# Patient Record
Sex: Male | Born: 1940 | Race: White | Hispanic: No | Smoking: Former smoker
Health system: Southern US, Community
[De-identification: ages and names within clinical notes are randomized; demographics above are authoritative.]

## PROBLEM LIST (undated history)

## (undated) ENCOUNTER — Inpatient Hospital Stay: Admission: EM | Payer: Self-pay | Source: Home / Self Care

## (undated) DIAGNOSIS — J189 Pneumonia, unspecified organism: Secondary | ICD-10-CM

## (undated) DIAGNOSIS — I10 Essential (primary) hypertension: Secondary | ICD-10-CM

## (undated) DIAGNOSIS — K449 Diaphragmatic hernia without obstruction or gangrene: Secondary | ICD-10-CM

## (undated) DIAGNOSIS — Z800 Family history of malignant neoplasm of digestive organs: Secondary | ICD-10-CM

## (undated) DIAGNOSIS — E785 Hyperlipidemia, unspecified: Secondary | ICD-10-CM

## (undated) DIAGNOSIS — I714 Abdominal aortic aneurysm, without rupture, unspecified: Secondary | ICD-10-CM

## (undated) DIAGNOSIS — T7840XA Allergy, unspecified, initial encounter: Secondary | ICD-10-CM

## (undated) DIAGNOSIS — M129 Arthropathy, unspecified: Secondary | ICD-10-CM

## (undated) DIAGNOSIS — F419 Anxiety disorder, unspecified: Secondary | ICD-10-CM

## (undated) DIAGNOSIS — I2510 Atherosclerotic heart disease of native coronary artery without angina pectoris: Secondary | ICD-10-CM

## (undated) DIAGNOSIS — K222 Esophageal obstruction: Secondary | ICD-10-CM

## (undated) DIAGNOSIS — J45909 Unspecified asthma, uncomplicated: Secondary | ICD-10-CM

## (undated) DIAGNOSIS — R911 Solitary pulmonary nodule: Secondary | ICD-10-CM

## (undated) DIAGNOSIS — J449 Chronic obstructive pulmonary disease, unspecified: Secondary | ICD-10-CM

## (undated) DIAGNOSIS — H269 Unspecified cataract: Secondary | ICD-10-CM

## (undated) DIAGNOSIS — I4891 Unspecified atrial fibrillation: Secondary | ICD-10-CM

## (undated) DIAGNOSIS — I739 Peripheral vascular disease, unspecified: Secondary | ICD-10-CM

## (undated) DIAGNOSIS — R011 Cardiac murmur, unspecified: Secondary | ICD-10-CM

## (undated) DIAGNOSIS — I499 Cardiac arrhythmia, unspecified: Secondary | ICD-10-CM

## (undated) DIAGNOSIS — M1990 Unspecified osteoarthritis, unspecified site: Secondary | ICD-10-CM

## (undated) DIAGNOSIS — K219 Gastro-esophageal reflux disease without esophagitis: Secondary | ICD-10-CM

## (undated) DIAGNOSIS — C61 Malignant neoplasm of prostate: Secondary | ICD-10-CM

## (undated) HISTORY — PX: ABDOMINAL AORTA STENT: SHX1108

## (undated) HISTORY — PX: FINGER SURGERY: SHX640

## (undated) HISTORY — DX: Diaphragmatic hernia without obstruction or gangrene: K44.9

## (undated) HISTORY — PX: UPPER GASTROINTESTINAL ENDOSCOPY: SHX188

## (undated) HISTORY — DX: Abdominal aortic aneurysm, without rupture: I71.4

## (undated) HISTORY — DX: Family history of malignant neoplasm of digestive organs: Z80.0

## (undated) HISTORY — DX: Gastro-esophageal reflux disease without esophagitis: K21.9

## (undated) HISTORY — DX: Peripheral vascular disease, unspecified: I73.9

## (undated) HISTORY — DX: Esophageal obstruction: K22.2

## (undated) HISTORY — PX: CARDIAC CATHETERIZATION: SHX172

## (undated) HISTORY — DX: Malignant neoplasm of prostate: C61

## (undated) HISTORY — DX: Atherosclerotic heart disease of native coronary artery without angina pectoris: I25.10

## (undated) HISTORY — DX: Hyperlipidemia, unspecified: E78.5

## (undated) HISTORY — DX: Unspecified osteoarthritis, unspecified site: M19.90

## (undated) HISTORY — DX: Anxiety disorder, unspecified: F41.9

## (undated) HISTORY — DX: Chronic obstructive pulmonary disease, unspecified: J44.9

## (undated) HISTORY — DX: Allergy, unspecified, initial encounter: T78.40XA

## (undated) HISTORY — DX: Unspecified asthma, uncomplicated: J45.909

## (undated) HISTORY — DX: Essential (primary) hypertension: I10

## (undated) HISTORY — DX: Cardiac murmur, unspecified: R01.1

## (undated) HISTORY — DX: Unspecified cataract: H26.9

## (undated) HISTORY — DX: Unspecified atrial fibrillation: I48.91

## (undated) HISTORY — PX: COLONOSCOPY: SHX174

## (undated) HISTORY — PX: INSERTION PROSTATE RADIATION SEED: SUR718

## (undated) HISTORY — DX: Abdominal aortic aneurysm, without rupture, unspecified: I71.40

## (undated) HISTORY — DX: Arthropathy, unspecified: M12.9

---

## 1999-07-27 ENCOUNTER — Encounter: Payer: Self-pay | Admitting: Emergency Medicine

## 1999-07-27 ENCOUNTER — Emergency Department (HOSPITAL_COMMUNITY): Admission: EM | Admit: 1999-07-27 | Discharge: 1999-07-27 | Payer: Self-pay | Admitting: Emergency Medicine

## 1999-09-23 ENCOUNTER — Ambulatory Visit (HOSPITAL_BASED_OUTPATIENT_CLINIC_OR_DEPARTMENT_OTHER): Admission: RE | Admit: 1999-09-23 | Discharge: 1999-09-23 | Payer: Self-pay | Admitting: Orthopedic Surgery

## 1999-09-23 ENCOUNTER — Encounter: Payer: Self-pay | Admitting: Emergency Medicine

## 2004-07-19 ENCOUNTER — Emergency Department (HOSPITAL_COMMUNITY): Admission: EM | Admit: 2004-07-19 | Discharge: 2004-07-19 | Payer: Self-pay | Admitting: *Deleted

## 2004-07-19 IMAGING — CR DG CHEST 2V
2 series · 2 of 2 positions shown · non-contrast
Comparison: none

CLINICAL DATA: Prostate carcinoma.  Chest and neck pain.  
 CHEST - TWO VIEWS: 
 The heart size and mediastinal contours are normal.  The lungs are clear.  The visualized skeleton is unremarkable.

[view not recorded (1 of 2)]
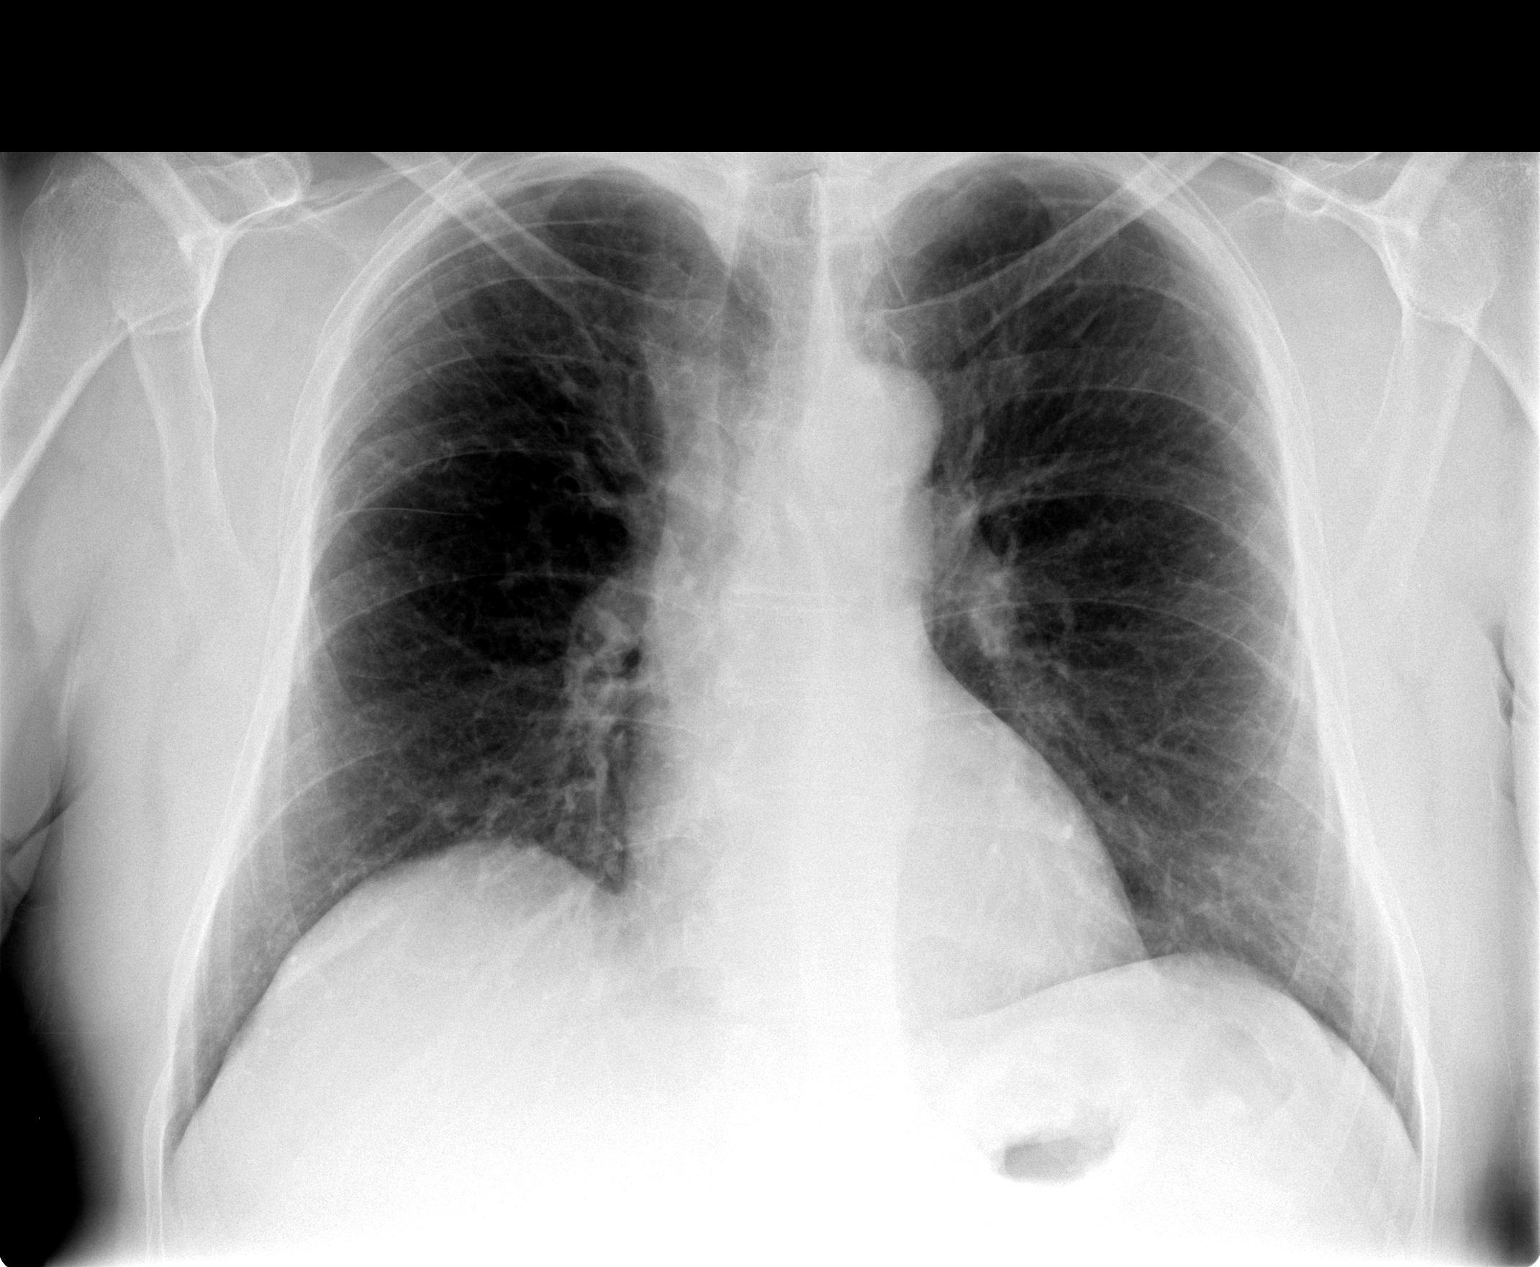

[view not recorded (2 of 2)]
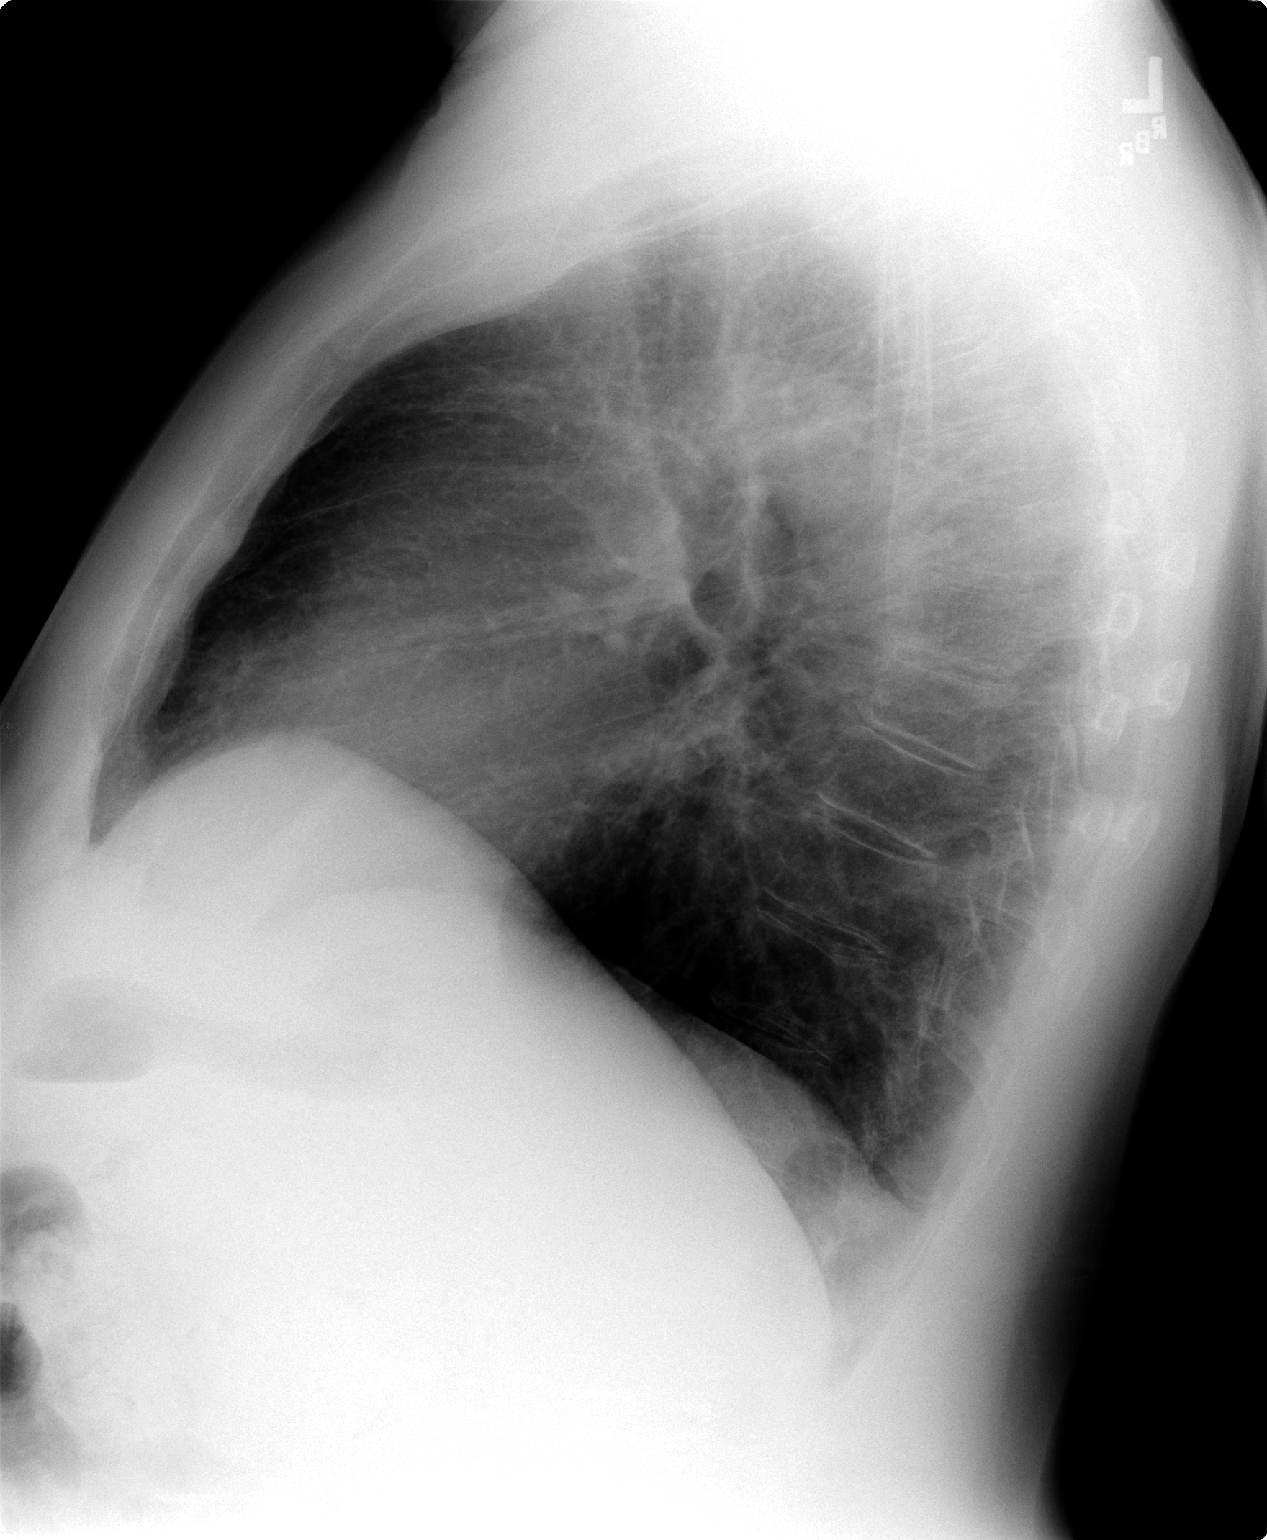

[2 of 2 positions shown; findings below may reference images not displayed]

IMPRESSION: No active disease.

## 2004-07-19 IMAGING — CT CT NECK W/ CM
1 series · 12 of 14 positions shown, 15 images · IV contrast (omnipaque)
Comparison: none

CLINICAL DATA: Left neck swelling and pain in the left parotid area.  Prostate carcinoma. 
 NECK CT WITH CONTRAST:
TECHNIQUE: Routine multidetector CT imaging of the neck was performed during administration of 100 cc Omnipaque 300 intravenous contrast.  
 The parotid glands and other salivary glands are normal in appearance.  There is no evidence of abnormal soft tissue masses.  No lymphadenopathy is identified within the neck.  There is no evidence of inflammatory process.  
 The laryngeal structures are normal in appearance as are the parapharyngeal soft tissues.  The thyroid gland is also normal in appearance.

[Series 2: soft tissue neck · axial · 0.45mm/px · z∈[-277,-59]mm · 12 of 70 slices shown, 15 images]
[im 6/70  soft-tissue]
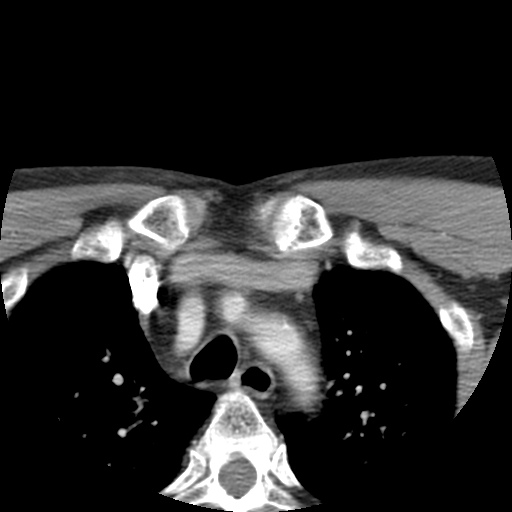
[im 6/70  bone]
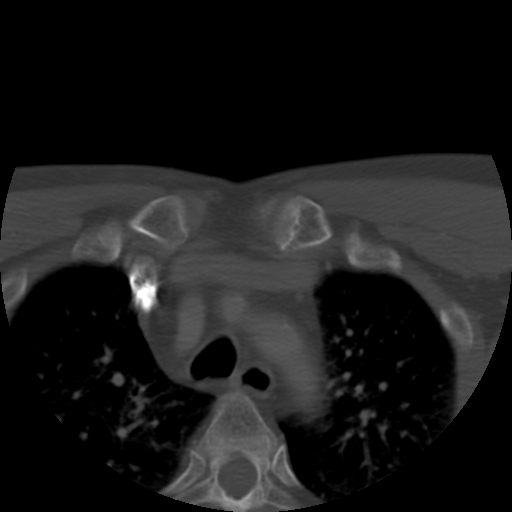
[im 11/70  bone]
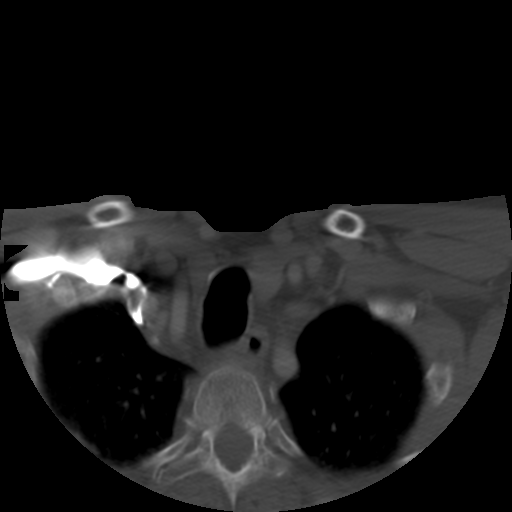
[im 16/70  bone]
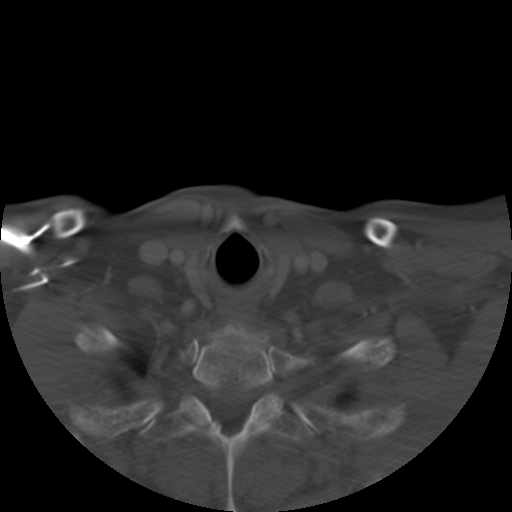
[im 22/70  bone]
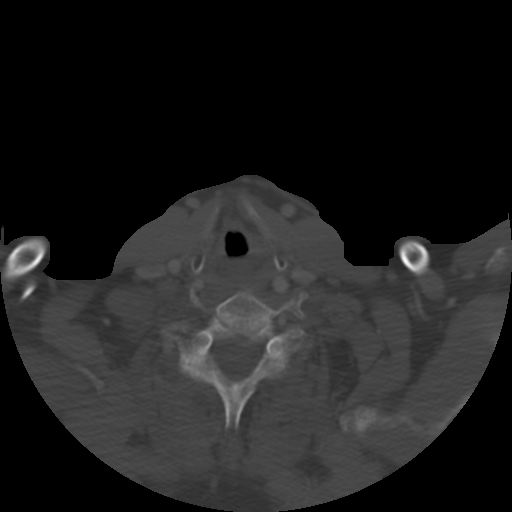
[im 27/70  soft-tissue]
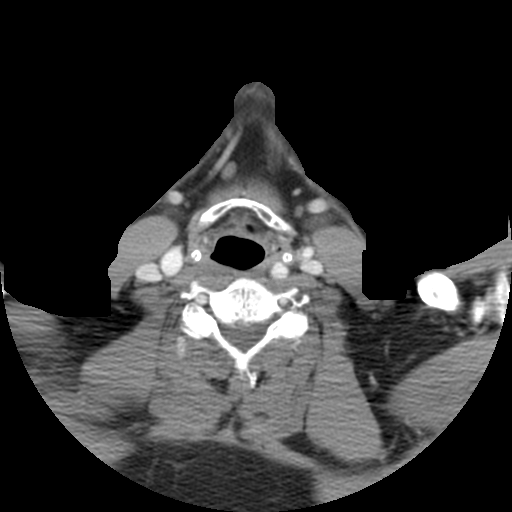
[im 27/70  bone]
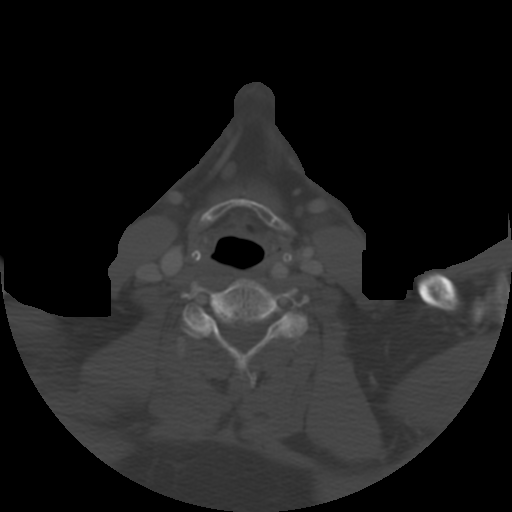
[im 32/70  bone]
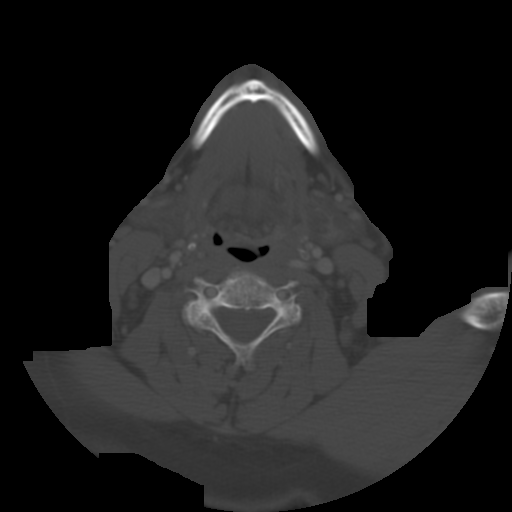
[im 38/70  bone]
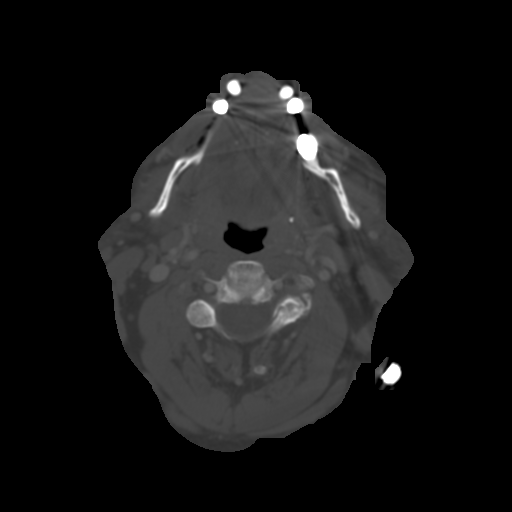
[im 43/70  bone]
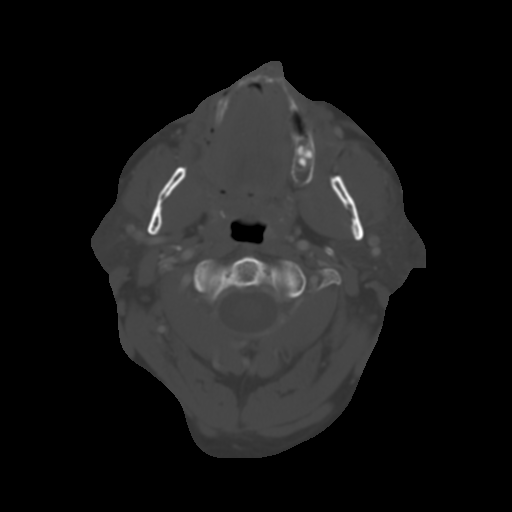
[im 48/70  soft-tissue]
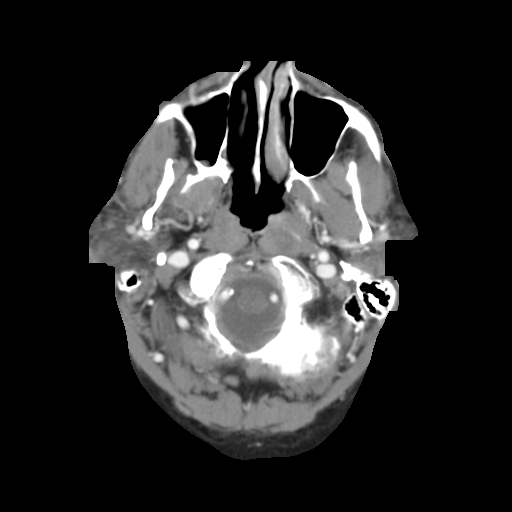
[im 48/70  bone]
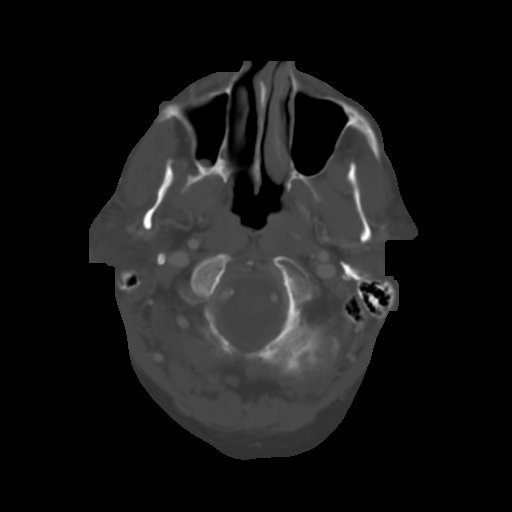
[im 54/70  bone]
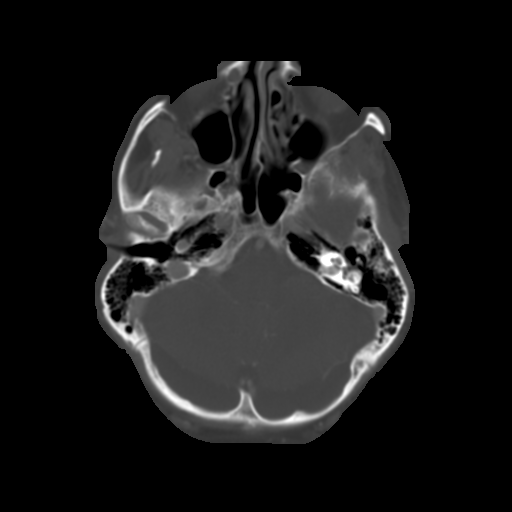
[im 59/70  bone]
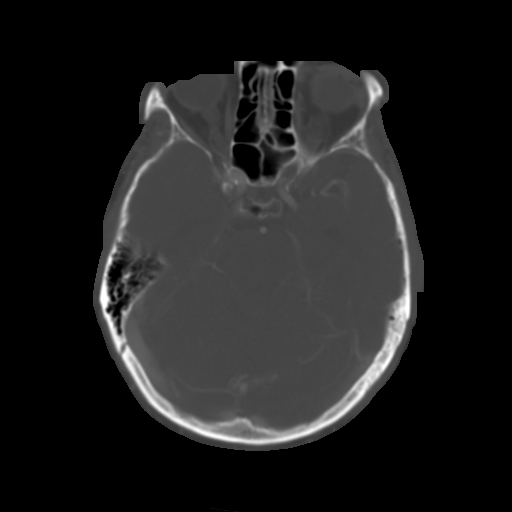
[im 64/70  bone]
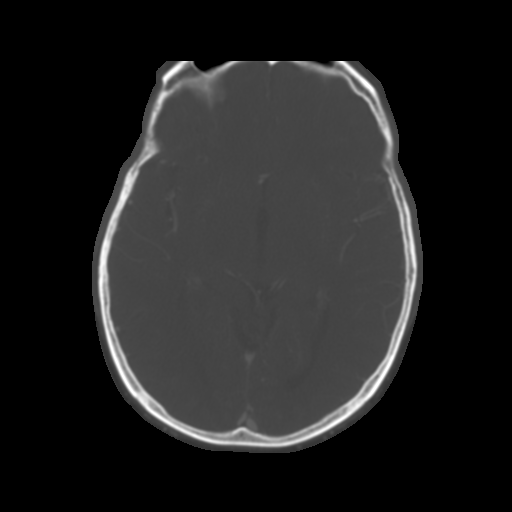

[12 of 14 positions shown; findings below may reference images not displayed]

IMPRESSION: Negative neck CT.  No evidence of mass or adenopathy.

## 2004-07-24 ENCOUNTER — Other Ambulatory Visit: Admission: RE | Admit: 2004-07-24 | Discharge: 2004-07-24 | Payer: Self-pay | Admitting: *Deleted

## 2004-07-25 ENCOUNTER — Ambulatory Visit: Admission: RE | Admit: 2004-07-25 | Discharge: 2004-10-16 | Payer: Self-pay | Admitting: Radiation Oncology

## 2004-09-08 ENCOUNTER — Ambulatory Visit (HOSPITAL_COMMUNITY): Admission: RE | Admit: 2004-09-08 | Discharge: 2004-09-08 | Payer: Self-pay | Admitting: Urology

## 2004-09-08 ENCOUNTER — Ambulatory Visit (HOSPITAL_BASED_OUTPATIENT_CLINIC_OR_DEPARTMENT_OTHER): Admission: RE | Admit: 2004-09-08 | Discharge: 2004-09-08 | Payer: Self-pay | Admitting: Urology

## 2005-01-06 ENCOUNTER — Ambulatory Visit: Payer: Self-pay | Admitting: Gastroenterology

## 2005-02-17 ENCOUNTER — Ambulatory Visit: Payer: Self-pay | Admitting: Gastroenterology

## 2005-03-16 ENCOUNTER — Ambulatory Visit: Payer: Self-pay | Admitting: Gastroenterology

## 2006-01-21 ENCOUNTER — Ambulatory Visit: Payer: Self-pay | Admitting: Gastroenterology

## 2006-01-22 ENCOUNTER — Ambulatory Visit: Payer: Self-pay | Admitting: Gastroenterology

## 2006-01-22 IMAGING — US US ABDOMEN COMPLETE
1 series · 14 of 25 positions shown · non-contrast
Comparison: none

ACCESSION NUMBER:  [PHONE_NUMBER].

ORDERING PHYSICIAN:  THOLENTZ, M.D.
READING PHYSICIAN:  THOLENTZ, M.D.
RESULTS:  Abdominal aorta measures 3.6 cm in maximal diameter with aneurysmal dilatation in the mid portion. The abdominal aorta appears normal.  The IVC is patent. 
The pancreas is not imaged due to overlying gas. 
Gallbladder is well distended, wall thickness 2.2 mm, with no pericholecystic fluid or intraluminal echogenic foci to suggest gallstone disease. The common bile duct measures 3.9 mm in maximal diameter and appears normal without evidence of intraluminal foci. 
The liver appears normal without evidence of parenchymal lesion, ductal dilatation or vascular abnormality. It has a moderate increase in echo-density. 
Kidneys measure: 9.3 cm right, 12 cm left.  Both kidneys are normal in appearance. 
The spleen is normal in size, measuring 11.2 cm without parenchymal lesion.

[Series 1: us abdomen complete · 0.24mm/px · 14 of 47 slices shown]
[im 1/47]
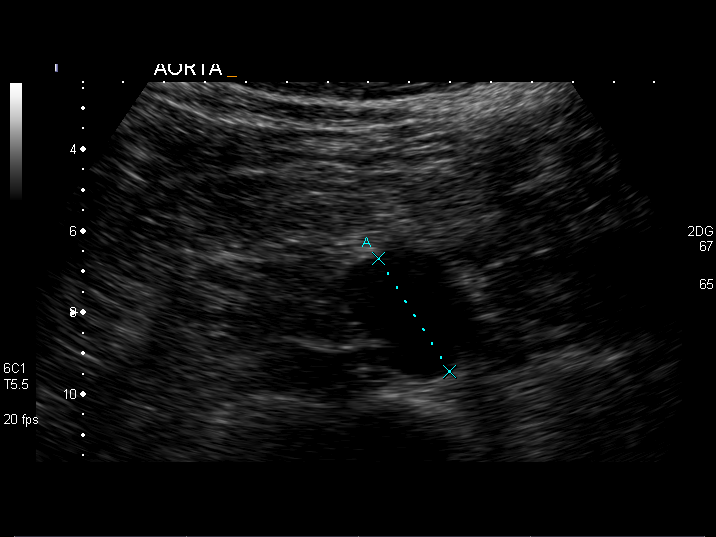
[im 4/47]
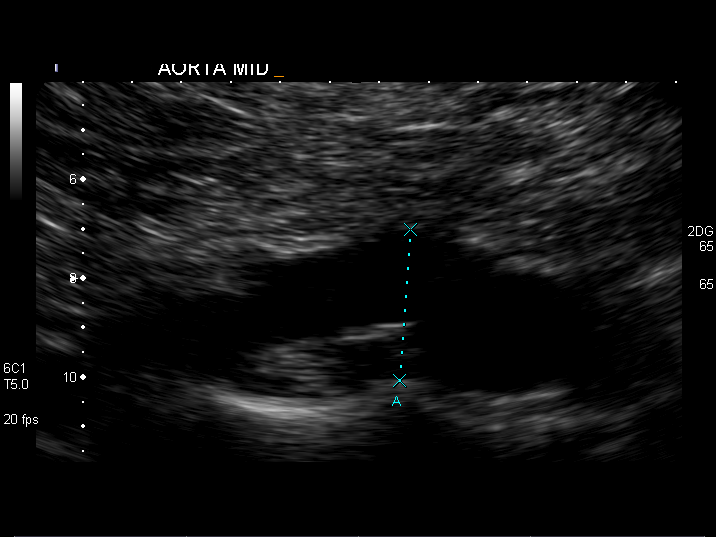
[im 8/47]
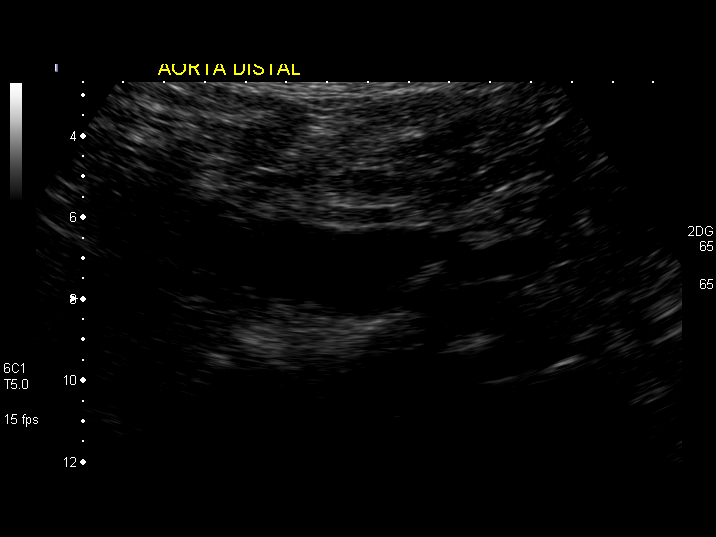
[im 12/47]
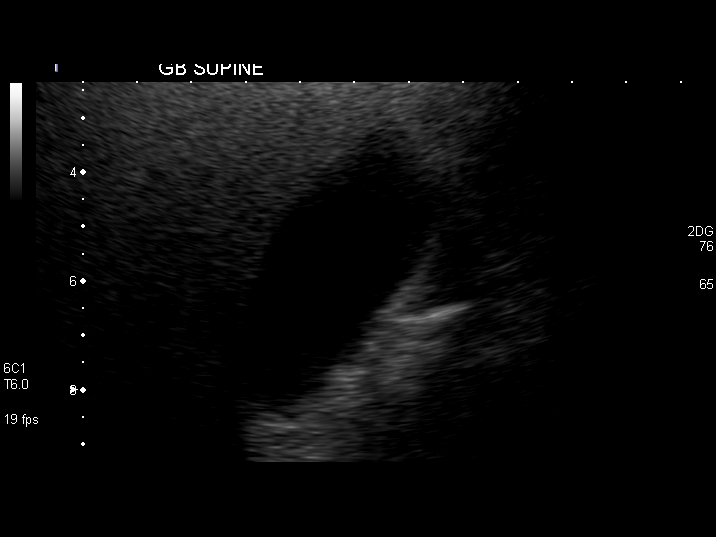
[im 16/47]
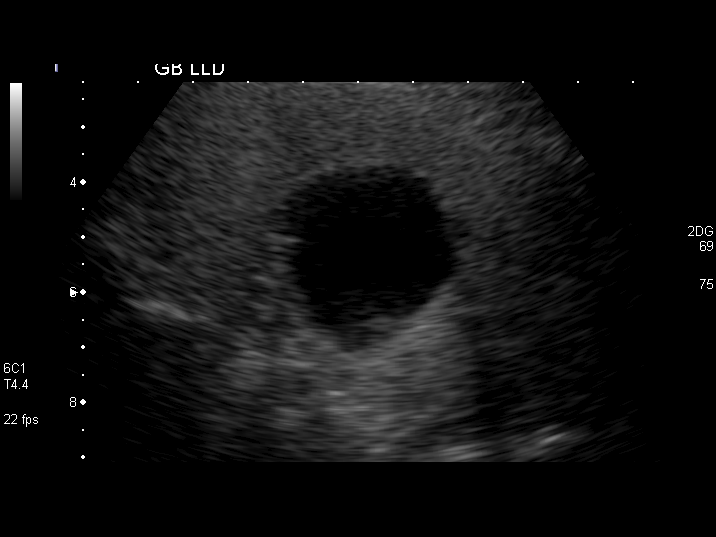
[im 18/47]
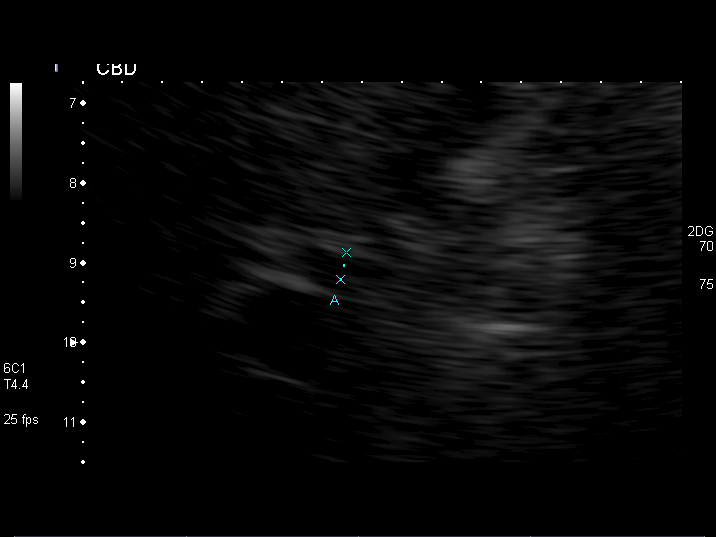
[im 22/47]
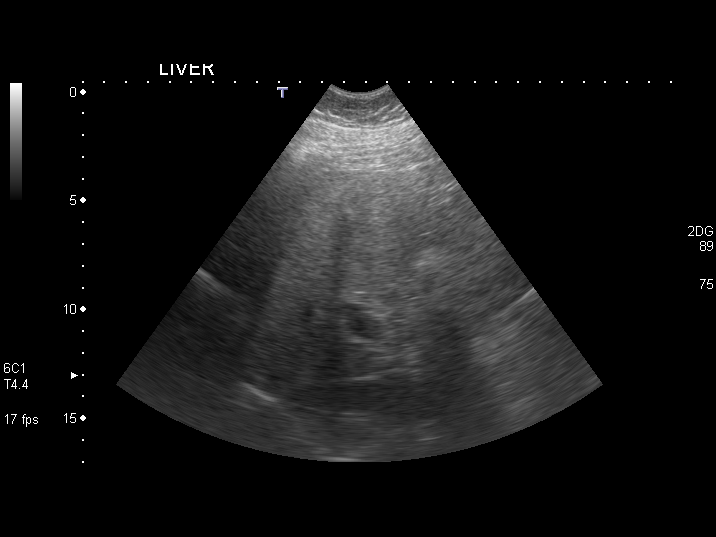
[im 25/47]
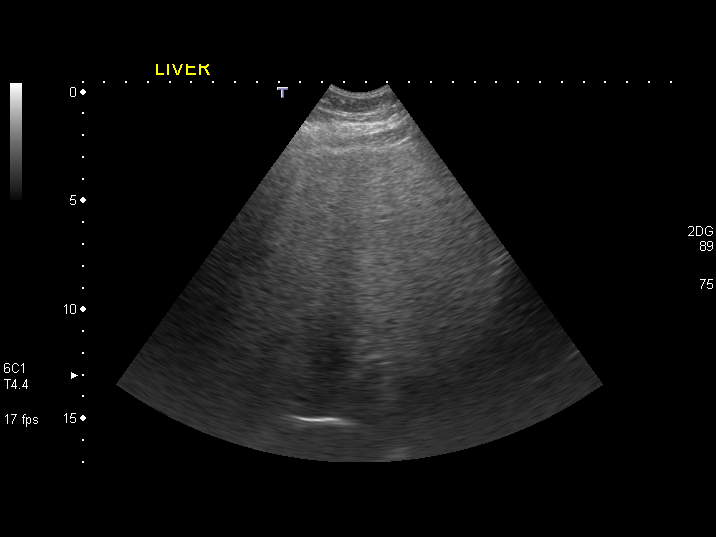
[im 29/47]
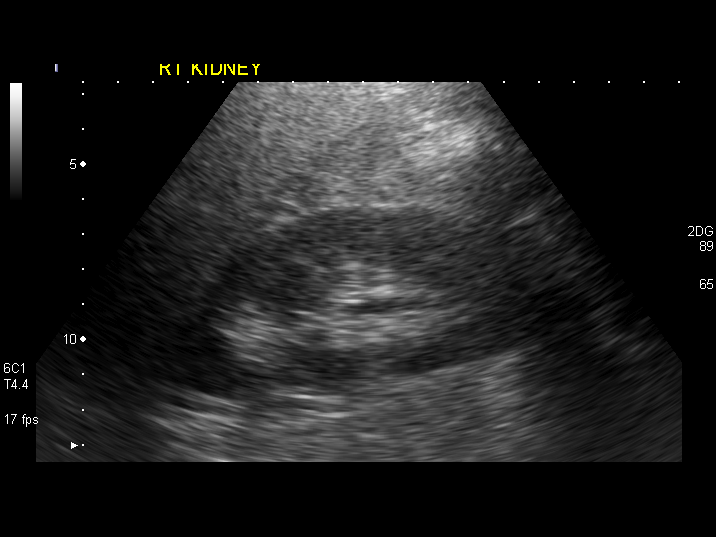
[im 31/47]
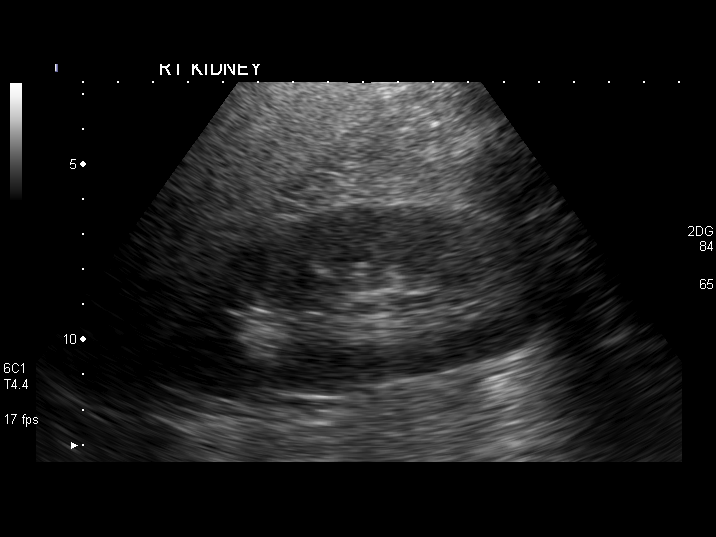
[im 35/47]
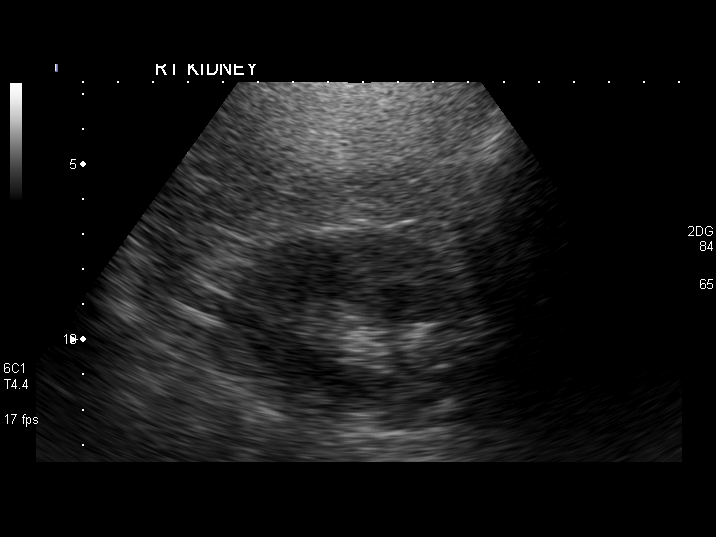
[im 39/47]
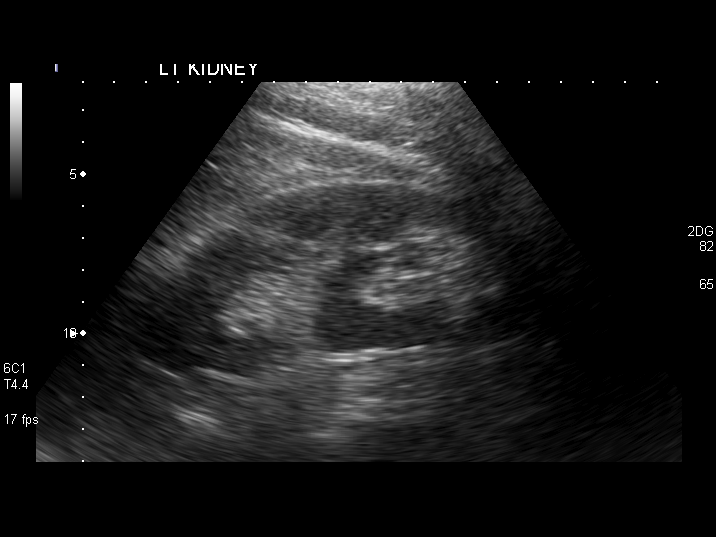
[im 43/47]
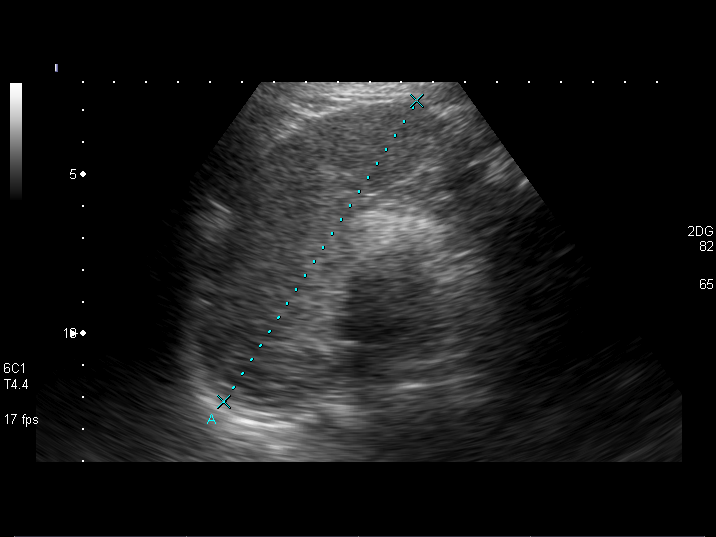
[im 47/47]
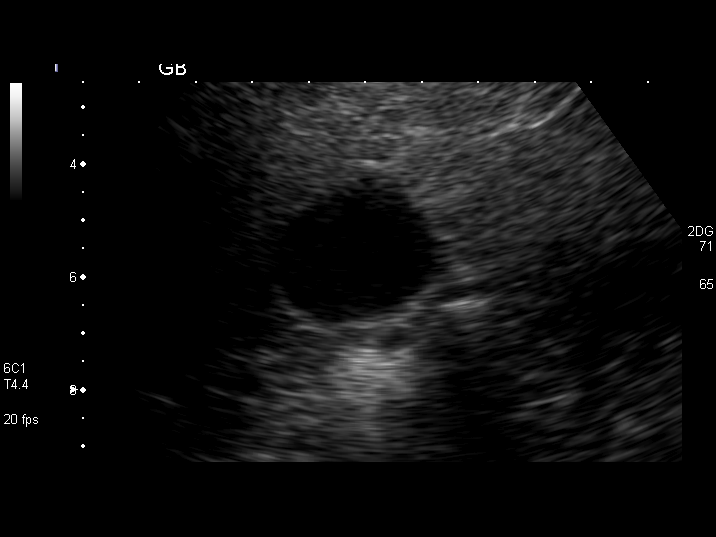

[14 of 25 positions shown; findings below may reference images not displayed]

IMPRESSION: 1.        Abdominal aortic aneurysm, measuring 3.6 cm.
2.        Moderately increased hepatic echodensity.
3.        Pancreas not imaged due to overlying gas.

## 2006-02-09 ENCOUNTER — Encounter: Admission: RE | Admit: 2006-02-09 | Discharge: 2006-02-09 | Payer: Self-pay | Admitting: Family Medicine

## 2006-02-09 IMAGING — US US AORTA
1 series · 14 of 25 positions shown · non-contrast
Comparison: [DATE].

CLINICAL DATA: Abdominal aortic aneurysm demonstrated at [facility] on [DATE]. 
 AORTIC ULTRASOUND:
TECHNIQUE: Complete retroperitoneal ultrasound examination was performed including evaluation of the abdominal aorta, IVC, common iliac vessel origins, and kidneys.

[Series 1: us aorta · 0.29mm/px · 14 of 46 slices shown]
[im 1/46]
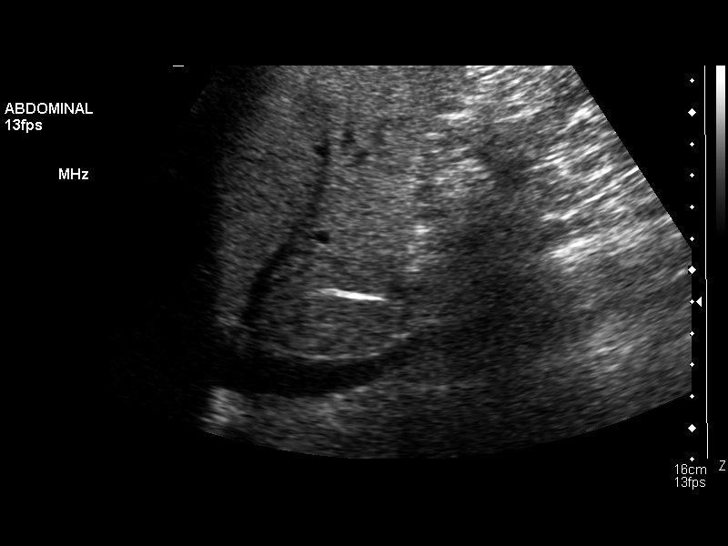
[im 4/46]
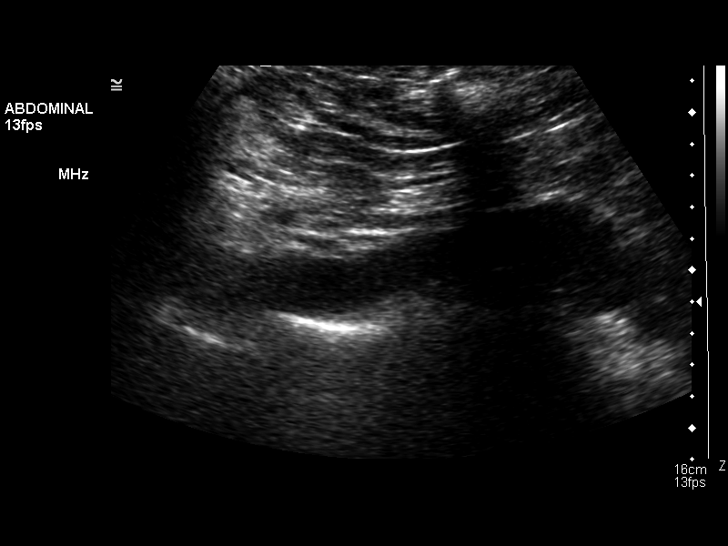
[im 8/46]
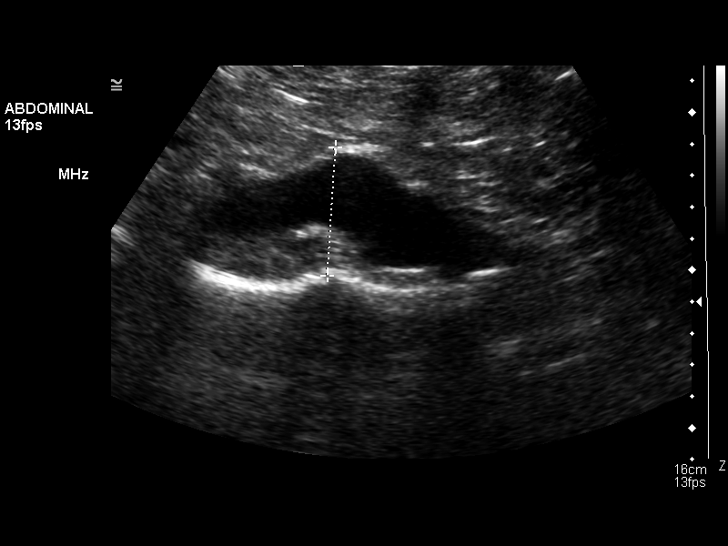
[im 12/46]
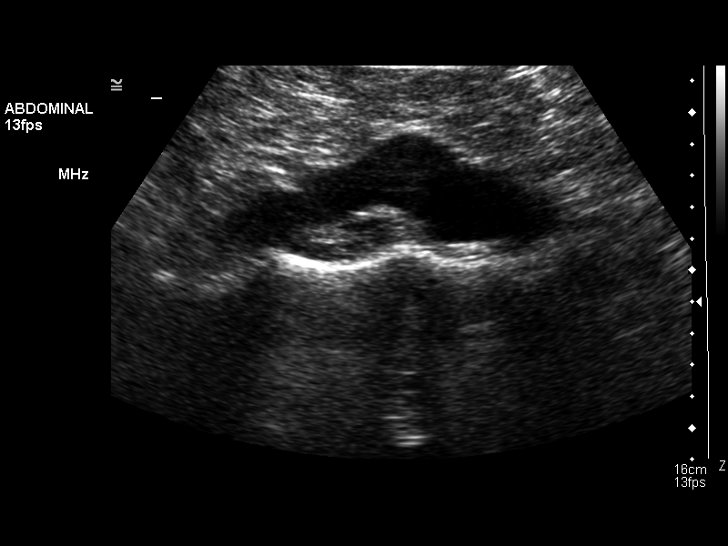
[im 16/46]
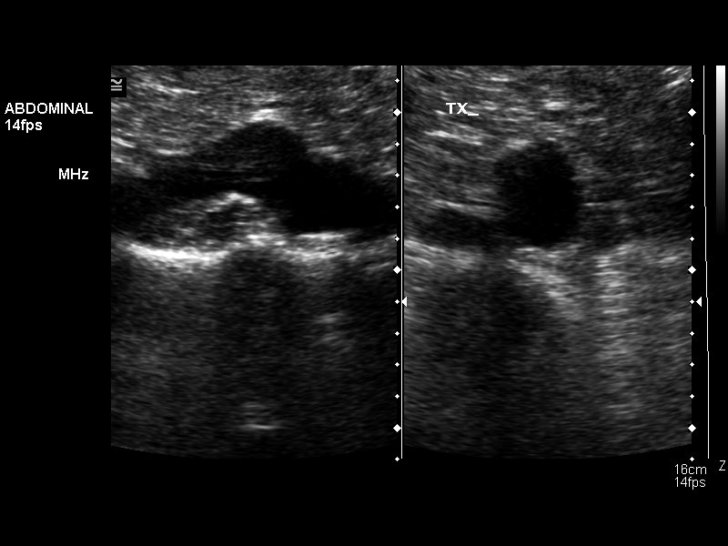
[im 17/46]
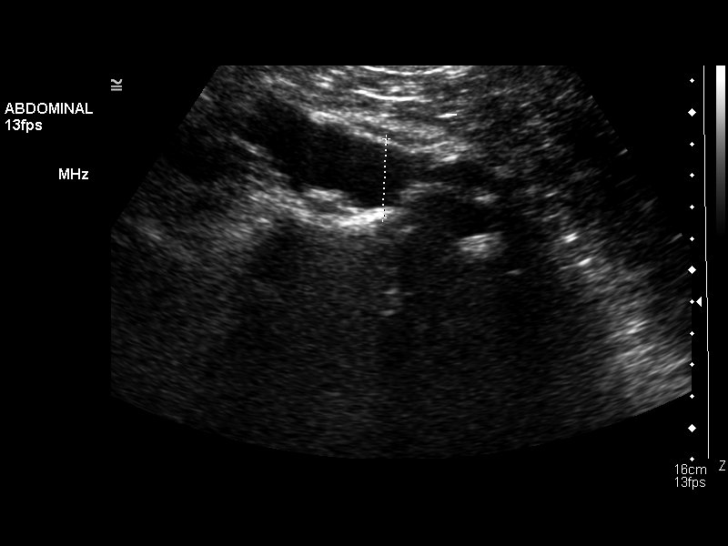
[im 21/46]
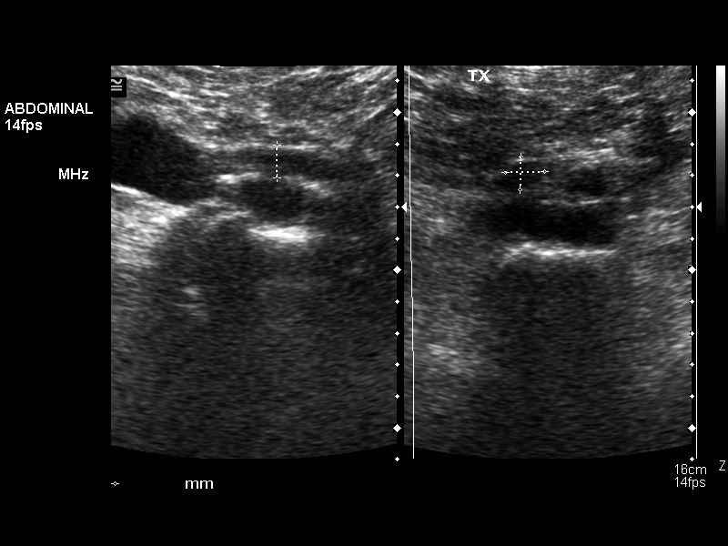
[im 25/46]
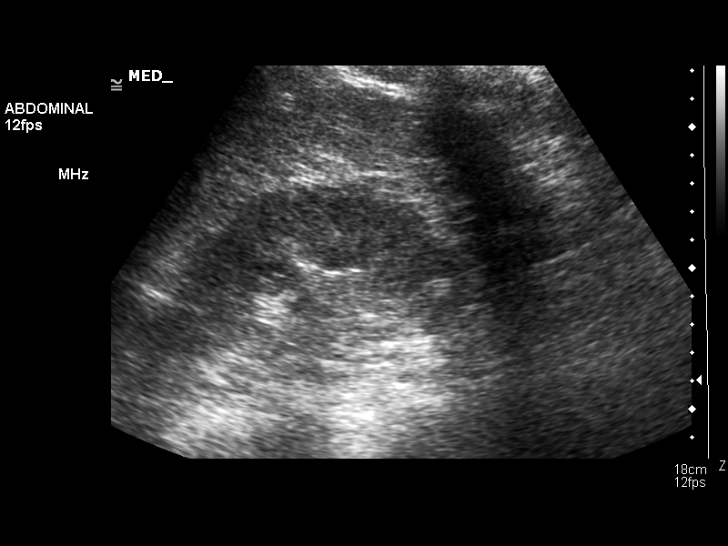
[im 29/46]
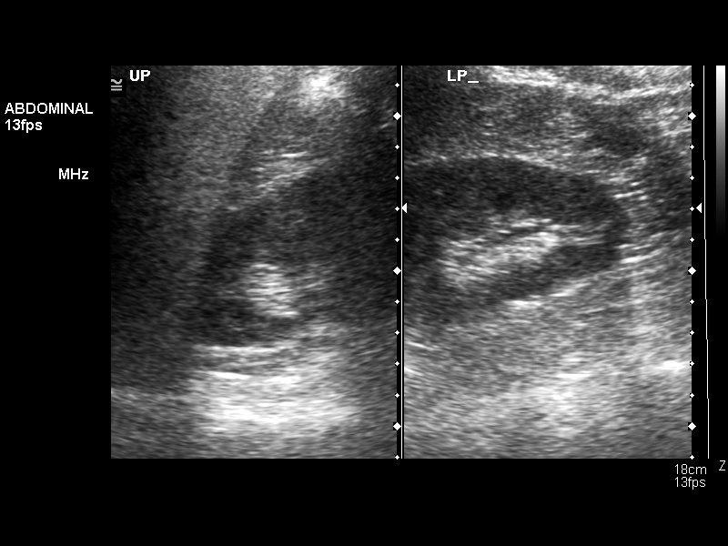
[im 31/46]
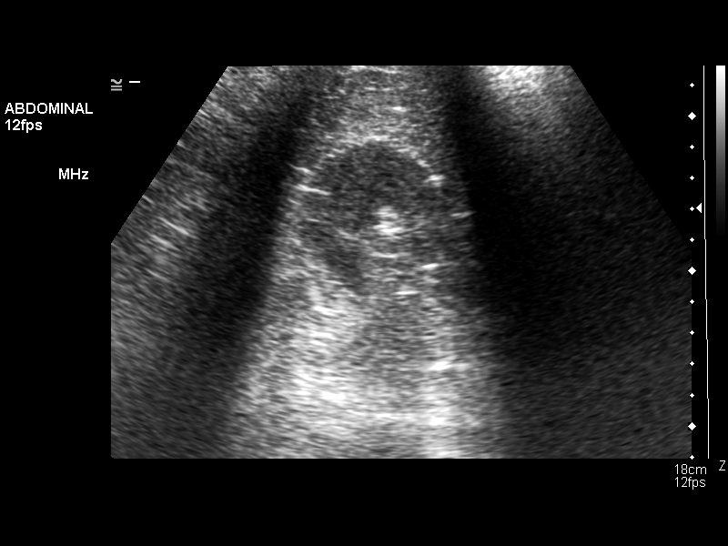
[im 34/46]
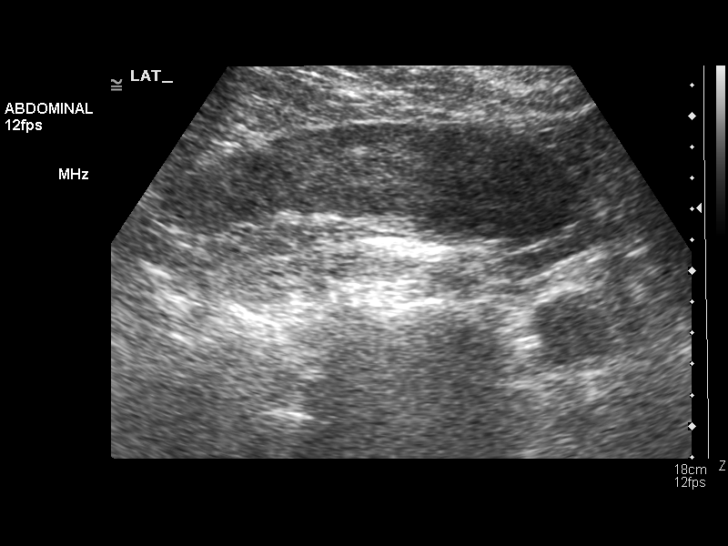
[im 38/46]
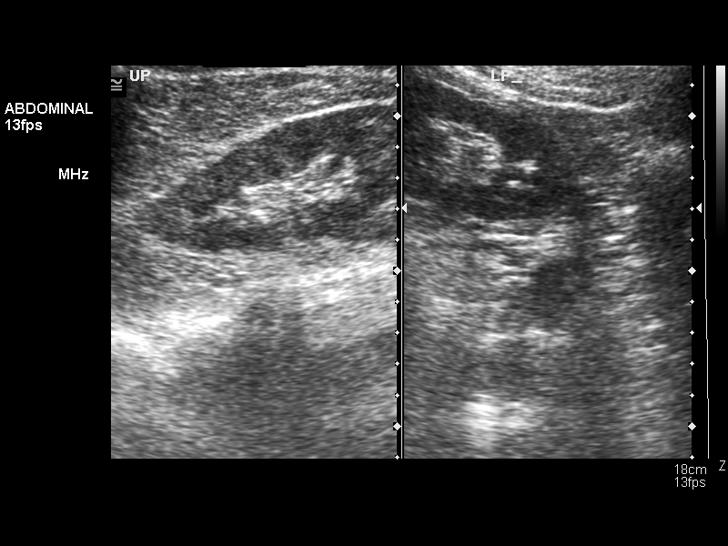
[im 42/46]
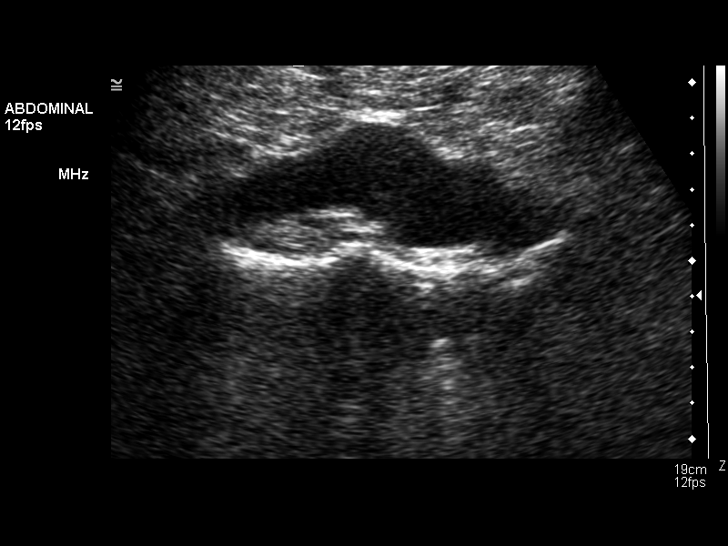
[im 46/46]
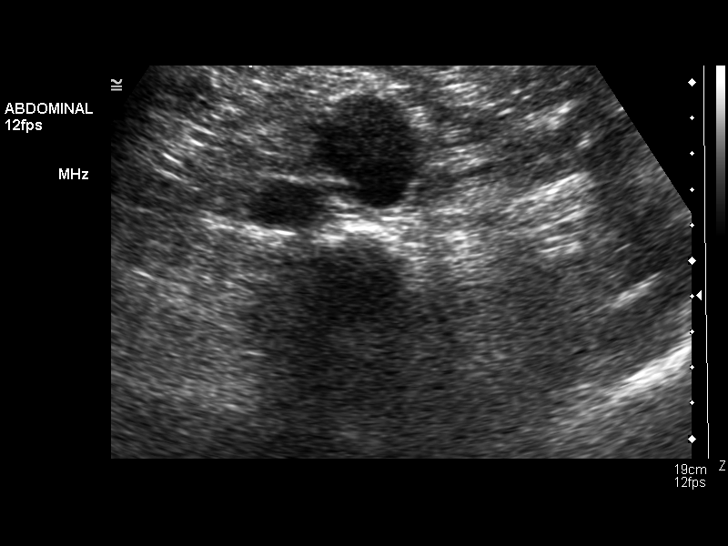

[14 of 25 positions shown; findings below may reference images not displayed]

FINDINGS: The IVC is normal.  The right kidney measures 11.6 and the left kidney measures 13.8cm in craniocaudal span.  No hydronephrosis or mass.  
 Within the mid to distal abdominal aorta, there is aneurysmal dilatation.  In greatest dimension, this measures approximately 4.1cm.  In transverse dimension, it measures 3.0 x 4.1.  There is extensive posterior mural thrombus.  The aneurysm extends for an approximately 7cm craniocaudal length.  In the area of thrombus, the residual lumen measures approximately 2.2cm.  
 The common iliac arteries are not involved.  The right common iliac measures 1.2cm and the left common iliac measures 1.4cm.  There is no sonographic evidence of para-aortic hemorrhage.
IMPRESSION: 1.  Abdominal aortic aneurysm measuring maximally 4.1cm.  This is in the mid to distal abdominal aorta and likely centered infrarenally.  
 2.  No iliac extension.

## 2006-09-17 ENCOUNTER — Ambulatory Visit: Payer: Self-pay | Admitting: Vascular Surgery

## 2007-03-02 ENCOUNTER — Ambulatory Visit: Payer: Self-pay | Admitting: Vascular Surgery

## 2008-01-02 ENCOUNTER — Encounter: Payer: Self-pay | Admitting: Gastroenterology

## 2008-02-22 ENCOUNTER — Ambulatory Visit: Payer: Self-pay | Admitting: Vascular Surgery

## 2008-04-19 ENCOUNTER — Telehealth: Payer: Self-pay | Admitting: Gastroenterology

## 2008-04-27 ENCOUNTER — Telehealth: Payer: Self-pay | Admitting: Gastroenterology

## 2008-04-30 ENCOUNTER — Ambulatory Visit: Payer: Self-pay | Admitting: Gastroenterology

## 2008-04-30 DIAGNOSIS — C61 Malignant neoplasm of prostate: Secondary | ICD-10-CM | POA: Insufficient documentation

## 2008-04-30 DIAGNOSIS — K219 Gastro-esophageal reflux disease without esophagitis: Secondary | ICD-10-CM

## 2008-04-30 DIAGNOSIS — T7840XA Allergy, unspecified, initial encounter: Secondary | ICD-10-CM | POA: Insufficient documentation

## 2008-04-30 DIAGNOSIS — K449 Diaphragmatic hernia without obstruction or gangrene: Secondary | ICD-10-CM | POA: Insufficient documentation

## 2008-04-30 DIAGNOSIS — M129 Arthropathy, unspecified: Secondary | ICD-10-CM | POA: Insufficient documentation

## 2008-04-30 DIAGNOSIS — K222 Esophageal obstruction: Secondary | ICD-10-CM

## 2009-03-15 ENCOUNTER — Ambulatory Visit: Payer: Self-pay | Admitting: Vascular Surgery

## 2009-03-20 ENCOUNTER — Ambulatory Visit: Payer: Self-pay | Admitting: Vascular Surgery

## 2009-03-27 ENCOUNTER — Ambulatory Visit: Payer: Self-pay | Admitting: Vascular Surgery

## 2009-03-27 ENCOUNTER — Encounter: Admission: RE | Admit: 2009-03-27 | Discharge: 2009-03-27 | Payer: Self-pay | Admitting: Vascular Surgery

## 2009-03-27 IMAGING — CT CT ANGIO ABDOMEN
2 of 5 series · 12 of 36 positions shown, 18 images · IV contrast ([ID] OMNI 300)
Comparison: None.

CTA ABDOMEN

CLINICAL DATA: PRE STENT AAA.

CT ANGIOGRAPHY OF ABDOMEN AND PELVIS - PRESTENT PROTOCOL
TECHNIQUE: Multidetector CT imaging of the abdomen and pelvis was
performed during bolus injection of intravenous contrast.
Multiplanar CT angiographic image reconstructions including MIPs
were also generated to evaluate the vascular anatomy.
Contrast: 100 ml [N0]

[Series 5: angio · axial · 0.82mm/px · z∈[-360,-2]mm · 11 of 170 slices shown, 16 images]
[im 14/170  soft-tissue]
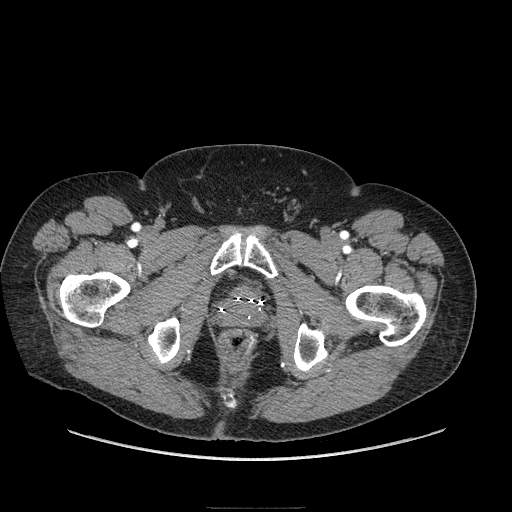
[im 14/170  bone]
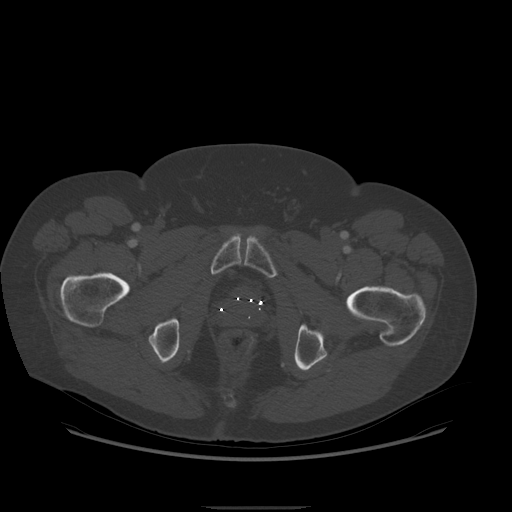
[im 27/170  soft-tissue]
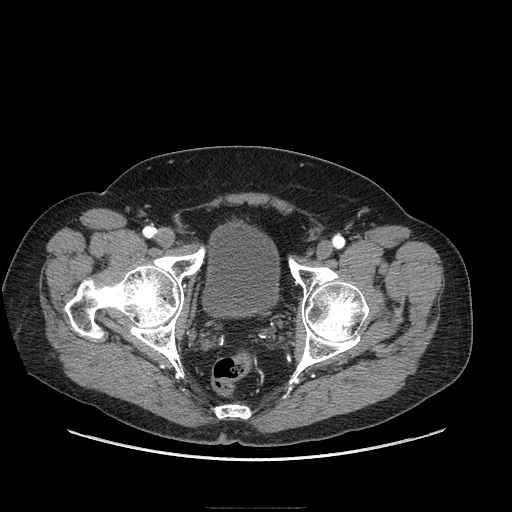
[im 53/170  soft-tissue]
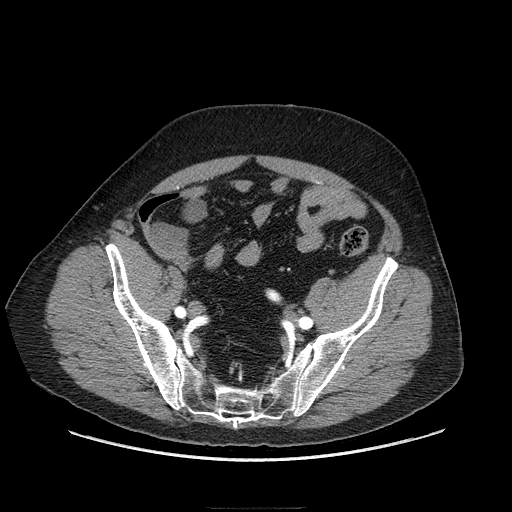
[im 66/170  soft-tissue]
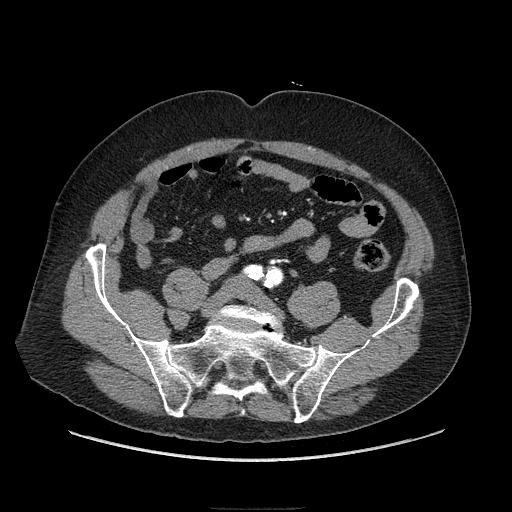
[im 79/170  soft-tissue]
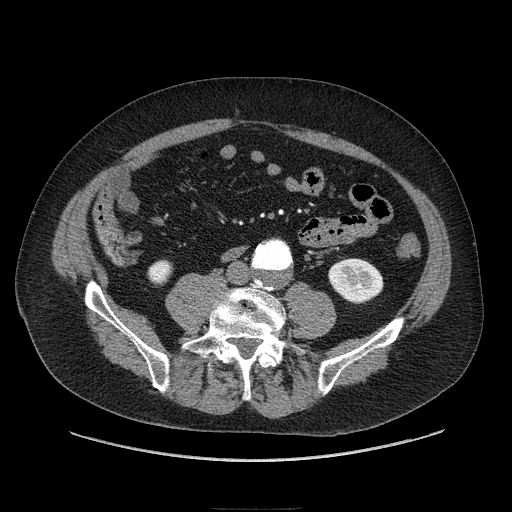
[im 92/170  soft-tissue]
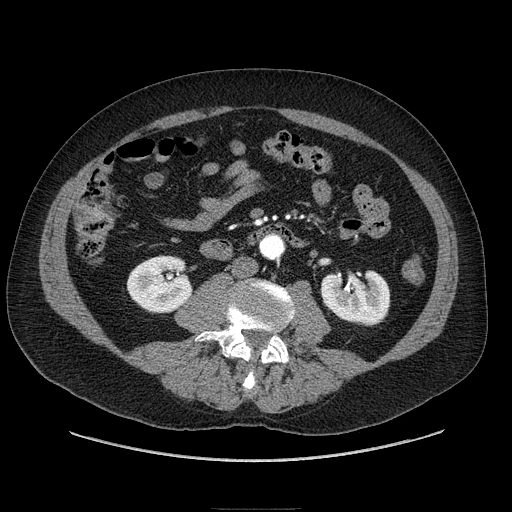
[im 105/170  soft-tissue]
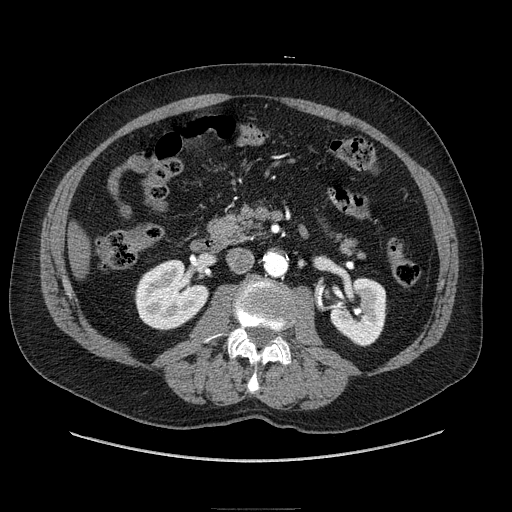
[im 118/170  lung]
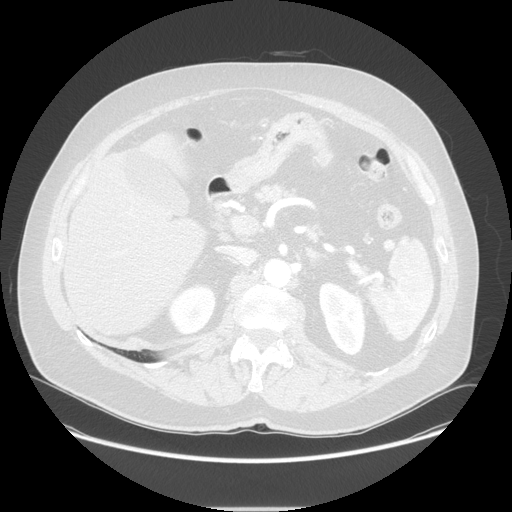
[im 131/170  soft-tissue]
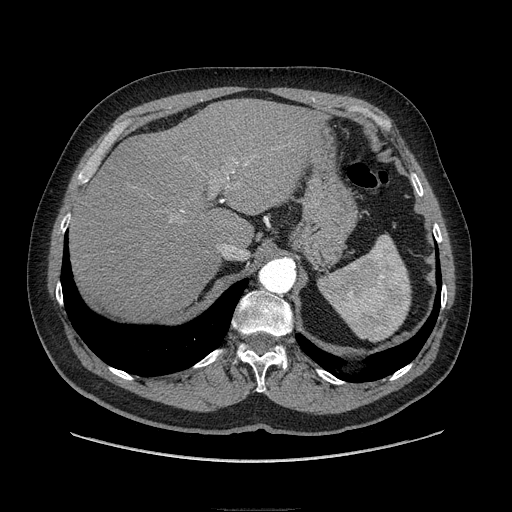
[im 131/170  lung]
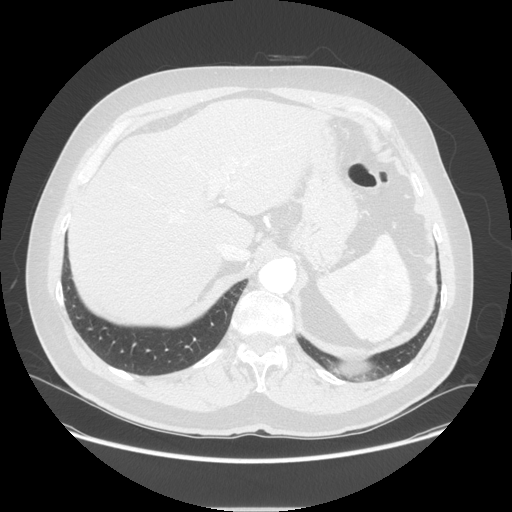
[im 144/170  soft-tissue]
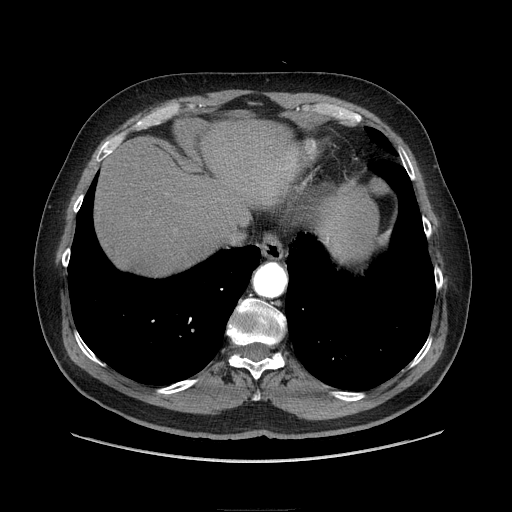
[im 144/170  lung]
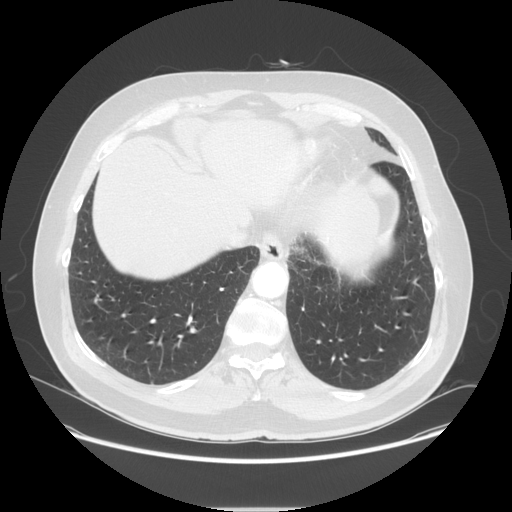
[im 144/170  bone]
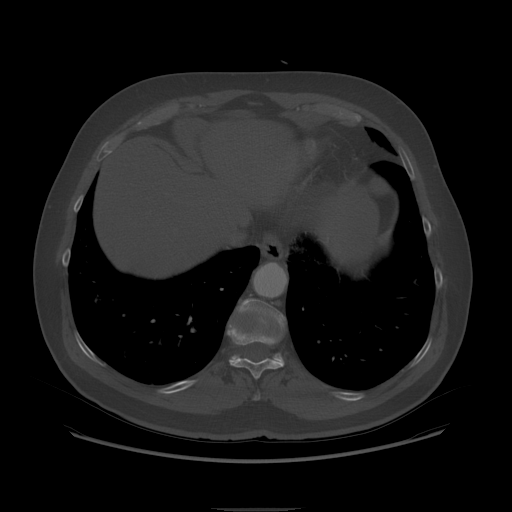
[im 157/170  soft-tissue]
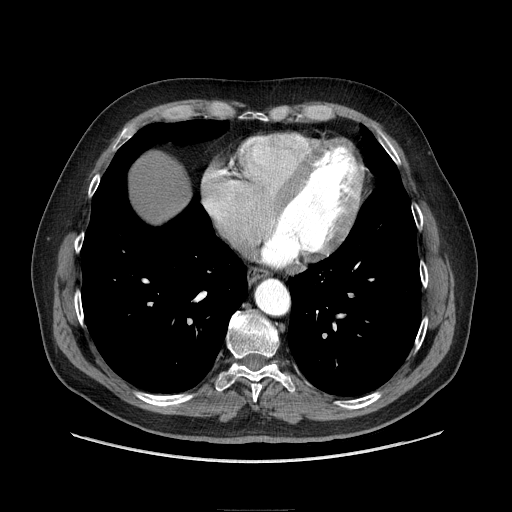
[im 157/170  lung]
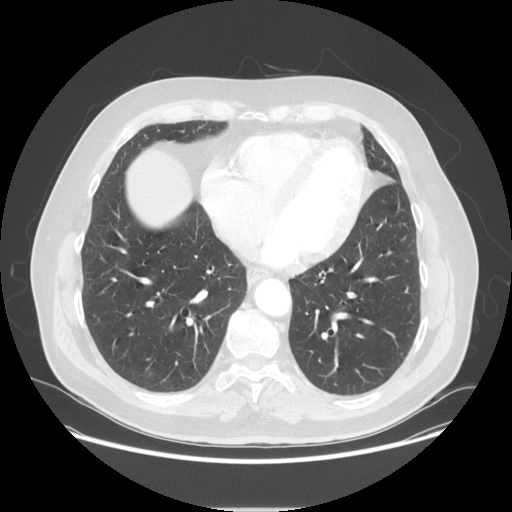

[Series 601: coronal body · coronal · 0.89mm/px · 1 of 131 slices shown, 2 images]
[im 44/131  soft-tissue]
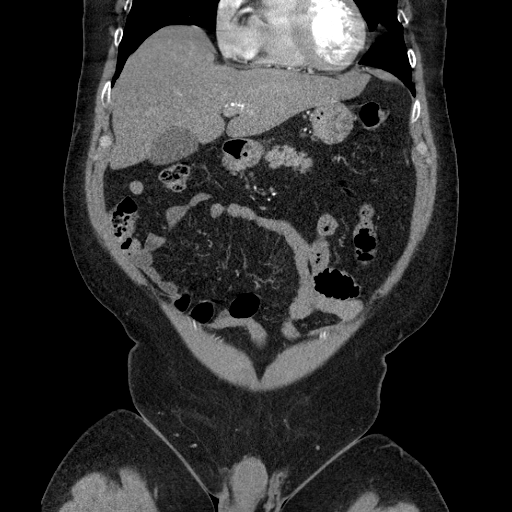
[im 44/131  bone]
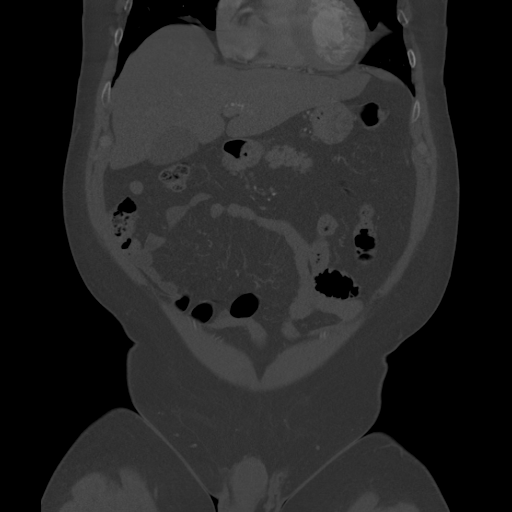

[12 of 36 positions shown; findings below may reference images not displayed]

FINDINGS: Lung bases are clear.  Heart size within normal limits.
No pericardial or pleural effusion.  Small pre pericardiac lymph
nodes.

Liver, gallbladder and right adrenal gland are unremarkable.  A
x 2.0 cm nodule in the left adrenal gland measures 42 HU.  Sub
centimeter low attenuation lesions in the kidneys are too small to
characterize.  However, statistically, these likely represent
cysts.  Spleen, pancreas, stomach and small bowel are otherwise
unremarkable.  No pathologically enlarged lymph nodes.  No free
fluid.

There is an infrarenal aortic aneurysm with eccentric mural
thrombus.

Length of infrarenal neck (from lowest renal artery): 63 mm
Number of renal arteries:  Right = 1; Left = 1
Diameter of infrarenal neck:  23 mm
Total length of aneurysm:  60 mm
Aneurysm ends at aortic bifurcation: Y  If no, Distance from
aneurysm to bifurcation:
Greatest aneurysm diameter:  40 mmGreatest common iliac artery
diameters:  Right =15 mm; Left = 13 mm
Diameter of common iliac arteries just above iliac bifurcation:
Right = 15 mm; Left = 13 mm
Length of common iliac arteries:  Right = 44 mm; Left = 27 mm

 Review of the MIP images confirms the above findings.
IMPRESSION: 1.  Infrarenal aortic aneurysm.
2.  Left adrenal nodule is likely an adenoma.  Given the slight
increase in size from report of CT [DATE], a collision tumor
cannot be definitively excluded.

CTA PELVIS
FINDINGS: Brachytherapy seeds are seen in the prostate.  There may
be mild rectal wall thickening.  Colon, appendix and bladder are
unremarkable.  No pathologically enlarged lymph nodes.  No free
fluid.  No worrisome lytic or sclerotic lesions.  Degenerative
changes are seen in the spine.

 Review of the MIP images confirms the above findings.
IMPRESSION: Cannot exclude mild rectal wall thickening.

## 2009-04-11 ENCOUNTER — Ambulatory Visit: Payer: Self-pay | Admitting: Vascular Surgery

## 2009-04-11 ENCOUNTER — Inpatient Hospital Stay (HOSPITAL_COMMUNITY): Admission: RE | Admit: 2009-04-11 | Discharge: 2009-04-12 | Payer: Self-pay | Admitting: Vascular Surgery

## 2009-04-11 IMAGING — RF DG ABDOMEN 2V
1 series · 2 of 2 positions shown · non-contrast
Comparison: CT abdomen pelvis [DATE]

CLINICAL DATA: Aorto-iliac stent graft placement.

ABDOMEN - 2 VIEW

[Series 1: run · 2 of 2 slices shown]
[im 1/2]
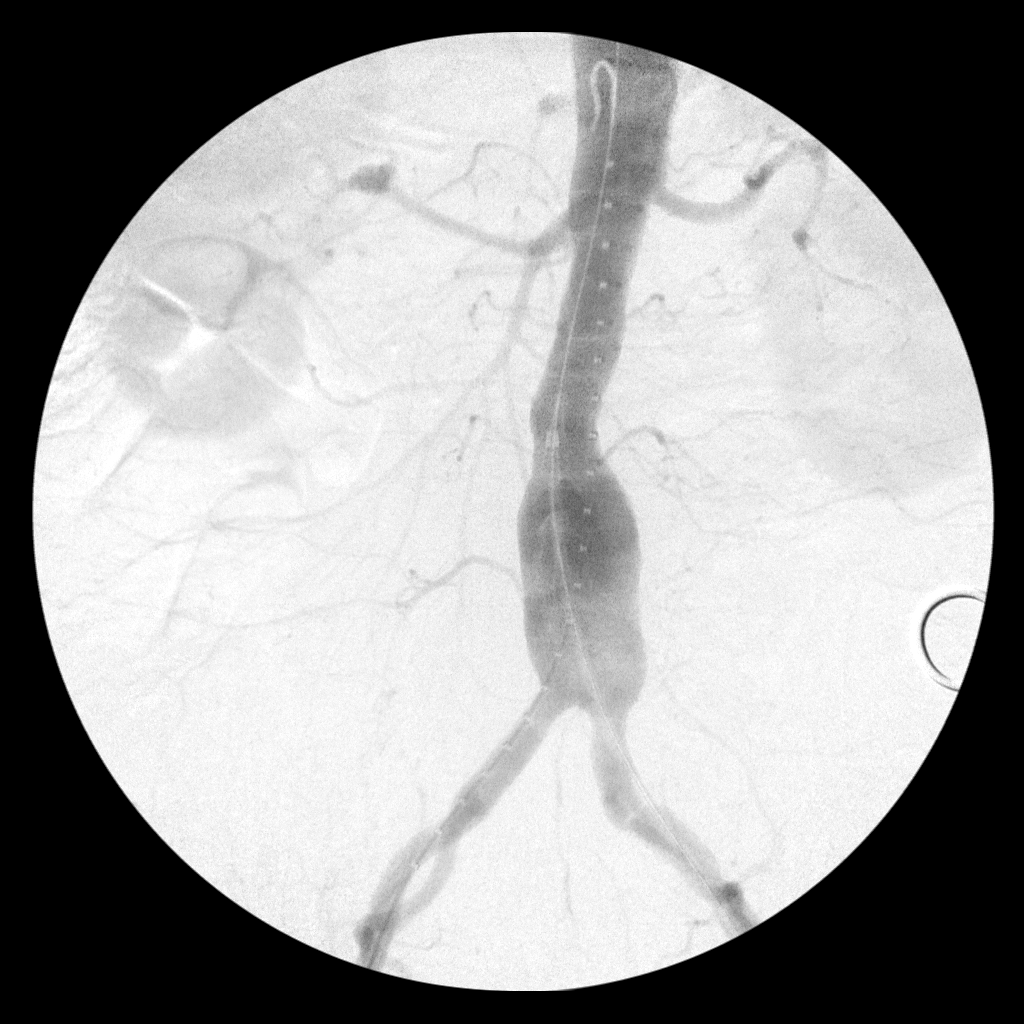
[im 2/2]
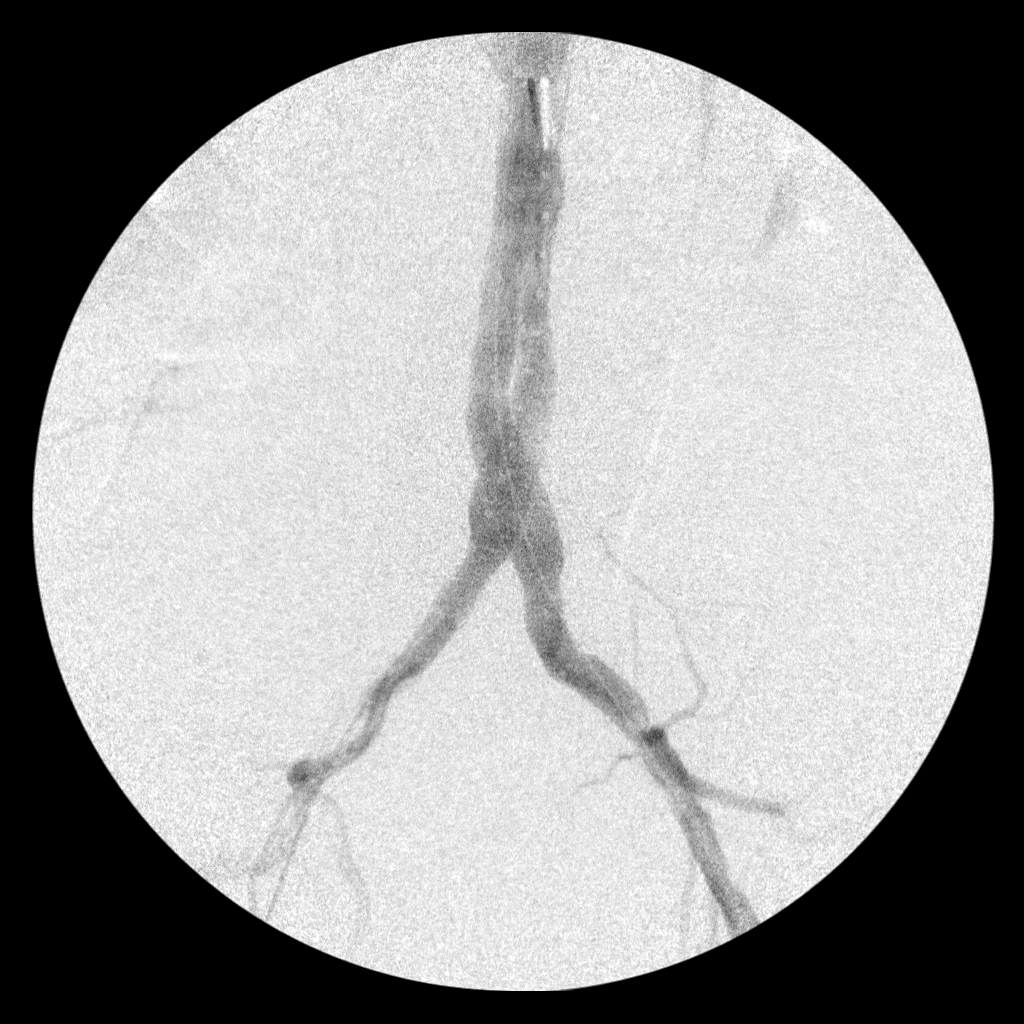

[2 of 2 positions shown; findings below may reference images not displayed]

FINDINGS: Two fluoroscopic spot images demonstrate bilateral iliac
guide wires and contrast in the aortoiliac tree.  Distal aortic
aneurysm is noted.
IMPRESSION: Distal aortic aneurysm.

## 2009-04-11 IMAGING — CR DG CHEST 1V PORT
1 series · 1 of 1 positions shown · non-contrast
Comparison: [DATE]

CLINICAL DATA: Abdominal aortic aneurysm.

PORTABLE CHEST - 1 VIEW

[view not recorded]
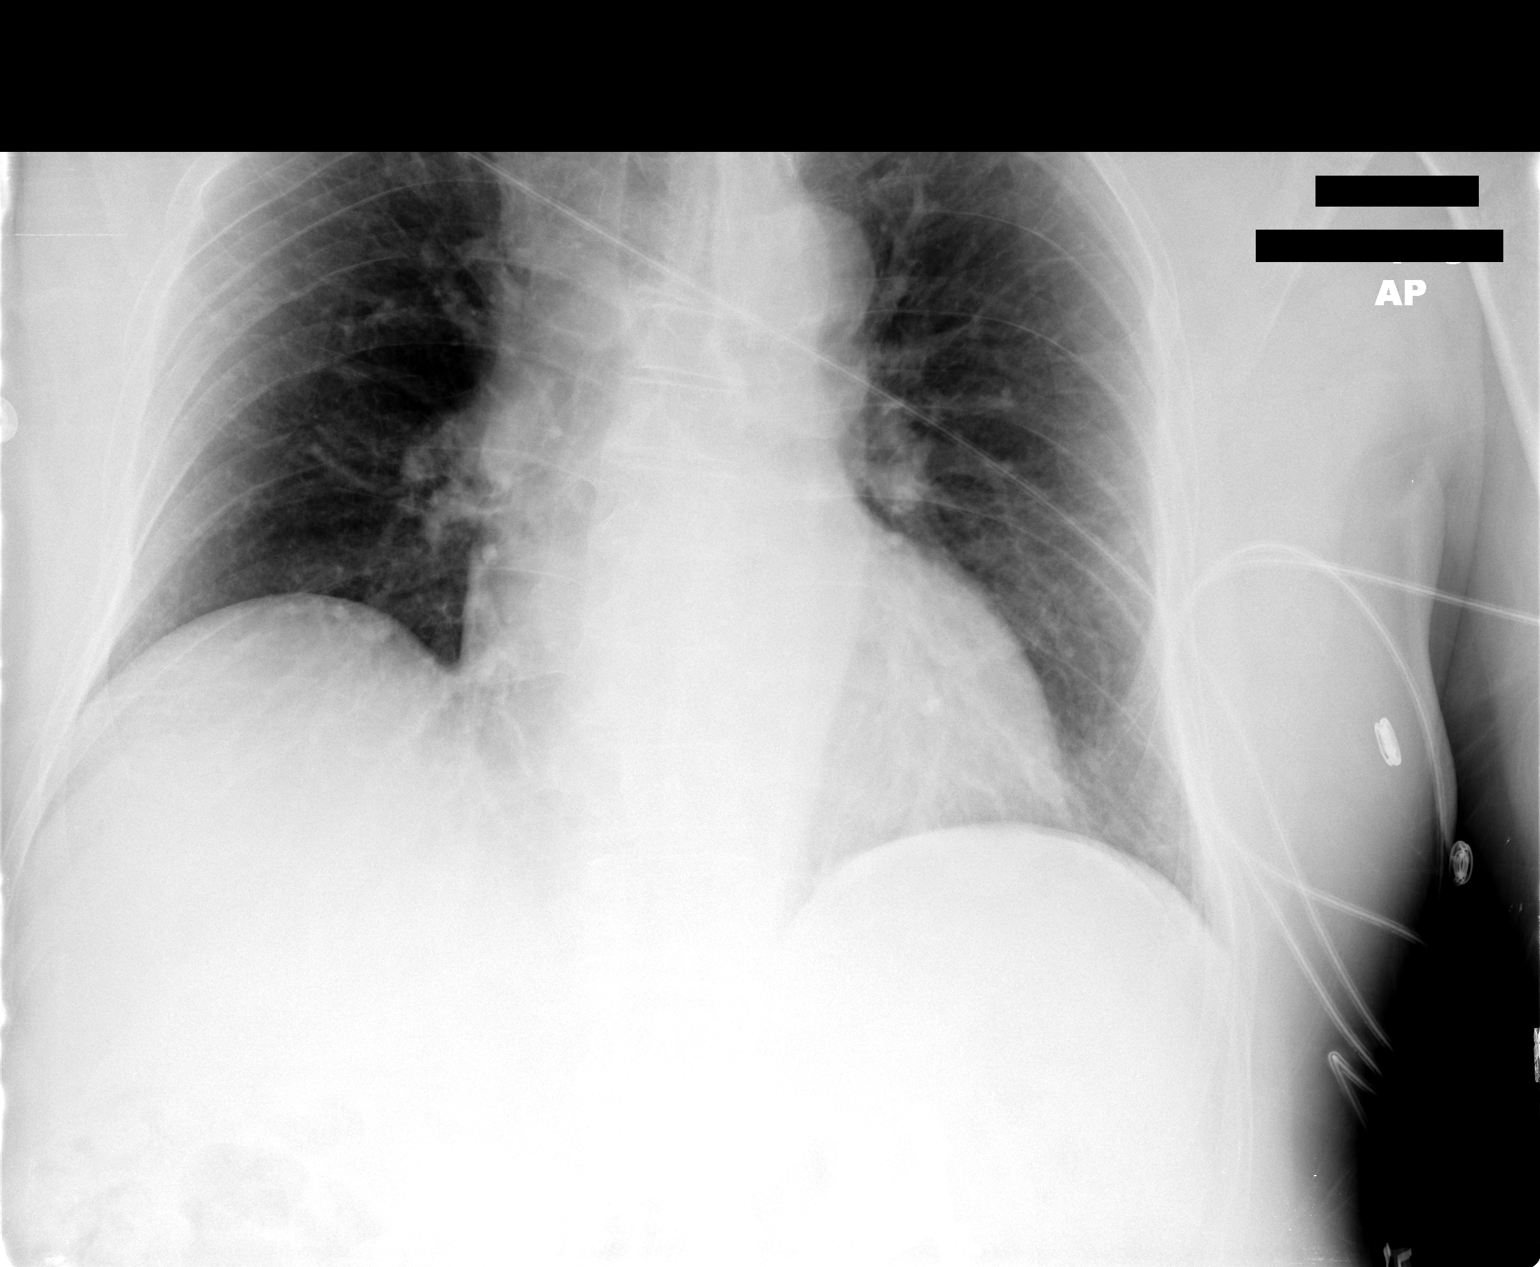

[1 of 1 positions shown; findings below may reference images not displayed]

FINDINGS: There is a right IJ Cordis in place.  The cardiac
silhouette, mediastinal and hilar contours are stable.  Lungs
remain clear.
IMPRESSION: No acute cardiopulmonary findings.
Right IJ Cordis tip near the IJ subclavian junction.

## 2009-04-11 IMAGING — CR DG CHEST 2V
2 series · 2 of 2 positions shown · non-contrast
Comparison: [DATE]

CLINICAL DATA: Abdominal aortic aneurysm preop respiratory exam.

CHEST - 2 VIEW

[view not recorded (1 of 2)]
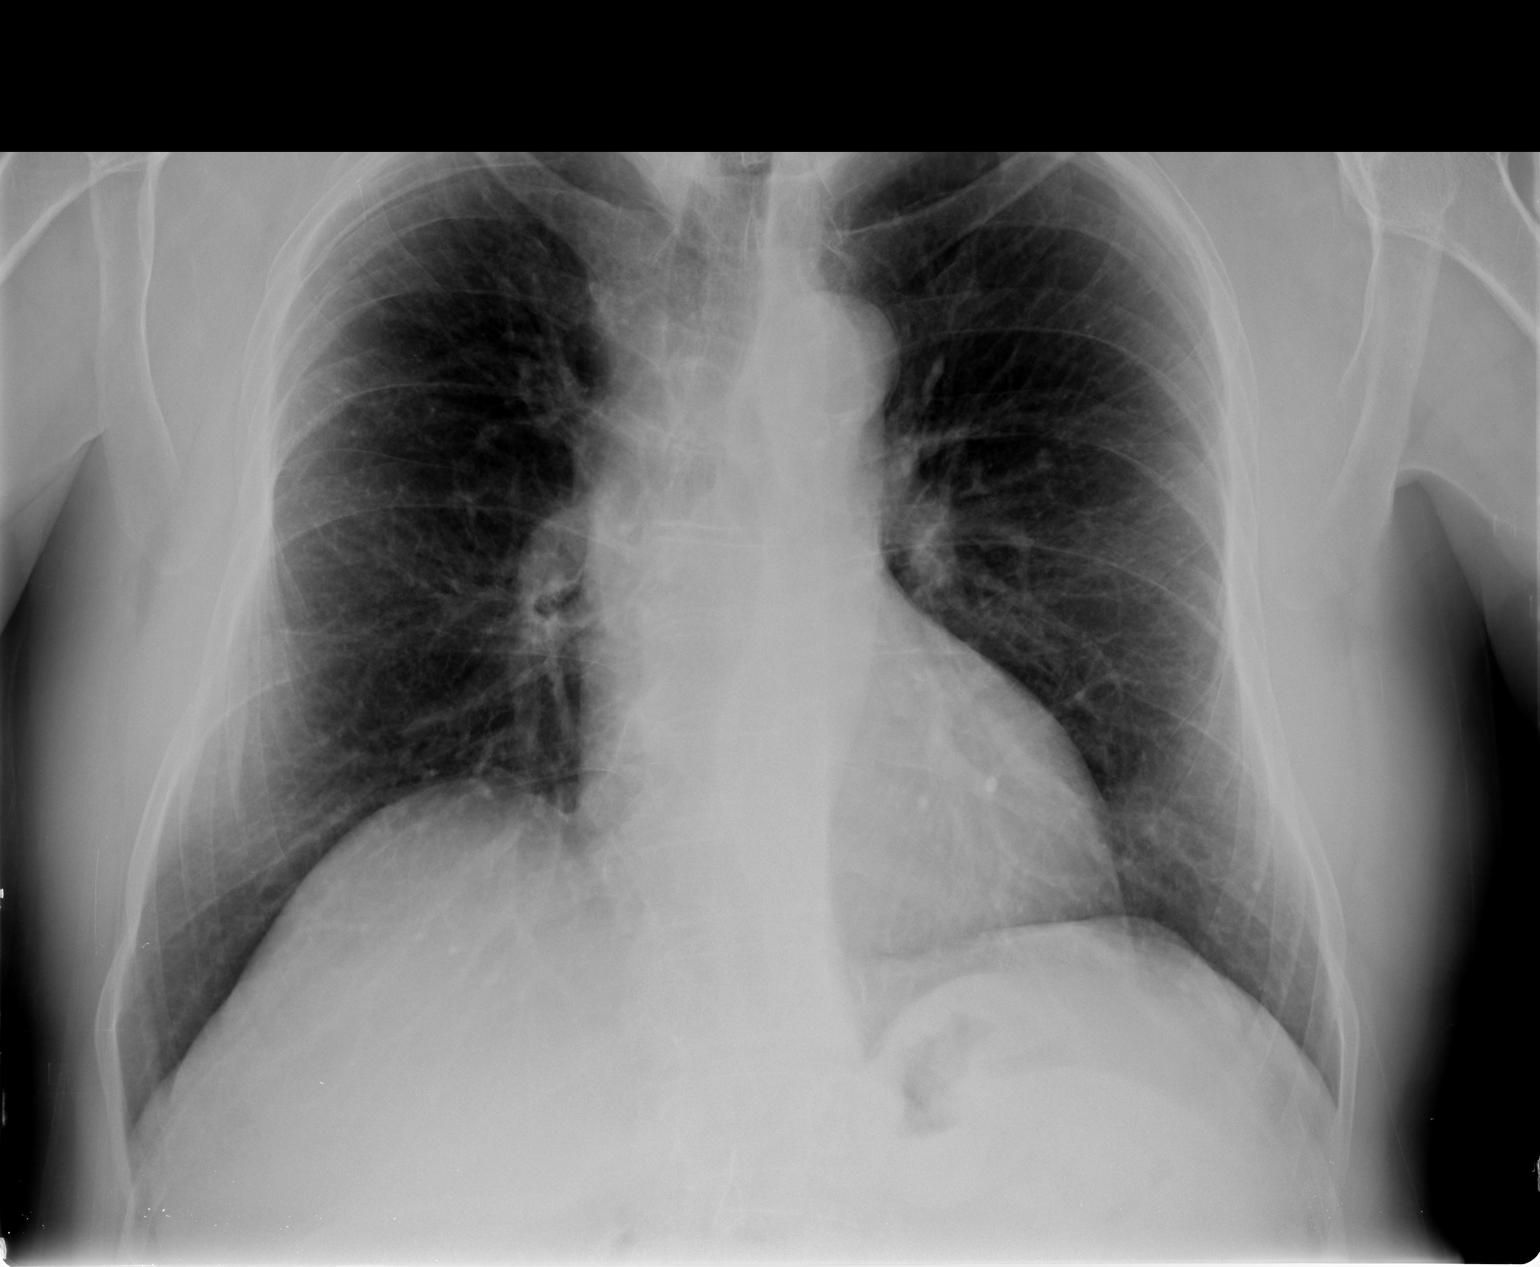

[view not recorded (2 of 2)]
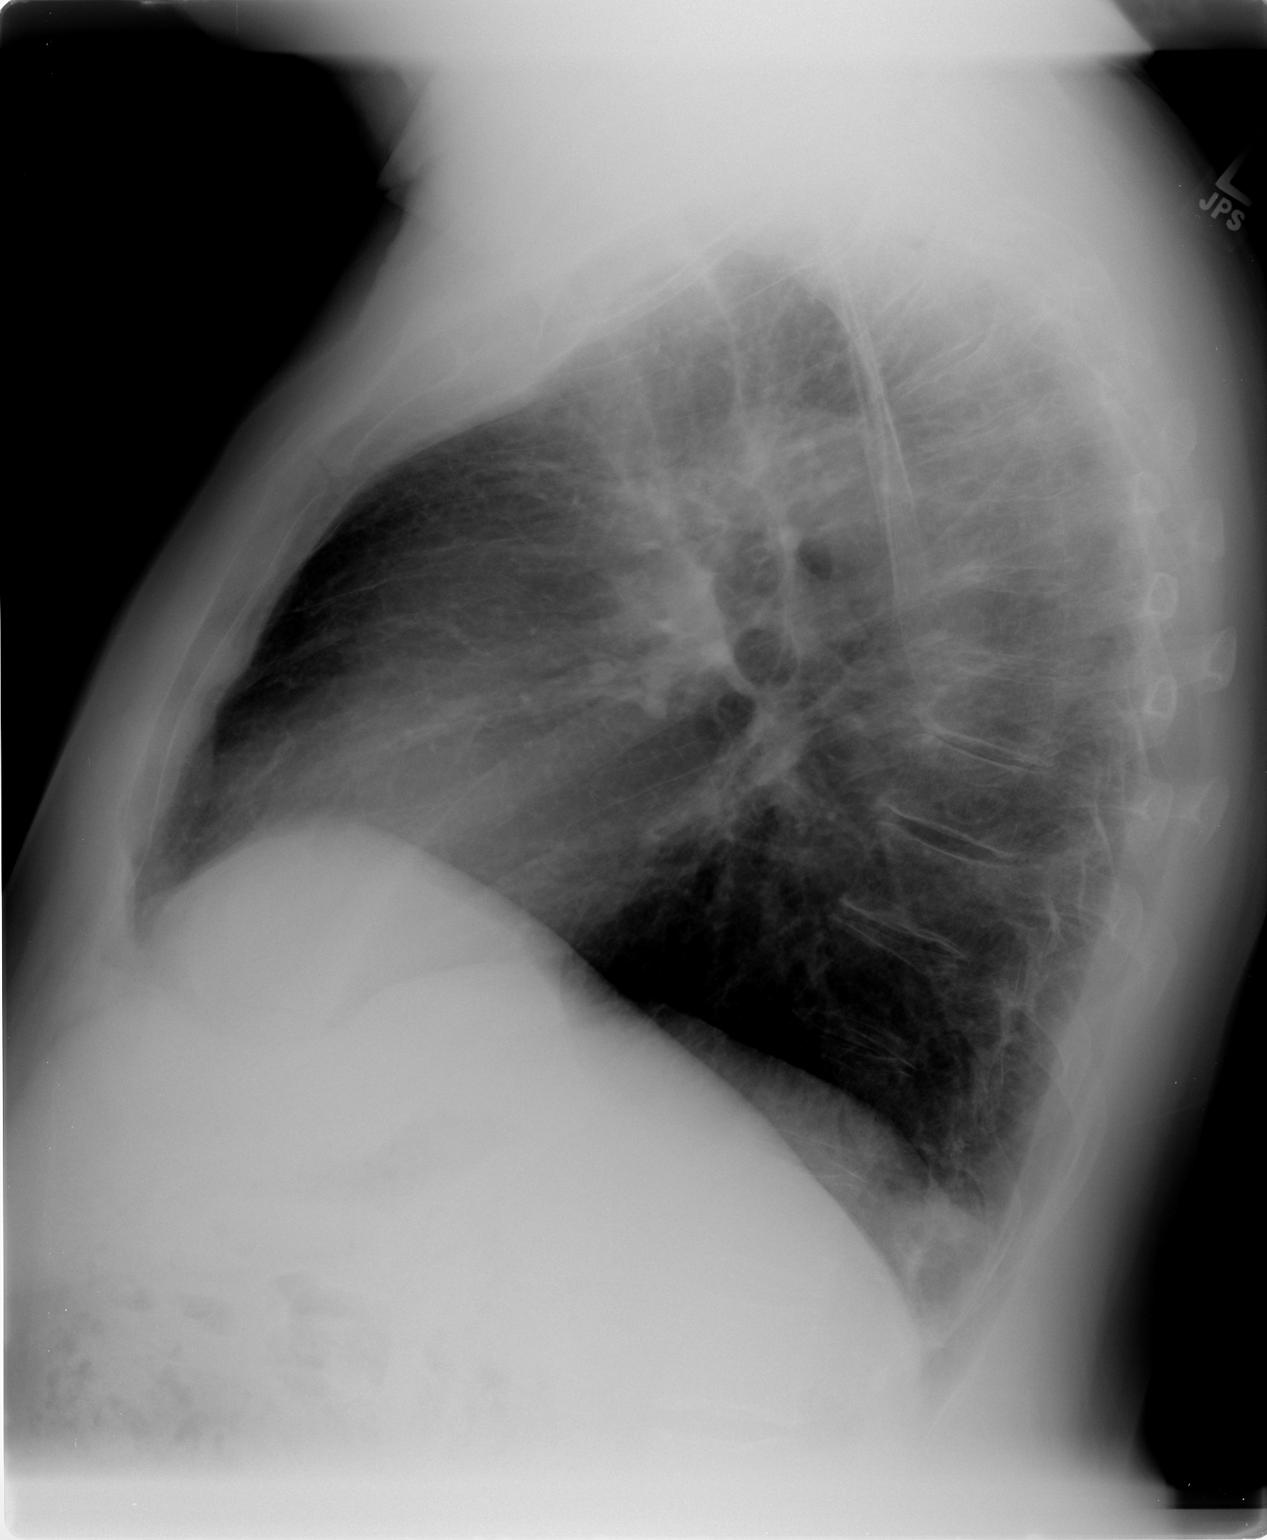

[2 of 2 positions shown; findings below may reference images not displayed]

FINDINGS: The cardiac silhouette, mediastinal and hilar contours
are within normal limits and stable.  The lungs are clear.  Mild
stable eventration of the right hemidiaphragm.  The bony thorax is
intact.
IMPRESSION: No acute cardiopulmonary findings.

## 2009-04-12 IMAGING — CR DG CHEST 1V PORT
1 series · 1 of 1 positions shown · non-contrast
Comparison: [DATE]

CLINICAL DATA: Abdominal aortic aneurysm.

PORTABLE CHEST - 1 VIEW

[view not recorded]
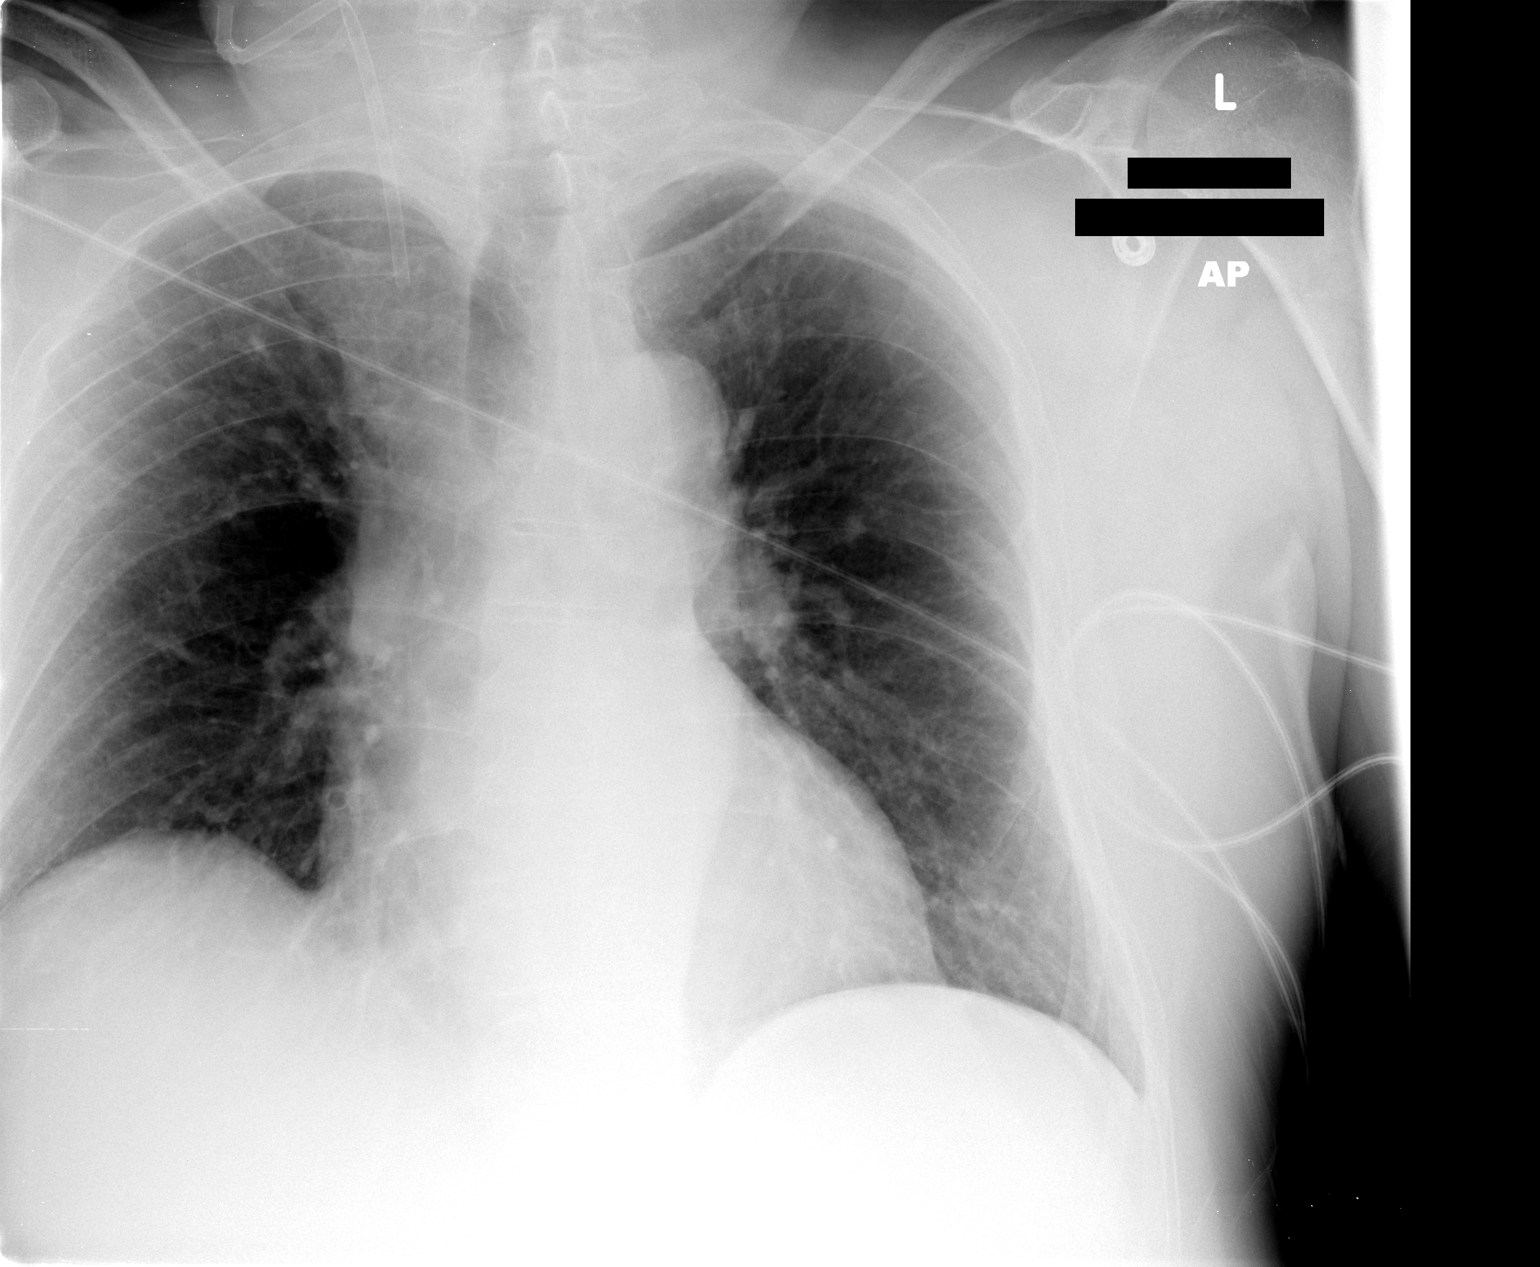

[1 of 1 positions shown; findings below may reference images not displayed]

FINDINGS: There is a right IJ Cordis in place.  The cardiac
silhouette, mediastinal and hilar contours are stable.  Lungs
remain clear.
IMPRESSION: No acute cardiopulmonary findings.
Right IJ Cordis tip near the IJ subclavian junction.

## 2009-05-15 ENCOUNTER — Encounter: Admission: RE | Admit: 2009-05-15 | Discharge: 2009-05-15 | Payer: Self-pay | Admitting: Vascular Surgery

## 2009-05-15 ENCOUNTER — Ambulatory Visit: Payer: Self-pay | Admitting: Vascular Surgery

## 2009-05-15 IMAGING — CT CT ANGIO PELVIS
2 of 10 series · 12 of 46 positions shown, 18 images · IV contrast ([ID] OMNI 300)
Comparison: CT abdomen pelvis of [DATE]

CTA ABDOMEN

CLINICAL DATA: AAA stent repair, follow-up

CT ANGIOGRAPHY ABDOMEN AND PELVIS
TECHNIQUE: Multidetector CT imaging of the abdomen and pelvis was
performed using the standard protocol during bolus administration
of intravenous contrast.  Multiplanar reconstructed images
including MIPs were obtained and reviewed to evaluate the vascular
anatomy.
Contrast:  100 ml [OA]

[Series 5: angio · axial · 0.78mm/px · z∈[-406,-58]mm · 10 of 213 slices shown, 15 images]
[im 17/213  soft-tissue]
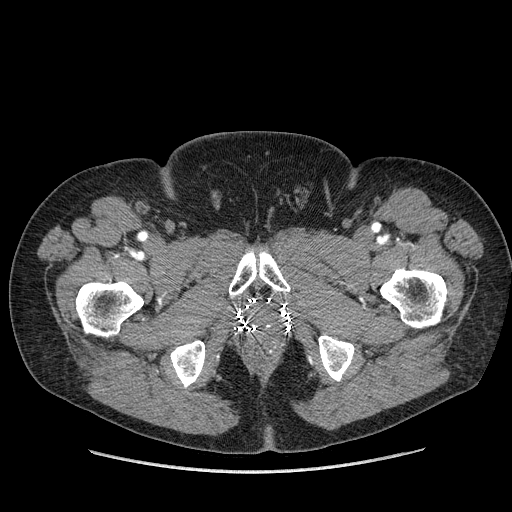
[im 17/213  bone]
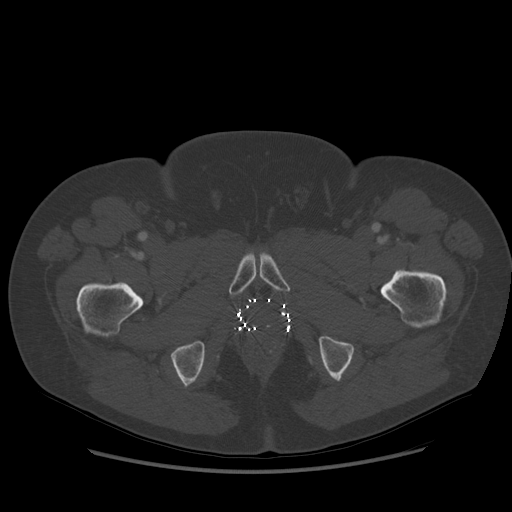
[im 49/213  soft-tissue]
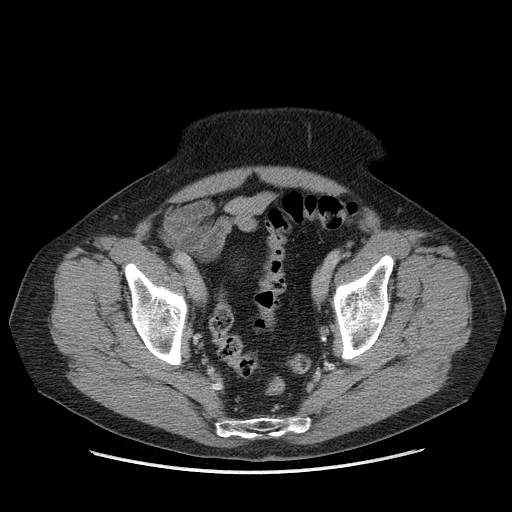
[im 66/213  soft-tissue]
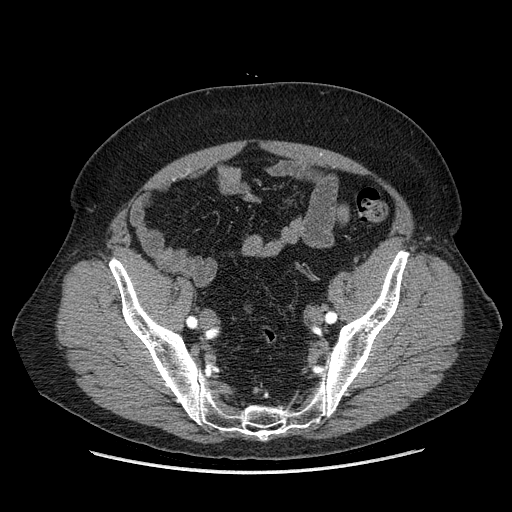
[im 82/213  soft-tissue]
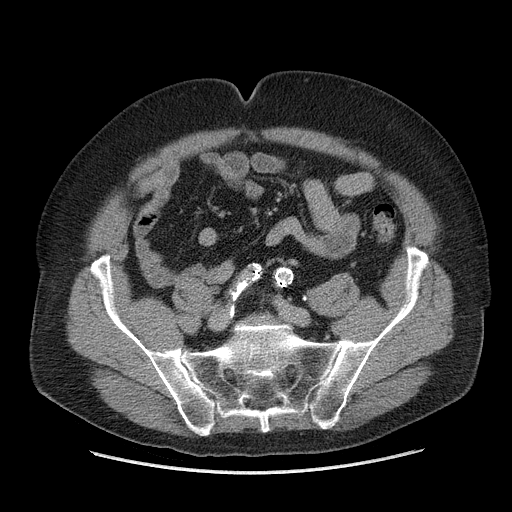
[im 115/213  soft-tissue]
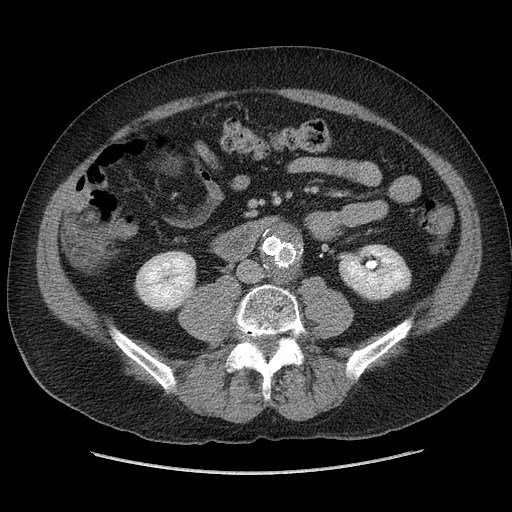
[im 131/213  soft-tissue]
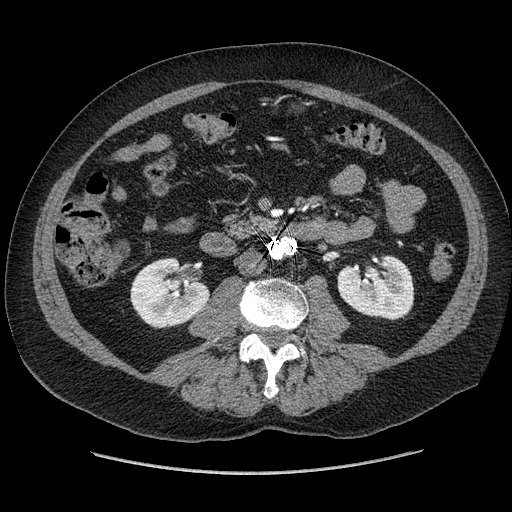
[im 147/213  soft-tissue]
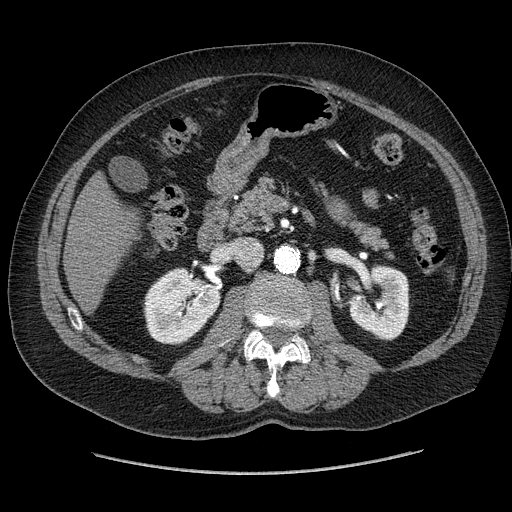
[im 147/213  lung]
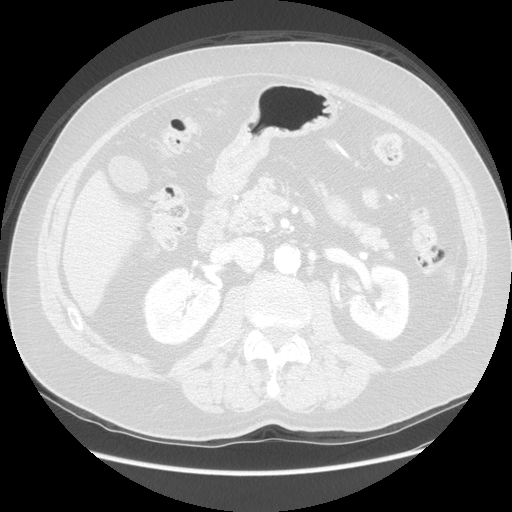
[im 164/213  lung]
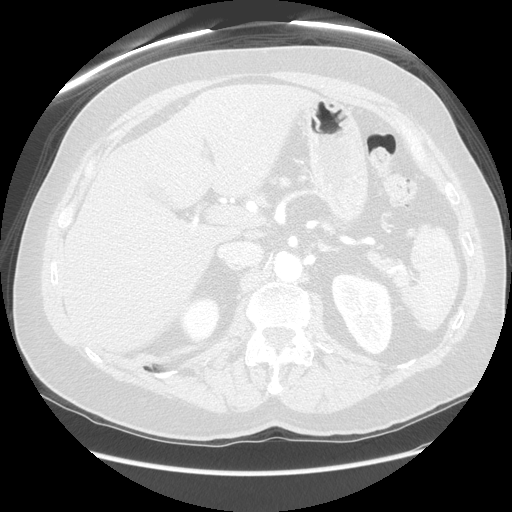
[im 180/213  soft-tissue]
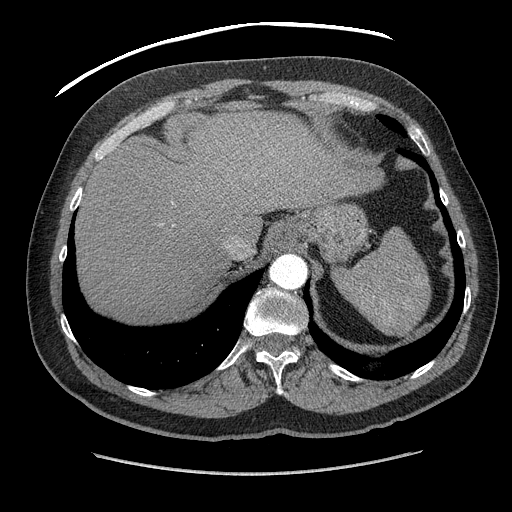
[im 180/213  lung]
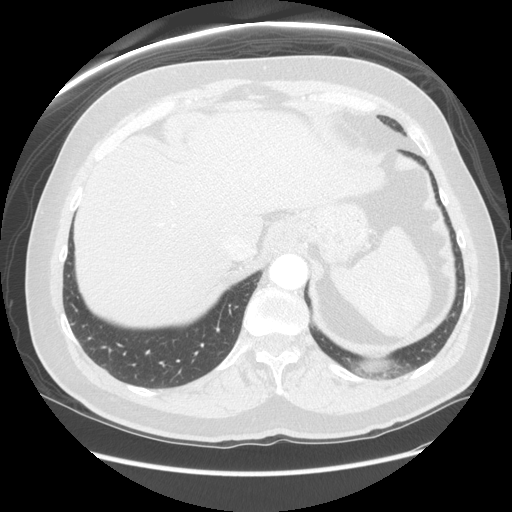
[im 196/213  soft-tissue]
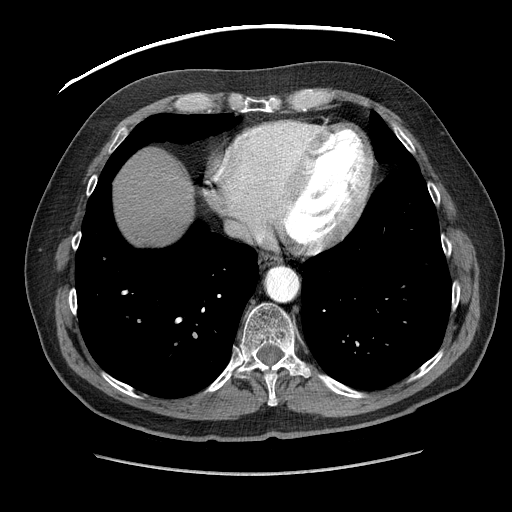
[im 196/213  lung]
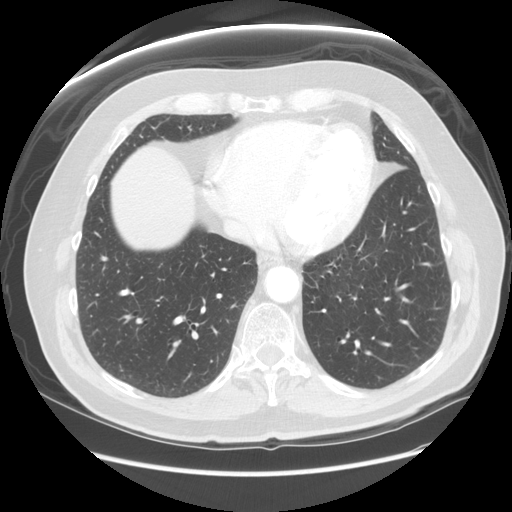
[im 196/213  bone]
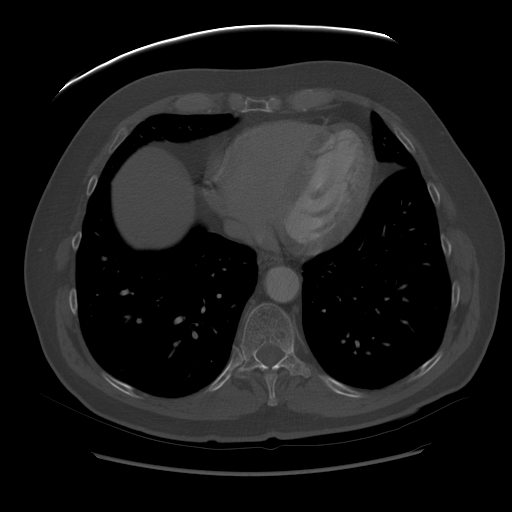

[Series 400: cor venous · coronal · portal-venous · 0.78mm/px · 2 of 128 slices shown, 3 images]
[im 43/128  soft-tissue]
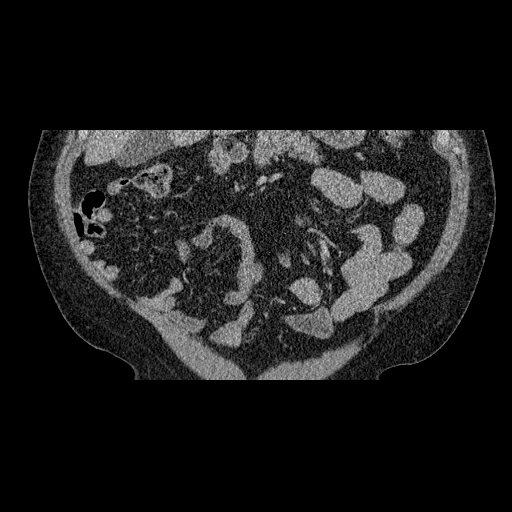
[im 43/128  bone]
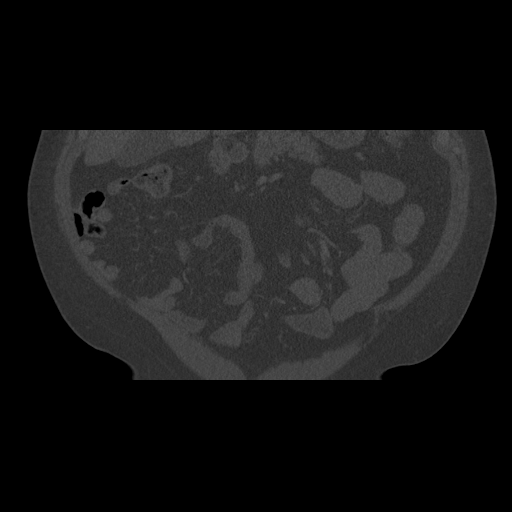
[im 85/128  soft-tissue]
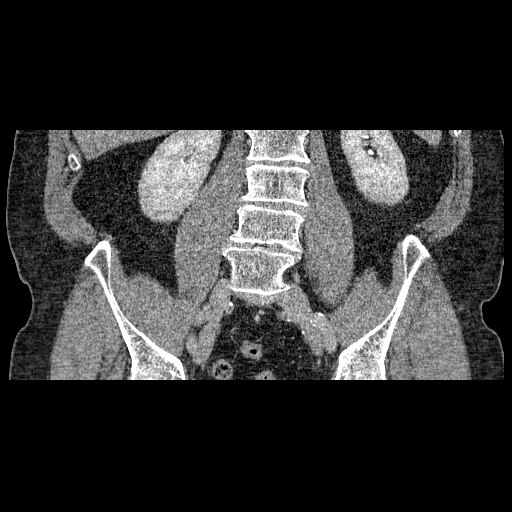

[12 of 46 positions shown; findings below may reference images not displayed]

FINDINGS: A AAA stent is now present.  The proximal portion of the
stent extends to a point just below the origin of the more caudal
right renal artery.  The native aneurysm is stable with maximum
diameter of 49 x 34 mm.  The lumen of the stent opacifies well.  On
delayed images there is no evidence of endovascular leak.

The lung bases are clear.  The liver enhances with no focal
abnormality and no ductal dilatation is seen.  No calcified
gallstones are seen.  The pancreas is normal in size and the
pancreatic duct is not dilated.  The adrenal glands are stable with
a left adrenal adenoma again noted.  The spleen is stable in size.
The kidneys enhance with no mass or hydronephrosis noted.  No
adenopathy is seen.

 Review of the MIP images confirms the above findings.
IMPRESSION: 1.  AAA stent present in good position with no evidence of
endovascular leak.
 2.  Stable size of native abdominal aortic aneurysm.

CTA PELVIS
FINDINGS: The iliac limbs of the AAA stent opacify.  No evidence of
endovascular leak is seen.  The internal and external iliac
arteries opacify with no significant abnormality noted.

Multiple prostate implant seeds are present.  The urinary bladder
is unremarkable.  The terminal ileum and the appendix are well seen
and appear normal.  No fluid is seen within the pelvis.  No acute
bony abnormality is seen.

 Review of the MIP images confirms the above findings.
IMPRESSION: 1.  Iliac limbs of AAA stent opacify well with no endovascular
leak.  And
2.  Multiple prostate implant seeds.

## 2009-11-13 ENCOUNTER — Encounter: Admission: RE | Admit: 2009-11-13 | Discharge: 2009-11-13 | Payer: Self-pay | Admitting: Vascular Surgery

## 2009-11-13 ENCOUNTER — Ambulatory Visit: Payer: Self-pay | Admitting: Vascular Surgery

## 2009-11-13 IMAGING — CT CT ANGIO ABDOMEN
3 of 10 series · 11 of 36 positions shown, 16 images · IV contrast ([ID] OMNI 300)
Comparison: [DATE]

CTA ABDOMEN

CLINICAL DATA: AAA post surgery [DATE].  Prostate cancer.

CT ANGIOGRAPHY ABDOMEN AND PELVIS
TECHNIQUE: Multidetector CT imaging of the abdomen and pelvis was
performed using the standard protocol during bolus administration
of intravenous contrast.  Multiplanar reconstructed images
including MIPs were obtained and reviewed to evaluate the vascular
anatomy.
Contrast:  100 ml [TC].

[Series 5: angio · axial · 0.78mm/px · z∈[-296,-45]mm · 5 of 220 slices shown, 10 images]
[im 37/220  soft-tissue]
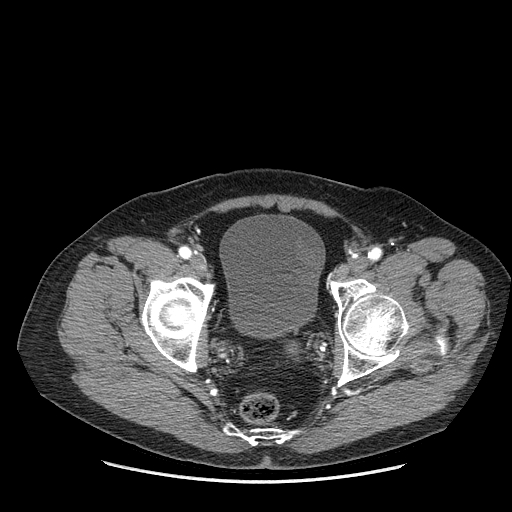
[im 37/220  bone]
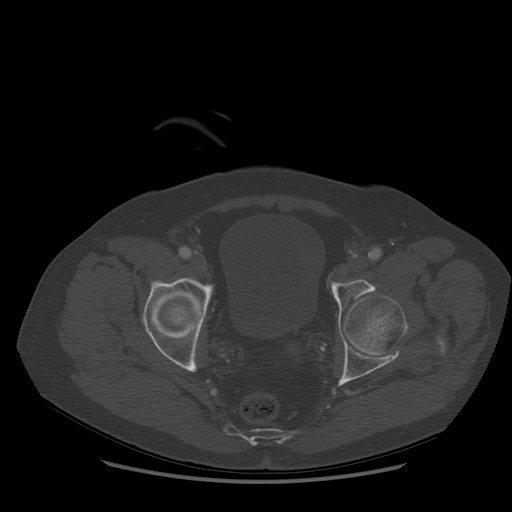
[im 74/220  soft-tissue]
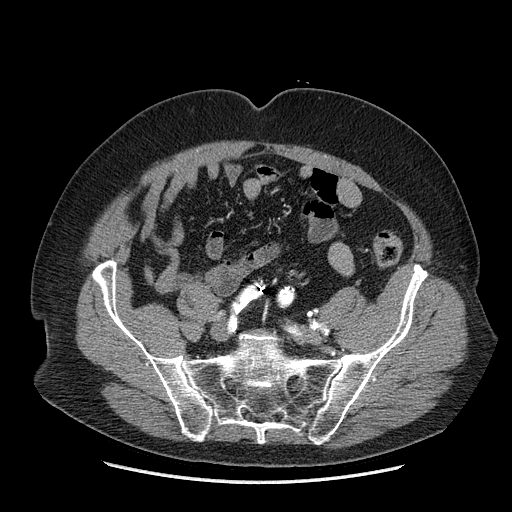
[im 74/220  lung]
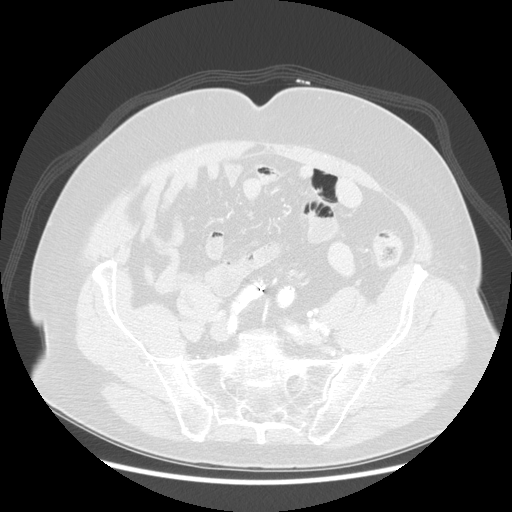
[im 110/220  soft-tissue]
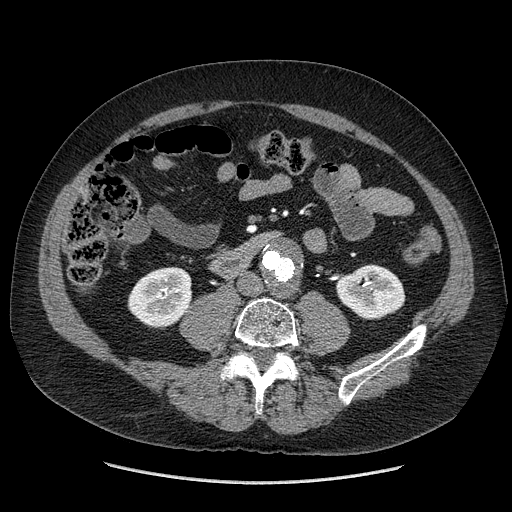
[im 110/220  lung]
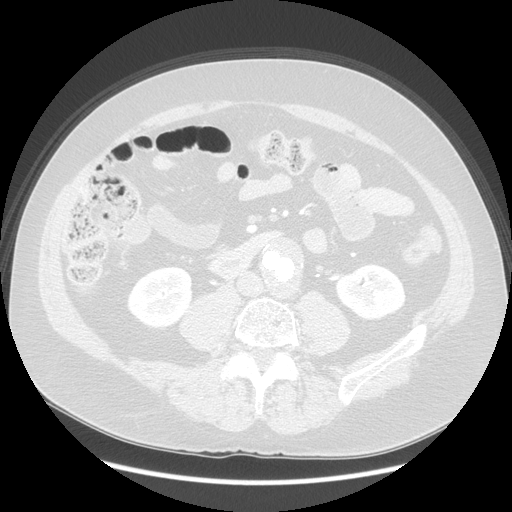
[im 147/220  soft-tissue]
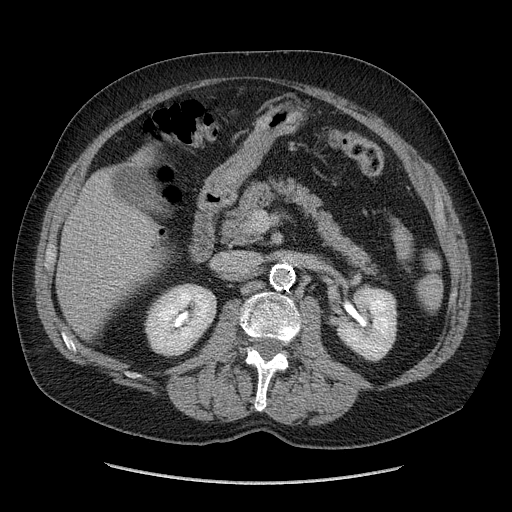
[im 147/220  lung]
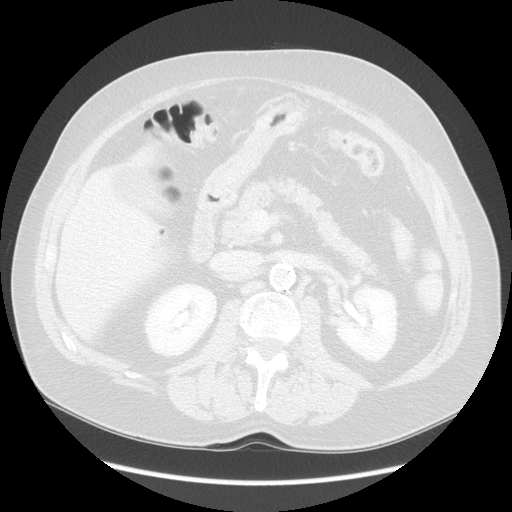
[im 183/220  soft-tissue]
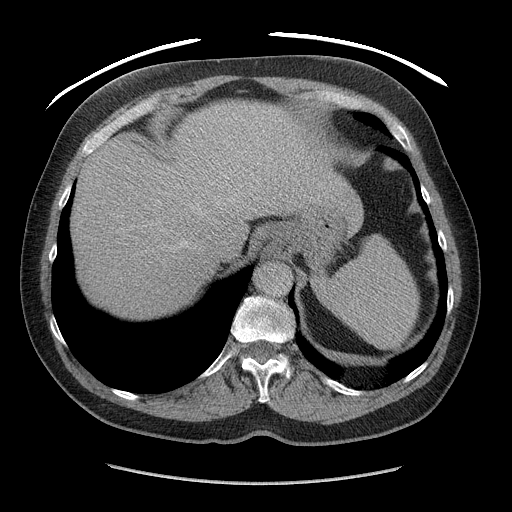
[im 183/220  lung]
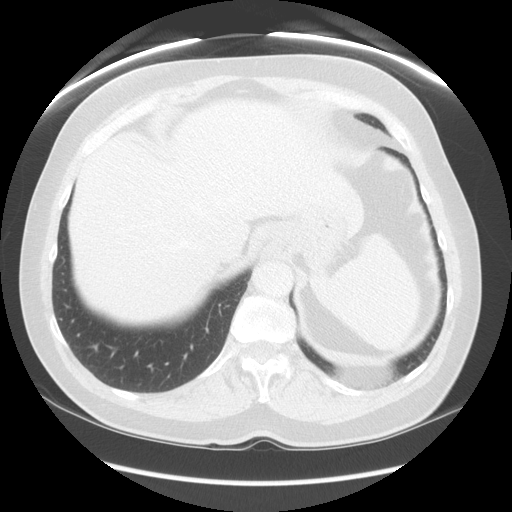

[Series 602: sagittal body · sagittal · 0.84mm/px · 3 of 159 slices shown (1 of 2)]
[im 40/159  soft-tissue]
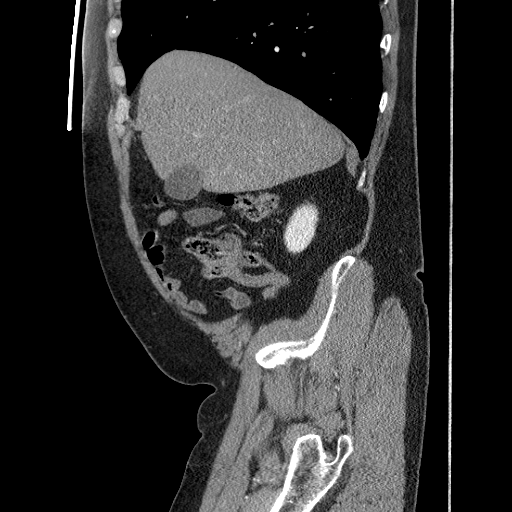
[im 80/159  soft-tissue]
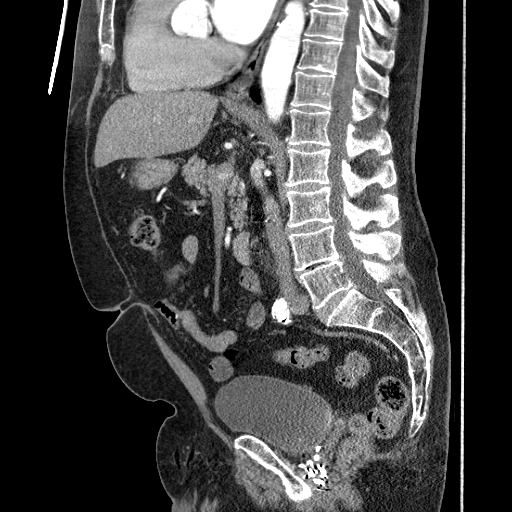
[im 119/159  soft-tissue]
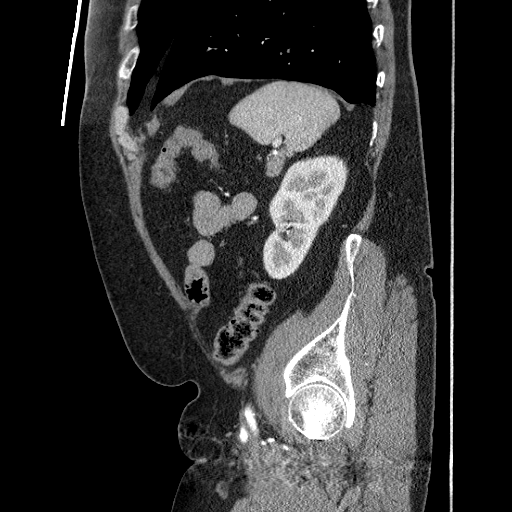

[Series 605: sagittal body · sagittal · 0.78mm/px · 3 of 158 slices shown (2 of 2)]
[im 40/158  soft-tissue]
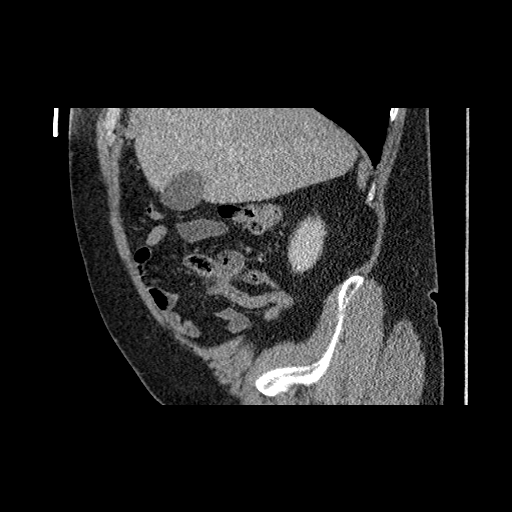
[im 79/158  soft-tissue]
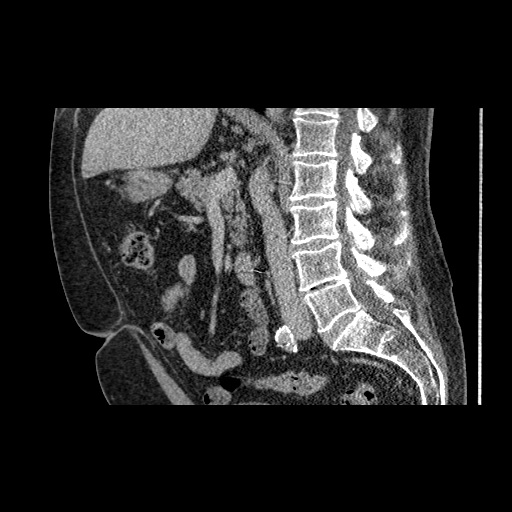
[im 118/158  soft-tissue]
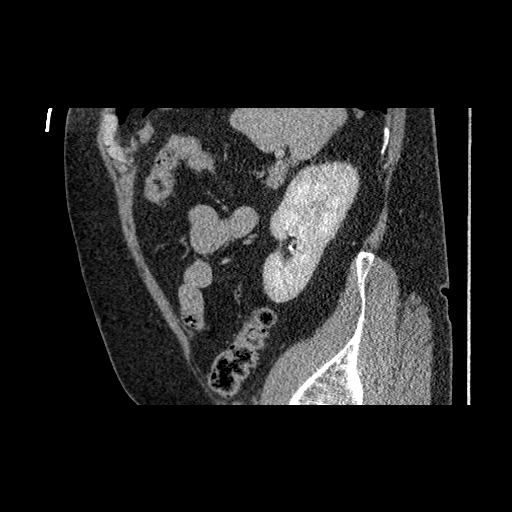

[11 of 36 positions shown; findings below may reference images not displayed]

FINDINGS: Lung bases show no acute findings.  Heart size normal.
No pericardial or pleural effusion.

Small Bochdalek hernias bilaterally.  Liver, gallbladder and right
adrenal gland are unremarkable.  A 1.8 x 2.2 cm nodule in the left
adrenal gland measures 10 HU on precontrast imaging.  Mildly
increased attenuation in the renal collecting systems may be due to
concentrated urine.  Sub centimeter low attenuation lesions in both
kidneys are too small to characterize, but statistically, likely
represent cysts.  Spleen, pancreas, stomach and small bowel are
unremarkable.

There has been endovascular stent graft repair of an infrarenal
aortic aneurysm.  Native aneurysm sac measures 5.0 x 3.5 cm,
stable.  Iliac limbs opacify symmetrically.  There is vague, mildly
increased attenuation adjacent to the right iliac limb (2 o'clock
position), within the distal abdominal aorta (series 5, image 199).

No pathologically enlarged lymph nodes.  No free fluid.

 Review of the MIP images confirms the above findings.
IMPRESSION: 1.  Endovascular stent graft repair of an infrarenal aortic
aneurysm, with stable size of the native aneurysm sac.  Vague
increased attenuation in the distal abdominal aorta may be
artifactual.  Continued attention on follow-up exams is warranted.
2.  Left adrenal adenoma.

CTA PELVIS
FINDINGS: Brachytherapy seeds are seen in the prostate.  Colon and
appendix unremarkable.  External iliac chain lymph nodes measure up
to 11 mm in short axis on the right, stable.  No worrisome lytic or
sclerotic lesions.  Degenerative changes are seen in the spine.

 Review of the MIP images confirms the above findings.
IMPRESSION: No acute findings in the pelvis.

## 2010-05-08 ENCOUNTER — Encounter
Admission: RE | Admit: 2010-05-08 | Discharge: 2010-05-08 | Payer: Self-pay | Source: Home / Self Care | Attending: Vascular Surgery | Admitting: Vascular Surgery

## 2010-05-08 ENCOUNTER — Ambulatory Visit: Payer: Self-pay | Admitting: Vascular Surgery

## 2010-05-08 IMAGING — CT CT ANGIO PELVIS
2 of 9 series · 12 of 46 positions shown, 18 images · IV contrast ([ID] OMNI 300)
Comparison: [DATE]

CTA ABDOMEN

CLINICAL DATA: AAA, postop stent placement.  History of prostate
cancer with treatment with radiation seeds in [JA].

CT ANGIOGRAPHY ABDOMEN AND PELVIS
TECHNIQUE: Multidetector CT imaging of the abdomen and pelvis was
performed using the standard protocol during bolus administration
of intravenous contrast.  Multiplanar reconstructed images
including MIPs were obtained and reviewed to evaluate the vascular
anatomy.
Contrast:  100 ml Omnipaque 300 IV.

[Series 5: angio · axial · 0.74mm/px · z∈[-366,-48]mm · 10 of 219 slices shown, 16 images]
[im 20/219  soft-tissue]
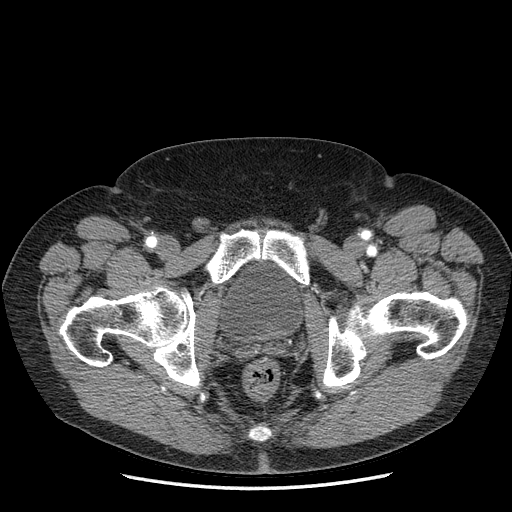
[im 20/219  bone]
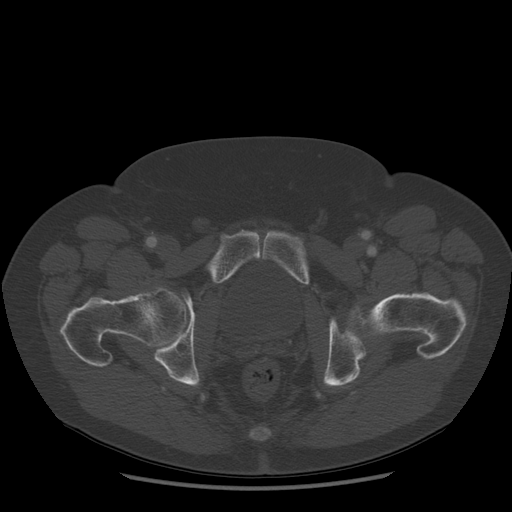
[im 40/219  soft-tissue]
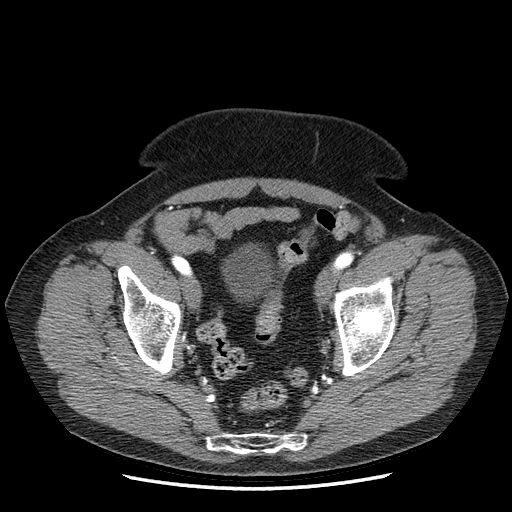
[im 60/219  soft-tissue]
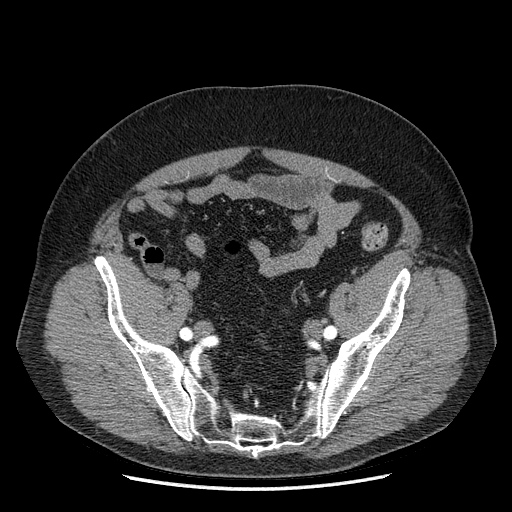
[im 80/219  soft-tissue]
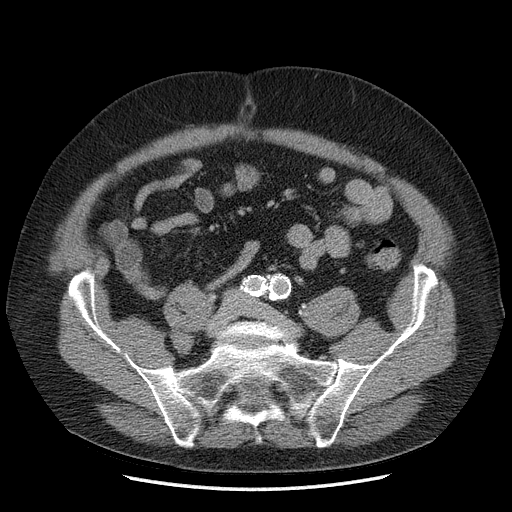
[im 100/219  soft-tissue]
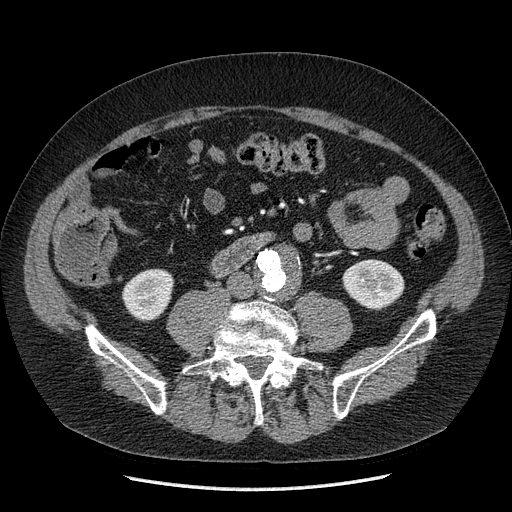
[im 119/219  soft-tissue]
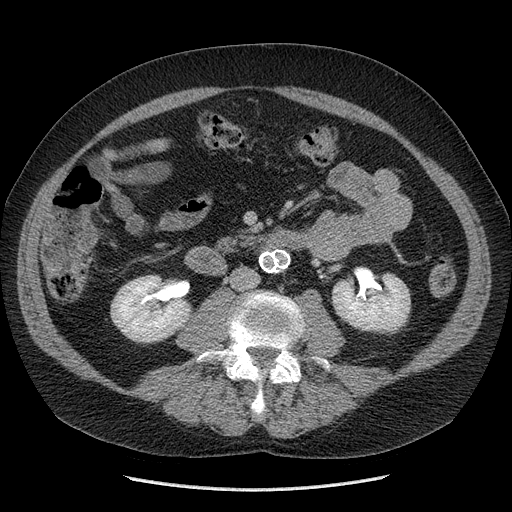
[im 139/219  soft-tissue]
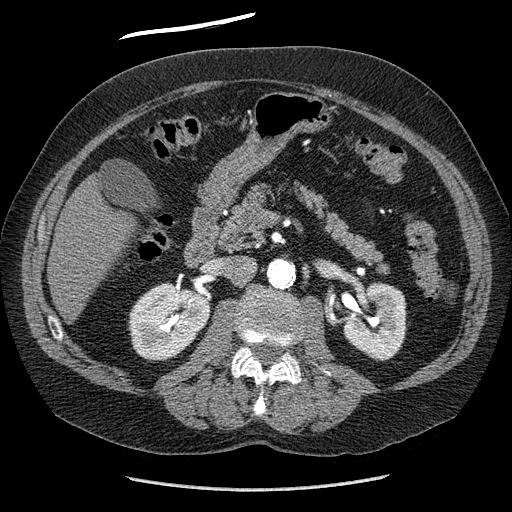
[im 139/219  lung]
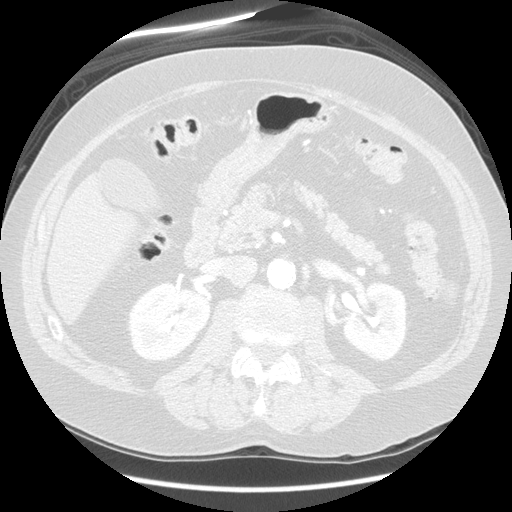
[im 159/219  soft-tissue]
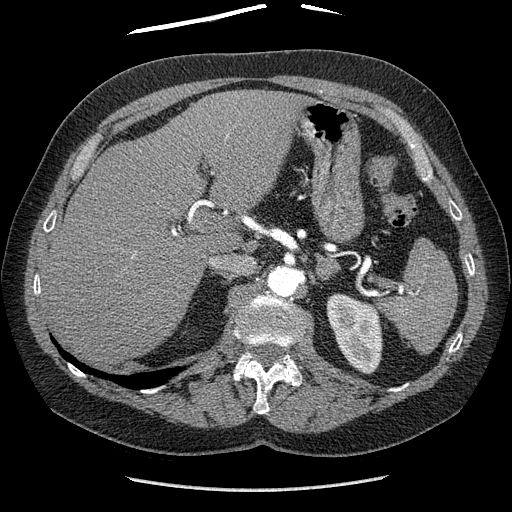
[im 159/219  lung]
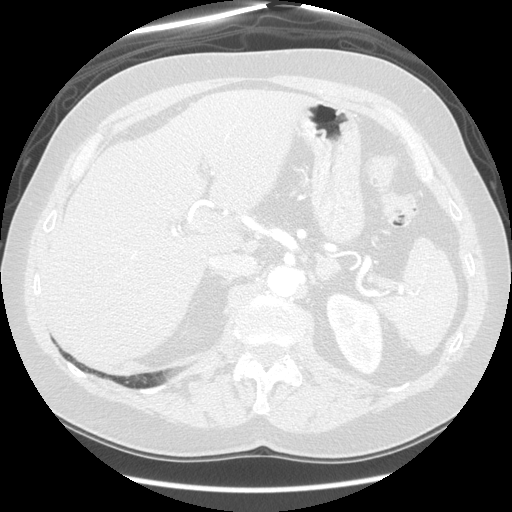
[im 179/219  soft-tissue]
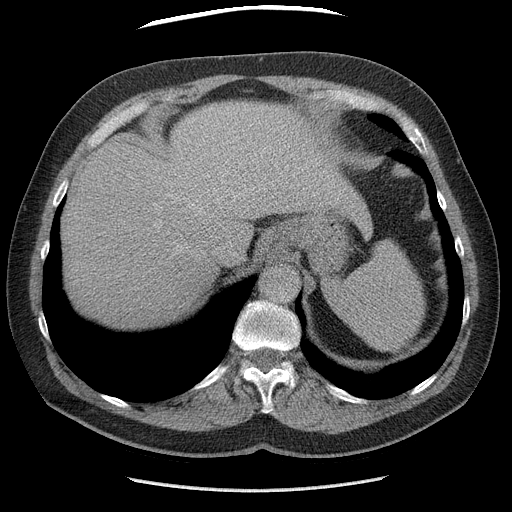
[im 179/219  lung]
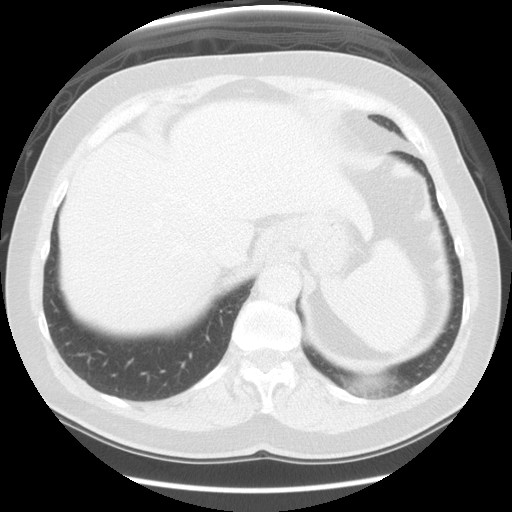
[im 179/219  bone]
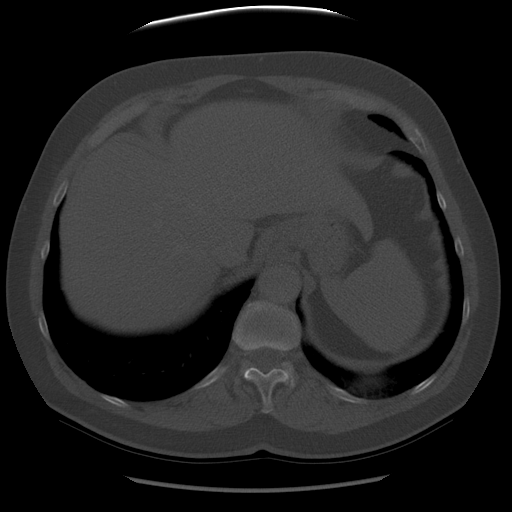
[im 199/219  soft-tissue]
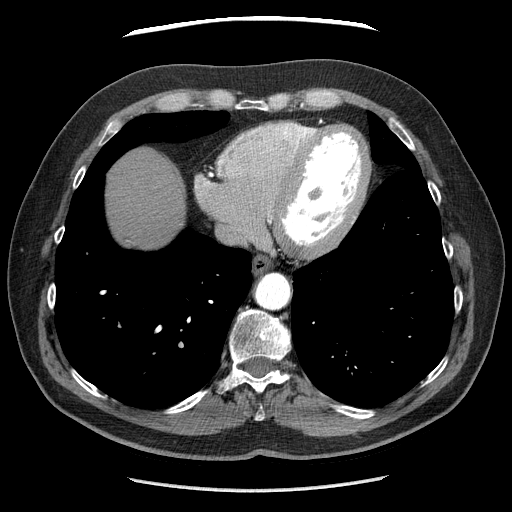
[im 199/219  lung]
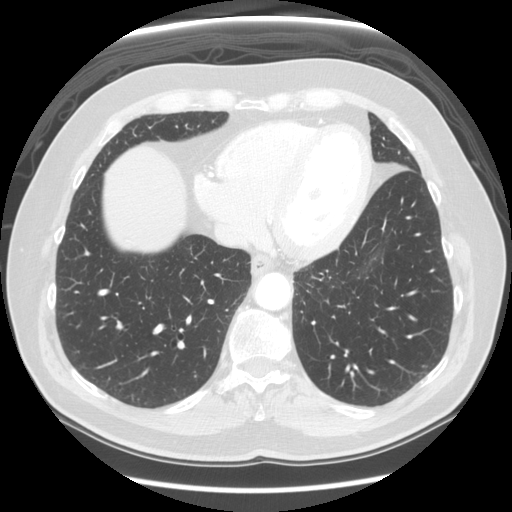

[Series 602: sagittal body · sagittal · 0.86mm/px · 2 of 153 slices shown]
[im 22/153  soft-tissue]
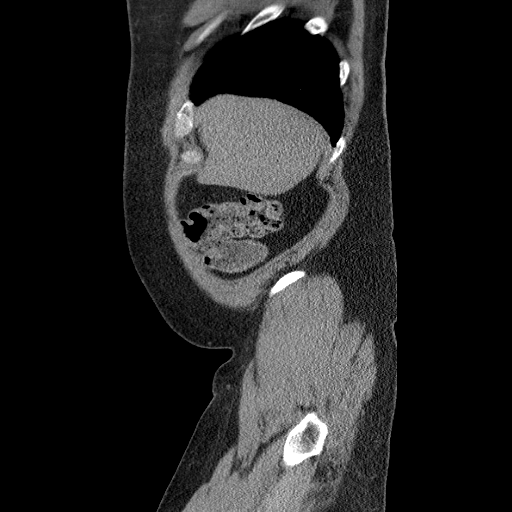
[im 44/153  soft-tissue]
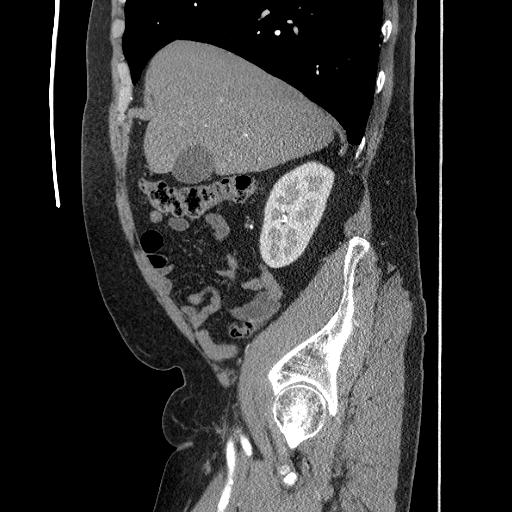

[12 of 46 positions shown; findings below may reference images not displayed]

FINDINGS: The patient is status post stent graft repair of the
abdominal aortic aneurysm.  Aneurysm sac size is currently 4.8 x
3.3 cm compared to 5.0 x 3.5 cm previously.  No evidence of
endoleak.  The proximal inferior mesenteric artery is occluded with
reconstitution of the peripheral branches from SMA collaterals.
Kidneys enhance symmetrically and normally.  No hydronephrosis.

Liver, spleen, gallbladder, pancreas and right adrenal are
unremarkable.  Stable left adrenal nodule compatible with adenoma.
Bowel grossly unremarkable.  No free fluid, free air, or
adenopathy.

 Review of the MIP images confirms the above findings.
IMPRESSION: Slight interval decrease in size of the aneurysm sac post stent
graft repair of the AAA.  Maximum diameter currently 4.8 cm.  No
evidence of endoleak.

Left adrenal adenoma, stable.

CTA PELVIS
FINDINGS: The stent graft extends into the common iliac arteries
which are nonaneurysmal.  No evidence of endoleak.  External iliac
arteries and common femoral arteries are unremarkable.  Internal
iliac arteries remain patent.

Radiation seeds are noted in the prostate.  Urinary bladder, large
and small bowel are grossly unremarkable.  No free fluid, free air
or adenopathy.

No fluid collections within the inguinal regions.  No acute bony
abnormality.  Degenerative changes in the lower lumbar spine.

 Review of the MIP images confirms the above findings.
IMPRESSION: Iliofemoral vessels are unremarkable.  No evidence of distal
endoleak.

## 2010-09-03 LAB — URINALYSIS, ROUTINE W REFLEX MICROSCOPIC
Nitrite: NEGATIVE
Protein, ur: NEGATIVE mg/dL
Specific Gravity, Urine: 1.026 (ref 1.005–1.030)
Urobilinogen, UA: 1 mg/dL (ref 0.0–1.0)

## 2010-09-03 LAB — TYPE AND SCREEN
ABO/RH(D): O POS
Antibody Screen: NEGATIVE

## 2010-09-03 LAB — PROTIME-INR
INR: 1.17 (ref 0.00–1.49)
Prothrombin Time: 14.1 seconds (ref 11.6–15.2)
Prothrombin Time: 14.8 seconds (ref 11.6–15.2)

## 2010-09-03 LAB — COMPREHENSIVE METABOLIC PANEL
ALT: 18 U/L (ref 0–53)
AST: 21 U/L (ref 0–37)
Alkaline Phosphatase: 48 U/L (ref 39–117)
BUN: 13 mg/dL (ref 6–23)
CO2: 25 mEq/L (ref 19–32)
Chloride: 102 mEq/L (ref 96–112)
Chloride: 110 mEq/L (ref 96–112)
Creatinine, Ser: 0.83 mg/dL (ref 0.4–1.5)
GFR calc Af Amer: >60 mL/min (ref >60)
GFR calc non Af Amer: >60 mL/min (ref >60)
GFR calc non Af Amer: >60 mL/min (ref >60)
Glucose, Bld: 121 mg/dL — ABNORMAL HIGH (ref 70–99)
Sodium: 135 mEq/L (ref 135–145)
Sodium: 139 mEq/L (ref 135–145)
Total Bilirubin: 0.7 mg/dL (ref 0.3–1.2)

## 2010-09-03 LAB — BLOOD GAS, ARTERIAL
Acid-base deficit: 1 mmol/L (ref 0.0–2.0)
Drawn by: 206361
FIO2: 0.21 %
pCO2 arterial: 40 mmHg (ref 35.0–45.0)
pH, Arterial: 7.384 (ref 7.350–7.450)

## 2010-09-03 LAB — BASIC METABOLIC PANEL
BUN: 12 mg/dL (ref 6–23)
Chloride: 109 mEq/L (ref 96–112)
GFR calc Af Amer: >60 mL/min (ref >60)
GFR calc non Af Amer: >60 mL/min (ref >60)
Glucose, Bld: 139 mg/dL — ABNORMAL HIGH (ref 70–99)
Sodium: 138 mEq/L (ref 135–145)

## 2010-09-03 LAB — CBC
HCT: 41.7 % (ref 39.0–52.0)
Hemoglobin: 14.1 g/dL (ref 13.0–17.0)
MCHC: 33.8 g/dL (ref 30.0–36.0)
MCHC: 34.2 g/dL (ref 30.0–36.0)
MCV: 91.3 fL (ref 78.0–100.0)
MCV: 91.6 fL (ref 78.0–100.0)
RBC: 3.7 MIL/uL — ABNORMAL LOW (ref 4.22–5.81)
RBC: 3.89 MIL/uL — ABNORMAL LOW (ref 4.22–5.81)
RBC: 4.55 MIL/uL (ref 4.22–5.81)
RDW: 14 % (ref 11.5–15.5)
WBC: 9 10*3/uL (ref 4.0–10.5)

## 2010-09-03 LAB — APTT: aPTT: 27 seconds (ref 24–37)

## 2010-09-03 LAB — ABO/RH: ABO/RH(D): O POS

## 2010-10-02 ENCOUNTER — Other Ambulatory Visit: Payer: Self-pay | Admitting: Gastroenterology

## 2010-10-02 MED ORDER — ESOMEPRAZOLE MAGNESIUM 40 MG PO CPDR: 40.0000 mg | DELAYED_RELEASE_CAPSULE | Freq: Every day | ORAL | Status: DC

## 2010-10-02 NOTE — Telephone Encounter (Signed)
rx sent to [facility] advised pt he must keep ov he has not been seen since 2009

## 2010-10-14 NOTE — Assessment & Plan Note (Signed)
OFFICE VISIT   RAI, SEVERNS  DOB:  [birth year]                                       05/08/2010  [id number]#:[id number]   Mr. Vincent Velez returns for follow-up today.  He previously underwent  aneurysm stent grafting with a Gore excluder stent graft in November  2010.  He has had no difficulty since then.  He states that overall he  fatigues easier than he did several years ago but no other real  complaints.  He denies any claudication symptoms.  He denies any  abdominal or back pain.   REVIEW OF SYSTEMS:  On review of systems, he denies any shortness of  breath or chest pain.   CURRENT MEDICATIONS:  Current medications include Nexium, simvastatin,  glimepiride, Azopt and aspirin.   PHYSICAL EXAMINATION:  Blood pressure is 121/86 in the left arm, heart  rate 80 and regular.  Oxygen saturation is 97% on room air.  HEENT:  Unremarkable.  NECK:  Has 2+ carotid pulses without bruit.  CHEST:  Clear to auscultation.  CARDIAC:  Regular rate rhythm without murmur.  ABDOMEN:  Soft, nontender, nondistended.  No pulsatile mass.  EXTREMITIES:  He has 2+ femoral and dorsalis pedis pulses bilaterally.   He had a CT angiogram of the abdomen and pelvis today.  This shows no  evidence of endoleak.  The stent graft in good position.  The aneurysm  as well excluded.  Aneurysm diameter has gone from 5 cm to 4.8 cm in  diameter.   Overall Mr. Vincent Velez appears to be doing well at this point.  He will  return in 1 year's time for an abdominal aortic ultrasound.  He will  follow up on an as-needed basis otherwise.  When he returns for follow-  up if his aneurysm is again stable, we will continue yearly ultrasounds  rather than CT angiogram.     Janetta Hora. Fields, MD  Electronically Signed   CEF/MEDQ  D:  05/08/2010  T:  05/09/2010  Job:  [id number]

## 2010-10-14 NOTE — Procedures (Signed)
DUPLEX ULTRASOUND OF ABDOMINAL AORTA   INDICATION:  Follow-up evaluation of abdominal aortic aneurysm.   HISTORY:  Diabetes:  No.  Cardiac:  MI.  Hypertension:  Yes.  Smoking:  The patient smokes a pipe.  Connective Tissue Disorder:  Family History:  Previous Surgery:   DUPLEX EXAM:         AP (cm)                   TRANSVERSE (cm)  Proximal             1.8 cm                    1.7 cm  Mid                  3.0 cm                    3.2 cm  Distal               2.1 cm                    2.2 cm  Right Iliac          1.2 cm                    1.0 cm  Left Iliac           1.2 cm                    1.0 cm   PREVIOUS:  Date:  September 17, 2006  AP:  2.9 cm  TRANSVERSE:  3.1 cm   IMPRESSION:  Abdominal aortic aneurysm stable from previous study.   ___________________________________________  Janetta Hora Fields, MD   MC/MEDQ  D:  03/02/2007  T:  03/03/2007  Job:  [id number]

## 2010-10-14 NOTE — Assessment & Plan Note (Signed)
OFFICE VISIT   Vincent Velez, Vincent Velez  DOB:  [birth year]                                       03/20/2009  [id number]#:[id number]   CHIEF COMPLAINT:  Followup for abdominal aortic aneurysm.   HISTORY OF PRESENT ILLNESS:  The patient is a 70 year old male we have  been following for a small abdominal aortic aneurysm.  On initial  evaluation in 2008 the aneurysm was 3.2 cm in diameter.  In 2009 it was  3.8 cm in diameter.  On most recent ultrasound dated 03/15/2009 in our  office the aneurysm is now 4.9 cm in diameter.  The patient denies any  back or abdominal pain.   His atherosclerotic risk factors continue to remain borderline elevated  cholesterol and pipe smoking in the past.   SOCIAL HISTORY:  He has 2 children.  He is a retired Dance movement psychotherapist.  He does not consume alcohol regularly.   REVIEW OF SYSTEMS:  He denies recent weight loss or gain.  He has no  chest pain or shortness of breath.  He does have a history of mild  bronchitis and asthma.  He also has a history of reflux with mild  dysphagia and chronic intermittent abdominal pain.  He has a history of  prostate cancer and 2 years ago had radiation seeds placed, 5 years ago  had radiation seeds placed.  He has no history of stroke or TIA.  He  does have multiple joint and arthritis pain.   PHYSICAL EXAM:  Vital signs:  Blood pressure 126/89 in the right arm,  pulse is 57 and regular.  Temperature is 97.9, oxygen saturation 99% on  room air.  HEENT:  Unremarkable.  Neck:  Has 2+ carotid pulses without  bruit.  Chest:  Clear to auscultation.  Cardiac:  Exam is regular rate  and rhythm without murmur.  Abdomen:  Soft, nontender, nondistended with  vaguely palpable pulsatile epigastric mass.  Extremities:  He has 2+  femoral pulses bilaterally.  He has no significant edema or joint  deformities.  Skin:  Has no rashes.  Neurological:  Shows symmetric  upper extremity and lower extremity motor strength  which is 5/5  symmetric bilaterally.   The patient's aneurysm has continued to grow steadily over the last 2  years.  The rate of growth has been greater than 1 cm over the past  year.  I believe the best option at this point would be to obtain a CT  angiogram of the abdomen and pelvis with a view towards possibly placing  a stent graft.  I believe the aneurysm growth is rapid enough that we  should consider repair at this time.  He will follow up next week after  his CT angiogram.   Janetta Hora. Fields, MD  Electronically Signed   CEF/MEDQ  D:  03/21/2009  T:  03/22/2009  Job:  [id number]   cc:   Windle Guard, M.D.

## 2010-10-14 NOTE — Assessment & Plan Note (Signed)
OFFICE VISIT   BOOKER, BHATNAGAR  DOB:  28-Jul-1940                                       05/15/2009  ZOXWR#:[id number]   Patient returns for follow-up today after placement of a Gore excluder  aneurysm stent graft on November 11th.  He had an uneventful hospital  stay.  He denies any abdominal or back pain currently.  He has had no  problems with his groin incisions.  He has essentially returned to  normal activities at this point.   He denies any shortness of breath or chest pain currently.   On physical exam, blood pressure is 154/89 in the right arm, heart rate  is 55 and regular.  Temperature is 97.9.  abdomen is soft, nontender,  nondistended with no pulsatile mass.  Extremities:  He has 2+ femoral  and radial pulses bilaterally.   I reviewed his CT angio of the abdomen and pelvis today.  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  There is no obvious evidence of  endoleak, type 1 or type 2.   The patient also had bilateral ABIs today, which I reviewed as well.  This showed an ABI on the right side of 1.23 and on the left of 1.19.  Overall, patient seems to be doing well at this point.  His stent graft  is in good position with no evidence of endoleak.  The aneurysm is well  excluded.  He will follow up in 6 months' time for a repeat CT angio.     Janetta Hora. Fields, MD  Electronically Signed   CEF/MEDQ  D:  05/17/2009  T:  05/17/2009  Job:  [id number]   cc:   Windle Guard, M.D.

## 2010-10-14 NOTE — Assessment & Plan Note (Signed)
OFFICE VISIT   Vincent Velez, Vincent Velez  DOB:  05/27/1941                                       03/27/2009  NUUVO#:[id number]   Patient returns for follow-up today after CT angiogram of his abdomen  and pelvis.  He was last seen on October 20th, at which time an  ultrasound was found to show the aneurysm was 4.9 cm in diameter and had  grown significantly over the last few years.   He returns today for further follow-up.  He continues to deny any  abdominal or back pain.   CT scan abdomen and the pelvis is reviewed today.  This shows a 19 mm  diameter neck, infrarenal in character.  The abdominal aortic aneurysm  is 4.9 cm in diameter, although read out by radiologist as 4.0 cm.  I  believe this was an error in measurement.  Iliac arteries are 11.5 mm in  diameter in the external iliac.  There is some tortuosity to the left  iliac system, but I believe this probably would be navigable for  placement of a Gore excluder stent graft.  The left common iliac artery  is fairly short, 2 cm in length.   On physical exam today, blood pressure is 123/74 in the left arm, heart  rate 66 and regular.  Temperature is 98.  Lower extremity exam shows  palpable 2+ femoral pulses bilaterally.   I had a lengthy discussion with the patient today.  I believe he is a  candidate for endovascular stent graft repair with a Gore excluder  graft.  We will have these films reviewed by the [facility] rep over the next  couple weeks.  He is tentatively scheduled to have his aneurysm repaired  on April 09, 2009.  Risks, benefits, and possible complications and  procedure details were explained to the patient today, including not  limited to bleeding, infection, possibility of not being able to place a  stent graft due to tortuosity of the left iliac system.  I did discuss  with patient the possibility of open repair if we are unable to fix this  with the stent graft.  If it requires open  repair, he would prefer to  defer this until the first of next year.  I do not foresee any potential  problems with the stent graft repair, and I will inform him if we need  to change the decision-making algorithm related to this.   Janetta Hora. Fields, MD  Electronically Signed   CEF/MEDQ  D:  03/27/2009  T:  03/28/2009  Job:  [id number]   cc:   Windle Guard, M.D.

## 2010-10-14 NOTE — Procedures (Signed)
DUPLEX ULTRASOUND OF ABDOMINAL AORTA   INDICATION:  Abdominal aortic aneurysm.   HISTORY:  Diabetes:  No.  Cardiac:  MI.  Hypertension:  Yes.  Smoking:  Yes.  Connective Tissue Disorder:  Family History:  No.  Previous Surgery:  No.   DUPLEX EXAM:         AP (cm)                   TRANSVERSE (cm)  Proximal             2.9 cm                    2.8 cm  Mid                  2.4 cm                    2.3 cm  Distal               4.9 cm                    4.0 cm  Right Iliac          Not visualized            Not visualized  Left Iliac           Not visualized            Not visualized   PREVIOUS:  Date:  02/22/2008  AP:  3.8  TRANSVERSE:  3.4   IMPRESSION:  1. Aneurysm of the distal abdominal aorta noted which appears      significantly increased when compared to the previous exam.  Non      flow obstructing mural thrombus is noted in the aneurysmal sac.  2. Unable to adequately visualize the bilateral common iliac arteries      due to overlying bowel gas patterns.   ___________________________________________  Janetta Hora Fields, MD   CH/MEDQ  D:  03/15/2009  T:  03/16/2009  Job:  [id number]

## 2010-10-14 NOTE — Procedures (Signed)
DUPLEX ULTRASOUND OF ABDOMINAL AORTA   INDICATION:  Follow-up evaluation of known abdominal aortic aneurysm.   HISTORY:  Diabetes:  No.  Cardiac:  MI.  Hypertension:  Yes.  Smoking:  Patient smokes a pipe.  Connective Tissue Disorder:  Family History:  Previous Surgery:   DUPLEX EXAM:         AP (cm)                   TRANSVERSE (cm)  Proximal             1.6 cm                    2.0 cm  Mid                  3.8 cm                    3.4 cm  Distal               1.9 cm                    2.1 cm  Right Iliac          0.8 cm                    0.9 cm  Left Iliac           1.1 cm                    0.9 cm   PREVIOUS:  Date: 03/02/07  AP:  3.0  TRANSVERSE:  3.2   IMPRESSION:  Abdominal aortic aneurysm is slightly larger than  previously recorded.   ___________________________________________  Janetta Hora Fields, MD   MC/MEDQ  D:  02/22/2008  T:  02/22/2008  Job:  [id number]

## 2010-10-14 NOTE — Letter (Signed)
March 02, 2007   Windle Guard, M.D.  [address]  [place], [place] [address]   Re:  Vincent, Velez                DOB:  1940-12-16   Dear Dr. Lillia Mountain,   Vincent Velez returned for a followup ultrasound of his abdominal aortic  aneurysm today.  His aneurysm was 3.2 cm in diameter.  This is  essentially unchanged from April, 2008.  He will follow up every 6  months for repeat ultrasound.   Thank you for allowing me to participate in his care.   Sincerely,   Janetta Hora. Fields, MD  Electronically Signed   CEF/MEDQ  D:  03/02/2007  T:  03/03/2007  Job:  [id number]

## 2010-10-17 ENCOUNTER — Telehealth: Payer: Self-pay | Admitting: *Deleted

## 2010-10-17 ENCOUNTER — Other Ambulatory Visit (INDEPENDENT_AMBULATORY_CARE_PROVIDER_SITE_OTHER): Payer: 59

## 2010-10-17 ENCOUNTER — Encounter: Payer: Self-pay | Admitting: Gastroenterology

## 2010-10-17 ENCOUNTER — Ambulatory Visit (INDEPENDENT_AMBULATORY_CARE_PROVIDER_SITE_OTHER): Payer: Medicare Other | Admitting: Gastroenterology

## 2010-10-17 VITALS — BP 134/80 | HR 60 | Ht 66.0 in | Wt 205.0 lb

## 2010-10-17 DIAGNOSIS — D500 Iron deficiency anemia secondary to blood loss (chronic): Secondary | ICD-10-CM

## 2010-10-17 DIAGNOSIS — K219 Gastro-esophageal reflux disease without esophagitis: Secondary | ICD-10-CM

## 2010-10-17 DIAGNOSIS — R6889 Other general symptoms and signs: Secondary | ICD-10-CM

## 2010-10-17 LAB — MAGNESIUM: Magnesium: 2 mg/dL (ref 1.5–2.5)

## 2010-10-17 NOTE — Op Note (Signed)
Vincent, Velez NO.:  [id number]   MEDICAL RECORD NO.:  [id number]          PATIENT TYPE:  AMB   LOCATION:  [facility]                         FACILITY:  [facility]   PHYSICIAN:  Bertram Millard. Dahlstedt, M.D.DATE OF BIRTH:  [birth year]   DATE OF PROCEDURE:  09/08/2004  DATE OF DISCHARGE:                                 OPERATIVE REPORT   PREOPERATIVE DIAGNOSIS:  Adenocarcinoma of the prostate, clinical stage T1C,  Gleason's score 3+3.   POSTOPERATIVE DIAGNOSIS:  Adenocarcinoma of the prostate, clinical stage  T1C, Gleason's score 3+3.   PROCEDURE:  Placement of I-125 seeds in prostate.   SURGEON:  Bertram Millard. Dahlstedt, M.D.   RADIATION ONCOLOGIST:  Artist Pais. Kathrynn Running, M.D.   ANESTHESIA:  General.   COMPLICATIONS:  None.   ESTIMATED BLOOD LOSS:  Minimal.   INDICATIONS FOR PROCEDURE:  This 70 year old gentleman carries a diagnosis  of adenocarcinoma of the prostate.  He was first seen by me on June 12, 2004.  He had been seen by Dr. Alphia Moh in the past.  He has a history  of a borderline high PSA, with his most recent PSA being just over 5.  He  underwent an ultrasound and biopsy of the prostate in February, which  revealed a prostatic volume of approximately 30 mL.  He was found to have a  minute focus of adenocarcinoma on the right.  Biopsy with 20% of the tissue  on the left submitted, showing a Gleason 3+3.  The patient was counseled by  me in the office and had a consultation with Dr. Jeanice Lim.  The  patient has chosen to have brachy therapy.  He presents at this time for  that procedure.  He is aware of the risks and complications of  brachytherapy, as well as surgery.  He desires to proceed.   DESCRIPTION OF PROCEDURE:  The patient was administered preoperative IV  antibiotics and was taken to the operating room where a general anesthetic  was administered.  He was placed in the lithotomy position.  All extremities  were padded  appropriately.  His perineum was prepared and a Foley catheter  was placed transurethrally with contrast in the balloon.  The transrectal  ultrasound probe was then placed.  The prostate was scanned and this  corresponded to the volumetric measurement from August 07, 2004.  Using the  computerized template, the prostate, urethra and rectum were identified in  each of the nine scans.  The template was placed against the patient's  perineum.  The stabilizing needles were placed appropriately.  The prostate  was then measured on the fluoroscopic screen.  The needle loading report was  modified, with three needles moved.  Needle #2, #3 and #4 were each moved to  c4.5, d4.5 and e4.0 respectively.  Using the needle-loading report, which  had been modified, a total of 80 seeds were placed using 25 needles.  Fluoroscopic and ultrasonographic guidance was utilized to assist in proper  placement.  For the specifics of needle position and seed placement, please  refer to the needle-loading report.   Following  adequate positioning of all 80 seeds using the 25 needles, the  template was removed after the stabilizing needles were removed.  The  catheter was removed and the bladder and urethra were examined  cystoscopically.  No free seeds were seen.  At this point a Foley catheter  was reinserted and hooked to dependent drainage.   The patient tolerated the procedure well.  He is awakened, extubated, and  taken to the PACU instable condition.   He will follow up in approximately three weeks.  He was discharged on his  usual medications, including his 81 mg of aspirin plus hydrocodone/A-PAP one  p.o. q.4h. p.r.n. pain (#20) and Cipro 250 mg, one p.o. b.i.d. (#10).      SMD/MEDQ  D:  09/08/2004  T:  09/08/2004  Job:  [id number]   cc:   Artist Pais Kathrynn Running, M.D.  [address] [facility]  [place]  [place] [address]  Fax: [phone]

## 2010-10-17 NOTE — Assessment & Plan Note (Signed)
OFFICE VISIT   NARVEL, KOZUB  DOB:  1940/08/19                                       11/13/2009  ZOXWR#:[id number]   He has returned to his usual activities and has a normal appetite.  Denies weight gain or loss.   Chronic medical problems continue to include COPD, elevated cholesterol,  diabetes.  These are all currently controlled.  She send a carbon copy  of this may also to Dr.:  He is the patient's primary day.   FAMILY HISTORY:  Remarkable for his mother who had vascular disease at a  young age.   SOCIAL HISTORY:  He is married and has 2 children.  He is retired.  He  is a former smoker.  He does not seem alcohol regularly.   REVIEW OF SYSTEMS:  A 12 point review of systems was performed on the  patient today.  Please see intake referral form for details regarding  this.   ALLERGIES:  He has allergy listed to mycins.   PHYSICAL EXAMINATION:  Blood pressure is 156/97 in the left arm, heart  rate is 59 and regular.  Temperature is 98.1.  HEENT:  Unremarkable.  NECK:  Has 2+ carotid pulses without bruit.  CHEST:  Clear to auscultation.  CARDIAC:  Regular rate and rhythm without murmur.  ABDOMEN:  Soft, nontender, nondistended.  No masses.  EXTREMITIES:  He has 2+ femoral pulses bilaterally.  SKIN:  Has no ulcers or rashes.   CT angiogram of the abdomen and pelvis was reviewed.  All images were  reviewed as well as the report today.  This shows his stent graft is in  good position approximately 5 mm below the right renal artery.  There is  no evidence of endoleak.  The aneurysm diameter is now 4.6 cm.  This  represents a decrease in size from 5 cm at time of repair of the  aneurysm.   In summary, Mr. Ysaguirre has a well-sealed Gore excluder stent graft and  is doing well overall.  He will follow up in six months time for repeat  CT angio.  At that time if everything is stable will go to once yearly.     Janetta Hora. Fields, MD  Electronically Signed   CEF/MEDQ  D:  11/14/2009  T:  11/14/2009  Job:  [id number]

## 2010-10-17 NOTE — Progress Notes (Signed)
This is a pleasant 70 year old Caucasian male with chronic acid reflux doing well on daily Nexium 40 mg. He denies O. Biller lower gastrointestinal problems, dysphagia, anorexia or weight loss. He is on aspirin and has a history of hypertensive cardiovascular disease. Last endoscopy and colonoscopies were in October of 2006. At that time he had dilation of an esophageal stricture. Colonoscopy was unremarkable.  Current Medications, Allergies, Past Medical History, Past Surgical History, Family History and Social History were reviewed in [employer] record.  Pertinent Review of Systems Negative.. recurrent cardiovascular or genitourinary or general medical symptoms.   Physical Exam: Awake and alert no acute distress. Chest is clear cardiac exam shows a 2/6 systolic ejection murmur without S3 gallop. There is no organomegaly, abdominal masses or tenderness. Bowel sounds are normal. Mental status is normal.    Assessment and Plan: Chronic GERD doing well on daily Nexium therapy. I have advised Caltrate 600 with vitamin D twice a day, have renewed his Protonix, and we will check magnesium, CBC metabolic profile, and anemia profile. He is continues other medications per Dr. Nehemiah Settle, primary care physician.  Please copy her primary care physician, referring physician, and pertinent subspecialists.  Encounter Diagnoses  Name Primary?  . Esophageal reflux Yes  . Iron deficiency anemia secondary to blood loss (chronic)   . General symptom    . Other general symptoms

## 2010-10-17 NOTE — Procedures (Signed)
[facility]                            ABDOMINAL ULTRASOUND REPORT   NAME:Vincent Velez, Vincent Velez                       MRN:          [id number]  DATE:01/23/2006                            DOB:          March 22, 1941    ACCESSION NUMBER:  [id number].   ORDERING PHYSICIAN:  Sheryn Bison, M.D.   READING PHYSICIAN:  Claudette Head, M.D.   RESULTS:  Abdominal aorta measures 3.6 cm in maximal diameter with  aneurysmal dilatation in the mid portion. The abdominal aorta appears  normal.  The IVC is patent.   The pancreas is not imaged due to overlying gas.   Gallbladder is well distended, wall thickness 2.2 mm, with no  pericholecystic fluid or intraluminal echogenic foci to suggest gallstone  disease. The common bile duct measures 3.9 mm in maximal diameter and  appears normal without evidence of intraluminal foci.   The liver appears normal without evidence of parenchymal lesion, ductal  dilatation or vascular abnormality. It has a moderate increase in echo-  density.   Kidneys measure 9.3 cm right, 12 cm left.  Both kidneys are normal in  appearance.   The spleen is normal in size, measuring 11.2 cm without parenchymal lesion.   IMPRESSION:  1. 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  2. Moderately increased hepatic echo-density.  3. Pancrease not imaged due to overlying gas.                                   Venita Lick. Pleas Koch., MD, [facility]   MTS/MedQ  DD:  01/23/2006  DT:  01/24/2006  Job #:  [id number]   cc:   Vania Rea. Jarold Motto, MD, [place], [place]

## 2010-10-17 NOTE — Telephone Encounter (Signed)
Message copied by Graciella Freer on Fri Oct 17, 2010  4:35 PM ------      Message from: Jarold Motto, DAVID      Created: Fri Oct 17, 2010  3:56 PM       NEEDS B12 SHOTS.Marland KitchenMarland Kitchen

## 2010-10-17 NOTE — Telephone Encounter (Signed)
lmom for pt to call back. Per Dr Joni Reining, he needs B12 injections.

## 2010-10-17 NOTE — Op Note (Signed)
[facility]  Patient:    Vincent Velez, Vincent Velez Visit Number: [id number] MRN: [id number]          Service Type: DSU Location: [facility] Attending Physician:  Susa Day Dictated by:   Katy Fitch Naaman Plummer., M.D. Proc. Date: 09/23/99 Admit Date:  09/23/1999   CC:         Katy Fitch. Sypher, Montez Hageman., M.D. 2 copies                           Operative Report  PREOPERATIVE DIAGNOSIS:  Status post severe crushing injury to right long finger, distal metaphalangeal segments with comminuted open intra-articular fracture of distal phalanx at DIP joint, and avulsion of the profundus insertion with proximal migration of the palmar articular surface of the distal phalanx into the flexor sheath to the A5 and A4 pulleys.  POSTOPERATIVE DIAGNOSIS:  Status post severe crushing injury to right long finger, distal metaphalangeal segments with comminuted open intra-articular fracture of distal phalanx at DIP joint, and avulsion of the profundus insertion with proximal migration of the palmar articular surface of the distal phalanx into the flexor sheath to the A5 and A4 pulleys.  OPERATION:  Irrigation and debridement of right long finger open fracture and severely contaminated wound, followed by open reduction internal fixation of comminuted intra-articular fracture of right long finger distal phalanx, and repair of flexor digitorum profundus avulsion, right long finger.  SURGEON:  Dr. Josephine Igo.  ANESTHESIA:  General.  Supervised by the anesthesiologist, Dr. Krista Blue.  INDICATIONS:  Vincent Velez is a 70 year old man who was visiting [employer] earlier today.  He was trying to get some wire, and somehow became involved in injury in which the wire fell crushing his finger.  He sustained a severely comminuted open fracture of his right long finger, distal phalanx, intra-articular DIP joint, with avulsion of the insertion of the profundus tendon, and proximal  migration of the palmar joint fragment into the flexor sheath.  He was seen in the [facility] Emergency Room by Dr. Effie Shy, who noted a past history of asthma.  He was noted to be allergic to ERYTHROMYCIN.  After x-rays revealed the comminuted fracture predicament, Dr. Effie Shy requested a hand surgery consult.  DESCRIPTION OF PROCEDURE:  Vincent Vele[facility] care of his wound.  After anesthesia consult with Dr. Krista Blue, he was brought to the operating room and placed under general anesthesia.  The right arm was prepped with Betadine solution and sterilely draped.  Following exsanguination of the right arm with an Esmarch bandage the arterial tourniquet was inflated to 220 mmHg.  The procedure commenced with copious irrigation of the wound, followed by debridement of greasy materials from the wound.  The fracture and tendon predicament were noted.  A 2-0 Prolene suture was used with a gathering stitch to recover the flexor tendon fragment and placed distal traction.  A 27 gauge needle was then used through the A4 pulley to secure the tendon in an anatomic position.  The comminuted fracture was debrided and reduced anatomically with respect to the dorsal and distal fragments.  This was secured with a 0.035 inch wire through the middle phalangeal head.  The fragments holding the flexor digitorum profundus insertion was then brought out of the flexor sheath, and secured with a 5-0 stainless steel pull out wire brought out through the nail dorsally.  This led to a near anatomic  reduction of the joint surface.  Due to severe comminution, an exact anatomic reduction was not possible.  The flexor tendon insertion was reestablished at the base of the distal phalanx with this technique, however.  The wound was copiously irrigated with sterile saline.  The radial and ulnar neurovascular bundles were carefully lysed.  The nerve had been severely stretched.  The  radial property artery will probably thrombose, as it is rather significantly bruised.  The ulnar property artery nerve were intact.  The wound was then repaired with two corner sutures of 5-0 Nylon.  This was so contaminated that I did not attempt to repair the wound further.  The wound was dressed with xeroform, sterile gauze, fluff gauze, and a ______ gauze hand dressing with a volar plastic splint stabilizing the wrist in 5 degrees dorsiflexion.  There were no apparent complications.  Vincent Velez had a second wound in his ring finger that was ______ and rather contaminated.  This was debrided and left opened to heal by secondary intention.  He will be discharged home with prescriptions for Percocet one or two tablets p.o. q.4-6h. p.r.n. pain, also Keflex 500 mg one p.o. q.8h. x 4 days as prophylactic antibiotic.  He will return to our office for follow up in three days or sooner p.r.n. problems. Dictated by:   Katy Fitch Naaman Plummer., M.D. Attending Physician:  Susa Day DD:  09/23/99 TD:  09/24/99 Job: [id number]

## 2010-10-17 NOTE — Assessment & Plan Note (Signed)
[facility]                           GASTROENTEROLOGY OFFICE NOTE   NAME:Vincent Velez, Vincent Velez                       MRN:          [id number]  DATE:01/21/2006                            DOB:          [birth year]    Vincent Velez is really having no reflux symptoms, but has some current substernal  pain periodically, without specific hepatobiliary or lower GI complaints.  His appetite is good, and his weight is stable.  He denies lower abdominal  problems.   His previous endoscopy in October 2006 showed a hiatal hernia with a peptic  structure which was dilated.  He no longer has dysphagia.  Colonoscopy was  normal.   PHYSICAL EXAMINATION:  VITAL SIGNS:  He weighs 201 pounds, and blood  pressure 110/58.  Pulse was 68 and regular.  GENERAL:  Could not appreciate stigmata of chronic liver disease.  CHEST:  Clear.  CARDIAC:  Unremarkable, without gallops or rubs, although he did have a  midsystolic murmur at the left lower sternal border.  ABDOMEN:  There was no organomegaly, abdominal masses, or tenderness.   ASSESSMENT:  Vincent Velez has chronic acid reflux, which is managed by taking  Nexium 40 mg a day.  He relates that this may be causing some of his  abdominal pain, but I think we need to exclude cholelithiasis.   RECOMMENDATIONS:  1. Renew Nexium, and reflux regime reviewed.  2. Check screening laboratory parameters.  3. Outpatient upper abdominal ultrasound exam.  4. Other medications as per his primary care physicians.                                   Vania Rea. Jarold Motto, MD, [place] [place], [place]   DRP/MedQ  DD:  01/21/2006  DT:  01/21/2006  Job #:  [id number]

## 2010-10-17 NOTE — Patient Instructions (Signed)
Please go to the basement today for your labs.   

## 2010-10-20 ENCOUNTER — Telehealth: Payer: Self-pay | Admitting: *Deleted

## 2010-10-20 NOTE — Telephone Encounter (Signed)
Lmom for pt to call back. 

## 2010-10-20 NOTE — Telephone Encounter (Signed)
Message copied by Graciella Freer on Mon Oct 20, 2010  8:34 AM ------      Message from: Jarold Motto, DAVID      Created: Fri Oct 17, 2010  3:56 PM       NEEDS B12 SHOTS.Marland KitchenMarland Kitchen

## 2010-10-23 ENCOUNTER — Encounter: Payer: Self-pay | Admitting: *Deleted

## 2010-10-24 ENCOUNTER — Other Ambulatory Visit: Payer: 59

## 2010-10-28 ENCOUNTER — Telehealth: Payer: Self-pay | Admitting: *Deleted

## 2010-10-28 ENCOUNTER — Other Ambulatory Visit (INDEPENDENT_AMBULATORY_CARE_PROVIDER_SITE_OTHER): Payer: 59 | Admitting: Gastroenterology

## 2010-10-28 DIAGNOSIS — Z1289 Encounter for screening for malignant neoplasm of other sites: Secondary | ICD-10-CM

## 2010-10-28 LAB — FECAL OCCULT BLOOD, IMMUNOCHEMICAL: Fecal Occult Bld: NEGATIVE

## 2010-10-28 NOTE — Telephone Encounter (Signed)
Scheduled pt for his 1st of 3 weekly B12 injections. 10/30/10 at 1:30pm

## 2010-10-30 ENCOUNTER — Ambulatory Visit: Payer: Medicare Other | Admitting: Gastroenterology

## 2010-10-30 MED ORDER — CYANOCOBALAMIN 1000 MCG/ML IJ SOLN: 1000.0000 ug | INTRAMUSCULAR | Status: DC

## 2011-01-30 ENCOUNTER — Telehealth: Payer: Self-pay | Admitting: Gastroenterology

## 2011-01-30 MED ORDER — ESOMEPRAZOLE MAGNESIUM 40 MG PO CPDR: 40.0000 mg | DELAYED_RELEASE_CAPSULE | Freq: Every day | ORAL | Status: DC

## 2011-01-30 NOTE — Telephone Encounter (Signed)
rx sent

## 2011-07-28 DIAGNOSIS — E119 Type 2 diabetes mellitus without complications: Secondary | ICD-10-CM | POA: Diagnosis not present

## 2011-07-28 DIAGNOSIS — E785 Hyperlipidemia, unspecified: Secondary | ICD-10-CM | POA: Diagnosis not present

## 2011-07-28 DIAGNOSIS — I10 Essential (primary) hypertension: Secondary | ICD-10-CM | POA: Diagnosis not present

## 2011-07-28 DIAGNOSIS — J449 Chronic obstructive pulmonary disease, unspecified: Secondary | ICD-10-CM | POA: Diagnosis not present

## 2011-08-03 DIAGNOSIS — I714 Abdominal aortic aneurysm, without rupture: Secondary | ICD-10-CM | POA: Diagnosis not present

## 2011-08-03 DIAGNOSIS — I10 Essential (primary) hypertension: Secondary | ICD-10-CM | POA: Diagnosis not present

## 2011-08-03 DIAGNOSIS — E119 Type 2 diabetes mellitus without complications: Secondary | ICD-10-CM | POA: Diagnosis not present

## 2011-08-03 DIAGNOSIS — E785 Hyperlipidemia, unspecified: Secondary | ICD-10-CM | POA: Diagnosis not present

## 2011-08-03 DIAGNOSIS — F17200 Nicotine dependence, unspecified, uncomplicated: Secondary | ICD-10-CM | POA: Diagnosis not present

## 2011-08-03 DIAGNOSIS — I359 Nonrheumatic aortic valve disorder, unspecified: Secondary | ICD-10-CM | POA: Diagnosis not present

## 2011-09-17 DIAGNOSIS — E538 Deficiency of other specified B group vitamins: Secondary | ICD-10-CM | POA: Diagnosis not present

## 2011-10-05 DIAGNOSIS — Z961 Presence of intraocular lens: Secondary | ICD-10-CM | POA: Diagnosis not present

## 2011-10-05 DIAGNOSIS — H4011X0 Primary open-angle glaucoma, stage unspecified: Secondary | ICD-10-CM | POA: Diagnosis not present

## 2011-10-05 DIAGNOSIS — H409 Unspecified glaucoma: Secondary | ICD-10-CM | POA: Diagnosis not present

## 2011-10-05 DIAGNOSIS — E119 Type 2 diabetes mellitus without complications: Secondary | ICD-10-CM | POA: Diagnosis not present

## 2011-10-19 DIAGNOSIS — E538 Deficiency of other specified B group vitamins: Secondary | ICD-10-CM | POA: Diagnosis not present

## 2011-10-22 DIAGNOSIS — C61 Malignant neoplasm of prostate: Secondary | ICD-10-CM | POA: Diagnosis not present

## 2011-10-22 DIAGNOSIS — N400 Enlarged prostate without lower urinary tract symptoms: Secondary | ICD-10-CM | POA: Diagnosis not present

## 2011-11-20 DIAGNOSIS — E538 Deficiency of other specified B group vitamins: Secondary | ICD-10-CM | POA: Diagnosis not present

## 2011-12-24 DIAGNOSIS — F17200 Nicotine dependence, unspecified, uncomplicated: Secondary | ICD-10-CM | POA: Diagnosis not present

## 2011-12-24 DIAGNOSIS — E785 Hyperlipidemia, unspecified: Secondary | ICD-10-CM | POA: Diagnosis not present

## 2011-12-24 DIAGNOSIS — I10 Essential (primary) hypertension: Secondary | ICD-10-CM | POA: Diagnosis not present

## 2011-12-24 DIAGNOSIS — E538 Deficiency of other specified B group vitamins: Secondary | ICD-10-CM | POA: Diagnosis not present

## 2011-12-24 DIAGNOSIS — E119 Type 2 diabetes mellitus without complications: Secondary | ICD-10-CM | POA: Diagnosis not present

## 2011-12-24 DIAGNOSIS — J449 Chronic obstructive pulmonary disease, unspecified: Secondary | ICD-10-CM | POA: Diagnosis not present

## 2012-02-02 ENCOUNTER — Other Ambulatory Visit: Payer: Self-pay | Admitting: Gastroenterology

## 2012-02-03 NOTE — Telephone Encounter (Signed)
I notified patient he last saw Dr. Jarold Motto May 2012 and he needs a yearly visit to continue refills on his Nexium and we could refill #30 until he is seen. He states he sees his primary care M.D.every 4 months and will get them to refill this prescription.

## 2012-02-05 ENCOUNTER — Telehealth: Payer: Self-pay | Admitting: Gastroenterology

## 2012-02-05 NOTE — Telephone Encounter (Signed)
Message sent to me for FYI only.

## 2012-03-16 DIAGNOSIS — Z23 Encounter for immunization: Secondary | ICD-10-CM | POA: Diagnosis not present

## 2012-04-05 DIAGNOSIS — H4011X0 Primary open-angle glaucoma, stage unspecified: Secondary | ICD-10-CM | POA: Diagnosis not present

## 2012-04-05 DIAGNOSIS — H409 Unspecified glaucoma: Secondary | ICD-10-CM | POA: Diagnosis not present

## 2012-04-25 DIAGNOSIS — I714 Abdominal aortic aneurysm, without rupture: Secondary | ICD-10-CM | POA: Diagnosis not present

## 2012-04-25 DIAGNOSIS — E119 Type 2 diabetes mellitus without complications: Secondary | ICD-10-CM | POA: Diagnosis not present

## 2012-04-25 DIAGNOSIS — F17200 Nicotine dependence, unspecified, uncomplicated: Secondary | ICD-10-CM | POA: Diagnosis not present

## 2012-04-25 DIAGNOSIS — I10 Essential (primary) hypertension: Secondary | ICD-10-CM | POA: Diagnosis not present

## 2012-04-25 DIAGNOSIS — E785 Hyperlipidemia, unspecified: Secondary | ICD-10-CM | POA: Diagnosis not present

## 2012-05-06 ENCOUNTER — Other Ambulatory Visit: Payer: Self-pay | Admitting: *Deleted

## 2012-05-06 DIAGNOSIS — I714 Abdominal aortic aneurysm, without rupture: Secondary | ICD-10-CM

## 2012-05-09 ENCOUNTER — Encounter: Payer: Self-pay | Admitting: Neurosurgery

## 2012-05-10 ENCOUNTER — Encounter (INDEPENDENT_AMBULATORY_CARE_PROVIDER_SITE_OTHER): Payer: Medicare Other | Admitting: *Deleted

## 2012-05-10 ENCOUNTER — Encounter: Payer: Self-pay | Admitting: Neurosurgery

## 2012-05-10 ENCOUNTER — Ambulatory Visit (INDEPENDENT_AMBULATORY_CARE_PROVIDER_SITE_OTHER): Payer: Medicare Other | Admitting: Neurosurgery

## 2012-05-10 VITALS — BP 138/78 | HR 47 | Resp 16 | Ht 70.5 in | Wt 200.0 lb

## 2012-05-10 DIAGNOSIS — I714 Abdominal aortic aneurysm, without rupture: Secondary | ICD-10-CM

## 2012-05-10 DIAGNOSIS — Z48812 Encounter for surgical aftercare following surgery on the circulatory system: Secondary | ICD-10-CM

## 2012-05-10 NOTE — Progress Notes (Signed)
[facility] [place] AAA/Carotid Office Note  CC: AAA stent graft surveillance Referring Physician: Fields  History of Present Illness: 71 year old male patient of Dr. 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 The patient denies any unusual abdominal or back pain. The patient denies any new medical diagnoses or recent surgery.  Past Medical History  Diagnosis Date  . Allergy, unspecified not elsewhere classified   . Arthropathy, unspecified, site unspecified   . Malignant neoplasm of prostate   . Family history of malignant neoplasm of gastrointestinal tract   . Esophageal reflux   . Stricture and stenosis of esophagus   . Hiatal hernia   . Hypertension   . Hyperlipemia   . Diabetes mellitus     ROS: [x]  Positive   [ ]  Denies    General: [ ]  Weight loss, [ ]  Fever, [ ]  chills Neurologic: [ ]  Dizziness, [ ]  Blackouts, [ ]  Seizure [ ]  Stroke, [ ]  "Mini stroke", [ ]  Slurred speech, [ ]  Temporary blindness; [ ]  weakness in arms or legs, [ ]  Hoarseness Cardiac: [ ]  Chest pain/pressure, [ ]  Shortness of breath at rest [ ]  Shortness of breath with exertion, [ ]  Atrial fibrillation or irregular heartbeat Vascular: [ ]  Pain in legs with walking, [ ]  Pain in legs at rest, [ ]  Pain in legs at night,  [ ]  Non-healing ulcer, [ ]  Blood clot in vein/DVT,   Pulmonary: [ ]  Home oxygen, [ ]  Productive cough, [ ]  Coughing up blood, [ ]  Asthma,  [ ]  Wheezing Musculoskeletal:  [ ]  Arthritis, [ ]  Low back pain, [ ]  Joint pain Hematologic: [ ]  Easy Bruising, [ ]  Anemia; [ ]  Hepatitis Gastrointestinal: [ ]  Blood in stool, [ ]  Gastroesophageal Reflux/heartburn, [ ]  Trouble swallowing Urinary: [ ]  chronic Kidney disease, [ ]  on HD - [ ]  MWF or [ ]  TTHS, [ ]  Burning with urination, [ ]  Difficulty urinating Skin: [ ]  Rashes, [ ]  Wounds Psychological: [ ]  Anxiety, [ ]  Depression   Social History History  Substance Use Topics  . Smoking status: Never Smoker   .  Smokeless tobacco: Never Used  . Alcohol Use: No    Family History Family History  Problem Relation Age of Onset  . Colon cancer Father   . Cancer Father   . Diabetes Mother   . Heart disease Mother     No Known Allergies  Current Outpatient Prescriptions  Medication Sig Dispense Refill  . aspirin 81 MG tablet Take 81 mg by mouth daily.        . brinzolamide (AZOPT) 1 % ophthalmic suspension Place 1 drop into both eyes 3 (three) times daily.        Marland Kitchen esomeprazole (NEXIUM) 40 MG capsule Take 1 capsule (40 mg total) by mouth daily.  90 capsule  3  . glimepiride (AMARYL) 1 MG tablet Take 1 mg by mouth daily before breakfast.        . lisinopril (PRINIVIL,ZESTRIL) 5 MG tablet Take 5 mg by mouth daily.        . Multiple Vitamin (MULTIVITAMIN) capsule Take 1 capsule by mouth daily.        . Omega-3 Fatty Acids (FISH OIL) 1000 MG CAPS Take 1 capsule by mouth daily.        . simvastatin (ZOCOR) 40 MG tablet Take 40 mg by mouth at bedtime.          Physical Examination  Filed Vitals:  05/10/12 1001  BP: 138/78  Pulse: 47  Resp: 16    Body mass index is 28.29 kg/(m^2).  General:  WDWN in NAD Gait: Normal HEENT: WNL Eyes: Pupils equal Pulmonary: normal non-labored breathing , without Rales, rhonchi,  wheezing Cardiac: RRR, without  Murmurs, rubs or gallops; Abdomen: soft, NT, no masses Skin: no rashes, ulcers noted  Vascular Exam Pulses: 3+ radial pulses bilaterally, palpable femoral pulses bilaterally, there is no abdominal mass palpated Carotid bruits: Carotid pulses to auscultation no bruits are heard Extremities without ischemic changes, no Gangrene , no cellulitis; no open wounds;  Musculoskeletal: no muscle wasting or atrophy   Neurologic: A&O X 3; Appropriate Affect ; SENSATION: normal; MOTOR FUNCTION:  moving all extremities equally. Speech is fluent/normal  Non-Invasive Vascular Imaging AAA duplex today shows a maximum diameter of 4.16 AP and there is no  endoleak detected, the graft is patent. There is no previous exam to compare  ASSESSMENT/PLAN: Asymptomatic patient 3 year status post endograft repair of a AAA. The patient will followup in one year with repeat AAA duplex. The patient's questions were encouraged and answered, he is in agreement with this plan.  Lauree Chandler ANP   Clinic MD: Early

## 2012-05-11 NOTE — Addendum Note (Signed)
Addended by: Sharee Pimple on: 05/11/2012 02:53 PM   Modules accepted: Orders

## 2012-08-01 DIAGNOSIS — I714 Abdominal aortic aneurysm, without rupture: Secondary | ICD-10-CM | POA: Diagnosis not present

## 2012-08-01 DIAGNOSIS — I498 Other specified cardiac arrhythmias: Secondary | ICD-10-CM | POA: Diagnosis not present

## 2012-08-01 DIAGNOSIS — I10 Essential (primary) hypertension: Secondary | ICD-10-CM | POA: Diagnosis not present

## 2012-08-01 DIAGNOSIS — F17200 Nicotine dependence, unspecified, uncomplicated: Secondary | ICD-10-CM | POA: Diagnosis not present

## 2012-08-01 DIAGNOSIS — E785 Hyperlipidemia, unspecified: Secondary | ICD-10-CM | POA: Diagnosis not present

## 2012-08-01 DIAGNOSIS — I359 Nonrheumatic aortic valve disorder, unspecified: Secondary | ICD-10-CM | POA: Diagnosis not present

## 2012-08-23 DIAGNOSIS — I10 Essential (primary) hypertension: Secondary | ICD-10-CM | POA: Diagnosis not present

## 2012-08-23 DIAGNOSIS — F17200 Nicotine dependence, unspecified, uncomplicated: Secondary | ICD-10-CM | POA: Diagnosis not present

## 2012-08-23 DIAGNOSIS — I359 Nonrheumatic aortic valve disorder, unspecified: Secondary | ICD-10-CM | POA: Diagnosis not present

## 2012-08-23 DIAGNOSIS — E119 Type 2 diabetes mellitus without complications: Secondary | ICD-10-CM | POA: Diagnosis not present

## 2012-08-23 DIAGNOSIS — I498 Other specified cardiac arrhythmias: Secondary | ICD-10-CM | POA: Diagnosis not present

## 2012-08-23 DIAGNOSIS — I714 Abdominal aortic aneurysm, without rupture: Secondary | ICD-10-CM | POA: Diagnosis not present

## 2012-08-23 DIAGNOSIS — E785 Hyperlipidemia, unspecified: Secondary | ICD-10-CM | POA: Diagnosis not present

## 2012-09-06 DIAGNOSIS — I498 Other specified cardiac arrhythmias: Secondary | ICD-10-CM | POA: Diagnosis not present

## 2012-09-06 DIAGNOSIS — I10 Essential (primary) hypertension: Secondary | ICD-10-CM | POA: Diagnosis not present

## 2012-09-06 DIAGNOSIS — I714 Abdominal aortic aneurysm, without rupture: Secondary | ICD-10-CM | POA: Diagnosis not present

## 2012-10-03 DIAGNOSIS — H4011X0 Primary open-angle glaucoma, stage unspecified: Secondary | ICD-10-CM | POA: Diagnosis not present

## 2012-11-16 DIAGNOSIS — N400 Enlarged prostate without lower urinary tract symptoms: Secondary | ICD-10-CM | POA: Diagnosis not present

## 2012-11-16 DIAGNOSIS — C61 Malignant neoplasm of prostate: Secondary | ICD-10-CM | POA: Diagnosis not present

## 2013-04-03 DIAGNOSIS — Z23 Encounter for immunization: Secondary | ICD-10-CM | POA: Diagnosis not present

## 2013-04-04 DIAGNOSIS — H4011X0 Primary open-angle glaucoma, stage unspecified: Secondary | ICD-10-CM | POA: Diagnosis not present

## 2013-04-04 DIAGNOSIS — H409 Unspecified glaucoma: Secondary | ICD-10-CM | POA: Diagnosis not present

## 2013-05-10 ENCOUNTER — Ambulatory Visit: Payer: Medicare Other | Admitting: Family

## 2013-05-10 ENCOUNTER — Other Ambulatory Visit (HOSPITAL_COMMUNITY): Payer: Medicare Other

## 2013-05-11 ENCOUNTER — Other Ambulatory Visit (HOSPITAL_COMMUNITY): Payer: Medicare Other

## 2013-05-11 ENCOUNTER — Ambulatory Visit: Payer: Medicare Other | Admitting: Family

## 2013-05-11 ENCOUNTER — Encounter (HOSPITAL_COMMUNITY): Payer: Medicare Other

## 2013-05-18 ENCOUNTER — Encounter: Payer: Self-pay | Admitting: Family

## 2013-05-19 ENCOUNTER — Encounter: Payer: Self-pay | Admitting: Family

## 2013-05-19 ENCOUNTER — Ambulatory Visit (HOSPITAL_COMMUNITY)
Admission: RE | Admit: 2013-05-19 | Discharge: 2013-05-19 | Disposition: A | Discharge status: Home / Self Care | Payer: Medicare Other | Source: Ambulatory Visit | Attending: Family | Admitting: Family

## 2013-05-19 ENCOUNTER — Ambulatory Visit (INDEPENDENT_AMBULATORY_CARE_PROVIDER_SITE_OTHER): Payer: Medicare Other | Admitting: Family

## 2013-05-19 ENCOUNTER — Ambulatory Visit: Payer: Medicare Other | Admitting: Family

## 2013-05-19 ENCOUNTER — Other Ambulatory Visit (HOSPITAL_COMMUNITY): Payer: Medicare Other

## 2013-05-19 ENCOUNTER — Encounter (INDEPENDENT_AMBULATORY_CARE_PROVIDER_SITE_OTHER): Payer: Self-pay

## 2013-05-19 VITALS — BP 132/78 | HR 56 | Resp 14 | Ht 70.5 in | Wt 193.0 lb

## 2013-05-19 DIAGNOSIS — Z48812 Encounter for surgical aftercare following surgery on the circulatory system: Secondary | ICD-10-CM | POA: Insufficient documentation

## 2013-05-19 DIAGNOSIS — I714 Abdominal aortic aneurysm, without rupture: Secondary | ICD-10-CM

## 2013-05-19 DIAGNOSIS — I719 Aortic aneurysm of unspecified site, without rupture: Secondary | ICD-10-CM | POA: Insufficient documentation

## 2013-05-19 NOTE — Progress Notes (Signed)
[facility] [place]  Established [facility]  History of Present Illness  Vincent Velez is a 72 y.o. (01/15/1941) male  patient of Dr. 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  The patient has not had back or abdominal pain. He denies claudication symptoms, denies non-healing wounds. He denies history of stroke or TIA symptoms. States he is physically active.  Pt Diabetic: Yes, states in good control Pt smoker: smoker  (smokes a pipe occasionally, never smoked cigarettes)   Past Medical History  Diagnosis Date  . Allergy, unspecified not elsewhere classified   . Arthropathy, unspecified, site unspecified   . Malignant neoplasm of prostate   . Family history of malignant neoplasm of gastrointestinal tract   . Esophageal reflux   . Stricture and stenosis of esophagus   . Hiatal hernia   . Hypertension   . Hyperlipemia   . Diabetes mellitus   . COPD (chronic obstructive pulmonary disease)   . CAD (coronary artery disease)   . Peripheral vascular disease    Past Surgical History  Procedure Laterality Date  . Insertion prostate radiation seed    . Abdominal aorta stent     Social History History  Substance Use Topics  . Smoking status: Never Smoker   . Smokeless tobacco: Never Used  . Alcohol Use: No   Family History Family History  Problem Relation Age of Onset  . Colon cancer Father   . Cancer Father     ? Colon  . Diabetes Mother   . Heart disease Mother     Heart Disease before age 64  . Deep vein thrombosis Mother   . Hyperlipidemia Mother   . Hypertension Mother   . Varicose Veins Mother   . Cancer Brother     Prostate  . Hypertension Brother   . Heart attack Brother    Current Outpatient Prescriptions on File Prior to Visit  Medication Sig Dispense Refill  . aspirin 81 MG tablet Take 81 mg by mouth daily.        . brinzolamide (AZOPT) 1 % ophthalmic suspension Place 1 drop into both  eyes 3 (three) times daily.        Marland Kitchen glimepiride (AMARYL) 1 MG tablet Take 1 mg by mouth daily before breakfast.        . lisinopril (PRINIVIL,ZESTRIL) 5 MG tablet Take 5 mg by mouth daily.        . Multiple Vitamin (MULTIVITAMIN) capsule Take 1 capsule by mouth daily.        . Omega-3 Fatty Acids (FISH OIL) 1000 MG CAPS Take 1 capsule by mouth daily.        . simvastatin (ZOCOR) 40 MG tablet Take 40 mg by mouth at bedtime.        Marland Kitchen esomeprazole (NEXIUM) 40 MG capsule Take 1 capsule (40 mg total) by mouth daily.  90 capsule  3   No current facility-administered medications on file prior to visit.   No Known Allergies   ROS: See HPI for pertinent positives and negatives.  Physical Examination  Filed Vitals:   05/19/13 1046  BP: 132/78  Pulse: 56  Resp: 14  Height: 5' 10.5" (1.791 m)  Weight: 193 lb (87.544 kg)  SpO2: 98%   Body mass index is 27.29 kg/(m^2).  General: A&O x 3, WD.  Pulmonary: Sym exp, good air movt, CTAB, no rales, rhonchi, & wheezing.  Cardiac: RRR, Nl S1, S2, no Murmurs  detected.  Vascular: Vessel Right Left  Radial 2+Palpable 2+Palpable  Carotid  without bruit  without bruit  Aorta Not palpable N/A  Femoral Palpable Palpable  Popliteal Not palpable Not palpable  PT Palpable Palpable  DP Palpable Palpable   Gastrointestinal: soft, NTND, -G/R, - HSM, - masses, - CVAT B.  Musculoskeletal: M/S 5/5 throughout, Extremities without ischemic changes.  Neurologic: Pain and light touch intact in extremities, Motor exam as listed above.  Non-Invasive Vascular Imaging  EVAR Duplex (Date: 05/19/2013)  AAA sac size: 4.13 cm x 4.10 cm  no endoleak detected  EVAR Duplex (Date: 05/10/12)  AAA sac size: 4.16 cm   no endoleak detected  Medical Decision Making  Vincent Velez is a 72 y.o. male who presents s/p EVAR November 2010.  Pt is asymptomatic with no increase in sac size.  I discussed with the patient the importance of surveillance of the  endograft.  The next endograft duplex will be scheduled for 12 months.  The patient will follow up with [place] in 12 months with these studies.  I emphasized the importance of maximal medical management including strict control of blood pressure, blood glucose, and lipid levels, antiplatelet agents, obtaining regular exercise, and cessation of smoking.    The patient was given information about AAA including signs, symptoms, treatment, and how to minimize the risk of enlargement and rupture of aneurysms.    Thank you for allowing [place] to participate in this patient's care.  Charisse March, RN, MSN, FNP-C [facility] [place] Office: [phone]  Clinic Physician: Imogene Burn  05/19/2013, 11:01 AM

## 2013-05-19 NOTE — Patient Instructions (Signed)

## 2013-08-03 ENCOUNTER — Encounter: Payer: Self-pay | Admitting: Cardiology

## 2013-09-07 ENCOUNTER — Ambulatory Visit: Payer: Medicare Other | Admitting: Cardiology

## 2013-09-15 ENCOUNTER — Encounter: Payer: Self-pay | Admitting: Cardiology

## 2013-09-15 ENCOUNTER — Ambulatory Visit (INDEPENDENT_AMBULATORY_CARE_PROVIDER_SITE_OTHER): Payer: Medicare Other | Admitting: Cardiology

## 2013-09-15 VITALS — BP 132/78 | HR 64 | Ht 71.0 in | Wt 190.0 lb

## 2013-09-15 DIAGNOSIS — I498 Other specified cardiac arrhythmias: Secondary | ICD-10-CM | POA: Diagnosis not present

## 2013-09-15 DIAGNOSIS — Z9889 Other specified postprocedural states: Secondary | ICD-10-CM

## 2013-09-15 DIAGNOSIS — Z8679 Personal history of other diseases of the circulatory system: Secondary | ICD-10-CM

## 2013-09-15 DIAGNOSIS — I359 Nonrheumatic aortic valve disorder, unspecified: Secondary | ICD-10-CM | POA: Diagnosis not present

## 2013-09-15 DIAGNOSIS — R001 Bradycardia, unspecified: Secondary | ICD-10-CM

## 2013-09-15 DIAGNOSIS — I350 Nonrheumatic aortic (valve) stenosis: Secondary | ICD-10-CM

## 2013-09-15 DIAGNOSIS — E7800 Pure hypercholesterolemia, unspecified: Secondary | ICD-10-CM | POA: Diagnosis not present

## 2013-09-15 LAB — LIPID PANEL
CHOL/HDL RATIO: 3
CHOLESTEROL: 120 mg/dL (ref 0–200)
HDL: 37.7 mg/dL — ABNORMAL LOW (ref >39.00)
LDL CALC: 60 mg/dL (ref 0–99)
Triglycerides: 110 mg/dL (ref 0.0–149.0)
VLDL: 22 mg/dL (ref 0.0–40.0)

## 2013-09-15 NOTE — Progress Notes (Signed)
[facility]. [address] [place], [place]  [address] Phone: [phone] Fax:  [phone]  Date:  09/15/2013   ID:  Vincent Velez, DOB 1940/10/12, MRN [id number]  PCP:  Kandice Hams, MD   History of Present Illness: Vincent Velez is a 73 y.o. male with aortic stenosis, for followup after stopping beta blocker because of bradycardia. He is currently feeling more energy with increased heart rate. No syncope, no dizziness. He is doing well. His blood pressure also remains under good control which is very important with AAA.  Stress test was performed in November 2010 showing a small area of possible ischemia in the apical lateral segment. We agreed on medical management and optimizing his risk factor modification. His LDLis excellent. He also has a history of stent graft to his abdominal aorta 2011 for which Dr. Oneida Alar follows. He has not been having any abdominal pain.   He has been compliant with his statin medications.   Occasionally he will have left arm pain which he thinks is musculoskeletal from his shoulder. He wants to get this looked at some point. He will have occasional fleeting chest pain, thought that it was from his eating of fajitas at one point.   Wt Readings from Last 3 Encounters:  09/15/13 190 lb (86.183 kg)  05/19/13 193 lb (87.544 kg)  05/10/12 200 lb (90.719 kg)     Past Medical History  Diagnosis Date  . Allergy, unspecified not elsewhere classified   . Arthropathy, unspecified, site unspecified   . Malignant neoplasm of prostate   . Family history of malignant neoplasm of gastrointestinal tract   . Esophageal reflux   . Stricture and stenosis of esophagus   . Hiatal hernia   . Hypertension   . Hyperlipemia   . Diabetes mellitus   . COPD (chronic obstructive pulmonary disease)   . CAD (coronary artery disease)   . Peripheral vascular disease     Past Surgical History  Procedure Laterality Date  . Insertion prostate radiation seed     . Abdominal aorta stent      Current Outpatient Prescriptions  Medication Sig Dispense Refill  . aspirin 81 MG tablet Take 81 mg by mouth daily.        . brinzolamide (AZOPT) 1 % ophthalmic suspension Place 1 drop into both eyes 3 (three) times daily.        . Fluticasone-Salmeterol (ADVAIR DISKUS) 100-50 MCG/DOSE AEPB Inhale 1 puff into the lungs 2 (two) times daily.      Marland Kitchen glimepiride (AMARYL) 1 MG tablet Take 1 mg by mouth daily before breakfast.        . lisinopril (PRINIVIL,ZESTRIL) 5 MG tablet Take 5 mg by mouth daily.        . Multiple Vitamin (MULTIVITAMIN) capsule Take 1 capsule by mouth daily.        . Omega-3 Fatty Acids (FISH OIL) 1000 MG CAPS Take 1 capsule by mouth daily.        . simvastatin (ZOCOR) 40 MG tablet Take 40 mg by mouth at bedtime.        Marland Kitchen esomeprazole (NEXIUM) 40 MG capsule Take 1 capsule (40 mg total) by mouth daily.  90 capsule  3   No current facility-administered medications for this visit.    Allergies:   No Known Allergies  Social History:  The patient  reports that he has never smoked. He has never used smokeless tobacco. He reports that he does  not drink alcohol or use illicit drugs.   ROS:  Please see the history of present illness.   Denies syncope, bleeding, or anemia  PHYSICAL EXAM: VS:  BP 132/78  Pulse 64  Ht 5\' 11"  (1.803 m)  Wt 190 lb (86.183 kg)  BMI 26.51 kg/m2 Well nourished, well developed, in no acute distress HEENT: normal Neck: no JVD Cardiac:  normal S1, S2; RRR; 2/6 SEM murmur Lungs:  clear to auscultation bilaterally, no wheezing, rhonchi or rales Abd: soft, nontender, no hepatomegaly Ext: no edema Skin: warm and dry Neuro: no focal abnormalities noted  EKG:  09/15/13-Sinus rhythm, PVCs  ECHO 08/23/12 -  1. Normal LV size and function. 2. Left ventricular ejection fraction estimated by 2D at 60-65 percent. 3. There were no regional wall motion abnormalities. 4. Trace mitral valve regurgitation. 5. Trivial tricuspid  regurgitation. 6. Moderate calcification of the aortic valve. 7. Moderate aortic valve stenosis. Peak velocity 2.61m/s, mean gradient 74mmHg. Advanced from mild 2010. 8. Mild aortic valve regurgitation. 9. There is mild aortic root dilatation. 4.1cm ascending root. 10. Analysis of mitral valve inflow, pulmonary vein Doppler and tissue Doppler suggests grade I diastolic dysfunction without elevated left atrial pressure.  ASSESSMENT AND PLAN:  1. Bradycardia-resolved after discontinuation of beta blocker. 2. Aortic stenosis-moderate severity. Repeat echocardiogram. Has mild shortness of breath but no significant symptoms. 3. S/p AAA - Dr. Oneida Alar. Doing well. No complaints. 4. Hyperlipidemia-on statin. I will check a lipid profile. Is been since 2013. 5. Tobacco use-encourage tobacco cessation counseling. Cigars, pipe.  Signed, Candee Furbish, MD [facility]  09/15/2013 1:55 PM

## 2013-09-15 NOTE — Patient Instructions (Signed)
Your physician recommends that you continue on your current medications as directed. Please refer to the Current Medication list given to you today.  Your physician recommends that you have labs today: Lipid  Your physician has requested that you have an echocardiogram. Echocardiography is a painless test that uses sound waves to create images of your heart. It provides your doctor with information about the size and shape of your heart and how well your heart's chambers and valves are working. This procedure takes approximately one hour. There are no restrictions for this procedure.  Your physician wants you to follow-up in: 1 year with Dr. Dawna Part will receive a reminder letter in the mail two months in advance. If you don't receive a letter, please call our office to schedule the follow-up appointment.

## 2013-09-22 DIAGNOSIS — E785 Hyperlipidemia, unspecified: Secondary | ICD-10-CM | POA: Diagnosis not present

## 2013-09-22 DIAGNOSIS — F17200 Nicotine dependence, unspecified, uncomplicated: Secondary | ICD-10-CM | POA: Diagnosis not present

## 2013-09-22 DIAGNOSIS — I10 Essential (primary) hypertension: Secondary | ICD-10-CM | POA: Diagnosis not present

## 2013-09-22 DIAGNOSIS — E119 Type 2 diabetes mellitus without complications: Secondary | ICD-10-CM | POA: Diagnosis not present

## 2013-09-26 ENCOUNTER — Ambulatory Visit (HOSPITAL_COMMUNITY): Payer: Medicare Other | Attending: Cardiovascular Disease | Admitting: Cardiology

## 2013-09-26 ENCOUNTER — Encounter: Payer: Self-pay | Admitting: Cardiovascular Disease

## 2013-09-26 DIAGNOSIS — Z8679 Personal history of other diseases of the circulatory system: Secondary | ICD-10-CM

## 2013-09-26 DIAGNOSIS — I359 Nonrheumatic aortic valve disorder, unspecified: Secondary | ICD-10-CM

## 2013-09-26 DIAGNOSIS — Z9889 Other specified postprocedural states: Secondary | ICD-10-CM

## 2013-09-26 DIAGNOSIS — I350 Nonrheumatic aortic (valve) stenosis: Secondary | ICD-10-CM

## 2013-09-26 DIAGNOSIS — R001 Bradycardia, unspecified: Secondary | ICD-10-CM

## 2013-09-26 NOTE — Progress Notes (Signed)
Echo performed. 

## 2013-10-03 DIAGNOSIS — H409 Unspecified glaucoma: Secondary | ICD-10-CM | POA: Diagnosis not present

## 2013-10-03 DIAGNOSIS — H4011X0 Primary open-angle glaucoma, stage unspecified: Secondary | ICD-10-CM | POA: Diagnosis not present

## 2013-10-03 DIAGNOSIS — Z961 Presence of intraocular lens: Secondary | ICD-10-CM | POA: Diagnosis not present

## 2013-10-03 DIAGNOSIS — E119 Type 2 diabetes mellitus without complications: Secondary | ICD-10-CM | POA: Diagnosis not present

## 2013-10-11 ENCOUNTER — Other Ambulatory Visit: Payer: Self-pay

## 2013-10-11 MED ORDER — LISINOPRIL 5 MG PO TABS: 5.0000 mg | ORAL_TABLET | Freq: Every day | ORAL | Status: DC

## 2013-11-06 ENCOUNTER — Ambulatory Visit: Payer: Self-pay | Admitting: Podiatry

## 2013-11-13 ENCOUNTER — Ambulatory Visit (INDEPENDENT_AMBULATORY_CARE_PROVIDER_SITE_OTHER): Payer: Medicare Other | Admitting: Podiatry

## 2013-11-13 ENCOUNTER — Ambulatory Visit (INDEPENDENT_AMBULATORY_CARE_PROVIDER_SITE_OTHER): Payer: Medicare Other

## 2013-11-13 ENCOUNTER — Encounter: Payer: Self-pay | Admitting: Podiatry

## 2013-11-13 VITALS — BP 148/68 | HR 69 | Resp 12

## 2013-11-13 DIAGNOSIS — M722 Plantar fascial fibromatosis: Secondary | ICD-10-CM

## 2013-11-13 DIAGNOSIS — R52 Pain, unspecified: Secondary | ICD-10-CM

## 2013-11-13 MED ORDER — TRIAMCINOLONE ACETONIDE 10 MG/ML IJ SUSP
10.0000 mg | Freq: Once | INTRAMUSCULAR | Status: AC
  Administered 2013-11-13: 10 mg

## 2013-11-13 NOTE — Progress Notes (Signed)
Subjective:     Patient ID: Vincent Velez, male   DOB: August 10, 1940, 73 y.o.   MRN: [id number]  Foot Pain   patient presents stating that my arch is feeling they have fallen and that I am aggravated when I try to walk and the right arch is starting to hurt   Review of Systems  All other systems reviewed and are negative.      Objective:   Physical Exam  Nursing note and vitals reviewed. Constitutional: He is oriented to person, place, and time.  Cardiovascular: Intact distal pulses.   Musculoskeletal: Normal range of motion.  Neurological: He is oriented to person, place, and time.  Skin: Skin is warm.   neurovascular status is intact with range of motion of the subtalar midtarsal joint adequate and mild equinus condition noted. 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 Injected the right fascia distal to the insertion with 3 mg Kenalog 5 of Xylocaine Marcaine mixture advised him reduced activity and dispensed fascially brace for usage. Gave instructions on orthotics and scanned for custom orthotics at this time

## 2013-11-13 NOTE — Progress Notes (Signed)
   Subjective:    Patient ID: Vincent Velez, male    DOB: 1941-05-02, 73 y.o.   MRN: [id number]  HPI  PT STATE HAVE FALLEN ARCHES AND BOTH FEET START HURTING FOR 6 MONTHS. THE ARCHES ARE GETTING WORSE. THE FEET GET AGGRAVATED BY WALKING AND TRIED NO TREATMENT.    Review of Systems  Musculoskeletal: Positive for gait problem.  Allergic/Immunologic: Positive for environmental allergies.  All other systems reviewed and are negative.      Objective:   Physical Exam        Assessment & Plan:

## 2013-11-24 ENCOUNTER — Telehealth: Payer: Self-pay

## 2013-11-24 NOTE — Telephone Encounter (Signed)
Pt refill of Lisinopril sent to [facility]. Called pt to confirm pharmacy and he stated he [facility] and that is the [facility] not [facility]

## 2013-11-27 ENCOUNTER — Other Ambulatory Visit: Payer: Self-pay

## 2013-11-27 MED ORDER — LISINOPRIL 5 MG PO TABS: 5.0000 mg | ORAL_TABLET | Freq: Every day | ORAL | Status: DC

## 2013-12-11 ENCOUNTER — Ambulatory Visit: Payer: Medicare Other | Admitting: *Deleted

## 2013-12-11 DIAGNOSIS — M722 Plantar fascial fibromatosis: Secondary | ICD-10-CM

## 2013-12-11 NOTE — Progress Notes (Signed)
Pt is here to [facility] 

## 2013-12-11 NOTE — Patient Instructions (Signed)

## 2014-01-27 ENCOUNTER — Encounter: Payer: Self-pay | Admitting: Cardiology

## 2014-03-19 DIAGNOSIS — I10 Essential (primary) hypertension: Secondary | ICD-10-CM | POA: Diagnosis not present

## 2014-03-19 DIAGNOSIS — I714 Abdominal aortic aneurysm, without rupture: Secondary | ICD-10-CM | POA: Diagnosis not present

## 2014-03-19 DIAGNOSIS — Z1389 Encounter for screening for other disorder: Secondary | ICD-10-CM | POA: Diagnosis not present

## 2014-03-19 DIAGNOSIS — Z23 Encounter for immunization: Secondary | ICD-10-CM | POA: Diagnosis not present

## 2014-03-19 DIAGNOSIS — F419 Anxiety disorder, unspecified: Secondary | ICD-10-CM | POA: Diagnosis not present

## 2014-03-19 DIAGNOSIS — E784 Other hyperlipidemia: Secondary | ICD-10-CM | POA: Diagnosis not present

## 2014-03-19 DIAGNOSIS — Z0001 Encounter for general adult medical examination with abnormal findings: Secondary | ICD-10-CM | POA: Diagnosis not present

## 2014-03-19 DIAGNOSIS — Z8546 Personal history of malignant neoplasm of prostate: Secondary | ICD-10-CM | POA: Diagnosis not present

## 2014-03-19 DIAGNOSIS — I359 Nonrheumatic aortic valve disorder, unspecified: Secondary | ICD-10-CM | POA: Diagnosis not present

## 2014-04-05 DIAGNOSIS — H4011X1 Primary open-angle glaucoma, mild stage: Secondary | ICD-10-CM | POA: Diagnosis not present

## 2014-05-21 ENCOUNTER — Other Ambulatory Visit (HOSPITAL_COMMUNITY): Payer: Medicare Other

## 2014-05-21 ENCOUNTER — Ambulatory Visit: Payer: Medicare Other | Admitting: Family

## 2014-06-07 ENCOUNTER — Encounter: Payer: Self-pay | Admitting: Family

## 2014-06-08 ENCOUNTER — Encounter: Payer: Self-pay | Admitting: Family

## 2014-06-08 ENCOUNTER — Ambulatory Visit (HOSPITAL_COMMUNITY)
Admission: RE | Admit: 2014-06-08 | Discharge: 2014-06-08 | Disposition: A | Discharge status: Home / Self Care | Payer: Medicare Other | Source: Ambulatory Visit | Attending: Family | Admitting: Family

## 2014-06-08 ENCOUNTER — Ambulatory Visit (INDEPENDENT_AMBULATORY_CARE_PROVIDER_SITE_OTHER): Payer: Medicare Other | Admitting: Family

## 2014-06-08 VITALS — BP 123/80 | HR 53 | Resp 18 | Ht 70.0 in | Wt 191.0 lb

## 2014-06-08 DIAGNOSIS — Z48812 Encounter for surgical aftercare following surgery on the circulatory system: Secondary | ICD-10-CM | POA: Insufficient documentation

## 2014-06-08 DIAGNOSIS — I714 Abdominal aortic aneurysm, without rupture, unspecified: Secondary | ICD-10-CM

## 2014-06-08 DIAGNOSIS — Z95828 Presence of other vascular implants and grafts: Secondary | ICD-10-CM

## 2014-06-08 NOTE — Patient Instructions (Signed)
Abdominal Aortic Aneurysm An aneurysm is a weakened or damaged part of an artery wall that bulges from the normal force of blood pumping through the body. An abdominal aortic aneurysm is an aneurysm that occurs in the lower part of the aorta, the main artery of the body.  The major concern with an abdominal aortic aneurysm is that it can enlarge and burst (rupture) or blood can flow between the layers of the wall of the aorta through a tear (aorticdissection). Both of these conditions can cause bleeding inside the body and can be life threatening unless diagnosed and treated promptly. CAUSES  The exact cause of an abdominal aortic aneurysm is unknown. Some contributing factors are:   A hardening of the arteries caused by the buildup of fat and other substances in the lining of a blood vessel (arteriosclerosis).  Inflammation of the walls of an artery (arteritis).   Connective tissue diseases, such as Marfan syndrome.   Abdominal trauma.   An infection, such as syphilis or staphylococcus, in the wall of the aorta (infectious aortitis) caused by bacteria. RISK FACTORS  Risk factors that contribute to an abdominal aortic aneurysm may include:  Age older than 60 years.   High blood pressure (hypertension).  Male gender.  Ethnicity (white race).  Obesity.  Family history of aneurysm (first degree relatives only).  Tobacco use. PREVENTION  The following healthy lifestyle habits may help decrease your risk of abdominal aortic aneurysm:  Quitting smoking. Smoking can raise your blood pressure and cause arteriosclerosis.  Limiting or avoiding alcohol.  Keeping your blood pressure, blood sugar level, and cholesterol levels within normal limits.  Decreasing your salt intake. In somepeople, too much salt can raise blood pressure and increase your risk of abdominal aortic aneurysm.  Eating a diet low in saturated fats and cholesterol.  Increasing your fiber intake by including  whole grains, vegetables, and fruits in your diet. Eating these foods may help lower blood pressure.  Maintaining a healthy weight.  Staying physically active and exercising regularly. SYMPTOMS  The symptoms of abdominal aortic aneurysm may vary depending on the size and rate of growth of the aneurysm.Most grow slowly and do not have any symptoms. When symptoms do occur, they may include:  Pain (abdomen, side, lower back, or groin). The pain may vary in intensity. A sudden onset of severe pain may indicate that the aneurysm has ruptured.  Feeling full after eating only small amounts of food.  Nausea or vomiting or both.  Feeling a pulsating lump in the abdomen.  Feeling faint or passing out. DIAGNOSIS  Since most unruptured abdominal aortic aneurysms have no symptoms, they are often discovered during diagnostic exams for other conditions. An aneurysm may be found during the following procedures:  Ultrasonography (A one-time screening for abdominal aortic aneurysm by ultrasonography is also recommended for all men aged 65-75 years who have ever smoked).  X-ray exams.  A computed tomography (CT).  Magnetic resonance imaging (MRI).  Angiography or arteriography. TREATMENT  Treatment of an abdominal aortic aneurysm depends on the size of your aneurysm, your age, and risk factors for rupture. Medication to control blood pressure and pain may be used to manage aneurysms smaller than 6 cm. Regular monitoring for enlargement may be recommended by your caregiver if:  The aneurysm is 3-4 cm in size (an annual ultrasonography may be recommended).  The aneurysm is 4-4.5 cm in size (an ultrasonography every 6 months may be recommended).  The aneurysm is larger than 4.5 cm in   size (your caregiver may ask that you be examined by a vascular surgeon). If your aneurysm is larger than 6 cm, surgical repair may be recommended. There are two main methods for repair of an aneurysm:   Endovascular  repair (a minimally invasive surgery). This is done most often.  Open repair. This method is used if an endovascular repair is not possible. Document Released: 02/25/2005 Document Revised: 09/12/2012 Document Reviewed: 06/17/2012 [employer] Patient Information 2015 [employer]. This information is not intended to replace advice given to you by your health care provider. Make sure you discuss any questions you have with your health care provider.  

## 2014-06-08 NOTE — Progress Notes (Signed)
[facility] [place]  Established s/p EVAR  History of Present Illness  Vincent Velez is a 74 y.o. ([birth year]) male patient of Dr. 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  The patient has not had back or abdominal pain. He denies claudication symptoms, denies non-healing wounds. He denies history of stroke or TIA symptoms. States he is less physically active lately due to feet arch issue. His son died December 16, 2013 in a motorcycle accident, he is grieving this loss.  Pt Diabetic: Yes, states lately not in as good control with the recent Holidays Pt smoker: smoker (smokes a pipe occasionally, never smoked cigarettes)  Past Medical History  Diagnosis Date  . Allergy, unspecified not elsewhere classified   . Arthropathy, unspecified, site unspecified   . Malignant neoplasm of prostate   . Family history of malignant neoplasm of gastrointestinal tract   . Esophageal reflux   . Stricture and stenosis of esophagus   . Hiatal hernia   . Hypertension   . Hyperlipemia   . Diabetes mellitus   . COPD (chronic obstructive pulmonary disease)   . CAD (coronary artery disease)   . Peripheral vascular disease    Past Surgical History  Procedure Laterality Date  . Insertion prostate radiation seed    . Abdominal aorta stent     Social History History   Social History  . Marital Status: Married    Spouse Name: N/A    Number of Children: 2  . Years of Education: N/A   Occupational History  . engineer [employer]   Social History Main Topics  . Smoking status: Never Smoker   . Smokeless tobacco: Never Used  . Alcohol Use: No  . Drug Use: No  . Sexual Activity: Not on file   Other Topics Concern  . Not on file   Social History Narrative   Family History Family History  Problem Relation Age of Onset  . Colon cancer Father   . Cancer Father     ? Colon  . Diabetes Mother   . Heart disease Mother      Heart Disease before age 34  . Deep vein thrombosis Mother   . Hyperlipidemia Mother   . Hypertension Mother   . Varicose Veins Mother   . Cancer Brother     Prostate  . Hypertension Brother   . Heart attack Brother     Current Outpatient Prescriptions on File Prior to Visit  Medication Sig Dispense Refill  . aspirin 81 MG tablet Take 81 mg by mouth daily.      . brinzolamide (AZOPT) 1 % ophthalmic suspension Place 1 drop into both eyes 3 (three) times daily.      . Fluticasone-Salmeterol (ADVAIR DISKUS) 100-50 MCG/DOSE AEPB Inhale 1 puff into the lungs 2 (two) times daily.    Marland Kitchen glimepiride (AMARYL) 1 MG tablet Take 1 mg by mouth daily before breakfast.      . lisinopril (PRINIVIL,ZESTRIL) 5 MG tablet Take 1 tablet (5 mg total) by mouth daily. 90 tablet 2  . Multiple Vitamin (MULTIVITAMIN) capsule Take 1 capsule by mouth daily.      . Omega-3 Fatty Acids (FISH OIL) 1000 MG CAPS Take 1 capsule by mouth daily.      . simvastatin (ZOCOR) 40 MG tablet Take 40 mg by mouth at bedtime.      Marland Kitchen esomeprazole (NEXIUM) 40 MG capsule Take 1 capsule (40 mg total) by mouth daily.  90 capsule 3   No current facility-administered medications on file prior to visit.   No Known Allergies  ROS: See HPI for pertinent positives and negatives.  Physical Examination  Filed Vitals:   06/08/14 0938  BP: 123/80  Pulse: 53  Resp: 18  Height: 5\' 10"  (1.778 m)  Weight: 191 lb (86.637 kg)   Body mass index is 27.41 kg/(m^2).  General: A&O x 3, WD.  Pulmonary: Sym exp, good air movt, CTAB, no rales, rhonchi, & wheezing.  Cardiac: RRR, Nl S1, S2, no Murmurs detected.  Vascular: Vessel Right Left  Radial 2+Palpable 2+Palpable  Carotid without bruit without bruit  Aorta Not palpable N/A  Femoral Palpable Palpable  Popliteal Not palpable Not palpable  PT Palpable Palpable  DP Palpable Palpable   Gastrointestinal: soft, NTND, -G/R, - HSM, - palpable masses, - CVAT  B.  Musculoskeletal: M/S 5/5 throughout, Extremities without ischemic changes.  Neurologic: Pain and light touch intact in extremities, Motor exam as listed above.    Non-Invasive Vascular Imaging  EVAR Duplex (06/08/2014) ABDOMINAL AORTA DUPLEX EVALUATION - POST ENDOVASCULAR REPAIR    INDICATION: Evaluation of endovascular abdominal repair of aortic aneurysm.    PREVIOUS INTERVENTION(S): EVAR 04/11/2009.    DUPLEX EXAM:      DIAMETER AP (cm) DIAMETER TRANSVERSE (cm) VELOCITIES (cm/sec)  Aorta 4.46 4.29 99  Right Common Iliac 1.25 1.25 80  Left Common Iliac 1.28  79    Comparison Study       Date DIAMETER AP (cm) DIAMETER TRANSVERSE (cm)  05/19/2013 4.13 4.10     ADDITIONAL FINDINGS:     IMPRESSION: Patent endovascular abdominal aortic repair with no a maximum diameter of 4.46 x 4.49 cm.    Compared to the previous exam:  Diameter increase since the last exam; however, on that exam, the patient was not NPO.    Medical Decision Making  The patient is a 73 y.o. male who presents s/p EVAR November 2010. Pt is asymptomatic with an increase in diameter in a year's time (4.13 to 4.46 cm), patent EVAR however.  I discussed with the patient the importance of surveillance of the endograft.  I discussed Duplex results with Dr. Bridgett Larsson.  Last CTA abdomen was in 2011, results as follows: CTA ABDOMEN Findings: The patient is status post stent graft repair of the abdominal aortic aneurysm. Aneurysm sac size is currently 4.8 x 3.3 cm compared to 5.0 x 3.5 cm previously. No evidence of endoleak. 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 Kidneys enhance symmetrically and normally. No hydronephrosis.  Liver, spleen, gallbladder, pancreas and right adrenal are unremarkable. 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 Bowel grossly unremarkable. No free fluid, free air,  or adenopathy.  Review of the MIP images confirms the above findings.  IMPRESSION: Slight interval decrease in size of the aneurysm sac post stent graft repair of the AAA. Maximum diameter currently 4.8 cm. No evidence of endoleak.  Left adrenal adenoma, stable.   The next endograft duplex will be scheduled for 3 months.  The patient will follow up with [place] in 3 months with these studies.  I emphasized the importance of maximal medical management including strict control of blood pressure, blood glucose, and lipid levels, antiplatelet agents, obtaining regular exercise, and cessation of smoking. Face to face time with patient was 25 minutes. Over 50% of this time was spent on counseling and coordination of care.   The patient was given information  about AAA including signs, symptoms, treatment, and how to minimize the risk of enlargement and rupture of aneurysms.  Thank you for allowing [place] to participate in this patient's care.   Clemon Chambers, RN, MSN, FNP-C [facility] [place] Office: [phone]  Clinic Physician: Bridgett Larsson  06/08/2014, 10:00 AM

## 2014-07-10 DIAGNOSIS — H4011X1 Primary open-angle glaucoma, mild stage: Secondary | ICD-10-CM | POA: Diagnosis not present

## 2014-08-29 ENCOUNTER — Encounter: Payer: Self-pay | Admitting: Family

## 2014-08-29 ENCOUNTER — Other Ambulatory Visit: Payer: Self-pay | Admitting: *Deleted

## 2014-08-29 DIAGNOSIS — Z48812 Encounter for surgical aftercare following surgery on the circulatory system: Secondary | ICD-10-CM

## 2014-08-29 DIAGNOSIS — I714 Abdominal aortic aneurysm, without rupture, unspecified: Secondary | ICD-10-CM

## 2014-08-30 ENCOUNTER — Ambulatory Visit (INDEPENDENT_AMBULATORY_CARE_PROVIDER_SITE_OTHER): Payer: Medicare Other | Admitting: Family

## 2014-08-30 ENCOUNTER — Encounter: Payer: Self-pay | Admitting: Family

## 2014-08-30 ENCOUNTER — Other Ambulatory Visit: Payer: Self-pay | Admitting: Vascular Surgery

## 2014-08-30 ENCOUNTER — Ambulatory Visit (HOSPITAL_COMMUNITY)
Admission: RE | Admit: 2014-08-30 | Discharge: 2014-08-30 | Disposition: A | Discharge status: Home / Self Care | Payer: Medicare Other | Source: Ambulatory Visit | Attending: Family | Admitting: Family

## 2014-08-30 VITALS — BP 124/79 | HR 50 | Resp 14 | Ht 69.0 in | Wt 192.0 lb

## 2014-08-30 DIAGNOSIS — Z87891 Personal history of nicotine dependence: Secondary | ICD-10-CM | POA: Diagnosis not present

## 2014-08-30 DIAGNOSIS — Z95828 Presence of other vascular implants and grafts: Secondary | ICD-10-CM

## 2014-08-30 DIAGNOSIS — Z48812 Encounter for surgical aftercare following surgery on the circulatory system: Secondary | ICD-10-CM

## 2014-08-30 DIAGNOSIS — R0989 Other specified symptoms and signs involving the circulatory and respiratory systems: Secondary | ICD-10-CM | POA: Insufficient documentation

## 2014-08-30 DIAGNOSIS — I714 Abdominal aortic aneurysm, without rupture, unspecified: Secondary | ICD-10-CM

## 2014-08-30 NOTE — Patient Instructions (Signed)
Abdominal Aortic Aneurysm An aneurysm is a weakened or damaged part of an artery wall that bulges from the normal force of blood pumping through the body. An abdominal aortic aneurysm is an aneurysm that occurs in the lower part of the aorta, the main artery of the body.  The major concern with an abdominal aortic aneurysm is that it can enlarge and burst (rupture) or blood can flow between the layers of the wall of the aorta through a tear (aorticdissection). Both of these conditions can cause bleeding inside the body and can be life threatening unless diagnosed and treated promptly. CAUSES  The exact cause of an abdominal aortic aneurysm is unknown. Some contributing factors are:   A hardening of the arteries caused by the buildup of fat and other substances in the lining of a blood vessel (arteriosclerosis).  Inflammation of the walls of an artery (arteritis).   Connective tissue diseases, such as Marfan syndrome.   Abdominal trauma.   An infection, such as syphilis or staphylococcus, in the wall of the aorta (infectious aortitis) caused by bacteria. RISK FACTORS  Risk factors that contribute to an abdominal aortic aneurysm may include:  Age older than 60 years.   High blood pressure (hypertension).  Male gender.  Ethnicity (white race).  Obesity.  Family history of aneurysm (first degree relatives only).  Tobacco use. PREVENTION  The following healthy lifestyle habits may help decrease your risk of abdominal aortic aneurysm:  Quitting smoking. Smoking can raise your blood pressure and cause arteriosclerosis.  Limiting or avoiding alcohol.  Keeping your blood pressure, blood sugar level, and cholesterol levels within normal limits.  Decreasing your salt intake. In somepeople, too much salt can raise blood pressure and increase your risk of abdominal aortic aneurysm.  Eating a diet low in saturated fats and cholesterol.  Increasing your fiber intake by including  whole grains, vegetables, and fruits in your diet. Eating these foods may help lower blood pressure.  Maintaining a healthy weight.  Staying physically active and exercising regularly. SYMPTOMS  The symptoms of abdominal aortic aneurysm may vary depending on the size and rate of growth of the aneurysm.Most grow slowly and do not have any symptoms. When symptoms do occur, they may include:  Pain (abdomen, side, lower back, or groin). The pain may vary in intensity. A sudden onset of severe pain may indicate that the aneurysm has ruptured.  Feeling full after eating only small amounts of food.  Nausea or vomiting or both.  Feeling a pulsating lump in the abdomen.  Feeling faint or passing out. DIAGNOSIS  Since most unruptured abdominal aortic aneurysms have no symptoms, they are often discovered during diagnostic exams for other conditions. An aneurysm may be found during the following procedures:  Ultrasonography (A one-time screening for abdominal aortic aneurysm by ultrasonography is also recommended for all men aged 65-75 years who have ever smoked).  X-ray exams.  A computed tomography (CT).  Magnetic resonance imaging (MRI).  Angiography or arteriography. TREATMENT  Treatment of an abdominal aortic aneurysm depends on the size of your aneurysm, your age, and risk factors for rupture. Medication to control blood pressure and pain may be used to manage aneurysms smaller than 6 cm. Regular monitoring for enlargement may be recommended by your caregiver if:  The aneurysm is 3-4 cm in size (an annual ultrasonography may be recommended).  The aneurysm is 4-4.5 cm in size (an ultrasonography every 6 months may be recommended).  The aneurysm is larger than 4.5 cm in   size (your caregiver may ask that you be examined by a vascular surgeon). If your aneurysm is larger than 6 cm, surgical repair may be recommended. There are two main methods for repair of an aneurysm:   Endovascular  repair (a minimally invasive surgery). This is done most often.  Open repair. This method is used if an endovascular repair is not possible. Document Released: 02/25/2005 Document Revised: 09/12/2012 Document Reviewed: 06/17/2012 [employer] Patient Information 2015 [employer]. This information is not intended to replace advice given to you by your health care provider. Make sure you discuss any questions you have with your health care provider.  

## 2014-08-30 NOTE — Progress Notes (Signed)
[facility] [place]  Established EVAR  History of Present Illness  Vincent Velez is a 74 y.o. (May 29, 1941) male patient of Dr. 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 He returns today for routine follow up.  The patient has not had back or abdominal pain. He denies claudication symptoms, denies non-healing wounds. He denies history of stroke or TIA symptoms. States he is more physically active lately. His son died 12-14-13 in a motorcycle accident, he is grieving this loss but is coping better.  His medications include a daily 81 mg ASA and a statin.  Pt Diabetic: Yes, states lately not in as good control since he stopped smoking, home glucose checks are 120's Pt smoker: former smoker of a pipe and cigars, quit February 2016   Past Medical History  Diagnosis Date  . Allergy, unspecified not elsewhere classified   . Arthropathy, unspecified, site unspecified   . Malignant neoplasm of prostate   . Family history of malignant neoplasm of gastrointestinal tract   . Esophageal reflux   . Stricture and stenosis of esophagus   . Hiatal hernia   . Hypertension   . Hyperlipemia   . Diabetes mellitus   . COPD (chronic obstructive pulmonary disease)   . CAD (coronary artery disease)   . Peripheral vascular disease    Past Surgical History  Procedure Laterality Date  . Insertion prostate radiation seed    . Abdominal aorta stent     Social History History  Substance Use Topics  . Smoking status: Former Smoker    Quit date: 06/29/2014  . Smokeless tobacco: Never Used  . Alcohol Use: No   Family History Family History  Problem Relation Age of Onset  . Colon cancer Father   . Cancer Father     ? Colon  . Diabetes Mother   . Heart disease Mother     Heart Disease before age 72  . Deep vein thrombosis Mother   . Hyperlipidemia Mother   . Hypertension Mother   . Varicose Veins Mother   . Cancer Brother     Prostate  .  Hypertension Brother   . Heart attack Brother    Current Outpatient Prescriptions on File Prior to Visit  Medication Sig Dispense Refill  . aspirin 81 MG tablet Take 81 mg by mouth daily.      . brinzolamide (AZOPT) 1 % ophthalmic suspension Place 1 drop into both eyes 3 (three) times daily.      . Fluticasone-Salmeterol (ADVAIR DISKUS) 100-50 MCG/DOSE AEPB Inhale 1 puff into the lungs 2 (two) times daily.    Marland Kitchen glimepiride (AMARYL) 1 MG tablet Take 1 mg by mouth daily before breakfast.      . lisinopril (PRINIVIL,ZESTRIL) 5 MG tablet Take 1 tablet (5 mg total) by mouth daily. 90 tablet 2  . Multiple Vitamin (MULTIVITAMIN) capsule Take 1 capsule by mouth daily.      . Omega-3 Fatty Acids (FISH OIL) 1000 MG CAPS Take 1 capsule by mouth daily.      . simvastatin (ZOCOR) 40 MG tablet Take 40 mg by mouth at bedtime.      Marland Kitchen esomeprazole (NEXIUM) 40 MG capsule Take 1 capsule (40 mg total) by mouth daily. 90 capsule 3   No current facility-administered medications on file prior to visit.   No Known Allergies   ROS: See HPI for pertinent positives and negatives.  Physical Examination  Filed Vitals:   08/30/14 0941  BP: 124/79  Pulse: 50  Resp: 14  Height: 5\' 9"  (1.753 m)  Weight: 192 lb (87.091 kg)  SpO2: 97%   Body mass index is 28.34 kg/(m^2).  General: A&O x 3, WD.  Pulmonary: Sym exp, good air movt, CTAB, no rales, rhonchi, & wheezing.  Cardiac: RRR, Nl S1, S2, no Murmurs detected.  Vascular: Vessel Right Left  Radial 2+Palpable 2+Palpable  Carotid without bruit without bruit  Aorta Not palpable N/A  Femoral Palpable Palpable  Popliteal 2+ palpable 2+ palpable  PT Palpable Palpable  DP Palpable Palpable   Gastrointestinal: soft, NTND, -G/R, - HSM, - palpable masses, - CVAT B.  Musculoskeletal: M/S 5/5 throughout, Extremities without ischemic changes.  Neurologic: Pain and light touch intact in extremities, Motor exam  as listed above.          Non-Invasive Vascular Imaging  EVAR Duplex (Date: 08/30/2014) ABDOMINAL AORTA DUPLEX EVALUATION - POST ENDOVASCULAR REPAIR    INDICATION: Abdominal Aorta Aneurysm    PREVIOUS INTERVENTION(S): Endovascular Aortic Repair performed November 2010    DUPLEX EXAM:      DIAMETER AP (cm) DIAMETER TRANSVERSE (cm) VELOCITIES (cm/sec)  Aorta 4.75 3.88 77  Right Common Iliac 1.26 1.24 109  Left Common Iliac 1.32 1.31 99    Comparison Study       Date DIAMETER AP (cm) DIAMETER TRANSVERSE (cm)  06/08/2014 4.46 4.29     ADDITIONAL FINDINGS:     IMPRESSION: Patent Endovascular Aortic Repair with maximum diameter of 4.75cm AP x 3.88cm TRV.    Compared to the previous exam:  Decrease in diameters since previous study on 06/08/2014.       Medical Decision Making  Vincent Velez is a 74 y.o. male who presents s/p EVAR (Date: November 2010).  Pt is asymptomatic with decrease in overall sac size even though one dimension is longer than previously. I discussed with Dr. Donnetta Hutching pt's HPI, Duplex results from today and 3 months ago.  I discussed with the patient the importance of surveillance of the endograft.  The next endograft duplex will be scheduled for 6 months.  Will also evaluate for possible popliteal aneurysms at that time at both popliteal pulses are 2+ palpable  The patient will follow up with [place] in 6 months with these studies.  I emphasized the importance of maximal medical management including strict control of blood pressure, blood glucose, and lipid levels, antiplatelet agents, obtaining regular exercise, and cessation of smoking.   The patient was given information about AAA including signs, symptoms, treatment, and how to minimize the risk of enlargement and rupture of aneurysms.   Face to face time with patient was 25 minutes. Over 50% of this time was spent on counseling and coordination of care.  Thank you for allowing [place] to participate in  this patient's care.  Clemon Chambers, RN, MSN, FNP-C [facility] [place] Office: [phone]  Clinic Physician: Early  08/30/2014, 9:56 AM

## 2014-09-14 ENCOUNTER — Other Ambulatory Visit: Payer: Self-pay | Admitting: Cardiology

## 2014-09-18 ENCOUNTER — Encounter: Payer: Self-pay | Admitting: Cardiology

## 2014-09-18 ENCOUNTER — Ambulatory Visit (INDEPENDENT_AMBULATORY_CARE_PROVIDER_SITE_OTHER): Payer: Medicare Other | Admitting: Cardiology

## 2014-09-18 VITALS — BP 144/78 | HR 58 | Ht 69.0 in | Wt 195.4 lb

## 2014-09-18 DIAGNOSIS — I350 Nonrheumatic aortic (valve) stenosis: Secondary | ICD-10-CM

## 2014-09-18 DIAGNOSIS — E785 Hyperlipidemia, unspecified: Secondary | ICD-10-CM

## 2014-09-18 DIAGNOSIS — R001 Bradycardia, unspecified: Secondary | ICD-10-CM

## 2014-09-18 DIAGNOSIS — Z87891 Personal history of nicotine dependence: Secondary | ICD-10-CM | POA: Diagnosis not present

## 2014-09-18 NOTE — Progress Notes (Signed)
[facility]. [address], [address] [place], [place]  [address] Phone: [phone] Fax:  [phone]  Date:  09/18/2014   ID:  Vincent Velez, DOB [birth year], MRN [id number]  PCP:  Vincent Hams, MD   History of Present Illness: Vincent Velez is a 74 y.o. male with aortic stenosis, for followup after stopping beta blocker because of bradycardia. He is currently feeling more energy with increased heart rate. No syncope, no dizziness. He is doing well. His blood pressure also remains under good control which is very important with AAA.  Stress test was performed in November 2010 showing a small area of possible ischemia in the apical lateral segment. We agreed on medical management and optimizing his risk factor modification. His LDL is excellent. He also has a history of stent graft to his abdominal aorta 2011 for which Dr. Oneida Velez follows. He has not been having any abdominal pain.   He has been compliant with his statin medications.   Shoulder arthritis.  Quit smoking 07/2014. Allergies. Pipe, cigar   Wt Readings from Last 3 Encounters:  09/18/14 195 lb 6.4 oz (88.633 kg)  08/30/14 192 lb (87.091 kg)  06/08/14 191 lb (86.637 kg)     Past Medical History  Diagnosis Date  . Allergy, unspecified not elsewhere classified   . Arthropathy, unspecified, site unspecified   . Malignant neoplasm of prostate   . Family history of malignant neoplasm of gastrointestinal tract   . Esophageal reflux   . Stricture and stenosis of esophagus   . Hiatal hernia   . Hypertension   . Hyperlipemia   . Diabetes mellitus   . COPD (chronic obstructive pulmonary disease)   . CAD (coronary artery disease)   . Peripheral vascular disease     Past Surgical History  Procedure Laterality Date  . Insertion prostate radiation seed    . Abdominal aorta stent      Current Outpatient Prescriptions  Medication Sig Dispense Refill  . aspirin 81 MG tablet Take 81 mg by mouth daily.      .  brinzolamide (AZOPT) 1 % ophthalmic suspension Place 1 drop into both eyes 3 (three) times daily.      Marland Kitchen esomeprazole (NEXIUM) 40 MG capsule Take 1 capsule (40 mg total) by mouth daily. 90 capsule 3  . Fluticasone-Salmeterol (ADVAIR DISKUS) 100-50 MCG/DOSE AEPB Inhale 1 puff into the lungs 2 (two) times daily.    Marland Kitchen glimepiride (AMARYL) 1 MG tablet Take 1 mg by mouth daily before breakfast.      . lisinopril (PRINIVIL,ZESTRIL) 5 MG tablet Take 1 tablet by mouth  daily 90 tablet 0  . Multiple Vitamin (MULTIVITAMIN) capsule Take 1 capsule by mouth daily.      . Omega-3 Fatty Acids (FISH OIL) 1000 MG CAPS Take 1 capsule by mouth daily.      . simvastatin (ZOCOR) 40 MG tablet Take 40 mg by mouth at bedtime.       No current facility-administered medications for this visit.    Allergies:   No Known Allergies  Social History:  The patient  reports that he quit smoking about 2 months ago. He has never used smokeless tobacco. He reports that he does not drink alcohol or use illicit drugs.   ROS:  Please see the history of present illness.   Denies syncope, bleeding, or anemia  PHYSICAL EXAM: VS:  BP 144/78 mmHg  Pulse 58  Ht '5\' 9"'$  (1.753 m)  Wt 195 lb  6.4 oz (88.633 kg)  BMI 28.84 kg/m2 Well nourished, well developed, in no acute distress HEENT: normal Neck: no JVD Cardiac:  normal S1, S2; RRR; 2/6 SEM murmur Lungs:  clear to auscultation bilaterally, no wheezing, rhonchi or rales Abd: soft, nontender, no hepatomegaly Ext: no edema Skin: warm and dry Neuro: no focal abnormalities noted  EKG:  09/18/14-sinus bradycardia rate 58 with nonspecific ST-T wave changes-prior 09/15/13-Sinus rhythm, PVCs   ECHO 2015 -  1. Normal LV size and function. 2. Left ventricular ejection fraction estimated by 2D at 60-65 percent. 3. There were no regional wall motion abnormalities. 4. Trace mitral valve regurgitation. 5. Trivial tricuspid regurgitation. 6. Moderate calcification of the aortic valve. 7.  Moderate aortic valve stenosis. Peak velocity 2.56ms, mean gradient 186mg. Advanced from mild 2010. 8. Mild aortic valve regurgitation. 9. There is mild aortic root dilatation. 4.1cm ascending root. 10. Analysis of mitral valve inflow, pulmonary vein Doppler and tissue Doppler suggests grade I diastolic dysfunction without elevated left atrial pressure.  ASSESSMENT AND PLAN:  1. Bradycardia-resolved after discontinuation of beta blocker. EKG stable. 2. Aortic stenosis-moderate severity. Repeat echocardiogram. Has mild shortness of breath but no significant symptoms. No angina. 3. S/p AAA - Dr. FiOneida AlarDoing well. No complaints. 4. Hyperlipidemia-on statin. 4/15 - LDL 60 5. Tobacco cessation-great job on quitting February 2016. Cigars, pipe.  Signed, Vincent FurbishMD [facility]ADuke [facility]4/19/2016 10:31 AM

## 2014-09-18 NOTE — Patient Instructions (Signed)
Medication Instructions:  No changes  Labwork: None  Testing/Procedures: Your physician has requested that you have an echocardiogram. Echocardiography is a painless test that uses sound waves to create images of your heart. It provides your doctor with information about the size and shape of your heart and how well your heart's chambers and valves are working. This procedure takes approximately one hour. There are no restrictions for this procedure.  Follow-Up: Follow up in 1 year with Dr. Marlou Porch.  You will receive a letter in the mail 2 months before you are due.  Please call us when you receive this letter to schedule your follow up appointment.  Thank you for choosing [facility]!!

## 2014-09-21 ENCOUNTER — Ambulatory Visit (HOSPITAL_COMMUNITY): Payer: Medicare Other | Attending: Cardiology | Admitting: Radiology

## 2014-09-21 DIAGNOSIS — I350 Nonrheumatic aortic (valve) stenosis: Secondary | ICD-10-CM

## 2014-09-21 DIAGNOSIS — I10 Essential (primary) hypertension: Secondary | ICD-10-CM | POA: Insufficient documentation

## 2014-09-21 DIAGNOSIS — I359 Nonrheumatic aortic valve disorder, unspecified: Secondary | ICD-10-CM | POA: Insufficient documentation

## 2014-09-21 DIAGNOSIS — E119 Type 2 diabetes mellitus without complications: Secondary | ICD-10-CM | POA: Diagnosis not present

## 2014-09-21 DIAGNOSIS — Z87891 Personal history of nicotine dependence: Secondary | ICD-10-CM | POA: Diagnosis not present

## 2014-09-21 DIAGNOSIS — E785 Hyperlipidemia, unspecified: Secondary | ICD-10-CM | POA: Diagnosis not present

## 2014-09-21 NOTE — Progress Notes (Signed)
Echocardiogram performed.  

## 2014-10-19 DIAGNOSIS — S90812A Abrasion, left foot, initial encounter: Secondary | ICD-10-CM | POA: Diagnosis not present

## 2014-11-10 DIAGNOSIS — T7840XA Allergy, unspecified, initial encounter: Secondary | ICD-10-CM | POA: Diagnosis not present

## 2014-12-10 ENCOUNTER — Other Ambulatory Visit: Payer: Self-pay | Admitting: Cardiology

## 2014-12-10 DIAGNOSIS — H4011X1 Primary open-angle glaucoma, mild stage: Secondary | ICD-10-CM | POA: Diagnosis not present

## 2014-12-10 DIAGNOSIS — E119 Type 2 diabetes mellitus without complications: Secondary | ICD-10-CM | POA: Diagnosis not present

## 2014-12-10 DIAGNOSIS — Z961 Presence of intraocular lens: Secondary | ICD-10-CM | POA: Diagnosis not present

## 2014-12-10 DIAGNOSIS — D2312 Other benign neoplasm of skin of left eyelid, including canthus: Secondary | ICD-10-CM | POA: Diagnosis not present

## 2015-03-19 ENCOUNTER — Encounter: Payer: Self-pay | Admitting: Family

## 2015-03-21 ENCOUNTER — Ambulatory Visit (HOSPITAL_COMMUNITY)
Admission: RE | Admit: 2015-03-21 | Discharge: 2015-03-21 | Disposition: A | Discharge status: Home / Self Care | Payer: Medicare Other | Source: Ambulatory Visit | Attending: Family | Admitting: Family

## 2015-03-21 ENCOUNTER — Ambulatory Visit (INDEPENDENT_AMBULATORY_CARE_PROVIDER_SITE_OTHER): Payer: Medicare Other | Admitting: Family

## 2015-03-21 ENCOUNTER — Ambulatory Visit (INDEPENDENT_AMBULATORY_CARE_PROVIDER_SITE_OTHER)
Admission: RE | Admit: 2015-03-21 | Discharge: 2015-03-21 | Disposition: A | Discharge status: Home / Self Care | Payer: Medicare Other | Source: Ambulatory Visit | Attending: Family | Admitting: Family

## 2015-03-21 ENCOUNTER — Other Ambulatory Visit: Payer: Self-pay | Admitting: Family

## 2015-03-21 ENCOUNTER — Encounter: Payer: Self-pay | Admitting: Family

## 2015-03-21 VITALS — BP 127/74 | HR 52 | Ht 69.0 in | Wt 191.8 lb

## 2015-03-21 DIAGNOSIS — I714 Abdominal aortic aneurysm, without rupture, unspecified: Secondary | ICD-10-CM

## 2015-03-21 DIAGNOSIS — Z87891 Personal history of nicotine dependence: Secondary | ICD-10-CM

## 2015-03-21 DIAGNOSIS — Z95828 Presence of other vascular implants and grafts: Secondary | ICD-10-CM | POA: Diagnosis not present

## 2015-03-21 DIAGNOSIS — Z48812 Encounter for surgical aftercare following surgery on the circulatory system: Secondary | ICD-10-CM

## 2015-03-21 DIAGNOSIS — R0989 Other specified symptoms and signs involving the circulatory and respiratory systems: Secondary | ICD-10-CM

## 2015-03-21 NOTE — Progress Notes (Signed)
[facility] [place]  Established EVAR  History of Present Illness  Vincent Velez is a 74 y.o. ([birth year]) male patient of Dr. 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 He returns today for routine follow up and also for duplex evaluation of prominent popliteal pulses.  The patient has not had back or abdominal pain. He denies claudication symptoms, denies non-healing wounds. He denies history of stroke or TIA symptoms. States he is more physically active lately. His son died December 24, 2013 in a motorcycle accident, he is grieving this loss but is coping better. Vincent Velez is having post-operative back surgery issues re scoliosis and pt is stressed re this, he has resumed 1 cigar/day.   He sees Dr. Marlou Porch who monitors pt's mild CAD and cardiac murmur, per pt.  His medications include a daily 81 mg ASA and a statin.  Pt Diabetic: Yes Pt smoker: smokes 1 cigar/day    Past Medical History  Diagnosis Date  . Allergy, unspecified not elsewhere classified   . Arthropathy, unspecified, site unspecified   . Malignant neoplasm of prostate ([place])   . Family history of malignant neoplasm of gastrointestinal tract   . Esophageal reflux   . Stricture and stenosis of esophagus   . Hiatal hernia   . Hypertension   . Hyperlipemia   . Diabetes mellitus   . COPD (chronic obstructive pulmonary disease) ([place])   . CAD (coronary artery disease)   . Peripheral vascular disease [facility])    Past Surgical History  Procedure Laterality Date  . Insertion prostate radiation seed    . Abdominal aorta stent     Social History Social History   Social History  . Marital Status: Married    Spouse Name: N/A  . Number of Children: 2  . Years of Education: N/A   Occupational History  . engineer [employer]   Social History Main Topics  . Smoking status: Light Tobacco Smoker    Types: Cigars  . Smokeless tobacco: Never Used  . Alcohol Use: No  . Drug Use:  No  . Sexual Activity: Not on file   Other Topics Concern  . Not on file   Social History Narrative   Family History Family History  Problem Relation Age of Onset  . Colon cancer Father   . Cancer Father     ? Colon  . Diabetes Mother   . Heart disease Mother     Heart Disease before age 77  . Deep vein thrombosis Mother   . Hyperlipidemia Mother   . Hypertension Mother   . Varicose Veins Mother   . Cancer Brother     Prostate  . Hypertension Brother   . Heart attack Brother     Current Outpatient Prescriptions on File Prior to Visit  Medication Sig Dispense Refill  . aspirin 81 MG tablet Take 81 mg by mouth daily.      . brinzolamide (AZOPT) 1 % ophthalmic suspension Place 1 drop into both eyes 3 (three) times daily.      . Fluticasone-Salmeterol (ADVAIR DISKUS) 100-50 MCG/DOSE AEPB Inhale 1 puff into the lungs 2 (two) times daily.    Marland Kitchen glimepiride (AMARYL) 1 MG tablet Take 1 mg by mouth daily before breakfast.      . lisinopril (PRINIVIL,ZESTRIL) 5 MG tablet Take 1 tablet by mouth  daily 90 tablet 3  . Multiple Vitamin (MULTIVITAMIN) capsule Take 1 capsule by mouth daily.      . Omega-3  Fatty Acids (FISH OIL) 1000 MG CAPS Take 1 capsule by mouth daily.      . simvastatin (ZOCOR) 40 MG tablet Take 40 mg by mouth at bedtime.      Marland Kitchen esomeprazole (NEXIUM) 40 MG capsule Take 1 capsule (40 mg total) by mouth daily. 90 capsule 3   No current facility-administered medications on file prior to visit.   No Known Allergies  ROS: See HPI for pertinent positives and negatives.  Physical Examination  Filed Vitals:   03/21/15 0902  BP: 127/74  Pulse: 52  Height: '5\' 9"'$  (1.753 m)  Weight: 191 lb 12.8 oz (87 kg)  SpO2: 95%   Body mass index is 28.31 kg/(m^2).  General: A&O x 3, WD.  Pulmonary: Sym exp, good air movt, CTAB, no rales, rhonchi, & wheezing.  Cardiac: RRR, Nl S1, S2, + murmur.  Vascular: Vessel Right Left  Radial 2+Palpable 2+Palpable   Carotid without bruit without bruit  Aorta Not palpable N/A  Femoral Palpable Palpable  Popliteal 2+ palpable 2+ palpable  PT Palpable Palpable  DP Palpable Palpable   Gastrointestinal: soft, NTND, -G/R, - HSM, - palpable masses, - CVAT B.  Musculoskeletal: M/S 5/5 throughout, Extremities without ischemic changes.  Neurologic: Pain and light touch intact in extremities, Motor exam as listed above.               Non-Invasive Vascular Imaging  EVAR Duplex (03/21/2015) ABDOMINAL AORTA DUPLEX EVALUATION - POST ENDOVASCULAR REPAIR    INDICATION: Abdominal Aortic Aneurysm    PREVIOUS INTERVENTION(S): Endovascular Aortic Repair performed 04/11/2009.    DUPLEX EXAM:      DIAMETER AP (cm) DIAMETER TRANSVERSE (cm) VELOCITIES (cm/sec)  Aorta 4.27 4.18 57  Right Common Iliac 1.38 1.40 89  Left Common Iliac 1.43 1.12 82    Comparison Study       Date DIAMETER AP (cm) DIAMETER TRANSVERSE (cm)  08/30/2014 4.75 3.88     ADDITIONAL FINDINGS:     IMPRESSION: Patent abdominal aortic Endovascular Aortic Repair with maximum diameter of 4.27cm AP x 4.18cm TRV.    Compared to the previous exam:  Stable since previous study on 08/30/2014.      LOWER EXTREMITY ARTERIAL DUPLEX EVALUATION      INDICATION: Prominent popliteal pulse; History of abdominal aortic aneurysm with repair    PREVIOUS INTERVENTION(S): NA    DUPLEX EXAM:     Popliteal Artery Diameter   AP (cm) Transverse (cm)  Right 0.86 0.98  Left 0.87 0.87     ADDITIONAL FINDINGS:     IMPRESSION: Patent bilateral popliteal arteries, diameters within normal limits.     Medical Decision Making  The patient is a 74 y.o. male who presents s/p EVAR November 2010. He also has prominent popliteal pulses; however, duplex of bilateral popliteal arteries demonstrates no popliteal artery aneurysms. EVAR duplex today demonstrates a smaller sac size than six months  prior, normal size iliac arteries.   Based on this patient's exam and diagnostic studies, the patient will follow up in 1 year  with the following studies: EVAR duplex.  Consideration for repair of AAA would be made when the size is 5.5 cm, growth > 1 cm/yr, and symptomatic status.  I emphasized the importance of maximal medical management including strict control of blood pressure, blood glucose, and lipid levels, antiplatelet agents, obtaining regular exercise, and cessation of smoking.   The patient was given information about AAA including signs, symptoms, treatment, and how to minimize the risk of enlargement and rupture  of aneurysms.    The patient was advised to call 911 should the patient experience sudden onset abdominal or back pain.   Thank you for allowing [place] to participate in this patient's care.  Clemon Chambers, RN, MSN, FNP-C [facility] [place] Office: [facility] Physician: Oneida Alar  03/21/2015, 9:26 AM

## 2015-03-22 DIAGNOSIS — Z23 Encounter for immunization: Secondary | ICD-10-CM | POA: Diagnosis not present

## 2015-03-22 DIAGNOSIS — F419 Anxiety disorder, unspecified: Secondary | ICD-10-CM | POA: Diagnosis not present

## 2015-03-22 DIAGNOSIS — I10 Essential (primary) hypertension: Secondary | ICD-10-CM | POA: Diagnosis not present

## 2015-03-22 DIAGNOSIS — J449 Chronic obstructive pulmonary disease, unspecified: Secondary | ICD-10-CM | POA: Diagnosis not present

## 2015-03-22 DIAGNOSIS — C61 Malignant neoplasm of prostate: Secondary | ICD-10-CM | POA: Diagnosis not present

## 2015-03-22 DIAGNOSIS — Z1389 Encounter for screening for other disorder: Secondary | ICD-10-CM | POA: Diagnosis not present

## 2015-03-22 DIAGNOSIS — E784 Other hyperlipidemia: Secondary | ICD-10-CM | POA: Diagnosis not present

## 2015-03-22 DIAGNOSIS — Z0000 Encounter for general adult medical examination without abnormal findings: Secondary | ICD-10-CM | POA: Diagnosis not present

## 2015-03-22 DIAGNOSIS — I714 Abdominal aortic aneurysm, without rupture: Secondary | ICD-10-CM | POA: Diagnosis not present

## 2015-03-22 DIAGNOSIS — E139 Other specified diabetes mellitus without complications: Secondary | ICD-10-CM | POA: Diagnosis not present

## 2015-03-22 DIAGNOSIS — I359 Nonrheumatic aortic valve disorder, unspecified: Secondary | ICD-10-CM | POA: Diagnosis not present

## 2015-03-26 NOTE — Addendum Note (Signed)
Addended by: Dorthula Rue L on: 03/26/2015 02:40 PM   Modules accepted: Orders

## 2015-06-12 DIAGNOSIS — H401122 Primary open-angle glaucoma, left eye, moderate stage: Secondary | ICD-10-CM | POA: Diagnosis not present

## 2015-06-12 DIAGNOSIS — H401111 Primary open-angle glaucoma, right eye, mild stage: Secondary | ICD-10-CM | POA: Diagnosis not present

## 2015-09-17 DIAGNOSIS — H401122 Primary open-angle glaucoma, left eye, moderate stage: Secondary | ICD-10-CM | POA: Diagnosis not present

## 2015-09-17 DIAGNOSIS — H401112 Primary open-angle glaucoma, right eye, moderate stage: Secondary | ICD-10-CM | POA: Diagnosis not present

## 2015-09-20 ENCOUNTER — Other Ambulatory Visit (HOSPITAL_COMMUNITY): Payer: Self-pay | Admitting: Internal Medicine

## 2015-09-20 DIAGNOSIS — E7800 Pure hypercholesterolemia, unspecified: Secondary | ICD-10-CM | POA: Diagnosis not present

## 2015-09-20 DIAGNOSIS — F419 Anxiety disorder, unspecified: Secondary | ICD-10-CM | POA: Diagnosis not present

## 2015-09-20 DIAGNOSIS — I499 Cardiac arrhythmia, unspecified: Secondary | ICD-10-CM | POA: Diagnosis not present

## 2015-09-20 DIAGNOSIS — I10 Essential (primary) hypertension: Secondary | ICD-10-CM | POA: Diagnosis not present

## 2015-09-20 DIAGNOSIS — E139 Other specified diabetes mellitus without complications: Secondary | ICD-10-CM | POA: Diagnosis not present

## 2015-09-24 DIAGNOSIS — M25511 Pain in right shoulder: Secondary | ICD-10-CM | POA: Diagnosis not present

## 2015-10-18 ENCOUNTER — Other Ambulatory Visit: Payer: Self-pay

## 2015-10-18 ENCOUNTER — Ambulatory Visit (HOSPITAL_COMMUNITY): Payer: Medicare Other | Attending: Cardiology

## 2015-10-18 DIAGNOSIS — E119 Type 2 diabetes mellitus without complications: Secondary | ICD-10-CM | POA: Insufficient documentation

## 2015-10-18 DIAGNOSIS — J449 Chronic obstructive pulmonary disease, unspecified: Secondary | ICD-10-CM | POA: Insufficient documentation

## 2015-10-18 DIAGNOSIS — I2510 Atherosclerotic heart disease of native coronary artery without angina pectoris: Secondary | ICD-10-CM | POA: Diagnosis not present

## 2015-10-18 DIAGNOSIS — I499 Cardiac arrhythmia, unspecified: Secondary | ICD-10-CM | POA: Diagnosis not present

## 2015-10-18 DIAGNOSIS — I082 Rheumatic disorders of both aortic and tricuspid valves: Secondary | ICD-10-CM | POA: Diagnosis not present

## 2015-10-18 DIAGNOSIS — Z87891 Personal history of nicotine dependence: Secondary | ICD-10-CM | POA: Insufficient documentation

## 2015-10-18 DIAGNOSIS — E785 Hyperlipidemia, unspecified: Secondary | ICD-10-CM | POA: Diagnosis not present

## 2015-10-18 DIAGNOSIS — I10 Essential (primary) hypertension: Secondary | ICD-10-CM | POA: Insufficient documentation

## 2015-10-20 NOTE — Progress Notes (Signed)
[facility] [address], [address] [place], [place]  [address] Phone: [phone] Fax:  [phone]  Date:  10/21/2015   ID:  Vincent Velez, DOB [birth year], MRN [id number]  PCP:  Kandice Hams, MD   History of Present Illness: Vincent Velez is a 75 y.o. male with moderate aortic stenosis, abdominal aortic aneurysm, for evaluation of AFIB, discovered April 2017 possibly beginning in January/February 2017 after a flight to [facility] to help his sister.  Previously stopped his beta blocker because of bradycardia. His blood pressure also remains under good control which is very important with AAA of 4.27 cm .  Stress test was performed in November 2010 showing a small area of possible ischemia in the apical lateral segment. We agreed on medical management and optimizing his risk factor modification. His LDL is excellent. He also has a history of stent graft to his abdominal aorta 2011 for which Dr. Oneida Alar follows. He has not been having any abdominal pain.   Felt weak, dizzy, not as good, wobbly. Not as stable. Fell hurt R shoulder. No CP. No prior strokes. Was diagnosed with atrial fibrillation by Dr. Delfina Redwood on 09/20/15. He gave him Eliquis but ran out of samples.  He has been compliant with his statin medications.   Shoulder arthritis.  Quit smoking 07/2014. Allergies. Pipe, cigar.     Wt Readings from Last 3 Encounters:  10/21/15 182 lb 3.2 oz (82.645 kg)  03/21/15 191 lb 12.8 oz (87 kg)  09/18/14 195 lb 6.4 oz (88.633 kg)     Past Medical History  Diagnosis Date  . Allergy, unspecified not elsewhere classified   . Arthropathy, unspecified, site unspecified   . Malignant neoplasm of prostate ([place])   . Family history of malignant neoplasm of gastrointestinal tract   . Esophageal reflux   . Stricture and stenosis of esophagus   . Hiatal hernia   . Hypertension   . Hyperlipemia   . Diabetes mellitus   . COPD (chronic obstructive pulmonary disease) ([place])   . CAD (coronary  artery disease)   . Peripheral vascular disease [facility])     Past Surgical History  Procedure Laterality Date  . Insertion prostate radiation seed    . Abdominal aorta stent      Current Outpatient Prescriptions  Medication Sig Dispense Refill  . brinzolamide (AZOPT) 1 % ophthalmic suspension Place 1 drop into both eyes 3 (three) times daily.      Marland Kitchen esomeprazole (NEXIUM) 40 MG capsule Take 40 mg by mouth daily at 12 noon.    . Fluticasone-Salmeterol (ADVAIR DISKUS) 100-50 MCG/DOSE AEPB Inhale 1 puff into the lungs 2 (two) times daily.    Marland Kitchen glimepiride (AMARYL) 1 MG tablet Take 1 mg by mouth daily before breakfast.      . Multiple Vitamin (MULTIVITAMIN) capsule Take 1 capsule by mouth daily.      . Omega-3 Fatty Acids (FISH OIL) 1000 MG CAPS Take 1 capsule by mouth daily.      . simvastatin (ZOCOR) 40 MG tablet Take 40 mg by mouth at bedtime.      . metoprolol tartrate (LOPRESSOR) 25 MG tablet Take 1 tablet (25 mg total) by mouth 2 (two) times daily. 180 tablet 3  . rivaroxaban (XARELTO) 20 MG TABS tablet Take 1 tablet (20 mg total) by mouth daily with supper. 90 tablet 3   No current facility-administered medications for this visit.    Allergies:   No Known Allergies  Social History:  The patient  reports that he has been smoking Cigars.  He has never used smokeless tobacco. He reports that he does not drink alcohol or use illicit drugs.   ROS:  Please see the history of present illness.   Denies syncope, bleeding, or anemia  PHYSICAL EXAM: VS:  BP 114/64 mmHg  Pulse 104  Ht '5\' 9"'$  (1.753 m)  Wt 182 lb 3.2 oz (82.645 kg)  BMI 26.89 kg/m2 Well nourished, well developed, in no acute distress HEENT: normal Neck: no JVD Cardiac:  Irregularly irregular, mildly tachycardic; 3/6 SEM murmur Lungs:  clear to auscultation bilaterally, no wheezing, rhonchi or rales Abd: soft, nontender, no hepatomegaly Ext: no edema Skin: warm and dry Neuro: no focal abnormalities noted  EKG:  EKG was  ordered today. 10/21/15-atrial fibrillation heart rate 104 with rapid ventricular response,PVC noted, nonspecific ST-T wave changes. Personally viewed. 09/18/14-sinus bradycardia rate 58 with nonspecific ST-T wave changes-prior 09/15/13-Sinus rhythm, PVCs   ECHO 2015 -  1. Normal LV size and function. 2. Left ventricular ejection fraction estimated by 2D at 60-65 percent. 3. There were no regional wall motion abnormalities. 4. Trace mitral valve regurgitation. 5. Trivial tricuspid regurgitation. 6. Moderate calcification of the aortic valve. 7. Moderate aortic valve stenosis. Peak velocity 2.40ms, mean gradient 128mg. Advanced from mild 2010. 8. Mild aortic valve regurgitation. 9. There is mild aortic root dilatation. 4.1cm ascending root. 10. Analysis of mitral valve inflow, pulmonary vein Doppler and tissue Doppler suggests grade I diastolic dysfunction without elevated left atrial pressure.  ECHO 10/18/15 - Left ventricle: The cavity size was normal. Systolic function was  mildly reduced. The estimated ejection fraction was in the range  of 45% to 50%. Diffuse hypokinesis. - Aortic valve: Valve mobility was restricted. There was moderate  stenosis. There was moderate regurgitation. Peak velocity (S):  338 cm/s. Mean gradient (S): 25 mm Hg. - Left atrium: The atrium was moderately dilated. - Right ventricle: The cavity size was mildly dilated. Wall  thickness was normal.  Impressions:  - Aortic stenosis has mildly advanced since prior study.  ASSESSMENT AND PLAN:  1. Atrial fibrillation - May have started 07/2015. Dr. polite did give him samples of Eliquis but he is now out. I will try Xarelto 20 mg once a day. After 3 weeks of use, we will set him up for cardioversion to see if we can restore normal rhythm. Risks and benefits of study have been discussed. I will also give him metoprolol 25 mg twice a day to improve rate control. I will stop his lisinopril because his blood  pressure is a bit low. We may be a little resume that medication at a further date or may be at a lower dosage for renal protection in the setting of diabetes. He understands bleeding risk. He understands stroke risk with atrial fibrillation. Stop aspirin. CHADS-VASc (1 diabetes, 1 age, 1 PVD (AAA)-3 total. Dr. PoDelfina Redwoodid give him diltiazem 120 mg but he finished 3 days ago. He has not been taking. 2. Bradycardia-resolved after discontinuation of beta blocker. However, with his atrial fibrillation currently heart rate is 104 bpm. We are resuming low-dose beta blocker for rate control. Post cardioversion we may see bradycardia once again. 3. Aortic stenosis-moderate severity. Echocardiogram showed peak velocity of 3.4 m/s, mean gradient of 25 mmHg, EF 45-50%. Mildly progressed. Has mild shortness of breath but no significant symptoms. No angina. 4. S/p AAA -4.27 cm Dr. FiOneida AlarDoing well. No complaints. 5. Hyperlipidemia-on statin. 4/15 - LDL 60 6.  Tobacco cessation-great job on quitting February 2016. Cigars, pipe. 7. We will see him back post cardioversion.  Signed, Candee Furbish, MD [facility]  10/21/2015 11:15 AM

## 2015-10-21 ENCOUNTER — Encounter: Payer: Self-pay | Admitting: Cardiology

## 2015-10-21 ENCOUNTER — Ambulatory Visit (INDEPENDENT_AMBULATORY_CARE_PROVIDER_SITE_OTHER): Payer: Medicare Other | Admitting: Cardiology

## 2015-10-21 VITALS — BP 114/64 | HR 104 | Ht 69.0 in | Wt 182.2 lb

## 2015-10-21 DIAGNOSIS — I714 Abdominal aortic aneurysm, without rupture, unspecified: Secondary | ICD-10-CM

## 2015-10-21 DIAGNOSIS — I350 Nonrheumatic aortic (valve) stenosis: Secondary | ICD-10-CM | POA: Diagnosis not present

## 2015-10-21 DIAGNOSIS — E785 Hyperlipidemia, unspecified: Secondary | ICD-10-CM

## 2015-10-21 DIAGNOSIS — I480 Paroxysmal atrial fibrillation: Secondary | ICD-10-CM

## 2015-10-21 MED ORDER — METOPROLOL TARTRATE 25 MG PO TABS: 25.0000 mg | ORAL_TABLET | Freq: Two times a day (BID) | ORAL | Status: DC

## 2015-10-21 MED ORDER — RIVAROXABAN 20 MG PO TABS: 20.0000 mg | ORAL_TABLET | Freq: Every day | ORAL | Status: DC

## 2015-10-21 NOTE — Patient Instructions (Signed)
Medication Instructions:  Please stop your ASA and Lisinopril. Start Xarelto 20 mg a day and Metoprolol tartrate 25 mg twice a day. Continue all other medications as listed.  Labwork: None  Testing/Procedures: Your physician has requested that you have a Cardioversion.  Electrical Cardioversion uses a jolt of electricity to your heart either through paddles or wired patches attached to your chest. This is a controlled, usually prescheduled, procedure. This procedure is done at the hospital and you are not awake during the procedure. You usually go home the day of the procedure. Please see the instruction sheet given to you today for more information.  Follow-Up: Follow up approximately 2 weeks after your cardioversion.  If you need a refill on your cardiac medications before your next appointment, please call your pharmacy.  Thank you for choosing [facility]!!

## 2015-10-22 ENCOUNTER — Telehealth: Payer: Self-pay

## 2015-10-22 NOTE — Telephone Encounter (Signed)
Prior auth for Xarelto '20mg'$  submitted to [employer].

## 2015-10-24 ENCOUNTER — Telehealth: Payer: Self-pay

## 2015-10-24 DIAGNOSIS — I714 Abdominal aortic aneurysm, without rupture: Secondary | ICD-10-CM | POA: Diagnosis not present

## 2015-10-24 DIAGNOSIS — E7800 Pure hypercholesterolemia, unspecified: Secondary | ICD-10-CM | POA: Diagnosis not present

## 2015-10-24 DIAGNOSIS — I480 Paroxysmal atrial fibrillation: Secondary | ICD-10-CM | POA: Diagnosis not present

## 2015-10-24 DIAGNOSIS — I10 Essential (primary) hypertension: Secondary | ICD-10-CM | POA: Diagnosis not present

## 2015-10-24 DIAGNOSIS — E139 Other specified diabetes mellitus without complications: Secondary | ICD-10-CM | POA: Diagnosis not present

## 2015-10-24 NOTE — Telephone Encounter (Signed)
Xarelto approved through 05/31/2016. [id number].

## 2015-10-25 ENCOUNTER — Other Ambulatory Visit: Payer: Self-pay | Admitting: Cardiology

## 2015-11-04 ENCOUNTER — Telehealth: Payer: Self-pay | Admitting: Cardiology

## 2015-11-04 NOTE — Telephone Encounter (Signed)
New message  Pt came in for the appt on 10/21/2015 and he states that he was advised to have Cardioversion. Pt state that he hasn't received a call to schedule. Please call back to clarify if the appt is needed.

## 2015-11-04 NOTE — Telephone Encounter (Signed)
Pt called in as he was expecting to be setup for a Cardioversion and he has not heard anything since his 5/22 OV with Dr. Marlou Porch. Pt stated he started taking his Xarelto 20 mg as instructed but he has missed some doses. Pt stated that he forgets or gets off schedule when he is not home as he tries to take with dinner at 6 pm each evening. Pt instructed that as long as he takes it that evening when he gets home that is fine but NOT the next morning. Pt going to restart his 3 weeks of taking the medication uninterrupted today for the next 3 weeks, he will call our office back the week of June 26th to schedule his cardioversion for the first week of July.  Pt stated he was unsure of having transportation this far out to schedule it today but will let us know when he calls back. Pt verbalized understanding, no additional questions at this time.

## 2015-11-17 ENCOUNTER — Other Ambulatory Visit: Payer: Self-pay | Admitting: Cardiology

## 2015-11-22 ENCOUNTER — Telehealth: Payer: Self-pay | Admitting: Cardiology

## 2015-11-22 DIAGNOSIS — I480 Paroxysmal atrial fibrillation: Secondary | ICD-10-CM

## 2015-11-22 DIAGNOSIS — Z01818 Encounter for other preprocedural examination: Secondary | ICD-10-CM

## 2015-11-22 DIAGNOSIS — Z79899 Other long term (current) drug therapy: Secondary | ICD-10-CM

## 2015-11-22 NOTE — Telephone Encounter (Signed)
Spoke with pt who reports he had not missed any doses of medication.  He is aware he can not been scheduled until after 6/26.  He is requesting July 5, 6 or 7th.  Advised I will call him back next week hopefully with a date and time.  He states understanding.

## 2015-11-22 NOTE — Telephone Encounter (Signed)
[name] is calling to set up a Cardioversion . Please call  Thanks

## 2015-11-29 ENCOUNTER — Other Ambulatory Visit (INDEPENDENT_AMBULATORY_CARE_PROVIDER_SITE_OTHER): Payer: Medicare Other | Admitting: *Deleted

## 2015-11-29 ENCOUNTER — Encounter: Payer: Self-pay | Admitting: *Deleted

## 2015-11-29 DIAGNOSIS — I480 Paroxysmal atrial fibrillation: Secondary | ICD-10-CM

## 2015-11-29 DIAGNOSIS — Z01818 Encounter for other preprocedural examination: Secondary | ICD-10-CM | POA: Diagnosis not present

## 2015-11-29 DIAGNOSIS — Z79899 Other long term (current) drug therapy: Secondary | ICD-10-CM

## 2015-11-29 LAB — CBC
HEMATOCRIT: 40.2 % (ref 38.5–50.0)
Hemoglobin: 13.4 g/dL (ref 13.2–17.1)
MCH: 30.2 pg (ref 27.0–33.0)
MCHC: 33.3 g/dL (ref 32.0–36.0)
MCV: 90.7 fL (ref 80.0–100.0)
MPV: 9.8 fL (ref 7.5–12.5)
Platelets: 250 10*3/uL (ref 140–400)
RBC: 4.43 MIL/uL (ref 4.20–5.80)
RDW: 14.3 % (ref 11.0–15.0)
WBC: 7.9 10*3/uL (ref 3.8–10.8)

## 2015-11-29 NOTE — Telephone Encounter (Signed)
Scheduled pt for out pt cardioversion 7/5 at 10 am.  He is to arrive at 8:30.  Needs to come in the office for lab work today (BMP/CBC).  Case # [id number].  Left message to c/b for instructions, to schedule lab and go over date and time.

## 2015-11-29 NOTE — Telephone Encounter (Signed)
Reviewed information with pt. He will come today for lab work and to pick up instructions.

## 2015-11-30 LAB — BASIC METABOLIC PANEL
BUN: 12 mg/dL (ref 7–25)
CALCIUM: 9.4 mg/dL (ref 8.6–10.3)
CO2: 20 mmol/L (ref 20–31)
Chloride: 105 mmol/L (ref 98–110)
Creat: 0.81 mg/dL (ref 0.70–1.18)
Glucose, Bld: 200 mg/dL — ABNORMAL HIGH (ref 65–99)
POTASSIUM: 3.8 mmol/L (ref 3.5–5.3)
SODIUM: 139 mmol/L (ref 135–146)

## 2015-12-02 ENCOUNTER — Other Ambulatory Visit: Payer: Self-pay | Admitting: Cardiology

## 2015-12-02 ENCOUNTER — Telehealth: Payer: Self-pay | Admitting: Cardiology

## 2015-12-02 NOTE — Telephone Encounter (Signed)
-----   Message from Lavena Bullion, RN sent at 12/02/2015  1:11 PM EDT ----- Patient needs orders for cardioversion on 7.5.21. Dr. Sallyanne Kuster is procedural MD. Thanks.

## 2015-12-04 ENCOUNTER — Encounter (HOSPITAL_COMMUNITY)
Admission: RE | Disposition: A | Discharge status: Home / Self Care | Payer: Self-pay | Source: Ambulatory Visit | Attending: Cardiovascular Disease

## 2015-12-04 ENCOUNTER — Encounter (HOSPITAL_COMMUNITY): Payer: Self-pay

## 2015-12-04 ENCOUNTER — Ambulatory Visit (HOSPITAL_COMMUNITY): Payer: Medicare Other | Admitting: Anesthesiology

## 2015-12-04 ENCOUNTER — Ambulatory Visit (HOSPITAL_COMMUNITY)
Admission: RE | Admit: 2015-12-04 | Discharge: 2015-12-04 | Disposition: A | Discharge status: Home / Self Care | Payer: Medicare Other | Source: Ambulatory Visit | Attending: Cardiovascular Disease | Admitting: Cardiovascular Disease

## 2015-12-04 DIAGNOSIS — Z79899 Other long term (current) drug therapy: Secondary | ICD-10-CM | POA: Diagnosis not present

## 2015-12-04 DIAGNOSIS — I10 Essential (primary) hypertension: Secondary | ICD-10-CM | POA: Insufficient documentation

## 2015-12-04 DIAGNOSIS — I739 Peripheral vascular disease, unspecified: Secondary | ICD-10-CM | POA: Diagnosis not present

## 2015-12-04 DIAGNOSIS — Z7901 Long term (current) use of anticoagulants: Secondary | ICD-10-CM | POA: Insufficient documentation

## 2015-12-04 DIAGNOSIS — E785 Hyperlipidemia, unspecified: Secondary | ICD-10-CM | POA: Diagnosis not present

## 2015-12-04 DIAGNOSIS — I2510 Atherosclerotic heart disease of native coronary artery without angina pectoris: Secondary | ICD-10-CM | POA: Diagnosis not present

## 2015-12-04 DIAGNOSIS — I481 Persistent atrial fibrillation: Secondary | ICD-10-CM | POA: Insufficient documentation

## 2015-12-04 DIAGNOSIS — I4819 Other persistent atrial fibrillation: Secondary | ICD-10-CM | POA: Insufficient documentation

## 2015-12-04 DIAGNOSIS — E119 Type 2 diabetes mellitus without complications: Secondary | ICD-10-CM | POA: Diagnosis not present

## 2015-12-04 DIAGNOSIS — Z7951 Long term (current) use of inhaled steroids: Secondary | ICD-10-CM | POA: Diagnosis not present

## 2015-12-04 DIAGNOSIS — Z8546 Personal history of malignant neoplasm of prostate: Secondary | ICD-10-CM | POA: Insufficient documentation

## 2015-12-04 DIAGNOSIS — I4891 Unspecified atrial fibrillation: Secondary | ICD-10-CM | POA: Diagnosis not present

## 2015-12-04 DIAGNOSIS — R001 Bradycardia, unspecified: Secondary | ICD-10-CM | POA: Diagnosis not present

## 2015-12-04 DIAGNOSIS — J449 Chronic obstructive pulmonary disease, unspecified: Secondary | ICD-10-CM | POA: Diagnosis not present

## 2015-12-04 HISTORY — PX: CARDIOVERSION: SHX1299

## 2015-12-04 LAB — GLUCOSE, CAPILLARY: Glucose-Capillary: 140 mg/dL — ABNORMAL HIGH (ref 65–99)

## 2015-12-04 SURGERY — CARDIOVERSION
Anesthesia: General

## 2015-12-04 MED ORDER — HYDROMORPHONE HCL 1 MG/ML IJ SOLN: 0.2500 mg | INTRAMUSCULAR | Status: DC | PRN

## 2015-12-04 MED ORDER — SODIUM CHLORIDE 0.9 % IV SOLN
INTRAVENOUS | Status: DC | PRN
  Administered 2015-12-04: 09:00:00 via INTRAVENOUS

## 2015-12-04 MED ORDER — PROPOFOL 10 MG/ML IV BOLUS
INTRAVENOUS | Status: DC | PRN
  Administered 2015-12-04: 80 mg via INTRAVENOUS

## 2015-12-04 MED ORDER — PROMETHAZINE HCL 25 MG/ML IJ SOLN: 6.2500 mg | INTRAMUSCULAR | Status: DC | PRN

## 2015-12-04 MED ORDER — METOPROLOL TARTRATE 25 MG PO TABS: 12.5000 mg | ORAL_TABLET | Freq: Two times a day (BID) | ORAL | Status: DC

## 2015-12-04 MED ORDER — LIDOCAINE HCL (CARDIAC) 20 MG/ML IV SOLN
INTRAVENOUS | Status: DC | PRN
  Administered 2015-12-04: 100 mg via INTRATRACHEAL

## 2015-12-04 NOTE — Anesthesia Preprocedure Evaluation (Addendum)
Anesthesia Evaluation  Patient identified by MRN, date of birth, ID band Patient awake    Reviewed: Allergy & Precautions, NPO status , Patient's Chart, lab work & pertinent test results  History of Anesthesia Complications Negative for: history of anesthetic complications  Airway Mallampati: II  TM Distance: >3 FB Neck ROM: Full    Dental  (+) Dental Advisory Given, Upper Dentures, Partial Lower   Pulmonary COPD, Current Smoker,    Pulmonary exam normal        Cardiovascular hypertension, + CAD   Rhythm:Irregular Rate:Normal     Neuro/Psych negative neurological ROS  negative psych ROS   GI/Hepatic Neg liver ROS, hiatal hernia, GERD  ,  Endo/Other  diabetes  Renal/GU negative Renal ROS     Musculoskeletal   Abdominal   Peds  Hematology   Anesthesia Other Findings   Reproductive/Obstetrics                           Anesthesia Physical Anesthesia Plan  ASA: III  Anesthesia Plan: General   Post-op Pain Management:    Induction:   Airway Management Planned: Mask and Natural Airway  Additional Equipment:   Intra-op Plan:   Post-operative Plan:   Informed Consent: I have reviewed the patients History and Physical, chart, labs and discussed the procedure including the risks, benefits and alternatives for the proposed anesthesia with the patient or authorized representative who has indicated his/her understanding and acceptance.   Dental advisory given and Dental Advisory Given  Plan Discussed with: CRNA and Anesthesiologist  Anesthesia Plan Comments:        Anesthesia Quick Evaluation

## 2015-12-04 NOTE — Discharge Instructions (Signed)
Electrical Cardioversion, Care After °Refer to this sheet in the next few weeks. These instructions provide you with information on caring for yourself after your procedure. Your health care provider may also give you more specific instructions. Your treatment has been planned according to current medical practices, but problems sometimes occur. Call your health care provider if you have any problems or questions after your procedure. °WHAT TO EXPECT AFTER THE PROCEDURE °After your procedure, it is typical to have the following sensations: °· Some redness on the skin where the shocks were delivered. If this is tender, a sunburn lotion or hydrocortisone cream may help. °· Possible return of an abnormal heart rhythm within hours or days after the procedure. °HOME CARE INSTRUCTIONS °· Take medicines only as directed by your health care provider. Be sure you understand how and when to take your medicine. °· Learn how to feel your pulse and check it often. °· Limit your activity for 48 hours after the procedure or as directed by your health care provider. °· Avoid or minimize caffeine and other stimulants as directed by your health care provider. °SEEK MEDICAL CARE IF: °· You feel like your heart is beating too fast or your pulse is not regular. °· You have any questions about your medicines. °· You have bleeding that will not stop. °SEEK IMMEDIATE MEDICAL CARE IF: °· You are dizzy or feel faint. °· It is hard to breathe or you feel short of breath. °· There is a change in discomfort in your chest. °· Your speech is slurred or you have trouble moving an arm or leg on one side of your body. °· You get a serious muscle cramp that does not go away. °· Your fingers or toes turn cold or blue. °  °This information is not intended to replace advice given to you by your health care provider. Make sure you discuss any questions you have with your health care provider. °  °Document Released: 03/08/2013 Document Revised: 06/08/2014  Document Reviewed: 03/08/2013 °Elsevier Interactive Patient Education ©2016 [employer] Inc[employer] ° °

## 2015-12-04 NOTE — Anesthesia Postprocedure Evaluation (Addendum)
Anesthesia Post Note  Patient: Vincent Velez  Procedure(s) Performed: Procedure(s) (LRB): CARDIOVERSION (N/A)  Patient location during evaluation: Endoscopy Anesthesia Type: General Level of consciousness: sedated Pain management: pain level controlled Vital Signs Assessment: post-procedure vital signs reviewed and stable Respiratory status: spontaneous breathing and respiratory function stable Cardiovascular status: stable Anesthetic complications: no    Last Vitals:  Filed Vitals:   12/04/15 0945 12/04/15 0950  BP: 64/45 91/54  Pulse: 49 52  Temp:    Resp: 12 15    Last Pain: There were no vitals filed for this visit.               Ceairra Mccarver DANIEL

## 2015-12-04 NOTE — Addendum Note (Signed)
Addendum  created 12/04/15 1030 by Duane Boston, MD   Modules edited: Clinical Notes   Clinical Notes:  File: [id number]

## 2015-12-04 NOTE — Transfer of Care (Signed)
Immediate Anesthesia Transfer of Care Note  Patient: Vincent Velez  Procedure(s) Performed: Procedure(s): CARDIOVERSION (N/A)  Patient Location: Endoscopy Unit  Anesthesia Type:General  Level of Consciousness: awake, alert  and oriented  Airway & Oxygen Therapy: Patient Spontanous Breathing and Patient connected to nasal cannula oxygen  Post-op Assessment: Report given to RN, Post -op Vital signs reviewed and stable and Patient moving all extremities X 4  Post vital signs: Reviewed and stable  Last Vitals:  Filed Vitals:   12/04/15 0851  BP: 103/79  Pulse: 77  Temp: 36.7 C  Resp: 16    Last Pain: There were no vitals filed for this visit.       Complications: No apparent anesthesia complications

## 2015-12-04 NOTE — Op Note (Signed)
Procedure: Electrical Cardioversion Indications:  Atrial Fibrillation  Procedure Details:  Consent: Risks of procedure as well as the alternatives and risks of each were explained to the (patient/caregiver).  Consent for procedure obtained.  Time Out: Verified patient identification, verified procedure, site/side was marked, verified correct patient position, special equipment/implants available, medications/allergies/relevent history reviewed, required imaging and test results available.  Performed  Patient placed on cardiac monitor, pulse oximetry, supplemental oxygen as necessary.  Sedation given: propofol 80 mg IV, Dr. Tobias Alexander Pacer pads placed anterior and posterior chest.  Cardioverted 1 time(s).  Cardioversion with synchronized biphasic 120J shock.  Evaluation: Findings: Post procedure EKG shows: sinus bradycardia 49 bpm Complications: None Patient did tolerate procedure well.  Time Spent Directly with the Patient:  30 minutes   Raechel Marcos 12/04/2015, 9:25 AM

## 2015-12-04 NOTE — H&P (Signed)
Chief Complaint:  atrial fibrillation   HPI:  This is a 75 y.o. male with a past medical history significant for persistent atrial fibrillation, moderate AS, moderate size AAA s/p EVAR, HTN presents for cardioversion. He has been compliant with Xarelto for > 3 weeks, no bleeding or neurological events.   Atrial fibrillation is associated with fatigue and "wobbly" gait.  PMHx:  Past Medical History  Diagnosis Date  . Allergy, unspecified not elsewhere classified   . Arthropathy, unspecified, site unspecified   . Malignant neoplasm of prostate ([place])   . Family history of malignant neoplasm of gastrointestinal tract   . Esophageal reflux   . Stricture and stenosis of esophagus   . Hiatal hernia   . Hypertension   . Hyperlipemia   . Diabetes mellitus   . COPD (chronic obstructive pulmonary disease) ([place])   . CAD (coronary artery disease)   . Peripheral vascular disease [facility])     Past Surgical History  Procedure Laterality Date  . Insertion prostate radiation seed    . Abdominal aorta stent      FAMHx:  Family History  Problem Relation Age of Onset  . Colon cancer Father   . Cancer Father     ? Colon  . Diabetes Mother   . Heart disease Mother     Heart Disease before age 45  . Deep vein thrombosis Mother   . Hyperlipidemia Mother   . Hypertension Mother   . Varicose Veins Mother   . Cancer Brother     Prostate  . Hypertension Brother   . Heart attack Brother     SOCHx:   reports that he has been smoking Cigars.  He has never used smokeless tobacco. He reports that he does not drink alcohol or use illicit drugs.  ALLERGIES:  No Known Allergies  ROS: Pertinent items are noted in HPI.  HOME MEDS: Medications Prior to Admission  Medication Sig Dispense Refill  . brinzolamide (AZOPT) 1 % ophthalmic suspension Place 1 drop into both eyes 3 (three) times daily.      Marland Kitchen esomeprazole (NEXIUM) 40 MG capsule Take 40 mg by mouth daily at 12 noon.    .  Fluticasone-Salmeterol (ADVAIR DISKUS) 100-50 MCG/DOSE AEPB Inhale 1 puff into the lungs 2 (two) times daily.    Marland Kitchen glimepiride (AMARYL) 1 MG tablet Take 1 mg by mouth daily before breakfast.      . metoprolol tartrate (LOPRESSOR) 25 MG tablet TAKE 1 TABLET (25 MG TOTAL) BY MOUTH 2 (TWO) TIMES DAILY. 60 tablet 11  . Multiple Vitamin (MULTIVITAMIN) capsule Take 1 capsule by mouth daily.      . Omega-3 Fatty Acids (FISH OIL) 1000 MG CAPS Take 1 capsule by mouth daily.      . rivaroxaban (XARELTO) 20 MG TABS tablet Take 1 tablet (20 mg total) by mouth daily with supper. 90 tablet 3  . simvastatin (ZOCOR) 40 MG tablet Take 40 mg by mouth at bedtime.        LABS/IMAGING: Results for orders placed or performed during the hospital encounter of 12/04/15 (from the past 48 hour(s))  Glucose, capillary     Status: Abnormal   Collection Time: 12/04/15  8:58 AM  Result Value Ref Range   Glucose-Capillary 140 (H) 65 - 99 mg/dL   No results found.  VITALS: Blood pressure 103/79, pulse 77, temperature 98 F (36.7 C), temperature source Oral, resp. rate 16, height '5\' 9"'$  (1.753 m), weight 86.183 kg (190 lb), SpO2 100 %.  EXAM:  General: Alert, oriented x3, no distress Head: no evidence of trauma, PERRL, EOMI, no exophtalmos or lid lag, no myxedema, no xanthelasma; normal ears, nose and oropharynx Neck: normal jugular venous pulsations and no hepatojugular reflux; brisk carotid pulses without delay and no carotid bruits Chest: clear to auscultation, no signs of consolidation by percussion or palpation, normal fremitus, symmetrical and full respiratory excursions Cardiovascular: normal position and quality of the apical impulse, irregular rhythm, normal first heart sound and normal second heart sounds, no rubs or gallops, 3/6 AS murmur Abdomen: no tenderness or distention, no masses by palpation, no abnormal pulsatility or arterial bruits, normal bowel sounds, no hepatosplenomegaly Extremities: no clubbing,  cyanosis or edema; 2+ radial, ulnar and brachial pulses bilaterally; 2+ right femoral, posterior tibial and dorsalis pedis pulses; 2+ left femoral, posterior tibial and dorsalis pedis pulses; no subclavian or femoral bruits Neurological: grossly nonfocal   IMPRESSION: Symptomatic persistent atrial fibrillation.  PLAN: Proceed with elective DCCV. This procedure has been fully reviewed with the patient and written informed consent has been obtained.   Sanda Klein, MD, [facility] [phone] office [phone] pager  12/04/2015, 9:28 AM

## 2015-12-05 ENCOUNTER — Encounter (HOSPITAL_COMMUNITY): Payer: Self-pay | Admitting: Cardiovascular Disease

## 2015-12-17 ENCOUNTER — Encounter: Payer: Self-pay | Admitting: Nurse Practitioner

## 2015-12-17 ENCOUNTER — Ambulatory Visit (INDEPENDENT_AMBULATORY_CARE_PROVIDER_SITE_OTHER): Payer: Medicare Other | Admitting: Nurse Practitioner

## 2015-12-17 VITALS — BP 104/68 | HR 78 | Ht 69.0 in | Wt 187.4 lb

## 2015-12-17 DIAGNOSIS — I482 Chronic atrial fibrillation, unspecified: Secondary | ICD-10-CM

## 2015-12-17 DIAGNOSIS — I350 Nonrheumatic aortic (valve) stenosis: Secondary | ICD-10-CM | POA: Diagnosis not present

## 2015-12-17 DIAGNOSIS — R531 Weakness: Secondary | ICD-10-CM

## 2015-12-17 DIAGNOSIS — Z79899 Other long term (current) drug therapy: Secondary | ICD-10-CM | POA: Diagnosis not present

## 2015-12-17 NOTE — Patient Instructions (Addendum)
We will be checking the following labs today - NONE   Medication Instructions:    Continue with your current medicines.     Testing/Procedures To Be Arranged:  Lexiscan Myoview  Follow-Up:   See Dr. Marlou Porch back for discussion about what medicine to go on following the results of your stress test.     Other Special Instructions:   N/A    If you need a refill on your cardiac medications before your next appointment, please call your pharmacy.   Call the [place] office at [phone] if you have any questions, problems or concerns.   Pharmacologic Stress Electrocardiogram A pharmacologic stress electrocardiogram is a heart (cardiac) test that uses nuclear imaging to evaluate the blood supply to your heart. This test may also be called a pharmacologic stress electrocardiography. Pharmacologic means that a medicine is used to increase your heart rate and blood pressure.  This stress test is done to find areas of poor blood flow to the heart by determining the extent of coronary artery disease (CAD). Some people exercise on a treadmill, which naturally increases the blood flow to the heart. For those people unable to exercise on a treadmill, a medicine is used. This medicine stimulates your heart and will cause your heart to beat harder and more quickly, as if you were exercising.  Pharmacologic stress tests can help determine:  The adequacy of blood flow to your heart during increased levels of activity in order to clear you for discharge home.  The extent of coronary artery blockage caused by CAD.  Your prognosis if you have suffered a heart attack.  The effectiveness of cardiac procedures done, such as an angioplasty, which can increase the circulation in your coronary arteries.  Causes of chest pain or pressure. LET [facility] CARE PROVIDER KNOW ABOUT:  Any allergies you have.  All medicines you are taking, including vitamins, herbs, eye  drops, creams, and over-the-counter medicines.  Previous problems you or members of your family have had with the use of anesthetics.  Any blood disorders you have.  Previous surgeries you have had.  Medical conditions you have.  Possibility of pregnancy, if this applies.  If you are currently breastfeeding. RISKS AND COMPLICATIONS Generally, this is a safe procedure. However, as with any procedure, complications can occur. Possible complications include:  You develop pain or pressure in the following areas:  Chest.  Jaw or neck.  Between your shoulder blades.  Radiating down your left arm.  Headache.  Dizziness or light-headedness.  Shortness of breath.  Increased or irregular heartbeat.  Low blood pressure.  Nausea or vomiting.  Flushing.  Redness going up the arm and slight pain during injection of medicine.  Heart attack (rare). BEFORE THE PROCEDURE   Avoid all forms of caffeine for 24 hours before your test or as directed by your health care provider. This includes coffee, tea (even decaffeinated tea), caffeinated sodas, chocolate, cocoa, and certain pain medicines.  Follow your health care provider's instructions regarding eating and drinking before the test.  Take your medicines as directed at regular times with water unless instructed otherwise. Exceptions may include:  If you have diabetes, ask how you are to take your insulin or pills. It is common to adjust insulin dosing the morning of the test.  If you are taking beta-blocker medicines, it is important to talk to your health care provider about these medicines well before the date of your test. Taking beta-blocker medicines may interfere  with the test. In some cases, these medicines need to be changed or stopped 24 hours or more before the test.  If you wear a nitroglycerin patch, it may need to be removed prior to the test. Ask your health care provider if the patch should be removed before the  test.  If you use an inhaler for any breathing condition, bring it with you to the test.  If you are an outpatient, bring a snack so you can eat right after the stress phase of the test.  Do not smoke for 4 hours prior to the test or as directed by your health care provider.  Do not apply lotions, powders, creams, or oils on your chest prior to the test.  Wear comfortable shoes and clothing. Let your health care provider know if you were unable to complete or follow the preparations for your test. PROCEDURE   Multiple patches (electrodes) will be put on your chest. If needed, small areas of your chest may be shaved to get better contact with the electrodes. Once the electrodes are attached to your body, multiple wires will be attached to the electrodes, and your heart rate will be monitored.  An IV access will be started. A nuclear trace (isotope) is given. The isotope may be given intravenously, or it may be swallowed. Nuclear refers to several types of radioactive isotopes, and the nuclear isotope lights up the arteries so that the nuclear images are clear. The isotope is absorbed by your body. This results in low radiation exposure.  A resting nuclear image is taken to show how your heart functions at rest.  A medicine is given through the IV access.  A second scan is done about 1 hour after the medicine injection and determines how your heart functions under stress.  During this stress phase, you will be connected to an electrocardiogram machine. Your blood pressure and oxygen levels will be monitored. AFTER THE PROCEDURE   Your heart rate and blood pressure will be monitored after the test.  You may return to your normal schedule, including diet,activities, and medicines, unless your health care provider tells you otherwise.   This information is not intended to replace advice given to you by your health care provider. Make sure you discuss any questions you have with your health  care provider.   Document Released: 10/04/2008 Document Revised: 05/23/2013 Document Reviewed: 01/23/2013 Elsevier Interactive Patient Education [employer].

## 2015-12-17 NOTE — Progress Notes (Signed)
CARDIOLOGY OFFICE NOTE  Date:  12/17/2015    Vincent Velez Date of Birth: 10/13/1940 Medical Record #[id number]  PCP:  Kandice Hams, MD  Cardiologist:  Marisa Cyphers    Chief Complaint  Patient presents with  . Atrial Fibrillation    Post cardioversion - seen for Dr. Marlou Porch    History of Present Illness: Vincent Velez is a 75 y.o. male who presents today for a post cardioversion visit. Seen for Dr. Marlou Porch.   He has a history of moderate AS, AAA which has been treated with stent graft in 2011 by Dr. Oneida Alar. Stress test from 2010 showed a small area of possible ischemia in the apical lateral segment - opted for medical management.   Found to have AF back earlier this year. Last seen here in May and was feeling weak, dizzy and wobbly. Had been placed on Eliquis back in April by PCP but ran out of samples.   Presented for cardioversion earlier this month after 3 weeks of Xarelto therapy. Was converted to sinus bradycardia with a rate of 49.   Comes back today. Here alone today. Already back out of rhythm - he thought he might already be back out. He notes he had a "short lived" time that he did feel better - maybe for just a day or so - but clearly felt better. No real chest pain. No syncope. No real palpitations but now weak and still dizzy. Continues to smoke some cigars. Notes his legs get wobbly at times.   Past Medical History  Diagnosis Date  . Allergy, unspecified not elsewhere classified   . Arthropathy, unspecified, site unspecified   . Malignant neoplasm of prostate ([place])   . Family history of malignant neoplasm of gastrointestinal tract   . Esophageal reflux   . Stricture and stenosis of esophagus   . Hiatal hernia   . Hypertension   . Hyperlipemia   . Diabetes mellitus   . COPD (chronic obstructive pulmonary disease) ([place])   . CAD (coronary artery disease)   . Peripheral vascular disease [facility])     Past Surgical History  Procedure Laterality  Date  . Insertion prostate radiation seed    . Abdominal aorta stent    . Cardioversion N/A 12/04/2015    Procedure: CARDIOVERSION;  Surgeon: Sanda Klein, MD;  Location: [facility];  Service: Cardiovascular;  Laterality: N/A;     Medications: Current Outpatient Prescriptions  Medication Sig Dispense Refill  . brinzolamide (AZOPT) 1 % ophthalmic suspension Place 1 drop into both eyes 3 (three) times daily.      Marland Kitchen esomeprazole (NEXIUM) 40 MG capsule Take 40 mg by mouth daily at 12 noon.    Marland Kitchen glimepiride (AMARYL) 1 MG tablet Take 1 mg by mouth daily before breakfast.      . metoprolol tartrate (LOPRESSOR) 25 MG tablet Take 0.5 tablets (12.5 mg total) by mouth 2 (two) times daily. 60 tablet 11  . rivaroxaban (XARELTO) 20 MG TABS tablet Take 1 tablet (20 mg total) by mouth daily with supper. 90 tablet 3  . simvastatin (ZOCOR) 40 MG tablet Take 40 mg by mouth at bedtime.       No current facility-administered medications for this visit.    Allergies: No Known Allergies  Social History: The patient  reports that he has been smoking Cigars.  He has never used smokeless tobacco. He reports that he does not drink alcohol or use illicit drugs.   Family History: The patient's  family history includes Cancer in his brother and father; Colon cancer in his father; Deep vein thrombosis in his mother; Diabetes in his mother; Heart attack in his brother; Heart disease in his mother; Hyperlipidemia in his mother; Hypertension in his brother and mother; Varicose Veins in his mother.   Review of Systems: Please see the history of present illness.   Otherwise, the review of systems is positive for labile blood sugars.   All other systems are reviewed and negative.   Physical Exam: VS:  BP 104/68 mmHg  Pulse 78  Ht '5\' 9"'$  (1.753 m)  Wt 187 lb 6.4 oz (85.004 kg)  BMI 27.66 kg/m2 .  BMI Body mass index is 27.66 kg/(m^2).  Wt Readings from Last 3 Encounters:  12/17/15 187 lb 6.4 oz (85.004 kg)    12/04/15 190 lb (86.183 kg)  10/21/15 182 lb 3.2 oz (82.645 kg)    General: Pleasant. Looks a little older than his stated age but alert and in no acute distress.  HEENT: Normal. Neck: Supple, no JVD, carotid bruits, or masses noted.  Cardiac: Irregular irregular rhythm. Rate ok. No murmurs, rubs, or gallops. No edema.  Respiratory:  Lungs are clear to auscultation bilaterally with normal work of breathing.  GI: Soft and nontender.  MS: No deformity or atrophy. Gait and ROM intact. Skin: Warm and dry. Color is normal.  Neuro:  Strength and sensation are intact and no gross focal deficits noted.  Psych: Alert, appropriate and with normal affect.   LABORATORY DATA:  EKG:  EKG is ordered today. This demonstrates recurrent atrial fib with a controlled VR of 77. He has anterior lateral ST and T wave changes.  Lab Results  Component Value Date   WBC 7.9 11/29/2015   HGB 13.4 11/29/2015   HCT 40.2 11/29/2015   PLT 250 11/29/2015   GLUCOSE 200* 11/29/2015   CHOL 120 09/15/2013   TRIG 110.0 09/15/2013   HDL 37.70* 09/15/2013   LDLCALC 60 09/15/2013   ALT 18 04/12/2009   AST 21 04/12/2009   NA 139 11/29/2015   K 3.8 11/29/2015   CL 105 11/29/2015   CREATININE 0.81 11/29/2015   BUN 12 11/29/2015   CO2 20 11/29/2015   INR 1.17 04/11/2009    BNP (last 3 results) No results for input(s): BNP in the last 8760 hours.  ProBNP (last 3 results) No results for input(s): PROBNP in the last 8760 hours.   Other Studies Reviewed Today:  Echo Study Conclusions from 09/2015  - Left ventricle: The cavity size was normal. Systolic function was  mildly reduced. The estimated ejection fraction was in the range  of 45% to 50%. Diffuse hypokinesis. - Aortic valve: Valve mobility was restricted. There was moderate  stenosis. There was moderate regurgitation. Peak velocity (S):  338 cm/s. Mean gradient (S): 25 mm Hg. - Left atrium: The atrium was moderately dilated. - Right ventricle:  The cavity size was mildly dilated. Wall  thickness was normal.  Impressions:  - Aortic stenosis has mildly advanced since prior study.  Assessment/Plan: 1. PAF - has reverted back to AF - he feels like he had a short period of feeling better right after his cardioversion. He would like to try and have NSR restored. I'm concerned about possible AAD options with having had prior abnormal Myoview. Will get her Myoview updated and get him seen back to see Dr. Marlou Porch for possible drug options. He is committed to anti coagulation. May need to see EP as well.  Further disposition to follow.   2. Moderate AS - echo from May noted.   3. Mild LV dysfunction - seems asymptomatic.   4. Prior AAA repair  5. DM  6. Chronic anticoagulation - no problems noted.   Current medicines are reviewed with the patient today.  The patient does not have concerns regarding medicines other than what has been noted above.  The following changes have been made:  See above.  Labs/ tests ordered today include:    Orders Placed This Encounter  Procedures  . Myocardial Perfusion Imaging  . EKG 12-Lead     Disposition:   FU with Dr. Marlou Porch following his Myoview for further discussion.  Patient is agreeable to this plan and will call if any problems develop in the interim.   Signed: Burtis Junes, RN, ANP-C 12/17/2015 2:56 PM  [facility] [address] [place], [place]  [address] Phone: [phone] Fax: [phone]

## 2015-12-26 ENCOUNTER — Telehealth (HOSPITAL_COMMUNITY): Payer: Self-pay | Admitting: *Deleted

## 2015-12-26 NOTE — Telephone Encounter (Signed)
Patient given detailed instructions per Myocardial Perfusion Study Information Sheet for the test on  12/31/15 Patient notified to arrive 15 minutes early and that it is imperative to arrive on time for appointment to keep from having the test rescheduled.  If you need to cancel or reschedule your appointment, please call the office within 24 hours of your appointment. Failure to do so may result in a cancellation of your appointment, and a $50 no show fee. Patient verbalized understanding.Hubbard Robinson, RN

## 2015-12-31 ENCOUNTER — Ambulatory Visit (HOSPITAL_COMMUNITY): Payer: Medicare Other | Attending: Cardiovascular Disease

## 2015-12-31 DIAGNOSIS — I119 Hypertensive heart disease without heart failure: Secondary | ICD-10-CM | POA: Diagnosis not present

## 2015-12-31 DIAGNOSIS — I482 Chronic atrial fibrillation, unspecified: Secondary | ICD-10-CM

## 2015-12-31 DIAGNOSIS — R9439 Abnormal result of other cardiovascular function study: Secondary | ICD-10-CM | POA: Insufficient documentation

## 2015-12-31 DIAGNOSIS — Z79899 Other long term (current) drug therapy: Secondary | ICD-10-CM | POA: Diagnosis not present

## 2015-12-31 DIAGNOSIS — I350 Nonrheumatic aortic (valve) stenosis: Secondary | ICD-10-CM

## 2015-12-31 DIAGNOSIS — R531 Weakness: Secondary | ICD-10-CM | POA: Diagnosis not present

## 2015-12-31 DIAGNOSIS — R42 Dizziness and giddiness: Secondary | ICD-10-CM | POA: Diagnosis not present

## 2015-12-31 LAB — MYOCARDIAL PERFUSION IMAGING
LV dias vol: 158 mL (ref 62–150)
LV sys vol: 97 mL
Peak HR: 96 {beats}/min
RATE: 0.11
Rest HR: 52 {beats}/min
SDS: 0
SRS: 6
SSS: 6
TID: 1.11

## 2015-12-31 MED ORDER — REGADENOSON 0.4 MG/5ML IV SOLN
0.4000 mg | Freq: Once | INTRAVENOUS | Status: AC
  Administered 2015-12-31: 0.4 mg via INTRAVENOUS

## 2015-12-31 MED ORDER — TECHNETIUM TC 99M TETROFOSMIN IV KIT
10.4000 | PACK | Freq: Once | INTRAVENOUS | Status: AC | PRN
  Administered 2015-12-31: 10 via INTRAVENOUS
  Filled 2015-12-31: qty 10

## 2015-12-31 MED ORDER — TECHNETIUM TC 99M TETROFOSMIN IV KIT
32.5000 | PACK | Freq: Once | INTRAVENOUS | Status: AC | PRN
  Administered 2015-12-31: 33 via INTRAVENOUS
  Filled 2015-12-31: qty 33

## 2016-01-08 ENCOUNTER — Encounter: Payer: Self-pay | Admitting: Cardiology

## 2016-01-23 ENCOUNTER — Encounter: Payer: Self-pay | Admitting: Cardiology

## 2016-01-23 ENCOUNTER — Ambulatory Visit (INDEPENDENT_AMBULATORY_CARE_PROVIDER_SITE_OTHER): Payer: Medicare Other | Admitting: Cardiology

## 2016-01-23 VITALS — BP 118/70 | HR 84 | Ht 69.0 in | Wt 188.0 lb

## 2016-01-23 DIAGNOSIS — Z79899 Other long term (current) drug therapy: Secondary | ICD-10-CM

## 2016-01-23 DIAGNOSIS — I482 Chronic atrial fibrillation, unspecified: Secondary | ICD-10-CM

## 2016-01-23 DIAGNOSIS — I714 Abdominal aortic aneurysm, without rupture, unspecified: Secondary | ICD-10-CM

## 2016-01-23 DIAGNOSIS — I350 Nonrheumatic aortic (valve) stenosis: Secondary | ICD-10-CM

## 2016-01-23 NOTE — Progress Notes (Signed)
[place]. [address], [address], [place]  [address] Phone: [phone] Fax:  [phone]  Date:  01/23/2016   ID:  Vincent Velez, DOB 04-14-1941, MRN [id number]  PCP:  Kandice Hams, MD   History of Present Illness: Vincent Velez is a 75 y.o. male with moderate aortic stenosis, abdominal aortic aneurysm, for follow up of AFIB, discovered April 2017 possibly beginning in January/February 2017 after a flight to [facility] to help his sister.  He seems to be feeling a little bit more energy after his cardioversion ended up failing a few days after. Low-dose metoprolol is adequately rate control him.   His blood pressure also remains under good control which is very important with AAA of 4.27 cm .  Stress test was performed in November 2010 showing a small area of possible ischemia in the apical lateral segment. We agreed on medical management and optimizing his risk factor modification. His LDL is excellent. He also has a history of stent graft to his abdominal aorta 2011 for which Dr. Oneida Alar follows. He has not been having any abdominal pain.   Quit smoking 07/2014. Allergies. Seems to be back to smoking cigars.   Wt Readings from Last 3 Encounters:  01/23/16 188 lb (85.3 kg)  12/31/15 187 lb (84.8 kg)  12/17/15 187 lb 6.4 oz (85 kg)     Past Medical History:  Diagnosis Date  . Allergy, unspecified not elsewhere classified   . Arthropathy, unspecified, site unspecified   . CAD (coronary artery disease)   . COPD (chronic obstructive pulmonary disease) ([place])   . Diabetes mellitus   . Esophageal reflux   . Family history of malignant neoplasm of gastrointestinal tract   . Hiatal hernia   . Hyperlipemia   . Hypertension   . Malignant neoplasm of prostate ([place])   . Peripheral vascular disease ([place])   . Stricture and stenosis of esophagus     Past Surgical History:  Procedure Laterality Date  . ABDOMINAL AORTA STENT    . CARDIOVERSION N/A 12/04/2015   Procedure:  CARDIOVERSION;  Surgeon: Sanda Klein, MD;  Location[facility];  Service: Cardiovascular;  Laterality: N/A;  . INSERTION PROSTATE RADIATION SEED      Current Outpatient Prescriptions  Medication Sig Dispense Refill  . brinzolamide (AZOPT) 1 % ophthalmic suspension Place 1 drop into both eyes 3 (three) times daily.      Marland Kitchen esomeprazole (NEXIUM) 40 MG capsule Take 40 mg by mouth daily at 12 noon.    Marland Kitchen glimepiride (AMARYL) 1 MG tablet Take 1 mg by mouth daily before breakfast.      . metoprolol tartrate (LOPRESSOR) 25 MG tablet Take 0.5 tablets (12.5 mg total) by mouth 2 (two) times daily. 60 tablet 11  . rivaroxaban (XARELTO) 20 MG TABS tablet Take 1 tablet (20 mg total) by mouth daily with supper. 90 tablet 3  . simvastatin (ZOCOR) 40 MG tablet Take 40 mg by mouth at bedtime.       No current facility-administered medications for this visit.     Allergies:   No Known Allergies  Social History:  The patient  reports that he has been smoking Cigars.  He has never used smokeless tobacco. He reports that he does not drink alcohol or use drugs.   ROS:  Please see the history of present illness.   Denies syncope, bleeding, or anemia  PHYSICAL EXAM: VS:  BP 118/70   Pulse 84   Ht 5'  9" (1.753 m)   Wt 188 lb (85.3 kg)   BMI 27.76 kg/m  Well nourished, well developed, in no acute distress  HEENT: normal  Neck: no JVD  Cardiac:  Irregularly irregular, normal rate; 3/6 SEM murmur  Lungs:  clear to auscultation bilaterally, no wheezing, rhonchi or rales  Abd: soft, nontender, no hepatomegaly  Ext: no edema  Skin: warm and dry  Neuro: no focal abnormalities noted  EKG:  EKG was ordered today. 10/21/15-atrial fibrillation heart rate 104 with rapid ventricular response,PVC noted, nonspecific ST-T wave changes. Personally viewed. 09/18/14-sinus bradycardia rate 58 with nonspecific ST-T wave changes-prior 09/15/13-Sinus rhythm, PVCs   ECHO 2015 -  1. Normal LV size and function. 2. Left  ventricular ejection fraction estimated by 2D at 60-65 percent. 3. There were no regional wall motion abnormalities. 4. Trace mitral valve regurgitation. 5. Trivial tricuspid regurgitation. 6. Moderate calcification of the aortic valve. 7. Moderate aortic valve stenosis. Peak velocity 2.77ms, mean gradient 182mg. Advanced from mild 2010. 8. Mild aortic valve regurgitation. 9. There is mild aortic root dilatation. 4.1cm ascending root. 10. Analysis of mitral valve inflow, pulmonary vein Doppler and tissue Doppler suggests grade I diastolic dysfunction without elevated left atrial pressure.  ECHO 10/18/15 - Left ventricle: The cavity size was normal. Systolic function was  mildly reduced. The estimated ejection fraction was in the range  of 45% to 50%. Diffuse hypokinesis. - Aortic valve: Valve mobility was restricted. There was moderate  stenosis. There was moderate regurgitation. Peak velocity (S):  338 cm/s. Mean gradient (S): 25 mm Hg. - Left atrium: The atrium was moderately dilated. - Right ventricle: The cavity size was mildly dilated. Wall  thickness was normal.  Impressions:  - Aortic stenosis has mildly advanced since prior study.  NUC Stress: 12/31/15:  Nuclear stress EF: 39%.  No T wave inversion was noted during stress.  There was no ST segment deviation noted during stress.  Defect 1: There is a small defect of moderate severity present in the apical lateral and apex location.  This is an intermediate risk study.   Small size, moderate intensity fixed apical/apiacal lateral perfusion defect - could be scar or artifact. Dilated LV with global hypokinesis. LVEF 39%. This is an intermediate risk study.   ASSESSMENT AND PLAN:  1. Atrial fibrillation - paroxysmal - possibly started 07/2015. Cardioversion was successful but he is back in atrial fibrillation. Nuclear stress test showed ejection fraction of 39% as above. Echocardiogram showed 45%. He states that  since he has been on the Xarelto and metoprolol, he is starting to feel more energy. We do not want to pursue further antiarrhythmics or procedures at this point. Be careful with chainsaw.  CHADS-VASc (1 diabetes, 1 age, 1 PVD (AAA)-3 total.  2. Bradycardia- low-dose beta blocker for rate control. Originally, atrial fibrillation was 104 bpm. Currently 84. Well-controlled. 3. Aortic stenosis-moderate severity. Echocardiogram showed peak velocity of 3.4 m/s, mean gradient of 25 mmHg, EF 45-50%. Mildly progressed. Has mild shortness of breath but no significant symptoms. No angina. 4. S/p AAA -4.27 cm Dr. FiOneida AlarDoing well. No complaints. 5. Hyperlipidemia-on statin. 4/15 - LDL 60 6. Tobacco cessation-great job on quitting February 2016. Cigars, pipe. 7. We will see him back in 6 months  Signed, MaCandee FurbishMD FAA[facility]8/24/2017 3:09 PM

## 2016-01-23 NOTE — Patient Instructions (Signed)

## 2016-03-16 DIAGNOSIS — E119 Type 2 diabetes mellitus without complications: Secondary | ICD-10-CM | POA: Diagnosis not present

## 2016-03-16 DIAGNOSIS — D2311 Other benign neoplasm of skin of right eyelid, including canthus: Secondary | ICD-10-CM | POA: Diagnosis not present

## 2016-03-16 DIAGNOSIS — H401122 Primary open-angle glaucoma, left eye, moderate stage: Secondary | ICD-10-CM | POA: Diagnosis not present

## 2016-03-16 DIAGNOSIS — H401112 Primary open-angle glaucoma, right eye, moderate stage: Secondary | ICD-10-CM | POA: Diagnosis not present

## 2016-03-24 ENCOUNTER — Encounter: Payer: Self-pay | Admitting: Family

## 2016-03-26 ENCOUNTER — Encounter: Payer: Self-pay | Admitting: Family

## 2016-03-26 ENCOUNTER — Ambulatory Visit (INDEPENDENT_AMBULATORY_CARE_PROVIDER_SITE_OTHER): Payer: Medicare Other | Admitting: Family

## 2016-03-26 ENCOUNTER — Ambulatory Visit (HOSPITAL_COMMUNITY)
Admission: RE | Admit: 2016-03-26 | Discharge: 2016-03-26 | Disposition: A | Discharge status: Home / Self Care | Payer: Medicare Other | Source: Ambulatory Visit | Attending: Family | Admitting: Family

## 2016-03-26 VITALS — BP 119/80 | HR 74 | Temp 97.0°F | Resp 18 | Ht 69.0 in | Wt 189.0 lb

## 2016-03-26 DIAGNOSIS — R0989 Other specified symptoms and signs involving the circulatory and respiratory systems: Secondary | ICD-10-CM | POA: Diagnosis not present

## 2016-03-26 DIAGNOSIS — I714 Abdominal aortic aneurysm, without rupture, unspecified: Secondary | ICD-10-CM

## 2016-03-26 DIAGNOSIS — Z48812 Encounter for surgical aftercare following surgery on the circulatory system: Secondary | ICD-10-CM

## 2016-03-26 DIAGNOSIS — Z95828 Presence of other vascular implants and grafts: Secondary | ICD-10-CM

## 2016-03-26 NOTE — Progress Notes (Addendum)
[facility]  CC: Follow up s/p EVAR  History of Present Illness  Vincent Velez is a 75 y.o. ([birth year]) male patient of Dr. 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 He returns today for routine follow up and also for duplex evaluation of prominent popliteal pulses.  The patient has not had back or abdominal pain. He denies claudication symptoms, denies non-healing wounds. He denies history of stroke or TIA symptoms. States he is more physically active lately. His son died 12/21/13 in a motorcycle accident, he is grieving this loss but is coping better. [name] is having post-operative back surgery issues re scoliosis and pt is stressed re this, he has resumed 1 cigar/day.   He states he has not mentioned to his PCP that he has had about 4 episodes of right or left upper quadrant abdominal pain that radiates around to his back. This first occurred about April 2016.  He sees Dr. Marlou Porch who monitors pt's mild CAD and cardiac murmur, per pt.  His medications include a daily statin. The ASA was stopped when he started the [place].  He has been on Xarelto since about June 2017 for atrial fib, cardioversion performed, was successful for a short time.  Pt Diabetic: Yes, states his last A1C was 6.8 Pt smoker: smokes 1 cigar/day   Past Medical History:  Diagnosis Date  . AAA (abdominal aortic aneurysm) ([place])   . Allergy, unspecified not elsewhere classified   . Arthropathy, unspecified, site unspecified   . CAD (coronary artery disease)   . COPD (chronic obstructive pulmonary disease) ([place])   . Diabetes mellitus   . Esophageal reflux   . Family history of malignant neoplasm of gastrointestinal tract   . Hiatal hernia   . Hyperlipemia   . Hypertension   . Malignant neoplasm of prostate ([place])   . Peripheral vascular disease ([place])   . Stricture and stenosis of esophagus    Past Surgical History:  Procedure Laterality Date   . ABDOMINAL AORTA STENT    . CARDIOVERSION N/A 12/04/2015   Procedure: CARDIOVERSION;  Surgeon: Sanda Klein, MD;  [facility];  Service: Cardiovascular;  Laterality: N/A;  . INSERTION PROSTATE RADIATION SEED     Social History Social History  Substance Use Topics  . Smoking status: Light Tobacco Smoker    Types: Cigars  . Smokeless tobacco: Never Used  . Alcohol use No   Family History Family History  Problem Relation Age of Onset  . Colon cancer Father   . Cancer Father     ? Colon  . Diabetes Mother   . Heart disease Mother     Heart Disease before age 54  . Deep vein thrombosis Mother   . Hyperlipidemia Mother   . Hypertension Mother   . Varicose Veins Mother   . Cancer Brother     Prostate  . Hypertension Brother   . Heart attack Brother    Current Outpatient Prescriptions on File Prior to Visit  Medication Sig Dispense Refill  . brinzolamide (AZOPT) 1 % ophthalmic suspension Place 1 drop into both eyes 3 (three) times daily.      Marland Kitchen esomeprazole (NEXIUM) 40 MG capsule Take 40 mg by mouth daily at 12 noon.    Marland Kitchen glimepiride (AMARYL) 1 MG tablet Take 1 mg by mouth daily before breakfast.      . metoprolol tartrate (LOPRESSOR) 25 MG tablet Take 0.5 tablets (12.5 mg total) by mouth 2 (  two) times daily. 60 tablet 11  . rivaroxaban (XARELTO) 20 MG TABS tablet Take 1 tablet (20 mg total) by mouth daily with supper. 90 tablet 3  . simvastatin (ZOCOR) 40 MG tablet Take 40 mg by mouth at bedtime.       No current facility-administered medications on file prior to visit.    No Known Allergies   ROS: See HPI for pertinent positives and negatives.  Physical Examination  Vitals:   03/26/16 0837  BP: 119/80  Pulse: 74  Resp: 18  Temp: 97 F (36.1 C)  SpO2: 97%  Weight: 189 lb (85.7 kg)  Height: '5\' 9"'$  (1.753 m)   Body mass index is 27.91 kg/m.  General: A&O x 3, WD.  Pulmonary: Sym exp, respirations are non labored, good air movt, CTAB, no rales,  rhonchi, & wheezing.  Cardiac: RRR, Nl S1, S2, + murmur.  Vascular: Vessel Right Left  Radial 2+Palpable 2+Palpable  Carotid without bruit without bruit  Aorta Not palpable N/A  Femoral Palpable Palpable  Popliteal Not  palpable not palpable  PT Not Palpable Not Palpable  DP Faintly Palpable 1+Palpable   Gastrointestinal: soft, NTND, -G/R, - HSM, - palpable masses, - CVAT B.  Musculoskeletal: M/S 5/5 throughout, Extremities without ischemic changes.  Neurologic: Pain and light touch intact in extremities, Motor exam as listed above    CTA Abd/Pelvis Duplex (Date: 05/08/10) Slight interval decrease in size of the aneurysm sac post stent graft repair of the AAA.  Maximum diameter currently 4.8 cm.  No evidence of endoleak.    Non-Invasive Vascular Imaging  EVAR Duplex (Date: 03/26/16)  AAA sac size: 4.4 cm x 4.2 cm; Right CIA: 1.7 cm; Left CIA: 1.8 cm  no endoleak detected 03/21/15: 4.27 cm x 4.18 cm    Medical Decision Making  Vincent Velez is a 75 y.o. male who presents s/p EVAR (November 2010).  He also had prominent popliteal pulses at a previous visit; however, duplex of bilateral popliteal arteries on 03/21/15 demonstrated no popliteal artery aneurysms. EVAR duplex today demonstrates a stable sac size compared to 03/21/15, normal diameter of iliac arteries. I advised pt to mention to his PCP that he has had about 4 episodes of right or left upper quadrant abdominal pain that radiates around to his back. This first occurred about April 2016. He also reports a transient visual change in both eyes after standing up from a bent forward position; the description was not that of amaurosis fugax. He had seen his ophthalmologist 2 weeks prior.   Based on this patient's exam and diagnostic studies, the patient will follow up in 1 year  with the following studies: EVAR duplex.   I discussed  with the patient the importance of surveillance of the endograft.  The next endograft duplex will be scheduled for 12 months.  The patient will follow up with [place] in 12 months with these studies.  I emphasized the importance of maximal medical management including strict control of blood pressure, blood glucose, and lipid levels, antiplatelet agents, obtaining regular exercise, and cessation of smoking.   Thank you for allowing [place] to participate in this patient's care.  Clemon Chambers, RN, MSN, FNP-C [facility] Office: [phone]  Clinic Physician: Oneida Alar  03/26/2016, 8:48 AM

## 2016-04-12 ENCOUNTER — Other Ambulatory Visit: Payer: Self-pay | Admitting: Cardiology

## 2016-04-13 NOTE — Telephone Encounter (Signed)
10/21/2015 11:14 AM After Visit Summary Printed Shellia Cleverly, RN  Patient Instructions   Medication Instructions:  Please stop your ASA and Lisinopril. Start Xarelto 20 mg a day and Metoprolol tartrate 25 mg twice a day. Continue all other medications as listed.

## 2016-04-15 ENCOUNTER — Encounter: Payer: Self-pay | Admitting: Gastroenterology

## 2016-04-15 DIAGNOSIS — I10 Essential (primary) hypertension: Secondary | ICD-10-CM | POA: Diagnosis not present

## 2016-04-15 DIAGNOSIS — E7800 Pure hypercholesterolemia, unspecified: Secondary | ICD-10-CM | POA: Diagnosis not present

## 2016-04-15 DIAGNOSIS — E139 Other specified diabetes mellitus without complications: Secondary | ICD-10-CM | POA: Diagnosis not present

## 2016-04-15 DIAGNOSIS — I359 Nonrheumatic aortic valve disorder, unspecified: Secondary | ICD-10-CM | POA: Diagnosis not present

## 2016-04-15 DIAGNOSIS — Z0000 Encounter for general adult medical examination without abnormal findings: Secondary | ICD-10-CM | POA: Diagnosis not present

## 2016-04-15 DIAGNOSIS — Z125 Encounter for screening for malignant neoplasm of prostate: Secondary | ICD-10-CM | POA: Diagnosis not present

## 2016-04-15 DIAGNOSIS — Z1389 Encounter for screening for other disorder: Secondary | ICD-10-CM | POA: Diagnosis not present

## 2016-04-15 DIAGNOSIS — I480 Paroxysmal atrial fibrillation: Secondary | ICD-10-CM | POA: Diagnosis not present

## 2016-04-15 DIAGNOSIS — F419 Anxiety disorder, unspecified: Secondary | ICD-10-CM | POA: Diagnosis not present

## 2016-04-15 DIAGNOSIS — Z23 Encounter for immunization: Secondary | ICD-10-CM | POA: Diagnosis not present

## 2016-04-15 DIAGNOSIS — I714 Abdominal aortic aneurysm, without rupture: Secondary | ICD-10-CM | POA: Diagnosis not present

## 2016-04-21 NOTE — Addendum Note (Signed)
Addended by: Lianne Cure A on: 04/21/2016 03:08 PM   Modules accepted: Orders

## 2016-04-30 ENCOUNTER — Other Ambulatory Visit: Payer: Self-pay

## 2016-05-04 ENCOUNTER — Encounter: Payer: Self-pay | Admitting: Gastroenterology

## 2016-05-04 ENCOUNTER — Telehealth: Payer: Self-pay

## 2016-05-04 ENCOUNTER — Ambulatory Visit (INDEPENDENT_AMBULATORY_CARE_PROVIDER_SITE_OTHER): Payer: Medicare Other | Admitting: Gastroenterology

## 2016-05-04 ENCOUNTER — Encounter (INDEPENDENT_AMBULATORY_CARE_PROVIDER_SITE_OTHER): Payer: Self-pay

## 2016-05-04 VITALS — BP 110/74 | HR 74 | Ht 70.0 in | Wt 188.0 lb

## 2016-05-04 DIAGNOSIS — Z1211 Encounter for screening for malignant neoplasm of colon: Secondary | ICD-10-CM | POA: Diagnosis not present

## 2016-05-04 DIAGNOSIS — R1011 Right upper quadrant pain: Secondary | ICD-10-CM | POA: Diagnosis not present

## 2016-05-04 DIAGNOSIS — Z7901 Long term (current) use of anticoagulants: Secondary | ICD-10-CM | POA: Insufficient documentation

## 2016-05-04 MED ORDER — NA SULFATE-K SULFATE-MG SULF 17.5-3.13-1.6 GM/177ML PO SOLN: 1.0000 | Freq: Once | ORAL | 0 refills | Status: AC

## 2016-05-04 NOTE — Progress Notes (Signed)
05/04/2016 Vincent Velez [id number] Aug 31, 1940   HISTORY OF PRESENT ILLNESS:  This is a pleasant 75 year old male who was previously known to Dr. Sharlett Iles for colonoscopy in 03/2005 at which time the study was normal.  He is here today to discuss another screening.  He is on Xarelto for PAF and follows with Dr. Delice Lesch for that.  He denies any rectal bleeding.  He moves his bowels regularly.    He does report some intermittent episodes of abdominal pain, says that they have occurred probably 5-6 times over the past year.  Usually seem to be on the right side but occurred in left upper quadrant once as well.  Says that the pains radiate to his back, into his chest, and shoulder.  Lasts about 15 minutes before completely subsiding, but says that it is very intense pain while it is occurring.  He thought that may it was bad gas pain since it seems to be in different spots.  There is some nausea when it occurs as well because the pain gets so intense.  Takes Nexium 40 mg daily.  CBC, CMP were WNL's in November.  Past Medical History:  Diagnosis Date  . AAA (abdominal aortic aneurysm) ([place])   . Allergy, unspecified not elsewhere classified   . Anxiety   . Arthropathy, unspecified, site unspecified   . Atrial fibrillation ([place])   . CAD (coronary artery disease)   . COPD (chronic obstructive pulmonary disease) ([place])   . Diabetes mellitus   . Esophageal reflux   . Family history of malignant neoplasm of gastrointestinal tract   . Hiatal hernia   . Hyperlipemia   . Hypertension   . Malignant neoplasm of prostate ([place])   . Peripheral vascular disease ([place])   . Stricture and stenosis of esophagus    Past Surgical History:  Procedure Laterality Date  . ABDOMINAL AORTA STENT    . CARDIOVERSION N/A 12/04/2015   Procedure: CARDIOVERSION;  Surgeon: Sanda Klein, MD;  [facility];  Service: Cardiovascular;  Laterality: N/A;  . INSERTION PROSTATE RADIATION SEED      reports that  he has been smoking Cigars.  He has never used smokeless tobacco. He reports that he does not drink alcohol or use drugs. family history includes Cancer in his brother and father; Colon cancer in his father; Deep vein thrombosis in his mother; Diabetes in his mother; Heart attack in his brother; Heart disease in his mother; Hyperlipidemia in his mother; Hypertension in his brother and mother; Varicose Veins in his mother. No Known Allergies    Outpatient Encounter Prescriptions as of 05/04/2016  Medication Sig  . DORZOLAMIDE HCL OP Apply to eye. Apply one drop each eye three times a day  . esomeprazole (NEXIUM) 40 MG capsule Take 40 mg by mouth daily at 12 noon.  Marland Kitchen glimepiride (AMARYL) 1 MG tablet Take 1 mg by mouth daily before breakfast.    . metoprolol tartrate (LOPRESSOR) 25 MG tablet Take 0.5 tablets (12.5 mg total) by mouth 2 (two) times daily.  . rivaroxaban (XARELTO) 20 MG TABS tablet Take 1 tablet (20 mg total) by mouth daily with supper.  . simvastatin (ZOCOR) 40 MG tablet Take 40 mg by mouth at bedtime.    . [DISCONTINUED] brinzolamide (AZOPT) 1 % ophthalmic suspension Place 1 drop into both eyes 3 (three) times daily.    . Na Sulfate-K Sulfate-Mg Sulf 17.5-3.13-1.6 GM/180ML SOLN Take 1 kit by mouth once.   No facility-administered encounter  medications on file as of 05/04/2016.      REVIEW OF SYSTEMS  : All other systems reviewed and negative except where noted in the History of Present Illness.   PHYSICAL EXAM: BP 110/74   Pulse 74   Ht '5\' 10"'  (1.778 m)   Wt 188 lb (85.3 kg)   BMI 26.98 kg/m  General: Well developed white male in no acute distress Head: Normocephalic and atraumatic Eyes:  Sclerae anicteric, conjunctiva pink. Ears: Normal auditory acuity Lungs: Clear throughout to auscultation.  No increased WOB. Heart: Regular rate and rhythm Abdomen: Soft, non-distended.  Normal bowel sounds.  Non-tender. Rectal:  Will be done at the time of  colonoscopy. Musculoskeletal: Symmetrical with no gross deformities  Skin: No lesions on visible extremities Extremities: No edema  Neurological: Alert oriented x 4, grossly non-focal Psychological:  Alert and cooperative. Normal mood and affect  ASSESSMENT AND PLAN: -Screening colonoscopy:  Will schedule with Dr. [name].   -Chronic anticoagulation with Xarelto for PAF:  Will hold Xarelto for 1-2 days prior to endoscopic procedures - will instruct when and how to resume after procedure. Benefits and risks of procedure explained including risks of bleeding, perforation, infection, missed lesions, reactions to medications and possible need for hospitalization and surgery for complications. Additional rare but real risk of stroke or other vascular clotting events off of Xarelto also explained and need to seek urgent help if any signs of these problems occur. Will communicate by phone or EMR with patient's prescribing provider, Dr. Marlou Porch, to confirm that holding Xarelto is reasonable in this case.  -RUQ abdominal pain:  Intermittent, radiates into his back. Some associated nausea.  CBC, BMP, LFT's ok.  Will check abdominal ultrasound.  Rule out gallbladder issues.  CC:  [name], MD

## 2016-05-04 NOTE — Telephone Encounter (Signed)
   Vincent Velez 1940/06/12 [id number]  Dear Dr. Marlou Porch:  We have scheduled the above named patient for a(n) Colonoscopy procedure. Our records show that (s)he is on anticoagulation therapy.  Please advise as to whether the patient may come of their therapy of Xarelto 1-2 days prior to their procedure which is scheduled for 07/09/16.  Please route your response to Marlon Pel, CMA or fax response to [phone].  Sincerely,    [facility]

## 2016-05-04 NOTE — Patient Instructions (Signed)
You have been scheduled for an abdominal ultrasound at [facility] (1st floor of hospital) on 05/13/17 at 8:30am. Please arrive 15 minutes prior to your appointment for registration. Make certain not to have anything to eat or drink 6 hours prior to your appointment. Should you need to reschedule your appointment, please contact radiology at [phone]. This test typically takes about 30 minutes to perform.  You have been scheduled for a colonoscopy. Please follow written instructions given to you at your visit today.  Please pick up your prep supplies at the pharmacy within the next 1-3 days. If you use inhalers (even only as needed), please bring them with you on the day of your procedure. Your physician has requested that you go to www.startemmi.com and enter the access code given to you at your visit today. This web site gives a general overview about your procedure. However, you should still follow specific instructions given to you by our office regarding your preparation for the procedure.

## 2016-05-05 NOTE — Telephone Encounter (Signed)
Patient notified per Dr. Marlou Porch to hold Xarelto 2 days prior to his procedure. Patient verbalized understanding.

## 2016-05-05 NOTE — Telephone Encounter (Signed)
Stop Xarelto 2 days prior to colonoscopy. OK to proceed.   Vincent Furbish, MD

## 2016-05-05 NOTE — Telephone Encounter (Signed)
Left a message for patient to return my call. 

## 2016-05-05 NOTE — Progress Notes (Signed)
Agree with initial assessment and plans as outlined 

## 2016-05-13 ENCOUNTER — Ambulatory Visit (HOSPITAL_COMMUNITY)
Admission: RE | Admit: 2016-05-13 | Discharge: 2016-05-13 | Disposition: A | Discharge status: Home / Self Care | Payer: Medicare Other | Source: Ambulatory Visit | Attending: Gastroenterology | Admitting: Gastroenterology

## 2016-05-13 DIAGNOSIS — I714 Abdominal aortic aneurysm, without rupture: Secondary | ICD-10-CM | POA: Insufficient documentation

## 2016-05-13 DIAGNOSIS — K824 Cholesterolosis of gallbladder: Secondary | ICD-10-CM | POA: Diagnosis not present

## 2016-05-13 DIAGNOSIS — R1011 Right upper quadrant pain: Secondary | ICD-10-CM | POA: Diagnosis present

## 2016-07-06 ENCOUNTER — Encounter: Payer: Self-pay | Admitting: Internal Medicine

## 2016-07-07 DIAGNOSIS — M7021 Olecranon bursitis, right elbow: Secondary | ICD-10-CM | POA: Diagnosis not present

## 2016-07-09 ENCOUNTER — Encounter: Payer: Medicare Other | Admitting: Internal Medicine

## 2016-07-09 DIAGNOSIS — M7021 Olecranon bursitis, right elbow: Secondary | ICD-10-CM | POA: Diagnosis not present

## 2016-09-05 ENCOUNTER — Other Ambulatory Visit: Payer: Self-pay | Admitting: Cardiology

## 2016-09-07 DIAGNOSIS — M7021 Olecranon bursitis, right elbow: Secondary | ICD-10-CM | POA: Diagnosis not present

## 2016-09-11 ENCOUNTER — Encounter (INDEPENDENT_AMBULATORY_CARE_PROVIDER_SITE_OTHER): Payer: Self-pay

## 2016-09-11 ENCOUNTER — Ambulatory Visit (INDEPENDENT_AMBULATORY_CARE_PROVIDER_SITE_OTHER): Payer: Medicare Other | Admitting: Physician Assistant

## 2016-09-11 ENCOUNTER — Telehealth: Payer: Self-pay | Admitting: Emergency Medicine

## 2016-09-11 ENCOUNTER — Encounter: Payer: Self-pay | Admitting: Physician Assistant

## 2016-09-11 VITALS — BP 104/68 | HR 92 | Ht 69.0 in | Wt 187.4 lb

## 2016-09-11 DIAGNOSIS — Z1211 Encounter for screening for malignant neoplasm of colon: Secondary | ICD-10-CM | POA: Diagnosis not present

## 2016-09-11 DIAGNOSIS — Z7901 Long term (current) use of anticoagulants: Secondary | ICD-10-CM | POA: Diagnosis not present

## 2016-09-11 NOTE — Telephone Encounter (Signed)
Vincent Velez 14-Feb-1941 [id number]  Dear Dr. Marlou Porch:  We have scheduled the above named patient for a(n) Colonoscopy procedure. Our records show that (s)he is on anticoagulation therapy.  Please advise as to whether the patient may come of their therapy of Xarelto 1-2 days prior to their procedure which is scheduled for 09/24/16.  Please route your response to Tinnie Gens, CMA or fax response to [phone].  Sincerely,    [facility]

## 2016-09-11 NOTE — Progress Notes (Signed)
Subjective:    Patient ID: Vincent Velez, male    DOB: [birth year], 76 y.o.   MRN: [id number]  HPI Vincent Velez is a pleasant 76 year old white male , established with Vincent Velez after he was seen in the office in December 2017 to discuss colonoscopy. He had been a previous patient of Vincent Velez with last colonoscopy in October 2006 normal. Patient does not have history of polyps. He had EGD in 2006 with finding of a 4 cm hiatal hernia with stricture. 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 Last echo showed EF of 45-50%. Patient is not sure about his family history of colon cancer, his father died at age 49 or 11 and was some question that he had colon cancer. He has no current GI symptoms, specifically no complaints of abdominal discomfort or changes in bowel habits melena or hematochezia.  Review of Systems Pertinent positive and negative review of systems were noted in the above HPI section.  All other review of systems was otherwise negative.  Outpatient Encounter Prescriptions as of 09/11/2016  Medication Sig  . DORZOLAMIDE HCL OP Apply to eye. Apply one drop each eye three times a day  . esomeprazole (NEXIUM) 40 MG capsule Take 40 mg by mouth daily at 12 noon.  Marland Kitchen glimepiride (AMARYL) 1 MG tablet Take 1 mg by mouth daily before breakfast.    . metoprolol tartrate (LOPRESSOR) 25 MG tablet Take 0.5 tablets (12.5 mg total) by mouth 2 (two) times daily.  . simvastatin (ZOCOR) 40 MG tablet Take 40 mg by mouth at bedtime.    Alveda Reasons 20 MG TABS tablet TAKE 1 TABLET BY MOUTH  DAILY WITH SUPPER   No facility-administered encounter medications on file as of 09/11/2016.    No Known Allergies Patient Active Problem List   Diagnosis Date Noted  . Special screening for malignant neoplasms, colon 05/04/2016  . RUQ abdominal pain 05/04/2016  . Chronic  anticoagulation 05/04/2016  . Persistent atrial fibrillation ([place])   . Aftercare following surgery of the circulatory system, [place] 05/19/2013  . Abdominal aneurysm without mention of rupture 05/10/2012  . ADENOCARCINOMA, 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  . ESOPHAGEAL STRICTURE 04/30/2008  . GERD 04/30/2008  . HIATAL HERNIA 04/30/2008  . ARTHRITIS 04/30/2008  . ALLERGY 04/30/2008   Social History   Social History  . Marital status: Married    Spouse name: N/A  . Number of children: 2  . Years of education: N/A   Occupational History  . engineer-retired [employer]   Social History Main Topics  . Smoking status: Light Tobacco Smoker    Types: Cigars  . Smokeless tobacco: Never Used  . Alcohol use No  . Drug use: No  . Sexual activity: Not on file   Other Topics Concern  . Not on file   Social History Narrative  . No narrative on file    Vincent Velez family history includes Colon cancer in his father; Deep vein thrombosis in his mother; Diabetes in his mother; Heart attack in his brother; Heart disease in his mother; Hyperlipidemia in his mother; Hypertension in his brother and mother; Prostate cancer in his brother; Varicose Veins in his mother.      Objective:    Vitals:   09/11/16 1459  BP: 104/68  Pulse: 92    Physical Exam  well-developed older white male in no acute distress,  pleasant blood pressure 104/68 pulse 92, height 5 foot 9, weight 187, BMI 27.6. HEENT ;nontraumatic normocephalic EOMI PERRLA sclera anicteric, Cardiovascular; irregular rate and rhythm with L8-V5 he has a systolic murmur , Pulmonary; clear bilaterally, Abdomen; soft, nontender nondistended bowel sounds are active there is no palpable mass or hepatosplenomegaly are present, Rectal; exam not done, Ext; no clubbing cyanosis or edema skin warm and dry, Neuropsych; mood and affect appropriate       Assessment & Plan:   #2  76 year old white male here to discuss follow-up colonoscopy in setting of chronic  anticoagulation with Xarelto. Patient asymptomatic, possible family history of colon cancer in patient'Vincent father #2  history of atrial fibrillation #3 previous abdominal aortic aneurysm stent graft #4 history of prostate cancer status post radiation #5 GERD history of esophageal stricture-stable  Plan; Will schedule for colonoscopy with Vincent Velez. Procedure discussed in detail with patient including risks and benefits and he is agreeable to proceed.  Patient will need to hold Xarelto for 24 hours prior to colonoscopy. We will communicate with his cardiologist Vincent Velez to assure that this is reasonable  for this patient.   Vincent Velez Vincent Riven Mabile PA-C 09/11/2016   Cc: Vincent Carol, MD

## 2016-09-11 NOTE — Patient Instructions (Signed)
You have been scheduled for a colonoscopy. Please follow written instructions given to you at your visit today.  Please pick up your prep supplies at the pharmacy within the next 1-3 days. If you use inhalers (even only as needed), please bring them with you on the day of your procedure. Your physician has requested that you go to www.startemmi.com and enter the access code given to you at your visit today. This web site gives a general overview about your procedure. However, you should still follow specific instructions given to you by our office regarding your preparation for the procedure.   You will be contacted by our office prior to your procedure for directions on holding your Xarelto.  If you do not hear from our office 1 week prior to your scheduled procedure, please call [phone] to discuss.

## 2016-09-14 ENCOUNTER — Telehealth: Payer: Self-pay

## 2016-09-14 NOTE — Telephone Encounter (Signed)
Patient informed and verbalized understanding

## 2016-09-14 NOTE — Telephone Encounter (Signed)
It is okay for him to come off of the Xarelto for 2 days prior to procedure. Candee Furbish, MD

## 2016-09-14 NOTE — Telephone Encounter (Signed)
Spoke to patient and informed him to hold his Xarelto 2 days before his procedure.

## 2016-09-14 NOTE — Telephone Encounter (Signed)
Printed and taken to MR to be faxed.  Also routed back to sender.

## 2016-09-15 NOTE — Progress Notes (Signed)
Assessment and plans reviewed  

## 2016-09-16 DIAGNOSIS — H401112 Primary open-angle glaucoma, right eye, moderate stage: Secondary | ICD-10-CM | POA: Diagnosis not present

## 2016-09-16 DIAGNOSIS — H04123 Dry eye syndrome of bilateral lacrimal glands: Secondary | ICD-10-CM | POA: Diagnosis not present

## 2016-09-16 DIAGNOSIS — H401122 Primary open-angle glaucoma, left eye, moderate stage: Secondary | ICD-10-CM | POA: Diagnosis not present

## 2016-09-24 ENCOUNTER — Ambulatory Visit (AMBULATORY_SURGERY_CENTER): Payer: Medicare Other | Admitting: Internal Medicine

## 2016-09-24 ENCOUNTER — Encounter: Payer: Self-pay | Admitting: Internal Medicine

## 2016-09-24 VITALS — BP 100/72 | HR 79 | Temp 97.5°F | Resp 14 | Ht 69.0 in | Wt 187.0 lb

## 2016-09-24 DIAGNOSIS — D124 Benign neoplasm of descending colon: Secondary | ICD-10-CM

## 2016-09-24 DIAGNOSIS — Z1212 Encounter for screening for malignant neoplasm of rectum: Secondary | ICD-10-CM | POA: Diagnosis not present

## 2016-09-24 DIAGNOSIS — Z1211 Encounter for screening for malignant neoplasm of colon: Secondary | ICD-10-CM

## 2016-09-24 DIAGNOSIS — I4891 Unspecified atrial fibrillation: Secondary | ICD-10-CM | POA: Diagnosis not present

## 2016-09-24 MED ORDER — SODIUM CHLORIDE 0.9 % IV SOLN: 500.0000 mL | INTRAVENOUS | Status: DC

## 2016-09-24 NOTE — Patient Instructions (Signed)
YOU HAD AN ENDOSCOPIC PROCEDURE TODAY AT [facility]:   Refer to the procedure report that was given to you for any specific questions about what was found during the examination.  If the procedure report does not answer your questions, please call your gastroenterologist to clarify.  If you requested that your care partner not be given the details of your procedure findings, then the procedure report has been included in a sealed envelope for you to review at your convenience later.  YOU SHOULD EXPECT: Some feelings of bloating in the abdomen. Passage of more gas than usual.  Walking can help get rid of the air that was put into your GI tract during the procedure and reduce the bloating. If you had a lower endoscopy (such as a colonoscopy or flexible sigmoidoscopy) you may notice spotting of blood in your stool or on the toilet paper. If you underwent a bowel prep for your procedure, you may not have a normal bowel movement for a few days.  Please Note:  You might notice some irritation and congestion in your nose or some drainage.  This is from the oxygen used during your procedure.  There is no need for concern and it should clear up in a day or so.  SYMPTOMS TO REPORT IMMEDIATELY:   Following lower endoscopy (colonoscopy or flexible sigmoidoscopy):  Excessive amounts of blood in the stool  Significant tenderness or worsening of abdominal pains  Swelling of the abdomen that is new, acute  Fever of 100F or higher    For urgent or emergent issues, a gastroenterologist can be reached at any hour by calling [phone].   DIET:  We do recommend a small meal at first, but then you may proceed to your regular diet.  Drink plenty of fluids but you should avoid alcoholic beverages for 24 hours.  ACTIVITY:  You should plan to take it easy for the rest of today and you should NOT DRIVE or use heavy machinery until tomorrow (because of the sedation medicines used during the test).     FOLLOW UP: Our staff will call the number listed on your records the next business day following your procedure to check on you and address any questions or concerns that you may have regarding the information given to you following your procedure. If we do not reach you, we will leave a message.  However, if you are feeling well and you are not experiencing any problems, there is no need to return our call.  We will assume that you have returned to your regular daily activities without incident.  If any biopsies were taken you will be contacted by phone or by letter within the next 1-3 weeks.  Please call us at [phone] if you have not heard about the biopsies in 3 weeks.    SIGNATURES/CONFIDENTIALITY: You and/or your care partner have signed paperwork which will be entered into your electronic medical record.  These signatures attest to the fact that that the information above on your After Visit Summary has been reviewed and is understood.  Full responsibility of the confidentiality of this discharge information lies with you and/or your care-partner.   Information on polyps ,diverticulosis,and hemorrhoids given to you today  Resume Xarelto today at prior dose

## 2016-09-24 NOTE — Progress Notes (Signed)
Called to room to assist during endoscopic procedure.  Patient ID and intended procedure confirmed with present staff. Received instructions for my participation in the procedure from the performing physician.  

## 2016-09-24 NOTE — Progress Notes (Signed)
No egg or soy allergy known to patient  No issues with past sedation with any surgeries  or procedures, no intubation problems  No diet pills per patient No home 02 use per patient   Pt denies issues with constipation  Hx of  A fib- on xarelto- last dose Tuesday 09-22-2016

## 2016-09-24 NOTE — Op Note (Signed)
[facility] Patient Name: Vincent Velez Procedure Date: 09/24/2016 1:47 PM MRN: [id number] Endoscopist: Docia Chuck. [name] , MD Age: 76 Referring MD:  Date of Birth: 1940-12-05 Gender: Male Account #: [id number] Procedure:                Colonoscopy, with cold snare polypectomy x 1 Indications:              Screening for colorectal malignant neoplasm.                            Previous examination 2006 with Dr. Sharlett Iles was                            negative Medicines:                Monitored Anesthesia Care Procedure:                Pre-Anesthesia Assessment:                           - Prior to the procedure, a History and Physical                            was performed, and patient medications and                            allergies were reviewed. The patient's tolerance of                            previous anesthesia was also reviewed. The risks                            and benefits of the procedure and the sedation                            options and risks were discussed with the patient.                            All questions were answered, and informed consent                            was obtained. Prior Anticoagulants: The patient has                            taken Xarelto (rivaroxaban), last dose was 2 days                            prior to procedure. ASA Grade Assessment: III - A                            patient with severe systemic disease. After                            reviewing the risks and benefits, the patient was  deemed in satisfactory condition to undergo the                            procedure.                           After obtaining informed consent, the colonoscope                            was passed under direct vision. Throughout the                            procedure, the patient's blood pressure, pulse, and                            oxygen saturations were monitored continuously. The                     Colonoscope was introduced through the anus and                            advanced to the the cecum, identified by                            appendiceal orifice and ileocecal valve. The                            ileocecal valve, appendiceal orifice, and rectum                            were photographed. The quality of the bowel                            preparation was excellent. The colonoscopy was                            performed without difficulty. The patient tolerated                            the procedure well. The bowel preparation used was                            SUPREP. Scope In: 1:53:58 PM Scope Out: 2:10:00 PM Scope Withdrawal Time: 0 hours 12 minutes 43 seconds  Total Procedure Duration: 0 hours 16 minutes 2 seconds  Findings:                 A 2 mm polyp was found in the descending colon. The                            polyp was removed with a cold snare. Resection and                            retrieval were complete.  A few small-mouthed diverticula were found in the                            sigmoid colon.                           Internal hemorrhoids were found during retroflexion.                           The exam was otherwise without abnormality on                            direct and retroflexion views. Complications:            No immediate complications. Estimated blood loss:                            None. Estimated Blood Loss:     Estimated blood loss: none. Impression:               - One 2 mm polyp in the descending colon, removed                            with a cold snare. Resected and retrieved.                           - Diverticulosis in the sigmoid colon.                           - Internal hemorrhoids.                           - The examination was otherwise normal on direct                            and retroflexion views. Recommendation:           - Repeat colonoscopy in 5 years for  surveillance if                            polyp adenomatous. Otherwise no follow-up required.                           - Resume Xarelto (rivaroxaban) today at prior dose.                           - Patient has a contact number available for                            emergencies. The signs and symptoms of potential                            delayed complications were discussed with the                            patient. Return to normal activities tomorrow.  Written discharge instructions were provided to the                            patient.                           - Resume previous diet.                           - Continue present medications.                           - Await pathology results. Docia Chuck. [name], MD 09/24/2016 2:16:25 PM This report has been signed electronically.

## 2016-09-24 NOTE — Progress Notes (Signed)
Patient awakening,vss,report to rn 

## 2016-09-25 ENCOUNTER — Telehealth: Payer: Self-pay

## 2016-09-25 NOTE — Telephone Encounter (Signed)
  Follow up Call-  Call back number 09/24/2016  Post procedure Call Back phone  # [phone]  Permission to leave phone message Yes  Some recent data might be hidden     Patient questions:  Do you have a fever, pain , or abdominal swelling? No. Pain Score  0 *  Have you tolerated food without any problems? Yes.    Have you been able to return to your normal activities? Yes.    Do you have any questions about your discharge instructions: Diet   No. Medications  No. Follow up visit  No.  Do you have questions or concerns about your Care? No.  Actions: * If pain score is 4 or above: No action needed, pain <4.

## 2016-09-29 ENCOUNTER — Encounter: Payer: Self-pay | Admitting: Internal Medicine

## 2016-10-13 DIAGNOSIS — I480 Paroxysmal atrial fibrillation: Secondary | ICD-10-CM | POA: Diagnosis not present

## 2016-10-13 DIAGNOSIS — E119 Type 2 diabetes mellitus without complications: Secondary | ICD-10-CM | POA: Diagnosis not present

## 2016-10-13 DIAGNOSIS — Z7984 Long term (current) use of oral hypoglycemic drugs: Secondary | ICD-10-CM | POA: Diagnosis not present

## 2016-10-13 DIAGNOSIS — C61 Malignant neoplasm of prostate: Secondary | ICD-10-CM | POA: Diagnosis not present

## 2016-10-13 DIAGNOSIS — E7800 Pure hypercholesterolemia, unspecified: Secondary | ICD-10-CM | POA: Diagnosis not present

## 2016-10-13 DIAGNOSIS — F411 Generalized anxiety disorder: Secondary | ICD-10-CM | POA: Diagnosis not present

## 2016-10-13 DIAGNOSIS — I10 Essential (primary) hypertension: Secondary | ICD-10-CM | POA: Diagnosis not present

## 2016-10-13 DIAGNOSIS — F17210 Nicotine dependence, cigarettes, uncomplicated: Secondary | ICD-10-CM | POA: Diagnosis not present

## 2016-10-13 DIAGNOSIS — E1165 Type 2 diabetes mellitus with hyperglycemia: Secondary | ICD-10-CM | POA: Diagnosis not present

## 2016-11-02 ENCOUNTER — Other Ambulatory Visit: Payer: Self-pay | Admitting: Cardiology

## 2017-02-22 IMAGING — US US ABDOMEN COMPLETE
1 series · 14 of 25 positions shown · non-contrast
Comparison: CT scan of [DATE].

CLINICAL DATA: Right upper quadrant abdominal pain.

EXAM:
ABDOMEN ULTRASOUND COMPLETE

[Series 1: us abdomen complete · 0.23mm/px · 14 of 114 slices shown]
[im 1/114]
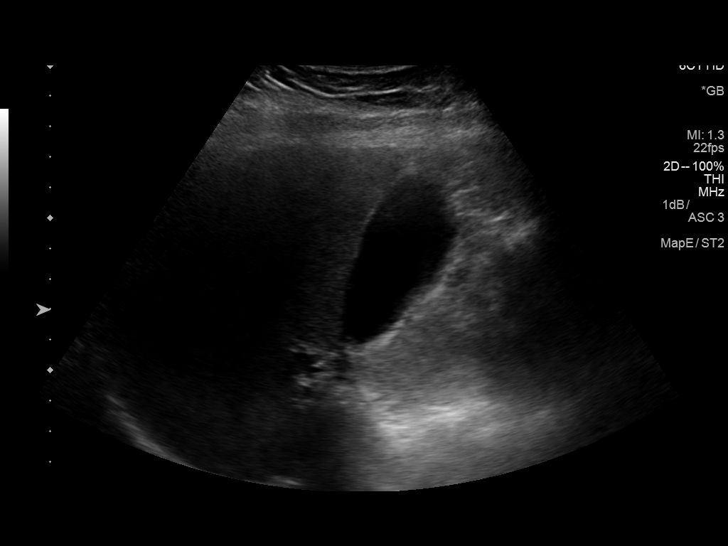
[im 10/114]
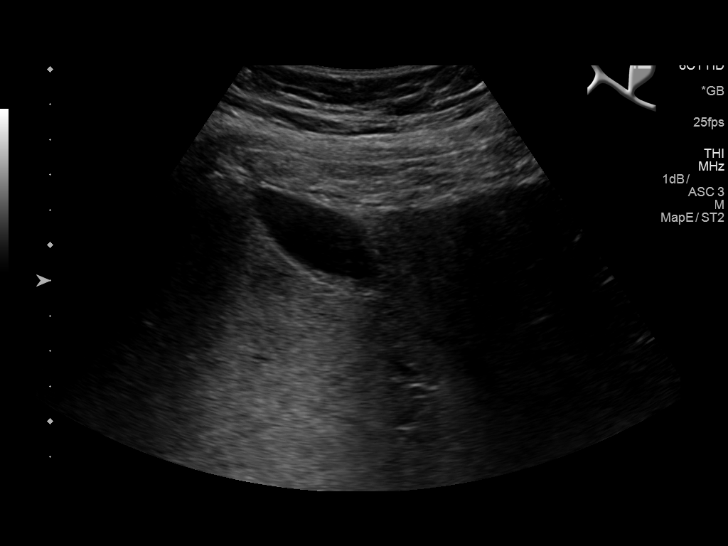
[im 19/114]
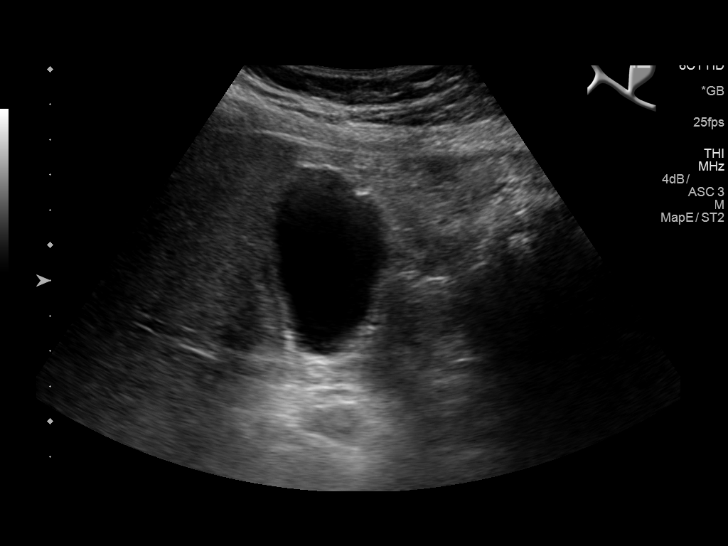
[im 29/114]
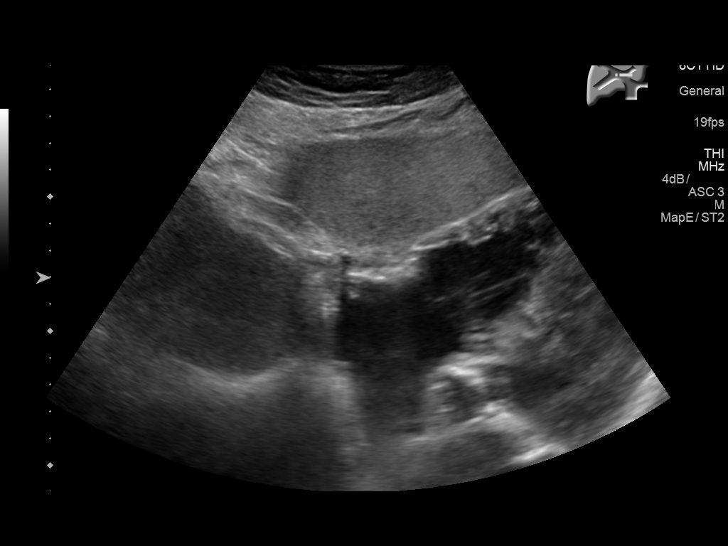
[im 38/114]
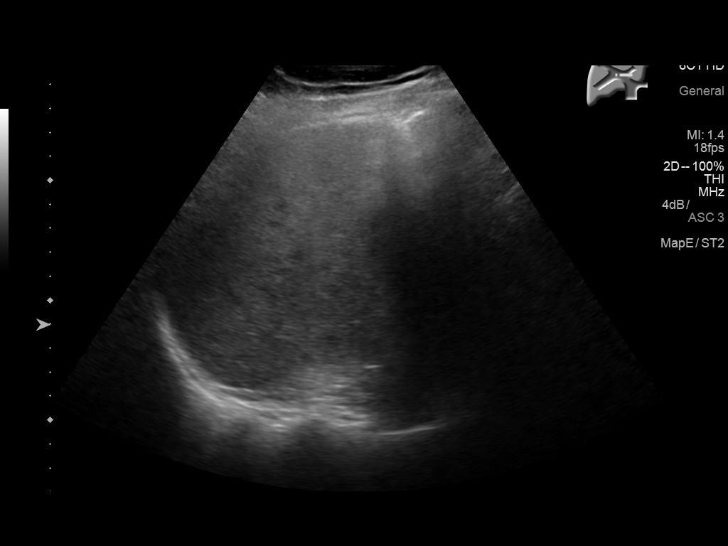
[im 43/114]
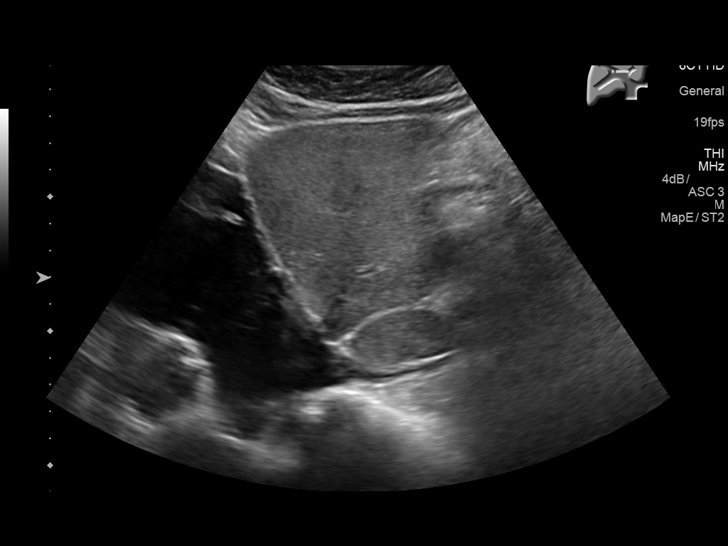
[im 52/114]
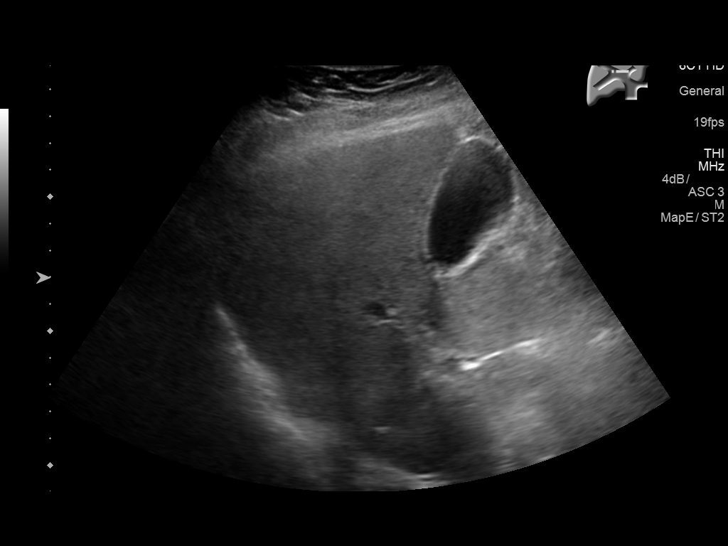
[im 62/114]
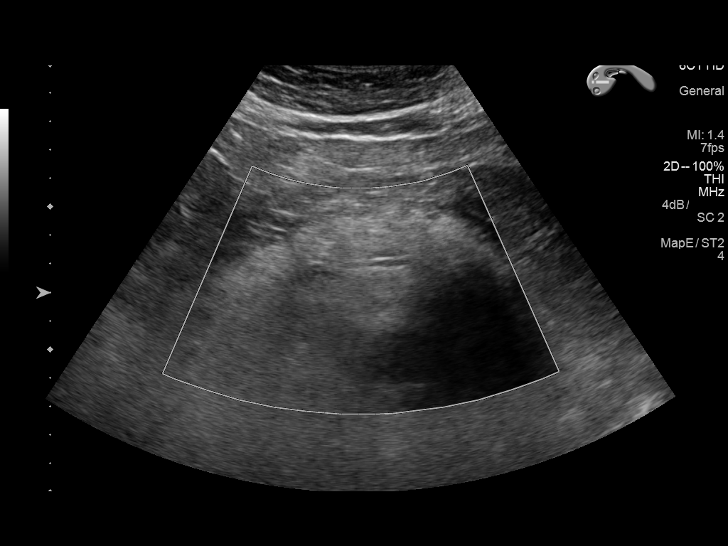
[im 71/114]
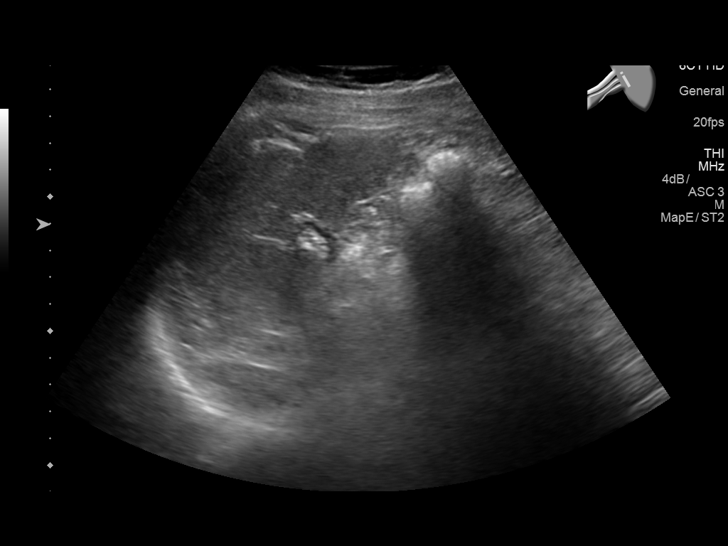
[im 76/114]
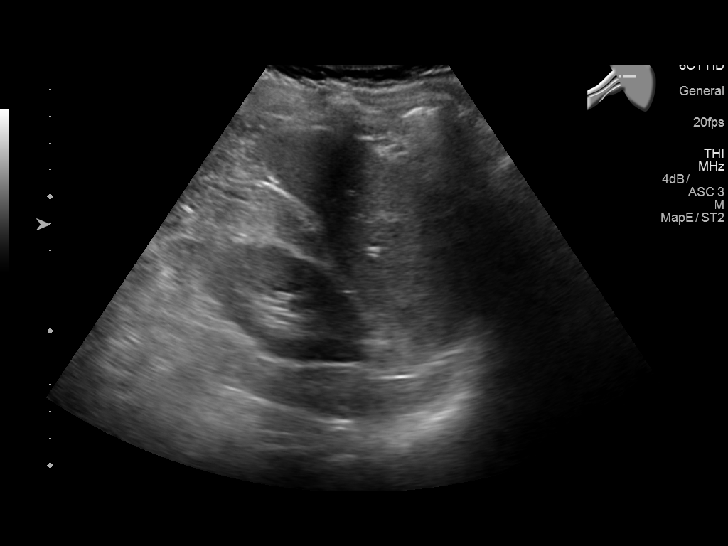
[im 85/114]
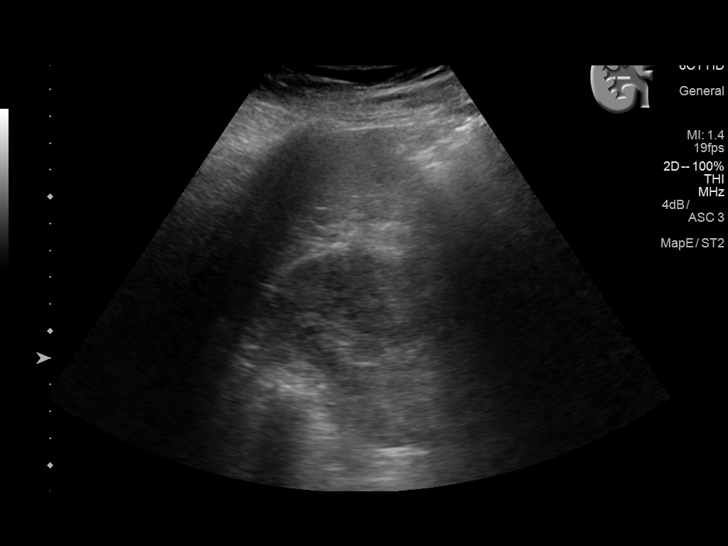
[im 95/114]
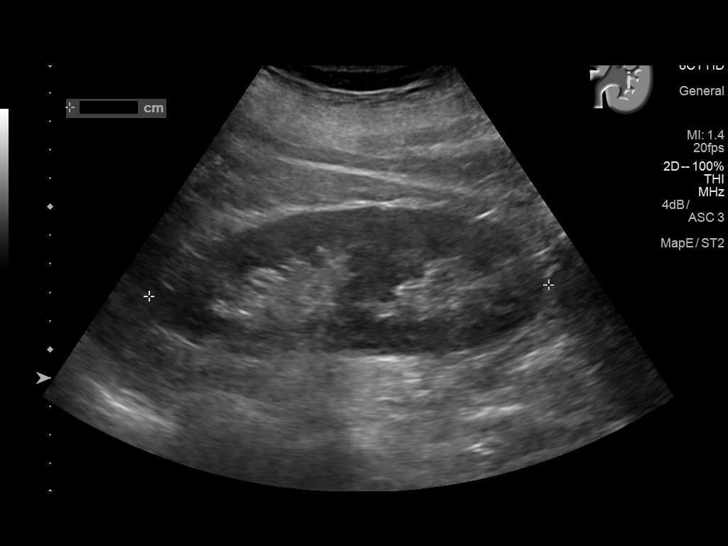
[im 104/114]
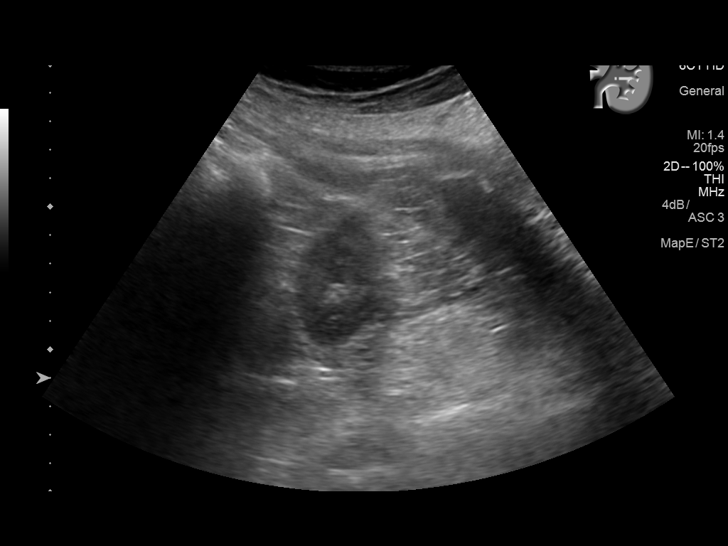
[im 114/114]
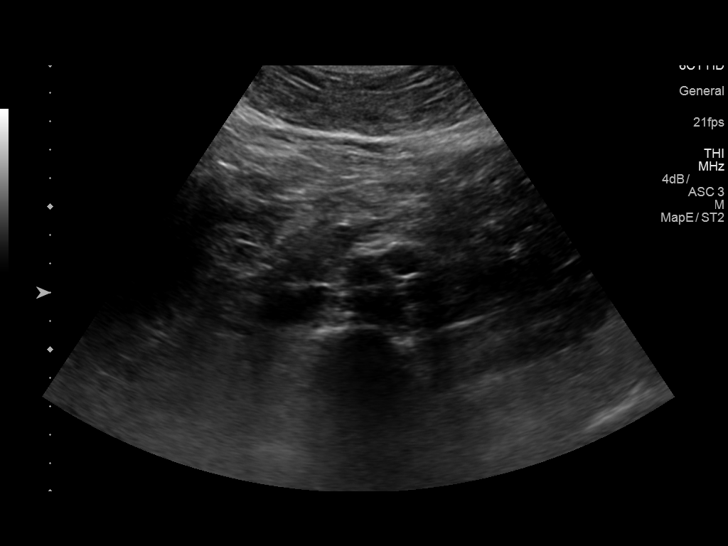

[14 of 25 positions shown; findings below may reference images not displayed]

FINDINGS: Gallbladder: No gallstones or wall thickening visualized. No
sonographic Murphy sign noted by sonographer. 5 mm gallbladder polyp
is noted.

Common bile duct: Diameter: 3.5 mm which is within normal limits.

Liver: No focal lesion identified. Mildly increased echogenicity of
hepatic parenchyma is noted.

IVC: No abnormality visualized.

Pancreas: Visualized portion unremarkable.

Spleen: Size and appearance within normal limits.

Right Kidney: Length: 11.5 cm. Echogenicity within normal limits. No
mass or hydronephrosis visualized.

Left Kidney: Length: 14 cm. Echogenicity within normal limits. No
mass or hydronephrosis visualized.

Abdominal aorta: Status post stent graft repair of abdominal aortic
aneurysm. Aneurysm has maximum measured AP diameter 3.6 cm.

Other findings: None.
IMPRESSION: 5 mm gallbladder polyp.

Mildly increased echogenicity of hepatic parenchyma suggesting
possible fatty infiltration.

Status post stent graft repair of abdominal aortic aneurysm, which
has maximum measured AP diameter of 3.6 cm.

## 2017-03-19 ENCOUNTER — Other Ambulatory Visit: Payer: Self-pay | Admitting: Cardiology

## 2017-03-23 DIAGNOSIS — H401112 Primary open-angle glaucoma, right eye, moderate stage: Secondary | ICD-10-CM | POA: Diagnosis not present

## 2017-03-23 DIAGNOSIS — H401122 Primary open-angle glaucoma, left eye, moderate stage: Secondary | ICD-10-CM | POA: Diagnosis not present

## 2017-03-23 DIAGNOSIS — H5213 Myopia, bilateral: Secondary | ICD-10-CM | POA: Diagnosis not present

## 2017-03-23 DIAGNOSIS — E119 Type 2 diabetes mellitus without complications: Secondary | ICD-10-CM | POA: Diagnosis not present

## 2017-04-12 ENCOUNTER — Other Ambulatory Visit: Payer: Self-pay | Admitting: Cardiology

## 2017-04-15 ENCOUNTER — Encounter: Payer: Self-pay | Admitting: Family

## 2017-04-15 ENCOUNTER — Ambulatory Visit (HOSPITAL_COMMUNITY)
Admission: RE | Admit: 2017-04-15 | Discharge: 2017-04-15 | Disposition: A | Discharge status: Home / Self Care | Payer: Medicare Other | Source: Ambulatory Visit | Attending: Family | Admitting: Family

## 2017-04-15 ENCOUNTER — Ambulatory Visit (INDEPENDENT_AMBULATORY_CARE_PROVIDER_SITE_OTHER): Payer: Medicare Other | Admitting: Family

## 2017-04-15 VITALS — BP 117/84 | HR 79 | Temp 97.0°F | Wt 189.7 lb

## 2017-04-15 DIAGNOSIS — Z95828 Presence of other vascular implants and grafts: Secondary | ICD-10-CM | POA: Diagnosis not present

## 2017-04-15 DIAGNOSIS — R0989 Other specified symptoms and signs involving the circulatory and respiratory systems: Secondary | ICD-10-CM

## 2017-04-15 DIAGNOSIS — I714 Abdominal aortic aneurysm, without rupture, unspecified: Secondary | ICD-10-CM
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
## 2017-04-15 NOTE — Patient Instructions (Addendum)
Before your next abdominal ultrasound:  Take two Extra-Strength Gas-X capsules at bedtime the night before the test. Take another two Extra-Strength Gas-X capsules 3 hours before the test.  Avoid gas forming foods the day before the test.      Stroke Prevention Some health problems and behaviors may make it more likely for you to have a stroke. Below are ways to lessen your risk of having a stroke.  Be active for at least 30 minutes on most or all days.  Do not smoke. Try not to be around others who smoke.  Do not drink too much alcohol. ? Do not have more than 2 drinks a day if you are a man. ? Do not have more than 1 drink a day if you are a woman and are not pregnant.  Eat healthy foods, such as fruits and vegetables. If you were put on a specific diet, follow the diet as told.  Keep your cholesterol levels under control through diet and medicines. Look for foods that are low in saturated fat, trans fat, cholesterol, and are high in fiber.  If you have diabetes, follow all diet plans and take your medicine as told.  Ask your doctor if you need treatment to lower your blood pressure. If you have high blood pressure (hypertension), follow all diet plans and take your medicine as told by your doctor.  If you are 88-74 years old, have your blood pressure checked every 3-5 years. If you are age 48 or older, have your blood pressure checked every year.  Keep a healthy weight. Eat foods that are low in calories, salt, saturated fat, trans fat, and cholesterol.  Do not take drugs.  Avoid birth control pills, if this applies. Talk to your doctor about the risks of taking birth control pills.  Talk to your doctor if you have sleep problems (sleep apnea).  Take all medicine as told by your doctor. ? You may be told to take aspirin or blood thinner medicine. Take this medicine as told by your doctor. ? Understand your medicine instructions.  Make sure any other conditions you have are  being taken care of.  Get help right away if:  You suddenly lose feeling (you feel numb) or have weakness in your face, arm, or leg.  Your face or eyelid hangs down to one side.  You suddenly feel confused.  You have trouble talking (aphasia) or understanding what people are saying.  You suddenly have trouble seeing in one or both eyes.  You suddenly have trouble walking.  You are dizzy.  You lose your balance or your movements are clumsy (uncoordinated).  You suddenly have a very bad headache and you do not know the cause.  You have new chest pain.  Your heart feels like it is fluttering or skipping a beat (irregular heartbeat). Do not wait to see if the symptoms above go away. Get help right away. Call your local emergency services (911 in [place] Do not drive yourself to the hospital. This information is not intended to replace advice given to you by your health care provider. Make sure you discuss any questions you have with your health care provider. Document Released: 11/17/2011 Document Revised: 10/24/2015 Document Reviewed: 11/18/2012 Elsevier Interactive Patient Education  [employer].

## 2017-04-30 ENCOUNTER — Ambulatory Visit (HOSPITAL_COMMUNITY)
Admission: RE | Admit: 2017-04-30 | Discharge: 2017-04-30 | Disposition: A | Discharge status: Home / Self Care | Payer: Medicare Other | Source: Ambulatory Visit | Attending: Vascular Surgery | Admitting: Vascular Surgery

## 2017-04-30 ENCOUNTER — Encounter: Payer: Self-pay | Admitting: Family

## 2017-04-30 ENCOUNTER — Ambulatory Visit (INDEPENDENT_AMBULATORY_CARE_PROVIDER_SITE_OTHER): Payer: Medicare Other | Admitting: Family

## 2017-04-30 VITALS — BP 117/76 | HR 60 | Temp 97.0°F | Resp 18 | Wt 191.9 lb

## 2017-04-30 DIAGNOSIS — Z95828 Presence of other vascular implants and grafts: Secondary | ICD-10-CM

## 2017-04-30 DIAGNOSIS — I714 Abdominal aortic aneurysm, without rupture, unspecified: Secondary | ICD-10-CM

## 2017-04-30 DIAGNOSIS — Z9889 Other specified postprocedural states: Secondary | ICD-10-CM | POA: Diagnosis not present

## 2017-04-30 DIAGNOSIS — R0989 Other specified symptoms and signs involving the circulatory and respiratory systems: Secondary | ICD-10-CM

## 2017-04-30 DIAGNOSIS — I6523 Occlusion and stenosis of bilateral carotid arteries: Secondary | ICD-10-CM | POA: Diagnosis not present

## 2017-04-30 LAB — VAS US CAROTID
LCCADSYS: 55 cm/s
LCCAPDIAS: -19 cm/s
LEFT ECA DIAS: -22 cm/s
LICADDIAS: -14 cm/s
LICADSYS: -29 cm/s
LICAPDIAS: -18 cm/s
LICAPSYS: -40 cm/s
Left CCA dist dias: 23 cm/s
Left CCA prox sys: -65 cm/s
RCCAPDIAS: 23 cm/s
RIGHT CCA MID DIAS: -38 cm/s
RIGHT ECA DIAS: -29 cm/s
Right CCA prox sys: 60 cm/s
Right cca dist sys: -46 cm/s
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
## 2017-04-30 NOTE — Patient Instructions (Signed)
Steps to Quit Smoking Smoking tobacco can be bad for your health. It can also affect almost every organ in your body. Smoking puts you and people around you at risk for many serious long-lasting (chronic) diseases. Quitting smoking is hard, but it is one of the best things that you can do for your health. It is never too late to quit. What are the benefits of quitting smoking? When you quit smoking, you lower your risk for getting serious diseases and conditions. They can include:  Lung cancer or lung disease.  Heart disease.  Stroke.  Heart attack.  Not being able to have children (infertility).  Weak bones (osteoporosis) and broken bones (fractures).  If you have coughing, wheezing, and shortness of breath, those symptoms may get better when you quit. You may also get sick less often. If you are pregnant, quitting smoking can help to lower your chances of having a baby of low birth weight. What can I do to help me quit smoking? Talk with your doctor about what can help you quit smoking. Some things you can do (strategies) include:  Quitting smoking totally, instead of slowly cutting back how much you smoke over a period of time.  Going to in-person counseling. You are more likely to quit if you go to many counseling sessions.  Using resources and support systems, such as: ? Online chats with a counselor. ? Phone quitlines. ? Printed self-help materials. ? Support groups or group counseling. ? Text messaging programs. ? Mobile phone apps or applications.  Taking medicines. Some of these medicines may have nicotine in them. If you are pregnant or breastfeeding, do not take any medicines to quit smoking unless your doctor says it is okay. Talk with your doctor about counseling or other things that can help you.  Talk with your doctor about using more than one strategy at the same time, such as taking medicines while you are also going to in-person counseling. This can help make  quitting easier. What things can I do to make it easier to quit? Quitting smoking might feel very hard at first, but there is a lot that you can do to make it easier. Take these steps:  Talk to your family and friends. Ask them to support and encourage you.  Call phone quitlines, reach out to support groups, or work with a counselor.  Ask people who smoke to not smoke around you.  Avoid places that make you want (trigger) to smoke, such as: ? Bars. ? Parties. ? Smoke-break areas at work.  Spend time with people who do not smoke.  Lower the stress in your life. Stress can make you want to smoke. Try these things to help your stress: ? Getting regular exercise. ? Deep-breathing exercises. ? Yoga. ? Meditating. ? Doing a body scan. To do this, close your eyes, focus on one area of your body at a time from head to toe, and notice which parts of your body are tense. Try to relax the muscles in those areas.  Download or buy apps on your mobile phone or tablet that can help you stick to your quit plan. There are many free apps, such as QuitGuide from the CDC (Centers for Disease Control and Prevention). You can find more support from smokefree.gov and other websites.  This information is not intended to replace advice given to you by your health care provider. Make sure you discuss any questions you have with your health care provider. Document Released: 03/14/2009 Document   Revised: 01/14/2016 Document Reviewed: 10/02/2014 Elsevier Interactive Patient Education  2018 Elsevier Inc.  

## 2017-05-04 DIAGNOSIS — F17210 Nicotine dependence, cigarettes, uncomplicated: Secondary | ICD-10-CM | POA: Diagnosis not present

## 2017-05-04 DIAGNOSIS — I714 Abdominal aortic aneurysm, without rupture: Secondary | ICD-10-CM | POA: Diagnosis not present

## 2017-05-04 DIAGNOSIS — I480 Paroxysmal atrial fibrillation: Secondary | ICD-10-CM | POA: Diagnosis not present

## 2017-05-04 DIAGNOSIS — I359 Nonrheumatic aortic valve disorder, unspecified: Secondary | ICD-10-CM | POA: Diagnosis not present

## 2017-05-04 DIAGNOSIS — Z0000 Encounter for general adult medical examination without abnormal findings: Secondary | ICD-10-CM | POA: Diagnosis not present

## 2017-05-04 DIAGNOSIS — Z1389 Encounter for screening for other disorder: Secondary | ICD-10-CM | POA: Diagnosis not present

## 2017-05-04 DIAGNOSIS — E119 Type 2 diabetes mellitus without complications: Secondary | ICD-10-CM | POA: Diagnosis not present

## 2017-05-04 DIAGNOSIS — I10 Essential (primary) hypertension: Secondary | ICD-10-CM | POA: Diagnosis not present

## 2017-05-04 DIAGNOSIS — Z23 Encounter for immunization: Secondary | ICD-10-CM | POA: Diagnosis not present

## 2017-05-04 DIAGNOSIS — E7800 Pure hypercholesterolemia, unspecified: Secondary | ICD-10-CM | POA: Diagnosis not present

## 2017-05-04 DIAGNOSIS — F419 Anxiety disorder, unspecified: Secondary | ICD-10-CM | POA: Diagnosis not present

## 2017-05-04 DIAGNOSIS — C61 Malignant neoplasm of prostate: Secondary | ICD-10-CM | POA: Diagnosis not present

## 2017-05-24 NOTE — Addendum Note (Signed)
Addended by: Lianne Cure A on: 05/24/2017 11:03 AM   Modules accepted: Orders

## 2017-06-02 ENCOUNTER — Telehealth: Payer: Self-pay

## 2017-06-02 NOTE — Telephone Encounter (Signed)
The pt called the office requesting a medication that costs less than Xarelto as he is paying $400 per refill.  I called [facility] and was advised that the pts cost for a 90 day supply of Xarelto is $105.  The pt is interested is switching to Eliquis as he has taken it in the past.  I have advised him that Eliquis is close in price to Xarelto and that Coumadin may be his only choice in lowering the cost of his medication but that I will send this message to Dr Marlou Porch in hopes that he may have a recommendation.  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

## 2017-06-04 NOTE — Telephone Encounter (Signed)
This is fine to switch to Eliquis Candee Furbish, MD

## 2017-06-07 ENCOUNTER — Other Ambulatory Visit: Payer: Self-pay

## 2017-06-07 MED ORDER — APIXABAN 5 MG PO TABS: 5.0000 mg | ORAL_TABLET | Freq: Two times a day (BID) | ORAL | 6 refills | Status: DC

## 2017-06-07 MED ORDER — RIVAROXABAN 20 MG PO TABS: 20.0000 mg | ORAL_TABLET | Freq: Every day | ORAL | 1 refills | Status: DC

## 2017-06-07 NOTE — Telephone Encounter (Signed)
**Note De-Identified Jaivian Battaglini Obfuscation** Once I get clarification from Dr Marlou Porch on the mg of Eliquis he wants the pt to take I will send it to the pts pharmacy to fill.  I also left a message on the pts VM asking him to call me back so I can advise him of Dr Marlou Porch recommendation to switch to Eliquis in hopes that it is less expensive than Xarelto.

## 2017-06-07 NOTE — Telephone Encounter (Signed)
Eliquis 5 mg p.o. twice a day.  Thank you Candee Furbish, MD

## 2017-06-07 NOTE — Telephone Encounter (Signed)
The pt is advised. Per his request I have [facility] He states that he is going to call his pharmacy to see if Eliquis is less expensive than Xarelto. He states that he will call back if he needs to switch it back to Xarelto due to cost.

## 2017-06-07 NOTE — Telephone Encounter (Signed)
**Note De-Identified Vincent Velez Obfuscation** The pt called back and stated that he called his insurance company and discussed Eliquis versus Xarelto as far as cost. He has decided he wants to stay on Xarelto because that is what Dr Marlou Porch first recommended for him.  Per [facility] request [facility] and sent a RX for Xarelto to [facility] for a 90 day supply with 1 refill.

## 2017-06-07 NOTE — Addendum Note (Signed)
Addended by: Dennie Fetters on: 06/07/2017 01:01 PM   Modules accepted: Orders

## 2017-07-08 ENCOUNTER — Telehealth: Payer: Self-pay | Admitting: Cardiology

## 2017-07-08 NOTE — Telephone Encounter (Signed)
New message    Pt c/o medication issue:  1. Name of Medication: metoprolol tartrate (LOPRESSOR) 25 MG tablet  2. How are you currently taking this medication (dosage and times per day)? TAKE 1 TABLET BY MOUTH TWO TIMES DAILY  3. Are you having a reaction (difficulty breathing--STAT)? No   4. What is your medication issue? Can he be switch to 25 mg one tab once a day.

## 2017-07-08 NOTE — Telephone Encounter (Signed)
Will forward to Dr Marlou Porch for review and orders.

## 2017-07-09 NOTE — Telephone Encounter (Signed)
Reviewed with Dr Marlou Porch.  As long as HR and BP are within normal - OK for pt to change from tartrate 25 mg BID to Succinate 50 mg a day. Spoke with Tammy who reports yesterday BP was 120/82 and HR 78.  Pt has been taking metoprolol 25 mg 1/2 tablet twice a day (for a total of 25 mg a day) and has been having trouble cutting them in two.  Advised in that case, pt should take 25 mg succinate daily.  Tammy states understanding and is in agreement.  They will f/u with monitoring pt's VS.

## 2017-07-12 DIAGNOSIS — H409 Unspecified glaucoma: Secondary | ICD-10-CM | POA: Diagnosis not present

## 2017-07-12 DIAGNOSIS — E7849 Other hyperlipidemia: Secondary | ICD-10-CM | POA: Diagnosis not present

## 2017-07-12 DIAGNOSIS — C61 Malignant neoplasm of prostate: Secondary | ICD-10-CM | POA: Diagnosis not present

## 2017-07-12 DIAGNOSIS — I480 Paroxysmal atrial fibrillation: Secondary | ICD-10-CM | POA: Diagnosis not present

## 2017-07-12 DIAGNOSIS — E139 Other specified diabetes mellitus without complications: Secondary | ICD-10-CM | POA: Diagnosis not present

## 2017-07-12 DIAGNOSIS — Z7984 Long term (current) use of oral hypoglycemic drugs: Secondary | ICD-10-CM | POA: Diagnosis not present

## 2017-07-12 DIAGNOSIS — I10 Essential (primary) hypertension: Secondary | ICD-10-CM | POA: Diagnosis not present

## 2017-07-12 DIAGNOSIS — E119 Type 2 diabetes mellitus without complications: Secondary | ICD-10-CM | POA: Diagnosis not present

## 2017-08-02 DIAGNOSIS — E7849 Other hyperlipidemia: Secondary | ICD-10-CM | POA: Diagnosis not present

## 2017-08-02 DIAGNOSIS — I10 Essential (primary) hypertension: Secondary | ICD-10-CM | POA: Diagnosis not present

## 2017-08-02 DIAGNOSIS — E139 Other specified diabetes mellitus without complications: Secondary | ICD-10-CM | POA: Diagnosis not present

## 2017-08-02 DIAGNOSIS — E119 Type 2 diabetes mellitus without complications: Secondary | ICD-10-CM | POA: Diagnosis not present

## 2017-08-02 DIAGNOSIS — C61 Malignant neoplasm of prostate: Secondary | ICD-10-CM | POA: Diagnosis not present

## 2017-08-02 DIAGNOSIS — H409 Unspecified glaucoma: Secondary | ICD-10-CM | POA: Diagnosis not present

## 2017-08-02 DIAGNOSIS — I480 Paroxysmal atrial fibrillation: Secondary | ICD-10-CM | POA: Diagnosis not present

## 2017-08-02 DIAGNOSIS — Z7984 Long term (current) use of oral hypoglycemic drugs: Secondary | ICD-10-CM | POA: Diagnosis not present

## 2017-09-24 DIAGNOSIS — I480 Paroxysmal atrial fibrillation: Secondary | ICD-10-CM | POA: Diagnosis not present

## 2017-09-24 DIAGNOSIS — E785 Hyperlipidemia, unspecified: Secondary | ICD-10-CM | POA: Diagnosis not present

## 2017-09-24 DIAGNOSIS — Z7984 Long term (current) use of oral hypoglycemic drugs: Secondary | ICD-10-CM | POA: Diagnosis not present

## 2017-09-24 DIAGNOSIS — C61 Malignant neoplasm of prostate: Secondary | ICD-10-CM | POA: Diagnosis not present

## 2017-09-24 DIAGNOSIS — I10 Essential (primary) hypertension: Secondary | ICD-10-CM | POA: Diagnosis not present

## 2017-09-24 DIAGNOSIS — H409 Unspecified glaucoma: Secondary | ICD-10-CM | POA: Diagnosis not present

## 2017-09-24 DIAGNOSIS — E119 Type 2 diabetes mellitus without complications: Secondary | ICD-10-CM | POA: Diagnosis not present

## 2017-09-27 ENCOUNTER — Telehealth: Payer: Self-pay | Admitting: Cardiology

## 2017-09-27 NOTE — Telephone Encounter (Signed)
Faxed back to number provided via epic.

## 2017-09-27 NOTE — Telephone Encounter (Signed)
Spoke with Dr. Mingo Amber office - one wisdom extraction.   We recommend against holding anticoagulation with one dental extraction.

## 2017-09-27 NOTE — Telephone Encounter (Signed)
New message    1. What dental office are you calling from? Dr Feliberto Harts  2. What is your office phone and fax number? Fax # [phone], Phone [phone]  3. What type of procedure is the patient having performed? Wisdom tooth extraction  4. What date is procedure scheduled or is the patient there now? TBD  What is your question (ex. Antibiotics prior to procedure, holding medication-we need to know how long dentist wants pt to hold med)? rivaroxaban (XARELTO) 20 MG TABS tablet

## 2017-09-27 NOTE — Telephone Encounter (Signed)
Routing to pharmacy.  Rosia Syme C. Kelicia Youtz, RN, ANP-C [facility] [address] [place], [place]  [address] [phone]  

## 2017-09-29 DIAGNOSIS — H401122 Primary open-angle glaucoma, left eye, moderate stage: Secondary | ICD-10-CM | POA: Diagnosis not present

## 2017-09-29 DIAGNOSIS — H401112 Primary open-angle glaucoma, right eye, moderate stage: Secondary | ICD-10-CM | POA: Diagnosis not present

## 2017-09-29 DIAGNOSIS — H04123 Dry eye syndrome of bilateral lacrimal glands: Secondary | ICD-10-CM | POA: Diagnosis not present

## 2017-10-05 ENCOUNTER — Telehealth: Payer: Self-pay | Admitting: Cardiology

## 2017-10-05 NOTE — Telephone Encounter (Signed)
Follow up    [place] with Dr. Mingo Amber office calling to check on clearance for Xarelto, she states they never received a fax. Please call  Fax: [phone]

## 2017-10-06 NOTE — Telephone Encounter (Signed)
   Primary Cardiologist: Candee Furbish, MD  Chart reviewed as part of pre-operative protocol coverage.   Please see original phone note dated 09/27/17.  I will send a letter with the recommendations from our clinical pharmacist to the dentist.  Please call the dentist's office to ensure the letter was received. [Dr. Feliberto Harts; phone [phone].  Phone note will be removed from preop pool.   Richardson Dopp, PA-C 10/06/2017, 2:51 PM

## 2017-10-06 NOTE — Telephone Encounter (Signed)
CLEARANCE LETTER RECV'D. I called the dental office who confirm they did receive clearance letter. I will remove from call back pool.

## 2017-11-02 DIAGNOSIS — Z7984 Long term (current) use of oral hypoglycemic drugs: Secondary | ICD-10-CM | POA: Diagnosis not present

## 2017-11-02 DIAGNOSIS — I480 Paroxysmal atrial fibrillation: Secondary | ICD-10-CM | POA: Diagnosis not present

## 2017-11-02 DIAGNOSIS — E7800 Pure hypercholesterolemia, unspecified: Secondary | ICD-10-CM | POA: Diagnosis not present

## 2017-11-02 DIAGNOSIS — I714 Abdominal aortic aneurysm, without rupture: Secondary | ICD-10-CM | POA: Diagnosis not present

## 2017-11-02 DIAGNOSIS — F17210 Nicotine dependence, cigarettes, uncomplicated: Secondary | ICD-10-CM | POA: Diagnosis not present

## 2017-11-02 DIAGNOSIS — E119 Type 2 diabetes mellitus without complications: Secondary | ICD-10-CM | POA: Diagnosis not present

## 2017-11-02 DIAGNOSIS — H409 Unspecified glaucoma: Secondary | ICD-10-CM | POA: Diagnosis not present

## 2017-11-02 DIAGNOSIS — Z125 Encounter for screening for malignant neoplasm of prostate: Secondary | ICD-10-CM | POA: Diagnosis not present

## 2017-11-02 DIAGNOSIS — I10 Essential (primary) hypertension: Secondary | ICD-10-CM | POA: Diagnosis not present

## 2017-11-02 DIAGNOSIS — I359 Nonrheumatic aortic valve disorder, unspecified: Secondary | ICD-10-CM | POA: Diagnosis not present

## 2017-12-20 ENCOUNTER — Other Ambulatory Visit: Payer: Self-pay | Admitting: Cardiology

## 2017-12-20 NOTE — Telephone Encounter (Signed)
Scr 0.83 in [facility] from 11/02/17.

## 2017-12-30 DIAGNOSIS — E119 Type 2 diabetes mellitus without complications: Secondary | ICD-10-CM | POA: Diagnosis not present

## 2017-12-30 DIAGNOSIS — C61 Malignant neoplasm of prostate: Secondary | ICD-10-CM | POA: Diagnosis not present

## 2017-12-30 DIAGNOSIS — Z7984 Long term (current) use of oral hypoglycemic drugs: Secondary | ICD-10-CM | POA: Diagnosis not present

## 2017-12-30 DIAGNOSIS — I10 Essential (primary) hypertension: Secondary | ICD-10-CM | POA: Diagnosis not present

## 2017-12-30 DIAGNOSIS — H409 Unspecified glaucoma: Secondary | ICD-10-CM | POA: Diagnosis not present

## 2017-12-30 DIAGNOSIS — E785 Hyperlipidemia, unspecified: Secondary | ICD-10-CM | POA: Diagnosis not present

## 2017-12-30 DIAGNOSIS — I480 Paroxysmal atrial fibrillation: Secondary | ICD-10-CM | POA: Diagnosis not present

## 2018-02-21 DIAGNOSIS — C61 Malignant neoplasm of prostate: Secondary | ICD-10-CM | POA: Diagnosis not present

## 2018-02-21 DIAGNOSIS — Z7984 Long term (current) use of oral hypoglycemic drugs: Secondary | ICD-10-CM | POA: Diagnosis not present

## 2018-02-21 DIAGNOSIS — H409 Unspecified glaucoma: Secondary | ICD-10-CM | POA: Diagnosis not present

## 2018-02-21 DIAGNOSIS — E119 Type 2 diabetes mellitus without complications: Secondary | ICD-10-CM | POA: Diagnosis not present

## 2018-02-21 DIAGNOSIS — E785 Hyperlipidemia, unspecified: Secondary | ICD-10-CM | POA: Diagnosis not present

## 2018-02-21 DIAGNOSIS — I480 Paroxysmal atrial fibrillation: Secondary | ICD-10-CM | POA: Diagnosis not present

## 2018-02-21 DIAGNOSIS — I10 Essential (primary) hypertension: Secondary | ICD-10-CM | POA: Diagnosis not present

## 2018-03-26 ENCOUNTER — Other Ambulatory Visit: Payer: Self-pay | Admitting: Cardiology

## 2018-03-30 DIAGNOSIS — I480 Paroxysmal atrial fibrillation: Secondary | ICD-10-CM | POA: Diagnosis not present

## 2018-03-30 DIAGNOSIS — E7841 Elevated Lipoprotein(a): Secondary | ICD-10-CM | POA: Diagnosis not present

## 2018-03-30 DIAGNOSIS — H409 Unspecified glaucoma: Secondary | ICD-10-CM | POA: Diagnosis not present

## 2018-03-30 DIAGNOSIS — C61 Malignant neoplasm of prostate: Secondary | ICD-10-CM | POA: Diagnosis not present

## 2018-03-30 DIAGNOSIS — E119 Type 2 diabetes mellitus without complications: Secondary | ICD-10-CM | POA: Diagnosis not present

## 2018-03-30 DIAGNOSIS — I10 Essential (primary) hypertension: Secondary | ICD-10-CM | POA: Diagnosis not present

## 2018-03-30 DIAGNOSIS — E139 Other specified diabetes mellitus without complications: Secondary | ICD-10-CM | POA: Diagnosis not present

## 2018-04-06 DIAGNOSIS — Z23 Encounter for immunization: Secondary | ICD-10-CM | POA: Diagnosis not present

## 2018-04-19 DIAGNOSIS — H401112 Primary open-angle glaucoma, right eye, moderate stage: Secondary | ICD-10-CM | POA: Diagnosis not present

## 2018-04-19 DIAGNOSIS — E119 Type 2 diabetes mellitus without complications: Secondary | ICD-10-CM | POA: Diagnosis not present

## 2018-04-19 DIAGNOSIS — H401122 Primary open-angle glaucoma, left eye, moderate stage: Secondary | ICD-10-CM | POA: Diagnosis not present

## 2018-04-19 DIAGNOSIS — H1859 Other hereditary corneal dystrophies: Secondary | ICD-10-CM | POA: Diagnosis not present

## 2018-05-05 ENCOUNTER — Encounter: Payer: Self-pay | Admitting: Family

## 2018-05-05 ENCOUNTER — Ambulatory Visit (HOSPITAL_COMMUNITY)
Admission: RE | Admit: 2018-05-05 | Discharge: 2018-05-05 | Disposition: A | Discharge status: Home / Self Care | Payer: Medicare Other | Source: Ambulatory Visit | Attending: Internal Medicine | Admitting: Internal Medicine

## 2018-05-05 ENCOUNTER — Ambulatory Visit (INDEPENDENT_AMBULATORY_CARE_PROVIDER_SITE_OTHER): Payer: Medicare Other | Admitting: Family

## 2018-05-05 VITALS — BP 115/74 | HR 67 | Temp 97.1°F | Resp 16 | Ht 69.0 in | Wt 189.0 lb

## 2018-05-05 DIAGNOSIS — I714 Abdominal aortic aneurysm, without rupture, unspecified: Secondary | ICD-10-CM

## 2018-05-05 DIAGNOSIS — Z95828 Presence of other vascular implants and grafts: Secondary | ICD-10-CM | POA: Insufficient documentation

## 2018-05-05 DIAGNOSIS — F17200 Nicotine dependence, unspecified, uncomplicated: Secondary | ICD-10-CM
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
## 2018-05-31 DIAGNOSIS — E119 Type 2 diabetes mellitus without complications: Secondary | ICD-10-CM | POA: Diagnosis not present

## 2018-05-31 DIAGNOSIS — Z7984 Long term (current) use of oral hypoglycemic drugs: Secondary | ICD-10-CM | POA: Diagnosis not present

## 2018-05-31 DIAGNOSIS — I10 Essential (primary) hypertension: Secondary | ICD-10-CM | POA: Diagnosis not present

## 2018-05-31 DIAGNOSIS — C61 Malignant neoplasm of prostate: Secondary | ICD-10-CM | POA: Diagnosis not present

## 2018-05-31 DIAGNOSIS — I480 Paroxysmal atrial fibrillation: Secondary | ICD-10-CM | POA: Diagnosis not present

## 2018-05-31 DIAGNOSIS — H409 Unspecified glaucoma: Secondary | ICD-10-CM | POA: Diagnosis not present

## 2018-05-31 DIAGNOSIS — E7841 Elevated Lipoprotein(a): Secondary | ICD-10-CM | POA: Diagnosis not present

## 2018-06-06 DIAGNOSIS — Z1389 Encounter for screening for other disorder: Secondary | ICD-10-CM | POA: Diagnosis not present

## 2018-06-06 DIAGNOSIS — Z0000 Encounter for general adult medical examination without abnormal findings: Secondary | ICD-10-CM | POA: Diagnosis not present

## 2018-06-06 DIAGNOSIS — I714 Abdominal aortic aneurysm, without rupture: Secondary | ICD-10-CM | POA: Diagnosis not present

## 2018-06-06 DIAGNOSIS — E1169 Type 2 diabetes mellitus with other specified complication: Secondary | ICD-10-CM | POA: Diagnosis not present

## 2018-06-06 DIAGNOSIS — I10 Essential (primary) hypertension: Secondary | ICD-10-CM | POA: Diagnosis not present

## 2018-06-06 DIAGNOSIS — I359 Nonrheumatic aortic valve disorder, unspecified: Secondary | ICD-10-CM | POA: Diagnosis not present

## 2018-06-06 DIAGNOSIS — H409 Unspecified glaucoma: Secondary | ICD-10-CM | POA: Diagnosis not present

## 2018-06-06 DIAGNOSIS — I480 Paroxysmal atrial fibrillation: Secondary | ICD-10-CM | POA: Diagnosis not present

## 2018-06-06 DIAGNOSIS — F419 Anxiety disorder, unspecified: Secondary | ICD-10-CM | POA: Diagnosis not present

## 2018-06-06 DIAGNOSIS — Z7984 Long term (current) use of oral hypoglycemic drugs: Secondary | ICD-10-CM | POA: Diagnosis not present

## 2018-06-06 DIAGNOSIS — E7800 Pure hypercholesterolemia, unspecified: Secondary | ICD-10-CM | POA: Diagnosis not present

## 2018-06-30 ENCOUNTER — Encounter: Payer: Self-pay | Admitting: Cardiology

## 2018-07-22 ENCOUNTER — Encounter: Payer: Self-pay | Admitting: Cardiology

## 2018-07-22 ENCOUNTER — Ambulatory Visit (INDEPENDENT_AMBULATORY_CARE_PROVIDER_SITE_OTHER): Payer: Medicare Other | Admitting: Cardiology

## 2018-07-22 VITALS — BP 140/80 | HR 76 | Ht 69.0 in | Wt 187.1 lb

## 2018-07-22 DIAGNOSIS — I483 Typical atrial flutter: Secondary | ICD-10-CM

## 2018-07-22 DIAGNOSIS — I714 Abdominal aortic aneurysm, without rupture, unspecified: Secondary | ICD-10-CM

## 2018-07-22 DIAGNOSIS — H539 Unspecified visual disturbance: Secondary | ICD-10-CM | POA: Diagnosis not present

## 2018-07-22 DIAGNOSIS — I350 Nonrheumatic aortic (valve) stenosis: Secondary | ICD-10-CM | POA: Diagnosis not present

## 2018-07-22 DIAGNOSIS — Z95828 Presence of other vascular implants and grafts: Secondary | ICD-10-CM | POA: Diagnosis not present

## 2018-07-22 NOTE — Progress Notes (Addendum)
[facility]. [address], [address], [place]  [address] Phone: [phone] Fax:  [phone]  Date:  07/22/2018   ID:  Vincent Velez, DOB 1941-03-19, MRN [id number]  PCP:  [name], MD   History of Present Illness: Vincent Velez is a 78 y.o. male with prior moderate aortic stenosis, abdominal aortic aneurysm s/p EVAR (04/2009), for follow up of AFIB, discovered April 2017 possibly beginning in January/February 2017 after a flight to [facility] to help his sister. Failed cardioversion. Low-dose metoprolol is adequately rate control him.  Stress test was performed in November 2010 showing a small area of possible ischemia in the apical lateral segment. We agreed on medical management and optimizing his risk factor modification. His LDL is excellent. He also has a history of stent graft to his abdominal aorta 2010 for which Dr. Oneida Alar follows. He has not been having any abdominal pain.   Quit smoking 07/2014. Allergies. Seems to be back to smoking cigars.  07/22/2018-here today for atrial fibrillation follow-up.  Aortic stenosis follow-up.  He has been experiencing some increasing fatigue, dyspnea at times.  Yesterday he did feel an episode of chest discomfort with left arm squeezing as well at rest.  Took an aspirin.  Resolved.  In 2017 had a nuclear stress test that did not show any large areas of ischemia.  His aortic valve was moderate on prior echocardiogram about 3 years ago.  I think it is time to take another look.  Denies any fevers chills nausea vomiting syncope.  Has a beautiful Gabrielle Dare that he is originally on since 1969.   Wt Readings from Last 3 Encounters:  07/22/18 187 lb 1.9 oz (84.9 kg)  05/05/18 189 lb (85.7 kg)  04/30/17 191 lb 14.4 oz (87 kg)     Past Medical History:  Diagnosis Date  . AAA (abdominal aortic aneurysm) ([place])   . Allergy   . Allergy, unspecified not elsewhere classified   . Anxiety   . Arthritis   . Arthropathy, unspecified, site  unspecified   . Asthma    when younger  . Atrial fibrillation ([place])   . CAD (coronary artery disease)   . Cataract    removed both  . COPD (chronic obstructive pulmonary disease) ([place])   . Diabetes mellitus   . Esophageal reflux   . Family history of malignant neoplasm of gastrointestinal tract   . Heart murmur   . Hiatal hernia   . Hyperlipemia   . Hypertension   . Malignant neoplasm of prostate ([place])    prostate   . Peripheral vascular disease ([place])   . Stricture and stenosis of esophagus     Past Surgical History:  Procedure Laterality Date  . ABDOMINAL AORTA STENT    . CARDIOVERSION N/A 12/04/2015   Procedure: CARDIOVERSION;  Surgeon: Sanda Klein, MD;  Location: [facility];  Service: Cardiovascular;  Laterality: N/A;  . COLONOSCOPY    . FINGER SURGERY    . INSERTION PROSTATE RADIATION SEED    . UPPER GASTROINTESTINAL ENDOSCOPY      Current Outpatient Medications  Medication Sig Dispense Refill  . DORZOLAMIDE HCL OP Apply to eye. Apply one drop each eye three times a day    . esomeprazole (NEXIUM) 40 MG capsule Take 40 mg by mouth daily at 12 noon.    Marland Kitchen glimepiride (AMARYL) 1 MG tablet Take 1 mg by mouth daily before breakfast.      . metFORMIN (GLUCOPHAGE-XR) 500 MG 24  hr tablet Take 500 mg by mouth 2 (two) times daily.    . metoprolol succinate (TOPROL-XL) 25 MG 24 hr tablet Take 1 tablet (25 mg total) by mouth daily.    . simvastatin (ZOCOR) 40 MG tablet Take 40 mg by mouth at bedtime.      Alveda Reasons 20 MG TABS tablet TAKE 1 TABLET BY MOUTH  DAILY WITH SUPPER 90 tablet 1   Current Facility-Administered Medications  Medication Dose Route Frequency Provider Last Rate Last Dose  . 0.9 %  sodium chloride infusion  500 mL Intravenous Continuous Irene Shipper, MD        Allergies:   No Known Allergies  Social History:  The patient  reports that he has been smoking cigars. He has never used smokeless tobacco. He reports that he does not drink alcohol or use drugs.     ROS:  Please see the history of present illness.   Denies syncope, bleeding, or anemia  PHYSICAL EXAM: VS:  BP 140/80   Pulse 76   Ht 5\' 9"  (1.753 m)   Wt 187 lb 1.9 oz (84.9 kg)   SpO2 98%   BMI 27.63 kg/m  GEN: Well nourished, well developed, in no acute distress  HEENT: normal  Neck: no JVD, carotid bruits, or masses Cardiac: Irregularly irregular; 3/6 systolic murmur,no rubs, or gallops,no edema  Respiratory:  clear to auscultation bilaterally, normal work of breathing GI: soft, nontender, nondistended, + BS MS: no deformity or atrophy  Skin: warm and dry, no rash Neuro:  Alert and Oriented x 3, Strength and sensation are intact Psych: euthymic mood, full affect   EKG:  EKG was ordered today.  07/22/2018-atrial flutter 76 with variable conduction.  Typical flutter noted.  10/21/15-atrial fibrillation heart rate 104 with rapid ventricular response,PVC noted, nonspecific ST-T wave changes. Personally viewed. 09/18/14-sinus bradycardia rate 58 with nonspecific ST-T wave changes-prior 09/15/13-Sinus rhythm, PVCs   ECHO 2015 -  1. Normal LV size and function. 2. Left ventricular ejection fraction estimated by 2D at 60-65 percent. 3. There were no regional wall motion abnormalities. 4. Trace mitral valve regurgitation. 5. Trivial tricuspid regurgitation. 6. Moderate calcification of the aortic valve. 7. Moderate aortic valve stenosis. Peak velocity 2.36m/s, mean gradient 84mmHg. Advanced from mild 2010. 8. Mild aortic valve regurgitation. 9. There is mild aortic root dilatation. 4.1cm ascending root. 10. Analysis of mitral valve inflow, pulmonary vein Doppler and tissue Doppler suggests grade I diastolic dysfunction without elevated left atrial pressure.  ECHO 10/18/15 - Left ventricle: The cavity size was normal. Systolic function was  mildly reduced. The estimated ejection fraction was in the range  of 45% to 50%. Diffuse hypokinesis. - Aortic valve: Valve mobility was  restricted. There was moderate  stenosis. There was moderate regurgitation. Peak velocity (S):  338 cm/s. Mean gradient (S): 25 mm Hg. - Left atrium: The atrium was moderately dilated. - Right ventricle: The cavity size was mildly dilated. Wall  thickness was normal.  Impressions:  - Aortic stenosis has mildly advanced since prior study.  NUC Stress: 12/31/15:  Nuclear stress EF: 39%.  No T wave inversion was noted during stress.  There was no ST segment deviation noted during stress.  Defect 1: There is a small defect of moderate severity present in the apical lateral and apex location.  This is an intermediate risk study.   Small size, moderate intensity fixed apical/apiacal lateral perfusion defect - could be scar or artifact. Dilated LV with global hypokinesis. LVEF 39%.  This is an intermediate risk study.   ASSESSMENT AND PLAN:  1. Atrial fibrillation/flutter - paroxysmal - possibly started 07/2015. Cardioversion was successful but he went back in atrial fibrillation/flutter after a short time. Nuclear stress test showed ejection fraction of 39% as above. Echocardiogram showed 45%.  CHADS-VASc (1 diabetes, 1 age, 1 PVD (AAA)-3 total.  2. Bradycardia- low-dose beta blocker for rate control. Originally, atrial fibrillation was 104 bpm. Currently below 100. Well-controlled. 3. Aortic stenosis-prior moderate severity. Echocardiogram showed peak velocity of 3.4 m/s, mean gradient of 25 mmHg, EF 45-50% 2017. Mildly progressed.  Continues to have some shortness of breath with activity and in fact has had some chest discomfort left arm discomfort that can be quite intense and goes away at rest.  I will check repeat echocardiogram.  I wonder if his aortic stenosis has progressed.   4. S/p AAA -4.27 cm Dr. Oneida Alar. Doing well. No complaints. 5. Hyperlipidemia-on statin.  No changes made.  Continue with aggressive secondary risk factor prevention. 6. Tobacco cessation-great job on  quitting February 2016. Cigars, pipe.  No changes 7. Visual changes-felt some eye fragmentation he states vision changes.  I want to go ahead and check carotid Dopplers to ensure that there is no significant stenoses. 8. We will see him back in 6 months  Signed, Candee Furbish, MD [facility]  07/22/2018 5:22 PM   Addendum: 08/05/18 - ECHO with severe AS. Carotids mild Bilateral dz.  Summary: Right Carotid: Velocities in the right ICA are consistent with a 1-39% stenosis.  Left Carotid: Velocities in the left ICA are consistent with a 1-39% stenosis.  Vertebrals:  Bilateral vertebral arteries demonstrate antegrade flow. Subclavians: Normal flow hemodynamics were seen in bilateral subclavian              arteries  Abdominal Aorta: Patent endovascular aneurysm repair with no evidence of endoleak. Previous diameter 4.5 x 4.5 cm 04/15/2017.   Will refer to valve clinic.   Candee Furbish, MD

## 2018-07-22 NOTE — Patient Instructions (Signed)
Medication Instructions:  The current medical regimen is effective;  continue present plan and medications.  If you need a refill on your cardiac medications before your next appointment, please call your pharmacy.   Testing/Procedures: Your physician has requested that you have an echocardiogram. Echocardiography is a painless test that uses sound waves to create images of your heart. It provides your doctor with information about the size and shape of your heart and how well your heart's chambers and valves are working. This procedure takes approximately one hour. There are no restrictions for this procedure.  Your physician has requested that you have a carotid duplex. This test is an ultrasound of the carotid arteries in your neck. It looks at blood flow through these arteries that supply the brain with blood. Allow one hour for this exam. There are no restrictions or special instructions.  Follow-Up: At [facility], you and your health needs are our priority.  As part of our continuing mission to provide you with exceptional heart care, we have created designated Provider Care Teams.  These Care Teams include your primary Cardiologist (physician) and Advanced Practice Providers (APPs -  Physician Assistants and Nurse Practitioners) who all work together to provide you with the care you need, when you need it. You will need a follow up appointment in 6 months.  Please call our office 2 months in advance to schedule this appointment.  You may see Candee Furbish, MD or one of the following Advanced Practice Providers on your designated Care Team:   Truitt Merle, NP Cecilie Kicks, NP . Kathyrn Drown, NP  Thank you for choosing [facility]!!

## 2018-08-04 ENCOUNTER — Ambulatory Visit (HOSPITAL_COMMUNITY)
Admission: RE | Admit: 2018-08-04 | Discharge: 2018-08-04 | Disposition: A | Discharge status: Home / Self Care | Payer: Medicare Other | Source: Ambulatory Visit | Attending: Cardiology | Admitting: Cardiology

## 2018-08-04 ENCOUNTER — Ambulatory Visit (HOSPITAL_BASED_OUTPATIENT_CLINIC_OR_DEPARTMENT_OTHER): Payer: Medicare Other

## 2018-08-04 DIAGNOSIS — I350 Nonrheumatic aortic (valve) stenosis: Secondary | ICD-10-CM

## 2018-08-04 DIAGNOSIS — H539 Unspecified visual disturbance: Secondary | ICD-10-CM | POA: Insufficient documentation

## 2018-08-05 ENCOUNTER — Telehealth: Payer: Self-pay

## 2018-08-05 NOTE — Telephone Encounter (Signed)
Notes recorded by Frederik Schmidt, RN on 08/05/2018 at 1:56 PM EST The patient has been notified of the result and verbalized understanding. All questions (if any) were answered. Frederik Schmidt, RN 08/05/2018 1:56 PM   ------

## 2018-08-05 NOTE — Telephone Encounter (Signed)
-----   Message from Jerline Pain, MD sent at 08/05/2018  1:35 PM EST ----- Mild bilateral carotid plaque. No need for further study. Candee Furbish, MD

## 2018-08-08 ENCOUNTER — Encounter: Payer: Self-pay | Admitting: Physician Assistant

## 2018-08-10 ENCOUNTER — Ambulatory Visit (INDEPENDENT_AMBULATORY_CARE_PROVIDER_SITE_OTHER): Payer: Medicare Other | Admitting: Cardiovascular Disease

## 2018-08-10 ENCOUNTER — Other Ambulatory Visit: Payer: Self-pay

## 2018-08-10 ENCOUNTER — Encounter: Payer: Self-pay | Admitting: Cardiovascular Disease

## 2018-08-10 VITALS — BP 126/80 | HR 78 | Ht 69.0 in | Wt 187.4 lb

## 2018-08-10 DIAGNOSIS — I350 Nonrheumatic aortic (valve) stenosis: Secondary | ICD-10-CM | POA: Diagnosis not present

## 2018-08-10 NOTE — Progress Notes (Signed)
Chief Complaint  Patient presents with  . Follow-up    severe aortic stenosis    History of Present Illness:78 yo male with history of AAA, arthritis, persistent atrial fibrillation/flutter, CAD, COPD, DM, GERD, HTN, hyperlipidemia, prostate cancer and severe aortic stenosis who is here today in the valve clinic as a new consult, referred by Dr. Marlou Porch for further evaluation of his aortic stenosis and discussion regarding TAVR. His aortic stenosis has been moderate and has been followed for several years by Dr. Marlou Porch. Echo March 2020 with mild LV systolic dysfunction, NWGN=56-21%. The aortic valve leaflets are thickened and calcified with mean gradient of 41 mmg, peak gradient 61 mmHg, AVA 0.60 cm2, DVI 0.19. This is consistent with severe aortic stenosis. He has undergone EVAR for AAA in 2010. He has had persistent atrial fibrillation and flutter since 2017. He has been on Xarelto. He quit smoking cigars in 2016 but smokes a pipe daily now. He has no known CAD.   He has recently noticed increased fatigue and dyspnea with exertion. He has had several episodes of chest pain. He denies near syncope, syncope, lower extremity edema, weight gain. He has had some dizziness and blurred vision. He sees a dentist regularly and has had recent dental work. He is retired as an Quarry manager. He lives with his wife.   Primary Care Physician: [name], MD Primary Cardiologist: Dr. Marlou Porch Referring Cardiologist: Dr. Marlou Porch  Past Medical History:  Diagnosis Date  . AAA (abdominal aortic aneurysm) ([place])   . Allergy, unspecified not elsewhere classified   . Anxiety   . Arthritis   . Arthropathy, unspecified, site unspecified   . Asthma    when younger  . Atrial fibrillation ([place])   . CAD (coronary artery disease)   . Cataract    removed both  . COPD (chronic obstructive pulmonary disease) ([place])   . Diabetes mellitus   . Esophageal reflux   . Family history of malignant neoplasm of  gastrointestinal tract   . Hiatal hernia   . Hyperlipemia   . Hypertension   . Malignant neoplasm of prostate ([place])    prostate   . Peripheral vascular disease ([place])   . Stricture and stenosis of esophagus     Past Surgical History:  Procedure Laterality Date  . ABDOMINAL AORTA STENT    . CARDIOVERSION N/A 12/04/2015   Procedure: CARDIOVERSION;  Surgeon: Sanda Klein, MD;  Location: [facility];  Service: Cardiovascular;  Laterality: N/A;  . COLONOSCOPY    . FINGER SURGERY    . INSERTION PROSTATE RADIATION SEED    . UPPER GASTROINTESTINAL ENDOSCOPY      Current Outpatient Medications  Medication Sig Dispense Refill  . DORZOLAMIDE HCL OP Apply to eye. Apply one drop each eye three times a day    . esomeprazole (NEXIUM) 40 MG capsule Take 40 mg by mouth daily at 12 noon.    Marland Kitchen glimepiride (AMARYL) 1 MG tablet Take 1 mg by mouth daily before breakfast.      . metFORMIN (GLUCOPHAGE-XR) 500 MG 24 hr tablet Take 500 mg by mouth 2 (two) times daily.    . metoprolol succinate (TOPROL-XL) 25 MG 24 hr tablet Take 1 tablet (25 mg total) by mouth daily.    . simvastatin (ZOCOR) 40 MG tablet Take 40 mg by mouth at bedtime.      Alveda Reasons 20 MG TABS tablet TAKE 1 TABLET BY MOUTH  DAILY WITH SUPPER 90 tablet 1   Current Facility-Administered Medications  Medication Dose Route Frequency Provider Last Rate Last Dose  . 0.9 %  sodium chloride infusion  500 mL Intravenous Continuous Irene Shipper, MD        No Known Allergies  Social History   Socioeconomic History  . Marital status: Married    Spouse name: Not on file  . Number of children: 2  . Years of education: Not on file  . Highest education level: Not on file  Occupational History  . Occupation: engineer-retired    Employer: [employer]  . Financial resource strain: Not on file  . Food insecurity:    Worry: Not on file    Inability: Not on file  . Transportation needs:    Medical: Not on file    Non-medical:  Not on file  Tobacco Use  . Smoking status: Light Tobacco Smoker    Types: Pipe, Cigars  . Smokeless tobacco: Never Used  Substance and Sexual Activity  . Alcohol use: No    Alcohol/week: 0.0 standard drinks  . Drug use: No  . Sexual activity: Not on file  Lifestyle  . Physical activity:    Days per week: Not on file    Minutes per session: Not on file  . Stress: Not on file  Relationships  . Social connections:    Talks on phone: Not on file    Gets together: Not on file    Attends religious service: Not on file    Active member of club or organization: Not on file    Attends meetings of clubs or organizations: Not on file    Relationship status: Not on file  . Intimate partner violence:    Fear of current or ex partner: Not on file    Emotionally abused: Not on file    Physically abused: Not on file    Forced sexual activity: Not on file  Other Topics Concern  . Not on file  Social History Narrative  . Not on file    Family History  Problem Relation Age of Onset  . Colon cancer Father        questionable  . Diabetes Mother   . Heart disease Mother        Heart Disease before age 63  . Deep vein thrombosis Mother   . Hyperlipidemia Mother   . Hypertension Mother   . Varicose Veins Mother   . Hypertension Brother   . Heart attack Brother   . Prostate cancer Brother   . Colon polyps Neg Hx   . Esophageal cancer Neg Hx   . Rectal cancer Neg Hx   . Stomach cancer Neg Hx     Review of Systems:  As stated in the HPI and otherwise negative.   BP 126/80   Pulse 78   Ht 5\' 9"  (1.753 m)   Wt 187 lb 6.4 oz (85 kg)   SpO2 98%   BMI 27.67 kg/m   Physical Examination: General: Well developed, well nourished, NAD  HEENT: OP clear, mucus membranes moist  SKIN: warm, dry. No rashes. Neuro: No focal deficits  Musculoskeletal: Muscle strength 5/5 all ext  Psychiatric: Mood and affect normal  Neck: No JVD, no carotid bruits, no thyromegaly, no lymphadenopathy.    Lungs:Clear bilaterally, no wheezes, rhonci, crackles Cardiovascular: Irreg irreg. Loud, harsh systolic murmur.  Abdomen:Soft. Bowel sounds present. Non-tender.  Extremities: No lower extremity edema. Pulses are 2 + in the bilateral DP/PT.  Echo 08/04/18:  1. The left ventricle  has mildly reduced systolic function, with an ejection fraction of 45-50%. The cavity size was mildly dilated. Left ventricular diastolic parameters were normal Left ventrical global hypokinesis without regional wall motion  abnormalities.  2. The right ventricle has normal systolic function. The cavity was normal. There is no increase in right ventricular wall thickness.  3. Left atrial size was moderately dilated.  4. The mitral valve is normal in structure. Mild thickening of the mitral valve leaflet.  5. The tricuspid valve is normal in structure.  6. The aortic valve is tricuspid Severely thickening of the aortic valve Severe calcifcation of the aortic valve. Aortic valve regurgitation is mild by color flow Doppler. severe stenosis of the aortic valve.  7. Peak velocity from 3 chamber 3.9 m/sec mean gradient 41 mmHg peak 61 mmHg DVI .19 Severe AS especially given decreased EF and morphology.  8. The pulmonic valve was normal in structure.  9. 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  FINDINGS  Left Ventricle: The left ventricle has mildly reduced systolic function, with an ejection fraction of 45-50%. The cavity size was mildly dilated. There is no increase in left ventricular wall thickness. Left ventricular diastolic parameters were normal  Left ventrical global hypokinesis without regional wall motion abnormalities. Right Ventricle: The right ventricle has normal systolic function. The cavity was normal. There is no increase in right ventricular wall thickness. Left Atrium: left atrial size was moderately dilated Right Atrium: right atrial size was normal in size. Interatrial Septum: No  atrial level shunt detected by color flow Doppler. Pericardium: There is no evidence of pericardial effusion. Mitral Valve: The mitral valve is normal in structure. Mild thickening of the mitral valve leaflet. Mitral valve regurgitation is trivial by color flow Doppler. Tricuspid Valve: The tricuspid valve is normal in structure. Tricuspid valve regurgitation was not visualized by color flow Doppler. Aortic Valve: The aortic valve is tricuspid Severely thickening of the aortic valve Severe calcifcation of the aortic valve. Aortic valve regurgitation is mild by color flow Doppler. There is severe stenosis of the aortic valve, with a calculated valve  area of 0.58 cm. Peak velocity from 3 chamber 3.9 m/sec mean gradient 41 mmHg peak 61 mmHg DVI .19 Severe AS especially given decreased EF and morphology. Pulmonic Valve: The pulmonic valve was normal in structure. Pulmonic valve regurgitation is not visualized by color flow Doppler. 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 Venous: The inferior vena cava is normal in size with greater than 50% respiratory variability.   LEFT VENTRICLE PLAX 2D ([place]) LV EF:          36.0 %   Diastology LVIDd:          5.70 cm  LV e' lateral:   9.90 cm/s LVIDs:          4.70 cm  LV E/e' lateral: 8.5 LV PW:          0.70 cm  LV e' medial:    7.29 cm/s LV IVS:         1.00 cm  LV E/e' medial:  11.6 LVOT diam:      2.00 cm LV SV:          58 ml LVOT Area:      3.14 cm  RIGHT VENTRICLE RV Basal diam:  4.10 cm RV S prime:     11.70 cm/s  LEFT ATRIUM  Index       RIGHT ATRIUM           Index LA diam:        4.80 cm 2.39 cm/m  RA Area:     22.90 cm LA Vol (A2C):   76.9 ml 38.29 ml/m RA Volume:   82.00 ml  40.83 ml/m LA Vol (A4C):   84.3 ml 41.97 ml/m LA Biplane Vol: 81.4 ml 40.53 ml/m  AORTIC VALVE AV Area (Vmax):    0.67 cm AV Area (Vmean):   0.60 cm AV Area (VTI):     0.58 cm AV Vmax:           335.60  cm/s AV Vmean:          259.200 cm/s AV VTI:            0.755 m AV Peak Grad:      45.1 mmHg AV Mean Grad:      29.8 mmHg LVOT Vmax:         71.60 cm/s LVOT Vmean:        49.200 cm/s LVOT VTI:          0.140 m LVOT/AV VTI ratio: 0.19 AR PHT:            408 msec   AORTA Ao Root diam: 3.50 cm Ao Asc diam:  4.30 cm  MITRAL VALVE              TRICUSPID VALVE MV Area (PHT):            TR Peak grad:   21.5 mmHg MV PHT:                   TR Vmax:        233.00 cm/s MV Decel Time: 123 msec MV E velocity: 84.40 cm/s MV A velocity: 45.80 cm/s MV E/A ratio:  1.84  EKG:  EKG is ordered today. The ekg ordered today demonstrates Atrial flutter, rate 78 bpm. Non-specific ST and T wave abnormalities.   Recent Labs: No results found for requested labs within last 8760 hours.   Lipid Panel    Component Value Date/Time   CHOL 120 09/15/2013 1428   TRIG 110.0 09/15/2013 1428   HDL 37.70 (L) 09/15/2013 1428   CHOLHDL 3 09/15/2013 1428   VLDL 22.0 09/15/2013 1428   LDLCALC 60 09/15/2013 1428     Wt Readings from Last 3 Encounters:  08/10/18 187 lb 6.4 oz (85 kg)  07/22/18 187 lb 1.9 oz (84.9 kg)  05/05/18 189 lb (85.7 kg)     Other studies Reviewed: Additional studies/ records that were reviewed today include: office records, echo images Review of the above records demonstrates: severe AS   Assessment and Plan:   1. Severe aortic valve stenosis: He has severe, stage D aortic valve stenosis. I have personally reviewed his echo images today. The aortic valve is thickened, calcified with limited leaflet mobility. I think he would benefit from AVR. Given advanced age, we would consider TAVR although he would seem to be a candidate for surgical AVR or TAVR.    STS Risk Score:  Risk of Mortality: 1.259% Renal Failure: 1.310% Permanent Stroke: 0.671% Prolonged Ventilation: 5.017% DSW Infection: 0.138% Reoperation: 3.104% Morbidity or Mortality: 8.076% Short Length of Stay: 44.456% Long  Length of Stay: 4.158%  I have reviewed the natural history of aortic stenosis with the patient and their family members  who are present today. We have discussed the limitations  of medical therapy and the poor prognosis associated with symptomatic aortic stenosis. We have reviewed potential treatment options, including palliative medical therapy, conventional surgical aortic valve replacement, and transcatheter aortic valve replacement. We discussed treatment options in the context of the patient's specific comorbid medical conditions.   He would like to proceed with planning for TAVR. I will arrange a right and left heart catheterization at [facility] 08/19/18. Risks and benefits of the cath procedure and the TAVR procedure are reviewed with the patient. After the cath, he will have a gated cardiac CT, CTA of the chest/abdomen and pelvis, PFTs, carotid artery dopplers, PT assessment and will then be referred to see one of the CT surgeons on our TAVR team.   He will hold his Xarelto 2 days prior to his cardiac cath. Pre-cath labs today.     Current medicines are reviewed at length with the patient today.  The patient does not have concerns regarding medicines.  The following changes have been made:  no change  Labs/ tests ordered today include:   Orders Placed This Encounter  Procedures  . Basic Metabolic Panel (BMET)  . CBC  . EKG 12-Lead    Disposition:   FU with the TAVR team after his cath.    Signed, Lauree Chandler, MD 08/10/2018 2:58 PM    [facility] [place], [place],   [address] Phone: [phone]; Fax: [phone]

## 2018-08-10 NOTE — Patient Instructions (Signed)
Medication Instructions:  Your physician recommends that you continue on your current medications as directed. Please refer to the Current Medication list given to you today.  If you need a refill on your cardiac medications before your next appointment, please call your pharmacy.   Lab work: Lab work to be done today--BMP and CBC If you have labs (blood work) drawn today and your tests are completely normal, you will receive your results only by: Marland Kitchen MyChart Message (if you have MyChart) OR . A paper copy in the mail If you have any lab test that is abnormal or we need to change your treatment, we will call you to review the results.  Testing/Procedures: Your physician has requested that you have a cardiac catheterization. Cardiac catheterization is used to diagnose and/or treat various heart conditions. Doctors may recommend this procedure for a number of different reasons. The most common reason is to evaluate chest pain. Chest pain can be a symptom of coronary artery disease (CAD), and cardiac catheterization can show whether plaque is narrowing or blocking your heart's arteries. This procedure is also used to evaluate the valves, as well as measure the blood flow and oxygen levels in different parts of your heart. For further information please visit [facility]uge[facility]tn. Please follow instruction sheet, as given.  Scheduled for March 20,2020  Follow-Up: To be arranged after catheterization     [place] OFFICE [place], [place] [address] Dept: [phone] Loc: [place]  08/10/2018  You are scheduled for a Cardiac Catheterization on Friday, March 20 with Dr. Lauree Chandler.  1. Please arrive at the [facility] (Main Entrance A) at [facility]: [address] [place], [place] [address] at 10:00 AM (This time is two hours before your procedure to  ensure your preparation). Free valet parking service is available.   Special note: Every effort is made to have your procedure done on time. Please understand that emergencies sometimes delay scheduled procedures.  2. Diet: Do not eat solid foods after midnight.  The patient may have clear liquids until 5am upon the day of the procedure.  3. Labs: Lab work was done in office on March 11,2020  4. Medication instructions in preparation for your procedure:   Contrast Allergy: No  Stop Xarelto after evening dose on March 17,2020  Do not take glimepiride and metformin the morning of the procedure. Do not take metformin for 48 hours after the procedure.      On the morning of your procedure, take   Aspirin 81 mg and any morning medicines NOT listed above.  You may use sips of water.  5. Plan for one night stay--bring personal belongings. 6. Bring a current list of your medications and current insurance cards. 7. You MUST have a responsible person to drive you home. 8. Someone MUST be with you the first 24 hours after you arrive home or your discharge will be delayed. 9. Please wear clothes that are easy to get on and off and wear slip-on shoes.  Thank you for allowing [place] to care for you!   -- [place] [facility]

## 2018-08-11 LAB — CBC
HEMATOCRIT: 38.1 % (ref 37.5–51.0)
HEMOGLOBIN: 13.2 g/dL (ref 13.0–17.7)
MCH: 30.6 pg (ref 26.6–33.0)
MCHC: 34.6 g/dL (ref 31.5–35.7)
MCV: 88 fL (ref 79–97)
Platelets: 242 10*3/uL (ref 150–450)
RBC: 4.31 x10E6/uL (ref 4.14–5.80)
RDW: 12.8 % (ref 11.6–15.4)
WBC: 8 10*3/uL (ref 3.4–10.8)

## 2018-08-11 LAB — BASIC METABOLIC PANEL
BUN/Creatinine Ratio: 15 (ref 10–24)
BUN: 13 mg/dL (ref 8–27)
CALCIUM: 9.4 mg/dL (ref 8.6–10.2)
CO2: 21 mmol/L (ref 20–29)
CREATININE: 0.89 mg/dL (ref 0.76–1.27)
Chloride: 104 mmol/L (ref 96–106)
GFR calc Af Amer: 97 mL/min/{1.73_m2} (ref >59)
GFR, EST NON AFRICAN AMERICAN: 84 mL/min/{1.73_m2} (ref >59)
Glucose: 164 mg/dL — ABNORMAL HIGH (ref 65–99)
POTASSIUM: 4.5 mmol/L (ref 3.5–5.2)
Sodium: 140 mmol/L (ref 134–144)

## 2018-08-16 ENCOUNTER — Telehealth: Payer: Self-pay | Admitting: *Deleted

## 2018-08-16 DIAGNOSIS — E119 Type 2 diabetes mellitus without complications: Secondary | ICD-10-CM | POA: Diagnosis not present

## 2018-08-16 DIAGNOSIS — Z01812 Encounter for preprocedural laboratory examination: Secondary | ICD-10-CM

## 2018-08-16 DIAGNOSIS — H409 Unspecified glaucoma: Secondary | ICD-10-CM | POA: Diagnosis not present

## 2018-08-16 DIAGNOSIS — I350 Nonrheumatic aortic (valve) stenosis: Secondary | ICD-10-CM

## 2018-08-16 DIAGNOSIS — E139 Other specified diabetes mellitus without complications: Secondary | ICD-10-CM | POA: Diagnosis not present

## 2018-08-16 DIAGNOSIS — E1169 Type 2 diabetes mellitus with other specified complication: Secondary | ICD-10-CM | POA: Diagnosis not present

## 2018-08-16 DIAGNOSIS — I10 Essential (primary) hypertension: Secondary | ICD-10-CM | POA: Diagnosis not present

## 2018-08-16 DIAGNOSIS — I480 Paroxysmal atrial fibrillation: Secondary | ICD-10-CM | POA: Diagnosis not present

## 2018-08-16 DIAGNOSIS — C61 Malignant neoplasm of prostate: Secondary | ICD-10-CM | POA: Diagnosis not present

## 2018-08-16 DIAGNOSIS — E7849 Other hyperlipidemia: Secondary | ICD-10-CM | POA: Diagnosis not present

## 2018-08-16 NOTE — Telephone Encounter (Signed)
At patient's request I have rescheduled [facility] to 09/12/18. Pt will come to our office for BMP/CBC 09/07/18.  Pt has been instructed: Last dose of Xarelto 09/09/18. Hold metformin day of procedure and 48 hours post procedure. Hold glimepiride AM of procedure.

## 2018-09-06 ENCOUNTER — Telehealth: Payer: Self-pay | Admitting: *Deleted

## 2018-09-06 NOTE — Telephone Encounter (Signed)
Pt is scheduled for [facility] 09/12/18 Dr Lucienne Capers of aortic stenosis/TAVR. Due to Covid 19 pandemic, current [facility] policy is to postpone elective/non urgent procedures.  I called  patient to see how he had been feeling and get his thoughts about postponing [facility]. Pt states he does not have any new cardiac symptoms, including shortness of breath, and is able to do his usual level of activity, feels no changes in medical condition since office visit with Dr Angelena Form 08/10/18. Pt did note some dizziness, states this happens when he turns his head quickly. I advised patient to call our office if he felt dizziness/lightheadedness associated with feeling like he was going to pass out or if he did pass out.   Patient states he had been considering postponing procedure due to Covid 19 pandemic risks and agreed with cancelling at this time.  Pt is aware that it will probably be several weeks before [facility] will be rescheduled, knows to call our office if he has any new symptoms. Pt advised I will plan to touch base with him in a couple of weeks to see how he is doing and update him with any information about timing of rescheduling cath. Pt was comfortable with this plan, knows to call me if questions, thanked me for call.  Pt advised to continue current medications and I will forward to Dr Angelena Form for review/ recommendations.

## 2018-09-06 NOTE — Telephone Encounter (Signed)
Thank you Webb Silversmith.

## 2018-09-07 ENCOUNTER — Other Ambulatory Visit: Payer: Medicare Other

## 2018-09-07 NOTE — Telephone Encounter (Signed)
Thanks

## 2018-09-12 ENCOUNTER — Ambulatory Visit (HOSPITAL_COMMUNITY): Admission: RE | Admit: 2018-09-12 | Payer: Medicare Other | Source: Home / Self Care | Admitting: Cardiovascular Disease

## 2018-09-12 ENCOUNTER — Encounter (HOSPITAL_COMMUNITY): Admission: RE | Payer: Self-pay | Source: Home / Self Care

## 2018-09-12 SURGERY — RIGHT/LEFT HEART CATH AND CORONARY ANGIOGRAPHY
Anesthesia: LOCAL

## 2018-09-22 NOTE — Telephone Encounter (Addendum)
I spoke with patient to follow up with him about  rescheduling cardiac cath and to see how he had been doing. Pt states overall about the same, no increase in shortness of breath, activity level, although limited, about the same, dizziness about the same, did have one episode of severe dizziness he thought might be related to blood sugar. Pt is checking his BP, has been in 120/80 range, no complaints of fast heart rate.  Pt advised still probably several weeks for rescheduling cath, but I will plan to call him in a couple of weeks to update him with any information I may have. Pt knows to call our office/not delay care if any changes in symptoms/questions/concerns, knows Virtual Visit is an option.  Pt states he is comfortable with this plan, thanked me for call.   I will forward to Dr Angelena Form for review

## 2018-09-22 NOTE — Telephone Encounter (Signed)
Agree. Vincent Velez.

## 2018-10-10 NOTE — Telephone Encounter (Signed)
10/10/18-LMTCB for pt to discuss scheduling [facility].

## 2018-10-10 NOTE — Telephone Encounter (Signed)
I spoke with patient, will plan for [facility] May 27, Dr Angelena Form Pt aware I will follow up with him tomorrow with Covid testing/procedure details.

## 2018-10-10 NOTE — Telephone Encounter (Signed)
From: Burnell Blanks, MD  Sent: 10/06/2018   Subject: RE: [facility]-when to schedule             Let's go ahead and schedule him over the next two weeks. We are going to open up to 2 outpatients per doctor per day. Thanks, chris

## 2018-10-11 NOTE — Telephone Encounter (Addendum)
I spoke with Santiago Glad at [facility] lab, [facility] scheduled for Wednesday May 27,2020 Dr Angelena Form.  @[email or web address] @ [place] OFFICE [place], [place] [address] Dept: [phone] Loc: [place]  10/11/2018  You are scheduled for a Cardiac Catheterization on Wednesday, May 27 with Dr. Lauree Chandler.  1. Please arrive at the [facility] (Main Entrance A) at [facility]: [address] [place], [place] [address] at 8:00 AM (This time is two hours before your procedure to ensure your preparation). Free valet parking service is available.   Special note: Every effort is made to have your procedure done on time. Please understand that emergencies sometimes delay scheduled procedures.  2. Diet: Do not eat solid foods after midnight.  The patient may have clear liquids until 5am upon the day of the procedure.  3. Labs: You will need to have blood drawn at the hospital the morning of the procedure.  4. Medication instructions in preparation for your procedure:  -Do not take Xarelto starting Monday May 25 until after the procedure. -Do not take metformin the day of the procedure and 48 hours after the procedure. -Do not take glimepiride the morning of the procedure.  On the morning of your procedure, take Aspirin 81 mg and any morning medicines NOT listed above.  You may use sips of water.  5. Plan for one night stay--bring personal belongings. 6. Bring a current list of your medications and current insurance cards. 7. You MUST have a responsible person to drive you home. 8. Someone MUST be with you the first 24 hours after you arrive home or your discharge will be delayed. 9. Please wear clothes that are easy to get on and off and wear slip-on shoes.  Thank you for allowing [place] to care for you!   -- [place] [facility]

## 2018-10-11 NOTE — Telephone Encounter (Signed)
I spoke with patient, reviewed procedure instructions, scheduled Covid 19 test 10/21/18, pt verrbalized understanding

## 2018-10-11 NOTE — Telephone Encounter (Signed)
LMTCB to discuss instructions with patient and to schedule Covid test prior to procedure.

## 2018-10-11 NOTE — Telephone Encounter (Signed)
Patient returned your call.

## 2018-10-13 ENCOUNTER — Encounter: Payer: Self-pay | Admitting: *Deleted

## 2018-10-13 NOTE — Addendum Note (Signed)
Addended by: Lauree Chandler D on: 10/13/2018 09:39 AM   Modules accepted: Orders, SmartSet

## 2018-10-13 NOTE — Telephone Encounter (Signed)
Procedure instructions and Covid 19 testing appointment mailed to patient.

## 2018-10-20 ENCOUNTER — Telehealth: Payer: Self-pay | Admitting: *Deleted

## 2018-10-20 NOTE — Telephone Encounter (Signed)
Pt contacted pre-catheterization scheduled at [facility] for: Wednesday May 27,2020 10 AM Verified arrival time and place: [place] Entrance A at: 8 AM   Covid-19 test date: 10/21/18 [facility]   No solid food after midnight prior to cath, clear liquids until 5 AM day of procedure. Contrast allergy: no  Hold: Xarelto- 10/24/18 until post procedure. Metformin-day of procedure and 48 hours after procedure Glimepiride-AM of procedure  Except hold medications AM meds can be  taken pre-cath with sip of water including: ASA 81 mg   Confirmed patient has responsible person to drive home post procedure and observe 24 hours after arriving home: yes   Pt advised due to Covid-19 pandemic no visitors are allowed in the hospital. Their designated party will be called when their procedure is over for an update and to arrange pick up.        COVID-19 Pre-Screening Questions:  . In the past 7 to 10 days have you had a cough,  shortness of breath, headache, congestion, fever (100 or greater) body aches, chills, sore throat, or sudden loss of taste or sense of smell? no . Have you been around anyone with known Covid 19? no . Have you been around anyone who is awaiting Covid 19 test results in the past 7 to 10 days? no . Have you been around anyone who has been exposed to Covid 19, or has mentioned symptoms of Covid 19 within the past 7 to 10 days? no

## 2018-10-21 ENCOUNTER — Other Ambulatory Visit (HOSPITAL_COMMUNITY)
Admission: RE | Admit: 2018-10-21 | Discharge: 2018-10-21 | Disposition: A | Discharge status: Home / Self Care | Payer: Medicare Other | Source: Ambulatory Visit | Attending: Cardiovascular Disease | Admitting: Cardiovascular Disease

## 2018-10-21 DIAGNOSIS — I10 Essential (primary) hypertension: Secondary | ICD-10-CM | POA: Diagnosis not present

## 2018-10-21 DIAGNOSIS — I480 Paroxysmal atrial fibrillation: Secondary | ICD-10-CM | POA: Diagnosis not present

## 2018-10-21 DIAGNOSIS — E7849 Other hyperlipidemia: Secondary | ICD-10-CM | POA: Diagnosis not present

## 2018-10-21 DIAGNOSIS — H409 Unspecified glaucoma: Secondary | ICD-10-CM | POA: Diagnosis not present

## 2018-10-21 DIAGNOSIS — E119 Type 2 diabetes mellitus without complications: Secondary | ICD-10-CM | POA: Diagnosis not present

## 2018-10-21 DIAGNOSIS — Z1159 Encounter for screening for other viral diseases: Secondary | ICD-10-CM | POA: Insufficient documentation

## 2018-10-21 DIAGNOSIS — C61 Malignant neoplasm of prostate: Secondary | ICD-10-CM | POA: Diagnosis not present

## 2018-10-21 DIAGNOSIS — E1169 Type 2 diabetes mellitus with other specified complication: Secondary | ICD-10-CM | POA: Diagnosis not present

## 2018-10-21 DIAGNOSIS — E7800 Pure hypercholesterolemia, unspecified: Secondary | ICD-10-CM | POA: Diagnosis not present

## 2018-10-21 DIAGNOSIS — E139 Other specified diabetes mellitus without complications: Secondary | ICD-10-CM | POA: Diagnosis not present

## 2018-10-22 LAB — NOVEL CORONAVIRUS, NAA (HOSP ORDER, SEND-OUT TO REF LAB; TAT 18-24 HRS): SARS-CoV-2, NAA: NOT DETECTED

## 2018-10-25 ENCOUNTER — Other Ambulatory Visit: Payer: Self-pay | Admitting: Physician Assistant

## 2018-10-25 DIAGNOSIS — I350 Nonrheumatic aortic (valve) stenosis: Secondary | ICD-10-CM

## 2018-10-26 ENCOUNTER — Ambulatory Visit (HOSPITAL_COMMUNITY)
Admission: RE | Admit: 2018-10-26 | Discharge: 2018-10-26 | Disposition: A | Discharge status: Home / Self Care | Payer: Medicare Other | Attending: Cardiovascular Disease | Admitting: Cardiovascular Disease

## 2018-10-26 ENCOUNTER — Other Ambulatory Visit: Payer: Self-pay

## 2018-10-26 ENCOUNTER — Encounter: Payer: Self-pay | Admitting: Physician Assistant

## 2018-10-26 ENCOUNTER — Ambulatory Visit (HOSPITAL_COMMUNITY)
Admission: RE | Disposition: A | Discharge status: Home / Self Care | Payer: Self-pay | Source: Home / Self Care | Attending: Cardiovascular Disease

## 2018-10-26 ENCOUNTER — Encounter (HOSPITAL_COMMUNITY): Payer: Self-pay | Admitting: Cardiovascular Disease

## 2018-10-26 DIAGNOSIS — Z833 Family history of diabetes mellitus: Secondary | ICD-10-CM | POA: Insufficient documentation

## 2018-10-26 DIAGNOSIS — I4892 Unspecified atrial flutter: Secondary | ICD-10-CM | POA: Diagnosis not present

## 2018-10-26 DIAGNOSIS — E1151 Type 2 diabetes mellitus with diabetic peripheral angiopathy without gangrene: Secondary | ICD-10-CM | POA: Diagnosis not present

## 2018-10-26 DIAGNOSIS — Z79899 Other long term (current) drug therapy: Secondary | ICD-10-CM | POA: Insufficient documentation

## 2018-10-26 DIAGNOSIS — I350 Nonrheumatic aortic (valve) stenosis: Secondary | ICD-10-CM | POA: Insufficient documentation

## 2018-10-26 DIAGNOSIS — K219 Gastro-esophageal reflux disease without esophagitis: Secondary | ICD-10-CM | POA: Diagnosis not present

## 2018-10-26 DIAGNOSIS — I2584 Coronary atherosclerosis due to calcified coronary lesion: Secondary | ICD-10-CM | POA: Diagnosis not present

## 2018-10-26 DIAGNOSIS — Z7901 Long term (current) use of anticoagulants: Secondary | ICD-10-CM | POA: Diagnosis not present

## 2018-10-26 DIAGNOSIS — Z8249 Family history of ischemic heart disease and other diseases of the circulatory system: Secondary | ICD-10-CM | POA: Insufficient documentation

## 2018-10-26 DIAGNOSIS — M1990 Unspecified osteoarthritis, unspecified site: Secondary | ICD-10-CM | POA: Diagnosis not present

## 2018-10-26 DIAGNOSIS — I2510 Atherosclerotic heart disease of native coronary artery without angina pectoris: Secondary | ICD-10-CM | POA: Insufficient documentation

## 2018-10-26 DIAGNOSIS — I714 Abdominal aortic aneurysm, without rupture: Secondary | ICD-10-CM | POA: Diagnosis not present

## 2018-10-26 DIAGNOSIS — F17210 Nicotine dependence, cigarettes, uncomplicated: Secondary | ICD-10-CM | POA: Insufficient documentation

## 2018-10-26 DIAGNOSIS — Z7984 Long term (current) use of oral hypoglycemic drugs: Secondary | ICD-10-CM | POA: Diagnosis not present

## 2018-10-26 DIAGNOSIS — E785 Hyperlipidemia, unspecified: Secondary | ICD-10-CM | POA: Diagnosis not present

## 2018-10-26 DIAGNOSIS — J449 Chronic obstructive pulmonary disease, unspecified: Secondary | ICD-10-CM | POA: Insufficient documentation

## 2018-10-26 DIAGNOSIS — I10 Essential (primary) hypertension: Secondary | ICD-10-CM | POA: Insufficient documentation

## 2018-10-26 DIAGNOSIS — I4891 Unspecified atrial fibrillation: Secondary | ICD-10-CM | POA: Diagnosis not present

## 2018-10-26 HISTORY — PX: RIGHT/LEFT HEART CATH AND CORONARY ANGIOGRAPHY: CATH118266

## 2018-10-26 LAB — POCT I-STAT 7, (LYTES, BLD GAS, ICA,H+H)
Acid-base deficit: 5 mmol/L — ABNORMAL HIGH (ref 0.0–2.0)
Bicarbonate: 20.2 mmol/L (ref 20.0–28.0)
Calcium, Ion: 0.96 mmol/L — ABNORMAL LOW (ref 1.15–1.40)
HCT: 31 % — ABNORMAL LOW (ref 39.0–52.0)
Hemoglobin: 10.5 g/dL — ABNORMAL LOW (ref 13.0–17.0)
O2 Saturation: 99 %
Potassium: 3.3 mmol/L — ABNORMAL LOW (ref 3.5–5.1)
Sodium: 146 mmol/L — ABNORMAL HIGH (ref 135–145)
TCO2: 21 mmol/L — ABNORMAL LOW (ref 22–32)
pCO2 arterial: 36 mmHg (ref 32.0–48.0)
pH, Arterial: 7.356 (ref 7.350–7.450)
pO2, Arterial: 153 mmHg — ABNORMAL HIGH (ref 83.0–108.0)

## 2018-10-26 LAB — CBC
HCT: 40.5 % (ref 39.0–52.0)
Hemoglobin: 13.4 g/dL (ref 13.0–17.0)
MCH: 30.7 pg (ref 26.0–34.0)
MCHC: 33.1 g/dL (ref 30.0–36.0)
MCV: 92.7 fL (ref 80.0–100.0)
Platelets: 213 10*3/uL (ref 150–400)
RBC: 4.37 MIL/uL (ref 4.22–5.81)
RDW: 13.5 % (ref 11.5–15.5)
WBC: 8.5 10*3/uL (ref 4.0–10.5)
nRBC: 0 % (ref 0.0–0.2)

## 2018-10-26 LAB — POCT I-STAT EG7
Acid-base deficit: 2 mmol/L (ref 0.0–2.0)
Bicarbonate: 24 mmol/L (ref 20.0–28.0)
Calcium, Ion: 1.23 mmol/L (ref 1.15–1.40)
HCT: 36 % — ABNORMAL LOW (ref 39.0–52.0)
Hemoglobin: 12.2 g/dL — ABNORMAL LOW (ref 13.0–17.0)
O2 Saturation: 70 %
Potassium: 3.8 mmol/L (ref 3.5–5.1)
Sodium: 141 mmol/L (ref 135–145)
TCO2: 25 mmol/L (ref 22–32)
pCO2, Ven: 46.3 mmHg (ref 44.0–60.0)
pH, Ven: 7.324 (ref 7.250–7.430)
pO2, Ven: 40 mmHg (ref 32.0–45.0)

## 2018-10-26 LAB — BASIC METABOLIC PANEL
Anion gap: 13 (ref 5–15)
BUN: 16 mg/dL (ref 8–23)
CO2: 21 mmol/L — ABNORMAL LOW (ref 22–32)
Calcium: 9.3 mg/dL (ref 8.9–10.3)
Chloride: 104 mmol/L (ref 98–111)
Creatinine, Ser: 0.8 mg/dL (ref 0.61–1.24)
GFR calc Af Amer: >60 mL/min (ref >60)
GFR calc non Af Amer: >60 mL/min (ref >60)
Glucose, Bld: 171 mg/dL — ABNORMAL HIGH (ref 70–99)
Potassium: 3.9 mmol/L (ref 3.5–5.1)
Sodium: 138 mmol/L (ref 135–145)

## 2018-10-26 LAB — GLUCOSE, CAPILLARY: Glucose-Capillary: 174 mg/dL — ABNORMAL HIGH (ref 70–99)

## 2018-10-26 SURGERY — RIGHT/LEFT HEART CATH AND CORONARY ANGIOGRAPHY
Anesthesia: LOCAL

## 2018-10-26 MED ORDER — LIDOCAINE HCL (PF) 1 % IJ SOLN
INTRAMUSCULAR | Status: AC
  Filled 2018-10-26: qty 30

## 2018-10-26 MED ORDER — SODIUM CHLORIDE 0.9% FLUSH: 3.0000 mL | INTRAVENOUS | Status: DC | PRN

## 2018-10-26 MED ORDER — FENTANYL CITRATE (PF) 100 MCG/2ML IJ SOLN
INTRAMUSCULAR | Status: DC | PRN
  Administered 2018-10-26 (×2): 25 ug via INTRAVENOUS

## 2018-10-26 MED ORDER — LIDOCAINE HCL (PF) 1 % IJ SOLN
INTRAMUSCULAR | Status: DC | PRN
  Administered 2018-10-26: 15 mL via INTRADERMAL
  Administered 2018-10-26 (×2): 2 mL via INTRADERMAL

## 2018-10-26 MED ORDER — SODIUM CHLORIDE 0.9 % WEIGHT BASED INFUSION: 1.0000 mL/kg/h | INTRAVENOUS | Status: DC

## 2018-10-26 MED ORDER — HEPARIN (PORCINE) IN NACL 1000-0.9 UT/500ML-% IV SOLN
INTRAVENOUS | Status: AC
  Filled 2018-10-26: qty 1000

## 2018-10-26 MED ORDER — ACETAMINOPHEN 325 MG PO TABS: 650.0000 mg | ORAL_TABLET | ORAL | Status: DC | PRN

## 2018-10-26 MED ORDER — VERAPAMIL HCL 2.5 MG/ML IV SOLN
INTRAVENOUS | Status: AC
  Filled 2018-10-26: qty 2

## 2018-10-26 MED ORDER — HYDRALAZINE HCL 20 MG/ML IJ SOLN: 10.0000 mg | INTRAMUSCULAR | Status: DC | PRN

## 2018-10-26 MED ORDER — MIDAZOLAM HCL 2 MG/2ML IJ SOLN
INTRAMUSCULAR | Status: AC
  Filled 2018-10-26: qty 2

## 2018-10-26 MED ORDER — SODIUM CHLORIDE 0.9% FLUSH: 3.0000 mL | Freq: Two times a day (BID) | INTRAVENOUS | Status: DC

## 2018-10-26 MED ORDER — SODIUM CHLORIDE 0.9 % IV SOLN: 250.0000 mL | INTRAVENOUS | Status: DC | PRN

## 2018-10-26 MED ORDER — SODIUM CHLORIDE 0.9 % WEIGHT BASED INFUSION
3.0000 mL/kg/h | INTRAVENOUS | Status: AC
  Administered 2018-10-26: 3 mL/kg/h via INTRAVENOUS

## 2018-10-26 MED ORDER — ASPIRIN 81 MG PO CHEW: 81.0000 mg | CHEWABLE_TABLET | ORAL | Status: DC

## 2018-10-26 MED ORDER — LABETALOL HCL 5 MG/ML IV SOLN: 10.0000 mg | INTRAVENOUS | Status: DC | PRN

## 2018-10-26 MED ORDER — FENTANYL CITRATE (PF) 100 MCG/2ML IJ SOLN
INTRAMUSCULAR | Status: AC
  Filled 2018-10-26: qty 2

## 2018-10-26 MED ORDER — MIDAZOLAM HCL 2 MG/2ML IJ SOLN
INTRAMUSCULAR | Status: DC | PRN
  Administered 2018-10-26 (×2): 1 mg via INTRAVENOUS

## 2018-10-26 MED ORDER — HEPARIN (PORCINE) IN NACL 1000-0.9 UT/500ML-% IV SOLN
INTRAVENOUS | Status: DC | PRN
  Administered 2018-10-26 (×2): 500 mL

## 2018-10-26 MED ORDER — ONDANSETRON HCL 4 MG/2ML IJ SOLN: 4.0000 mg | Freq: Four times a day (QID) | INTRAMUSCULAR | Status: DC | PRN

## 2018-10-26 MED ORDER — SODIUM CHLORIDE 0.9 % IV SOLN: INTRAVENOUS | Status: DC

## 2018-10-26 SURGICAL SUPPLY — 20 items
CATH 5FR JL3.5 JR4 ANG PIG MP (CATHETERS) ×1 IMPLANT
CATH BALLN WEDGE 5F 110CM (CATHETERS) ×1 IMPLANT
CATH INFINITI 5FR JL4 (CATHETERS) ×1 IMPLANT
CLOSURE MYNX CONTROL 5F (Vascular Products) ×1 IMPLANT
COVER DOME SNAP 22 D (MISCELLANEOUS) ×1 IMPLANT
DEVICE RAD COMP TR BAND LRG (VASCULAR PRODUCTS) ×1 IMPLANT
GLIDESHEATH SLEND SS 6F .021 (SHEATH) ×1 IMPLANT
GUIDEWIRE INQWIRE 1.5J.035X260 (WIRE) IMPLANT
INQWIRE 1.5J .035X260CM (WIRE) ×2
KIT HEART LEFT (KITS) ×2 IMPLANT
PACK CARDIAC CATHETERIZATION (CUSTOM PROCEDURE TRAY) ×2 IMPLANT
SHEATH GLIDE SLENDER 4/5FR (SHEATH) ×1 IMPLANT
SHEATH PINNACLE 5F 10CM (SHEATH) ×1 IMPLANT
SHEATH PROBE COVER 6X72 (BAG) ×1 IMPLANT
TRANSDUCER W/STOPCOCK (MISCELLANEOUS) ×2 IMPLANT
TUBING CIL FLEX 10 FLL-RA (TUBING) ×2 IMPLANT
WIRE EMERALD 3MM-J .025X260CM (WIRE) ×1 IMPLANT
WIRE EMERALD 3MM-J .035X150CM (WIRE) ×1 IMPLANT
WIRE EMERALD ST .035X150CM (WIRE) IMPLANT
WIRE HI TORQ VERSACORE-J 145CM (WIRE) ×1 IMPLANT

## 2018-10-26 NOTE — Discharge Instructions (Signed)
Hold Metformin for 48 hours post cath Restart Xarelto on 10/27/18 if no bleeding from arm or groin cath sites   Femoral Site Care This sheet gives you information about how to care for yourself after your procedure. Your health care provider may also give you more specific instructions. If you have problems or questions, contact your health care provider. What can I expect after the procedure? After the procedure, it is common to have:  Bruising that usually fades within 1-2 weeks.  Tenderness at the site. Follow these instructions at home: Wound care  Follow instructions from your health care provider about how to take care of your insertion site. Make sure you: ? Wash your hands with soap and water before you change your bandage (dressing). If soap and water are not available, use hand sanitizer. ? Change your dressing as told by your health care provider. ? Leave stitches (sutures), skin glue, or adhesive strips in place. These skin closures may need to stay in place for 2 weeks or longer. If adhesive strip edges start to loosen and curl up, you may trim the loose edges. Do not remove adhesive strips completely unless your health care provider tells you to do that.  Do not take baths, swim, or use a hot tub until your health care provider approves.  You may shower 24-48 hours after the procedure or as told by your health care provider. ? Gently wash the site with plain soap and water. ? Pat the area dry with a clean towel. ? Do not rub the site. This may cause bleeding.  Do not apply powder or lotion to the site. Keep the site clean and dry.  Check your femoral site every day for signs of infection. Check for: ? Redness, swelling, or pain. ? Fluid or blood. ? Warmth. ? Pus or a bad smell. Activity  For the first 2-3 days after your procedure, or as long as directed: ? Avoid climbing stairs as much as possible. ? Do not squat.  Do not lift anything that is heavier than 10 lb  (4.5 kg), or the limit that you are told, until your health care provider says that it is safe.  Rest as directed. ? Avoid sitting for a long time without moving. Get up to take short walks every 1-2 hours.  Do not drive for 24 hours if you were given a medicine to help you relax (sedative). General instructions  Take over-the-counter and prescription medicines only as told by your health care provider.  Keep all follow-up visits as told by your health care provider. This is important. Contact a health care provider if you have:  A fever or chills.  You have redness, swelling, or pain around your insertion site. Get help right away if:  The catheter insertion area swells very fast.  You pass out.  You suddenly start to sweat or your skin gets clammy.  The catheter insertion area is bleeding, and the bleeding does not stop when you hold steady pressure on the area.  The area near or just beyond the catheter insertion site becomes pale, cool, tingly, or numb. These symptoms may represent a serious problem that is an emergency. Do not wait to see if the symptoms will go away. Get medical help right away. Call your local emergency services (911 in the [place] Do not drive yourself to the hospital. Summary  After the procedure, it is common to have bruising that usually fades within 1-2 weeks.  Check your femoral  site every day for signs of infection.  Do not lift anything that is heavier than 10 lb (4.5 kg), or the limit that you are told, until your health care provider says that it is safe. This information is not intended to replace advice given to you by your health care provider. Make sure you discuss any questions you have with your health care provider. Document Released: 01/19/2014 Document Revised: 05/31/2017 Document Reviewed: 05/31/2017 Elsevier Interactive Patient Education  2019 [employer]  This sheet gives you information about how to care for  yourself after your procedure. Your health care provider may also give you more specific instructions. If you have problems or questions, contact your health care provider. What can I expect after the procedure? After the procedure, it is common to have:  Bruising and tenderness at the catheter insertion area. Follow these instructions at home: Medicines  Take over-the-counter and prescription medicines only as told by your health care provider. Insertion site care  Follow instructions from your health care provider about how to take care of your insertion site. Make sure you: ? Wash your hands with soap and water before you change your bandage (dressing). If soap and water are not available, use hand sanitizer. ? Change your dressing as told by your health care provider. ? Leave stitches (sutures), skin glue, or adhesive strips in place. These skin closures may need to stay in place for 2 weeks or longer. If adhesive strip edges start to loosen and curl up, you may trim the loose edges. Do not remove adhesive strips completely unless your health care provider tells you to do that.  Check your insertion site every day for signs of infection. Check for: ? Redness, swelling, or pain. ? Fluid or blood. ? Pus or a bad smell. ? Warmth.  Do not take baths, swim, or use a hot tub until your health care provider approves.  You may shower 24-48 hours after the procedure, or as directed by your health care provider. ? Remove the dressing and gently wash the site with plain soap and water. ? Pat the area dry with a clean towel. ? Do not rub the site. That could cause bleeding.  Do not apply powder or lotion to the site. Activity   For 24 hours after the procedure, or as directed by your health care provider: ? Do not flex or bend the affected arm. ? Do not push or pull heavy objects with the affected arm. ? Do not drive yourself home from the hospital or clinic. You may drive 24 hours after  the procedure unless your health care provider tells you not to. ? Do not operate machinery or power tools.  Do not lift anything that is heavier than 10 lb (4.5 kg), or the limit that you are told, until your health care provider says that it is safe.  Ask your health care provider when it is okay to: ? Return to work or school. ? Resume usual physical activities or sports. ? Resume sexual activity. General instructions  If the catheter site starts to bleed, raise your arm and put firm pressure on the site. If the bleeding does not stop, get help right away. This is a medical emergency.  If you went home on the same day as your procedure, a responsible adult should be with you for the first 24 hours after you arrive home.  Keep all follow-up visits as told by your health care provider. This  is important. Contact a health care provider if:  You have a fever.  You have redness, swelling, or yellow drainage around your insertion site. Get help right away if:  You have unusual pain at the radial site.  The catheter insertion area swells very fast.  The insertion area is bleeding, and the bleeding does not stop when you hold steady pressure on the area.  Your arm or hand becomes pale, cool, tingly, or numb. These symptoms may represent a serious problem that is an emergency. Do not wait to see if the symptoms will go away. Get medical help right away. Call your local emergency services (911 in the [place] Do not drive yourself to the hospital. Summary  After the procedure, it is common to have bruising and tenderness at the site.  Follow instructions from your health care provider about how to take care of your radial site wound. Check the wound every day for signs of infection.  Do not lift anything that is heavier than 10 lb (4.5 kg), or the limit that you are told, until your health care provider says that it is safe. This information is not intended to replace advice given to you  by your health care provider. Make sure you discuss any questions you have with your health care provider. Document Released: 06/20/2010 Document Revised: 06/23/2017 Document Reviewed: 06/23/2017 Elsevier Interactive Patient Education  2019 [employer].

## 2018-10-26 NOTE — Progress Notes (Signed)
D/c instructions reviewed with wife, Romie Minus, via telephone due to [place] restrictions.  All questions answered and Romie Minus verbalized understanding.

## 2018-10-26 NOTE — Interval H&P Note (Signed)
History and Physical Interval Note:  10/26/2018 9:03 AM  Vincent Velez  has presented today for surgery, with the diagnosis of arotic stenosis.  The various methods of treatment have been discussed with the patient and family. After consideration of risks, benefits and other options for treatment, the patient has consented to  Procedure(s): RIGHT/LEFT HEART CATH AND CORONARY ANGIOGRAPHY (N/A) as a surgical intervention.  The patient's history has been reviewed, patient examined, no change in status, stable for surgery.  I have reviewed the patient's chart and labs.  Questions were answered to the patient's satisfaction.    Cath Lab Visit (complete for each Cath Lab visit)  Clinical Evaluation Leading to the Procedure:   ACS: No.  Non-ACS:    Anginal Classification: CCS II  Anti-ischemic medical therapy: Minimal Therapy (1 class of medications)  Non-Invasive Test Results: No non-invasive testing performed  Prior CABG: No previous CABG         Lauree Chandler

## 2018-10-26 NOTE — H&P (Signed)
Cardiology Admission History and Physical:   Patient ID: Vincent Velez MRN: [id number]; DOB: December 02, 1940   Admission date: 10/26/2018  Primary Care Provider: [name], MD Primary Cardiologist: Candee Furbish, MD  Primary Electrophysiologist:  None   Chief Complaint:  Fatigue/Dyspnea   History of Present Illness:   Vincent Velez is a 78 yo male with history of AAA, arthritis, persistent atrial fibrillation/flutter, CAD, COPD, DM, GERD, HTN, hyperlipidemia, prostate cancer and severe aortic stenosis who is here today for cardiac cath in workup for TAVR. His aortic stenosis has been moderate and has been followed for several years by Dr. Marlou Velez. Echo March 2020 with mild LV systolic dysfunction, DPOE=42-35%. The aortic valve leaflets are thickened and calcified with mean gradient of 41 mmg, peak gradient 61 mmHg, AVA 0.60 cm2, DVI 0.19. This is consistent with severe aortic stenosis. He has undergone EVAR for AAA in 2010. He has had persistent atrial fibrillation and flutter since 2017. He has been on Xarelto. He quit smoking cigars in 2016 but smokes a pipe daily now. He has no known CAD. He has increased fatigue and dyspnea as well as rare chest pain.    Past Medical History:  Diagnosis Date   AAA (abdominal aortic aneurysm) (HCC)    Allergy, unspecified not elsewhere classified    Anxiety    Arthritis    Arthropathy, unspecified, site unspecified    Asthma    when younger   Atrial fibrillation ([place])    CAD (coronary artery disease)    Cataract    removed both   COPD (chronic obstructive pulmonary disease) (HCC)    Diabetes mellitus    Esophageal reflux    Family history of malignant neoplasm of gastrointestinal tract    Hiatal hernia    Hyperlipemia    Hypertension    Malignant neoplasm of prostate ([place])    prostate    Peripheral vascular disease ([place])    Stricture and stenosis of esophagus     Past Surgical History:  Procedure Laterality Date    ABDOMINAL AORTA STENT     CARDIOVERSION N/A 12/04/2015   Procedure: CARDIOVERSION;  Surgeon: Sanda Klein, MD;  Location: [facility];  Service: Cardiovascular;  Laterality: N/A;   COLONOSCOPY     FINGER SURGERY     INSERTION PROSTATE RADIATION SEED     UPPER GASTROINTESTINAL ENDOSCOPY       Medications Prior to Admission: Prior to Admission medications   Medication Sig Start Date End Date Taking? Authorizing Provider  dorzolamide (TRUSOPT) 2 % ophthalmic solution Place 1 drop into both eyes 3 (three) times daily.   Yes [provider]  esomeprazole (NEXIUM) 40 MG capsule Take 40 mg by mouth daily.    Yes [provider]  glimepiride (AMARYL) 2 MG tablet Take 2 mg by mouth daily.   Yes [provider]  ibuprofen (ADVIL,MOTRIN) 200 MG tablet Take 200 mg by mouth daily as needed for headache or moderate pain.   Yes [provider]  metFORMIN (GLUCOPHAGE-XR) 500 MG 24 hr tablet Take 1,000 mg by mouth every evening.  07/01/18  Yes [provider]  metoprolol succinate (TOPROL-XL) 25 MG 24 hr tablet Take 1 tablet (25 mg total) by mouth daily. 07/09/17  Yes Jerline Pain, MD  simvastatin (ZOCOR) 40 MG tablet Take 40 mg by mouth at bedtime.     Yes [provider]  XARELTO 20 MG TABS tablet TAKE 1 TABLET BY MOUTH  DAILY WITH SUPPER Patient taking differently: Take 20  mg by mouth daily with supper.  12/20/17  Yes Jerline Pain, MD     Allergies:   No Known Allergies  Social History:   Social History   Socioeconomic History   Marital status: Married    Spouse name: Not on file   Number of children: 2   Years of education: Not on file   Highest education level: Not on file  Occupational History   Occupation: engineer-retired    Employer: [employer] resource strain: Not on file   Food insecurity:    Worry: Not on file    Inability: Not on file   Transportation needs:    Medical: Not on file     Non-medical: Not on file  Tobacco Use   Smoking status: Light Tobacco Smoker    Types: Pipe, Cigars   Smokeless tobacco: Never Used  Substance and Sexual Activity   Alcohol use: No    Alcohol/week: 0.0 standard drinks   Drug use: No   Sexual activity: Not on file  Lifestyle   Physical activity:    Days per week: Not on file    Minutes per session: Not on file   Stress: Not on file  Relationships   Social connections:    Talks on phone: Not on file    Gets together: Not on file    Attends religious service: Not on file    Active member of club or organization: Not on file    Attends meetings of clubs or organizations: Not on file    Relationship status: Not on file   Intimate partner violence:    Fear of current or ex partner: Not on file    Emotionally abused: Not on file    Physically abused: Not on file    Forced sexual activity: Not on file  Other Topics Concern   Not on file  Social History Narrative   Not on file    Family History:   The patient's family history includes Colon cancer in his father; Deep vein thrombosis in his mother; Diabetes in his mother; Heart attack in his brother; Heart disease in his mother; Hyperlipidemia in his mother; Hypertension in his brother and mother; Prostate cancer in his brother; Varicose Veins in his mother. There is no history of Colon polyps, Esophageal cancer, Rectal cancer, or Stomach cancer.    ROS:  Please see the history of present illness.  All other ROS reviewed and negative.     Physical Exam/Data:   Vitals:   10/26/18 0738  BP: (!) 146/100  Pulse: 93  Resp: 18  Temp: 98 F (36.7 C)  TempSrc: Oral  SpO2: 100%  Weight: 83.9 kg  Height: 5\' 9"  (1.753 m)   No intake or output data in the 24 hours ending 10/26/18 0902 Last 3 Weights 10/26/2018 08/10/2018 07/22/2018  Weight (lbs) 185 lb 187 lb 6.4 oz 187 lb 1.9 oz  Weight (kg) 83.915 kg 85.004 kg 84.877 kg     Body mass index is 27.32 kg/m.  General:   Well nourished, well developed, in no acute distress HEENT: normal Lymph: no adenopathy Neck: no JVD Endocrine:  No thryomegaly Vascular: No carotid bruits; FA pulses 2+ bilaterally without bruits  Cardiac:  normal S1, S2; RRR; loud systolic murmur Lungs:  clear to auscultation bilaterally, no wheezing, rhonchi or rales  Abd: soft, nontender, no hepatomegaly  Ext: no LE edema Musculoskeletal:  No deformities, BUE and BLE strength normal and equal Skin:  warm and dry  Neuro:  CNs 2-12 intact, no focal abnormalities noted Psych:  Normal affect   Relevant CV Studies: Echo 08/04/18: 1. The left ventricle has mildly reduced systolic function, with an ejection fraction of 45-50%. The cavity size was mildly dilated. Left ventricular diastolic parameters were normal Left ventrical global hypokinesis without regional wall motion  abnormalities. 2. The right ventricle has normal systolic function. The cavity was normal. There is no increase in right ventricular wall thickness. 3. Left atrial size was moderately dilated. 4. The mitral valve is normal in structure. Mild thickening of the mitral valve leaflet. 5. The tricuspid valve is normal in structure. 6. The aortic valve is tricuspid Severely thickening of the aortic valve Severe calcifcation of the aortic valve. Aortic valve regurgitation is mild by color flow Doppler. severe stenosis of the aortic valve. 7. Peak velocity from 3 chamber 3.9 m/sec mean gradient 41 mmHg peak 61 mmHg DVI .19 Severe AS especially given decreased EF and morphology. 8. The pulmonic valve was normal in structure. 9. 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  FINDINGS Left Ventricle: The left ventricle has mildly reduced systolic function, with an ejection fraction of 45-50%. The cavity size was mildly dilated. There is no increase in left ventricular wall thickness. Left ventricular diastolic parameters were normal  Left ventrical  global hypokinesis without regional wall motion abnormalities. Right Ventricle: The right ventricle has normal systolic function. The cavity was normal. There is no increase in right ventricular wall thickness. Left Atrium: left atrial size was moderately dilated Right Atrium: right atrial size was normal in size. Interatrial Septum: No atrial level shunt detected by color flow Doppler. Pericardium: There is no evidence of pericardial effusion. Mitral Valve: The mitral valve is normal in structure. Mild thickening of the mitral valve leaflet. Mitral valve regurgitation is trivial by color flow Doppler. Tricuspid Valve: The tricuspid valve is normal in structure. Tricuspid valve regurgitation was not visualized by color flow Doppler. Aortic Valve: The aortic valve is tricuspid Severely thickening of the aortic valve Severe calcifcation of the aortic valve. Aortic valve regurgitation is mild by color flow Doppler. There is severe stenosis of the aortic valve, with a calculated valve  area of 0.58 cm. Peak velocity from 3 chamber 3.9 m/sec mean gradient 41 mmHg peak 61 mmHg DVI .19 Severe AS especially given decreased EF and morphology. Pulmonic Valve: The pulmonic valve was normal in structure. Pulmonic valve regurgitation is not visualized by color flow Doppler. 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 Venous: The inferior vena cava is normal in size with greater than 50% respiratory variability.  LEFT VENTRICLE PLAX 2D ([place]) LV EF: 36.0 % Diastology LVIDd: 5.70 cm LV e' lateral: 9.90 cm/s LVIDs: 4.70 cm LV E/e' lateral: 8.5 LV PW: 0.70 cm LV e' medial: 7.29 cm/s LV IVS: 1.00 cm LV E/e' medial: 11.6 LVOT diam: 2.00 cm LV SV: 58 ml LVOT Area: 3.14 cm  RIGHT VENTRICLE RV Basal diam: 4.10 cm RV S prime: 11.70 cm/s  LEFT ATRIUM Index RIGHT ATRIUM  Index LA diam: 4.80 cm 2.39 cm/m RA Area: 22.90 cm LA Vol (A2C): 76.9 ml 38.29 ml/m RA Volume: 82.00 ml 40.83 ml/m LA Vol (A4C): 84.3 ml 41.97 ml/m LA Biplane Vol: 81.4 ml 40.53 ml/m AORTIC VALVE AV Area (Vmax): 0.67 cm AV Area (Vmean): 0.60 cm AV Area (VTI): 0.58 cm AV Vmax: 335.60 cm/s AV Vmean: 259.200 cm/s AV VTI: 0.755 m AV  Peak Grad: 45.1 mmHg AV Mean Grad: 29.8 mmHg LVOT Vmax: 71.60 cm/s LVOT Vmean: 49.200 cm/s LVOT VTI: 0.140 m LVOT/AV VTI ratio: 0.19 AR PHT: 408 msec  AORTA Ao Root diam: 3.50 cm Ao Asc diam: 4.30 cm  MITRAL VALVE TRICUSPID VALVE MV Area (PHT): TR Peak grad: 21.5 mmHg MV PHT: TR Vmax: 233.00 cm/s MV Decel Time: 123 msec MV E velocity: 84.40 cm/s MV A velocity: 45.80 cm/s MV E/A ratio: 1.84  Laboratory Data:  ChemistryNo results for input(s): NA, K, CL, CO2, GLUCOSE, BUN, CREATININE, CALCIUM, GFRNONAA, GFRAA, ANIONGAP in the last 168 hours.  No results for input(s): PROT, ALBUMIN, AST, ALT, ALKPHOS, BILITOT in the last 168 hours. Hematology Recent Labs  Lab 10/26/18 0759  WBC 8.5  RBC 4.37  HGB 13.4  HCT 40.5  MCV 92.7  MCH 30.7  MCHC 33.1  RDW 13.5  PLT 213   Cardiac EnzymesNo results for input(s): TROPONINI in the last 168 hours. No results for input(s): TROPIPOC in the last 168 hours.  BNPNo results for input(s): BNP, PROBNP in the last 168 hours.  DDimer No results for input(s): DDIMER in the last 168 hours.  Radiology/Studies:  No results found.  Assessment and Plan:   1. Severe aortic stenosis: Right and left heart cath today. Workup for TAVR underway.    For questions or updates, please contact [place] Please consult [email or web address] for contact info under        Signed, Lauree Chandler, MD  10/26/2018 9:02 AM

## 2018-10-27 MED FILL — Verapamil HCl IV Soln 2.5 MG/ML: INTRAVENOUS | Qty: 2 | Status: AC

## 2018-10-31 ENCOUNTER — Ambulatory Visit: Payer: Medicare Other

## 2018-10-31 ENCOUNTER — Ambulatory Visit: Payer: Medicare Other | Attending: Physician Assistant | Admitting: Physical Therapy

## 2018-10-31 ENCOUNTER — Other Ambulatory Visit: Payer: Self-pay

## 2018-10-31 ENCOUNTER — Encounter: Payer: Self-pay | Admitting: Physical Therapy

## 2018-10-31 ENCOUNTER — Ambulatory Visit (HOSPITAL_COMMUNITY)
Admission: RE | Admit: 2018-10-31 | Discharge: 2018-10-31 | Disposition: A | Discharge status: Home / Self Care | Payer: Medicare Other | Source: Ambulatory Visit | Attending: Physician Assistant | Admitting: Physician Assistant

## 2018-10-31 DIAGNOSIS — I77819 Aortic ectasia, unspecified site: Secondary | ICD-10-CM | POA: Diagnosis not present

## 2018-10-31 DIAGNOSIS — I700 Atherosclerosis of aorta: Secondary | ICD-10-CM | POA: Diagnosis not present

## 2018-10-31 DIAGNOSIS — R2689 Other abnormalities of gait and mobility: Secondary | ICD-10-CM | POA: Diagnosis not present

## 2018-10-31 DIAGNOSIS — I350 Nonrheumatic aortic (valve) stenosis: Secondary | ICD-10-CM | POA: Insufficient documentation

## 2018-10-31 IMAGING — CT CT HEAR MORPH WITH CTA COR WITH SCORE WITH CA WITH CONTRAST AND
2 of 9 series · 9 of 20 positions shown, 11 images · IV contrast (APPLIED)
Comparison: None.
COMPARISON: None.

Addendum:
EXAM:
OVER-READ INTERPRETATION  CT CHEST

The following report is an over-read performed by radiologist Dr.
RTOYOTA [REDACTED] on [DATE]. This
over-read does not include interpretation of cardiac or coronary
anatomy or pathology. The coronary calcium score/coronary CTA
interpretation by the cardiologist is attached.
CLINICAL DATA: 77-year-old male with severe aortic stenosis being
evaluated for a TAVR procedure.
Cardiac TAVR CT
TECHNIQUE: The patient was scanned on a Phillips Force scanner. A 120 kV
retrospective scan was triggered in the descending thoracic aorta at
111 HU's. Gantry rotation speed was 250 msecs and collimation was .6
mm. No beta blockade or nitro were given. The 3D data set was
reconstructed in 5% intervals of the R-R cycle. Systolic and
diastolic phases were analyzed on a dedicated work station using
MPR, MIP and VRT modes. The patient received 80 cc of contrast.

[Series 8: 0-90% · axial · 0.38mm/px · z∈[+1254,+1374]mm · 4 of 3330 slices shown]
[im 666/3330  vessel]
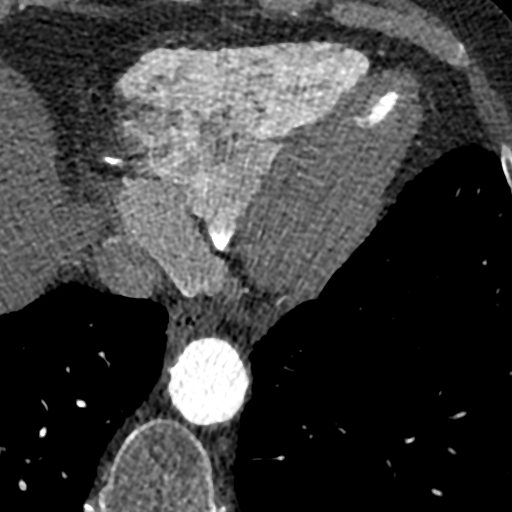
[im 1332/3330  vessel]
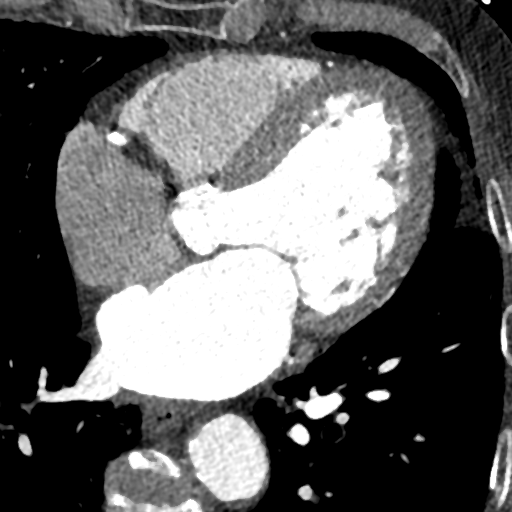
[im 1998/3330  vessel]
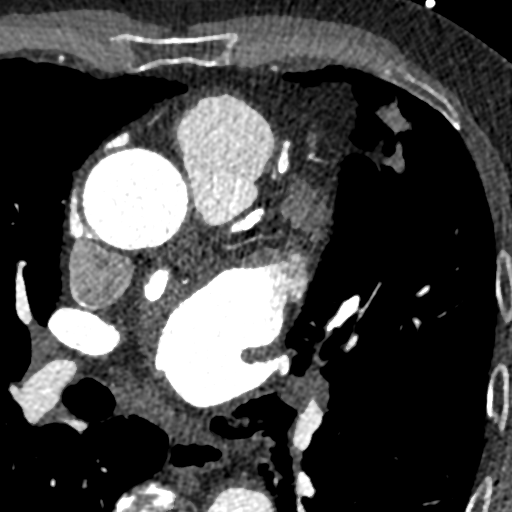
[im 2664/3330  vessel]
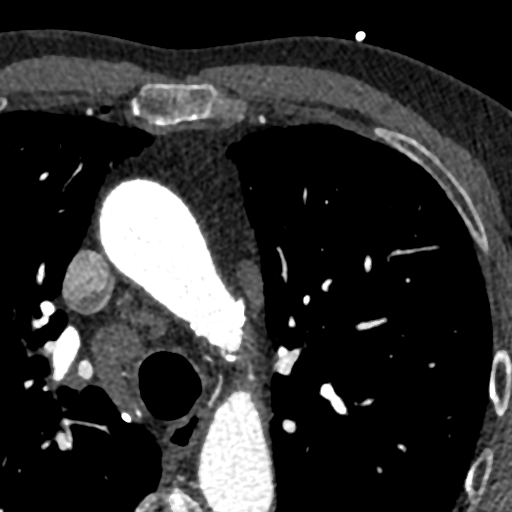

[Series 9: 5-95% · axial · 0.38mm/px · z∈[+1248,+1381]mm · 5 of 3330 slices shown, 7 images]
[im 555/3330  vessel]
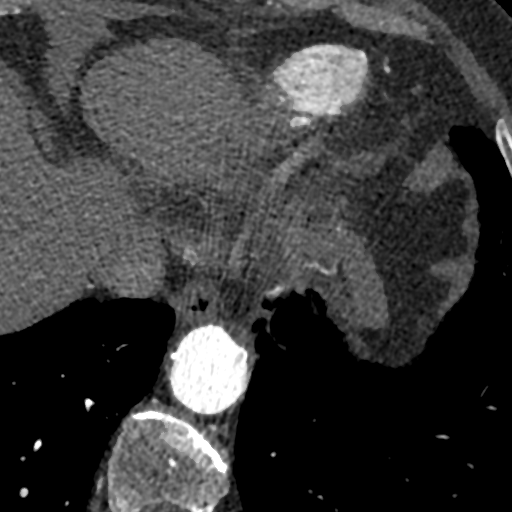
[im 555/3330  lung]
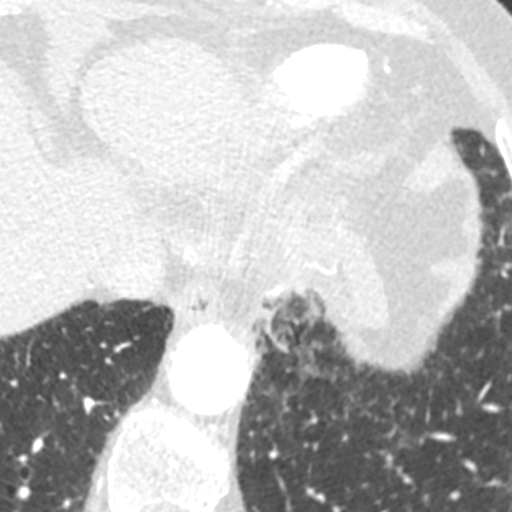
[im 1110/3330  vessel]
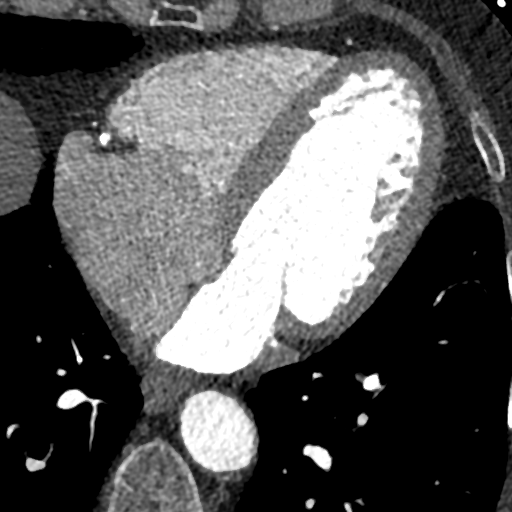
[im 1665/3330  vessel]
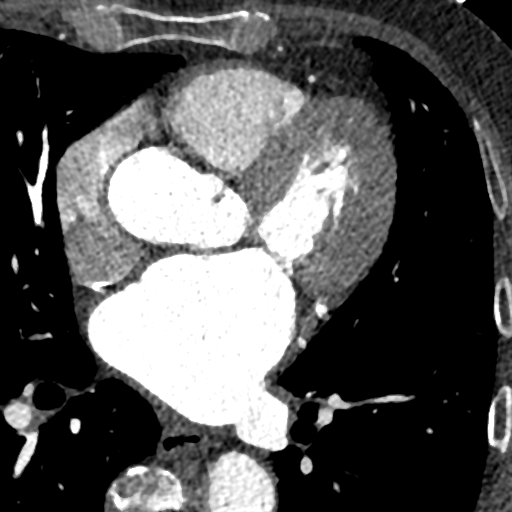
[im 2220/3330  vessel]
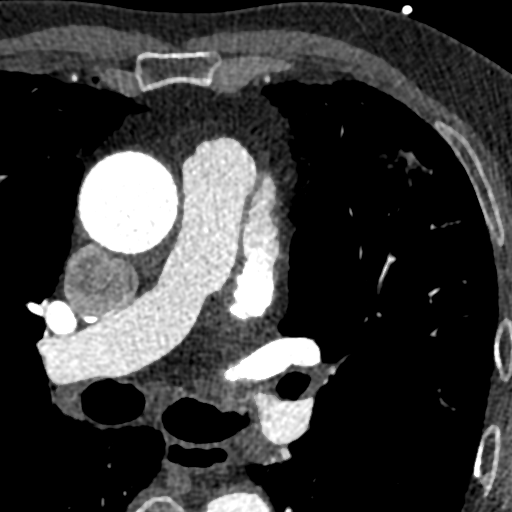
[im 2775/3330  vessel]
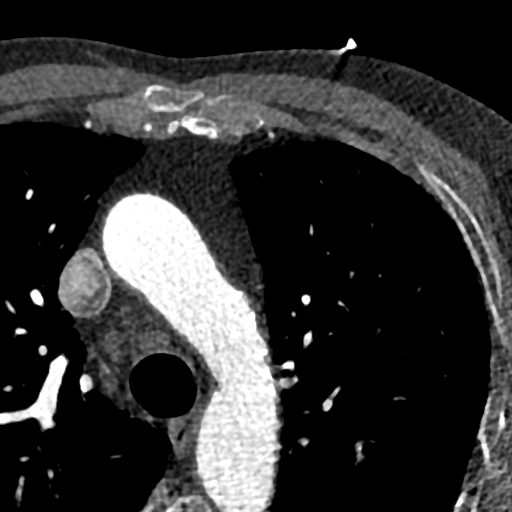
[im 2775/3330  lung]
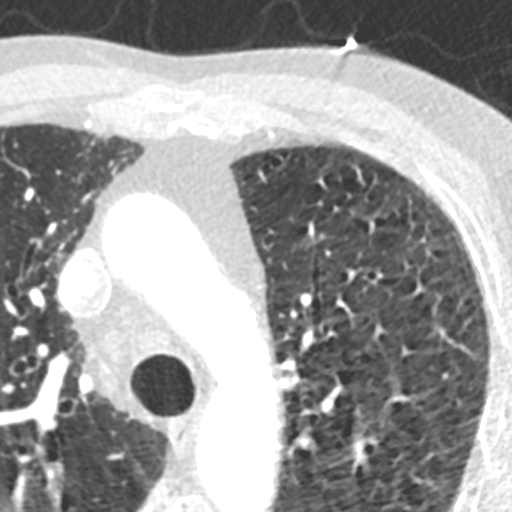

[9 of 20 positions shown; findings below may reference images not displayed]

FINDINGS: Extracardiac findings will be described separately under dictation
for contemporaneously obtained CTA chest, abdomen and pelvis.
IMPRESSION: Please see separate dictation for contemporaneously obtained CTA
chest, abdomen and pelvis dated [DATE] for full description of
relevant extracardiac findings.
FINDINGS: Aortic Valve: Trileaflet aortic valve with severely thickened and
calcified leaflets with severely restricted leaflets opening and no
calcifications extending into the LVOT.

Aorta: Normal size with mild calcifications in the ascending aorta
and moderate diffuse atherosclerotic plaque and calcifications in
the aortic arch and descending aorta.

Sinotubular Junction: 32 x 30 mm

Ascending Thoracic Aorta: 40 x 39 mm

Aortic Arch: 26 x 25 mm

Descending Thoracic Aorta: 28 x 28 mm

Sinus of Valsalva Measurements:

Non-coronary: 35 mm

Right -coronary: 31 mm

Left -coronary: 34 mm

Coronary Artery Height above Annulus:

Left Main: 13 mm

Right Coronary: 14 mm

Virtual Basal Annulus Measurements:

Maximum/Minimum Diameter: 29.5 x 23.6 mm

Area derived Diameter: 25.7 mm

Perimeter: 82.5 mm

Area: 518 mm2

Optimum Fluoroscopic Angle for Delivery: LAO 10 RTOYOTA 4
IMPRESSION: 1. Trileaflet aortic valve with severely thickened and calcified
leaflets with severely restricted leaflets opening and no
calcifications extending into the LVOT. Annular measurements are
suitable for delivery of a 26 mm Edwards-SAPIEN 3 valve.

2. Sufficient coronary to annulus distance.

3. Optimum Fluoroscopic Angle for Delivery:  LAO 10 RTOYOTA 4.

4. No thrombus in the left atrial appendage.

*** End of Addendum ***
EXAM:
OVER-READ INTERPRETATION  CT CHEST

The following report is an over-read performed by radiologist Dr.
RTOYOTA [REDACTED] on [DATE]. This
over-read does not include interpretation of cardiac or coronary
anatomy or pathology. The coronary calcium score/coronary CTA
interpretation by the cardiologist is attached.
FINDINGS: Extracardiac findings will be described separately under dictation
for contemporaneously obtained CTA chest, abdomen and pelvis.
IMPRESSION: Please see separate dictation for contemporaneously obtained CTA
chest, abdomen and pelvis dated [DATE] for full description of
relevant extracardiac findings.

## 2018-10-31 MED ORDER — IOHEXOL 350 MG/ML SOLN
100.0000 mL | Freq: Once | INTRAVENOUS | Status: AC | PRN
  Administered 2018-10-31: 13:00:00 100 mL via INTRAVENOUS

## 2018-10-31 NOTE — Therapy (Signed)
[facility] [place], [place], [address] Phone: [phone]   Fax:  [phone]  Physical Therapy Evaluation  Patient Details  Name: Vincent Velez MRN: [id number] Date of Birth: [birth year] Referring Provider (PT): Sherren Mocha, MD   Encounter Date: 10/31/2018  PT End of Session - 10/31/18 1322    Visit Number  1    Number of Visits  1    Date for PT Re-Evaluation  10/31/18    PT Start Time  1325    PT Stop Time  1358    PT Time Calculation (min)  33 min    Activity Tolerance  Patient tolerated treatment well    Behavior During Therapy  [facility] for tasks assessed/performed       Past Medical History:  Diagnosis Date  . AAA (abdominal aortic aneurysm) ([place])   . Allergy, unspecified not elsewhere classified   . Anxiety   . Arthritis   . Arthropathy, unspecified, site unspecified   . Asthma    when younger  . Atrial fibrillation ([place])   . CAD (coronary artery disease)   . Cataract    removed both  . COPD (chronic obstructive pulmonary disease) ([place])   . Diabetes mellitus   . Esophageal reflux   . Family history of malignant neoplasm of gastrointestinal tract   . Hiatal hernia   . Hyperlipemia   . Hypertension   . Malignant neoplasm of prostate ([place])    prostate   . Peripheral vascular disease ([place])   . Stricture and stenosis of esophagus     Past Surgical History:  Procedure Laterality Date  . ABDOMINAL AORTA STENT    . CARDIOVERSION N/A 12/04/2015   Procedure: CARDIOVERSION;  Surgeon: Sanda Klein, MD;  Location: [facility];  Service: Cardiovascular;  Laterality: N/A;  . COLONOSCOPY    . FINGER SURGERY    . INSERTION PROSTATE RADIATION SEED    . RIGHT/LEFT HEART CATH AND CORONARY ANGIOGRAPHY N/A 10/26/2018   Procedure: RIGHT/LEFT HEART CATH AND CORONARY ANGIOGRAPHY;  Surgeon: Burnell Blanks, MD;  Location: [facility];  Service: Cardiovascular;  Laterality: N/A;  . UPPER  GASTROINTESTINAL ENDOSCOPY      There were no vitals filed for this visit.   Subjective Assessment - 10/31/18 1322    Subjective  pt isa 78 y.o M with CC of being mostly dizzy with intermittent SOB and chest tightness that has been going on for a couple years and has accelerated in the last 6 months. He reports feeling generally weak overall which as progress as his symptoms have over the last 6 months.    Patient Stated Goals  to get heart better    Currently in Pain?  No/denies    Pain Score  0-No pain         [facility] PT Assessment - 10/31/18 1323      Assessment   Medical Diagnosis  Severe Aortic Stenosis    Referring Provider (PT)  Sherren Mocha, MD    Onset Date/Surgical Date  --   a few years ago   Hand Dominance  Left      Precautions   Precautions  None      Restrictions   Weight Bearing Restrictions  No      Balance Screen   Has the patient fallen in the past 6 months  No    Has the patient had a decrease in activity level because of a fear of falling?   No  Is the patient reluctant to leave their home because of a fear of falling?   No      Home Film/video editor residence    Living Arrangements  Spouse/significant other    Available Help at Discharge  Family    Type of [place] to enter    Entrance Stairs-Number of Steps  5    Entrance Stairs-Rails  Can reach both    [place]  Two level    Alternate Level Stairs-Number of Steps  12    Alternate Level Stairs-Rails  Left;Right    Home Equipment  None      Prior Function   Level of Independence  Independent with basic ADLs    Vocation  Retired      Charity fundraiser Status  Within Functional Limits for tasks assessed      ROM / Strength   AROM / PROM / Strength  AROM;Strength      AROM   Overall AROM   Within functional limits for tasks performed    Overall AROM Comments  R shoulder stiffness with internal rotation compared bil       Strength   Overall Strength  Within functional limits for tasks performed    Strength Assessment Site  Hand    Right Hand Grip (lbs)  92    Left Hand Grip (lbs)  109      Ambulation/Gait   Ambulation/Gait  Yes    Gait Pattern  Trendelenburg;Antalgic    Gait Comments  pt was able to amb for the entire 6 min walk test without require rest break       [facility] Pre-Surgical Assessment - 10/31/18 0001    5 Meter Walk Test- trial 1  3 sec    5 Meter Walk Test- trial 2  2 sec.     5 Meter Walk Test- trial 3  2 sec.    5 meter walk test average  2.33 sec    4 Stage Balance Test tolerated for:   5 sec.    4 Stage Balance Test Position  3    Sit To Stand Test- trial 1  10 sec.    ADL/IADL Independent with:  Bathing;Meal prep;Dressing;Finances;Yard work    ADL/IADL Therapist, sports Index  Vulnerable    6 Minute Walk- Baseline  yes    BP (mmHg)  114/81    HR (bpm)  98    02 Sat (%RA)  97 %    Modified Borg Scale for Dyspnea  2- Mild shortness of breath    Perceived Rate of Exertion (Borg)  13- Somewhat hard    6 Minute Walk Post Test  yes    BP (mmHg)  (!) 146/97    HR (bpm)  116    02 Sat (%RA)  95 %    Modified Borg Scale for Dyspnea  2- Mild shortness of breath    Perceived Rate of Exertion (Borg)  13- Somewhat hard    Aerobic Endurance Distance Walked  1580    Endurance additional comments  pt is 8.62% limited compared to age related norm              Objective measurements completed on examination: See above findings.                           Plan - 10/31/18 1322  Clinical Impression Statement  see assessment in note    PT Frequency  One time visit    PT Next Visit Plan  Pre- TAVR evaluation    Consulted and Agree with Plan of Care  Patient        Clinical Impression Statement: Pt is a 78 yo M presenting to OP PT for evaluation prior to possible TAVR surgery due to severe aortic stenosis. Pt reports onset of general weakness/ occaisonal SOB   approximately a few years ago with progressive worsening withing the last 6 months ago. Symptoms are limiting endurance. Pt presents with good ROM and strength, fair balance and is assess as lo2 at the high fall risk 4 stage balance test, good walking speed and good aerobic endurance per 6 minute walk test. Pt ambulated 1580 feet withou out required rest break. At the end of the test patient's HR was 116 bpm and O2 was 95 on room air. Pt reported 2/10 shortness of breath on modified scale for dyspnea. Fatigue increased significantly with 6 minute walk test. Based on the Short Physical Performance Battery, patient has a frailty rating of 11/12 with </= 5/12 considered frail.    Patient demonstrated the following deficits and impairments:     Visit Diagnosis: Other abnormalities of gait and mobility     Problem List Patient Active Problem List   Diagnosis Date Noted  . Severe aortic stenosis   . Special screening for malignant neoplasms, colon 05/04/2016  . RUQ abdominal pain 05/04/2016  . Chronic anticoagulation 05/04/2016  . Persistent atrial fibrillation   . Aftercare following surgery of the circulatory system, [place] 05/19/2013  . Abdominal aneurysm without mention of rupture 05/10/2012  . ADENOCARCINOMA, 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  . ESOPHAGEAL STRICTURE 04/30/2008  . GERD 04/30/2008  . HIATAL HERNIA 04/30/2008  . ARTHRITIS 04/30/2008  . ALLERGY 04/30/2008    [place] 10/31/2018, 2:07 PM  [facility] [address] [place], [place], [address] Phone: [phone]   Fax:  [phone]  Name: HADDON FYFE MRN: [id number] Date of Birth: 04-14-1941

## 2018-11-02 ENCOUNTER — Other Ambulatory Visit: Payer: Self-pay

## 2018-11-03 ENCOUNTER — Other Ambulatory Visit: Payer: Self-pay | Admitting: *Deleted

## 2018-11-03 ENCOUNTER — Institutional Professional Consult (permissible substitution) (INDEPENDENT_AMBULATORY_CARE_PROVIDER_SITE_OTHER): Payer: Medicare Other | Admitting: Surgery

## 2018-11-03 ENCOUNTER — Encounter: Payer: Self-pay | Admitting: Surgery

## 2018-11-03 VITALS — BP 108/80 | HR 86 | Temp 97.7°F | Resp 16 | Ht 69.0 in | Wt 188.0 lb

## 2018-11-03 DIAGNOSIS — R918 Other nonspecific abnormal finding of lung field: Secondary | ICD-10-CM | POA: Diagnosis not present

## 2018-11-03 DIAGNOSIS — Z01818 Encounter for other preprocedural examination: Secondary | ICD-10-CM

## 2018-11-03 DIAGNOSIS — I4819 Other persistent atrial fibrillation: Secondary | ICD-10-CM | POA: Diagnosis not present

## 2018-11-03 DIAGNOSIS — R911 Solitary pulmonary nodule: Secondary | ICD-10-CM

## 2018-11-03 DIAGNOSIS — I350 Nonrheumatic aortic (valve) stenosis: Secondary | ICD-10-CM

## 2018-11-03 NOTE — Progress Notes (Signed)
Patient ID: Vincent Velez, male   DOB: 1940-07-02, 78 y.o.   MRN: [id number]  [facility]GERY CONSULTATION REPORT  Referring Provider is Jerline Pain, MD Primary Cardiologist is Candee Furbish, MD PCP is [name], MD  Chief Complaint  Patient presents with   Aortic Stenosis    TAVR CONSULT...review completed required studies/tests    HPI:  The patient is a 78 year old gentleman with a history of hypertension, hyperlipidemia, diabetes, ongoing smoking with COPD, persistent atrial fibrillation on anticoagulation, abdominal aortic aneurysm s/p EVAR and moderate aortic stenosis diagnosed by echocardiogram in May 2017 when the mean gradient was 25 mmHg.  The patient now has progressive exertional fatigue and shortness of breath over the past several months and a follow-up echocardiogram on 08/04/2018 showed progression to severe aortic stenosis with a mean gradient of 41 mmHg, a peak gradient of 61 mmHg, and a dimensionless index of 0.19.  Left ventricular ejection fraction is mildly reduced at 45 to 50% with mild left ventricular dilatation.  Cardiac catheterization on 10/26/2018 showed mild nonobstructive coronary disease.  The mean gradient and cath was 16.7 mmHg with a peak to peak gradient of 21 mmHg.  Past Medical History:  Diagnosis Date   AAA (abdominal aortic aneurysm) (HCC)    Allergy, unspecified not elsewhere classified    Anxiety    Arthritis    Arthropathy, unspecified, site unspecified    Asthma    when younger   Atrial fibrillation (HCC)    CAD (coronary artery disease)    Cataract    removed both   COPD (chronic obstructive pulmonary disease) (HCC)    Diabetes mellitus    Esophageal reflux    Family history of malignant neoplasm of gastrointestinal tract    Hiatal hernia    Hyperlipemia    Hypertension    Malignant neoplasm of prostate ([place])    prostate    Peripheral  vascular disease ([place])    Stricture and stenosis of esophagus     Past Surgical History:  Procedure Laterality Date   ABDOMINAL AORTA STENT     CARDIOVERSION N/A 12/04/2015   Procedure: CARDIOVERSION;  Surgeon: Sanda Klein, MD;  Location: [facility];  Service: Cardiovascular;  Laterality: N/A;   COLONOSCOPY     FINGER SURGERY     INSERTION PROSTATE RADIATION SEED     RIGHT/LEFT HEART CATH AND CORONARY ANGIOGRAPHY N/A 10/26/2018   Procedure: RIGHT/LEFT HEART CATH AND CORONARY ANGIOGRAPHY;  Surgeon: Burnell Blanks, MD;  Location: [facility];  Service: Cardiovascular;  Laterality: N/A;   UPPER GASTROINTESTINAL ENDOSCOPY      Family History  Problem Relation Age of Onset   Colon cancer Father        questionable   Diabetes Mother    Heart disease Mother        Heart Disease before age 31   Deep vein thrombosis Mother    Hyperlipidemia Mother    Hypertension Mother    Varicose Veins Mother    Hypertension Brother    Heart attack Brother    Prostate cancer Brother    Colon polyps Neg Hx    Esophageal cancer Neg Hx    Rectal cancer Neg Hx    Stomach cancer Neg Hx     Social History   Socioeconomic History   Marital status: Married    Spouse name: Not on file   Number of children: 2   Years of education:  Not on file   Highest education level: Not on file  Occupational History   Occupation: engineer-retired    Employer: [employer] resource strain: Not on file   Food insecurity:    Worry: Not on file    Inability: Not on file   Transportation needs:    Medical: Not on file    Non-medical: Not on file  Tobacco Use   Smoking status: Light Tobacco Smoker    Types: Pipe, Cigars   Smokeless tobacco: Never Used  Substance and Sexual Activity   Alcohol use: No    Alcohol/week: 0.0 standard drinks   Drug use: No   Sexual activity: Not on file  Lifestyle   Physical activity:    Days per  week: Not on file    Minutes per session: Not on file   Stress: Not on file  Relationships   Social connections:    Talks on phone: Not on file    Gets together: Not on file    Attends religious service: Not on file    Active member of club or organization: Not on file    Attends meetings of clubs or organizations: Not on file    Relationship status: Not on file   Intimate partner violence:    Fear of current or ex partner: Not on file    Emotionally abused: Not on file    Physically abused: Not on file    Forced sexual activity: Not on file  Other Topics Concern   Not on file  Social History Narrative   Not on file    Current Outpatient Medications  Medication Sig Dispense Refill   dorzolamide (TRUSOPT) 2 % ophthalmic solution Place 1 drop into both eyes 3 (three) times daily.     esomeprazole (NEXIUM) 40 MG capsule Take 40 mg by mouth daily.      glimepiride (AMARYL) 2 MG tablet Take 2 mg by mouth daily.     ibuprofen (ADVIL,MOTRIN) 200 MG tablet Take 200 mg by mouth daily as needed for headache or moderate pain.     metFORMIN (GLUCOPHAGE-XR) 500 MG 24 hr tablet Take 1,000 mg by mouth every evening.      metoprolol succinate (TOPROL-XL) 25 MG 24 hr tablet Take 1 tablet (25 mg total) by mouth daily.     simvastatin (ZOCOR) 40 MG tablet Take 40 mg by mouth at bedtime.       XARELTO 20 MG TABS tablet TAKE 1 TABLET BY MOUTH  DAILY WITH SUPPER (Patient taking differently: Take 20 mg by mouth daily with supper. ) 90 tablet 1   Current Facility-Administered Medications  Medication Dose Route Frequency Provider Last Rate Last Dose   0.9 %  sodium chloride infusion  500 mL Intravenous Continuous Irene Shipper, MD        No Known Allergies    Review of Systems:   General:  Normal appetite, + decreased energy, no weight gain, no weight loss, no fever  Cardiac:  no chest pain with exertion, no chest pain at rest, +SOB with  exertion, no resting SOB, no PND, no  orthopnea, no palpitations, + arrhythmia, + atrial fibrillation, + LE edema, + dizzy most of the time, no syncope  Respiratory:  + shortness of breath, no home oxygen, no productive cough, no dry cough, no bronchitis, no wheezing, no hemoptysis, no asthma, no pain with inspiration or cough, no sleep apnea, no CPAP at night  GI:   no difficulty  swallowing, no reflux, no frequent heartburn, no hiatal hernia, no abdominal pain, no constipation, no diarrhea, no hematochezia, no hematemesis, no melena  GU:   no dysuria,  no frequency, no urinary tract infection, no hematuria, no enlarged prostate, no kidney stones, no kidney disease  Vascular:  no pain suggestive of claudication, no pain in feet, no leg cramps, no varicose veins, no DVT, no non-healing foot ulcer  Neuro:   no stroke, no TIA's, no seizures, no headaches, no temporary blindness one eye,  no slurred speech, no peripheral neuropathy, no chronic pain, no instability of gait, no memory/cognitive dysfunction  Musculoskeletal: no arthritis, no joint swelling, no myalgias, no difficulty walking, norma mobility   Skin:   no rash, no itching, no skin infections, no pressure sores or ulcerations  Psych:   no anxiety, no depression, no nervousness, no unusual recent stress  Eyes:   no blurry vision, no floaters, no recent vision changes, + wears glasses or contacts  ENT:   no hearing loss, no loose or painful teeth, no dentures  Hematologic:  no easy bruising, no abnormal bleeding, no clotting disorder, no frequent epistaxis  Endocrine:  + diabetes, does check CBG's at home           Physical Exam:   BP 108/80 (BP Location: Left Arm, Patient Position: Sitting, Cuff Size: Normal)    Pulse 86    Temp 97.7 F (36.5 C) (Skin)    Resp 16    Ht 5\' 9"  (1.753 m)    Wt 188 lb (85.3 kg)    SpO2 96% Comment: ON RA   BMI 27.76 kg/m   General:  Elderly but  well-appearing  HEENT:  Unremarkable, NCAT, PERLA, EOMI  Neck:   no JVD, no bruits, no adenopathy  or thyromegaly  Chest:   clear to auscultation, symmetrical breath sounds, no wheezes, no rhonchi   CV:   RRR, grade lll/VI crescendo/decrescendo murmur heard best at RSB,  no diastolic murmur  Abdomen:  soft, non-tender, no masses   Extremities:  warm, well-perfused, pulses palpable, no LE edema  Rectal/GU  Deferred  Neuro:   Grossly non-focal and symmetrical throughout  Skin:   Clean and dry, no rashes, no breakdown   Diagnostic Tests:  Patient Name:   Vincent Velez Date of Exam: 08/04/2018 Medical Rec #:  [id number]        Height:       69.0 in Accession #:    [id number]       Weight:       187.1 lb Date of Birth:  08-05-1940        BSA:          2.01 m Patient Age:    49 years         BP:           140/80 mmHg Patient Gender: M                HR:           90 bpm. Exam Location:  [address]    Procedure: 2D Echo, Cardiac Doppler and Color Doppler  Indications:    I35.0 Aortic Stenosis   History:        Patient has prior history of Echocardiogram examinations, most                 recent 10/08/2015. Risk Factors: Hypertension, Dyslipidemia and  Current Smoker. AAA. Asthma. Atrial Fibrillation. Coronary                 artery disease. COPD. Murmur. Peripheral vascular disease.   Sonographer:    Wilford Sports Rodgers-Jones RDCS Referring Phys: [id number] MARK C SKAINS  IMPRESSIONS    1. The left ventricle has mildly reduced systolic function, with an ejection fraction of 45-50%. The cavity size was mildly dilated. Left ventricular diastolic parameters were normal Left ventrical global hypokinesis without regional wall motion  abnormalities.  2. The right ventricle has normal systolic function. The cavity was normal. There is no increase in right ventricular wall thickness.  3. Left atrial size was moderately dilated.  4. The mitral valve is normal in structure. Mild thickening of the mitral valve leaflet.  5. The tricuspid valve is normal in structure.  6. The  aortic valve is tricuspid Severely thickening of the aortic valve Severe calcifcation of the aortic valve. Aortic valve regurgitation is mild by color flow Doppler. severe stenosis of the aortic valve.  7. Peak velocity from 3 chamber 3.9 m/sec mean gradient 41 mmHg peak 61 mmHg DVI .19 Severe AS especially given decreased EF and morphology.  8. The pulmonic valve was normal in structure.  9. 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  FINDINGS  Left Ventricle: The left ventricle has mildly reduced systolic function, with an ejection fraction of 45-50%. The cavity size was mildly dilated. There is no increase in left ventricular wall thickness. Left ventricular diastolic parameters were normal  Left ventrical global hypokinesis without regional wall motion abnormalities. Right Ventricle: The right ventricle has normal systolic function. The cavity was normal. There is no increase in right ventricular wall thickness. Left Atrium: left atrial size was moderately dilated Right Atrium: right atrial size was normal in size. Interatrial Septum: No atrial level shunt detected by color flow Doppler. Pericardium: There is no evidence of pericardial effusion. Mitral Valve: The mitral valve is normal in structure. Mild thickening of the mitral valve leaflet. Mitral valve regurgitation is trivial by color flow Doppler. Tricuspid Valve: The tricuspid valve is normal in structure. Tricuspid valve regurgitation was not visualized by color flow Doppler. Aortic Valve: The aortic valve is tricuspid Severely thickening of the aortic valve Severe calcifcation of the aortic valve. Aortic valve regurgitation is mild by color flow Doppler. There is severe stenosis of the aortic valve, with a calculated valve  area of 0.58 cm. Peak velocity from 3 chamber 3.9 m/sec mean gradient 41 mmHg peak 61 mmHg DVI .19 Severe AS especially given decreased EF and morphology. Pulmonic Valve: The pulmonic valve  was normal in structure. Pulmonic valve regurgitation is not visualized by color flow Doppler. 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 Venous: The inferior vena cava is normal in size with greater than 50% respiratory variability.   LEFT VENTRICLE PLAX 2D (Teich) LV EF:          36.0 %   Diastology LVIDd:          5.70 cm  LV e' lateral:   9.90 cm/s LVIDs:          4.70 cm  LV E/e' lateral: 8.5 LV PW:          0.70 cm  LV e' medial:    7.29 cm/s LV IVS:         1.00 cm  LV E/e' medial:  11.6 LVOT diam:  2.00 cm LV SV:          58 ml LVOT Area:      3.14 cm  RIGHT VENTRICLE RV Basal diam:  4.10 cm RV S prime:     11.70 cm/s  LEFT ATRIUM             Index       RIGHT ATRIUM           Index LA diam:        4.80 cm 2.39 cm/m  RA Area:     22.90 cm LA Vol (A2C):   76.9 ml 38.29 ml/m RA Volume:   82.00 ml  40.83 ml/m LA Vol (A4C):   84.3 ml 41.97 ml/m LA Biplane Vol: 81.4 ml 40.53 ml/m  AORTIC VALVE AV Area (Vmax):    0.67 cm AV Area (Vmean):   0.60 cm AV Area (VTI):     0.58 cm AV Vmax:           335.60 cm/s AV Vmean:          259.200 cm/s AV VTI:            0.755 m AV Peak Grad:      45.1 mmHg AV Mean Grad:      29.8 mmHg LVOT Vmax:         71.60 cm/s LVOT Vmean:        49.200 cm/s LVOT VTI:          0.140 m LVOT/AV VTI ratio: 0.19 AR PHT:            408 msec   AORTA Ao Root diam: 3.50 cm Ao Asc diam:  4.30 cm  MITRAL VALVE              TRICUSPID VALVE MV Area (PHT):            TR Peak grad:   21.5 mmHg MV PHT:                   TR Vmax:        233.00 cm/s MV Decel Time: 123 msec MV E velocity: 84.40 cm/s MV A velocity: 45.80 cm/s MV E/A ratio:  1.84    Jenkins Rouge MD Electronically signed by Jenkins Rouge MD Signature Date/Time: 08/04/2018/3:13:47 PM    Physicians   Panel Physicians Referring Physician Case Authorizing Physician  Burnell Blanks, MD (Primary)    Procedures   RIGHT/LEFT HEART CATH AND  CORONARY ANGIOGRAPHY  Conclusion     Ost 2nd Mrg to 2nd Mrg lesion is 20% stenosed.  Prox LAD lesion is 30% stenosed.  Prox LAD to Mid LAD lesion is 30% stenosed.  Prox RCA to Mid RCA lesion is 20% stenosed.   Mild non-obstructive CAD Severe aortic stenosis by echo (cath hemodynamics: peak to peak gradient 21 mmHg, mean gradient 16.7 mmHg)  Will continue workup for TAVR   Recommendations   Antiplatelet/Anticoag Continue workup for TAVR  Indications   Severe aortic stenosis [I35.0 (ICD-10-CM)]  Procedural Details   Technical Details Indication: Severe aortic stenosis  Procedure: The risks, benefits, complications, treatment options, and expected outcomes were discussed with the patient. The patient and/or family concurred with the proposed plan, giving informed consent. The patient was brought to the cath lab after IV hydration was given. The patient was sedated with Versed and Fentanyl. The IV catheter present in the right antecubital area was prepped and draped. I then changed this for a 5 [place]  sheath over a wire. Right heart cath performed with a balloon tipped catheter. The right wrist was prepped and draped in a sterile fashion. 1% lidocaine was used for local anesthesia. Using the modified Seldinger access technique, a 5 French sheath was placed in the right radial artery. 3 mg Verapamil was given through the sheath. The sheath wire did not advance normally. I then performed an angiogram of the right radial artery which showed severe disease in the right radial artery. The right groin was prepped and draped. Using u/s guidance, I placed a 5 French sheath in the right femoral artery. Standard diagnostic catheters were used to perform selective coronary angiography. I crossed the aortic valve with the JR4 catheter. The sheath was removed from the right radial artery and a Terumo hemostasis band was applied at the arteriotomy site on the right wrist. A Mynx closure device was  placed in the right femoral artery.    Estimated blood loss <50 mL.   During this procedure medications were administered to achieve and maintain moderate conscious sedation while the patient's heart rate, blood pressure, and oxygen saturation were continuously monitored and I was present face-to-face 100% of this time.  Medications  (Filter: Administrations occurring from 10/26/18 0923 to 10/26/18 1038)  Medication Rate/Dose/Volume Action  Date Time   midazolam (VERSED) injection (mg) 1 mg Given 10/26/18 0939   Total dose as of 11/04/18 1159 1 mg Given 1014   2 mg        fentaNYL (SUBLIMAZE) injection (mcg) 25 mcg Given 10/26/18 0939   Total dose as of 11/04/18 1159 25 mcg Given 1014   50 mcg        Heparin (Porcine) in NaCl 1000-0.9 UT/500ML-% SOLN (mL) 500 mL Given 10/26/18 0950   Total dose as of 11/04/18 1159 500 mL Given 0950   1,000 mL        lidocaine (PF) (XYLOCAINE) 1 % injection (mL) 2 mL Given 10/26/18 0951   Total dose as of 11/04/18 1159 2 mL Given 0959   19 mL 15 mL Given 1012   Sedation Time   Sedation Time Physician-1: 47 minutes 19 seconds  Complications   Complications documented before study signed (10/26/2018 10:40 AM EDT)    RIGHT/LEFT HEART CATH AND CORONARY ANGIOGRAPHY   None Documented by Burnell Blanks, MD 10/26/2018 10:39 AM EDT  Time Range: Intraprocedure      Coronary Findings   Diagnostic  Dominance: Right  Left Anterior Descending  Vessel is large.  Prox LAD lesion 30% stenosed  Prox LAD lesion is 30% stenosed.  Prox LAD to Mid LAD lesion 30% stenosed  Prox LAD to Mid LAD lesion is 30% stenosed. The lesion is calcified.  First Diagonal Branch  Vessel is small in size.  Left Circumflex  First Obtuse Marginal Branch  Vessel is small in size.  Second Obtuse Marginal Branch  Vessel is large in size.  Ost 2nd Mrg to 2nd Mrg lesion 20% stenosed  Ost 2nd Mrg to 2nd Mrg lesion is 20% stenosed.  Right Coronary Artery  Vessel is large.   Prox RCA to Mid RCA lesion 20% stenosed  Prox RCA to Mid RCA lesion is 20% stenosed.  Intervention   No interventions have been documented.  Coronary Diagrams   Diagnostic  Dominance: Right    Intervention   Implants    Vascular Products  Closure Mynx Control 37f - [id number] - Implanted    Inventory item: CLOSURE MYNX CONTROL 71F Model/Cat number: [id number]  Manufacturer: [employer] Lot number: [id number]  Device identifier: [id number] Device identifier type: GS1  GUDID Information   Request status Successful    Brand name: MYNX CONTROL Version/Model: [id number]  Company name: [employer] safety info as of 10/26/18: MR Safe  Contains dry or latex rubber: No    GMDN P.T. name: Wound hydrogel dressing, sterile    As of 10/26/2018   Status: Implanted      Syngo Images   Show images for CARDIAC CATHETERIZATION  Images on Long Term Storage   Show images for Ryan, Ogborn "Richardson Landry"   Link to Procedure Log   Procedure Log    Hemo Data    Most Recent Value  Fick Cardiac Output 5.03 L/min  Fick Cardiac Output Index 2.52 (L/min)/BSA  Aortic Mean Gradient 16.67 mmHg  Aortic Peak Gradient 21 mmHg  Aortic Valve Area 1.38  Aortic Value Area Index 0.69 cm2/BSA  RA A Wave 3 mmHg  RA V Wave 4 mmHg  RA Mean 2 mmHg  RV Systolic Pressure 20 mmHg  RV Diastolic Pressure 3 mmHg  RV EDP 4 mmHg  PA Systolic Pressure 21 mmHg  PA Diastolic Pressure 7 mmHg  PA Mean 13 mmHg  PW A Wave 6 mmHg  PW V Wave 9 mmHg  PW Mean 6 mmHg  AO Systolic Pressure 423 mmHg  AO Diastolic Pressure 63 mmHg  AO Mean 80 mmHg  LV Systolic Pressure 536 mmHg  LV Diastolic Pressure 5 mmHg  LV EDP 8 mmHg  AOp Systolic Pressure 144 mmHg  AOp Diastolic Pressure 64 mmHg  AOp Mean Pressure 83 mmHg  LVp Systolic Pressure 315 mmHg  LVp Diastolic Pressure 4 mmHg  LVp EDP Pressure 8 mmHg  QP/QS 1  TPVR Index 5.16 HRUI  TSVR Index 31.8 HRUI  PVR SVR Ratio 0.09  TPVR/TSVR Ratio 0.16      ADDENDUM REPORT: 11/02/2018 00:50  CLINICAL DATA:  78 year old male with severe aortic stenosis being evaluated for a TAVR procedure.  EXAM: Cardiac TAVR CT  TECHNIQUE: The patient was scanned on a [employer]. A 120 kV retrospective scan was triggered in the descending thoracic aorta at 111 HU's. Gantry rotation speed was 250 msecs and collimation was .6 mm. No beta blockade or nitro were given. The 3D data set was reconstructed in 5% intervals of the R-R cycle. Systolic and diastolic phases were analyzed on a dedicated work station using MPR, MIP and VRT modes. The patient received 80 cc of contrast.  FINDINGS: Aortic Valve: Trileaflet aortic valve with severely thickened and calcified leaflets with severely restricted leaflets opening and no calcifications extending into the LVOT.  Aorta: Normal size with mild calcifications in the ascending aorta and moderate diffuse atherosclerotic plaque and calcifications in the aortic arch and descending aorta.  Sinotubular Junction: 32 x 30 mm  Ascending Thoracic Aorta: 40 x 39 mm  Aortic Arch: 26 x 25 mm  Descending Thoracic Aorta: 28 x 28 mm  Sinus of Valsalva Measurements:  Non-coronary: 35 mm  Right -coronary: 31 mm  Left -coronary: 34 mm  Coronary Artery Height above Annulus:  Left Main: 13 mm  Right Coronary: 14 mm  Virtual Basal Annulus Measurements:  Maximum/Minimum Diameter: 29.5 x 23.6 mm  Area derived Diameter: 25.7 mm  Perimeter: 82.5 mm  Area: 518 mm2  Optimum Fluoroscopic Angle for Delivery: LAO 10 CAU 4  IMPRESSION: 1. Trileaflet aortic valve with severely thickened and calcified leaflets with severely restricted leaflets  opening and no calcifications extending into the LVOT. Annular measurements are suitable for delivery of a 26 mm Edwards-SAPIEN 3 valve.  2. Sufficient coronary to annulus distance.  3. Optimum Fluoroscopic Angle for Delivery:  LAO  10 CAU 4.  4. No thrombus in the left atrial appendage.   Electronically Signed   By: Ena Dawley   On: 11/02/2018 00:50   Addended by Dorothy Spark, MD on 11/02/2018 12:52 AM    Study Result   EXAM: OVER-READ INTERPRETATION  CT CHEST  The following report is an over-read performed by radiologist Dr. Vinnie Langton of [facility], [place] on 10/31/2018. This over-read does not include interpretation of cardiac or coronary anatomy or pathology. The coronary calcium score/coronary CTA interpretation by the cardiologist is attached.  COMPARISON:  None.  FINDINGS: Extracardiac findings will be described separately under dictation for contemporaneously obtained CTA chest, abdomen and pelvis.  IMPRESSION: Please see separate dictation for contemporaneously obtained CTA chest, abdomen and pelvis dated 10/31/2018 for full description of relevant extracardiac findings.  Electronically Signed: By: Vinnie Langton M.D. On: 10/31/2018 13:11       CLINICAL DATA:  78 year old male with history of severe aortic stenosis. Preprocedural study prior to potential transcatheter aortic valve replacement (TAVR) procedure.  EXAM: CT ANGIOGRAPHY CHEST, ABDOMEN AND PELVIS  TECHNIQUE: Multidetector CT imaging through the chest, abdomen and pelvis was performed using the standard protocol during bolus administration of intravenous contrast. Multiplanar reconstructed images and MIPs were obtained and reviewed to evaluate the vascular anatomy.  CONTRAST:  131mL OMNIPAQUE IOHEXOL 350 MG/ML SOLN  COMPARISON:  No prior chest CT. CT of the abdomen and pelvis 05/08/2010.  FINDINGS: CTA CHEST FINDINGS  Cardiovascular: Heart size is moderately enlarged. There is no significant pericardial fluid, thickening or pericardial calcification. There is aortic atherosclerosis, as well as atherosclerosis of the great vessels of the mediastinum and the coronary arteries,  including calcified atherosclerotic plaque in the left main, left anterior descending, left circumflex and right coronary arteries. Ectasia of the ascending thoracic aorta (4.2 cm in diameter). Severe thickening calcification of the aortic valve.  Mediastinum/Lymph Nodes: No pathologically enlarged mediastinal or hilar lymph nodes. Esophagus is unremarkable in appearance. No axillary lymphadenopathy.  Lungs/Pleura: Irregular-shaped nodule in the anterior aspect of the lingula (axial image 44 of series 15 and coronal image 48 of series 16) measuring 2.4 x 2.3 x 1.4 cm, with macrolobulated and slightly spiculated margins, concerning for potential neoplasm. No acute consolidative airspace disease. No pleural effusions.  Musculoskeletal/Soft Tissues: There are no aggressive appearing lytic or blastic lesions noted in the visualized portions of the skeleton.  CTA ABDOMEN AND PELVIS FINDINGS  Hepatobiliary: No suspicious cystic or solid hepatic lesions. No intra or extrahepatic biliary ductal dilatation. Gallbladder is normal in appearance.  Pancreas: No pancreatic mass. No pancreatic ductal dilatation. No pancreatic or peripancreatic fluid or inflammatory changes.  Spleen: Unremarkable.  Adrenals/Urinary Tract: Well-defined 1.9 cm left adrenal nodule, incompletely characterized, but stable compared to remote prior study from 05/08/2010 at which point this was previously characterized as an adenoma. Subcentimeter low-attenuation lesion in the interpolar region of the left kidney, too small to characterize, but statistically likely to represent a tiny cyst. Right kidney and bilateral adrenal glands are normal in appearance. No hydroureteronephrosis. Urinary bladder is normal in appearance.  Stomach/Bowel: Normal appearance of the stomach. No pathologic dilatation of small bowel or colon. Normal appendix.  Vascular/Lymphatic: Aortic atherosclerosis with  postprocedural changes of aorto bi-iliac bypass graft which is patent  bilaterally. Fusiform aneurysmal dilatation of the infrarenal abdominal aorta with the aneurysm sac measuring up to 4.5 x 3.6 cm (axial image 131 of series 14), increased in size compared to prior study from 05/08/2010. Vascular findings and measurements pertinent to potential TAVR procedure, as detailed below. There are no aggressive appearing lytic or blastic lesions noted in the visualized portions of the skeleton.  Reproductive: Brachytherapy implants throughout the prostate gland. Seminal vesicles are unremarkable in appearance.  Other: No significant volume of ascites.  No pneumoperitoneum.  Musculoskeletal: There are no aggressive appearing lytic or blastic lesions noted in the visualized portions of the skeleton.  VASCULAR MEASUREMENTS PERTINENT TO TAVR:  AORTA:  Minimal Aortic Diameter-16 x 19 mm  Severity of Aortic Calcification-mild-to-moderate  RIGHT PELVIS:  Right Common Iliac Artery -  Minimal Diameter-10.9 x 8.8 mm  Tortuosity-mild  Calcification-stent  Right External Iliac Artery -  Minimal Diameter-8.2 x 6.3 mm  Tortuosity-severe  Calcification-mild  Right Common Femoral Artery -  Minimal Diameter-7.7 x 7.7 mm  Tortuosity-mild  Calcification-moderate  LEFT PELVIS:  Left Common Iliac Artery -  Minimal Diameter-9.7 x 8.2 mm  Tortuosity-mild  Calcification-stent  Left External Iliac Artery -  Minimal Diameter-7.2 x 6.6 mm  Tortuosity-severe  Calcification-moderate  Left Common Femoral Artery -  Minimal Diameter-8.8 x 7.8 mm  Tortuosity-mild  Calcification-moderate  Review of the MIP images confirms the above findings.  IMPRESSION: 1. Vascular findings and measurements pertinent to potential TAVR procedure, as detailed above. 2. Severe thickening calcification of the aortic valve, compatible with reported clinical  history of severe aortic stenosis. 3. 2.4 x 2.3 x 1.4 cm aggressive appearing left upper lobe nodule, highly concerning for primary bronchogenic neoplasm. Further evaluation with PET-CT should be considered in the near future. 4. 4.5 x 3.6 cm fusiform aneurysmal dilatation of the infrarenal abdominal aorta in this patient status post aorto bi-iliac bypass graft. Notably, this appears larger than remote prior study from 05/08/2010, which may suggest endoleak (no definitive signs of endoleak are confidently identified on today's arterial phase examination). 5. 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 6. Additional incidental findings, as above.   Electronically Signed   By: Vinnie Langton M.D.   On: 10/31/2018 14:59  Impression:  This 78 year old gentleman has stage D, severe, symptomatic aortic stenosis with New York Heart Association class II symptoms of exertional fatigue and shortness of breath consistent with chronic diastolic congestive heart failure.  He has mild left ventricular systolic dysfunction with mild left ventricular dilatation.  I have personally reviewed his 2D echocardiogram, cardiac catheterization and CTA studies.  His echocardiogram shows a trileaflet aortic valve with severe calcification and thickening of the leaflets with reduced mobility.  The mean gradient is 41 mmHg consistent with severe aortic stenosis.  There is mild dilatation of the aortic root with a diameter of 43 mm.  Cardiac catheterization shows mild nonobstructive coronary disease.  I agree that aortic valve placement is indicated in this patient to improve his symptoms and quality of life as well as to prevent progressive left ventricular deterioration.  I think transcatheter aortic valve replacement would be the best treatment option at his age with his comorbid risk factors.  His gated cardiac CTA shows anatomy that is suitable for transcatheter aortic valve replacement.  He is  abdominal and pelvic CTA shows adequate pelvic vascular anatomy to allow transfemoral insertion.  He does have an aortoiliac stent graft in place but the lumen and the iliac arteries appears  large enough to get a transcatheter valve through them safely.  Unfortunately the CT of his chest shows a 2.4 x 2.3 cm macrolobulated and slightly spiculated lung mass in the anterior aspect of the lingula that is suspicious for lung cancer.  He also has adenopathy in the mediastinum, left hilum, and aortopulmonary region.  I think he should have a PET scan first to evaluate this lung mass further and assess for the likelihood of metastatic disease before proceeding with transcatheter aortic valve replacement.  I reviewed the CTA studies with him and answered all of his questions.   Plan:  He will be scheduled for a PET scan and I will see him back in the office after that to review the results with him and decide on the next steps in his treatment plan.  I spent 60 minutes performing this consultation and > 50% of this time was spent face to face counseling and coordinating the care of this patient's severe aortic stenosis and left upper lobe lung mass.    Gaye Pollack, MD 11/03/2018 1:58 PM

## 2018-11-10 ENCOUNTER — Other Ambulatory Visit: Payer: Self-pay

## 2018-11-10 ENCOUNTER — Ambulatory Visit (HOSPITAL_COMMUNITY)
Admission: RE | Admit: 2018-11-10 | Discharge: 2018-11-10 | Disposition: A | Discharge status: Home / Self Care | Payer: Medicare Other | Source: Ambulatory Visit | Attending: Surgery | Admitting: Surgery

## 2018-11-10 DIAGNOSIS — R911 Solitary pulmonary nodule: Secondary | ICD-10-CM | POA: Insufficient documentation

## 2018-11-10 DIAGNOSIS — I2510 Atherosclerotic heart disease of native coronary artery without angina pectoris: Secondary | ICD-10-CM | POA: Diagnosis not present

## 2018-11-10 DIAGNOSIS — E279 Disorder of adrenal gland, unspecified: Secondary | ICD-10-CM | POA: Diagnosis not present

## 2018-11-10 DIAGNOSIS — M40294 Other kyphosis, thoracic region: Secondary | ICD-10-CM | POA: Diagnosis not present

## 2018-11-10 DIAGNOSIS — K5900 Constipation, unspecified: Secondary | ICD-10-CM | POA: Diagnosis not present

## 2018-11-10 DIAGNOSIS — I700 Atherosclerosis of aorta: Secondary | ICD-10-CM | POA: Diagnosis not present

## 2018-11-10 DIAGNOSIS — I517 Cardiomegaly: Secondary | ICD-10-CM | POA: Diagnosis not present

## 2018-11-10 LAB — GLUCOSE, CAPILLARY: Glucose-Capillary: 146 mg/dL — ABNORMAL HIGH (ref 70–99)

## 2018-11-10 IMAGING — PT NUCLEAR MEDICINE PET IMAGE INITIAL (PI) SKULL BASE TO THIGH
1 of 8 series · 3 of 16 positions shown, 4 images · non-contrast
Comparison: [DATE]

CLINICAL DATA: Initial treatment strategy for solitary pulmonary
nodule.

EXAM:
NUCLEAR MEDICINE PET SKULL BASE TO THIGH
TECHNIQUE: 9.4 mCi F-18 FDG was injected intravenously. Full-ring PET imaging
was performed from the skull base to thigh after the radiotracer. CT
data was obtained and used for attenuation correction and anatomic
localization.
Fasting blood glucose: 146 mg/dl

[Series 4: ct sk_thigh 5.0 b31f · axial · 0.98mm/px · z∈[-902,-18]mm · 3 of 221 slices shown, 4 images]
[im 1/221  soft-tissue]
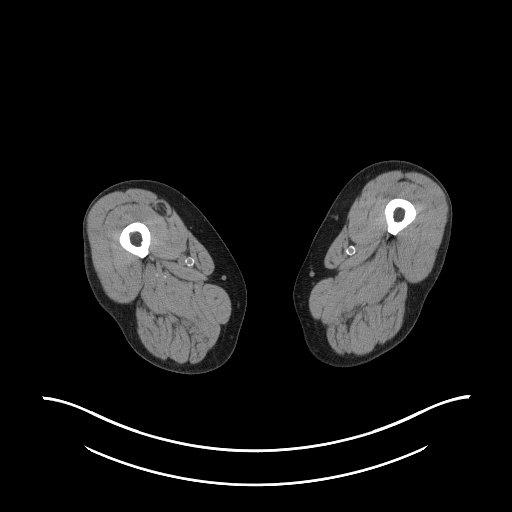
[im 1/221  bone]
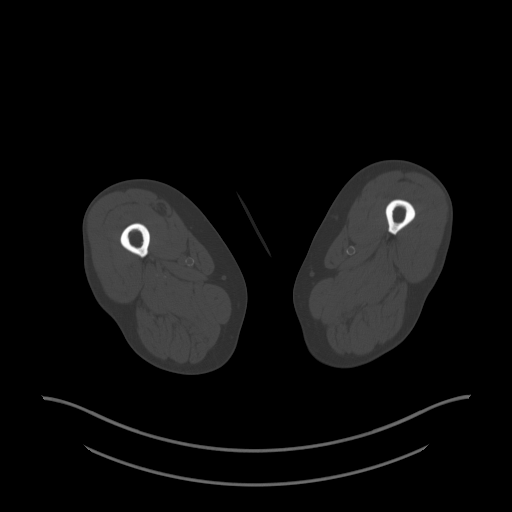
[im 111/221  soft-tissue]
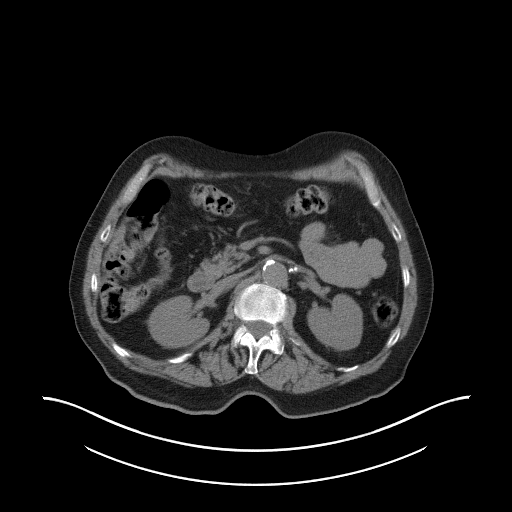
[im 221/221  soft-tissue]
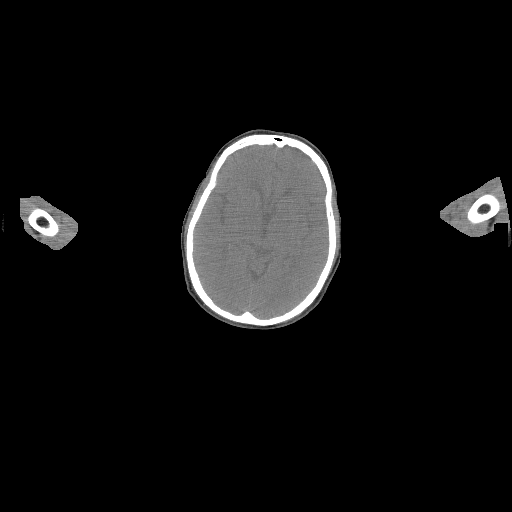

[3 of 16 positions shown; findings below may reference images not displayed]

FINDINGS: Mediastinal blood pool activity: SUV max

Liver activity: SUV max NA

NECK: No significant abnormal hypermetabolic activity in this
region.

Incidental CT findings: Mild chronic right ethmoid sinusitis.

CHEST: The [DATE] by 2.2 cm left upper lobe nodule has a maximum SUV of
4.7

AP window lymph node 0.9 cm in short axis on image 61/4, maximum SUV
2.3.

Right lower paratracheal node 0.9 cm in short axis on image 61/4,
maximum SUV 2.9.

Incidental CT findings: Coronary, aortic arch, and branch vessel
atherosclerotic vascular disease. Mild cardiomegaly. Calcifications
of the aortic valve.

ABDOMEN/PELVIS: Scattered accentuated activity in bowel, likely
physiologic. The left adrenal mass measures 2.0 by 2.0 cm with
internal density of 24 Hounsfield units and a maximum SUV of 3.9,
this lesion is overall stable in size from [DATE] and considered
benign.

Incidental CT findings: Aortoiliac atherosclerotic vascular disease.
Aortoiliac stent graft traversing infrarenal abdominal aortic
aneurysm. Prominent stool throughout the colon favors constipation.
Prostate brachytherapy seeds noted.

SKELETON: No significant abnormal hypermetabolic activity in this
region.

Incidental CT findings: Thoracic kyphosis. Mild levoconvex lumbar
scoliosis with rotary component. Calcifications in the soft tissues
of the right inferior buttock, probably from heterotopic
ossification or dystrophic calcification.
IMPRESSION: 1. The anterior left upper lobe nodule is hypermetabolic with SUV of
4.7, highly suspicious for malignancy.
2. Generally low-grade activity in the upper normal size mediastinal
lymph nodes, similar to or below the blood pool activity.
3. Other imaging findings of potential clinical significance: Aortic
Atherosclerosis ([7U]-[7U]). Coronary atherosclerosis. Mild
cardiomegaly. Aortic valve calcification. Chronically stable 2.0 cm
left adrenal mass, considered benign. Prominent stool throughout the
colon favors constipation. Aortoiliac stent graft.

## 2018-11-10 MED ORDER — FLUDEOXYGLUCOSE F - 18 (FDG) INJECTION
9.3800 | Freq: Once | INTRAVENOUS | Status: AC | PRN
  Administered 2018-11-10: 9.38 via INTRAVENOUS

## 2018-11-14 ENCOUNTER — Encounter: Payer: Self-pay | Admitting: Surgery

## 2018-11-14 ENCOUNTER — Ambulatory Visit (INDEPENDENT_AMBULATORY_CARE_PROVIDER_SITE_OTHER): Payer: Medicare Other | Admitting: Surgery

## 2018-11-14 ENCOUNTER — Other Ambulatory Visit: Payer: Self-pay

## 2018-11-14 VITALS — BP 117/74 | HR 80 | Temp 97.7°F | Resp 20 | Ht 69.0 in | Wt 180.0 lb

## 2018-11-14 DIAGNOSIS — I350 Nonrheumatic aortic (valve) stenosis: Secondary | ICD-10-CM

## 2018-11-14 DIAGNOSIS — R911 Solitary pulmonary nodule: Secondary | ICD-10-CM

## 2018-11-14 NOTE — Progress Notes (Signed)
HPI:  The patient returns today for review of his PET scan and to decide on further treatment for his left upper lobe lung mass and severe aortic stenosis.  The PET scan shows the left upper lobe nodule to be hypermetabolic consistent with bronchogenic carcinoma.  There are borderline enlarged lymph nodes in the anterior mediastinum and aortopulmonary window which have low-grade hypermetabolic activity which is similar to or below the blood pool activity and therefore not diagnostic of metastatic carcinoma.  Current Outpatient Medications  Medication Sig Dispense Refill  . dorzolamide (TRUSOPT) 2 % ophthalmic solution Place 1 drop into both eyes 3 (three) times daily.    Marland Kitchen esomeprazole (NEXIUM) 40 MG capsule Take 40 mg by mouth daily.     Marland Kitchen glimepiride (AMARYL) 2 MG tablet Take 2 mg by mouth daily.    Marland Kitchen ibuprofen (ADVIL,MOTRIN) 200 MG tablet Take 200 mg by mouth daily as needed for headache or moderate pain.    . metFORMIN (GLUCOPHAGE-XR) 500 MG 24 hr tablet Take 1,000 mg by mouth every evening.     . metoprolol succinate (TOPROL-XL) 25 MG 24 hr tablet Take 1 tablet (25 mg total) by mouth daily.    . simvastatin (ZOCOR) 40 MG tablet Take 40 mg by mouth at bedtime.      Alveda Reasons 20 MG TABS tablet TAKE 1 TABLET BY MOUTH  DAILY WITH SUPPER (Patient taking differently: Take 20 mg by mouth daily with supper. ) 90 tablet 1   Current Facility-Administered Medications  Medication Dose Route Frequency Provider Last Rate Last Dose  . 0.9 %  sodium chloride infusion  500 mL Intravenous Continuous Irene Shipper, MD         Physical Exam: BP 117/74   Pulse 80   Temp 97.7 F (36.5 C) (Skin)   Resp 20   Ht 5\' 9"  (1.753 m)   Wt 180 lb (81.6 kg)   SpO2 95% Comment: RA  BMI 26.58 kg/m  He looks well. Cardiac exam shows a regular rate and rhythm with a grade 3/6 systolic murmur along the right sternal border. Lungs are clear.  Diagnostic Tests:  CLINICAL DATA:  Initial treatment strategy  for solitary pulmonary nodule.  EXAM: NUCLEAR MEDICINE PET SKULL BASE TO THIGH  TECHNIQUE: 9.4 mCi F-18 FDG was injected intravenously. Full-ring PET imaging was performed from the skull base to thigh after the radiotracer. CT data was obtained and used for attenuation correction and anatomic localization.  Fasting blood glucose: 146 mg/dl  COMPARISON:  10/31/2026  FINDINGS: Mediastinal blood pool activity: SUV max 2.8  Liver activity: SUV max NA  NECK: No significant abnormal hypermetabolic activity in this region.  Incidental CT findings: Mild chronic right ethmoid sinusitis.  CHEST: The 2.6 by 2.2 cm left upper lobe nodule has a maximum SUV of 4.7  AP window lymph node 0.9 cm in short axis on image 61/4, maximum SUV 2.3.  Right lower paratracheal node 0.9 cm in short axis on image 61/4, maximum SUV 2.9.  Incidental CT findings: Coronary, aortic arch, and branch vessel atherosclerotic vascular disease. Mild cardiomegaly. Calcifications of the aortic valve.  ABDOMEN/PELVIS: Scattered accentuated activity in bowel, likely physiologic. 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  Incidental CT findings: Aortoiliac atherosclerotic vascular disease. Aortoiliac stent graft traversing infrarenal abdominal aortic aneurysm. Prominent stool throughout  the colon favors constipation. Prostate brachytherapy seeds noted.  SKELETON: No significant abnormal hypermetabolic activity in this region.  Incidental CT findings: Thoracic kyphosis. Mild levoconvex lumbar scoliosis with rotary component. Calcifications in the soft tissues of the right inferior buttock, probably from heterotopic ossification or dystrophic calcification.  IMPRESSION: 1. The anterior left upper lobe nodule is hypermetabolic with SUV of  4.7, highly suspicious for malignancy. 2. Generally low-grade activity in the upper normal size mediastinal lymph nodes, similar to or below the blood pool activity. 3. Other imaging findings of potential clinical significance: Aortic Atherosclerosis (ICD10-I70.0). Coronary atherosclerosis. Mild cardiomegaly. Aortic valve calcification. 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 Prominent stool throughout the colon favors constipation. Aortoiliac stent graft.   Electronically Signed   By: Van Clines M.D.   On: 11/10/2018 14:45   Impression:  This 78 year old gentleman has severe, symptomatic aortic stenosis as well as a 2.5 cm left upper lobe lung mass that is hypermetabolic on PET scan and suspicious for bronchogenic carcinoma.  He has borderline enlarged mediastinal and AP window lymph nodes which are not definitely hypermetabolic on PET scan and therefore nondiagnostic.  They are larger than I typically see in this location and therefore could have microscopic metastasis or could just be inflammatory.  There are no evidence of distant metastases on PET scan.  Therefore I think would be best to proceed with transcatheter aortic valve replacement.  After a few week recovery I would proceed with bronchoscopy and mediastinoscopy to assess the mediastinal lymph nodes for microscopic metastasis and if negative consider resection of the left upper lobe lung mass by wedge resection or lobectomy depending on his pulmonary function test.  I reviewed the PET scan images with him and answered all of his questions.  I discussed the reasoning for this treatment plan and the possibility that he may still have lymph node metastasis although the lymph nodes were not clearly hypermetabolic on PET scan.  He understands and would like to proceed with TAVR followed by treatment of his lung mass as outlined above.  Plan:  He will be scheduled for transfemoral transcatheter aortic  valve replacement in the near future.  He is on Xarelto for atrial fibrillation and atrial flutter and that will have to be discontinued preoperatively.  I spent 10 minutes performing this established patient evaluation and > 50% of this time was spent face to face counseling and coordinating the care of this patient's left upper lobe lung mass and severe aortic stenosis.    Gaye Pollack, MD [facility] [phone]

## 2018-11-17 DIAGNOSIS — E7800 Pure hypercholesterolemia, unspecified: Secondary | ICD-10-CM | POA: Diagnosis not present

## 2018-11-17 DIAGNOSIS — E7849 Other hyperlipidemia: Secondary | ICD-10-CM | POA: Diagnosis not present

## 2018-11-17 DIAGNOSIS — H409 Unspecified glaucoma: Secondary | ICD-10-CM | POA: Diagnosis not present

## 2018-11-17 DIAGNOSIS — C61 Malignant neoplasm of prostate: Secondary | ICD-10-CM | POA: Diagnosis not present

## 2018-11-17 DIAGNOSIS — E139 Other specified diabetes mellitus without complications: Secondary | ICD-10-CM | POA: Diagnosis not present

## 2018-11-17 DIAGNOSIS — I10 Essential (primary) hypertension: Secondary | ICD-10-CM | POA: Diagnosis not present

## 2018-11-17 DIAGNOSIS — E119 Type 2 diabetes mellitus without complications: Secondary | ICD-10-CM | POA: Diagnosis not present

## 2018-11-17 DIAGNOSIS — E1169 Type 2 diabetes mellitus with other specified complication: Secondary | ICD-10-CM | POA: Diagnosis not present

## 2018-11-17 DIAGNOSIS — I480 Paroxysmal atrial fibrillation: Secondary | ICD-10-CM | POA: Diagnosis not present

## 2018-11-23 ENCOUNTER — Other Ambulatory Visit: Payer: Self-pay

## 2018-11-23 DIAGNOSIS — I350 Nonrheumatic aortic (valve) stenosis: Secondary | ICD-10-CM

## 2018-11-30 NOTE — Pre-Procedure Instructions (Addendum)
[place] [address], [place] [address] Phone: [phone] Fax: [phone]      Your procedure is scheduled on Tuesday 12-06-18   Report to [facility] Main Entrance "A" at 0700 A.M., and check in at the Admitting office.  Call this number if you have problems the morning of surgery:  [phone]  Call [phone] if you have any questions prior to your surgery date Monday-Friday 8am-4pm    Remember:  Do not eat or drink after midnight the night before your surgery.    Take these medicines the morning of surgery with A SIP OF WATER :NONE  As per your surgeon's instructions, stop taking Xarelto on 12/01/18.  As of today, STOP taking any Aspirin (unless otherwise instructed by your surgeon), Aleve, Naproxen, Ibuprofen, Motrin, Advil, Goody's, BC's, all herbal medications, fish oil, and all vitamins.   WHAT DO I DO ABOUT MY DIABETES MEDICATION?   Marland Kitchen Stop taking METFORMIN on 12/04/18  Can take morning dose of Glimepride the day before surgery.  How to Manage Your Diabetes Before and After Surgery  Why is it important to control my blood sugar before and after surgery? . Improving blood sugar levels before and after surgery helps healing and can limit problems. . A way of improving blood sugar control is eating a healthy diet by: o  Eating less sugar and carbohydrates o  Increasing activity/exercise o  Talking with your doctor about reaching your blood sugar goals . High blood sugars (greater than 180 mg/dL) can raise your risk of infections and slow your recovery, so you will need to focus on controlling your diabetes during the weeks before surgery. . Make sure that the doctor who takes care of your diabetes knows about your planned surgery including the date and location.  How do I manage my blood sugar before surgery? . Check your blood sugar at least 4 times a day, starting 2 days before surgery, to make  sure that the level is not too high or low. o Check your blood sugar the morning of your surgery when you wake up and every 2 hours until you get to the Short Stay unit. . If your blood sugar is less than 70 mg/dL, you will need to treat for low blood sugar: o Do not take insulin. o Treat a low blood sugar (less than 70 mg/dL) with  cup of clear juice (cranberry or apple), 4 glucose tablets, OR glucose gel. o Recheck blood sugar in 15 minutes after treatment (to make sure it is greater than 70 mg/dL). If your blood sugar is not greater than 70 mg/dL on recheck, call [phone] for further instructions. . Report your blood sugar to the short stay nurse when you get to Short Stay.  . If you are admitted to the hospital after surgery: o Your blood sugar will be checked by the staff and you will probably be given insulin after surgery (instead of oral diabetes medicines) to make sure you have good blood sugar levels. o The goal for blood sugar control after surgery is 80-180 mg/dL.  The Morning of Surgery  Do not wear jewelry, make-up or nail polish.  Do not wear lotions, powders, or perfumes/colognes, or deodorant             Men may shave face and neck.  Do not bring valuables to the hospital.  [facility] is not responsible for any belongings or valuables.  If  you are a smoker, DO NOT Smoke 24 hours prior to surgery IF you wear a CPAP at night please bring your mask, tubing, and machine the morning of surgery   Remember that you must have someone to transport you home after your surgery, and remain with you for 24 hours if you are discharged the same day.   Contacts, glasses, hearing aids, dentures or bridgework may not be worn into surgery.    Leave your suitcase in the car.  After surgery it may be brought to your room.  For patients admitted to the hospital, discharge time will be determined by your treatment team.  Patients discharged the day of surgery will not be allowed to  drive home.    Special instructions:   [facility]- Preparing For Surgery  Before surgery, you can play an important role. Because skin is not sterile, your skin needs to be as free of germs as possible. You can reduce the number of germs on your skin by washing with CHG (chlorahexidine gluconate) Soap before surgery.  CHG is an antiseptic cleaner which kills germs and bonds with the skin to continue killing germs even after washing.    Oral Hygiene is also important to reduce your risk of infection.  Remember - BRUSH YOUR TEETH THE MORNING OF SURGERY WITH YOUR REGULAR TOOTHPASTE  Please do not use if you have an allergy to CHG or antibacterial soaps. If your skin becomes reddened/irritated stop using the CHG.  Do not shave (including legs and underarms) for at least 48 hours prior to first CHG shower. It is OK to shave your face.  Please follow these instructions carefully.   1. Shower the NIGHT BEFORE SURGERY and the MORNING OF SURGERY with CHG Soap.   2. If you chose to wash your hair, wash your hair first as usual with your normal shampoo.  3. After you shampoo, rinse your hair and body thoroughly to remove the shampoo.  4. Use CHG as you would any other liquid soap. You can apply CHG directly to the skin and wash gently with a scrungie or a clean washcloth.   5. Apply the CHG Soap to your body ONLY FROM THE NECK DOWN.  Do not use on open wounds or open sores. Avoid contact with your eyes, ears, mouth and genitals (private parts). Wash Face and genitals (private parts)  with your normal soap.   6. Wash thoroughly, paying special attention to the area where your surgery will be performed.  7. Thoroughly rinse your body with warm water from the neck down.  8. DO NOT shower/wash with your normal soap after using and rinsing off the CHG Soap.  9. Pat yourself dry with a CLEAN TOWEL.  10. Wear CLEAN PAJAMAS to bed the night before surgery, wear comfortable clothes the morning of  surgery  11. Place CLEAN SHEETS on your bed the night of your first shower and DO NOT SLEEP WITH PETS.  Day of Surgery:  Do not apply any deodorants/lotions. Please shower the morning of surgery with the CHG soap  Please wear clean clothes to the hospital/surgery center.   Remember to brush your teeth WITH YOUR REGULAR TOOTHPASTE.   Please read over the  fact sheets that you were given.

## 2018-12-01 ENCOUNTER — Encounter (HOSPITAL_COMMUNITY): Payer: Self-pay

## 2018-12-01 ENCOUNTER — Other Ambulatory Visit (HOSPITAL_COMMUNITY): Payer: Medicare Other

## 2018-12-01 ENCOUNTER — Ambulatory Visit (HOSPITAL_COMMUNITY)
Admission: RE | Admit: 2018-12-01 | Discharge: 2018-12-01 | Disposition: A | Discharge status: Home / Self Care | Payer: Medicare Other | Source: Ambulatory Visit | Attending: Cardiovascular Disease | Admitting: Cardiovascular Disease

## 2018-12-01 ENCOUNTER — Other Ambulatory Visit: Payer: Self-pay

## 2018-12-01 ENCOUNTER — Encounter (HOSPITAL_COMMUNITY)
Admission: RE | Admit: 2018-12-01 | Discharge: 2018-12-01 | Disposition: A | Discharge status: Home / Self Care | Payer: Medicare Other | Source: Ambulatory Visit | Attending: Cardiovascular Disease | Admitting: Cardiovascular Disease

## 2018-12-01 DIAGNOSIS — I4891 Unspecified atrial fibrillation: Secondary | ICD-10-CM | POA: Diagnosis not present

## 2018-12-01 DIAGNOSIS — Z1159 Encounter for screening for other viral diseases: Secondary | ICD-10-CM | POA: Insufficient documentation

## 2018-12-01 DIAGNOSIS — Z01818 Encounter for other preprocedural examination: Secondary | ICD-10-CM | POA: Insufficient documentation

## 2018-12-01 DIAGNOSIS — R9431 Abnormal electrocardiogram [ECG] [EKG]: Secondary | ICD-10-CM | POA: Insufficient documentation

## 2018-12-01 DIAGNOSIS — I350 Nonrheumatic aortic (valve) stenosis: Secondary | ICD-10-CM

## 2018-12-01 LAB — COMPREHENSIVE METABOLIC PANEL
ALT: 15 U/L (ref 0–44)
AST: 22 U/L (ref 15–41)
Albumin: 4.1 g/dL (ref 3.5–5.0)
Alkaline Phosphatase: 59 U/L (ref 38–126)
Anion gap: 13 (ref 5–15)
BUN: 17 mg/dL (ref 8–23)
CO2: 17 mmol/L — ABNORMAL LOW (ref 22–32)
Calcium: 9.6 mg/dL (ref 8.9–10.3)
Chloride: 106 mmol/L (ref 98–111)
Creatinine, Ser: 0.85 mg/dL (ref 0.61–1.24)
GFR calc Af Amer: >60 mL/min (ref >60)
GFR calc non Af Amer: >60 mL/min (ref >60)
Glucose, Bld: 183 mg/dL — ABNORMAL HIGH (ref 70–99)
Potassium: 4.3 mmol/L (ref 3.5–5.1)
Sodium: 136 mmol/L (ref 135–145)
Total Bilirubin: 1 mg/dL (ref 0.3–1.2)
Total Protein: 7.3 g/dL (ref 6.5–8.1)

## 2018-12-01 LAB — URINALYSIS, ROUTINE W REFLEX MICROSCOPIC
Bilirubin Urine: NEGATIVE
Glucose, UA: NEGATIVE mg/dL
Hgb urine dipstick: NEGATIVE
Ketones, ur: NEGATIVE mg/dL
Leukocytes,Ua: NEGATIVE
Nitrite: NEGATIVE
Protein, ur: NEGATIVE mg/dL
Specific Gravity, Urine: 1.023 (ref 1.005–1.030)
pH: 5 (ref 5.0–8.0)

## 2018-12-01 LAB — CBC
HCT: 43.3 % (ref 39.0–52.0)
Hemoglobin: 13.5 g/dL (ref 13.0–17.0)
MCH: 30.7 pg (ref 26.0–34.0)
MCHC: 31.2 g/dL (ref 30.0–36.0)
MCV: 98.4 fL (ref 80.0–100.0)
Platelets: 188 10*3/uL (ref 150–400)
RBC: 4.4 MIL/uL (ref 4.22–5.81)
RDW: 13.2 % (ref 11.5–15.5)
WBC: 8.6 10*3/uL (ref 4.0–10.5)
nRBC: 0 % (ref 0.0–0.2)

## 2018-12-01 LAB — TYPE AND SCREEN
ABO/RH(D): O POS
Antibody Screen: NEGATIVE

## 2018-12-01 LAB — BLOOD GAS, ARTERIAL
Acid-base deficit: 2.7 mmol/L — ABNORMAL HIGH (ref 0.0–2.0)
Bicarbonate: 21.2 mmol/L (ref 20.0–28.0)
Drawn by: 265211
FIO2: 21
O2 Saturation: 98.8 %
Patient temperature: 98.6
pCO2 arterial: 34.1 mmHg (ref 32.0–48.0)
pH, Arterial: 7.41 (ref 7.350–7.450)
pO2, Arterial: 151 mmHg — ABNORMAL HIGH (ref 83.0–108.0)

## 2018-12-01 LAB — BRAIN NATRIURETIC PEPTIDE: B Natriuretic Peptide: 72.4 pg/mL (ref 0.0–100.0)

## 2018-12-01 LAB — SURGICAL PCR SCREEN
MRSA, PCR: NEGATIVE
Staphylococcus aureus: POSITIVE — AB

## 2018-12-01 LAB — APTT: aPTT: 35 seconds (ref 24–36)

## 2018-12-01 LAB — GLUCOSE, CAPILLARY: Glucose-Capillary: 202 mg/dL — ABNORMAL HIGH (ref 70–99)

## 2018-12-01 LAB — HEMOGLOBIN A1C
Hgb A1c MFr Bld: 7.6 % — ABNORMAL HIGH (ref 4.8–5.6)
Mean Plasma Glucose: 171.42 mg/dL

## 2018-12-01 IMAGING — CR CHEST - 2 VIEW
2 series · 2 of 2 positions shown · non-contrast
Comparison: [DATE]

CLINICAL DATA: Preop evaluation for TAVR

EXAM:
CHEST - 2 VIEW

[w chest pa]
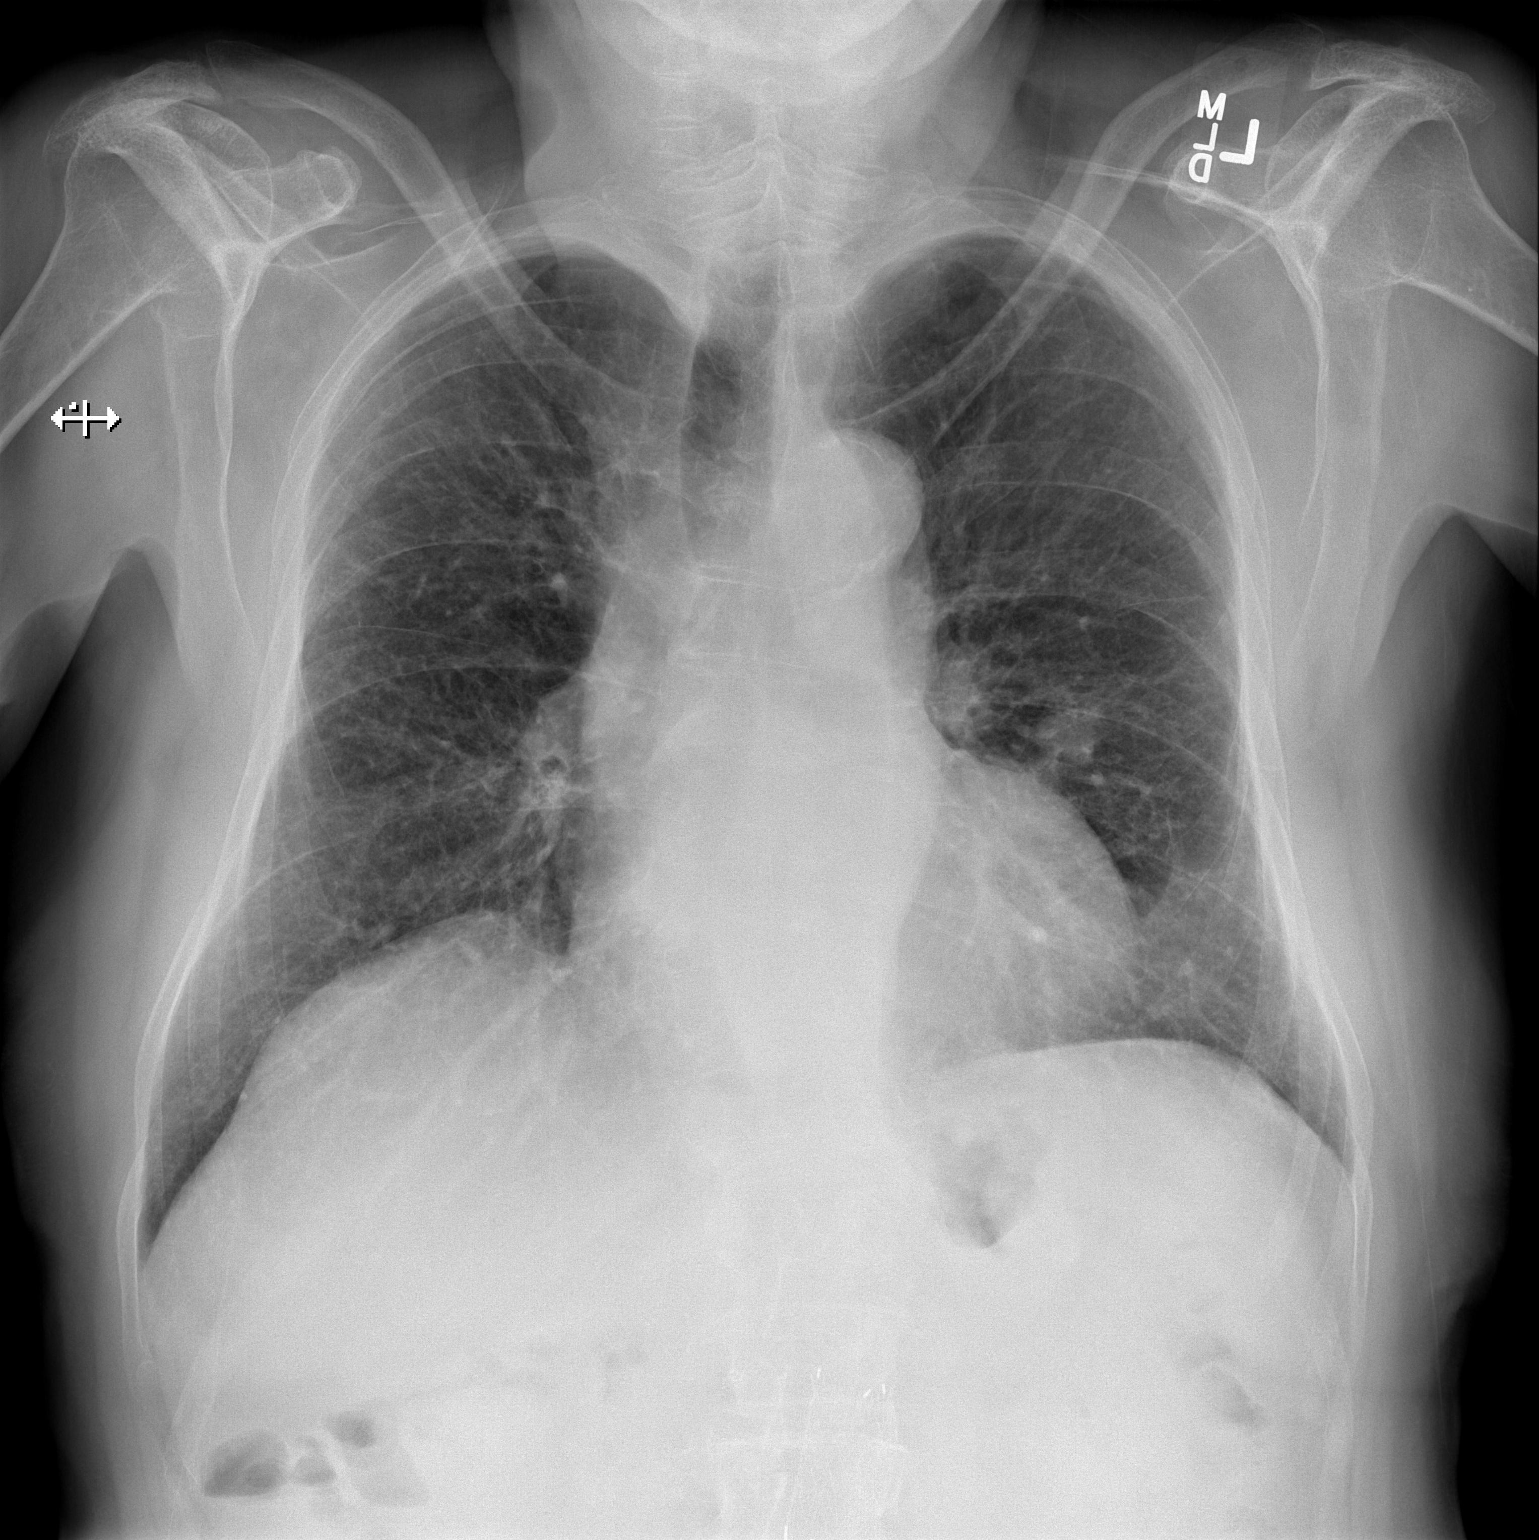

[w chest lat]
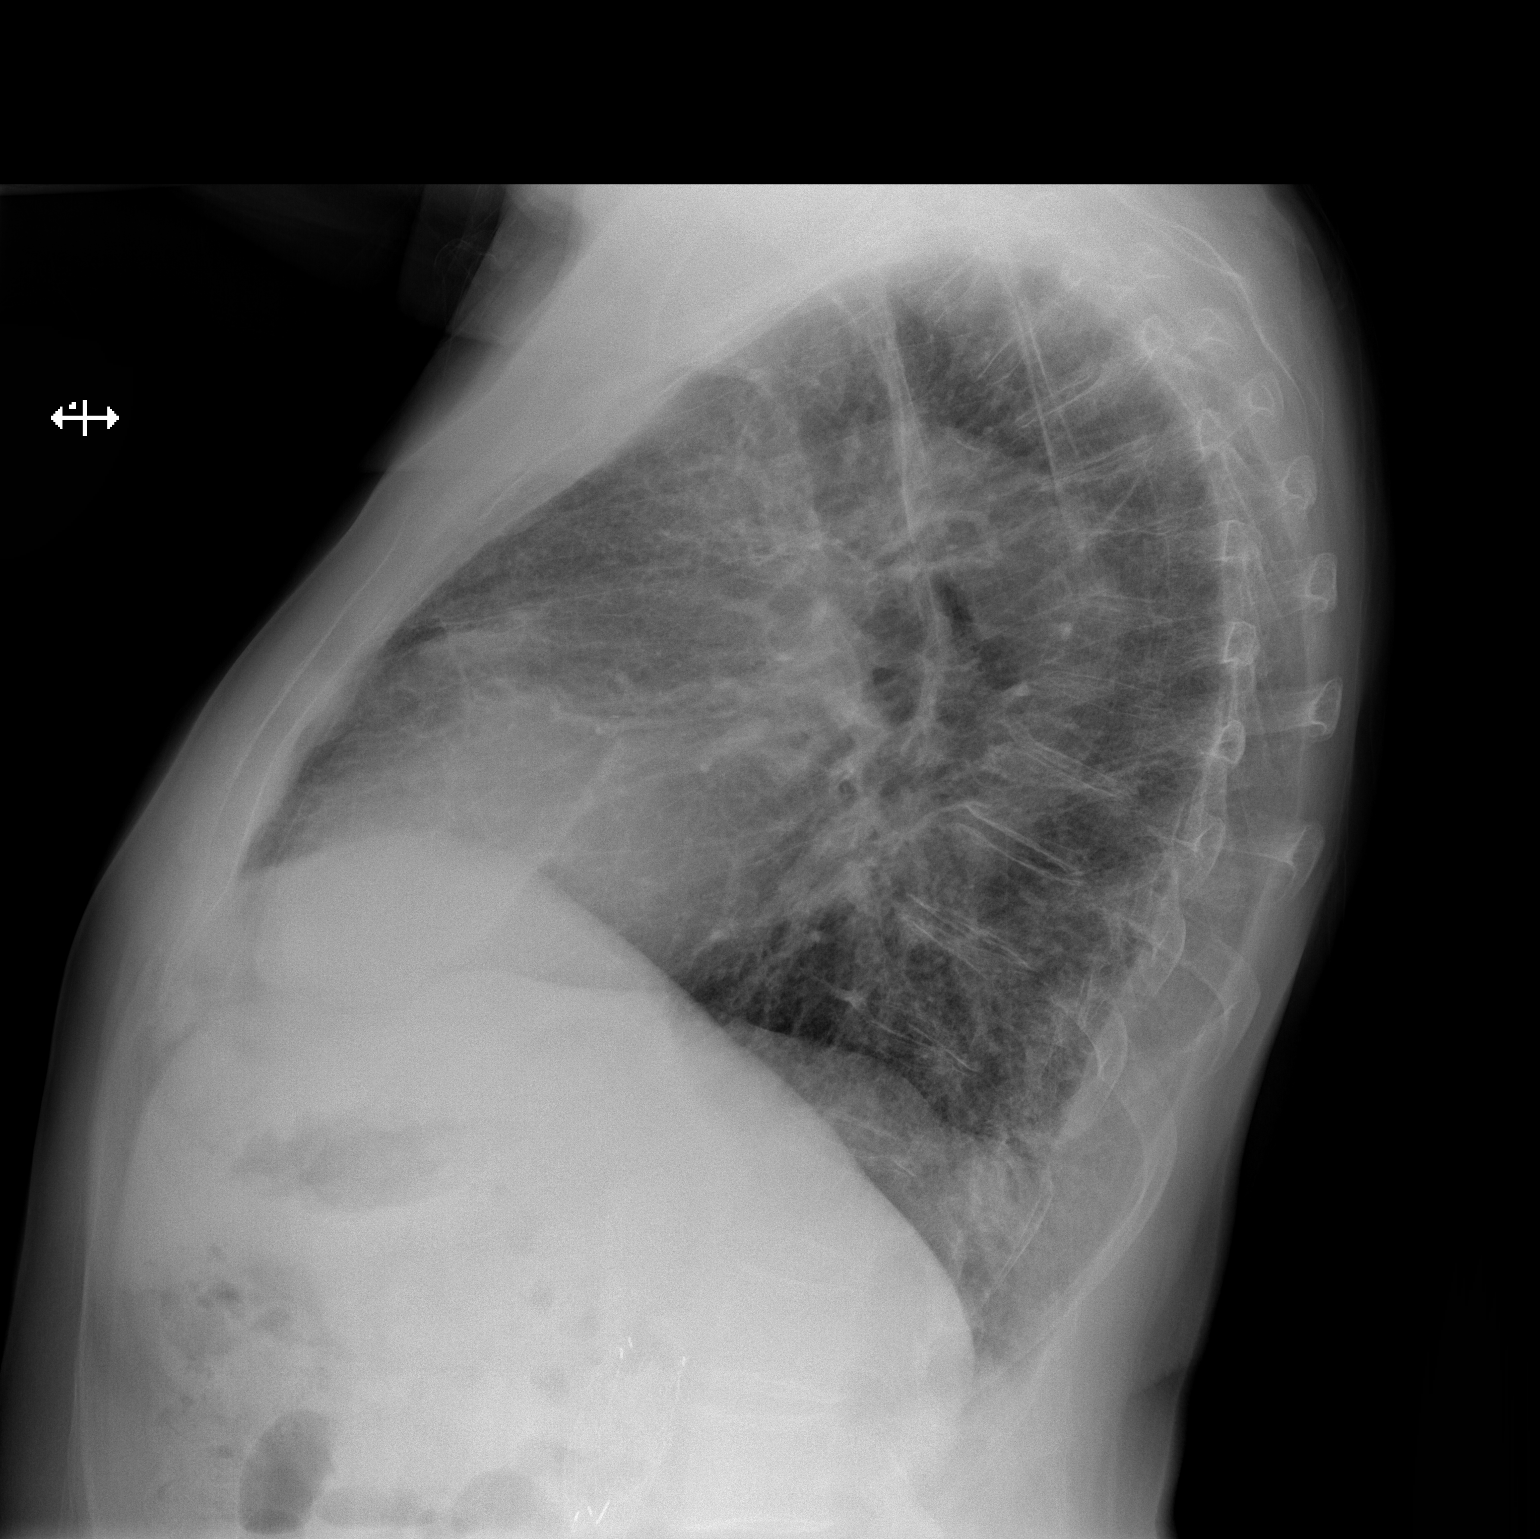

[2 of 2 positions shown; findings below may reference images not displayed]

FINDINGS: Cardiac shadow is within normal limits. Aortic calcifications are
seen. The known lingular lesion is not as well appreciated on this
exam is on prior CTs. No effusion is seen. No bony abnormality is
noted.
IMPRESSION: Known lingular lesion is not as well appreciated on today's exam. No
acute abnormality noted. The

## 2018-12-01 NOTE — Progress Notes (Addendum)
  Coronavirus Screening COVID test scheduled on 12/02/18 at [facility] Have you experienced the following symptoms:  Cough yes/no: No Fever (>100.75F)  yes/no: No Runny nose yes/no: No Sore throat yes/no: No Difficulty breathing/shortness of breath  yes/no: No Have you or a family member traveled in the last 14 days and where? yes/no: No   PCP - Dr. [name]  Cardiologist - Dr Candee Furbish  Chest x-ray - 12-01-18  EKG - 12-01-18  Stress Test - 11/20(epic)  ECHO - 08-04-18 (Epic)  Cardiac Cath - 10-26-18  AICD-denies PM-denies LOOP-denies  Sleep Study - denies CPAP - denies  LABS-PCR,CBC,CMP,A1C,BNP,APTT,ABG,UA,T/S Positive for MSSA. Rx called into pharmacy and patient notified. ASA-denies  XARELTO-LD 12/01/18. Will check PT-INR DOS  ERAS-NA  HA1C-12-01-18 =7.6. Notified Charlena Cross from Dr. Vivi Martens office. Fasting Blood Sugar - 202 Checks Blood Sugar _2____ times a day. Lowest 64. Highest 226 Per order Metformin to be stopped on 12/04/18. Anesthesia-Y. Cardiac history  Pt denies having chest pain, palpitations,dizziness, sob, or fever at this time.Does not give history of unusual bleeding. All instructions explained to the pt, with a verbal understanding of the material. Pt agrees to go over the instructions while at home for a better understanding. Pt also instructed to self quarantine after being tested for COVID-19. The opportunity to ask questions was provided.

## 2018-12-02 ENCOUNTER — Other Ambulatory Visit (HOSPITAL_COMMUNITY)
Admission: RE | Admit: 2018-12-02 | Discharge: 2018-12-02 | Disposition: A | Discharge status: Home / Self Care | Payer: Medicare Other | Source: Ambulatory Visit | Attending: Cardiovascular Disease | Admitting: Cardiovascular Disease

## 2018-12-02 DIAGNOSIS — R9431 Abnormal electrocardiogram [ECG] [EKG]: Secondary | ICD-10-CM | POA: Diagnosis not present

## 2018-12-02 DIAGNOSIS — Z01818 Encounter for other preprocedural examination: Secondary | ICD-10-CM | POA: Diagnosis not present

## 2018-12-02 DIAGNOSIS — Z1159 Encounter for screening for other viral diseases: Secondary | ICD-10-CM | POA: Diagnosis not present

## 2018-12-02 DIAGNOSIS — I350 Nonrheumatic aortic (valve) stenosis: Secondary | ICD-10-CM

## 2018-12-02 DIAGNOSIS — I4891 Unspecified atrial fibrillation: Secondary | ICD-10-CM | POA: Diagnosis not present

## 2018-12-02 LAB — SARS CORONAVIRUS 2 (TAT 6-24 HRS): SARS Coronavirus 2: NEGATIVE

## 2018-12-05 DIAGNOSIS — H409 Unspecified glaucoma: Secondary | ICD-10-CM | POA: Diagnosis not present

## 2018-12-05 DIAGNOSIS — E1169 Type 2 diabetes mellitus with other specified complication: Secondary | ICD-10-CM | POA: Diagnosis not present

## 2018-12-05 DIAGNOSIS — R918 Other nonspecific abnormal finding of lung field: Secondary | ICD-10-CM | POA: Diagnosis not present

## 2018-12-05 DIAGNOSIS — I10 Essential (primary) hypertension: Secondary | ICD-10-CM | POA: Diagnosis not present

## 2018-12-05 DIAGNOSIS — F17210 Nicotine dependence, cigarettes, uncomplicated: Secondary | ICD-10-CM | POA: Diagnosis not present

## 2018-12-05 DIAGNOSIS — F419 Anxiety disorder, unspecified: Secondary | ICD-10-CM | POA: Diagnosis not present

## 2018-12-05 DIAGNOSIS — I480 Paroxysmal atrial fibrillation: Secondary | ICD-10-CM | POA: Diagnosis not present

## 2018-12-05 DIAGNOSIS — E7800 Pure hypercholesterolemia, unspecified: Secondary | ICD-10-CM | POA: Diagnosis not present

## 2018-12-05 MED ORDER — SODIUM CHLORIDE 0.9 % IV SOLN
1.5000 g | INTRAVENOUS | Status: AC
  Administered 2018-12-06: 09:00:00 1.5 g via INTRAVENOUS
  Filled 2018-12-05 (×3): qty 1.5

## 2018-12-05 MED ORDER — NOREPINEPHRINE 4 MG/250ML-% IV SOLN
0.0000 ug/min | INTRAVENOUS | Status: AC
  Administered 2018-12-06: 2 ug/min via INTRAVENOUS
  Filled 2018-12-05: qty 250

## 2018-12-05 MED ORDER — VANCOMYCIN HCL 10 G IV SOLR
1250.0000 mg | INTRAVENOUS | Status: AC
  Administered 2018-12-06: 1250 mg via INTRAVENOUS
  Filled 2018-12-05 (×2): qty 1250

## 2018-12-05 MED ORDER — DEXMEDETOMIDINE HCL IN NACL 400 MCG/100ML IV SOLN
0.1000 ug/kg/h | INTRAVENOUS | Status: AC
  Administered 2018-12-06: 1 ug/kg/h via INTRAVENOUS
  Filled 2018-12-05 (×2): qty 100

## 2018-12-05 MED ORDER — MAGNESIUM SULFATE 50 % IJ SOLN
40.0000 meq | INTRAMUSCULAR | Status: DC
  Filled 2018-12-05 (×2): qty 9.85

## 2018-12-05 MED ORDER — SODIUM CHLORIDE 0.9 % IV SOLN
INTRAVENOUS | Status: DC
  Filled 2018-12-05 (×2): qty 30

## 2018-12-05 MED ORDER — POTASSIUM CHLORIDE 2 MEQ/ML IV SOLN
80.0000 meq | INTRAVENOUS | Status: DC
  Filled 2018-12-05 (×2): qty 40

## 2018-12-05 NOTE — H&P (Signed)
[address]       [place] [address]             [phone]      Cardiothoracic Surgery Admission History and Physical    Referring Provider is Jerline Pain, MD  Primary Cardiologist is Candee Furbish, MD  PCP is [name], MD      Chief Complaint  Patient presents with   Aortic Stenosis    TAVR CONSULT...review completed required studies/tests  HPI:  The patient is a 78 year old gentleman with a history of hypertension, hyperlipidemia, diabetes, ongoing smoking with COPD, persistent atrial fibrillation on anticoagulation, abdominal aortic aneurysm s/p EVAR and moderate aortic stenosis diagnosed by echocardiogram in May 2017 when the mean gradient was 25 mmHg. The patient now has progressive exertional fatigue and shortness of breath over the past several months and a follow-up echocardiogram on 08/04/2018 showed progression to severe aortic stenosis with a mean gradient of 41 mmHg, a peak gradient of 61 mmHg, and a dimensionless index of 0.19. Left ventricular ejection fraction is mildly reduced at 45 to 50% with mild left ventricular dilatation. Cardiac catheterization on 10/26/2018 showed mild nonobstructive coronary disease. The mean gradient and cath was 16.7 mmHg with a peak to peak gradient of 21 mmHg.   The PET scan shows the left upper lobe nodule to be hypermetabolic consistent with bronchogenic carcinoma.  There are borderline enlarged lymph nodes in the anterior mediastinum and aortopulmonary window which have low-grade hypermetabolic activity which is similar to or below the blood pool activity and therefore not diagnostic of metastatic carcinoma.     Past Medical History:  Diagnosis Date   AAA (abdominal aortic aneurysm) (HCC)    Allergy, unspecified not elsewhere classified    Anxiety    Arthritis    Arthropathy, unspecified, site unspecified    Asthma    when younger   Atrial fibrillation (HCC)    CAD (coronary artery disease)     Cataract    removed both   COPD (chronic obstructive pulmonary disease) (HCC)    Diabetes mellitus    Esophageal reflux    Family history of malignant neoplasm of gastrointestinal tract    Hiatal hernia    Hyperlipemia    Hypertension    Malignant neoplasm of prostate ([place])    prostate    Peripheral vascular disease ([place])    Stricture and stenosis of esophagus         Past Surgical History:  Procedure Laterality Date   ABDOMINAL AORTA STENT     CARDIOVERSION N/A 12/04/2015   Procedure: CARDIOVERSION; Surgeon: Sanda Klein, MD; Location: [facility]; Service: Cardiovascular; Laterality: N/A;   COLONOSCOPY     FINGER SURGERY     INSERTION PROSTATE RADIATION SEED     RIGHT/LEFT HEART CATH AND CORONARY ANGIOGRAPHY N/A 10/26/2018   Procedure: RIGHT/LEFT HEART CATH AND CORONARY ANGIOGRAPHY; Surgeon: Burnell Blanks, MD; Location: [facility]; Service: Cardiovascular; Laterality: N/A;   UPPER GASTROINTESTINAL ENDOSCOPY          Family History  Problem Relation Age of Onset   Colon cancer Father    questionable   Diabetes Mother    Heart disease Mother    Heart Disease before age 31   Deep vein thrombosis Mother    Hyperlipidemia Mother    Hypertension Mother    Varicose Veins Mother    Hypertension Brother    Heart attack Brother    Prostate cancer Brother  Colon polyps Neg Hx    Esophageal cancer Neg Hx    Rectal cancer Neg Hx    Stomach cancer Neg Hx    Social History        Socioeconomic History   Marital status: Married    Spouse name: Not on file   Number of children: 2   Years of education: Not on file   Highest education level: Not on file  Occupational History   Occupation: engineer-retired    Employer: [employer] strain: Not on file   Food insecurity:    Worry: Not on file    Inability: Not on file   Transportation needs:    Medical: Not on file    Non-medical:  Not on file  Tobacco Use   Smoking status: Light Tobacco Smoker    Types: Pipe, Cigars   Smokeless tobacco: Never Used  Substance and Sexual Activity   Alcohol use: No    Alcohol/week: 0.0 standard drinks   Drug use: No   Sexual activity: Not on file  Lifestyle   Physical activity:    Days per week: Not on file    Minutes per session: Not on file   Stress: Not on file  Relationships   Social connections:    Talks on phone: Not on file    Gets together: Not on file    Attends religious service: Not on file    Active member of club or organization: Not on file    Attends meetings of clubs or organizations: Not on file    Relationship status: Not on file   Intimate partner violence:    Fear of current or ex partner: Not on file    Emotionally abused: Not on file    Physically abused: Not on file    Forced sexual activity: Not on file  Other Topics Concern   Not on file  Social History Narrative   Not on file         Current Outpatient Medications  Medication Sig Dispense Refill   dorzolamide (TRUSOPT) 2 % ophthalmic solution Place 1 drop into both eyes 3 (three) times daily.     esomeprazole (NEXIUM) 40 MG capsule Take 40 mg by mouth daily.      glimepiride (AMARYL) 2 MG tablet Take 2 mg by mouth daily.     ibuprofen (ADVIL,MOTRIN) 200 MG tablet Take 200 mg by mouth daily as needed for headache or moderate pain.     metFORMIN (GLUCOPHAGE-XR) 500 MG 24 hr tablet Take 1,000 mg by mouth every evening.      metoprolol succinate (TOPROL-XL) 25 MG 24 hr tablet Take 1 tablet (25 mg total) by mouth daily.     simvastatin (ZOCOR) 40 MG tablet Take 40 mg by mouth at bedtime.      XARELTO 20 MG TABS tablet TAKE 1 TABLET BY MOUTH DAILY WITH SUPPER (Patient taking differently: Take 20 mg by mouth daily with supper. ) 90 tablet 1            Current Facility-Administered Medications  Medication Dose Route Frequency Provider Last Rate Last Dose   0.9 % sodium chloride  infusion 500 mL Intravenous Continuous Irene Shipper, MD    No Known Allergies  Review of Systems:  General: Normal appetite, + decreased energy, no weight gain, no weight loss, no fever  Cardiac: no chest pain with exertion, no chest pain at rest, +SOB with exertion, no resting SOB, no PND,  no orthopnea, no palpitations, + arrhythmia, + atrial fibrillation, + LE edema, + dizzy most of the time, no syncope  Respiratory: + shortness of breath, no home oxygen, no productive cough, no dry cough, no bronchitis, no wheezing, no hemoptysis, no asthma, no pain with inspiration or cough, no sleep apnea, no CPAP at night  GI: no difficulty swallowing, no reflux, no frequent heartburn, no hiatal hernia, no abdominal pain, no constipation, no diarrhea, no hematochezia, no hematemesis, no melena  GU: no dysuria, no frequency, no urinary tract infection, no hematuria, no enlarged prostate, no kidney stones, no kidney disease  Vascular: no pain suggestive of claudication, no pain in feet, no leg cramps, no varicose veins, no DVT, no non-healing foot ulcer  Neuro: no stroke, no TIA's, no seizures, no headaches, no temporary blindness one eye, no slurred speech, no peripheral neuropathy, no chronic pain, no instability of gait, no memory/cognitive dysfunction  Musculoskeletal: no arthritis, no joint swelling, no myalgias, no difficulty walking, norma mobility  Skin: no rash, no itching, no skin infections, no pressure sores or ulcerations  Psych: no anxiety, no depression, no nervousness, no unusual recent stress  Eyes: no blurry vision, no floaters, no recent vision changes, + wears glasses or contacts  ENT: no hearing loss, no loose or painful teeth, no dentures  Hematologic: no easy bruising, no abnormal bleeding, no clotting disorder, no frequent epistaxis  Endocrine: + diabetes, does check CBG's at home  Physical Exam:  BP 108/80 (BP Location: Left Arm, Patient Position: Sitting, Cuff Size: Normal)   Pulse 86    Temp 97.7 F (36.5 C) (Skin)   Resp 16   Ht 5\' 9"  (1.753 m)   Wt 188 lb (85.3 kg)   SpO2 96% Comment: ON RA   BMI 27.76 kg/m  General: Elderly but well-appearing  HEENT: Unremarkable, NCAT, PERLA, EOMI  Neck: no JVD, no bruits, no adenopathy or thyromegaly  Chest: clear to auscultation, symmetrical breath sounds, no wheezes, no rhonchi  CV: RRR, grade lll/VI crescendo/decrescendo murmur heard best at RSB, no diastolic murmur  Abdomen: soft, non-tender, no masses  Extremities: warm, well-perfused, pulses palpable, no LE edema  Rectal/GU Deferred  Neuro: Grossly non-focal and symmetrical throughout  Skin: Clean and dry, no rashes, no breakdown  Diagnostic Tests:  Patient Name: Vincent Velez Date of Exam: 08/04/2018  Medical Rec #: [id number] Height: 69.0 in  Accession #: [id number] Weight: 187.1 lb  Date of Birth: 1941/05/26 BSA: 2.01 m  Patient Age: 72 years BP: 140/80 mmHg  Patient Gender: M HR: 90 bpm.  Exam Location: [address]  Procedure: 2D Echo, Cardiac Doppler and Color Doppler  Indications: I35.0 Aortic Stenosis  History: Patient has prior history of Echocardiogram examinations, most  recent 10/08/2015. Risk Factors: Hypertension, Dyslipidemia and  Current Smoker. AAA. Asthma. Atrial Fibrillation. Coronary  artery disease. COPD. Murmur. Peripheral vascular disease.  Sonographer: Wilford Sports Rodgers-Jones RDCS  Referring Phys: [id number] MARK C SKAINS  IMPRESSIONS  1. The left ventricle has mildly reduced systolic function, with an ejection fraction of 45-50%. The cavity size was mildly dilated. Left ventricular diastolic parameters were normal Left ventrical global hypokinesis without regional wall motion  abnormalities.  2. The right ventricle has normal systolic function. The cavity was normal. There is no increase in right ventricular wall thickness.  3. Left atrial size was moderately dilated.  4. The mitral valve is normal in structure. Mild thickening of the mitral valve  leaflet.  5. The tricuspid valve is normal in structure.  6. The aortic valve is tricuspid Severely thickening of the aortic valve Severe calcifcation of the aortic valve. Aortic valve regurgitation is mild by color flow Doppler. severe stenosis of the aortic valve.  7. Peak velocity from 3 chamber 3.9 m/sec mean gradient 41 mmHg peak 61 mmHg DVI .19 Severe AS especially given decreased EF and morphology.  8. The pulmonic valve was normal in structure.  9. 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  FINDINGS  Left Ventricle: The left ventricle has mildly reduced systolic function, with an ejection fraction of 45-50%. The cavity size was mildly dilated. There is no increase in left ventricular wall thickness. Left ventricular diastolic parameters were normal  Left ventrical global hypokinesis without regional wall motion abnormalities.  Right Ventricle: The right ventricle has normal systolic function. The cavity was normal. There is no increase in right ventricular wall thickness.  Left Atrium: left atrial size was moderately dilated  Right Atrium: right atrial size was normal in size.  Interatrial Septum: No atrial level shunt detected by color flow Doppler.  Pericardium: There is no evidence of pericardial effusion.  Mitral Valve: The mitral valve is normal in structure. Mild thickening of the mitral valve leaflet. Mitral valve regurgitation is trivial by color flow Doppler.  Tricuspid Valve: The tricuspid valve is normal in structure. Tricuspid valve regurgitation was not visualized by color flow Doppler.  Aortic Valve: The aortic valve is tricuspid Severely thickening of the aortic valve Severe calcifcation of the aortic valve. Aortic valve regurgitation is mild by color flow Doppler. There is severe stenosis of the aortic valve, with a calculated valve  area of 0.58 cm. Peak velocity from 3 chamber 3.9 m/sec mean gradient 41 mmHg peak 61 mmHg DVI .19 Severe AS especially  given decreased EF and morphology.  Pulmonic Valve: The pulmonic valve was normal in structure. Pulmonic valve regurgitation is not visualized by color flow Doppler.  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  Venous: The inferior vena cava is normal in size with greater than 50% respiratory variability.  LEFT VENTRICLE  PLAX 2D (Teich)  LV EF: 36.0 % Diastology  LVIDd: 5.70 cm LV e' lateral: 9.90 cm/s  LVIDs: 4.70 cm LV E/e' lateral: 8.5  LV PW: 0.70 cm LV e' medial: 7.29 cm/s  LV IVS: 1.00 cm LV E/e' medial: 11.6  LVOT diam: 2.00 cm  LV SV: 58 ml  LVOT Area: 3.14 cm  RIGHT VENTRICLE  RV Basal diam: 4.10 cm  RV S prime: 11.70 cm/s  LEFT ATRIUM Index RIGHT ATRIUM Index  LA diam: 4.80 cm 2.39 cm/m RA Area: 22.90 cm  LA Vol (A2C): 76.9 ml 38.29 ml/m RA Volume: 82.00 ml 40.83 ml/m  LA Vol (A4C): 84.3 ml 41.97 ml/m  LA Biplane Vol: 81.4 ml 40.53 ml/m  AORTIC VALVE  AV Area (Vmax): 0.67 cm  AV Area (Vmean): 0.60 cm  AV Area (VTI): 0.58 cm  AV Vmax: 335.60 cm/s  AV Vmean: 259.200 cm/s  AV VTI: 0.755 m  AV Peak Grad: 45.1 mmHg  AV Mean Grad: 29.8 mmHg  LVOT Vmax: 71.60 cm/s  LVOT Vmean: 49.200 cm/s  LVOT VTI: 0.140 m  LVOT/AV VTI ratio: 0.19  AR PHT: 408 msec  AORTA  Ao Root diam: 3.50 cm  Ao Asc diam: 4.30 cm  MITRAL VALVE TRICUSPID VALVE  MV Area (PHT): TR Peak grad: 21.5 mmHg  MV PHT: TR Vmax: 233.00 cm/s  MV Decel Time: 123  msec  MV E velocity: 84.40 cm/s  MV A velocity: 45.80 cm/s  MV E/A ratio: 1.84  Jenkins Rouge MD  Electronically signed by Jenkins Rouge MD  Signature Date/Time: 08/04/2018/3:13:47 PM  Physicians  Panel Physicians Referring Physician Case Authorizing Physician  Burnell Blanks, MD (Primary)    Procedures  RIGHT/LEFT HEART CATH AND CORONARY ANGIOGRAPHY  Conclusion  Ost 2nd Mrg to 2nd Mrg lesion is 20% stenosed.  Prox LAD lesion is 30% stenosed.  Prox LAD to Mid LAD lesion is 30% stenosed.  Prox RCA  to Mid RCA lesion is 20% stenosed. Mild non-obstructive CAD  Severe aortic stenosis by echo (cath hemodynamics: peak to peak gradient 21 mmHg, mean gradient 16.7 mmHg)  Will continue workup for TAVR   Recommendations  Antiplatelet/Anticoag Continue workup for TAVR  Indications  Severe aortic stenosis [I35.0 (ICD-10-CM)]  Procedural Details  Technical Details Indication: Severe aortic stenosis  Procedure: The risks, benefits, complications, treatment options, and expected outcomes were discussed with the patient. The patient and/or family concurred with the proposed plan, giving informed consent. The patient was brought to the cath lab after IV hydration was given. The patient was sedated with Versed and Fentanyl. The IV catheter present in the right antecubital area was prepped and draped. I then changed this for a 5 French sheath over a wire. Right heart cath performed with a balloon tipped catheter. The right wrist was prepped and draped in a sterile fashion. 1% lidocaine was used for local anesthesia. Using the modified Seldinger access technique, a 5 French sheath was placed in the right radial artery. 3 mg Verapamil was given through the sheath. The sheath wire did not advance normally. I then performed an angiogram of the right radial artery which showed severe disease in the right radial artery. The right groin was prepped and draped. Using u/s guidance, I placed a 5 French sheath in the right femoral artery. Standard diagnostic catheters were used to perform selective coronary angiography. I crossed the aortic valve with the JR4 catheter. The sheath was removed from the right radial artery and a Terumo hemostasis band was applied at the arteriotomy site on the right wrist. A Mynx closure device was placed in the right femoral artery.    Estimated blood loss <50 mL.   During this procedure medications were administered to achieve and maintain moderate conscious sedation while the patient's  heart rate, blood pressure, and oxygen saturation were continuously monitored and I was present face-to-face 100% of this time.  Medications  (Filter: Administrations occurring from 10/26/18 0923 to 10/26/18 1038)          Medication Rate/Dose/Volume Action  Date Time   midazolam (VERSED) injection (mg) 1 mg Given 10/26/18 0939   Total dose as of 11/04/18 1159 1 mg Given 1014   2 mg        fentaNYL (SUBLIMAZE) injection (mcg) 25 mcg Given 10/26/18 0939   Total dose as of 11/04/18 1159 25 mcg Given 1014   50 mcg        Heparin (Porcine) in NaCl 1000-0.9 UT/500ML-% SOLN (mL) 500 mL Given 10/26/18 0950   Total dose as of 11/04/18 1159 500 mL Given 0950   1,000 mL        lidocaine (PF) (XYLOCAINE) 1 % injection (mL) 2 mL Given 10/26/18 0951   Total dose as of 11/04/18 1159 2 mL Given 0959   19 mL 15 mL Given 1012   Sedation Time  Sedation Time Physician-1: 47 minutes  19 seconds  Complications  Complications documented before study signed (10/26/2018 10:40 AM EDT)   RIGHT/LEFT HEART CATH AND CORONARY ANGIOGRAPHY   None Documented by Burnell Blanks, MD 10/26/2018 10:39 AM EDT  Time Range: Intraprocedure    Coronary Findings  Diagnostic  Dominance: Right  Left Anterior Descending  Vessel is large.  Prox LAD lesion 30% stenosed  Prox LAD lesion is 30% stenosed.  Prox LAD to Mid LAD lesion 30% stenosed  Prox LAD to Mid LAD lesion is 30% stenosed. The lesion is calcified.  First Diagonal Branch  Vessel is small in size.  Left Circumflex  First Obtuse Marginal Branch  Vessel is small in size.  Second Obtuse Marginal Branch  Vessel is large in size.  Ost 2nd Mrg to 2nd Mrg lesion 20% stenosed  Ost 2nd Mrg to 2nd Mrg lesion is 20% stenosed.  Right Coronary Artery  Vessel is large.  Prox RCA to Mid RCA lesion 20% stenosed  Prox RCA to Mid RCA lesion is 20% stenosed.  Intervention  No interventions have been documented.  Coronary Diagrams  Diagnostic  Dominance: Right    Intervention  Implants     Vascular Products  Closure Mynx Control 77f - [id number] - Implanted    Inventory item: CLOSURE [facility] CONTROL 61F Model/Cat number: [id number]  Manufacturer: [employer] Lot number: [id number]  Device identifier: [id number] Device identifier type: GS1  GUDID Information   Request status Successful    Brand name: MYNX CONTROL Version/Model: [id number]  Company name: [employer] safety info as of 10/26/18: MR Safe  Contains dry or latex rubber: No    GMDN P.T. name: Wound hydrogel dressing, sterile    As of 10/26/2018    Status: Implanted     Syngo Images   Link to Procedure Log    Show images for CARDIAC CATHETERIZATION  Procedure Log   Images on Long Term Storage     Show images for Awais, Cobarrubias "Richardson Landry"    Hemo Data   Most Recent Value  Fick Cardiac Output 5.03 L/min  Fick Cardiac Output Index 2.52 (L/min)/BSA  Aortic Mean Gradient 16.67 mmHg  Aortic Peak Gradient 21 mmHg  Aortic Valve Area 1.38  Aortic Value Area Index 0.69 cm2/BSA  RA A Wave 3 mmHg  RA V Wave 4 mmHg  RA Mean 2 mmHg  RV Systolic Pressure 20 mmHg  RV Diastolic Pressure 3 mmHg  RV EDP 4 mmHg  PA Systolic Pressure 21 mmHg  PA Diastolic Pressure 7 mmHg  PA Mean 13 mmHg  PW A Wave 6 mmHg  PW V Wave 9 mmHg  PW Mean 6 mmHg  AO Systolic Pressure 329 mmHg  AO Diastolic Pressure 63 mmHg  AO Mean 80 mmHg  LV Systolic Pressure 518 mmHg  LV Diastolic Pressure 5 mmHg  LV EDP 8 mmHg  AOp Systolic Pressure 841 mmHg  AOp Diastolic Pressure 64 mmHg  AOp Mean Pressure 83 mmHg  LVp Systolic Pressure 660 mmHg  LVp Diastolic Pressure 4 mmHg  LVp EDP Pressure 8 mmHg  QP/QS 1  TPVR Index 5.16 HRUI  TSVR Index 31.8 HRUI  PVR SVR Ratio 0.09  TPVR/TSVR Ratio 0.16   ADDENDUM REPORT: 11/02/2018 00:50  CLINICAL DATA: 78 year old male with severe aortic stenosis being  evaluated for a TAVR procedure.  EXAM:  Cardiac TAVR CT  TECHNIQUE:  The patient was scanned on a  [employer]. A 120 kV  retrospective scan was triggered in  the descending thoracic aorta at  111 HU's. Gantry rotation speed was 250 msecs and collimation was .6  mm. No beta blockade or nitro were given. The 3D data set was  reconstructed in 5% intervals of the R-R cycle. Systolic and  diastolic phases were analyzed on a dedicated work station using  MPR, MIP and VRT modes. The patient received 80 cc of contrast.  FINDINGS:  Aortic Valve: Trileaflet aortic valve with severely thickened and  calcified leaflets with severely restricted leaflets opening and no  calcifications extending into the LVOT.  Aorta: Normal size with mild calcifications in the ascending aorta  and moderate diffuse atherosclerotic plaque and calcifications in  the aortic arch and descending aorta.  Sinotubular Junction: 32 x 30 mm  Ascending Thoracic Aorta: 40 x 39 mm  Aortic Arch: 26 x 25 mm  Descending Thoracic Aorta: 28 x 28 mm  Sinus of Valsalva Measurements:  Non-coronary: 35 mm  Right -coronary: 31 mm  Left -coronary: 34 mm  Coronary Artery Height above Annulus:  Left Main: 13 mm  Right Coronary: 14 mm  Virtual Basal Annulus Measurements:  Maximum/Minimum Diameter: 29.5 x 23.6 mm  Area derived Diameter: 25.7 mm  Perimeter: 82.5 mm  Area: 518 mm2  Optimum Fluoroscopic Angle for Delivery: LAO 10 CAU 4  IMPRESSION:  1. Trileaflet aortic valve with severely thickened and calcified  leaflets with severely restricted leaflets opening and no  calcifications extending into the LVOT. Annular measurements are  suitable for delivery of a 26 mm Edwards-SAPIEN 3 valve.  2. Sufficient coronary to annulus distance.  3. Optimum Fluoroscopic Angle for Delivery: LAO 10 CAU 4.  4. No thrombus in the left atrial appendage.  Electronically Signed  By: Ena Dawley  On: 11/02/2018 00:50   Addended by Dorothy Spark, MD on 11/02/2018 12:52 AM  Study Result   EXAM:  OVER-READ INTERPRETATION CT CHEST    The following report is an over-read performed by radiologist Dr.  Vinnie Langton of [facility], [place] on 10/31/2018. This  over-read does not include interpretation of cardiac or coronary  anatomy or pathology. The coronary calcium score/coronary CTA  interpretation by the cardiologist is attached.  COMPARISON: None.  FINDINGS:  Extracardiac findings will be described separately under dictation  for contemporaneously obtained CTA chest, abdomen and pelvis.  IMPRESSION:  Please see separate dictation for contemporaneously obtained CTA  chest, abdomen and pelvis dated 10/31/2018 for full description of  relevant extracardiac findings.  Electronically Signed:  By: Vinnie Langton M.D.  On: 10/31/2018 13:11   CLINICAL DATA: 78 year old male with history of severe aortic  stenosis. Preprocedural study prior to potential transcatheter  aortic valve replacement (TAVR) procedure.  EXAM:  CT ANGIOGRAPHY CHEST, ABDOMEN AND PELVIS  TECHNIQUE:  Multidetector CT imaging through the chest, abdomen and pelvis was  performed using the standard protocol during bolus administration of  intravenous contrast. Multiplanar reconstructed images and MIPs were  obtained and reviewed to evaluate the vascular anatomy.  CONTRAST: 130mL OMNIPAQUE IOHEXOL 350 MG/ML SOLN  COMPARISON: No prior chest CT. CT of the abdomen and pelvis  05/08/2010.  FINDINGS:  CTA CHEST FINDINGS  Cardiovascular: Heart size is moderately enlarged. There is no  significant pericardial fluid, thickening or pericardial  calcification. There is aortic atherosclerosis, as well as  atherosclerosis of the great vessels of the mediastinum and the  coronary arteries, including calcified atherosclerotic plaque in the  left main, left anterior descending, left circumflex and right  coronary arteries. Ectasia of the  ascending thoracic aorta (4.2 cm  in diameter). Severe thickening calcification of the aortic valve.  Mediastinum/Lymph  Nodes: No pathologically enlarged mediastinal or  hilar lymph nodes. Esophagus is unremarkable in appearance. No  axillary lymphadenopathy.  Lungs/Pleura: Irregular-shaped nodule in the anterior aspect of the  lingula (axial image 44 of series 15 and coronal image 48 of series  16) measuring 2.4 x 2.3 x 1.4 cm, with macrolobulated and slightly  spiculated margins, concerning for potential neoplasm. No acute  consolidative airspace disease. No pleural effusions.  Musculoskeletal/Soft Tissues: There are no aggressive appearing  lytic or blastic lesions noted in the visualized portions of the  skeleton.  CTA ABDOMEN AND PELVIS FINDINGS  Hepatobiliary: No suspicious cystic or solid hepatic lesions. No  intra or extrahepatic biliary ductal dilatation. Gallbladder is  normal in appearance.  Pancreas: No pancreatic mass. No pancreatic ductal dilatation. No  pancreatic or peripancreatic fluid or inflammatory changes.  Spleen: Unremarkable.  Adrenals/Urinary Tract: Well-defined 1.9 cm left adrenal nodule,  incompletely characterized, but stable compared to remote prior  study from 05/08/2010 at which point this was previously  characterized as an adenoma. Subcentimeter low-attenuation lesion in  the interpolar region of the left kidney, too small to characterize,  but statistically likely to represent a tiny cyst. Right kidney and  bilateral adrenal glands are normal in appearance. No  hydroureteronephrosis. Urinary bladder is normal in appearance.  Stomach/Bowel: Normal appearance of the stomach. No pathologic  dilatation of small bowel or colon. Normal appendix.  Vascular/Lymphatic: Aortic atherosclerosis with postprocedural  changes of aorto bi-iliac bypass graft which is patent bilaterally.  Fusiform aneurysmal dilatation of the infrarenal abdominal aorta  with the aneurysm sac measuring up to 4.5 x 3.6 cm (axial image 131  of series 14), increased in size compared to prior study from    05/08/2010. Vascular findings and measurements pertinent to  potential TAVR procedure, as detailed below. There are no aggressive  appearing lytic or blastic lesions noted in the visualized portions  of the skeleton.  Reproductive: Brachytherapy implants throughout the prostate gland.  Seminal vesicles are unremarkable in appearance.  Other: No significant volume of ascites. No pneumoperitoneum.  Musculoskeletal: There are no aggressive appearing lytic or blastic  lesions noted in the visualized portions of the skeleton.  VASCULAR MEASUREMENTS PERTINENT TO TAVR:  AORTA:  Minimal Aortic Diameter-16 x 19 mm  Severity of Aortic Calcification-mild-to-moderate  RIGHT PELVIS:  Right Common Iliac Artery -  Minimal Diameter-10.9 x 8.8 mm  Tortuosity-mild  Calcification-stent  Right External Iliac Artery -  Minimal Diameter-8.2 x 6.3 mm  Tortuosity-severe  Calcification-mild  Right Common Femoral Artery -  Minimal Diameter-7.7 x 7.7 mm  Tortuosity-mild  Calcification-moderate  LEFT PELVIS:  Left Common Iliac Artery -  Minimal Diameter-9.7 x 8.2 mm  Tortuosity-mild  Calcification-stent  Left External Iliac Artery -  Minimal Diameter-7.2 x 6.6 mm  Tortuosity-severe  Calcification-moderate  Left Common Femoral Artery -  Minimal Diameter-8.8 x 7.8 mm  Tortuosity-mild  Calcification-moderate  Review of the MIP images confirms the above findings.  IMPRESSION:  1. Vascular findings and measurements pertinent to potential TAVR  procedure, as detailed above.  2. Severe thickening calcification of the aortic valve, compatible  with reported clinical history of severe aortic stenosis.  3. 2.4 x 2.3 x 1.4 cm aggressive appearing left upper lobe nodule,  highly concerning for primary bronchogenic neoplasm. Further  evaluation with PET-CT should be considered in the near future.  4. 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 Notably, this appears larger than remote prior study from  05/08/2010, which may suggest endoleak (no definitive signs of  endoleak are confidently identified on today's arterial phase  examination).  5. 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  6. Additional incidental findings, as above.  Electronically Signed  By: Vinnie Langton M.D.  On: 10/31/2018 14:59    Impression:   This 78 year old gentleman has stage D, severe, symptomatic aortic stenosis with New York Heart Association class II symptoms of exertional fatigue and shortness of breath consistent with chronic diastolic congestive heart failure. He has mild left ventricular systolic dysfunction with mild left ventricular dilatation. I have personally reviewed his 2D echocardiogram, cardiac catheterization and CTA studies. His echocardiogram shows a trileaflet aortic valve with severe calcification and thickening of the leaflets with reduced mobility. The mean gradient is 41 mmHg consistent with severe aortic stenosis. There is mild dilatation of the aortic root with a diameter of 43 mm. Cardiac catheterization shows mild nonobstructive coronary disease. I agree that aortic valve placement is indicated in this patient to improve his symptoms and quality of life as well as to prevent progressive left ventricular deterioration. I think transcatheter aortic valve replacement would be the best treatment option at his age with his comorbid risk factors. His gated cardiac CTA shows anatomy that is suitable for transcatheter aortic valve replacement. He is abdominal and pelvic CTA shows adequate pelvic vascular anatomy to allow transfemoral insertion. He does have an aortoiliac stent graft in place but the lumen and the iliac arteries appears large enough to get a transcatheter valve through them safely. Unfortunately the CT of his chest shows a 2.4 x 2.3 cm macrolobulated and slightly spiculated lung  mass in the anterior aspect of the lingula that is suspicious for lung cancer. He also has adenopathy in the mediastinum, left hilum, and aortopulmonary region.  The PET scan shows the left upper lobe nodule to be hypermetabolic consistent with bronchogenic carcinoma.  There are borderline enlarged lymph nodes in the anterior mediastinum and aortopulmonary window which have low-grade hypermetabolic activity which is similar to or below the blood pool activity and therefore not diagnostic of metastatic carcinoma.  Plan:   This 78 year old gentleman has severe, symptomatic aortic stenosis as well as a 2.5 cm left upper lobe lung mass that is hypermetabolic on PET scan and suspicious for bronchogenic carcinoma.  He has borderline enlarged mediastinal and AP window lymph nodes which are not definitely hypermetabolic on PET scan and therefore nondiagnostic.  They are larger than I typically see in this location and therefore could have microscopic metastasis or could just be inflammatory.  There are no evidence of distant metastases on PET scan.  Therefore I think would be best to proceed with transcatheter aortic valve replacement.  After a few week recovery I would proceed with bronchoscopy and mediastinoscopy to assess the mediastinal lymph nodes for microscopic metastasis and if negative consider resection of the left upper lobe lung mass by wedge resection or lobectomy depending on his pulmonary function test.  I reviewed the PET scan images with him and answered all of his questions.  I discussed the reasoning for this treatment plan and the possibility that he may still have lymph node metastasis although the lymph nodes were not clearly hypermetabolic on PET scan.  He understands and would like to proceed with TAVR followed by treatment  of his lung mass as outlined above.  Plan:  Transfemoral transcatheter aortic valve replacement.   Gaye Pollack, MD

## 2018-12-05 NOTE — Anesthesia Preprocedure Evaluation (Addendum)
Anesthesia Evaluation  Patient identified by MRN, date of birth, ID band Patient awake    Reviewed: Allergy & Precautions, NPO status , Patient's Chart, lab work & pertinent test results  Airway Mallampati: III  TM Distance: >3 FB     Dental  (+) Dental Advisory Given   Pulmonary asthma , COPD, Current Smoker,  Lung mass   breath sounds clear to auscultation       Cardiovascular hypertension, Pt. on medications and Pt. on home beta blockers + CAD and + Peripheral Vascular Disease  + dysrhythmias Atrial Fibrillation + Valvular Problems/Murmurs AS  Rhythm:Irregular Rate:Normal     Neuro/Psych negative neurological ROS     GI/Hepatic Neg liver ROS, hiatal hernia, GERD  Medicated,  Endo/Other  diabetes, Type 2, Oral Hypoglycemic Agents  Renal/GU negative Renal ROS     Musculoskeletal  (+) Arthritis ,   Abdominal   Peds  Hematology negative hematology ROS (+)   Anesthesia Other Findings   Reproductive/Obstetrics                           Lab Results  Component Value Date   WBC 8.6 12/01/2018   HGB 13.5 12/01/2018   HCT 43.3 12/01/2018   MCV 98.4 12/01/2018   PLT 188 12/01/2018   Lab Results  Component Value Date   CREATININE 0.85 12/01/2018   BUN 17 12/01/2018   NA 136 12/01/2018   K 4.3 12/01/2018   CL 106 12/01/2018   CO2 17 (L) 12/01/2018    Anesthesia Physical Anesthesia Plan  ASA: IV  Anesthesia Plan: MAC   Post-op Pain Management:    Induction:   PONV Risk Score and Plan: 0 and Propofol infusion, Ondansetron and Treatment may vary due to age or medical condition  Airway Management Planned: Simple Face Mask and Natural Airway  Additional Equipment: Arterial line  Intra-op Plan:   Post-operative Plan:   Informed Consent: I have reviewed the patients History and Physical, chart, labs and discussed the procedure including the risks, benefits and alternatives for  the proposed anesthesia with the patient or authorized representative who has indicated his/her understanding and acceptance.     Dental advisory given  Plan Discussed with: CRNA  Anesthesia Plan Comments:       Anesthesia Quick Evaluation

## 2018-12-06 ENCOUNTER — Inpatient Hospital Stay (HOSPITAL_COMMUNITY): Payer: Medicare Other

## 2018-12-06 ENCOUNTER — Inpatient Hospital Stay (HOSPITAL_COMMUNITY): Payer: Medicare Other | Admitting: Certified Registered Nurse Anesthetist

## 2018-12-06 ENCOUNTER — Inpatient Hospital Stay (HOSPITAL_COMMUNITY)
Admission: RE | Admit: 2018-12-06 | Discharge: 2018-12-07 | DRG: 267 | Disposition: A | Discharge status: Home / Self Care | Payer: Medicare Other | Attending: Surgery | Admitting: Surgery

## 2018-12-06 ENCOUNTER — Other Ambulatory Visit: Payer: Self-pay

## 2018-12-06 ENCOUNTER — Encounter (HOSPITAL_COMMUNITY)
Admission: RE | Disposition: A | Discharge status: Home / Self Care | Payer: Self-pay | Source: Home / Self Care | Attending: Surgery

## 2018-12-06 ENCOUNTER — Encounter (HOSPITAL_COMMUNITY): Payer: Self-pay

## 2018-12-06 DIAGNOSIS — Z800 Family history of malignant neoplasm of digestive organs: Secondary | ICD-10-CM | POA: Diagnosis not present

## 2018-12-06 DIAGNOSIS — E119 Type 2 diabetes mellitus without complications: Secondary | ICD-10-CM

## 2018-12-06 DIAGNOSIS — Z833 Family history of diabetes mellitus: Secondary | ICD-10-CM

## 2018-12-06 DIAGNOSIS — R911 Solitary pulmonary nodule: Secondary | ICD-10-CM | POA: Diagnosis present

## 2018-12-06 DIAGNOSIS — I350 Nonrheumatic aortic (valve) stenosis: Secondary | ICD-10-CM

## 2018-12-06 DIAGNOSIS — M1990 Unspecified osteoarthritis, unspecified site: Secondary | ICD-10-CM | POA: Diagnosis not present

## 2018-12-06 DIAGNOSIS — Z7984 Long term (current) use of oral hypoglycemic drugs: Secondary | ICD-10-CM | POA: Diagnosis not present

## 2018-12-06 DIAGNOSIS — I2510 Atherosclerotic heart disease of native coronary artery without angina pectoris: Secondary | ICD-10-CM | POA: Diagnosis present

## 2018-12-06 DIAGNOSIS — Z8546 Personal history of malignant neoplasm of prostate: Secondary | ICD-10-CM

## 2018-12-06 DIAGNOSIS — Z7902 Long term (current) use of antithrombotics/antiplatelets: Secondary | ICD-10-CM

## 2018-12-06 DIAGNOSIS — I739 Peripheral vascular disease, unspecified: Secondary | ICD-10-CM | POA: Diagnosis present

## 2018-12-06 DIAGNOSIS — K219 Gastro-esophageal reflux disease without esophagitis: Secondary | ICD-10-CM | POA: Diagnosis present

## 2018-12-06 DIAGNOSIS — J449 Chronic obstructive pulmonary disease, unspecified: Secondary | ICD-10-CM | POA: Diagnosis present

## 2018-12-06 DIAGNOSIS — Z006 Encounter for examination for normal comparison and control in clinical research program: Secondary | ICD-10-CM

## 2018-12-06 DIAGNOSIS — I714 Abdominal aortic aneurysm, without rupture, unspecified: Secondary | ICD-10-CM | POA: Diagnosis present

## 2018-12-06 DIAGNOSIS — I10 Essential (primary) hypertension: Secondary | ICD-10-CM | POA: Diagnosis present

## 2018-12-06 DIAGNOSIS — Z7901 Long term (current) use of anticoagulants: Secondary | ICD-10-CM

## 2018-12-06 DIAGNOSIS — E785 Hyperlipidemia, unspecified: Secondary | ICD-10-CM | POA: Diagnosis present

## 2018-12-06 DIAGNOSIS — Z79899 Other long term (current) drug therapy: Secondary | ICD-10-CM

## 2018-12-06 DIAGNOSIS — F17290 Nicotine dependence, other tobacco product, uncomplicated: Secondary | ICD-10-CM | POA: Diagnosis present

## 2018-12-06 DIAGNOSIS — E1151 Type 2 diabetes mellitus with diabetic peripheral angiopathy without gangrene: Secondary | ICD-10-CM | POA: Diagnosis present

## 2018-12-06 DIAGNOSIS — Z8042 Family history of malignant neoplasm of prostate: Secondary | ICD-10-CM | POA: Diagnosis not present

## 2018-12-06 DIAGNOSIS — J45909 Unspecified asthma, uncomplicated: Secondary | ICD-10-CM | POA: Diagnosis not present

## 2018-12-06 DIAGNOSIS — Z952 Presence of prosthetic heart valve: Secondary | ICD-10-CM | POA: Diagnosis not present

## 2018-12-06 DIAGNOSIS — Z8249 Family history of ischemic heart disease and other diseases of the circulatory system: Secondary | ICD-10-CM

## 2018-12-06 DIAGNOSIS — Z954 Presence of other heart-valve replacement: Secondary | ICD-10-CM | POA: Diagnosis not present

## 2018-12-06 DIAGNOSIS — R918 Other nonspecific abnormal finding of lung field: Secondary | ICD-10-CM | POA: Diagnosis present

## 2018-12-06 DIAGNOSIS — I4819 Other persistent atrial fibrillation: Secondary | ICD-10-CM | POA: Diagnosis present

## 2018-12-06 HISTORY — DX: Solitary pulmonary nodule: R91.1

## 2018-12-06 HISTORY — PX: TRANSCATHETER AORTIC VALVE REPLACEMENT, TRANSFEMORAL: SHX6400

## 2018-12-06 HISTORY — PX: TEE WITHOUT CARDIOVERSION: SHX5443

## 2018-12-06 LAB — POCT I-STAT 4, (NA,K, GLUC, HGB,HCT)
Glucose, Bld: 146 mg/dL — ABNORMAL HIGH (ref 70–99)
HCT: 37 % — ABNORMAL LOW (ref 39.0–52.0)
Hemoglobin: 12.6 g/dL — ABNORMAL LOW (ref 13.0–17.0)
Potassium: 3.7 mmol/L (ref 3.5–5.1)
Sodium: 141 mmol/L (ref 135–145)

## 2018-12-06 LAB — GLUCOSE, CAPILLARY
Glucose-Capillary: 162 mg/dL — ABNORMAL HIGH (ref 70–99)
Glucose-Capillary: 114 mg/dL — ABNORMAL HIGH (ref 70–99)
Glucose-Capillary: 94 mg/dL (ref 70–99)
Glucose-Capillary: 123 mg/dL — ABNORMAL HIGH (ref 70–99)

## 2018-12-06 LAB — PROTIME-INR
INR: 1.1 (ref 0.8–1.2)
Prothrombin Time: 14.4 seconds (ref 11.4–15.2)

## 2018-12-06 LAB — POCT ACTIVATED CLOTTING TIME: Activated Clotting Time: 261 seconds

## 2018-12-06 IMAGING — DX PORTABLE CHEST - 1 VIEW
1 series · 1 of 1 positions shown · non-contrast
Comparison: [DATE], CT [DATE], [DATE]

CLINICAL DATA: 77-year-old male with a history of TAVR

EXAM:
PORTABLE CHEST 1 VIEW

[chest]
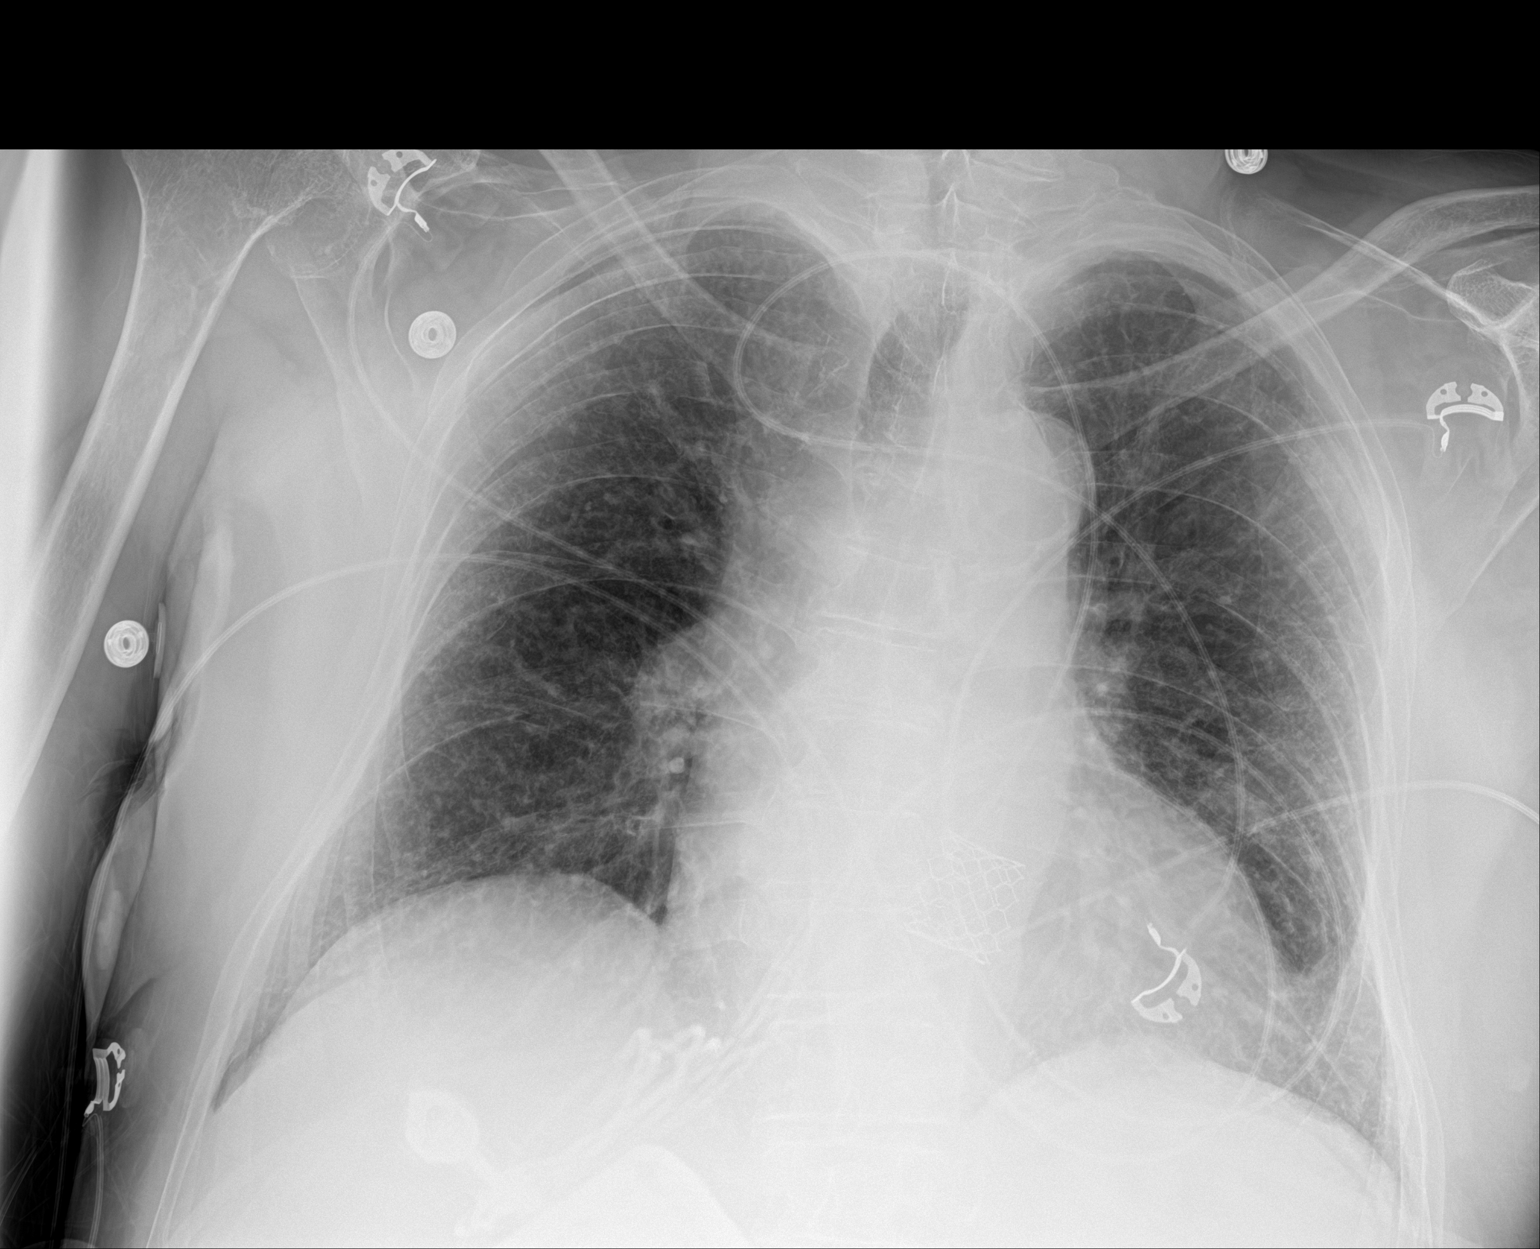

[1 of 1 positions shown; findings below may reference images not displayed]

FINDINGS: Cardiomediastinal silhouette unchanged in size and contour. Interval
changes of trans arterial aortic valve repair.

No pneumothorax or pleural effusion. Coarsened interstitial markings
throughout, similar to prior. No new confluent airspace disease.

Known lingular lesion not well visualized.

No displaced fracture.
IMPRESSION: Interval changes of trans arterial aortic valve repair.

Chronic lung changes, with poor visualization of the known lingular
lung lesion.

## 2018-12-06 SURGERY — IMPLANTATION, AORTIC VALVE, TRANSCATHETER, FEMORAL APPROACH
Anesthesia: Monitor Anesthesia Care | Site: Chest

## 2018-12-06 MED ORDER — HEPARIN (PORCINE) IN NACL 1000-0.9 UT/500ML-% IV SOLN
INTRAVENOUS | Status: AC
  Filled 2018-12-06: qty 500

## 2018-12-06 MED ORDER — ONDANSETRON HCL 4 MG/2ML IJ SOLN: 4.0000 mg | Freq: Four times a day (QID) | INTRAMUSCULAR | Status: DC | PRN

## 2018-12-06 MED ORDER — HEPARIN (PORCINE) IN NACL 1000-0.9 UT/500ML-% IV SOLN
INTRAVENOUS | Status: DC | PRN
  Administered 2018-12-06 (×6): 500 mL

## 2018-12-06 MED ORDER — CHLORHEXIDINE GLUCONATE 0.12 % MT SOLN
15.0000 mL | Freq: Once | OROMUCOSAL | Status: AC
  Administered 2018-12-06: 08:00:00 15 mL via OROMUCOSAL
  Filled 2018-12-06: qty 15

## 2018-12-06 MED ORDER — ASPIRIN 81 MG PO CHEW
81.0000 mg | CHEWABLE_TABLET | Freq: Every day | ORAL | Status: DC
  Administered 2018-12-07: 81 mg via ORAL
  Filled 2018-12-06: qty 1

## 2018-12-06 MED ORDER — DEXMEDETOMIDINE HCL IN NACL 400 MCG/100ML IV SOLN
INTRAVENOUS | Status: DC | PRN
  Administered 2018-12-06: 40 ug via INTRAVENOUS

## 2018-12-06 MED ORDER — ACETAMINOPHEN 650 MG RE SUPP: 650.0000 mg | Freq: Four times a day (QID) | RECTAL | Status: DC | PRN

## 2018-12-06 MED ORDER — SODIUM CHLORIDE 0.9% FLUSH: 3.0000 mL | INTRAVENOUS | Status: DC | PRN

## 2018-12-06 MED ORDER — SODIUM CHLORIDE 0.9 % IV SOLN: INTRAVENOUS | Status: DC

## 2018-12-06 MED ORDER — PHENYLEPHRINE HCL-NACL 20-0.9 MG/250ML-% IV SOLN
0.0000 ug/min | INTRAVENOUS | Status: DC
  Filled 2018-12-06: qty 250

## 2018-12-06 MED ORDER — CHLORHEXIDINE GLUCONATE 4 % EX LIQD: 30.0000 mL | CUTANEOUS | Status: DC

## 2018-12-06 MED ORDER — ONDANSETRON HCL 4 MG/2ML IJ SOLN
INTRAMUSCULAR | Status: DC | PRN
  Administered 2018-12-06: 4 mg via INTRAVENOUS

## 2018-12-06 MED ORDER — PHENYLEPHRINE HCL (PRESSORS) 10 MG/ML IV SOLN
INTRAVENOUS | Status: DC | PRN
  Administered 2018-12-06: 200 ug via INTRAVENOUS

## 2018-12-06 MED ORDER — SIMVASTATIN 20 MG PO TABS
40.0000 mg | ORAL_TABLET | Freq: Every day | ORAL | Status: DC
  Administered 2018-12-06: 40 mg via ORAL
  Filled 2018-12-06: qty 2

## 2018-12-06 MED ORDER — SODIUM CHLORIDE 0.9 % IV SOLN
1.5000 g | Freq: Two times a day (BID) | INTRAVENOUS | Status: DC
  Administered 2018-12-06 – 2018-12-07 (×3): 1.5 g via INTRAVENOUS
  Filled 2018-12-06 (×4): qty 1.5

## 2018-12-06 MED ORDER — PROPOFOL 500 MG/50ML IV EMUL
INTRAVENOUS | Status: DC | PRN
  Administered 2018-12-06: 10 ug/kg/min via INTRAVENOUS

## 2018-12-06 MED ORDER — SODIUM CHLORIDE 0.9 % IV SOLN: 250.0000 mL | INTRAVENOUS | Status: DC | PRN

## 2018-12-06 MED ORDER — CHLORHEXIDINE GLUCONATE 4 % EX LIQD: 60.0000 mL | Freq: Once | CUTANEOUS | Status: DC

## 2018-12-06 MED ORDER — HEPARIN SODIUM (PORCINE) 1000 UNIT/ML IJ SOLN
INTRAMUSCULAR | Status: DC | PRN
  Administered 2018-12-06: 12000 [IU] via INTRAVENOUS

## 2018-12-06 MED ORDER — LIDOCAINE HCL 1 % IJ SOLN
INTRAMUSCULAR | Status: AC
  Filled 2018-12-06: qty 20

## 2018-12-06 MED ORDER — LACTATED RINGERS IV SOLN
INTRAVENOUS | Status: DC
  Administered 2018-12-06: 08:00:00 via INTRAVENOUS

## 2018-12-06 MED ORDER — IOHEXOL 350 MG/ML SOLN
INTRAVENOUS | Status: DC | PRN
  Administered 2018-12-06: 11:00:00 80 mL via INTRA_ARTERIAL

## 2018-12-06 MED ORDER — SODIUM CHLORIDE 0.9 % IV BOLUS
500.0000 mL | Freq: Once | INTRAVENOUS | Status: AC
  Administered 2018-12-06: 500 mL via INTRAVENOUS

## 2018-12-06 MED ORDER — PROTAMINE SULFATE 10 MG/ML IV SOLN
INTRAVENOUS | Status: DC | PRN
  Administered 2018-12-06: 10 mg via INTRAVENOUS
  Administered 2018-12-06: 110 mg via INTRAVENOUS

## 2018-12-06 MED ORDER — LIDOCAINE HCL (PF) 1 % IJ SOLN
INTRAMUSCULAR | Status: DC | PRN
  Administered 2018-12-06: 10 mL via INTRADERMAL
  Administered 2018-12-06: 15 mL via INTRADERMAL

## 2018-12-06 MED ORDER — MORPHINE SULFATE (PF) 2 MG/ML IV SOLN: 1.0000 mg | INTRAVENOUS | Status: DC | PRN

## 2018-12-06 MED ORDER — SODIUM CHLORIDE 0.9 % IV SOLN
INTRAVENOUS | Status: AC
  Administered 2018-12-06 (×2): via INTRAVENOUS

## 2018-12-06 MED ORDER — LACTATED RINGERS IV SOLN
INTRAVENOUS | Status: DC | PRN
  Administered 2018-12-06: 09:00:00 via INTRAVENOUS

## 2018-12-06 MED ORDER — VANCOMYCIN HCL IN DEXTROSE 1-5 GM/200ML-% IV SOLN
1000.0000 mg | Freq: Once | INTRAVENOUS | Status: AC
  Administered 2018-12-06: 1000 mg via INTRAVENOUS
  Filled 2018-12-06: qty 200

## 2018-12-06 MED ORDER — NITROGLYCERIN IN D5W 200-5 MCG/ML-% IV SOLN: 0.0000 ug/min | INTRAVENOUS | Status: DC

## 2018-12-06 MED ORDER — PANTOPRAZOLE SODIUM 40 MG PO TBEC
40.0000 mg | DELAYED_RELEASE_TABLET | Freq: Every day | ORAL | Status: DC
  Administered 2018-12-06 – 2018-12-07 (×2): 40 mg via ORAL
  Filled 2018-12-06 (×2): qty 1

## 2018-12-06 MED ORDER — SODIUM CHLORIDE 0.9% FLUSH
3.0000 mL | Freq: Two times a day (BID) | INTRAVENOUS | Status: DC
  Administered 2018-12-06 – 2018-12-07 (×2): 3 mL via INTRAVENOUS

## 2018-12-06 MED ORDER — PROPOFOL 10 MG/ML IV BOLUS
INTRAVENOUS | Status: DC | PRN
  Administered 2018-12-06: 10 mg via INTRAVENOUS

## 2018-12-06 MED ORDER — MUPIROCIN 2 % EX OINT
1.0000 "application " | TOPICAL_OINTMENT | Freq: Two times a day (BID) | CUTANEOUS | Status: DC
  Administered 2018-12-06 – 2018-12-07 (×3): 1 via NASAL
  Filled 2018-12-06: qty 22

## 2018-12-06 MED ORDER — DORZOLAMIDE HCL 2 % OP SOLN
1.0000 [drp] | Freq: Three times a day (TID) | OPHTHALMIC | Status: DC
  Administered 2018-12-06 – 2018-12-07 (×3): 1 [drp] via OPHTHALMIC
  Filled 2018-12-06: qty 10

## 2018-12-06 MED ORDER — OXYCODONE HCL 5 MG PO TABS: 5.0000 mg | ORAL_TABLET | ORAL | Status: DC | PRN

## 2018-12-06 MED ORDER — TRAMADOL HCL 50 MG PO TABS: 50.0000 mg | ORAL_TABLET | ORAL | Status: DC | PRN

## 2018-12-06 MED ORDER — INSULIN ASPART 100 UNIT/ML ~~LOC~~ SOLN
0.0000 [IU] | Freq: Three times a day (TID) | SUBCUTANEOUS | Status: DC
  Administered 2018-12-07 (×2): 2 [IU] via SUBCUTANEOUS

## 2018-12-06 MED ORDER — CHLORHEXIDINE GLUCONATE CLOTH 2 % EX PADS
6.0000 | MEDICATED_PAD | Freq: Every day | CUTANEOUS | Status: DC
  Administered 2018-12-06 – 2018-12-07 (×2): 6 via TOPICAL

## 2018-12-06 MED ORDER — ACETAMINOPHEN 325 MG PO TABS
650.0000 mg | ORAL_TABLET | Freq: Four times a day (QID) | ORAL | Status: DC | PRN
  Administered 2018-12-06 – 2018-12-07 (×3): 650 mg via ORAL
  Filled 2018-12-06 (×3): qty 2

## 2018-12-06 SURGICAL SUPPLY — 40 items
BAG SNAP BAND KOVER 36X36 (MISCELLANEOUS) ×8 IMPLANT
BLANKET WARM UNDERBOD FULL ACC (MISCELLANEOUS) ×4 IMPLANT
CABLE ADAPT CONN TEMP 6FT (ADAPTER) ×2 IMPLANT
CATH 26 EDWARDS DELIVERY SYS (CATHETERS) ×2 IMPLANT
CATH DIAG 6FR PIGTAIL ANGLED (CATHETERS) ×4 IMPLANT
CATH INFINITI 6F AL2 (CATHETERS) ×2 IMPLANT
CATH INFINITI JR4 5F (CATHETERS) ×2 IMPLANT
CATH S G BIP PACING (CATHETERS) ×2 IMPLANT
CLOSURE MYNX CONTROL 6F/7F (Vascular Products) ×2 IMPLANT
COVER DOME SNAP 22 D (MISCELLANEOUS) ×2 IMPLANT
CRIMPER (MISCELLANEOUS) ×2 IMPLANT
DEVICE CLOSURE PERCLS PRGLD 6F (VASCULAR PRODUCTS) IMPLANT
DEVICE INFLATION ATRION QL2530 (MISCELLANEOUS) ×2 IMPLANT
ELECT DEFIB PAD ADLT CADENCE (PAD) ×2 IMPLANT
GUIDEWIRE SAFE TJ AMPLATZ EXST (WIRE) ×2 IMPLANT
KIT HEART LEFT (KITS) ×4 IMPLANT
KIT MICROPUNCTURE NIT STIFF (SHEATH) ×2 IMPLANT
PACK CARDIAC CATHETERIZATION (CUSTOM PROCEDURE TRAY) ×4 IMPLANT
PERCLOSE PROGLIDE 6F (VASCULAR PRODUCTS) ×8
PINNACLE LONG 6F 25CM (SHEATH) ×4
SHEATH 14X36 EDWARDS (SHEATH) ×2 IMPLANT
SHEATH BRITE TIP 7FR 35CM (SHEATH) ×2 IMPLANT
SHEATH INTRO PINNACLE 6F 25CM (SHEATH) IMPLANT
SHEATH PINNACLE 6F 10CM (SHEATH) ×2 IMPLANT
SHEATH PINNACLE 8F 10CM (SHEATH) ×2 IMPLANT
SHEATH PROBE COVER 6X72 (BAG) ×2 IMPLANT
SLEEVE REPOSITIONING LENGTH 30 (MISCELLANEOUS) ×2 IMPLANT
STOPCOCK MORSE 400PSI 3WAY (MISCELLANEOUS) ×6 IMPLANT
SYR MEDRAD MARK 7 150ML (SYRINGE) ×2 IMPLANT
SYR MEDRAD MARK V 150ML (SYRINGE) IMPLANT
TUBE CONN 8.8X1320 FR HP M-F (CONNECTOR) ×2 IMPLANT
TUBING ART PRESS 72  MALE/FEM (TUBING) ×4
TUBING ART PRESS 72 MALE/FEM (TUBING) IMPLANT
TUBING CIL FLEX 10 FLL-RA (TUBING) ×2 IMPLANT
VALVE HEART TRANSCATH SZ3 26MM (Valve) ×2 IMPLANT
WIRE AMPLATZ SS-J .035X180CM (WIRE) ×2 IMPLANT
WIRE EMERALD 3MM-J .035X150CM (WIRE) ×2 IMPLANT
WIRE EMERALD 3MM-J .035X260CM (WIRE) ×2 IMPLANT
WIRE EMERALD ST .035X260CM (WIRE) ×2 IMPLANT
WIRE HI TORQ VERSACORE-J 145CM (WIRE) ×2 IMPLANT

## 2018-12-06 NOTE — Op Note (Signed)
[facility]   MULTIDISCIPLINARY HEART VALVE TEAM   TAVR OPERATIVE NOTE   Date of Procedure:  12/06/2018  Preoperative Diagnosis: Severe Aortic Stenosis   Postoperative Diagnosis: Same   Procedure:    Transcatheter Aortic Valve Replacement - Percutaneous Right Transfemoral Approach  Edwards Sapien 3 THV (size 26 mm, model # 9600TFX, serial # [id number])   Co-Surgeons:  Gaye Pollack, MD and Lauree Chandler, MD   Anesthesiologist:  R. Ola Spurr, MD  Echocardiographer:  Ena Dawley, MD  Pre-operative Echo Findings:  Severe aortic stenosis  Mild left ventricular systolic dysfunction  Post-operative Echo Findings:  mild paravalvular leak  Mild unchanged left ventricular systolic dysfunction   BRIEF CLINICAL NOTE AND INDICATIONS FOR SURGERY   This 78 year old gentleman has stage D, severe, symptomatic aortic stenosis with New York Heart Association class II symptoms of exertional fatigue and shortness of breath consistent with chronic diastolic congestive heart failure. He has mild left ventricular systolic dysfunction with mild left ventricular dilatation. I have personally reviewed his 2D echocardiogram, cardiac catheterization and CTA studies. His echocardiogram shows a trileaflet aortic valve with severe calcification and thickening of the leaflets with reduced mobility. The mean gradient is 41 mmHg consistent with severe aortic stenosis. There is mild dilatation of the aortic root with a diameter of 43 mm. Cardiac catheterization shows mild nonobstructive coronary disease. I agree that aortic valve placement is indicated in this patient to improve his symptoms and quality of life as well as to prevent progressive left ventricular deterioration. I think transcatheter aortic valve replacement would be the best treatment option at his age with his comorbid risk factors. His gated cardiac CTA shows anatomy that is suitable for transcatheter aortic valve  replacement. He is abdominal and pelvic CTA shows adequate pelvic vascular anatomy to allow transfemoral insertion. He does have an aortoiliac stent graft in place but the lumen and the iliac arteries appears large enough to get a transcatheter valve through them safely. Unfortunately the CT of his chest shows a 2.4 x 2.3 cm macrolobulated and slightly spiculated lung mass in the anterior aspect of the lingula that is suspicious for lung cancer. He also has adenopathy in the mediastinum, left hilum, and aortopulmonary region.  The PET scan shows the left upper lobe nodule to be hypermetabolic consistent with bronchogenic carcinoma. There are borderline enlarged lymph nodes in the anterior mediastinum and aortopulmonary window which have low-grade hypermetabolic activity which is similar to or below the blood pool activity and therefore not diagnostic of metastatic carcinoma.   During the course of the patient's preoperative work up they have been evaluated comprehensively by a multidisciplinary team of specialists coordinated through the [facility] in the [facility].  They have been demonstrated to suffer from symptomatic severe aortic stenosis as noted above. The patient has been counseled extensively as to the relative risks and benefits of all options for the treatment of severe aortic stenosis including long term medical therapy, conventional surgery for aortic valve replacement, and transcatheter aortic valve replacement.  All questions have been answered, and the patient provides full informed consent for the operation as described.   DETAILS OF THE OPERATIVE PROCEDURE  PREPARATION:    The patient is brought to the operating room on the above mentioned date and appropriate monitoring was established by the anesthesia team. The patient is placed in the supine position on the operating table.  Intravenous antibiotics are administered. The  patient is monitored closely throughout  the procedure under conscious sedation.    Baseline transesophageal transthoracic echocardiogram was performed. The patient's chest, abdomen, both groins, and both lower extremities are prepared and draped in a sterile manner. A time out procedure is performed.   PERIPHERAL ACCESS:    Using the modified Seldinger technique, femoral arterial and venous access was obtained with placement of 6 Fr sheaths on the left side.  A pigtail diagnostic catheter was passed through the left arterial sheath under fluoroscopic guidance into the aortic root.  A temporary transvenous pacemaker catheter was passed through the left femoral venous sheath under fluoroscopic guidance into the right ventricle.  The pacemaker was tested to ensure stable lead placement and pacemaker capture. Aortic root angiography was performed in order to determine the optimal angiographic angle for valve deployment.   TRANSFEMORAL ACCESS:   Percutaneous transfemoral access and sheath placement was performed using ultrasound guidance.  The right common femoral artery was cannulated using a micropuncture needle and appropriate location was verified using hand injection angiogram.  A pair of Abbott Perclose percutaneous closure devices were placed and a 6 French sheath replaced into the femoral artery.  The patient was heparinized systemically and ACT verified > 250 seconds.    A 14 Fr transfemoral E-sheath was introduced into the right common femoral artery after progressively dilating over an Amplatz superstiff wire. An AL-2 catheter was used to direct a straight-tip exchange length wire across the native aortic valve into the left ventricle. This was exchanged out for a pigtail catheter and position was confirmed in the LV apex. Simultaneous LV and Ao pressures were recorded.  The pigtail catheter was exchanged for an Amplatz Extra-stiff wire in the LV apex.    BALLOON AORTIC VALVULOPLASTY:   Not  performed   TRANSCATHETER HEART VALVE DEPLOYMENT:   An Edwards Sapien 3 transcatheter heart valve (size 26 mm, model #9600TFX, serial #[id number]) was prepared and crimped per manufacturer's guidelines, and the proper orientation of the valve is confirmed on the [employer] delivery system. The valve was advanced through the introducer sheath using normal technique until in an appropriate position in the abdominal aorta beyond the sheath tip. The balloon was then retracted and using the fine-tuning wheel was centered on the valve. The valve was then advanced across the aortic arch using appropriate flexion of the catheter. The valve was carefully positioned across the aortic valve annulus. The Commander catheter was retracted using normal technique. Once final position of the valve has been confirmed by angiographic assessment, the valve is deployed while temporarily holding ventilation and during rapid ventricular pacing to maintain systolic blood pressure < 50 mmHg and pulse pressure < 10 mmHg. The balloon inflation is held for >3 seconds after reaching full deployment volume. Once the balloon has fully deflated the balloon is retracted into the ascending aorta and valve function is assessed using echocardiography. There is felt to be mild but improving paravalvular leak and no central aortic insufficiency.  The patient's hemodynamic recovery following valve deployment is good.  The deployment balloon and guidewire are both removed.    PROCEDURE COMPLETION:   The sheath was removed and femoral artery closure performed.  Protamine was administered once femoral arterial repair was complete. The temporary pacemaker, pigtail catheters and femoral sheaths were removed with manual pressure used for hemostasis.  A Mynx femoral closure device was utilized following removal of the diagnostic sheath in the left femoral artery.  The patient tolerated the procedure well and is transported to the surgical  intensive  care in stable condition. There were no immediate intraoperative complications. All sponge instrument and needle counts are verified correct at completion of the operation.   No blood products were administered during the operation.  The patient received a total of 80 mL of intravenous contrast during the procedure.   Gaye Pollack, MD 12/06/2018 11:10 AM

## 2018-12-06 NOTE — Progress Notes (Signed)
  [place] VALVE TEAM  Patient doing well s/p TAVR. She is hemodynamically stable after 750 cc fluid bolus. BP now stable. Groin sites stable. ECG with aifb with SVR but no high grade block. Tele shows slow afib with HRs into high 30s and 40s. Currently low 50s. Longest pause 2.9 seconds. Will continue to monitor closely. Arterial line discontinued and transferred  to 4E. Plan for early ambulation after bedrest completed and hopeful discharge over the next 24-48 hours.   Angelena Form PA-C  [facility]  Pager [phone]

## 2018-12-06 NOTE — Progress Notes (Signed)
Echocardiogram 2D Echocardiogram has been performed.  Matilde Bash 12/06/2018, 11:21 AM

## 2018-12-06 NOTE — Progress Notes (Signed)
Nell Range, PA paged and updated regarding patient BPs. Per Joellen Jersey, [place] will administer 562ml NS IV bolus. Patient arousable to voice at this time with no complaints. Will continue to monitor.

## 2018-12-06 NOTE — Interval H&P Note (Signed)
History and Physical Interval Note:  12/06/2018 8:05 AM  Vincent Velez  has presented today for surgery, with the diagnosis of Severe Aortic Stenosis.  The various methods of treatment have been discussed with the patient and family. After consideration of risks, benefits and other options for treatment, the patient has consented to  Procedure(s): TRANSCATHETER AORTIC VALVE REPLACEMENT, TRANSFEMORAL (N/A) TRANSESOPHAGEAL ECHOCARDIOGRAM (TEE) (N/A) as a surgical intervention.  The patient's history has been reviewed, patient examined, no change in status, stable for surgery.  I have reviewed the patient's chart and labs.  Questions were answered to the patient's satisfaction.     Fernande Boyden Bartle   Shortness of breath: Yes.   If yes: with what activity?: any activity Worse than previously noted?: Yes.    New edema, PND, orthopnea: No.  Recent decrease in activity i.e. more difficulty walking to mailbox, climbing stairs, etc: Yes.    Changes in sleeping i.e. need to utilize to sleep on more pillows, sitting up, etc: No.  Changes since last seen in pre-op visit: Yes.  feels weaker and having more dizziness

## 2018-12-06 NOTE — Transfer of Care (Signed)
Immediate Anesthesia Transfer of Care Note  Patient: Vincent Velez  Procedure(s) Performed: TRANSCATHETER AORTIC VALVE REPLACEMENT, TRANSFEMORAL (N/A Chest) TRANSESOPHAGEAL ECHOCARDIOGRAM (TEE) (N/A )  Patient Location: Cath Lab  Anesthesia Type:MAC  Level of Consciousness: awake, alert  and oriented  Airway & Oxygen Therapy: Patient Spontanous Breathing and Patient connected to nasal cannula oxygen  Post-op Assessment: Report given to RN, Post -op Vital signs reviewed and stable and Patient moving all extremities X 4  Post vital signs: Reviewed and stable  Last Vitals:  Vitals Value Taken Time  BP 102/66 12/06/18 1135  Temp 36.6 C 12/06/18 1134  Pulse 68 12/06/18 1135  Resp 16 12/06/18 1135  SpO2 99 % 12/06/18 1135  Vitals shown include unvalidated device data.  Last Pain:  Vitals:   12/06/18 1134  TempSrc: Temporal  PainSc: Asleep      Patients Stated Pain Goal: 2 (78/00/44 [id number])  Complications: No apparent anesthesia complications

## 2018-12-06 NOTE — Progress Notes (Signed)
@  2115 Pt ambulated in hall 350 ft. Did have transient bout of Afib RVR (HR 130s-140s) for approx. 20 seconds before rate returned to below 100. Pt asymptomatic during event. Will convey to Day Team and continue to monitor.

## 2018-12-06 NOTE — CV Procedure (Signed)
[facility]  TAVR OPERATIVE NOTE   Date of Procedure:  12/06/2018  Preoperative Diagnosis: Severe Aortic Stenosis   Postoperative Diagnosis: Same   Procedure:    Transcatheter Aortic Valve Replacement - Transfemoral Approach  Edwards Sapien 3 THV (size 26 mm, model # [id number], serial # [id number])   Co-Surgeons:  Lauree Chandler, MD and Gaye Pollack, MD  Anesthesiologist:  Ola Spurr  Echocardiographer:  Meda Coffee  Pre-operative Echo Findings:  Severe aortic stenosis  Normal left ventricular systolic function  Post-operative Echo Findings:  Trivial paravalvular leak  Normal left ventricular systolic function  BRIEF CLINICAL NOTE AND INDICATIONS FOR SURGERY  78 yo male with history of AAA, arthritis, persistent atrial fibrillation/flutter, CAD, COPD, DM, GERD, HTN, hyperlipidemia, prostate cancer and severe aortic stenosis who is here today for TAVR. His aortic stenosis has been moderate and has been followed for several years by Dr. Marlou Porch. Echo March 2020 with mild LV systolic dysfunction, TMLY=65-03%. The aortic valve leaflets are thickened and calcified with mean gradient of 41 mmg, peak gradient 61 mmHg, AVA 0.60 cm2, DVI 0.19. This is consistent with severe aortic stenosis. He has undergone EVAR for AAA in 2010. He has had persistent atrial fibrillation and flutter since 2017. He has been on Xarelto. He quit smoking cigars in 2016 but smokes a pipe daily now. Mild CAD by cath.   During the course of the patient's preoperative work up they have been evaluated comprehensively by a multidisciplinary team of specialists coordinated through the [facility] in the [facility].  They have been demonstrated to suffer from symptomatic severe aortic stenosis as noted above. The patient has been counseled extensively as to the relative risks and benefits of all options for the treatment of severe aortic stenosis  including long term medical therapy, conventional surgery for aortic valve replacement, and transcatheter aortic valve replacement.  The patient has been independently evaluated by Dr. Cyndia Bent with CT surgery and they are felt to be at high risk for conventional surgical aortic valve replacement. The surgeon indicated the patient would be a poor candidate for conventional surgery. Based upon review of all of the patient's preoperative diagnostic tests they are felt to be candidate for transcatheter aortic valve replacement using the transfemoral approach as an alternative to high risk conventional surgery.    Following the decision to proceed with transcatheter aortic valve replacement, a discussion has been held regarding what types of management strategies would be attempted intraoperatively in the event of life-threatening complications, including whether or not the patient would be considered a candidate for the use of cardiopulmonary bypass and/or conversion to open sternotomy for attempted surgical intervention.  The patient has been advised of a variety of complications that might develop peculiar to this approach including but not limited to risks of death, stroke, paravalvular leak, aortic dissection or other major vascular complications, aortic annulus rupture, device embolization, cardiac rupture or perforation, acute myocardial infarction, arrhythmia, heart block or bradycardia requiring permanent pacemaker placement, congestive heart failure, respiratory failure, renal failure, pneumonia, infection, other late complications related to structural valve deterioration or migration, or other complications that might ultimately cause a temporary or permanent loss of functional independence or other long term morbidity.  The patient provides full informed consent for the procedure as described and all questions were answered preoperatively.    DETAILS OF THE OPERATIVE PROCEDURE  PREPARATION:   The  patient is brought to the operating room on the above mentioned date  and central monitoring was established by the anesthesia team including placement of a radial arterial line. The patient is placed in the supine position on the operating table.  Intravenous antibiotics are administered. Conscious sedation is used.   Baseline transthoracic echocardiogram was performed. The patient's chest, abdomen, both groins, and both lower extremities are prepared and draped in a sterile manner. A time out procedure is performed.   PERIPHERAL ACCESS:   Using the modified Seldinger technique, femoral arterial and venous access were obtained with placement of 6 Fr sheaths on the left side using u/s guidance.  A pigtail diagnostic catheter was passed through the femoral arterial sheath under fluoroscopic guidance into the aortic root.  A temporary transvenous pacemaker catheter was passed through the femoral venous sheath under fluoroscopic guidance into the right ventricle.  The pacemaker was tested to ensure stable lead placement and pacemaker capture. Aortic root angiography was performed in order to determine the optimal angiographic angle for valve deployment.  TRANSFEMORAL ACCESS:  A micropuncture kit was used to gain access to the right femoral artery using u/s guidance. Position confirmed with angiography. Pre-closure with double ProGlide closure devices. The patient was heparinized systemically and ACT verified > 250 seconds.    A 14 Fr transfemoral E-sheath was introduced into the right femoral artery after progressively dilating over an Amplatz superstiff wire. An AL-2 catheter was used to direct a straight-tip exchange length wire across the native aortic valve into the left ventricle. This was exchanged out for a pigtail catheter and position was confirmed in the LV apex. Simultaneous LV and Ao pressures were recorded.  The pigtail catheter was then exchanged for an Amplatz Extra-stiff wire in the LV apex.    TRANSCATHETER HEART VALVE DEPLOYMENT:  An Edwards Sapien 3 THV (size 26 mm) was prepared and crimped per manufacturer's guidelines, and the proper orientation of the valve is confirmed on the [employer] delivery system. The valve was advanced through the introducer sheath using normal technique until in an appropriate position in the abdominal aorta beyond the sheath tip. The balloon was then retracted and using the fine-tuning wheel was centered on the valve. The valve was then advanced across the aortic arch using appropriate flexion of the catheter. The valve was carefully positioned across the aortic valve annulus. The Commander catheter was retracted using normal technique. Once final position of the valve has been confirmed by angiographic assessment, the valve is deployed while temporarily holding ventilation and during rapid ventricular pacing to maintain systolic blood pressure < 50 mmHg and pulse pressure < 10 mmHg. The balloon inflation is held for >3 seconds after reaching full deployment volume. Once the balloon has fully deflated the balloon is retracted into the ascending aorta and valve function is assessed using TTE. There is felt to be a trivial paravalvular leak and no central aortic insufficiency.  The patient's hemodynamic recovery following valve deployment is good.  The deployment balloon and guidewire are both removed. Echo demostrated acceptable post-procedural gradients, stable mitral valve function, and trivial AI.   PROCEDURE COMPLETION:  The sheath was then removed and closure devices were completed. Protamine was administered once femoral arterial repair was complete. The temporary pacemaker, pigtail catheters and femoral sheaths were removed with Mynx closure device placed in the artery and manual pressure used for venous hemostasis.    The patient tolerated the procedure well and is transported to the surgical intensive care in stable condition. There were no  immediate intraoperative complications. All sponge instrument and needle  counts are verified correct at completion of the operation.   No blood products were administered during the operation.  The patient received a total of 80 mL of intravenous contrast during the procedure.  Lauree Chandler MD 12/06/2018 11:18 AM

## 2018-12-06 NOTE — Anesthesia Procedure Notes (Signed)
Arterial Line Insertion Start/End7/11/2018 9:00 AM, 12/06/2018 9:05 AM Performed by: Suzette Battiest, MD, anesthesiologist  Patient location: Pre-op. Preanesthetic checklist: patient identified, IV checked, site marked, risks and benefits discussed, surgical consent, monitors and equipment checked, pre-op evaluation, timeout performed and anesthesia consent Lidocaine 1% used for infiltration radial was placed Catheter size: 20 Fr Hand hygiene performed  and maximum sterile barriers used   Attempts: 1 Procedure performed without using ultrasound guided technique. Following insertion, dressing applied and Biopatch. Post procedure assessment: normal and unchanged  Patient tolerated the procedure well with no immediate complications.

## 2018-12-06 NOTE — Plan of Care (Signed)
  Problem: Education: Goal: Knowledge of General Education information will improve Description: Including pain rating scale, medication(s)/side effects and non-pharmacologic comfort measures Outcome: Progressing   Problem: Health Behavior/Discharge Planning: Goal: Ability to manage health-related needs will improve Outcome: Progressing   Problem: Clinical Measurements: Goal: Ability to maintain clinical measurements within normal limits will improve Outcome: Progressing Goal: Will remain free from infection Outcome: Progressing Goal: Diagnostic test results will improve Outcome: Progressing Goal: Cardiovascular complication will be avoided Outcome: Progressing   Problem: Activity: Goal: Risk for activity intolerance will decrease Outcome: Progressing   Problem: Nutrition: Goal: Adequate nutrition will be maintained Outcome: Progressing   Problem: Elimination: Goal: Will not experience complications related to bowel motility Outcome: Progressing Goal: Will not experience complications related to urinary retention Outcome: Progressing   Problem: Pain Managment: Goal: General experience of comfort will improve Outcome: Progressing   Problem: Safety: Goal: Ability to remain free from injury will improve Outcome: Progressing   Problem: Skin Integrity: Goal: Risk for impaired skin integrity will decrease Outcome: Progressing   Problem: Clinical Measurements: Goal: Respiratory complications will improve Outcome: Not Applicable   Problem: Coping: Goal: Level of anxiety will decrease Outcome: Not Applicable

## 2018-12-06 NOTE — Anesthesia Postprocedure Evaluation (Signed)
Anesthesia Post Note  Patient: KAESYN JOHNSTON  Procedure(s) Performed: TRANSCATHETER AORTIC VALVE REPLACEMENT, TRANSFEMORAL (N/A Chest) TRANSESOPHAGEAL ECHOCARDIOGRAM (TEE) (N/A )     Patient location during evaluation: PACU Anesthesia Type: MAC Level of consciousness: awake and alert Pain management: pain level controlled Vital Signs Assessment: post-procedure vital signs reviewed and stable Respiratory status: spontaneous breathing, nonlabored ventilation, respiratory function stable and patient connected to nasal cannula oxygen Cardiovascular status: stable and blood pressure returned to baseline Postop Assessment: no apparent nausea or vomiting Anesthetic complications: no    Last Vitals:  Vitals:   12/06/18 1300 12/06/18 1305  BP: (!) 86/57 (!) 86/63  Pulse: (!) 58 64  Resp: 13 12  Temp:    SpO2: 95% 93%    Last Pain:  Vitals:   12/06/18 1153  TempSrc: Temporal  PainSc: 0-No pain                 Tiajuana Amass

## 2018-12-06 NOTE — Progress Notes (Signed)
Patient ID: Vincent Velez, male   DOB: November 23, 1940, 78 y.o.   MRN: [id number] TCTS:  Hemodynamically stable  Rhythm is atrial fib 60-70 Up in chair and feels well. Groin sites look good.

## 2018-12-06 NOTE — Anesthesia Procedure Notes (Signed)
Procedure Name: MAC Date/Time: 12/06/2018 9:30 AM Performed by: Leonor Liv, CRNA Pre-anesthesia Checklist: Patient identified, Suction available, Emergency Drugs available and Patient being monitored Oxygen Delivery Method: Simple face mask Placement Confirmation: positive ETCO2 Dental Injury: Teeth and Oropharynx as per pre-operative assessment

## 2018-12-06 NOTE — Progress Notes (Signed)
Patient arrived the unit from PACU on a hospital bed. assessment completed see flow sheet, placed on tele ccmd notified, patient oriented to room and staff, bed in lowest position call bell within reach will continue to monitor.

## 2018-12-07 ENCOUNTER — Inpatient Hospital Stay (HOSPITAL_COMMUNITY): Payer: Medicare Other

## 2018-12-07 ENCOUNTER — Other Ambulatory Visit: Payer: Self-pay

## 2018-12-07 DIAGNOSIS — Z952 Presence of prosthetic heart valve: Secondary | ICD-10-CM

## 2018-12-07 DIAGNOSIS — Z954 Presence of other heart-valve replacement: Secondary | ICD-10-CM

## 2018-12-07 DIAGNOSIS — I350 Nonrheumatic aortic (valve) stenosis: Secondary | ICD-10-CM

## 2018-12-07 LAB — CBC
HCT: 34.4 % — ABNORMAL LOW (ref 39.0–52.0)
Hemoglobin: 11.6 g/dL — ABNORMAL LOW (ref 13.0–17.0)
MCH: 31.3 pg (ref 26.0–34.0)
MCHC: 33.7 g/dL (ref 30.0–36.0)
MCV: 92.7 fL (ref 80.0–100.0)
Platelets: 127 10*3/uL — ABNORMAL LOW (ref 150–400)
RBC: 3.71 MIL/uL — ABNORMAL LOW (ref 4.22–5.81)
RDW: 13.3 % (ref 11.5–15.5)
WBC: 9.2 10*3/uL (ref 4.0–10.5)
nRBC: 0 % (ref 0.0–0.2)

## 2018-12-07 LAB — GLUCOSE, CAPILLARY
Glucose-Capillary: 133 mg/dL — ABNORMAL HIGH (ref 70–99)
Glucose-Capillary: 139 mg/dL — ABNORMAL HIGH (ref 70–99)

## 2018-12-07 LAB — POCT I-STAT 4, (NA,K, GLUC, HGB,HCT)
Glucose, Bld: 159 mg/dL — ABNORMAL HIGH (ref 70–99)
HCT: 34 % — ABNORMAL LOW (ref 39.0–52.0)
Hemoglobin: 11.6 g/dL — ABNORMAL LOW (ref 13.0–17.0)
Potassium: 4.9 mmol/L (ref 3.5–5.1)
Sodium: 141 mmol/L (ref 135–145)

## 2018-12-07 LAB — BASIC METABOLIC PANEL
Anion gap: 9 (ref 5–15)
BUN: 11 mg/dL (ref 8–23)
CO2: 23 mmol/L (ref 22–32)
Calcium: 8.7 mg/dL — ABNORMAL LOW (ref 8.9–10.3)
Chloride: 104 mmol/L (ref 98–111)
Creatinine, Ser: 0.79 mg/dL (ref 0.61–1.24)
GFR calc Af Amer: >60 mL/min (ref >60)
GFR calc non Af Amer: >60 mL/min (ref >60)
Glucose, Bld: 133 mg/dL — ABNORMAL HIGH (ref 70–99)
Potassium: 3.7 mmol/L (ref 3.5–5.1)
Sodium: 136 mmol/L (ref 135–145)

## 2018-12-07 LAB — POCT ACTIVATED CLOTTING TIME
Activated Clotting Time: 131 seconds
Activated Clotting Time: 103 seconds

## 2018-12-07 LAB — ECHOCARDIOGRAM COMPLETE
Height: 69 in
Weight: 2906.54 oz

## 2018-12-07 LAB — MAGNESIUM: Magnesium: 1.8 mg/dL (ref 1.7–2.4)

## 2018-12-07 MED ORDER — METOPROLOL TARTRATE 5 MG/5ML IV SOLN
5.0000 mg | Freq: Once | INTRAVENOUS | Status: AC
  Administered 2018-12-07: 5 mg via INTRAVENOUS
  Filled 2018-12-07: qty 5

## 2018-12-07 MED ORDER — POTASSIUM CHLORIDE CRYS ER 20 MEQ PO TBCR
40.0000 meq | EXTENDED_RELEASE_TABLET | Freq: Once | ORAL | Status: AC
  Administered 2018-12-07: 40 meq via ORAL
  Filled 2018-12-07: qty 2

## 2018-12-07 MED ORDER — ASPIRIN 81 MG PO CHEW: 81.0000 mg | CHEWABLE_TABLET | Freq: Every day | ORAL | Status: DC

## 2018-12-07 MED ORDER — METOPROLOL SUCCINATE ER 25 MG PO TB24
25.0000 mg | ORAL_TABLET | Freq: Every day | ORAL | Status: DC
  Administered 2018-12-07: 25 mg via ORAL
  Filled 2018-12-07: qty 1

## 2018-12-07 NOTE — Plan of Care (Signed)
  Problem: Education: Goal: Knowledge of General Education information will improve Description: Including pain rating scale, medication(s)/side effects and non-pharmacologic comfort measures Outcome: Completed/Met   Problem: Health Behavior/Discharge Planning: Goal: Ability to manage health-related needs will improve Outcome: Completed/Met   Problem: Clinical Measurements: Goal: Ability to maintain clinical measurements within normal limits will improve Outcome: Completed/Met Goal: Will remain free from infection Outcome: Completed/Met Goal: Diagnostic test results will improve Outcome: Completed/Met Goal: Cardiovascular complication will be avoided Outcome: Completed/Met   Problem: Activity: Goal: Risk for activity intolerance will decrease Outcome: Completed/Met

## 2018-12-07 NOTE — Progress Notes (Signed)
Patient in a stable condition, discharge education reviewed with patient he verbalized understanding, iv removed, tele dc ccmd notified, patient belongings at bedside, patient awaiting his wife for transportation home.

## 2018-12-07 NOTE — Progress Notes (Deleted)
[facility] VALVE TEAM  Discharge Summary    Patient ID: Vincent Velez MRN: [id number]; DOB: 10-27-1940  Admit date: 12/06/2018 Discharge date: 12/07/2018  Primary Care Provider: [name], MD  Primary Cardiologist: Candee Furbish, MD / Dr. Angelena Form & Dr. Cyndia Bent (TAVR)  Discharge Diagnoses    Principal Problem:   S/P TAVR (transcatheter aortic valve replacement) Active Problems:   GERD   Aneurysm of abdominal vessel (HCC)   Persistent atrial fibrillation   Chronic anticoagulation   Severe aortic stenosis   Peripheral vascular disease (HCC)   Hypertension   Hyperlipemia   Diabetes mellitus (HCC)   COPD (chronic obstructive pulmonary disease) (HCC)   Lung nodule   Allergies No Known Allergies  Diagnostic Studies/Procedures     TAVR OPERATIVE NOTE   Date of Procedure:                12/06/2018  Preoperative Diagnosis:      Severe Aortic Stenosis   Postoperative Diagnosis:    Same   Procedure:        Transcatheter Aortic Valve Replacement - Percutaneous Right Transfemoral Approach             Edwards Sapien 3 THV (size 26 mm, model # 9600TFX, serial # [id number])              Co-Surgeons:                        Gaye Pollack, MD and Lauree Chandler, MD   Anesthesiologist:                  Deatra Canter, MD  Echocardiographer:              Ena Dawley, MD  Pre-operative Echo Findings: ? Severe aortic stenosis ? Mild left ventricular systolic dysfunction  Post-operative Echo Findings: ? mild paravalvular leak ? Mild unchanged left ventricular systolic dysfunction  _____________   Echo 12/07/18:  IMPRESSIONS  1. The left ventricle has low normal systolic function, with an ejection fraction of 50-55%. The cavity size was normal. Left ventricular diastolic function could not be evaluated secondary to atrial fibrillation. No evidence of left ventricular  regional wall motion abnormalities.  2. The  right ventricle has normal systolic function. The cavity was normal. There is no increase in right ventricular wall thickness. Right ventricular systolic pressure could not be assessed.  3. Left atrial size was severely dilated.  4. There is moderate dilatation of the ascending aorta measuring 44 mm.  5. The inferior vena cava was normal in size with <50% respiratory variability.  6. - TAVR: S/P Edwards Sapien 3 THV 80mm that appears to be functioning normally. There is mild perivalvular AI. The mean AVG is 36mmHg. AV Vmax: 264.60 cm/s. LVOT/AV VTI ratio: 0.50. AV Area (Vmax): 2.44 cm.  7. Compared to prior echo, a TAVR is now present.   History of Present Illness     Vincent Velez is a 78 y.o. male with a history of AAA s/p EVAR 2010 w/ Dr Oneida Alar, arthritis,persistentatrial fibrillation/flutter on Xarelto, CAD, ongoing tobacco abuse with COPD, DMT2, GERD, HTN, HLD, prostate CA, 2.5 cm LUL lung mass that is hypermetabolic on PET scan suspicious for bronchogenic carcinoma and severe AS who presented to [facility] on 12/06/18 for planned TAVR.   He had a history of moderate aortic stenosis diagnosed by echocardiogram in May 2017 when the mean gradient was 25 mmHg.  The patient developed progressive exertional fatigue and shortness of breath over the past several months and a follow-up echocardiogram on 08/04/2018 showed progression to severe aortic stenosis with a mean gradient of 41 mmHg, a peak gradient of 61 mmHg, and a dimensionless index of 0.19.  Left ventricular ejection fraction is mildly reduced at 45 to 50% with mild left ventricular dilatation.  Cardiac catheterization on 10/26/2018 showed mild nonobstructive coronary disease. The mean gradient and cath was 16.7 mmHg with a peak to peak gradient of 21 mmHg. Unfortunately, pre TAVR CT chest showed a 2.4 x 2.3 cm macrolobulated and slightly spiculated lung mass in the anterior aspect of the lingula suspicious for lung cancer. Subsequent PET scan showed  the left upper lobe nodule to be hypermetabolic consistent with bronchogenic carcinoma. There are borderline enlarged lymph nodes in the anterior mediastinum and aortopulmonary window which have low-grade hypermetabolic activity which is similar to or below the blood pool activity and therefore not diagnostic of metastatic carcinoma. Plan was made for TAVR followed by bronchoscopy and mediastinoscopy to assess the mediastinal lymph nodes for microscopic metastasis and if negative consider resection of the left upper lobe lung mass by wedge resection or lobectomy depending on his pulmonary function test.   He was set up for TAVR on 12/06/18.  Hospital Course     Consultants: none  Severe AS: s/p successful TAVR with a 26 mm Edwards Sapien 3 THV via the TF approach on 7/72020. Post operative echo showed EF 50-55%, normally functioning TAVR with mean gradient 19 mmHg and mild PVL. Groin sites are stable. ECG with afib with CVR and no high grade heart block. Continue ASA and Xarelto. I will see him back in 1 week for TOC follow up.   Persistent atrial fibrillation: rate initially slow post operatively but then developed afib with RVR. Given one dose of Iv lopressor and resumed on home Toprol XL 25mg  daily. HR now stable in the 70-80s. Resume home Xarelto 20mg  daily.  DMT2: resume home regimen. Hold Metformin 48 hours after contrast dye exposure. Okay to resume 12/08/18 PM  HTN: BP under good control. Resume home Toprol XL 25mg  daily  Pulmonary nodule: 2.5 cm LUL lung mass that is hypermetabolic on PET scan suspicious for bronchogenic carcinoma. Plan for bronchoscopy and mediastinoscopy to assess the mediastinal lymph nodes for microscopic metastasis. Dr. Vivi Martens office will reach out to patient to have this set up.   _____________  Discharge Vitals Blood pressure 120/90, pulse 79, temperature 98.9 F (37.2 C), temperature source Oral, resp. rate 19, height 5\' 9"  (1.753 m), weight 82.4 kg, SpO2 98 %.    Filed Weights   12/06/18 0727 12/07/18 0500  Weight: 81.4 kg 82.4 kg    Labs & Radiologic Studies    CBC Recent Labs    12/06/18 0942 12/07/18 0451  WBC  --  9.2  HGB 12.6* 11.6*  HCT 37.0* 34.4*  MCV  --  92.7  PLT  --  756*   Basic Metabolic Panel Recent Labs    12/06/18 0942 12/07/18 0451  NA 141 136  K 3.7 3.7  CL  --  104  CO2  --  23  GLUCOSE 146* 133*  BUN  --  11  CREATININE  --  0.79  CALCIUM  --  8.7*  MG  --  1.8   Liver Function Tests No results for input(s): AST, ALT, ALKPHOS, BILITOT, PROT, ALBUMIN in the last 72 hours. No results for input(s): LIPASE, AMYLASE in  the last 72 hours. Cardiac Enzymes No results for input(s): CKTOTAL, CKMB, CKMBINDEX, TROPONINI in the last 72 hours. BNP Invalid input(s): POCBNP D-Dimer No results for input(s): DDIMER in the last 72 hours. Hemoglobin A1C No results for input(s): HGBA1C in the last 72 hours. Fasting Lipid Panel No results for input(s): CHOL, HDL, LDLCALC, TRIG, CHOLHDL, LDLDIRECT in the last 72 hours. Thyroid Function Tests No results for input(s): TSH, T4TOTAL, T3FREE, THYROIDAB in the last 72 hours.  Invalid input(s): FREET3 _____________  Dg Chest 2 View  Result Date: 12/01/2018 CLINICAL DATA:  Preop evaluation for TAVR EXAM: CHEST - 2 VIEW COMPARISON:  11/10/2018 FINDINGS: Cardiac shadow is within normal limits. Aortic calcifications are seen. The known lingular lesion is not as well appreciated on this exam is on prior CTs. No effusion is seen. No bony abnormality is noted. IMPRESSION: Known lingular lesion is not as well appreciated on today's exam. No acute abnormality noted. The Electronically Signed   By: Inez Catalina M.D.   On: 12/01/2018 16:13   Nm Pet Image Initial (pi) Skull Base To Thigh  Result Date: 11/10/2018 CLINICAL DATA:  Initial treatment strategy for solitary pulmonary nodule. EXAM: NUCLEAR MEDICINE PET SKULL BASE TO THIGH TECHNIQUE: 9.4 mCi F-18 FDG was injected intravenously.  Full-ring PET imaging was performed from the skull base to thigh after the radiotracer. CT data was obtained and used for attenuation correction and anatomic localization. Fasting blood glucose: 146 mg/dl COMPARISON:  10/31/2026 FINDINGS: Mediastinal blood pool activity: SUV max 2.8 Liver activity: SUV max NA NECK: No significant abnormal hypermetabolic activity in this region. Incidental CT findings: Mild chronic right ethmoid sinusitis. CHEST: The 2.6 by 2.2 cm left upper lobe nodule has a maximum SUV of 4.7 AP window lymph node 0.9 cm in short axis on image 61/4, maximum SUV 2.3. Right lower paratracheal node 0.9 cm in short axis on image 61/4, maximum SUV 2.9. Incidental CT findings: Coronary, aortic arch, and branch vessel atherosclerotic vascular disease. Mild cardiomegaly. Calcifications of the aortic valve. ABDOMEN/PELVIS: Scattered accentuated activity in bowel, likely physiologic. 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 Incidental CT findings: Aortoiliac atherosclerotic vascular disease. Aortoiliac stent graft traversing infrarenal abdominal aortic aneurysm. Prominent stool throughout the colon favors constipation. Prostate brachytherapy seeds noted. SKELETON: No significant abnormal hypermetabolic activity in this region. Incidental CT findings: Thoracic kyphosis. Mild levoconvex lumbar scoliosis with rotary component. Calcifications in the soft tissues of the right inferior buttock, probably from heterotopic ossification or dystrophic calcification. IMPRESSION: 1. The anterior left upper lobe nodule is hypermetabolic with SUV of 4.7, highly suspicious for malignancy. 2. Generally low-grade activity in the upper normal size mediastinal lymph nodes, similar to or below the blood pool activity. 3. Other imaging findings of potential clinical significance:  Aortic Atherosclerosis (ICD10-I70.0). Coronary atherosclerosis. Mild cardiomegaly. Aortic valve calcification. 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 Prominent stool throughout the colon favors constipation. Aortoiliac stent graft. Electronically Signed   By: Van Clines M.D.   On: 11/10/2018 14:45   Dg Chest Port 1 View  Result Date: 12/06/2018 CLINICAL DATA:  78 year old male with a history of TAVR EXAM: PORTABLE CHEST 1 VIEW COMPARISON:  12/01/2018, CT 11/10/2018, 10/31/2018 FINDINGS: Cardiomediastinal silhouette unchanged in size and contour. Interval changes of trans arterial aortic valve repair. No pneumothorax or pleural effusion. Coarsened interstitial markings throughout,  similar to prior. No new confluent airspace disease. Known lingular lesion not well visualized. No displaced fracture. IMPRESSION: Interval changes of trans arterial aortic valve repair. Chronic lung changes, with poor visualization of the known lingular lung lesion. Electronically Signed   By: Corrie Mckusick D.O.   On: 12/06/2018 15:24   Disposition   Pt is being discharged home today in good condition.  Follow-up Plans & Appointments    Follow-up Information    Eileen Stanford, PA-C. Go on 12/14/2018.   Specialties: Cardiology, Radiology Why: @ 12:30pm. Please arrive at 12:15pm  Contact information: [place] [id number] [phone]          Discharge Instructions    Amb Referral to Cardiac Rehabilitation   Complete by: As directed    Diagnosis: Valve Replacement   Valve: Aortic Comment - TAVR   After initial evaluation and assessments completed: Virtual Based Care may be provided alone or in conjunction with Phase 2 Cardiac Rehab based on patient barriers.: No      Discharge Medications   Allergies as of 12/07/2018   No Known Allergies     Medication List    STOP taking these medications   ibuprofen 200 MG tablet Commonly known as:  ADVIL     TAKE these medications   aspirin 81 MG chewable tablet Chew 1 tablet (81 mg total) by mouth daily. Start taking on: December 08, 2018   dorzolamide 2 % ophthalmic solution Commonly known as: TRUSOPT Place 1 drop into both eyes 3 (three) times daily.   esomeprazole 40 MG capsule Commonly known as: NEXIUM Take 40 mg by mouth daily.   glimepiride 2 MG tablet Commonly known as: AMARYL Take 2 mg by mouth daily.   metFORMIN 500 MG 24 hr tablet Commonly known as: GLUCOPHAGE-XR Take 1,000 mg by mouth every evening. Notes to patient: Hold 48 hours after contrast dye exposure. Okay to resume 12/08/18 PM   metoprolol succinate 25 MG 24 hr tablet Commonly known as: TOPROL-XL Take 1 tablet (25 mg total) by mouth daily.   multivitamin with minerals tablet Take 1 tablet by mouth daily.   simvastatin 40 MG tablet Commonly known as: ZOCOR Take 40 mg by mouth at bedtime.   Xarelto 20 MG Tabs tablet Generic drug: rivaroxaban TAKE 1 TABLET BY MOUTH  DAILY WITH SUPPER What changed: See the new instructions.         Outstanding Labs/Studies  none  Duration of Discharge Encounter   Greater than 30 minutes including physician time.  SignedAngelena Form, PA-C 12/07/2018, 12:56 PM [phone]

## 2018-12-07 NOTE — Discharge Instructions (Signed)

## 2018-12-07 NOTE — Progress Notes (Signed)
CARDIAC REHAB PHASE I   PRE:  Rate/Rhythm: 84 Afib with PVCs  BP:  Sitting: 105/72      SaO2: 94 RA  MODE:  Ambulation: 450 ft   POST:  Rate/Rhythm: 113 Afib with PVCs  BP:  Sitting: 120/90    SaO2: 97 RA   Pt ambulated 414ft in hallway standby assist with steady gait. Pt denies CP, or SOB; does c/o of light dizziness. Pt given heart healthy and diabetic diets. Reviewed site care, restrictions, and exercise guidelines. Pt agreeable to smoking cessation, tip sheet given. Will refer to CRP II [place], not able to participate in Virtual Cardiac Rehab.  [id number] Rufina Falco, RN BSN 12/07/2018 11:51 AM

## 2018-12-07 NOTE — Progress Notes (Signed)
Throughout night with any activity (voiding, ambulation, adjusting in bed) pt HR would increase to 120s-150s Afib but with rest would become rate controlled @70s -100s. Conveyed to Day Shift for Primary MD. Pt otherwise as symptomatic.

## 2018-12-07 NOTE — Progress Notes (Signed)
Patient sitting in the recliner and eating. Heart rate in the 160's non sustaining, patient stated that he does not feel good. [place] notified, Raynelle Chary will come and assess patient. No new orders received will continue to monitor.

## 2018-12-07 NOTE — Progress Notes (Signed)
1 Day Post-Op Procedure(s) (LRB): TRANSCATHETER AORTIC VALVE REPLACEMENT, TRANSFEMORAL (N/A) TRANSESOPHAGEAL ECHOCARDIOGRAM (TEE) (N/A) Subjective: Says he doesn't feel as good today as yesterday but denies shortness of breath or chest pain. Says his shoulders hurt. He has chronic shoulder problems.  Has had rapid rhythm in chronic atrial fib overnight and is 120-140 this am. Beta blocker not restarted yet.  Objective: Vital signs in last 24 hours: Temp:  [97.3 F (36.3 C)-99.7 F (37.6 C)] 99.7 F (37.6 C) (07/08 0723) Pulse Rate:  [0-137] 101 (07/08 0723) Cardiac Rhythm: Atrial fibrillation (07/08 0748) Resp:  [0-24] 24 (07/08 0817) BP: (81-154)/(52-87) 119/75 (07/08 0723) SpO2:  [0 %-100 %] 93 % (07/08 0723) Weight:  [82.4 kg] 82.4 kg (07/08 0500)  Hemodynamic parameters for last 24 hours:    Intake/Output from previous day: 07/07 0701 - 07/08 0700 In: 3004.8 [P.O.:1200; I.V.:1404.8; IV Piggyback:400] Out: 2100 [Urine:2075; Blood:25] Intake/Output this shift: Total I/O In: 240 [P.O.:240] Out: -   General appearance: alert and cooperative Neurologic: intact Heart: irregularly irregular rhythm Lungs: clear to auscultation bilaterally Extremities: extremities normal, atraumatic, no cyanosis or edema Wound: groin sites look fine.  Lab Results: Recent Labs    12/06/18 0942 12/07/18 0451  WBC  --  9.2  HGB 12.6* 11.6*  HCT 37.0* 34.4*  PLT  --  127*   BMET:  Recent Labs    12/06/18 0942 12/07/18 0451  NA 141 136  K 3.7 3.7  CL  --  104  CO2  --  23  GLUCOSE 146* 133*  BUN  --  11  CREATININE  --  0.79  CALCIUM  --  8.7*    PT/INR:  Recent Labs    12/06/18 0711  LABPROT 14.4  INR 1.1   ABG    Component Value Date/Time   PHART 7.410 12/01/2018 1046   HCO3 21.2 12/01/2018 1046   TCO2 21 (L) 10/26/2018 1005   ACIDBASEDEF 2.7 (H) 12/01/2018 1046   O2SAT 98.8 12/01/2018 1046   CBG (last 3)  Recent Labs    12/06/18 1625 12/06/18 2115  12/07/18 0617  GLUCAP 94 123* 133*    Assessment/Plan: S/P Procedure(s) (LRB): TRANSCATHETER AORTIC VALVE REPLACEMENT, TRANSFEMORAL (N/A) TRANSESOPHAGEAL ECHOCARDIOGRAM (TEE) (N/A)  POD 1 Hemodynamically stable  Persistent atrial fib with RVR. Will give him a dose a IV Lopressor this am and resume Toprol 25 as before. HR 70's on preop ECG.  K+ 3.7 so will give him a dose po.  2D echo this am.  Continue mobilization and he may be able to go home later if HR under control.  Plan to resume Xarelto and baby ASA at discharge.   LOS: 1 day    Gaye Pollack 12/07/2018

## 2018-12-08 ENCOUNTER — Encounter: Payer: Self-pay | Admitting: Thoracic Surgery (Cardiothoracic Vascular Surgery)

## 2018-12-08 ENCOUNTER — Telehealth (HOSPITAL_COMMUNITY): Payer: Self-pay

## 2018-12-08 ENCOUNTER — Telehealth: Payer: Self-pay | Admitting: Physician Assistant

## 2018-12-08 NOTE — Discharge Summary (Addendum)
[facility] TE[facility]  Discharge Summary    Patient ID: Vincent Velez MRN: [id number]; DOB: [birth year]  Admit date: 12/06/2018 Discharge date: 12/07/2018  Primary Care Provider: [name], MD  Primary Cardiologist: Candee Furbish, MD / Dr. Angelena Form & Dr. Cyndia Bent (TAVR)  Discharge Diagnoses    Principal Problem:   S/P TAVR (transcatheter aortic valve replacement) Active Problems:   GERD   Aneurysm of abdominal vessel (HCC)   Persistent atrial fibrillation   Chronic anticoagulation   Severe aortic stenosis   Peripheral vascular disease (HCC)   Hypertension   Hyperlipemia   Diabetes mellitus (HCC)   COPD (chronic obstructive pulmonary disease) (HCC)   Lung nodule   Allergies No Known Allergies  Diagnostic Studies/Procedures     TAVR OPERATIVE NOTE   Date of Procedure:12/06/2018  Preoperative Diagnosis:Severe Aortic Stenosis   Postoperative Diagnosis:Same   Procedure:   Transcatheter Aortic Valve Replacement - PercutaneousRightTransfemoral Approach Edwards Sapien 3 THV (size 76mm, model # 9600TFX, serial # [id number])  Co-Surgeons:Bryan Alveria Apley, MD and Lauree Chandler, MD   Anesthesiologist:R. Ola Spurr, MD  Dala Dock, MD  Pre-operative Echo Findings: ? Severe aortic stenosis ? Mildleft ventricular systolic dysfunction  Post-operative Echo Findings: ? mildparavalvular leak ? Mild unchangedleft ventricular systolic dysfunction  _____________   Echo 12/07/18:  IMPRESSIONS 1. The left ventricle has low normal systolic function, with an ejection fraction of 50-55%. The cavity size was normal. Left ventricular diastolic function could not be evaluated secondary to atrial fibrillation. No evidence of left ventricular  regional wall motion  abnormalities. 2. The right ventricle has normal systolic function. The cavity was normal. There is no increase in right ventricular wall thickness. Right ventricular systolic pressure could not be assessed. 3. Left atrial size was severely dilated. 4. There is moderate dilatation of the ascending aorta measuring 44 mm. 5. The inferior vena cava was normal in size with <50% respiratory variability. 6. - TAVR: S/P Edwards Sapien 3 THV 22mm that appears to be functioning normally. There is mild perivalvular AI. The mean AVG is 3mmHg. AV Vmax: 264.60 cm/s. LVOT/AV VTI ratio: 0.50. AV Area (Vmax): 2.44 cm. 7. Compared to prior echo, a TAVR is now present.   History of Present Illness     LEXTON Velez is a 78 y.o. male with a history of AAA s/p EVAR 2010 w/ Dr Oneida Alar, arthritis,persistentatrial fibrillation/flutter on Xarelto, CAD, ongoing tobacco abuse with COPD, DMT2, GERD, HTN, HLD, prostate CA, 2.5 cm LUL lung mass that is hypermetabolic on PET scan suspicious for bronchogenic carcinoma and severe AS who presented to [facility] on 12/06/18 for planned TAVR.   He had a history of moderate aortic stenosis diagnosed by echocardiogram in May 2017 when the mean gradient was 25 mmHg. The patient developed progressive exertional fatigue and shortness of breath over the past several months and a follow-up echocardiogram on 08/04/2018 showed progression to severe aortic stenosis with a mean gradient of 41 mmHg, a peak gradient of 61 mmHg, and a dimensionless index of 0.19. Left ventricular ejection fraction is mildly reduced at 45 to 50% with mild left ventricular dilatation. Cardiac catheterization on 10/26/2018 showed mild nonobstructive coronary disease. The mean gradient and cath was 16.7 mmHg with a peak to peak gradient of 21 mmHg. Unfortunately, pre TAVR CT chest showed a 2.4 x 2.3 cm macrolobulated and slightly spiculated lung mass in the anterior aspect of the lingula suspicious for lung cancer.  Subsequent PET scan showed the left upper lobe nodule  to be hypermetabolic consistent with bronchogenic carcinoma. There are borderline enlarged lymph nodes in the anterior mediastinum and aortopulmonary window which have low-grade hypermetabolic activity which is similar to or below the blood pool activity and therefore not diagnostic of metastatic carcinoma. Plan was made for TAVR followed bybronchoscopy and mediastinoscopy to assess the mediastinal lymph nodes for microscopic metastasis and if negative consider resection of the left upper lobe lung mass by wedge resection or lobectomy depending on his pulmonary function test.   He was set up for TAVR on 12/06/18.  Hospital Course     Consultants: none  Severe AS: s/p successful TAVR with a 26 mm Edwards Sapien 3 THV via the TF approach on 7/72020. Post operative echo showed EF 50-55%, normally functioning TAVR with mean gradient 19 mmHg and mild PVL. Groin sites are stable. ECG with afib with CVR and no high grade heart block. Continue ASA and Xarelto. I will see him back in 1 week for TOC follow up.   Persistent atrial fibrillation: rate initially slow post operatively but then developed afib with RVR. Given one dose of Iv lopressor and resumed on home Toprol XL 25mg  daily. HR now stable in the 70-80s. Resume home Xarelto 20mg  daily.  DMT2: resume home regimen. Hold Metformin 48 hours after contrast dye exposure. Okay to resume 12/08/18 PM  HTN: BP under good control. Resume home Toprol XL 25mg  daily  Pulmonary nodule: 2.5 cm LUL lung mass that is hypermetabolic on PET scan suspicious for bronchogenic carcinoma. Plan for bronchoscopy and mediastinoscopy to assess the mediastinal lymph nodes for microscopic metastasis. Dr. Vivi Martens office will reach out to patient to have this set up.   _____________  Discharge Vitals Blood pressure 120/90, pulse 79, temperature 98.9 F (37.2 C), temperature source Oral, resp. rate 19, height 5\' 9"   (1.753 m), weight 82.4 kg, SpO2 98 %.      Filed Weights   12/06/18 0727 12/07/18 0500  Weight: 81.4 kg 82.4 kg    Labs & Radiologic Studies    CBC Recent Labs (last 2 labs)       Recent Labs    12/06/18 0942 12/07/18 0451  WBC  --  9.2  HGB 12.6* 11.6*  HCT 37.0* 34.4*  MCV  --  92.7  PLT  --  127*     Basic Metabolic Panel Recent Labs (last 2 labs)       Recent Labs    12/06/18 0942 12/07/18 0451  NA 141 136  K 3.7 3.7  CL  --  104  CO2  --  23  GLUCOSE 146* 133*  BUN  --  11  CREATININE  --  0.79  CALCIUM  --  8.7*  MG  --  1.8     Liver Function Tests Recent Labs (last 2 labs)   No results for input(s): AST, ALT, ALKPHOS, BILITOT, PROT, ALBUMIN in the last 72 hours.   Recent Labs (last 2 labs)   No results for input(s): LIPASE, AMYLASE in the last 72 hours.   Cardiac Enzymes Recent Labs (last 2 labs)   No results for input(s): CKTOTAL, CKMB, CKMBINDEX, TROPONINI in the last 72 hours.   BNP Recent Labs (last 2 labs)   Invalid input(s): POCBNP   D-Dimer Recent Labs (last 2 labs)   No results for input(s): DDIMER in the last 72 hours.   Hemoglobin A1C Recent Labs (last 2 labs)   No results for input(s): HGBA1C in the last 72 hours.  Fasting Lipid Panel Recent Labs (last 2 labs)   No results for input(s): CHOL, HDL, LDLCALC, TRIG, CHOLHDL, LDLDIRECT in the last 72 hours.   Thyroid Function Tests  Recent Labs (last 2 labs)   No results for input(s): TSH, T4TOTAL, T3FREE, THYROIDAB in the last 72 hours.  Invalid input(s): FREET3   _____________   Imaging Results  Dg Chest 2 View  Result Date: 12/01/2018 CLINICAL DATA:  Preop evaluation for TAVR EXAM: CHEST - 2 VIEW COMPARISON:  11/10/2018 FINDINGS: Cardiac shadow is within normal limits. Aortic calcifications are seen. The known lingular lesion is not as well appreciated on this exam is on prior CTs. No effusion is seen. No bony abnormality is noted. IMPRESSION: Known lingular lesion  is not as well appreciated on today's exam. No acute abnormality noted. The Electronically Signed   By: Inez Catalina M.D.   On: 12/01/2018 16:13   Nm Pet Image Initial (pi) Skull Base To Thigh  Result Date: 11/10/2018 CLINICAL DATA:  Initial treatment strategy for solitary pulmonary nodule. EXAM: NUCLEAR MEDICINE PET SKULL BASE TO THIGH TECHNIQUE: 9.4 mCi F-18 FDG was injected intravenously. Full-ring PET imaging was performed from the skull base to thigh after the radiotracer. CT data was obtained and used for attenuation correction and anatomic localization. Fasting blood glucose: 146 mg/dl COMPARISON:  10/31/2026 FINDINGS: Mediastinal blood pool activity: SUV max 2.8 Liver activity: SUV max NA NECK: No significant abnormal hypermetabolic activity in this region. Incidental CT findings: Mild chronic right ethmoid sinusitis. CHEST: The 2.6 by 2.2 cm left upper lobe nodule has a maximum SUV of 4.7 AP window lymph node 0.9 cm in short axis on image 61/4, maximum SUV 2.3. Right lower paratracheal node 0.9 cm in short axis on image 61/4, maximum SUV 2.9. Incidental CT findings: Coronary, aortic arch, and branch vessel atherosclerotic vascular disease. Mild cardiomegaly. Calcifications of the aortic valve. ABDOMEN/PELVIS: Scattered accentuated activity in bowel, likely physiologic. 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 Incidental CT findings: Aortoiliac atherosclerotic vascular disease. Aortoiliac stent graft traversing infrarenal abdominal aortic aneurysm. Prominent stool throughout the colon favors constipation. Prostate brachytherapy seeds noted. SKELETON: No significant abnormal hypermetabolic activity in this region. Incidental CT findings: Thoracic kyphosis. Mild levoconvex lumbar scoliosis with rotary component. Calcifications in the soft tissues of the  right inferior buttock, probably from heterotopic ossification or dystrophic calcification. IMPRESSION: 1. The anterior left upper lobe nodule is hypermetabolic with SUV of 4.7, highly suspicious for malignancy. 2. Generally low-grade activity in the upper normal size mediastinal lymph nodes, similar to or below the blood pool activity. 3. Other imaging findings of potential clinical significance: Aortic Atherosclerosis (ICD10-I70.0). Coronary atherosclerosis. Mild cardiomegaly. Aortic valve calcification. 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 Prominent stool throughout the colon favors constipation. Aortoiliac stent graft. Electronically Signed   By: Van Clines M.D.   On: 11/10/2018 14:45   Dg Chest Port 1 View  Result Date: 12/06/2018 CLINICAL DATA:  78 year old male with a history of TAVR EXAM: PORTABLE CHEST 1 VIEW COMPARISON:  12/01/2018, CT 11/10/2018, 10/31/2018 FINDINGS: Cardiomediastinal silhouette unchanged in size and contour. Interval changes of trans arterial aortic valve repair. No pneumothorax or pleural effusion. Coarsened interstitial markings throughout, similar to prior. No new confluent airspace disease. Known lingular lesion not well visualized. No displaced fracture. IMPRESSION: Interval changes of trans arterial  aortic valve repair. Chronic lung changes, with poor visualization of the known lingular lung lesion. Electronically Signed   By: Corrie Mckusick D.O.   On: 12/06/2018 15:24    Disposition   Pt is being discharged home today in good condition.  Follow-up Plans & Appointments       Follow-up Information    Eileen Stanford, PA-C. Go on 12/14/2018.   Specialties: Cardiology, Radiology Why: @ 12:30pm. Please arrive at 12:15pm  Contact information: [place] [id number] [phone]              Discharge Instructions    Amb Referral to Cardiac Rehabilitation   Complete by: As  directed    Diagnosis: Valve Replacement   Valve: Aortic Comment - TAVR   After initial evaluation and assessments completed: Virtual Based Care may be provided alone or in conjunction with Phase 2 Cardiac Rehab based on patient barriers.: No      Discharge Medications   Allergies as of 12/07/2018   No Known Allergies        Medication List    STOP taking these medications   ibuprofen 200 MG tablet Commonly known as: ADVIL     TAKE these medications   aspirin 81 MG chewable tablet Chew 1 tablet (81 mg total) by mouth daily. Start taking on: December 08, 2018   dorzolamide 2 % ophthalmic solution Commonly known as: TRUSOPT Place 1 drop into both eyes 3 (three) times daily.   esomeprazole 40 MG capsule Commonly known as: NEXIUM Take 40 mg by mouth daily.   glimepiride 2 MG tablet Commonly known as: AMARYL Take 2 mg by mouth daily.   metFORMIN 500 MG 24 hr tablet Commonly known as: GLUCOPHAGE-XR Take 1,000 mg by mouth every evening. Notes to patient: Hold 48 hours after contrast dye exposure. Okay to resume 12/08/18 PM   metoprolol succinate 25 MG 24 hr tablet Commonly known as: TOPROL-XL Take 1 tablet (25 mg total) by mouth daily.   multivitamin with minerals tablet Take 1 tablet by mouth daily.   simvastatin 40 MG tablet Commonly known as: ZOCOR Take 40 mg by mouth at bedtime.   Xarelto 20 MG Tabs tablet Generic drug: rivaroxaban TAKE 1 TABLET BY MOUTH  DAILY WITH SUPPER What changed: See the new instructions.         Outstanding Labs/Studies  none  Duration of Discharge Encounter   Greater than 30 minutes including physician time.  SignedAngelena Form, PA-C 12/07/2018, 12:56 PM [phone]

## 2018-12-08 NOTE — Telephone Encounter (Signed)
Called patient to see if he is interested in the Cardiac Rehab Program. Patient expressed interest. Explained scheduling process and went over insurance, patient verbalized understanding. Will contact patient for scheduling once f/u has been completed.  °

## 2018-12-08 NOTE — Telephone Encounter (Signed)
  [facility] VALVE TEAM   Patient contacted regarding discharge from [facility] on 12/07/18  Patient understands to follow up with provider Nell Range on 7/15 @12 :30pm at [facility].  Patient understands discharge instructions? yes Patient understands medications and regimen? yes Patient understands to bring all medications to this visit? Yes    Angelena Form PA-C  [facility]

## 2018-12-08 NOTE — Telephone Encounter (Signed)
Pt insurance is active and benefits verified through Medicare A/B. Co-pay $0.00, DED $0.00/$0.00 met, out of pocket $0.00/$0.00 met, co-insurance 0%. No pre-authorization required. Passport, 12/08/2018 @ 10:24AM, GXI#[id number]  2ndary insurance is active and benefits verified through [place]. Co-pay $0.00, DED $0.00/$0.00 met, out of pocket $0.00/$0.00 met, co-insurance 0%. No pre-authorization required. Passport, 12/08/2018 @ 10:26AM, ULG#[id number]  Will contact patient to see if he is interested in the Cardiac Rehab Program. If interested, patient will need to complete follow up appt. Once completed, patient will be contacted for scheduling upon review by the RN Navigator.

## 2018-12-08 NOTE — Telephone Encounter (Signed)
Pt insurance is active and benefits verified through Medicare A/B. Co-pay $0.00, DED $198.00/$198.00 met, out of pocket $0.00/$0.00 met, co-insurance 20%. No pre-authorization required. Passport, 12/08/2018 @ 10:24AM, [id number]  2ndary insurance is active and benefits verified through [place]. Co-pay $0.00, DED $0.00/$0.00 met, out of pocket $0.00/$0.00 met, co-insurance 0%. No pre-authorization required. Passport, 12/08/2018 @ 10:26AM, POL#[id number]  Will contact patient to see if he is interested in the Cardiac Rehab Program. If interested, patient will need to complete follow up appt. Once completed, patient will be contacted for scheduling upon review by the RN Navigator.

## 2018-12-12 NOTE — Progress Notes (Signed)
HEART AND [place]                                       Cardiology Office Note    Date:  12/14/2018   ID:  Vincent Velez, DOB 1941-04-02, MRN [id number]  PCP:  [name], MD  Cardiologist: Candee Furbish, MD/ Dr. Jacob Moores. [name])  CC: [facility] s/p TAVR  History of Present Illness:  Vincent Velez is a 78 y.o. 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  He had a history ofmoderate aortic stenosis diagnosed by echocardiogram in May 2017 with a mean gradient was 25 mmHg. The patientdevelopedprogressive exertional fatigue and shortness of breath over the past several months and a follow-up echocardiogram on 08/04/2018 showed progression to severe aortic stenosis with a mean gradient of 41 mmHg, a peak gradient of 61 mmHg, and a dimensionless index of 0.19. Left ventricular ejection fraction was mildly reduced at 45 to 50% with mild left ventricular dilatation.Cardiac catheterization on 10/26/2018 showed mild nonobstructive coronary disease.  Unfortunately, pre TAVR chest CT showeda 2.4 x 2.3 cm macrolobulated and slightly spiculated lung mass in the anterior aspect of the lingula suspicious for lung cancer. SubsequentPET scan showedthe left upper lobe nodule to be hypermetabolic consistent with bronchogenic carcinoma. There were borderline enlarged lymph nodes in the anterior mediastinum that were not diagnostic of metastatic carcinoma. Plan was made for TAVR followed bybronchoscopy and mediastinoscopy to assess the mediastinal lymph nodes for microscopic metastasis and if negative consider resection of the left upper lobe lung mass by  wedge resection or lobectomy depending on his pulmonary function test.  He underwent successful TAVR with a43mm Edwards Sapien 3 THV via the TF approach on 7/72020. Post operative echoshowed EF 50-55%, normally functioning TAVRwith mean gradient 19 mmHg and mild PVL. He was discharged on home Xarelto and aspirin.   Today he presents to clinic for follow up. He has been out doing some yard work. He thinks overall he can tell a difference in how he is feeling. No CP or SOB. No LE edema, orthopnea or PND. He has had some intermittent dizziness, mostly in the mornings but no syncope. No blood in stool or urine. No palpitations.     Past Medical History:  Diagnosis Date   AAA (abdominal aortic aneurysm) ([place])    Anxiety    Arthritis    Asthma    when younger   Atrial fibrillation (HCC)    CAD (coronary artery disease)    Cataract    removed both   COPD (chronic obstructive pulmonary disease) (HCC)    Diabetes mellitus    Esophageal reflux    Family history of malignant neoplasm of gastrointestinal tract    Hiatal hernia    Hyperlipemia    Hypertension    Lung nodule    a. PET scan highly suspicious for malignancy. Bronch with biopsy to be done after TAVR   Malignant neoplasm of prostate [facility])    prostate    Peripheral vascular disease ([place])    Stricture and stenosis of esophagus     Past Surgical History:  Procedure Laterality Date   ABDOMINAL AORTA STENT  CARDIAC CATHETERIZATION     CARDIOVERSION N/A 12/04/2015   Procedure: CARDIOVERSION;  Surgeon: Sanda Klein, MD;  Location: [place] [facility];  Service: Cardiovascular;  Laterality: N/A;   COLONOSCOPY     FINGER SURGERY     INSERTION PROSTATE RADIATION SEED     RIGHT/LEFT HEART CATH AND CORONARY ANGIOGRAPHY N/A 10/26/2018   Procedure: RIGHT/LEFT HEART CATH AND CORONARY ANGIOGRAPHY;  Surgeon: Burnell Blanks, MD;  Location: [place] [facility];  Service: Cardiovascular;  Laterality: N/A;    TEE WITHOUT CARDIOVERSION N/A 12/06/2018   Procedure: TRANSESOPHAGEAL ECHOCARDIOGRAM (TEE);  Surgeon: Burnell Blanks, MD;  Location: [place] [facility];  Service: Open Heart Surgery;  Laterality: N/A;   TRANSCATHETER AORTIC VALVE REPLACEMENT, TRANSFEMORAL N/A 12/06/2018   Procedure: TRANSCATHETER AORTIC VALVE REPLACEMENT, TRANSFEMORAL;  Surgeon: Burnell Blanks, MD;  Location: [place] [facility];  Service: Open Heart Surgery;  Laterality: N/A;   UPPER GASTROINTESTINAL ENDOSCOPY      Current Medications: Outpatient Medications Prior to Visit  Medication Sig Dispense Refill   aspirin 81 MG chewable tablet Chew 1 tablet (81 mg total) by mouth daily.     dorzolamide (TRUSOPT) 2 % ophthalmic solution Place 1 drop into both eyes 3 (three) times daily.     esomeprazole (NEXIUM) 40 MG capsule Take 40 mg by mouth daily.      glimepiride (AMARYL) 2 MG tablet Take 2 mg by mouth daily.     metFORMIN (GLUCOPHAGE-XR) 500 MG 24 hr tablet Take 1,000 mg by mouth every evening.      metoprolol succinate (TOPROL-XL) 25 MG 24 hr tablet Take 1 tablet (25 mg total) by mouth daily.     Multiple Vitamins-Minerals (MULTIVITAMIN WITH MINERALS) tablet Take 1 tablet by mouth daily.     simvastatin (ZOCOR) 40 MG tablet Take 40 mg by mouth at bedtime.       XARELTO 20 MG TABS tablet TAKE 1 TABLET BY MOUTH  DAILY WITH SUPPER (Patient taking differently: Take 20 mg by mouth daily with supper. ) 90 tablet 1   No facility-administered medications prior to visit.      Allergies:   Patient has no known allergies.   Social History   Socioeconomic History   Marital status: Married    Spouse name: Not on file   Number of children: 2   Years of education: Not on file   Highest education level: Not on file  Occupational History   Occupation: engineer-retired    Employer: [employer] resource strain: Not on file   Food insecurity    Worry: Not on file     Inability: Not on file   Transportation needs    Medical: Not on file    Non-medical: Not on file  Tobacco Use   Smoking status: Light Tobacco Smoker    Packs/day: 1.00    Types: Pipe, Cigars   Smokeless tobacco: Never Used  Substance and Sexual Activity   Alcohol use: No    Alcohol/week: 0.0 standard drinks   Drug use: No   Sexual activity: Not on file  Lifestyle   Physical activity    Days per week: Not on file    Minutes per session: Not on file   Stress: Not on file  Relationships   Social connections    Talks on phone: Not on file    Gets together: Not on file    Attends religious service: Not on file    Active member of club  or organization: Not on file    Attends meetings of clubs or organizations: Not on file    Relationship status: Not on file  Other Topics Concern   Not on file  Social History Narrative   Not on file     Family History:  The patient's family history includes Colon cancer in his father; Deep vein thrombosis in his mother; Diabetes in his mother; Heart attack in his brother; Heart disease in his mother; Hyperlipidemia in his mother; Hypertension in his brother and mother; Prostate cancer in his brother; Varicose Veins in his mother.      ROS:   Please see the history of present illness.    ROS All other systems reviewed and are negative.   PHYSICAL EXAM:   VS:  BP 122/82    Pulse 97    Ht 5\' 9"  (1.753 m)    Wt 179 lb 12.8 oz (81.6 kg)    SpO2 98%    BMI 26.55 kg/m    GEN: Well nourished, well developed, in no acute distress HEENT: normal Neck: no JVD or masses Cardiac: irreg irreg, soft flow murmur. no rubs, or gallops,no edema  Respiratory:  clear to auscultation bilaterally, normal work of breathing GI: soft, nontender, nondistended, + BS MS: no deformity or atrophy Skin: warm and dry, no rash. Groin sites with mild ecchymosis with no hematoma.  Neuro:  Alert and Oriented x 3, Strength and sensation are intact Psych: euthymic  mood, full affect    Wt Readings from Last 3 Encounters:  12/14/18 179 lb 12.8 oz (81.6 kg)  12/07/18 181 lb 10.5 oz (82.4 kg)  12/01/18 179 lb 8 oz (81.4 kg)      Studies/Labs Reviewed:   EKG:  EKG is ordered today.  The ekg ordered today demonstrates afib with HR 71, non specific ST/ TW abnormalities.   Recent Labs: 12/01/2018: ALT 15; B Natriuretic Peptide 72.4 12/07/2018: BUN 11; Creatinine, Ser 0.79; Hemoglobin 11.6; Magnesium 1.8; Platelets 127; Potassium 3.7; Sodium 136   Lipid Panel    Component Value Date/Time   CHOL 120 09/15/2013 1428   TRIG 110.0 09/15/2013 1428   HDL 37.70 (L) 09/15/2013 1428   CHOLHDL 3 09/15/2013 1428   VLDL 22.0 09/15/2013 1428   [place] 60 09/15/2013 1428    Additional studies/ records that were reviewed today include:  TAVR OPERATIVE NOTE   Date of Procedure:12/06/2018  Preoperative Diagnosis:Severe Aortic Stenosis   Postoperative Diagnosis:Same   Procedure:   Transcatheter Aortic Valve Replacement - PercutaneousRightTransfemoral Approach Edwards Sapien 3 THV (size 22mm, model # 9600TFX, serial # [id number])  Co-Surgeons:Bryan Alveria Apley, MD and Lauree Chandler, MD   Anesthesiologist:R. Ola Spurr, MD  Dala Dock, MD  Pre-operative Echo Findings: ? Severe aortic stenosis ? Mildleft ventricular systolic dysfunction  Post-operative Echo Findings: ? mildparavalvular leak ? Mild unchangedleft ventricular systolic dysfunction  _____________  Echo 12/07/18: IMPRESSIONS 1. The left ventricle has low normal systolic function, with an ejection fraction of 50-55%. The cavity size was normal. Left ventricular diastolic function could not be evaluated secondary to atrial fibrillation. No evidence of left ventricular  regional wall motion abnormalities. 2. The right ventricle has  normal systolic function. The cavity was normal. There is no increase in right ventricular wall thickness. Right ventricular systolic pressure could not be assessed. 3. Left atrial size was severely dilated. 4. There is moderate dilatation of the ascending aorta measuring 44 mm. 5. The inferior vena cava was normal in size with <50% respiratory variability. 6. -  TAVR: S/P Edwards Sapien 3 THV 29mm that appears to be functioning normally. There is mild perivalvular AI. The mean AVG is 19mmHg. AV Vmax: 264.60 cm/s. LVOT/AV VTI ratio: 0.50. AV Area (Vmax): 2.44 cm. 7. Compared to prior echo, a TAVR is now present.   Assessment and Plan     Severe AS s/pTAVR: doing well. Groin sites are healing well. ECG with no HAVB. SBE prophylaxis discussed; I have RX'd amoxicillin. Continue Aspirin and Xarelto 20mg  daily.  I will see him back in 1 month for echo and follow up  Persistent atrial fibrillation: rate well controlled. Continue Toprol XL 25mg  daily and Xarelto 20mg  daily.   HTN: BP well controlled. Continue current regimen.   Pulmonary nodule:2.5 cmLULlung mass that is hypermetabolic on PET scan suspicious for bronchogenic carcinoma. Plan forbronchoscopy and mediastinoscopy to assess the mediastinal lymph nodes for microscopic metastasis. Dr. Vivi Martens office will reach out to patient to have this set up.   Medication Adjustments/Labs and Tests Ordered: Current medicines are reviewed at length with the patient today.  Concerns regarding medicines are outlined above.  Medication changes, Labs and Tests ordered today are listed in the Patient Instructions below. Patient Instructions  Medication Instructions:  Your physician recommends that you continue on your current medications as directed. Please refer to the Current Medication list given to you today.  Your physician discussed the importance of taking an antibiotic prior to any dental, gastrointestinal, genitourinary procedures to  prevent damage to the heart valves from infection. You were given a prescription for an antibiotic based on current SBE prophylaxis guidelines.  A prescription has been sent in for amoxicillin  If you need a refill on your cardiac medications before your next appointment, please call your pharmacy.   Lab work: None Ordered  If you have labs (blood work) drawn today and your tests are completely normal, you will receive your results only by:  [place] (if you have MyChart) OR  A paper copy in the mail If you have any lab test that is abnormal or we need to change your treatment, we will call you to review the results.  Testing/Procedures: Keep appointment for echocardiogram on 12/28/18 at 11:15 AM  Follow-Up:  Keep VIRTUAL Visit owith Nell Range, PA on 12/29/18 at 3:00 PM  Any Other Special Instructions Will Be Listed Below (If Applicable).       Signed, Angelena Form, PA-C  12/14/2018 1:33 PM    [facility] [address], [address] [place] [address], [place]  [address] Phone: [phone]; Fax: [phone]

## 2018-12-13 ENCOUNTER — Telehealth: Payer: Self-pay | Admitting: Physician Assistant

## 2018-12-13 NOTE — Telephone Encounter (Signed)

## 2018-12-14 ENCOUNTER — Ambulatory Visit (INDEPENDENT_AMBULATORY_CARE_PROVIDER_SITE_OTHER): Payer: Medicare Other | Admitting: Physician Assistant

## 2018-12-14 ENCOUNTER — Encounter: Payer: Self-pay | Admitting: Physician Assistant

## 2018-12-14 ENCOUNTER — Other Ambulatory Visit: Payer: Self-pay

## 2018-12-14 VITALS — BP 122/82 | HR 97 | Ht 69.0 in | Wt 179.8 lb

## 2018-12-14 DIAGNOSIS — I4811 Longstanding persistent atrial fibrillation: Secondary | ICD-10-CM

## 2018-12-14 DIAGNOSIS — Z952 Presence of prosthetic heart valve: Secondary | ICD-10-CM

## 2018-12-14 DIAGNOSIS — I10 Essential (primary) hypertension: Secondary | ICD-10-CM

## 2018-12-14 DIAGNOSIS — R911 Solitary pulmonary nodule: Secondary | ICD-10-CM | POA: Diagnosis not present

## 2018-12-14 MED ORDER — AMOXICILLIN 500 MG PO CAPS: ORAL_CAPSULE | ORAL | 3 refills | Status: DC

## 2018-12-14 NOTE — Patient Instructions (Addendum)
Medication Instructions:  Your physician recommends that you continue on your current medications as directed. Please refer to the Current Medication list given to you today.  Your physician discussed the importance of taking an antibiotic prior to any dental, gastrointestinal, genitourinary procedures to prevent damage to the heart valves from infection. You were given a prescription for an antibiotic based on current SBE prophylaxis guidelines.  A prescription has been sent in for amoxicillin  If you need a refill on your cardiac medications before your next appointment, please call your pharmacy.   Lab work: None Ordered  If you have labs (blood work) drawn today and your tests are completely normal, you will receive your results only by: [place] MyChart Message (if you have MyChart) OR . A paper copy in the mail If you have any lab test that is abnormal or we need to change your treatment, we will call you to review the results.  Testing/Procedures: Keep appointment for echocardiogram on 12/28/18 at 11:15 AM  Follow-Up: . Keep VIRTUAL Visit owith Nell Range, PA on 12/29/18 at 3:00 PM  Any Other Special Instructions Will Be Listed Below (If Applicable).

## 2018-12-15 ENCOUNTER — Telehealth (HOSPITAL_COMMUNITY): Payer: Self-pay

## 2018-12-15 ENCOUNTER — Encounter: Payer: Self-pay | Admitting: *Deleted

## 2018-12-15 ENCOUNTER — Other Ambulatory Visit: Payer: Self-pay | Admitting: *Deleted

## 2018-12-15 DIAGNOSIS — R911 Solitary pulmonary nodule: Secondary | ICD-10-CM

## 2018-12-15 NOTE — Progress Notes (Signed)
OK to contact Dr. Cyndia Bent to discuss timing of bronchososcopy/mediastinoscopy. See Katie's note. Will send to him.   Candee Furbish, MD

## 2018-12-22 ENCOUNTER — Encounter: Payer: Self-pay | Admitting: *Deleted

## 2018-12-27 NOTE — Progress Notes (Deleted)
[facility]ENTER   MULTIDISCIPLINARY HEART VALVE TEAM  {Choose 1 Note Type (Telehealth Visit or Telephone Visit):[phone]}   Evaluation Performed:  Follow-up visit  Date:  12/27/2018   ID:  Vincent Velez, DOB 1940/09/12, MRN [id number]  Patient Location: Home Provider Location: Office  PCP:  [name], MD  Cardiologist:  Candee Furbish, MD / Dr. Achilles Dunk)  Chief Complaint:  1 month s/p TAVR   History of Present Illness:    Vincent Velez is a 78 y.o. 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  He had a history ofmoderate aortic stenosis diagnosed by echocardiogram in May 2017 with a mean gradient was 25 mmHg. The patientdevelopedprogressive exertional fatigue and shortness of breath over the past several months and a follow-up echocardiogram on 08/04/2018 showed progression to severe aortic stenosis with a mean gradient of 41 mmHg, a peak gradient of 61 mmHg, and a dimensionless index of 0.19. Left ventricular ejection fraction was mildly reduced at 45 to 50% with mild left ventricular dilatation.Cardiac catheterization on 10/26/2018 showed mild nonobstructive coronary disease.  Unfortunately, pre TAVR chest CT showeda 2.4 x 2.3 cm macrolobulated and slightly spiculated lung mass in the anterior aspect of the lingula suspicious for lung cancer. SubsequentPET scan showedthe left upper lobe nodule to be hypermetabolic consistent with bronchogenic carcinoma. There were borderline enlarged lymph nodes in the anterior mediastinum that were not diagnostic of metastatic carcinoma. Plan was made for TAVR followed bybronchoscopy and mediastinoscopy to assess  the mediastinal lymph nodes for microscopic metastasis and if negative consider resection of the left upper lobe lung mass by wedge resection or lobectomy depending on his pulmonary function test.  He underwent successful TAVR with a36mm Edwards Sapien 3 THV via the TF approach on 7/72020. Post operative echoshowed EF 50-55%, normally functioning TAVRwith mean gradient 19 mmHg and mild PVL. He was discharged on home Xarelto and aspirin.   Today he presents to clinic for follow up.     Past Medical History:  Diagnosis Date   AAA (abdominal aortic aneurysm) ([place])    Anxiety    Arthritis    Asthma    when younger   Atrial fibrillation (HCC)    CAD (coronary artery disease)    Cataract    removed both   COPD (chronic obstructive pulmonary disease) (HCC)    Diabetes mellitus    Esophageal reflux    Family history of malignant neoplasm of gastrointestinal tract    Hiatal hernia    Hyperlipemia    Hypertension    Lung nodule    a. PET scan highly suspicious for malignancy. Bronch with biopsy to be done after TAVR   Malignant neoplasm of prostate [facility])    prostate    Peripheral vascular disease ([place])    Stricture and stenosis of esophagus    Past Surgical History:  Procedure Laterality Date   ABDOMINAL AORTA STENT     CARDIAC CATHETERIZATION     CARDIOVERSION N/A 12/04/2015   Procedure: CARDIOVERSION;  Surgeon: Sanda Klein, MD;  Location: [place] [facility];  Service: Cardiovascular;  Laterality: N/A;   COLONOSCOPY     FINGER SURGERY     INSERTION PROSTATE RADIATION SEED     RIGHT/LEFT HEART CATH AND CORONARY ANGIOGRAPHY N/A 10/26/2018   Procedure:  RIGHT/LEFT HEART CATH AND CORONARY ANGIOGRAPHY;  Surgeon: Burnell Blanks, MD;  Location: [place] [facility];  Service: Cardiovascular;  Laterality: N/A;   TEE WITHOUT CARDIOVERSION N/A 12/06/2018   Procedure: TRANSESOPHAGEAL ECHOCARDIOGRAM (TEE);  Surgeon: Burnell Blanks, MD;  Location: [place] [facility];  Service: Open Heart Surgery;  Laterality: N/A;   TRANSCATHETER AORTIC VALVE REPLACEMENT, TRANSFEMORAL N/A 12/06/2018   Procedure: TRANSCATHETER AORTIC VALVE REPLACEMENT, TRANSFEMORAL;  Surgeon: Burnell Blanks, MD;  Location: [place] [facility];  Service: Open Heart Surgery;  Laterality: N/A;   UPPER GASTROINTESTINAL ENDOSCOPY       No outpatient medications have been marked as taking for the 12/29/18 encounter (Appointment) with Eileen Stanford, PA-C.     Allergies:   Patient has no known allergies.   Social History   Tobacco Use   Smoking status: Light Tobacco Smoker    Packs/day: 1.00    Types: Pipe, Cigars   Smokeless tobacco: Never Used  Substance Use Topics   Alcohol use: No    Alcohol/week: 0.0 standard drinks   Drug use: No     Family Hx: The patient's family history includes Colon cancer in his father; Deep vein thrombosis in his mother; Diabetes in his mother; Heart attack in his brother; Heart disease in his mother; Hyperlipidemia in his mother; Hypertension in his brother and mother; Prostate cancer in his brother; Varicose Veins in his mother. There is no history of Colon polyps, Esophageal cancer, Rectal cancer, or Stomach cancer.  ROS:   Please see the history of present illness.    All other systems reviewed and are negative.   Prior CV studies:   The following studies were reviewed today:  TAVR OPERATIVE NOTE   Date of Procedure:12/06/2018  Preoperative Diagnosis:Severe Aortic Stenosis   Postoperative Diagnosis:Same   Procedure:   Transcatheter Aortic Valve Replacement - PercutaneousRightTransfemoral Approach Edwards Sapien 3 THV (size 54mm, model # 9600TFX, serial # [id number])  Co-Surgeons:Bryan Alveria Apley, MD and Lauree Chandler, MD   Anesthesiologist:R. Ola Spurr,  MD  Dala Dock, MD  Pre-operative Echo Findings: ? Severe aortic stenosis ? Mildleft ventricular systolic dysfunction  Post-operative Echo Findings: ? mildparavalvular leak ? Mild unchangedleft ventricular systolic dysfunction  _____________  Echo 12/07/18: IMPRESSIONS 1. The left ventricle has low normal systolic function, with an ejection fraction of 50-55%. The cavity size was normal. Left ventricular diastolic function could not be evaluated secondary to atrial fibrillation. No evidence of left ventricular  regional wall motion abnormalities. 2. The right ventricle has normal systolic function. The cavity was normal. There is no increase in right ventricular wall thickness. Right ventricular systolic pressure could not be assessed. 3. Left atrial size was severely dilated. 4. There is moderate dilatation of the ascending aorta measuring 44 mm. 5. The inferior vena cava was normal in size with <50% respiratory variability. 6. - TAVR: S/P Edwards Sapien 3 THV 48mm that appears to be functioning normally. There is mild perivalvular AI. The mean AVG is 84mmHg. AV Vmax: 264.60 cm/s. LVOT/AV VTI ratio: 0.50. AV Area (Vmax): 2.44 cm. 7. Compared to prior echo, a TAVR is now present.  _____________  Echo 12/28/18 ***   Labs/Other Tests and Data Reviewed:    EKG:  No ECG reviewed.  Recent Labs: 12/01/2018: ALT 15; B Natriuretic Peptide 72.4 12/07/2018: BUN 11; Creatinine, Ser 0.79; Hemoglobin 11.6; Magnesium 1.8; Platelets 127; Potassium 3.7; Sodium 136   Recent Lipid Panel Lab Results  Component Value Date/Time   CHOL 120 09/15/2013  02:28 PM   TRIG 110.0 09/15/2013 02:28 PM   HDL 37.70 (L) 09/15/2013 02:28 PM   CHOLHDL 3 09/15/2013 02:28 PM   LDLCALC 60 09/15/2013 02:28 PM    Wt Readings from Last 3 Encounters:  12/14/18 179 lb 12.8 oz (81.6 kg)  12/07/18 181 lb 10.5 oz (82.4 kg)  12/01/18 179 lb 8 oz (81.4 kg)      Objective:    Vital Signs:  There were no vitals taken for this visit.   Well nourished, well developed male in no*** acute distress.   ASSESSMENT & PLAN:    Severe AS s/pTAVR:   Persistent atrial fibrillation:   HTN:   Pulmonary nodule:2.5 cmLULlung mass that is hypermetabolic on PET scan suspicious for bronchogenic carcinoma. Plan forbronchoscopy and mediastinoscopy to assess the mediastinal lymph nodes for microscopic metastasis. This is set up for 8/5. Instructions from [place] office are given today   COVID-19 Education: The signs and symptoms of COVID-19 were discussed with the patient and how to seek care for testing (follow up with PCP or arrange E-visit).  The importance of social distancing was discussed today.  Time:   Today, I have spent *** minutes with the patient with telehealth technology discussing the above problems.     Medication Adjustments/Labs and Tests Ordered: Current medicines are reviewed at length with the patient today.  Concerns regarding medicines are outlined above.   Tests Ordered: No orders of the defined types were placed in this encounter.   Medication Changes: No orders of the defined types were placed in this encounter.   Disposition:  Follow up {follow up:15908}  Signed, Angelena Form, PA-C  12/27/2018 2:52 PM    [place]

## 2018-12-28 ENCOUNTER — Other Ambulatory Visit: Payer: Self-pay

## 2018-12-28 ENCOUNTER — Ambulatory Visit (HOSPITAL_COMMUNITY): Payer: Medicare Other | Attending: Internal Medicine

## 2018-12-28 ENCOUNTER — Telehealth: Payer: Self-pay | Admitting: Physician Assistant

## 2018-12-28 DIAGNOSIS — I350 Nonrheumatic aortic (valve) stenosis: Secondary | ICD-10-CM

## 2018-12-28 DIAGNOSIS — Z952 Presence of prosthetic heart valve: Secondary | ICD-10-CM | POA: Diagnosis not present

## 2018-12-28 NOTE — Telephone Encounter (Signed)
New Message     Left message to confirm appt and get consent for Virtual visit

## 2018-12-29 ENCOUNTER — Other Ambulatory Visit (HOSPITAL_COMMUNITY): Payer: Medicare Other

## 2018-12-29 ENCOUNTER — Ambulatory Visit: Payer: Medicare Other | Admitting: Physician Assistant

## 2018-12-29 ENCOUNTER — Telehealth: Payer: Self-pay | Admitting: *Deleted

## 2018-12-29 DIAGNOSIS — I480 Paroxysmal atrial fibrillation: Secondary | ICD-10-CM | POA: Diagnosis not present

## 2018-12-29 DIAGNOSIS — I10 Essential (primary) hypertension: Secondary | ICD-10-CM | POA: Diagnosis not present

## 2018-12-29 DIAGNOSIS — E119 Type 2 diabetes mellitus without complications: Secondary | ICD-10-CM | POA: Diagnosis not present

## 2018-12-29 DIAGNOSIS — C61 Malignant neoplasm of prostate: Secondary | ICD-10-CM | POA: Diagnosis not present

## 2018-12-29 DIAGNOSIS — H409 Unspecified glaucoma: Secondary | ICD-10-CM | POA: Diagnosis not present

## 2018-12-29 DIAGNOSIS — E139 Other specified diabetes mellitus without complications: Secondary | ICD-10-CM | POA: Diagnosis not present

## 2018-12-29 DIAGNOSIS — E7849 Other hyperlipidemia: Secondary | ICD-10-CM | POA: Diagnosis not present

## 2018-12-29 DIAGNOSIS — E7800 Pure hypercholesterolemia, unspecified: Secondary | ICD-10-CM | POA: Diagnosis not present

## 2018-12-29 DIAGNOSIS — E1169 Type 2 diabetes mellitus with other specified complication: Secondary | ICD-10-CM | POA: Diagnosis not present

## 2018-12-29 NOTE — Telephone Encounter (Signed)
Patient called the office returning Pat's call.

## 2018-12-29 NOTE — Telephone Encounter (Signed)
I spoke with pt and gave him appointment information for echo and appointment with K. Grandville Silos, [place]. I also went over most recent letter in Epic regarding surgery instructions with pt.  He has not received this letter in the mail yet.

## 2018-12-29 NOTE — Telephone Encounter (Signed)
Pt missed appointments today for echo and visit with K. Grandville Silos, [place].  He called scheduling and left new phone number.  Chart was updated with new number. Appointments have been rescheduled for August 19,2020.  Also need to confirm pt knows about upcoming bronchoscopy, pre-admission testing and COVID screening.  (see letter in chart and scheduled appointments).  I placed call to pt and left message to call office.

## 2018-12-30 DIAGNOSIS — R918 Other nonspecific abnormal finding of lung field: Secondary | ICD-10-CM | POA: Diagnosis not present

## 2018-12-30 DIAGNOSIS — E1169 Type 2 diabetes mellitus with other specified complication: Secondary | ICD-10-CM | POA: Diagnosis not present

## 2018-12-30 DIAGNOSIS — E7800 Pure hypercholesterolemia, unspecified: Secondary | ICD-10-CM | POA: Diagnosis not present

## 2018-12-30 DIAGNOSIS — F17210 Nicotine dependence, cigarettes, uncomplicated: Secondary | ICD-10-CM | POA: Diagnosis not present

## 2018-12-30 DIAGNOSIS — I10 Essential (primary) hypertension: Secondary | ICD-10-CM | POA: Diagnosis not present

## 2018-12-30 DIAGNOSIS — I480 Paroxysmal atrial fibrillation: Secondary | ICD-10-CM | POA: Diagnosis not present

## 2018-12-30 DIAGNOSIS — Z952 Presence of prosthetic heart valve: Secondary | ICD-10-CM | POA: Diagnosis not present

## 2018-12-30 NOTE — Progress Notes (Signed)
[facility] [address], [place] [address] Phone: [phone] Fax: [phone]      Your procedure is scheduled on August 5th.  Report to [facility] Main Entrance "A" at 6:30 A.M., and check in at the Admitting office.  Call this number if you have problems the morning of surgery:  [phone]  Call [phone] if you have any questions prior to your surgery date Monday-Friday 8am-4pm    Remember:  Do not eat or drink after midnight the night before your surgery   Take these medicines the morning of surgery with A SIP OF WATER   Eye drops - if needed  Esomeprazole (Nexium)  Metoprolol  Simvastatin  Follow your surgeon's instructions on when to stop Xarelto & Aspirin.  If no instructions were given by your surgeon then you will need to call the office to get those instructions.    7 days prior to surgery STOP taking any Aspirin (unless otherwise instructed by your surgeon), Aleve, Naproxen, Ibuprofen, Motrin, Advil, Goody's, BC's, all herbal medications, fish oil, and all vitamins.   WHAT DO I DO ABOUT MY DIABETES MEDICATION?   Marland Kitchen Do not take oral diabetes medicines (pills) the morning of surgery. - Metoprolol, Glimepiride (Amaryl)    How to Manage Your Diabetes Before and After Surgery  Why is it important to control my blood sugar before and after surgery? . Improving blood sugar levels before and after surgery helps healing and can limit problems. . A way of improving blood sugar control is eating a healthy diet by: o  Eating less sugar and carbohydrates o  Increasing activity/exercise o  Talking with your doctor about reaching your blood sugar goals . High blood sugars (greater than 180 mg/dL) can raise your risk of infections and slow your recovery, so you will need to focus on controlling your diabetes during the weeks before surgery. . Make sure that the doctor who takes care of your diabetes knows  about your planned surgery including the date and location.  How do I manage my blood sugar before surgery? . Check your blood sugar at least 4 times a day, starting 2 days before surgery, to make sure that the level is not too high or low. o Check your blood sugar the morning of your surgery when you wake up and every 2 hours until you get to the Short Stay unit. . If your blood sugar is less than 70 mg/dL, you will need to treat for low blood sugar: o Do not take insulin. o Treat a low blood sugar (less than 70 mg/dL) with  cup of clear juice (cranberry or apple), 4 glucose tablets, OR glucose gel. o Recheck blood sugar in 15 minutes after treatment (to make sure it is greater than 70 mg/dL). If your blood sugar is not greater than 70 mg/dL on recheck, call [phone] for further instructions. . Report your blood sugar to the short stay nurse when you get to Short Stay.  . If you are admitted to the hospital after surgery: o Your blood sugar will be checked by the staff and you will probably be given insulin after surgery (instead of oral diabetes medicines) to make sure you have good blood sugar levels. o The goal for blood sugar control after surgery is 80-180 mg/dL.    The Morning of Surgery  Do not wear jewelry.  Do not wear lotions, powders, colognes, or deodorant  Men may shave  face and neck.  Do not bring valuables to the hospital.  [facility] is not responsible for any belongings or valuables.  If you are a smoker, DO NOT Smoke 24 hours prior to surgery IF you wear a CPAP at night please bring your mask, tubing, and machine the morning of surgery   Remember that you must have someone to transport you home after your surgery, and remain with you for 24 hours if you are discharged the same day.   Contacts, glasses, hearing aids, dentures or bridgework may not be worn into surgery.    Leave your suitcase in the car.  After surgery it may be brought to your room.  For  patients admitted to the hospital, discharge time will be determined by your treatment team.  Patients discharged the day of surgery will not be allowed to drive home.    Special instructions:   [facility]- Preparing For Surgery  Before surgery, you can play an important role. Because skin is not sterile, your skin needs to be as free of germs as possible. You can reduce the number of germs on your skin by washing with CHG (chlorahexidine gluconate) Soap before surgery.  CHG is an antiseptic cleaner which kills germs and bonds with the skin to continue killing germs even after washing.    Oral Hygiene is also important to reduce your risk of infection.  Remember - BRUSH YOUR TEETH THE MORNING OF SURGERY WITH YOUR REGULAR TOOTHPASTE  Please do not use if you have an allergy to CHG or antibacterial soaps. If your skin becomes reddened/irritated stop using the CHG.  Do not shave (including legs and underarms) for at least 48 hours prior to first CHG shower. It is OK to shave your face.  Please follow these instructions carefully.   1. Shower the NIGHT BEFORE SURGERY and the MORNING OF SURGERY with CHG Soap.   2. If you chose to wash your hair, wash your hair first as usual with your normal shampoo.  3. After you shampoo, rinse your hair and body thoroughly to remove the shampoo.  4. Use CHG as you would any other liquid soap. You can apply CHG directly to the skin and wash gently with a scrungie or a clean washcloth.   5. Apply the CHG Soap to your body ONLY FROM THE NECK DOWN.  Do not use on open wounds or open sores. Avoid contact with your eyes, ears, mouth and genitals (private parts). Wash Face and genitals (private parts)  with your normal soap.   6. Wash thoroughly, paying special attention to the area where your surgery will be performed.  7. Thoroughly rinse your body with warm water from the neck down.  8. DO NOT shower/wash with your normal soap after using and rinsing off the  CHG Soap.  9. Pat yourself dry with a CLEAN TOWEL.  10. Wear CLEAN PAJAMAS to bed the night before surgery, wear comfortable clothes the morning of surgery  11. Place CLEAN SHEETS on your bed the night of your first shower and DO NOT SLEEP WITH PETS.    Day of Surgery:  Do not apply any deodorants/lotions. Please shower the morning of surgery with the CHG soap  Please wear clean clothes to the hospital/surgery center.   Remember to brush your teeth WITH YOUR REGULAR TOOTHPASTE.   Please read over the following fact sheets that you were given.

## 2019-01-02 ENCOUNTER — Inpatient Hospital Stay (HOSPITAL_COMMUNITY)
Admission: RE | Admit: 2019-01-02 | Discharge: 2019-01-02 | Disposition: A | Discharge status: Home / Self Care | Payer: Medicare Other | Source: Ambulatory Visit

## 2019-01-02 ENCOUNTER — Other Ambulatory Visit (HOSPITAL_COMMUNITY): Payer: Medicare Other

## 2019-01-03 ENCOUNTER — Other Ambulatory Visit (HOSPITAL_COMMUNITY): Payer: Medicare Other

## 2019-01-03 NOTE — Pre-Procedure Instructions (Addendum)
Vincent Velez  01/03/2019      [place], [place] [address] Phone: [phone] Fax: [phone]    Your procedure is scheduled on Fri., Aug. 7, 2020 from 7:31AM-9:30AM  Report to [facility] Entrance "A" at 5:30AM  Call this number if you have problems the morning of surgery:  [phone]   Remember:  Do not eat or drink after midnight on Aug. 6th    Take these medicines the morning of surgery with A SIP OF WATER: Dorzolamide (TRUSOPT) Esomeprazole (NEXIUM)  Metoprolol succinate (TOPROL-XL)  Follow your surgeon's instructions on when to stop Aspirin and Xarelto.  If no instructions were given by your surgeon then you will need to call the office to get those instructions.    As of today, stop taking all Aspirin (unless instructed by your doctor) and Other Aspirin containing products, Vitamins, Fish oils, and Herbal medications. Also stop all NSAIDS i.e. Advil, Ibuprofen, Motrin, Aleve, Anaprox, Naproxen, BC, Goody Powders, and all Supplements.   . Do not take Glimepiride (AMARYL) and MetFORMIN (GLUCOPHAGE-XR) the morning of surgery.  How to Manage Your Diabetes Before and After Surgery  Why is it important to control my blood sugar before and after surgery? . Improving blood sugar levels before and after surgery helps healing and can limit problems. . A way of improving blood sugar control is eating a healthy diet by: o  Eating less sugar and carbohydrates o  Increasing activity/exercise o  Talking with your doctor about reaching your blood sugar goals . High blood sugars (greater than 180 mg/dL) can raise your risk of infections and slow your recovery, so you will need to focus on controlling your diabetes during the weeks before surgery. . Make sure that the doctor who takes care of your diabetes knows about your planned surgery including the date and location.  How do I manage my  blood sugar before surgery? . Check your blood sugar at least 4 times a day, starting 2 days before surgery, to make sure that the level is not too high or low. o Check your blood sugar the morning of your surgery when you wake up and every 2 hours until you get to the Short Stay unit. . If your blood sugar is less than 70 mg/dL, you will need to treat for low blood sugar: o Do not take insulin. o Treat a low blood sugar (less than 70 mg/dL) with  cup of clear juice (cranberry or apple), 4 glucose tablets, OR glucose gel. Recheck blood sugar in 15 minutes after treatment (to make sure it is greater than 70 mg/dL). If your blood sugar is not greater than 70 mg/dL on recheck, call [phone] o  for further instructions.                          . If your CBG is greater than 220 mg/dL, inform the staff upon arrival to Short Stay.  . If you are admitted to the hospital after surgery: o Your blood sugar will be checked by the staff and you will probably be given insulin after surgery (instead of oral diabetes medicines) to make sure you have good blood sugar levels. o The goal for blood sugar control after surgery is 80-180 mg/dL.  Reviewed and Endorsed by [facility] Patient Education Committee, August 2015    Special instructions:  [facility]- Preparing  For Surgery  Before surgery, you can play an important role. Because skin is not sterile, your skin needs to be as free of germs as possible. You can reduce the number of germs on your skin by washing with CHG (chlorahexidine gluconate) Soap before surgery.  CHG is an antiseptic cleaner which kills germs and bonds with the skin to continue killing germs even after washing.    Please do not use if you have an allergy to CHG or antibacterial soaps. If your skin becomes reddened/irritated stop using the CHG.  Do not shave (including legs and underarms) for at least 48 hours prior to first CHG shower. It is OK to shave your face.  Please follow  these instructions carefully.   1. Shower the NIGHT BEFORE SURGERY and the MORNING OF SURGERY with CHG.   2. If you chose to wash your hair, wash your hair first as usual with your normal shampoo.  3. After you shampoo, rinse your hair and body thoroughly to remove the shampoo.  4. Use CHG as you would any other liquid soap. You can apply CHG directly to the skin and wash gently with a scrungie or a clean washcloth.   5. Apply the CHG Soap to your body ONLY FROM THE NECK DOWN.  Do not use on open wounds or open sores. Avoid contact with your eyes, ears, mouth and genitals (private parts). Wash Face and genitals (private parts)  with your normal soap.  6. Wash thoroughly, paying special attention to the area where your surgery will be performed.  7. Thoroughly rinse your body with warm water from the neck down.  8. DO NOT shower/wash with your normal soap after using and rinsing off the CHG Soap.  9. Pat yourself dry with a CLEAN TOWEL.  10. Wear CLEAN PAJAMAS to bed the night before surgery, wear comfortable clothes the morning of surgery  11. Place CLEAN SHEETS on your bed the night of your first shower and DO NOT SLEEP WITH PETS.   Day of Surgery:             Remember to brush your teeth WITH YOUR REGULAR TOOTHPASTE.   Do not wear jewelry.  Do not wear lotions, powders, colognes, or deodorant.  Do not shave 48 hours prior to surgery.  Men may shave face.  Do not bring valuables to the hospital.  [facility] is not responsible for any belongings or valuables.  Contacts, dentures or bridgework may not be worn into surgery.    For patients admitted to the hospital, discharge time will be determined by your treatment team.  Patients discharged the day of surgery will not be allowed to drive home.  Please wear clean clothes to the hospital/surgery center.     Please read over the following fact sheets that you were given. Pain Booklet, Coughing and Deep Breathing, MRSA  Information and Surgical Site Infection Prevention

## 2019-01-04 ENCOUNTER — Encounter (HOSPITAL_COMMUNITY)
Admission: RE | Admit: 2019-01-04 | Discharge: 2019-01-04 | Disposition: A | Discharge status: Home / Self Care | Payer: Medicare Other | Source: Ambulatory Visit | Attending: Surgery | Admitting: Surgery

## 2019-01-04 ENCOUNTER — Encounter (HOSPITAL_COMMUNITY): Payer: Self-pay

## 2019-01-04 ENCOUNTER — Other Ambulatory Visit: Payer: Self-pay

## 2019-01-04 ENCOUNTER — Other Ambulatory Visit (HOSPITAL_COMMUNITY)
Admission: RE | Admit: 2019-01-04 | Discharge: 2019-01-04 | Disposition: A | Discharge status: Home / Self Care | Payer: Medicare Other | Source: Ambulatory Visit | Attending: Surgery | Admitting: Surgery

## 2019-01-04 ENCOUNTER — Ambulatory Visit (HOSPITAL_COMMUNITY)
Admission: RE | Admit: 2019-01-04 | Discharge: 2019-01-04 | Disposition: A | Discharge status: Home / Self Care | Payer: Medicare Other | Source: Ambulatory Visit | Attending: Surgery | Admitting: Surgery

## 2019-01-04 DIAGNOSIS — R911 Solitary pulmonary nodule: Secondary | ICD-10-CM

## 2019-01-04 DIAGNOSIS — I700 Atherosclerosis of aorta: Secondary | ICD-10-CM | POA: Insufficient documentation

## 2019-01-04 DIAGNOSIS — F17290 Nicotine dependence, other tobacco product, uncomplicated: Secondary | ICD-10-CM | POA: Insufficient documentation

## 2019-01-04 DIAGNOSIS — Z01818 Encounter for other preprocedural examination: Secondary | ICD-10-CM | POA: Diagnosis not present

## 2019-01-04 DIAGNOSIS — Z20828 Contact with and (suspected) exposure to other viral communicable diseases: Secondary | ICD-10-CM | POA: Insufficient documentation

## 2019-01-04 LAB — COMPREHENSIVE METABOLIC PANEL
ALT: 15 U/L (ref 0–44)
AST: 20 U/L (ref 15–41)
Albumin: 4 g/dL (ref 3.5–5.0)
Alkaline Phosphatase: 67 U/L (ref 38–126)
Anion gap: 10 (ref 5–15)
BUN: 11 mg/dL (ref 8–23)
CO2: 22 mmol/L (ref 22–32)
Calcium: 9.4 mg/dL (ref 8.9–10.3)
Chloride: 106 mmol/L (ref 98–111)
Creatinine, Ser: 0.84 mg/dL (ref 0.61–1.24)
GFR calc Af Amer: >60 mL/min (ref >60)
GFR calc non Af Amer: >60 mL/min (ref >60)
Glucose, Bld: 199 mg/dL — ABNORMAL HIGH (ref 70–99)
Potassium: 4.1 mmol/L (ref 3.5–5.1)
Sodium: 138 mmol/L (ref 135–145)
Total Bilirubin: 0.7 mg/dL (ref 0.3–1.2)
Total Protein: 7 g/dL (ref 6.5–8.1)

## 2019-01-04 LAB — CBC
HCT: 42.4 % (ref 39.0–52.0)
Hemoglobin: 13.5 g/dL (ref 13.0–17.0)
MCH: 30.1 pg (ref 26.0–34.0)
MCHC: 31.8 g/dL (ref 30.0–36.0)
MCV: 94.6 fL (ref 80.0–100.0)
Platelets: 159 10*3/uL (ref 150–400)
RBC: 4.48 MIL/uL (ref 4.22–5.81)
RDW: 13.7 % (ref 11.5–15.5)
WBC: 7.8 10*3/uL (ref 4.0–10.5)
nRBC: 0 % (ref 0.0–0.2)

## 2019-01-04 LAB — APTT: aPTT: 34 seconds (ref 24–36)

## 2019-01-04 LAB — SARS CORONAVIRUS 2 (TAT 6-24 HRS): SARS Coronavirus 2: NEGATIVE

## 2019-01-04 LAB — TYPE AND SCREEN
ABO/RH(D): O POS
Antibody Screen: NEGATIVE

## 2019-01-04 LAB — PROTIME-INR
INR: 2 — ABNORMAL HIGH (ref 0.8–1.2)
Prothrombin Time: 22.3 seconds — ABNORMAL HIGH (ref 11.4–15.2)

## 2019-01-04 LAB — SURGICAL PCR SCREEN
MRSA, PCR: NEGATIVE
Staphylococcus aureus: POSITIVE — AB

## 2019-01-04 IMAGING — CR CHEST - 2 VIEW
2 series · 2 of 2 positions shown · non-contrast
Comparison: Single-view of the chest [DATE]. PET CT scan
[DATE].

CLINICAL DATA: Pre-admission examination for patient with a left
lung nodule.

EXAM:
CHEST - 2 VIEW

[w chest pa]
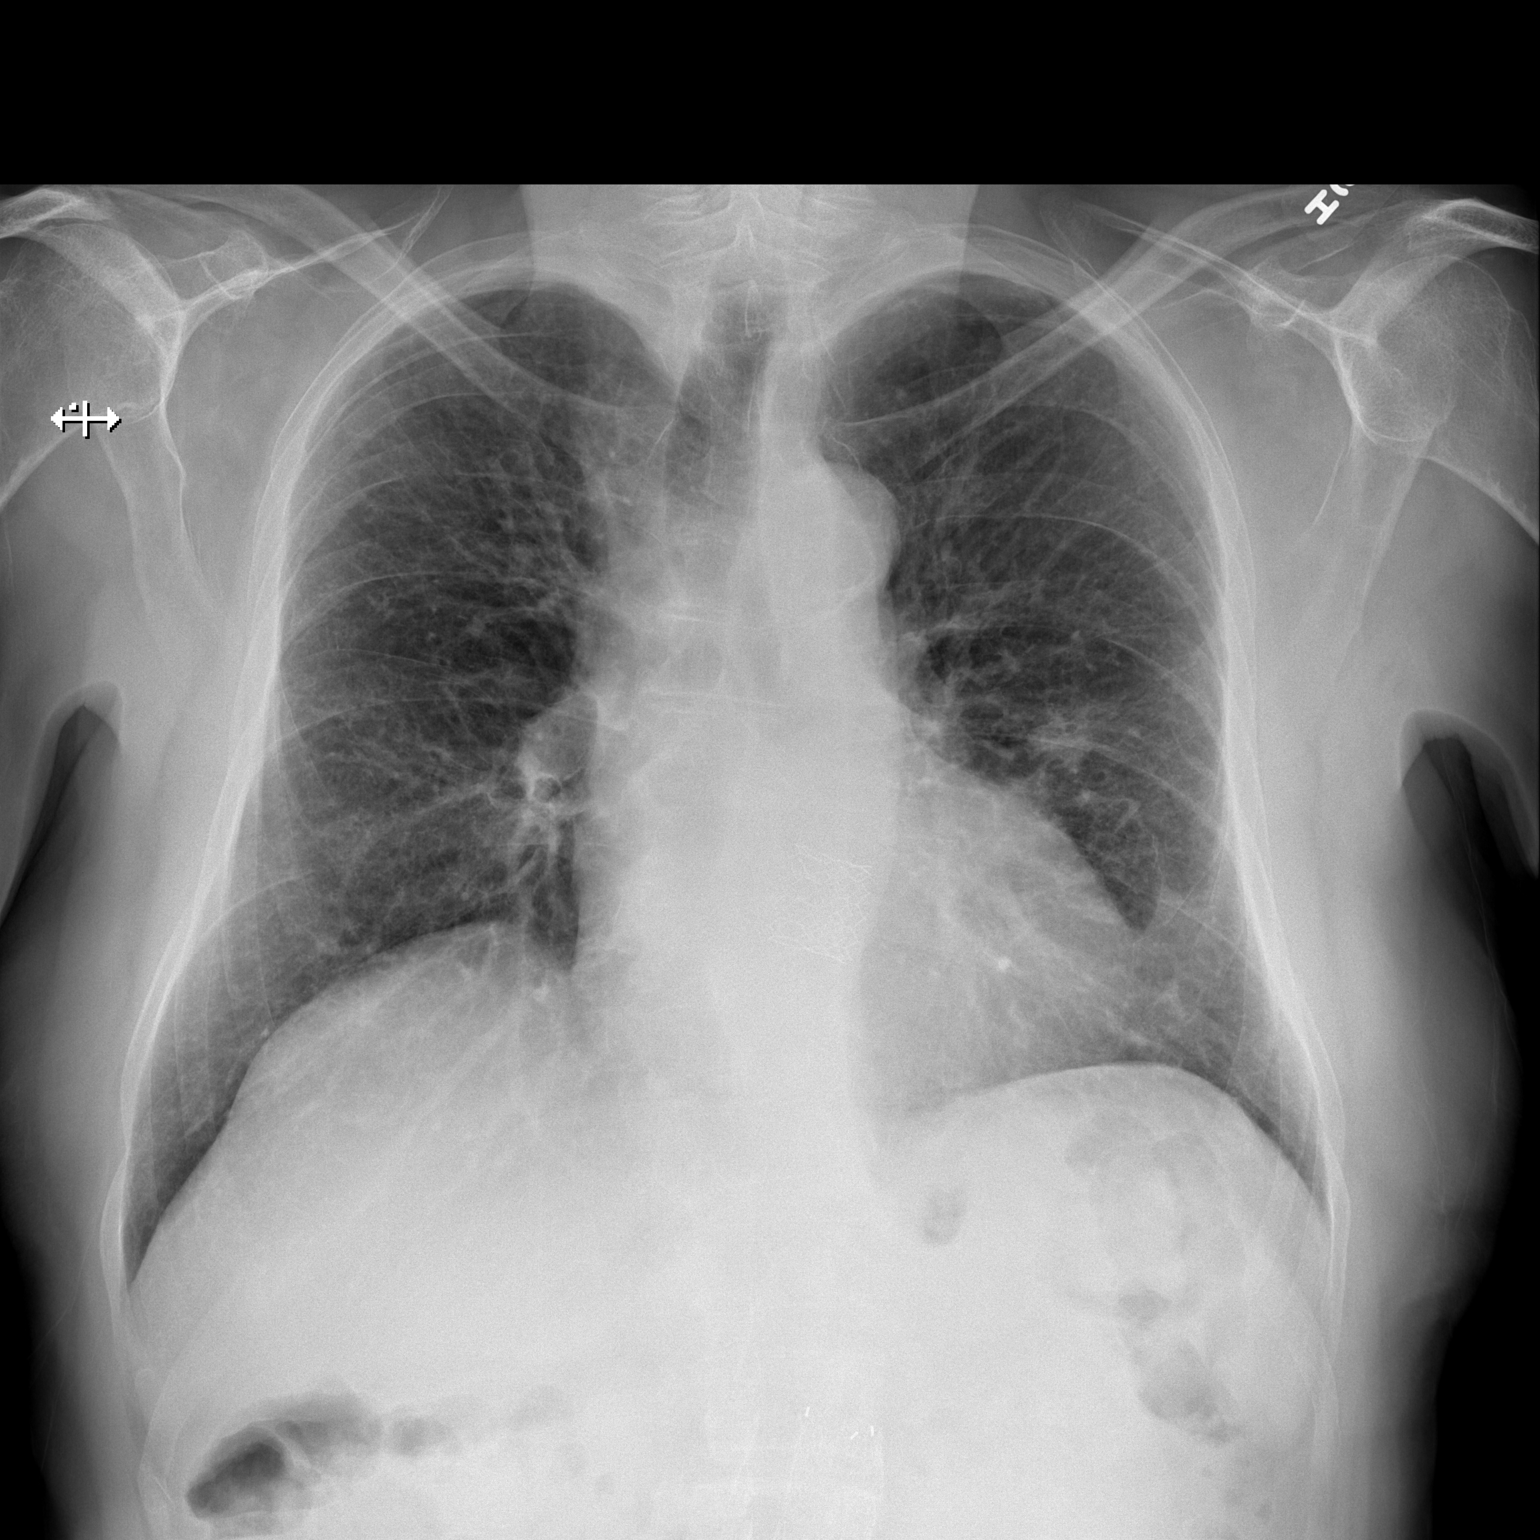

[w chest lat]
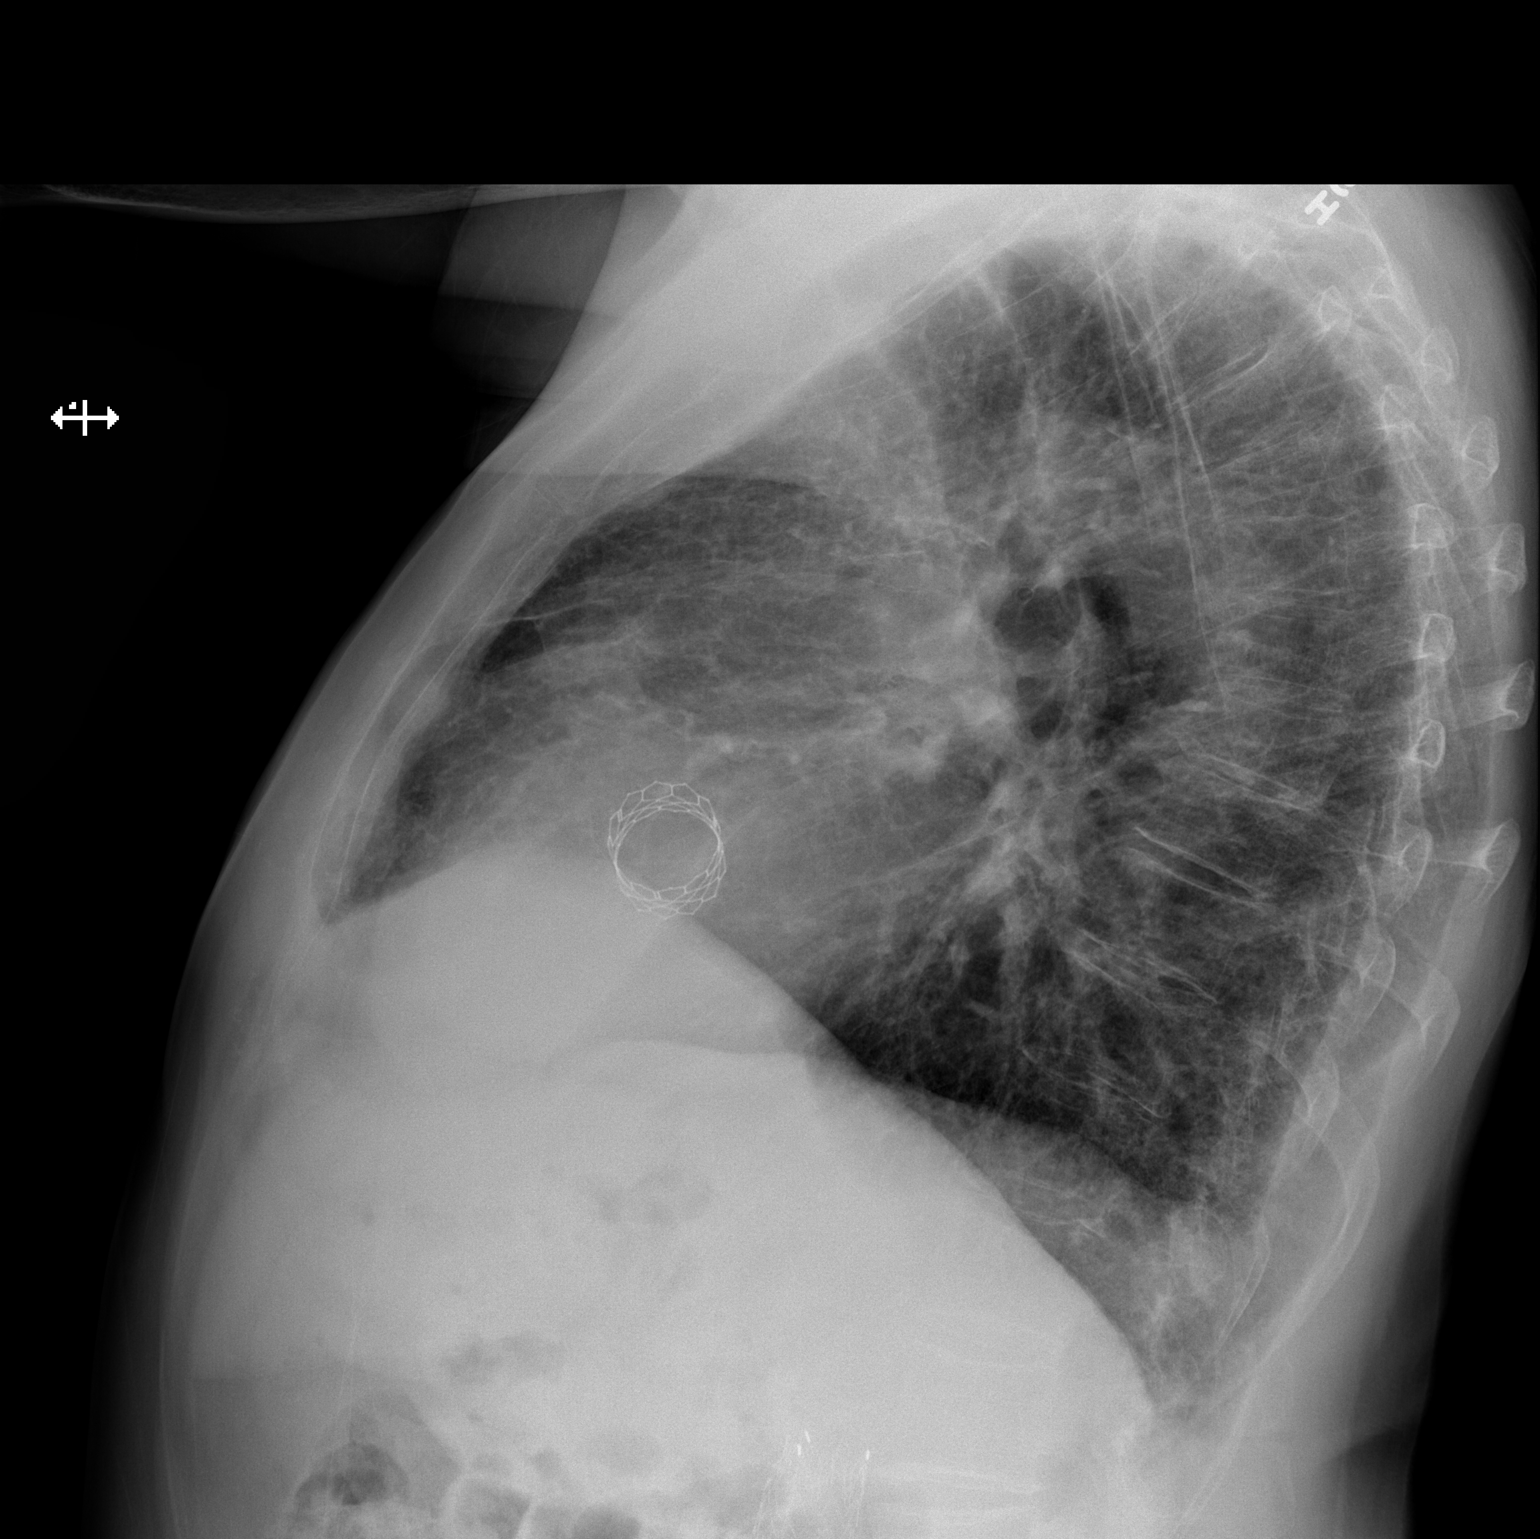

[2 of 2 positions shown; findings below may reference images not displayed]

FINDINGS: Left upper lobe pulmonary nodule is identified. Lungs are otherwise
clear. Heart size is upper normal. Aortic atherosclerosis. The
patient is status post aortic valve replacement. No pneumothorax or
pleural fluid. No acute or focal bony abnormality.
IMPRESSION: No acute disease.

Left upper lobe pulmonary nodule.

Atherosclerosis.

## 2019-01-04 NOTE — Progress Notes (Addendum)
PCP - Dr. Delfina Redwood  Cardiologist - Dr. Marlou Porch  Chest x-ray - 01/04/2019  EKG - 12/14/2018 (E)  Stress Test - 12/31/15 (E)  ECHO - 12/28/2018 (E)  Cardiac Cath - 10/26/2018 (E)  AICD-na PM-na LOOP-na  Sleep Study - Denies CPAP - None  LABS- 01/04/2019: CBC, CMP, PT, PTT, T/S, PCR             01/04/2019: [facility]   ASA- Cont. Xarelto- LD- 8/4  ERAS- No  HA1C- 12/01/2018: 7.6 (E) Fasting Blood Sugar - 103-150, today 223- Pt sts he had no taken his medicines for today, yet. Checks Blood Sugar ___2__ times a day  Anesthesia- Yes- cardiac history.  Pt denies having chest pain, sob, or fever at this time. All instructions explained to the pt, with a verbal understanding of the material. Pt agrees to go over the instructions while at home for a better understanding. Pt also instructed to self quarantine after being tested for COVID-19. The opportunity to ask questions was provided.   Coronavirus Screening  Have you experienced the following symptoms:  Cough yes/no: No Fever (>100.19F)  yes/no: No Runny nose yes/no: No Sore throat yes/no: No Difficulty breathing/shortness of breath  yes/no: No  Have you or a family member traveled in the last 14 days and where? yes/no: No   If the patient indicates "YES" to the above questions, their PAT will be rescheduled to limit the exposure to others and, the surgeon will be notified. THE PATIENT WILL NEED TO BE ASYMPTOMATIC FOR 14 DAYS.   If the patient is not experiencing any of these symptoms, the PAT nurse will instruct them to NOT bring anyone with them to their appointment since they may have these symptoms or traveled as well.   Please remind your patients and families that hospital visitation restrictions are in effect and the importance of the restrictions.

## 2019-01-05 LAB — GLUCOSE, CAPILLARY: Glucose-Capillary: 223 mg/dL — ABNORMAL HIGH (ref 70–99)

## 2019-01-05 NOTE — H&P (Signed)
[address]       [place] [address]             [phone]      Cardiothoracic Surgery Admission History and Physical    Referring Provider is Jerline Pain, MD  Primary Cardiologist is Candee Furbish, MD  PCP is [name], MD      Chief Complaint  Patient presents with  . Left upper lobe lung nodule      HPI:  The patient is a 78 year old gentleman with a history of hypertension, hyperlipidemia, diabetes, ongoing smoking with COPD, persistent atrial fibrillation on anticoagulation, abdominal aortic aneurysm s/p EVAR and moderate aortic stenosis that was successfully treated with TAVR on 12/06/2018. The preoperative PET scan shows the left upper lobe nodule to be hypermetabolic consistent with bronchogenic carcinoma. There are borderline enlarged lymph nodes in the anterior mediastinum and aortopulmonary window which have low-grade hypermetabolic activity which is similar to or below the blood pool activity and therefore not diagnostic of metastatic carcinoma.      Past Medical History:  Diagnosis Date  . AAA (abdominal aortic aneurysm) ([place])   . Allergy, unspecified not elsewhere classified   . Anxiety   . Arthritis   . Arthropathy, unspecified, site unspecified   . Asthma    when younger  . Atrial fibrillation ([place])   . CAD (coronary artery disease)   . Cataract    removed both  . COPD (chronic obstructive pulmonary disease) ([place])   . Diabetes mellitus   . Esophageal reflux   . Family history of malignant neoplasm of gastrointestinal tract   . Hiatal hernia   . Hyperlipemia   . Hypertension   . Malignant neoplasm of prostate ([place])    prostate   . Peripheral vascular disease ([place])   . Stricture and stenosis of esophagus         Past Surgical History:  Procedure Laterality Date  . ABDOMINAL AORTA STENT    . CARDIOVERSION N/A 12/04/2015   Procedure: CARDIOVERSION; Surgeon: Sanda Klein, MD; Location: [facility]; Service: Cardiovascular;  Laterality: N/A;  . COLONOSCOPY    . FINGER SURGERY    . INSERTION PROSTATE RADIATION SEED    . RIGHT/LEFT HEART CATH AND CORONARY ANGIOGRAPHY N/A 10/26/2018   Procedure: RIGHT/LEFT HEART CATH AND CORONARY ANGIOGRAPHY; Surgeon: Burnell Blanks, MD; Location: [facility]; Service: Cardiovascular; Laterality: N/A;  . UPPER GASTROINTESTINAL ENDOSCOPY    S/p TAVR using a 26 mm Edwards Sapien 3 THV on 12/06/2018       Family History  Problem Relation Age of Onset  . Colon cancer Father    questionable  . Diabetes Mother   . Heart disease Mother    Heart Disease before age 11  . Deep vein thrombosis Mother   . Hyperlipidemia Mother   . Hypertension Mother   . Varicose Veins Mother   . Hypertension Brother   . Heart attack Brother   . Prostate cancer Brother   . Colon polyps Neg Hx   . Esophageal cancer Neg Hx   . Rectal cancer Neg Hx   . Stomach cancer Neg Hx    Social History        Socioeconomic History  . Marital status: Married    Spouse name: Not on file  . Number of children: 2  . Years of education: Not on file  . Highest education level: Not on file  Occupational History  . Occupation: engineer-retired  Employer: [employer]  Social Needs  . Financial resource strain: Not on file  . Food insecurity:    Worry: Not on file    Inability: Not on file  . Transportation needs:    Medical: Not on file    Non-medical: Not on file  Tobacco Use  . Smoking status: Light Tobacco Smoker    Types: Pipe, Cigars  . Smokeless tobacco: Never Used  Substance and Sexual Activity  . Alcohol use: No    Alcohol/week: 0.0 standard drinks  . Drug use: No  . Sexual activity: Not on file  Lifestyle  . Physical activity:    Days per week: Not on file    Minutes per session: Not on file  . Stress: Not on file  Relationships  . Social connections:    Talks on phone: Not on file    Gets together: Not on file    Attends religious service: Not on file    Active member  of club or organization: Not on file    Attends meetings of clubs or organizations: Not on file    Relationship status: Not on file  . Intimate partner violence:    Fear of current or ex partner: Not on file    Emotionally abused: Not on file    Physically abused: Not on file    Forced sexual activity: Not on file  Other Topics Concern  . Not on file  Social History Narrative  . Not on file         Current Outpatient Medications  Medication Sig Dispense Refill  . dorzolamide (TRUSOPT) 2 % ophthalmic solution Place 1 drop into both eyes 3 (three) times daily.    Marland Kitchen esomeprazole (NEXIUM) 40 MG capsule Take 40 mg by mouth daily.     Marland Kitchen glimepiride (AMARYL) 2 MG tablet Take 2 mg by mouth daily.    Marland Kitchen ibuprofen (ADVIL,MOTRIN) 200 MG tablet Take 200 mg by mouth daily as needed for headache or moderate pain.    . metFORMIN (GLUCOPHAGE-XR) 500 MG 24 hr tablet Take 1,000 mg by mouth every evening.     . metoprolol succinate (TOPROL-XL) 25 MG 24 hr tablet Take 1 tablet (25 mg total) by mouth daily.    . simvastatin (ZOCOR) 40 MG tablet Take 40 mg by mouth at bedtime.     Alveda Reasons 20 MG TABS tablet TAKE 1 TABLET BY MOUTH DAILY WITH SUPPER (Patient taking differently: Take 20 mg by mouth daily with supper. ) 90 tablet 1            Current Facility-Administered Medications  Medication Dose Route Frequency Provider Last Rate Last Dose  . 0.9 % sodium chloride infusion 500 mL Intravenous Continuous Irene Shipper, MD    No Known Allergies   Review of Systems:   General: Normal appetite,  no decreased energy, no weight gain, no weight loss, no fever  Cardiac: no chest pain with exertion, no chest pain at rest, +SOB with exertion improved since TAVR, no resting SOB, no PND, no orthopnea, no palpitations, + arrhythmia, + atrial fibrillation, no LE edema, no dizziness, no syncope  Respiratory: + shortness of breath improved after TAVR, no home oxygen, no productive cough, no dry cough, no bronchitis,  no wheezing, no hemoptysis, no asthma, no pain with inspiration or cough, no sleep apnea, no CPAP at night  GI: no difficulty swallowing, no reflux, no frequent heartburn, no hiatal hernia, no abdominal pain, no constipation, no diarrhea, no hematochezia, no  hematemesis, no melena  GU: no dysuria, no frequency, no urinary tract infection, no hematuria, no enlarged prostate, no kidney stones, no kidney disease  Vascular: no pain suggestive of claudication, no pain in feet, no leg cramps, no varicose veins, no DVT, no non-healing foot ulcer  Neuro: no stroke, no TIA's, no seizures, no headaches, no temporary blindness one eye, no slurred speech, no peripheral neuropathy, no chronic pain, no instability of gait, no memory/cognitive dysfunction  Musculoskeletal: no arthritis, no joint swelling, no myalgias, no difficulty walking, norma mobility  Skin: no rash, no itching, no skin infections, no pressure sores or ulcerations  Psych: no anxiety, no depression, no nervousness, no unusual recent stress  Eyes: no blurry vision, no floaters, no recent vision changes, + wears glasses or contacts  ENT: no hearing loss, no loose or painful teeth, no dentures  Hematologic: no easy bruising, no abnormal bleeding, no clotting disorder, no frequent epistaxis  Endocrine: + diabetes, does check CBG's at home    Physical Exam:   96% Comment: ON RA  BMI 27.76 kg/m  General: Elderly but well-appearing  HEENT: Unremarkable, NCAT, PERLA, EOMI  Neck: no JVD, no bruits, no adenopathy or thyromegaly  Chest: clear to auscultation, symmetrical breath sounds, no wheezes, no rhonchi  CV: RRR, no murmur heard  Abdomen: soft, non-tender, no masses  Extremities: warm, well-perfused, pulses palpable, no LE edema  Rectal/GU Deferred  Neuro: Grossly non-focal and symmetrical throughout  Skin: Clean and dry, no rashes, no breakdown   Diagnostic Tests:    CLINICAL DATA: 78 year old male with history of severe aortic   stenosis. Preprocedural study prior to potential transcatheter  aortic valve replacement (TAVR) procedure.  EXAM:  CT ANGIOGRAPHY CHEST, ABDOMEN AND PELVIS  TECHNIQUE:  Multidetector CT imaging through the chest, abdomen and pelvis was  performed using the standard protocol during bolus administration of  intravenous contrast. Multiplanar reconstructed images and MIPs were  obtained and reviewed to evaluate the vascular anatomy.  CONTRAST: 12m OMNIPAQUE IOHEXOL 350 MG/ML SOLN  COMPARISON: No prior chest CT. CT of the abdomen and pelvis  05/08/2010.  FINDINGS:  CTA CHEST FINDINGS  Cardiovascular: Heart size is moderately enlarged. There is no  significant pericardial fluid, thickening or pericardial  calcification. There is aortic atherosclerosis, as well as  atherosclerosis of the great vessels of the mediastinum and the  coronary arteries, including calcified atherosclerotic plaque in the  left main, left anterior descending, left circumflex and right  coronary arteries. Ectasia of the ascending thoracic aorta (4.2 cm  in diameter). Severe thickening calcification of the aortic valve.  Mediastinum/Lymph Nodes: No pathologically enlarged mediastinal or  hilar lymph nodes. Esophagus is unremarkable in appearance. No  axillary lymphadenopathy.  Lungs/Pleura: Irregular-shaped nodule in the anterior aspect of the  lingula (axial image 44 of series 15 and coronal image 48 of series  16) measuring 2.4 x 2.3 x 1.4 cm, with macrolobulated and slightly  spiculated margins, concerning for potential neoplasm. No acute  consolidative airspace disease. No pleural effusions.  Musculoskeletal/Soft Tissues: There are no aggressive appearing  lytic or blastic lesions noted in the visualized portions of the  skeleton.  CTA ABDOMEN AND PELVIS FINDINGS  Hepatobiliary: No suspicious cystic or solid hepatic lesions. No  intra or extrahepatic biliary ductal dilatation. Gallbladder is  normal in  appearance.  Pancreas: No pancreatic mass. No pancreatic ductal dilatation. No  pancreatic or peripancreatic fluid or inflammatory changes.  Spleen: Unremarkable.  Adrenals/Urinary Tract: Well-defined 1.9 cm left adrenal nodule,  incompletely characterized, but stable compared to remote prior  study from 05/08/2010 at which point this was previously  characterized as an adenoma. Subcentimeter low-attenuation lesion in  the interpolar region of the left kidney, too small to characterize,  but statistically likely to represent a tiny cyst. Right kidney and  bilateral adrenal glands are normal in appearance. No  hydroureteronephrosis. Urinary bladder is normal in appearance.  Stomach/Bowel: Normal appearance of the stomach. No pathologic  dilatation of small bowel or colon. Normal appendix.  Vascular/Lymphatic: Aortic atherosclerosis with postprocedural  changes of aorto bi-iliac bypass graft which is patent bilaterally.  Fusiform aneurysmal dilatation of the infrarenal abdominal aorta  with the aneurysm sac measuring up to 4.5 x 3.6 cm (axial image 131  of series 14), increased in size compared to prior study from  05/08/2010. Vascular findings and measurements pertinent to  potential TAVR procedure, as detailed below. There are no aggressive  appearing lytic or blastic lesions noted in the visualized portions  of the skeleton.  Reproductive: Brachytherapy implants throughout the prostate gland.  Seminal vesicles are unremarkable in appearance.  Other: No significant volume of ascites. No pneumoperitoneum.  Musculoskeletal: There are no aggressive appearing lytic or blastic  lesions noted in the visualized portions of the skeleton.  VASCULAR MEASUREMENTS PERTINENT TO TAVR:  AORTA:  Minimal Aortic Diameter-16 x 19 mm  Severity of Aortic Calcification-mild-to-moderate  RIGHT PELVIS:  Right Common Iliac Artery -  Minimal Diameter-10.9 x 8.8 mm  Tortuosity-mild  Calcification-stent   Right External Iliac Artery -  Minimal Diameter-8.2 x 6.3 mm  Tortuosity-severe  Calcification-mild  Right Common Femoral Artery -  Minimal Diameter-7.7 x 7.7 mm  Tortuosity-mild  Calcification-moderate  LEFT PELVIS:  Left Common Iliac Artery -  Minimal Diameter-9.7 x 8.2 mm  Tortuosity-mild  Calcification-stent  Left External Iliac Artery -  Minimal Diameter-7.2 x 6.6 mm  Tortuosity-severe  Calcification-moderate  Left Common Femoral Artery -  Minimal Diameter-8.8 x 7.8 mm  Tortuosity-mild  Calcification-moderate  Review of the MIP images confirms the above findings.  IMPRESSION:  1. Vascular findings and measurements pertinent to potential TAVR  procedure, as detailed above.  2. Severe thickening calcification of the aortic valve, compatible  with reported clinical history of severe aortic stenosis.  3. 2.4 x 2.3 x 1.4 cm aggressive appearing left upper lobe nodule,  highly concerning for primary bronchogenic neoplasm. Further  evaluation with PET-CT should be considered in the near future.  4. 4.5 x 3.6 cm fusiform aneurysmal dilatation of the infrarenal  abdominal aorta in this patient status post aorto bi-iliac bypass  graft. Notably, this appears larger than remote prior study from  05/08/2010, which may suggest endoleak (no definitive signs of  endoleak are confidently identified on today's arterial phase  examination).  5. 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  6. Additional incidental findings, as above.  Electronically Signed  By: Vinnie Langton M.D.  On: 10/31/2018 14:59  Impression:   This 78 year old gentleman has a 2.4 x 2.3 cm macrolobulated and slightly spiculated lung mass in the anterior aspect of the lingula that is suspicious for lung cancer. He also has adenopathy in the mediastinum, left hilum, and aortopulmonary region.  The PET scan shows the left upper lobe nodule to be hypermetabolic consistent with bronchogenic  carcinoma. There are borderline enlarged lymph nodes in the anterior mediastinum and aortopulmonary window which have low-grade hypermetabolic activity which is similar to or below the blood pool activity and therefore not diagnostic  of metastatic carcinoma.  He has successfully had his aortic stenosis treated with TAVR.  I think the best option is to proceed with flexible bronchoscopy and mediastinoscopy for lymph node biopsies to rule out lymph node metastases before considering surgical treatment of the left upper lobe lung mass.  I discussed the operative procedure with the patient including alternatives, benefits, and risk including but not limited to bleeding, blood transfusion, injury to mediastinal structures, possible need chest tube or emergent thoracotomy and he understands and agrees to proceed.  Plan:   Flexible fiberoptic bronchoscopy and mediastinoscopy with lymph node biopsies.   Gaye Pollack, MD

## 2019-01-05 NOTE — Anesthesia Preprocedure Evaluation (Addendum)
Anesthesia Evaluation  Patient identified by MRN, date of birth, ID band Patient awake    Reviewed: Allergy & Precautions, NPO status , Patient's Chart, lab work & pertinent test results  Airway Mallampati: II  TM Distance: >3 FB Neck ROM: Full    Dental no notable dental hx.    Pulmonary neg pulmonary ROS, Current Smoker and Patient abstained from smoking.,    Pulmonary exam normal breath sounds clear to auscultation       Cardiovascular hypertension, Pt. on medications and Pt. on home beta blockers + Peripheral Vascular Disease  Normal cardiovascular exam+ dysrhythmias Atrial Fibrillation  Rhythm:Regular Rate:Normal     Neuro/Psych negative neurological ROS  negative psych ROS   GI/Hepatic Neg liver ROS, GERD  ,  Endo/Other  negative endocrine ROSdiabetes  Renal/GU negative Renal ROS  negative genitourinary   Musculoskeletal negative musculoskeletal ROS (+)   Abdominal   Peds negative pediatric ROS (+)  Hematology negative hematology ROS (+)   Anesthesia Other Findings   Reproductive/Obstetrics negative OB ROS                            Anesthesia Physical Anesthesia Plan  ASA: III  Anesthesia Plan: General   Post-op Pain Management:    Induction: Intravenous and Rapid sequence  PONV Risk Score and Plan: Ondansetron, Dexamethasone and Treatment may vary due to age or medical condition  Airway Management Planned: Oral ETT  Additional Equipment:   Intra-op Plan:   Post-operative Plan: Extubation in OR  Informed Consent: I have reviewed the patients History and Physical, chart, labs and discussed the procedure including the risks, benefits and alternatives for the proposed anesthesia with the patient or authorized representative who has indicated his/her understanding and acceptance.     Dental advisory given  Plan Discussed with: CRNA  Anesthesia Plan Comments: (He  underwent successful TAVR with a25mm Edwards Sapien 3 THV via the TF approach on 7/72020. Post operative echoshowed EF 50-55%, normally functioning TAVRwith mean gradient 19 mmHg and mild PVL. He was discharged on home Xarelto and aspirin.   Pre TAVR chest CT showeda 2.4 x 2.3 cm macrolobulated and slightly spiculated lung mass in the anterior aspect of the lingula suspicious for lung cancer. SubsequentPET scan showedthe left upper lobe nodule to be hypermetabolic consistent with bronchogenic carcinoma. There were borderline enlarged lymph nodes in the anterior mediastinum that were not diagnostic of metastatic carcinoma. Plan was made for TAVR followed bybronchoscopy and mediastinoscopy to assess the mediastinal lymph nodes for microscopic metastasis.  Per followup note by Dr. Marlou Porch 12/14/18 "OK to contact Dr. Cyndia Bent to discuss timing of bronchososcopy/mediastinoscopy."  Echo 12/28/18: IMPRESSIONS  1. The left ventricle has low normal systolic function, with an ejection fraction of 50-55%. The cavity size was normal. There is moderately increased left ventricular wall thickness. Left ventricular diastolic Doppler parameters are indeterminate. No  evidence of left ventricular regional wall motion abnormalities.  2. Left atrial size was moderately dilated.  3. Right atrial size was moderately dilated.  4. The mitral valve is abnormal. Mild thickening of the mitral valve leaflet.  5. The tricuspid valve is grossly normal.  6. A 26 an Edwards Edwards Sapien bioprosthetic aortic valve (TAVR) valve is present in the aortic position. Procedure Date: 12/06/2018 Echo findings are consistent with perivalvular leak of the aortic prosthesis.  7. Aortic valve regurgitation is mild by color flow Doppler. No stenosis of the aortic valve.  8. The aorta is  abnormal in size and structure.  9. There is mild dilatation of the ascending aorta measuring 39 mm. 10. When compared to the prior study: 12/07/2018: LVEF  50-55%, aorta dilated to 4.4 cm, mild perivalvular AI, mean AOV gradient 19 mmHg.  SUMMARY LVEF 50-55%, moderate LVH, normal wall motion, moderate biatrial enlargement, s/p 26 mm Edwards Sapien 3 TAVR, mild perivalvular leak, mean gradient across the aortic valve was not obtained (I asked the tech to call the patient back to obtain the images), ascending aorta dilated at 3.9 cm.)       Anesthesia Quick Evaluation

## 2019-01-06 ENCOUNTER — Ambulatory Visit (HOSPITAL_COMMUNITY): Payer: Medicare Other | Admitting: Certified Registered Nurse Anesthetist

## 2019-01-06 ENCOUNTER — Ambulatory Visit (HOSPITAL_COMMUNITY)
Admission: RE | Admit: 2019-01-06 | Discharge: 2019-01-06 | Disposition: A | Discharge status: Home / Self Care | Payer: Medicare Other | Attending: Surgery | Admitting: Surgery

## 2019-01-06 ENCOUNTER — Other Ambulatory Visit: Payer: Self-pay

## 2019-01-06 ENCOUNTER — Encounter (HOSPITAL_COMMUNITY)
Admission: RE | Disposition: A | Discharge status: Home / Self Care | Payer: Self-pay | Source: Home / Self Care | Attending: Surgery

## 2019-01-06 ENCOUNTER — Ambulatory Visit (HOSPITAL_COMMUNITY): Payer: Medicare Other

## 2019-01-06 ENCOUNTER — Encounter (HOSPITAL_COMMUNITY): Payer: Self-pay | Admitting: General Practice

## 2019-01-06 ENCOUNTER — Other Ambulatory Visit: Payer: Self-pay | Admitting: Surgery

## 2019-01-06 ENCOUNTER — Ambulatory Visit (HOSPITAL_COMMUNITY): Payer: Medicare Other | Admitting: Physician Assistant

## 2019-01-06 DIAGNOSIS — Z7901 Long term (current) use of anticoagulants: Secondary | ICD-10-CM | POA: Insufficient documentation

## 2019-01-06 DIAGNOSIS — R918 Other nonspecific abnormal finding of lung field: Secondary | ICD-10-CM

## 2019-01-06 DIAGNOSIS — M1990 Unspecified osteoarthritis, unspecified site: Secondary | ICD-10-CM | POA: Insufficient documentation

## 2019-01-06 DIAGNOSIS — E785 Hyperlipidemia, unspecified: Secondary | ICD-10-CM | POA: Insufficient documentation

## 2019-01-06 DIAGNOSIS — R911 Solitary pulmonary nodule: Secondary | ICD-10-CM

## 2019-01-06 DIAGNOSIS — R599 Enlarged lymph nodes, unspecified: Secondary | ICD-10-CM | POA: Insufficient documentation

## 2019-01-06 DIAGNOSIS — I2510 Atherosclerotic heart disease of native coronary artery without angina pectoris: Secondary | ICD-10-CM | POA: Diagnosis not present

## 2019-01-06 DIAGNOSIS — Z8546 Personal history of malignant neoplasm of prostate: Secondary | ICD-10-CM | POA: Diagnosis not present

## 2019-01-06 DIAGNOSIS — J449 Chronic obstructive pulmonary disease, unspecified: Secondary | ICD-10-CM | POA: Diagnosis not present

## 2019-01-06 DIAGNOSIS — I10 Essential (primary) hypertension: Secondary | ICD-10-CM | POA: Insufficient documentation

## 2019-01-06 DIAGNOSIS — K219 Gastro-esophageal reflux disease without esophagitis: Secondary | ICD-10-CM | POA: Diagnosis not present

## 2019-01-06 DIAGNOSIS — I4891 Unspecified atrial fibrillation: Secondary | ICD-10-CM | POA: Insufficient documentation

## 2019-01-06 DIAGNOSIS — F17210 Nicotine dependence, cigarettes, uncomplicated: Secondary | ICD-10-CM | POA: Diagnosis not present

## 2019-01-06 DIAGNOSIS — Z7984 Long term (current) use of oral hypoglycemic drugs: Secondary | ICD-10-CM | POA: Diagnosis not present

## 2019-01-06 DIAGNOSIS — F419 Anxiety disorder, unspecified: Secondary | ICD-10-CM | POA: Diagnosis not present

## 2019-01-06 DIAGNOSIS — E1151 Type 2 diabetes mellitus with diabetic peripheral angiopathy without gangrene: Secondary | ICD-10-CM | POA: Diagnosis not present

## 2019-01-06 DIAGNOSIS — Z79899 Other long term (current) drug therapy: Secondary | ICD-10-CM | POA: Diagnosis not present

## 2019-01-06 DIAGNOSIS — Z952 Presence of prosthetic heart valve: Secondary | ICD-10-CM | POA: Insufficient documentation

## 2019-01-06 DIAGNOSIS — E119 Type 2 diabetes mellitus without complications: Secondary | ICD-10-CM | POA: Diagnosis not present

## 2019-01-06 DIAGNOSIS — R222 Localized swelling, mass and lump, trunk: Secondary | ICD-10-CM | POA: Diagnosis not present

## 2019-01-06 HISTORY — PX: VIDEO BRONCHOSCOPY: SHX5072

## 2019-01-06 HISTORY — PX: MEDIASTINOSCOPY: SHX5086

## 2019-01-06 LAB — GLUCOSE, CAPILLARY
Glucose-Capillary: 136 mg/dL — ABNORMAL HIGH (ref 70–99)
Glucose-Capillary: 132 mg/dL — ABNORMAL HIGH (ref 70–99)

## 2019-01-06 IMAGING — DX PORTABLE CHEST - 1 VIEW
1 series · 1 of 1 positions shown · non-contrast
Comparison: [DATE].

CLINICAL DATA: Post mediastinal biopsy

EXAM:
PORTABLE CHEST 1 VIEW

[chest]
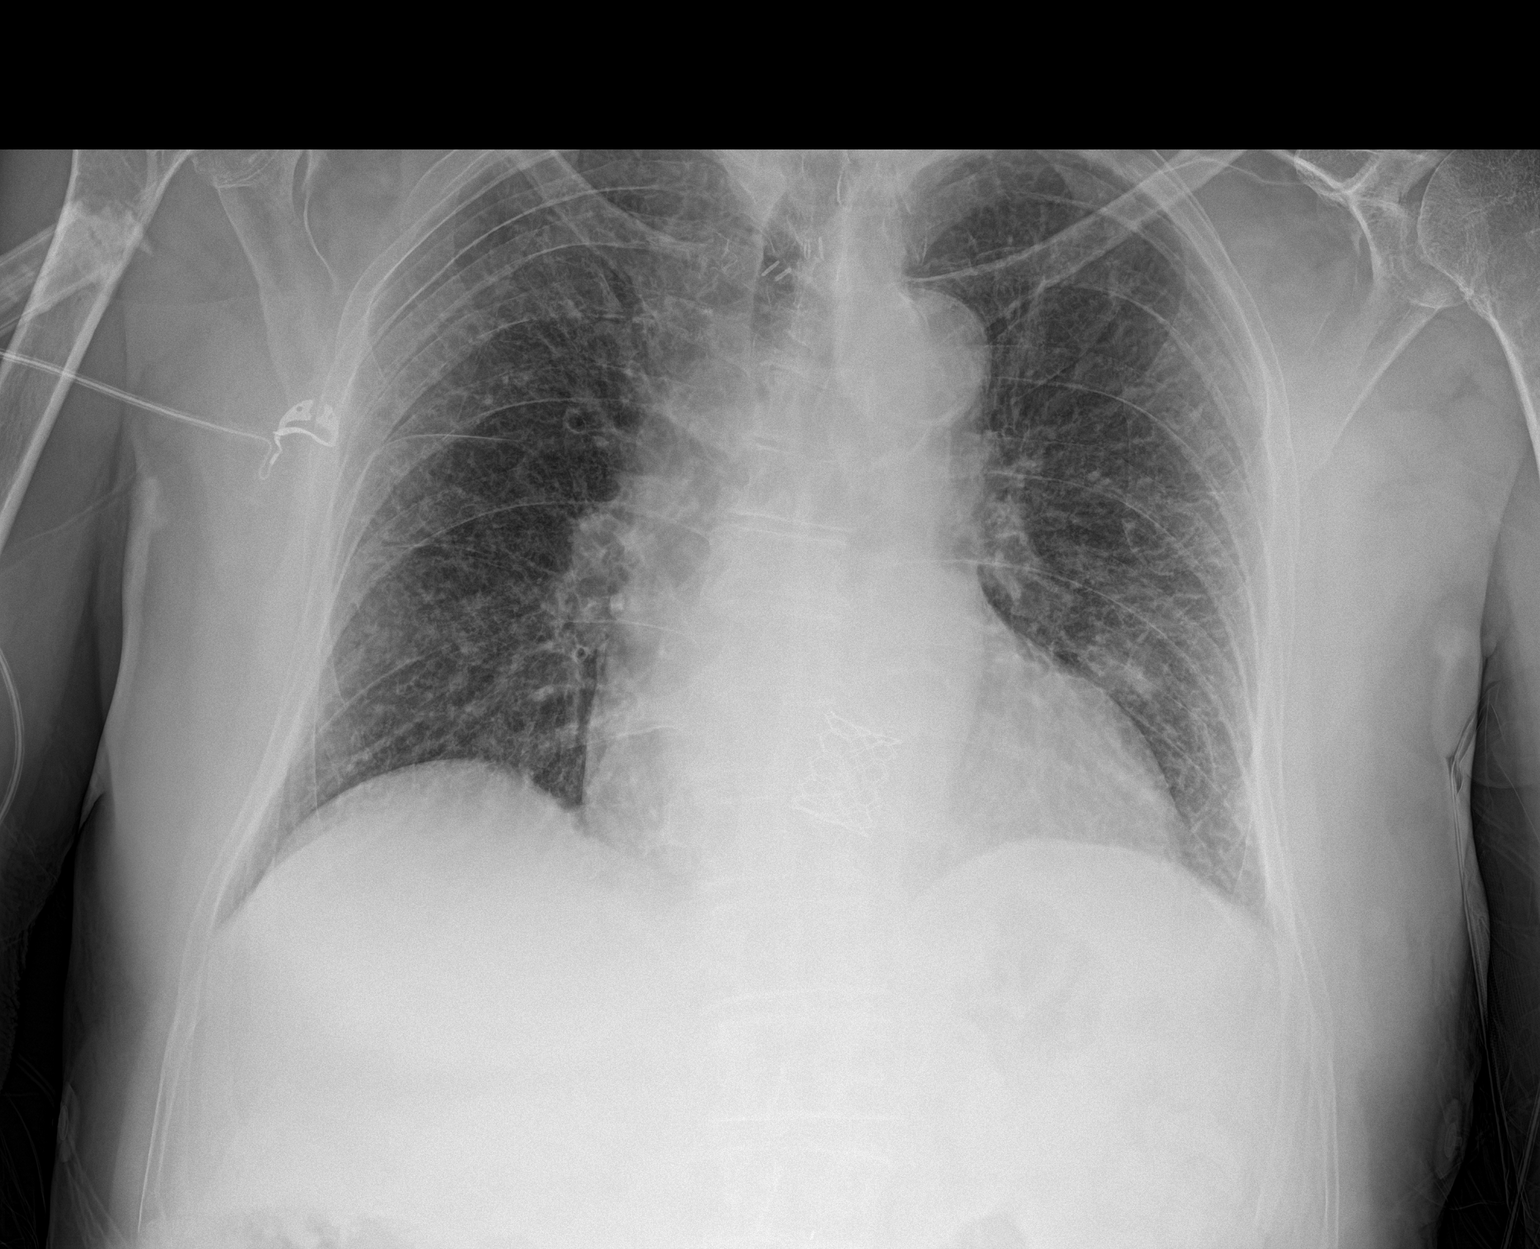

[1 of 1 positions shown; findings below may reference images not displayed]

FINDINGS: There is mild cardiomegaly. Again noted is prosthetic aortic valve.
No pneumothorax. There is mildly increased interstitial markings
seen throughout both lungs. There is a hazy pulmonary nodule in the
left mid lung. No pleural effusion.
IMPRESSION: No acute cardiopulmonary.

## 2019-01-06 SURGERY — BRONCHOSCOPY, VIDEO-ASSISTED
Anesthesia: General | Site: Chest

## 2019-01-06 MED ORDER — LIDOCAINE HCL 2 % IJ SOLN
INTRAMUSCULAR | Status: AC
  Filled 2019-01-06: qty 20

## 2019-01-06 MED ORDER — SUGAMMADEX SODIUM 200 MG/2ML IV SOLN
INTRAVENOUS | Status: DC | PRN
  Administered 2019-01-06: 200 mg via INTRAVENOUS

## 2019-01-06 MED ORDER — LIDOCAINE 2% (20 MG/ML) 5 ML SYRINGE
INTRAMUSCULAR | Status: DC | PRN
  Administered 2019-01-06: 100 mg via INTRAVENOUS

## 2019-01-06 MED ORDER — HYDROMORPHONE HCL 1 MG/ML IJ SOLN: 0.2500 mg | INTRAMUSCULAR | Status: DC | PRN

## 2019-01-06 MED ORDER — PROMETHAZINE HCL 25 MG/ML IJ SOLN: 6.2500 mg | INTRAMUSCULAR | Status: DC | PRN

## 2019-01-06 MED ORDER — SODIUM CHLORIDE 0.9 % IV SOLN
INTRAVENOUS | Status: DC | PRN
  Administered 2019-01-06: 25 ug/min via INTRAVENOUS

## 2019-01-06 MED ORDER — SUCCINYLCHOLINE CHLORIDE 200 MG/10ML IV SOSY
PREFILLED_SYRINGE | INTRAVENOUS | Status: DC | PRN
  Administered 2019-01-06: 120 mg via INTRAVENOUS

## 2019-01-06 MED ORDER — ONDANSETRON HCL 4 MG/2ML IJ SOLN
INTRAMUSCULAR | Status: DC | PRN
  Administered 2019-01-06: 4 mg via INTRAVENOUS

## 2019-01-06 MED ORDER — ROCURONIUM BROMIDE 10 MG/ML (PF) SYRINGE
PREFILLED_SYRINGE | INTRAVENOUS | Status: DC | PRN
  Administered 2019-01-06: 30 mg via INTRAVENOUS

## 2019-01-06 MED ORDER — OXYCODONE-ACETAMINOPHEN 5-325 MG PO TABS: 1.0000 | ORAL_TABLET | ORAL | 0 refills | Status: DC | PRN

## 2019-01-06 MED ORDER — PROPOFOL 10 MG/ML IV BOLUS
INTRAVENOUS | Status: DC | PRN
  Administered 2019-01-06: 150 mg via INTRAVENOUS

## 2019-01-06 MED ORDER — DEXAMETHASONE SODIUM PHOSPHATE 10 MG/ML IJ SOLN
INTRAMUSCULAR | Status: DC | PRN
  Administered 2019-01-06: 8 mg via INTRAVENOUS

## 2019-01-06 MED ORDER — FENTANYL CITRATE (PF) 250 MCG/5ML IJ SOLN
INTRAMUSCULAR | Status: DC | PRN
  Administered 2019-01-06 (×3): 50 ug via INTRAVENOUS

## 2019-01-06 MED ORDER — LIDOCAINE HCL (PF) 1 % IJ SOLN
INTRAMUSCULAR | Status: AC
  Filled 2019-01-06: qty 30

## 2019-01-06 MED ORDER — 0.9 % SODIUM CHLORIDE (POUR BTL) OPTIME
TOPICAL | Status: DC | PRN
  Administered 2019-01-06: 08:00:00 2000 mL

## 2019-01-06 MED ORDER — PROPOFOL 10 MG/ML IV BOLUS
INTRAVENOUS | Status: AC
  Filled 2019-01-06: qty 20

## 2019-01-06 MED ORDER — CEFAZOLIN SODIUM-DEXTROSE 2-4 GM/100ML-% IV SOLN
2.0000 g | INTRAVENOUS | Status: AC
  Administered 2019-01-06: 2 g via INTRAVENOUS
  Filled 2019-01-06: qty 100

## 2019-01-06 MED ORDER — PHENYLEPHRINE 40 MCG/ML (10ML) SYRINGE FOR IV PUSH (FOR BLOOD PRESSURE SUPPORT)
PREFILLED_SYRINGE | INTRAVENOUS | Status: DC | PRN
  Administered 2019-01-06 (×2): 40 ug via INTRAVENOUS
  Administered 2019-01-06: 80 ug via INTRAVENOUS

## 2019-01-06 MED ORDER — FENTANYL CITRATE (PF) 250 MCG/5ML IJ SOLN
INTRAMUSCULAR | Status: AC
  Filled 2019-01-06: qty 5

## 2019-01-06 MED ORDER — LACTATED RINGERS IV SOLN
INTRAVENOUS | Status: DC | PRN
  Administered 2019-01-06 (×2): via INTRAVENOUS

## 2019-01-06 MED ORDER — MUPIROCIN 2 % EX OINT
1.0000 "application " | TOPICAL_OINTMENT | Freq: Once | CUTANEOUS | Status: AC
  Administered 2019-01-06: 1 via TOPICAL
  Filled 2019-01-06: qty 22

## 2019-01-06 SURGICAL SUPPLY — 62 items
ADH SKN CLS APL DERMABOND .7 (GAUZE/BANDAGES/DRESSINGS) ×2
BALL CTTN LRG ABS STRL LF (GAUZE/BANDAGES/DRESSINGS)
BLADE CLIPPER SURG (BLADE) ×4 IMPLANT
BLADE SURG 15 STRL LF DISP TIS (BLADE) ×2 IMPLANT
BLADE SURG 15 STRL SS (BLADE) ×4
BRUSH CYTOL CELLEBRITY 1.5X140 (MISCELLANEOUS) IMPLANT
CANISTER SUCT 3000ML PPV (MISCELLANEOUS) ×4 IMPLANT
CLIP VESOCCLUDE MED 6/CT (CLIP) ×6 IMPLANT
CONT SPEC 4OZ CLIKSEAL STRL BL (MISCELLANEOUS) ×12 IMPLANT
COTTONBALL LRG STERILE PKG (GAUZE/BANDAGES/DRESSINGS) IMPLANT
COVER BACK TABLE 60X90IN (DRAPES) ×4 IMPLANT
COVER SURGICAL LIGHT HANDLE (MISCELLANEOUS) ×8 IMPLANT
COVER WAND RF STERILE (DRAPES) ×4 IMPLANT
DERMABOND ADVANCED (GAUZE/BANDAGES/DRESSINGS) ×2
DERMABOND ADVANCED .7 DNX12 (GAUZE/BANDAGES/DRESSINGS) ×2 IMPLANT
DRAPE LAPAROTOMY T 102X78X121 (DRAPES) ×4 IMPLANT
ELECT CAUTERY BLADE 6.4 (BLADE) ×4 IMPLANT
ELECT REM PT RETURN 9FT ADLT (ELECTROSURGICAL) ×4
ELECTRODE REM PT RTRN 9FT ADLT (ELECTROSURGICAL) ×2 IMPLANT
FORCEPS BIOP RJ4 1.8 (CUTTING FORCEPS) IMPLANT
GAUZE SPONGE 4X4 12PLY STRL (GAUZE/BANDAGES/DRESSINGS) ×4 IMPLANT
GLOVE EUDERMIC 7 POWDERFREE (GLOVE) ×4 IMPLANT
GOWN STRL NON-REIN LRG LVL3 (GOWN DISPOSABLE) ×2 IMPLANT
GOWN STRL REUS W/ TWL LRG LVL3 (GOWN DISPOSABLE) ×2 IMPLANT
GOWN STRL REUS W/ TWL XL LVL3 (GOWN DISPOSABLE) ×2 IMPLANT
GOWN STRL REUS W/TWL LRG LVL3 (GOWN DISPOSABLE) ×4
GOWN STRL REUS W/TWL XL LVL3 (GOWN DISPOSABLE) ×4
HEMOSTAT SURGICEL 2X14 (HEMOSTASIS) IMPLANT
KIT BASIN OR (CUSTOM PROCEDURE TRAY) ×4 IMPLANT
KIT CLEAN ENDO COMPLIANCE (KITS) ×4 IMPLANT
KIT TURNOVER KIT B (KITS) ×4 IMPLANT
MARKER SKIN DUAL TIP RULER LAB (MISCELLANEOUS) ×4 IMPLANT
NEEDLE 22X1 1/2 (OR ONLY) (NEEDLE) IMPLANT
NS IRRIG 1000ML POUR BTL (IV SOLUTION) ×4 IMPLANT
OIL SILICONE PENTAX (PARTS (SERVICE/REPAIRS)) ×4 IMPLANT
PACK SURGICAL SETUP 50X90 (CUSTOM PROCEDURE TRAY) ×4 IMPLANT
PAD ARMBOARD 7.5X6 YLW CONV (MISCELLANEOUS) ×8 IMPLANT
PENCIL BUTTON HOLSTER BLD 10FT (ELECTRODE) ×4 IMPLANT
SPONGE INTESTINAL PEANUT (DISPOSABLE) ×4 IMPLANT
SUT SILK 2 0 TIES 10X30 (SUTURE) IMPLANT
SUT VIC AB 2-0 CT1 27 (SUTURE) ×4
SUT VIC AB 2-0 CT1 TAPERPNT 27 (SUTURE) ×2 IMPLANT
SUT VIC AB 3-0 SH 27 (SUTURE)
SUT VIC AB 3-0 SH 27X BRD (SUTURE) IMPLANT
SUT VICRYL 4-0 PS2 18IN ABS (SUTURE) ×4 IMPLANT
SYR 10ML LL (SYRINGE) ×4 IMPLANT
SYR 20ML ECCENTRIC (SYRINGE) ×4 IMPLANT
SYR 20ML LL LF (SYRINGE) ×2 IMPLANT
SYR 5ML LUER SLIP (SYRINGE) ×4 IMPLANT
SYR BULB IRRIGATION 50ML (SYRINGE) ×2 IMPLANT
SYR CONTROL 10ML LL (SYRINGE) IMPLANT
TOWEL GREEN STERILE (TOWEL DISPOSABLE) ×4 IMPLANT
TOWEL GREEN STERILE FF (TOWEL DISPOSABLE) ×4 IMPLANT
TRAP SPECIMEN MUCOUS 40CC (MISCELLANEOUS) ×4 IMPLANT
TUBE CONNECTING 12'X1/4 (SUCTIONS) ×1
TUBE CONNECTING 12X1/4 (SUCTIONS) ×3 IMPLANT
TUBE CONNECTING 20'X1/4 (TUBING) ×2
TUBE CONNECTING 20X1/4 (TUBING) ×4 IMPLANT
VALVE BIOPSY  SINGLE USE (MISCELLANEOUS) ×2
VALVE BIOPSY SINGLE USE (MISCELLANEOUS) IMPLANT
VALVE SUCTION BRONCHIO DISP (MISCELLANEOUS) ×2 IMPLANT
WATER STERILE IRR 1000ML POUR (IV SOLUTION) ×4 IMPLANT

## 2019-01-06 NOTE — Anesthesia Procedure Notes (Signed)
Procedure Name: Intubation Date/Time: 01/06/2019 7:45 AM Performed by: Marsa Aris, CRNA Pre-anesthesia Checklist: Patient identified, Emergency Drugs available, Suction available and Patient being monitored Patient Re-evaluated:Patient Re-evaluated prior to induction Oxygen Delivery Method: Circle System Utilized Preoxygenation: Pre-oxygenation with 100% oxygen Induction Type: IV induction Ventilation: Mask ventilation without difficulty Laryngoscope Size: Miller and 2 Grade View: Grade I Tube type: Oral Tube size: 9.0 mm Number of attempts: 1 Airway Equipment and Method: Stylet and Oral airway Placement Confirmation: ETT inserted through vocal cords under direct vision,  positive ETCO2 and breath sounds checked- equal and bilateral Secured at: 22 cm Tube secured with: Tape Dental Injury: Teeth and Oropharynx as per pre-operative assessment

## 2019-01-06 NOTE — Op Note (Signed)
CARDIOVASCULAR SURGERY OPERATIVE NOTE  01/06/2019 Vincent Velez [id number]  Surgeon:  Gaye Pollack, MD  First Assistant: none   Preoperative Diagnosis:  Left upper lobe lung mass   Postoperative Diagnosis:  Same   Procedure:  1. Flexible video bronchoscopy 2. Mediastinoscopy with lymph node biopsies    Anesthesia:  General Endotracheal   Clinical History/Surgical Indication:  The patient is a 78 year old gentleman with a history of hypertension, hyperlipidemia, diabetes, ongoing smoking with COPD, persistent atrial fibrillation on anticoagulation, abdominal aortic aneurysm s/p EVAR and moderate aortic stenosis that was successfully treated with TAVR on 12/06/2018. The preoperative PET scan shows a 2.4 x 2.3 cm macrolobulated and slightly spiculated lung mass in the anterior aspect of the lingula that is suspicious for lung cancer. There are borderline enlarged lymph nodes in the anterior mediastinum and aortopulmonary window which have low-grade hypermetabolic activity which is similar to or below the blood pool activity and therefore not diagnostic of metastatic carcinoma.   He has successfully had his aortic stenosis treated with TAVR.  I think the best option is to proceed with flexible bronchoscopy and mediastinoscopy for lymph node biopsies to rule out lymph node metastases before considering surgical treatment of the left upper lobe lung mass.  I discussed the operative procedure with the patient including alternatives, benefits, and risk including but not limited to bleeding, blood transfusion, injury to mediastinal structures, possible need chest tube or emergent thoracotomy and he understands and agrees to proceed.   Preparation:  The patient was seen in the preoperative holding area and the correct patient, correct operation were confirmed with the patient after reviewing the medical record and imaging. The consent was signed by me. Preoperative antibiotics were given.  A radial arterial line were placed by the anesthesia team. The patient was taken back to the operating room and positioned supine on the operating room table. After being placed under general endotracheal anesthesia by the anesthesia team a surgical time-out was taken and the correct patient and operative procedure were confirmed with the nursing and anesthesia staff.  Flexible Bronchoscopy:  The video bronchoscope was passed down the endotracheal tube. The distal trachea was normal. The carina was sharp. The left bronchial tree had normal segmental anatomy with no endobronchial lesions or extrinsic compression. The right bronchial tree had normal segmental anatomy with no endobronchial lesions or extrinsic compression. There was copious mucous present probably related to his chronic smoking.  Mediastinoscopy:  A roll was placed beneath the shoulders and the neck slightly extended. The neck and chest were prepped with betadine soap and solution and draped in the usual sterile manner. A 2nd time out was taken and the correct patient and procedure were confirmed with nursing and anesthesia. A 2 cm incision was made horizontally just above the sternal notch in the natural skin crease. Electrocautery was used to continue down through the subcutaneous tissue. The strap muscles were separated in the midline to expose the trachea. A pretracheal plane was developed bluntly and the mediastinoscope inserted. It was advanced along the anterior tracheal wall. There were multiple lymph nodes seen and biopsies were taken at 2 R, 4 R, 7, 4 L, and pretracheal locations. These were sent for permanent pathology. Hemostasis was complete at the end of the procedure. The scope was removed and the strap muscles re-approximated with interrupted 3-0 vicryl sutures. The platysma muscle was re-approximated with 3-0 vicryl suture and the skin with 4-0 vicryl subcuticular suture. Dermabond was applied. The sponge, needle, and  instrument counts were correct according to the nurses. The patient was awakened, extubated and transported to the PACU in stable condition.

## 2019-01-06 NOTE — Brief Op Note (Signed)
01/06/2019  9:04 AM  PATIENT:  Vincent Velez  78 y.o. male  PRE-OPERATIVE DIAGNOSIS:  LUL NODULE  POST-OPERATIVE DIAGNOSIS:  Left Upper Lung Nodule  PROCEDURE:  Procedure(s): VIDEO BRONCHOSCOPY (N/A) MEDIASTINOSCOPY WITH BIOPIES (N/A)  SURGEON:  Surgeon(s) and Role:    * Thaer Miyoshi, Fernande Boyden, MD - Primary  PHYSICIAN ASSISTANT: none   ANESTHESIA:   general  EBL:  50 mL   BLOOD ADMINISTERED:none  DRAINS: none   LOCAL MEDICATIONS USED:  NONE  SPECIMEN:  Source of Specimen:  mediastinal lymph nodes, 2R, 4R, 7, 2L, pretracheal  DISPOSITION OF SPECIMEN:  PATHOLOGY  COUNTS:  YES  TOURNIQUET:  * No tourniquets in log *  DICTATION: .Note written in EPIC  PLAN OF CARE: Discharge to home after PACU  PATIENT DISPOSITION:  PACU - hemodynamically stable.   Delay start of Pharmacological VTE agent (>24hrs) due to surgical blood loss or risk of bleeding: not applicable

## 2019-01-06 NOTE — Anesthesia Procedure Notes (Signed)
Arterial Line Insertion Start/End8/11/2018 7:00 AM, 01/06/2019 7:05 AM Performed by: Myrtie Soman, MD, Marsa Aris, CRNA, CRNA  Patient location: Pre-op. Preanesthetic checklist: patient identified, IV checked, site marked, risks and benefits discussed, surgical consent, monitors and equipment checked, pre-op evaluation, timeout performed and anesthesia consent Lidocaine 1% used for infiltration Right, radial was placed Catheter size: 20 Fr Hand hygiene performed  and maximum sterile barriers used   Attempts: 1 Procedure performed without using ultrasound guided technique. Following insertion, dressing applied. Post procedure assessment: normal and unchanged  Patient tolerated the procedure well with no immediate complications.

## 2019-01-06 NOTE — Interval H&P Note (Signed)
History and Physical Interval Note:  01/06/2019 7:32 AM  Vincent Velez  has presented today for surgery, with the diagnosis of LUL NODULE.  The various methods of treatment have been discussed with the patient and family. After consideration of risks, benefits and other options for treatment, the patient has consented to  Procedure(s): VIDEO BRONCHOSCOPY (N/A) MEDIASTINOSCOPY (N/A) as a surgical intervention.  The patient's history has been reviewed, patient examined, no change in status, stable for surgery.  I have reviewed the patient's chart and labs.  Questions were answered to the patient's satisfaction.     Gaye Pollack

## 2019-01-06 NOTE — Anesthesia Postprocedure Evaluation (Signed)
Anesthesia Post Note  Patient: Vincent Velez  Procedure(s) Performed: VIDEO BRONCHOSCOPY (N/A ) MEDIASTINOSCOPY WITH BIOPIES (N/A Chest)     Patient location during evaluation: PACU Anesthesia Type: General Level of consciousness: awake and alert Pain management: pain level controlled Vital Signs Assessment: post-procedure vital signs reviewed and stable Respiratory status: spontaneous breathing, nonlabored ventilation, respiratory function stable and patient connected to nasal cannula oxygen Cardiovascular status: blood pressure returned to baseline and stable Postop Assessment: no apparent nausea or vomiting Anesthetic complications: no    Last Vitals:  Vitals:   01/06/19 1000 01/06/19 1010  BP: (!) 123/91   Pulse: 68 67  Resp: (!) 21 (!) 21  Temp:    SpO2: 97% 98%    Last Pain:  Vitals:   01/06/19 1010  TempSrc:   PainSc: 0-No pain                 Elverna Caffee S

## 2019-01-06 NOTE — Transfer of Care (Signed)
Immediate Anesthesia Transfer of Care Note  Patient: Vincent Velez  Procedure(s) Performed: VIDEO BRONCHOSCOPY (N/A ) MEDIASTINOSCOPY WITH BIOPIES (N/A Chest)  Patient Location: PACU  Anesthesia Type:General  Level of Consciousness: awake, alert  and oriented  Airway & Oxygen Therapy: Patient Spontanous Breathing and Patient connected to face mask oxygen  Post-op Assessment: Report given to RN and Post -op Vital signs reviewed and stable  Post vital signs: Reviewed and stable  Last Vitals:  Vitals Value Taken Time  BP 150/95 01/06/19 0915  Temp    Pulse 76 01/06/19 0920  Resp 15 01/06/19 0920  SpO2 100 % 01/06/19 0920  Vitals shown include unvalidated device data.  Last Pain:  Vitals:   01/06/19 0636  TempSrc:   PainSc: 0-No pain      Patients Stated Pain Goal: 4 (84/41/71 2787)  Complications: No apparent anesthesia complications and Patient re-intubated

## 2019-01-07 ENCOUNTER — Encounter (HOSPITAL_COMMUNITY): Payer: Self-pay | Admitting: Surgery

## 2019-01-10 ENCOUNTER — Ambulatory Visit (HOSPITAL_COMMUNITY)
Admission: RE | Admit: 2019-01-10 | Discharge: 2019-01-10 | Disposition: A | Discharge status: Home / Self Care | Payer: Medicare Other | Source: Ambulatory Visit | Attending: Surgery | Admitting: Surgery

## 2019-01-10 ENCOUNTER — Other Ambulatory Visit: Payer: Self-pay

## 2019-01-10 DIAGNOSIS — R911 Solitary pulmonary nodule: Secondary | ICD-10-CM | POA: Insufficient documentation

## 2019-01-10 LAB — PULMONARY FUNCTION TEST
DL/VA % pred: 96 %
DL/VA: 3.84 ml/min/mmHg/L
DLCO cor % pred: 77 %
DLCO cor: 18.66 ml/min/mmHg
DLCO unc % pred: 74 %
DLCO unc: 18.06 ml/min/mmHg
FEF 25-75 Post: 4.66 L/sec
FEF 25-75 Pre: 4.43 L/sec
FEF2575-%Change-Post: 5 %
FEF2575-%Pred-Post: 223 %
FEF2575-%Pred-Pre: 212 %
FEV1-%Change-Post: 1 %
FEV1-%Pred-Post: 114 %
FEV1-%Pred-Pre: 112 %
FEV1-Post: 3.31 L
FEV1-Pre: 3.27 L
FEV1FVC-%Change-Post: 1 %
FEV1FVC-%Pred-Pre: 118 %
FEV6-%Change-Post: 0 %
FEV6-%Pred-Post: 100 %
FEV6-%Pred-Pre: 101 %
FEV6-Post: 3.79 L
FEV6-Pre: 3.81 L
FEV6FVC-%Change-Post: 0 %
FEV6FVC-%Pred-Post: 106 %
FEV6FVC-%Pred-Pre: 106 %
FVC-%Change-Post: 0 %
FVC-%Pred-Post: 94 %
FVC-%Pred-Pre: 94 %
FVC-Post: 3.79 L
FVC-Pre: 3.82 L
Post FEV1/FVC ratio: 87 %
Post FEV6/FVC ratio: 100 %
Pre FEV1/FVC ratio: 86 %
Pre FEV6/FVC Ratio: 100 %
RV % pred: 82 %
RV: 2.07 L
TLC % pred: 87 %
TLC: 6 L

## 2019-01-10 MED ORDER — ALBUTEROL SULFATE (2.5 MG/3ML) 0.083% IN NEBU
2.5000 mg | INHALATION_SOLUTION | Freq: Once | RESPIRATORY_TRACT | Status: AC
  Administered 2019-01-10: 11:00:00 2.5 mg via RESPIRATORY_TRACT

## 2019-01-17 NOTE — Progress Notes (Signed)
[facility]                                       Cardiology Office Note    Date:  01/18/2019   ID:  Vincent Velez, DOB 1940-08-16, MRN [id number]  PCP:  [name], MD  Cardiologist: Candee Furbish, MD/ Dr. Jacob Moores. [name])  CC: 1 month s/p TAVR  History of Present Illness:  Vincent Velez is a 78 y.o. male with a history of AAAs/pEVAR 2010 w/Dr Oneida Alar, arthritis,persistentatrial fibrillation/flutter on Xarelto, CAD,ongoing tobacco abuse withCOPD, DMT2, GERD, HTN, HLD, prostate CA,2.5 cmLULlung mass that is hypermetabolic on PET scanandsuspicious for bronchogenic carcinoma, andsevere ASs/p TAVR (12/06/18) who present for follow up.    He had a history ofmoderate aortic stenosis diagnosed by echocardiogram in May 2017with amean gradient was 25 mmHg. The patientdevelopedprogressive exertional fatigue and shortness of breath over the past several months and a follow-up echocardiogram on 08/04/2018 showed progression to severe aortic stenosis with a mean gradient of 41 mmHg, a peak gradient of 61 mmHg, and a dimensionless index of 0.19. Left ventricular ejection fractionwasmildly reduced at 45 to 50% with mild left ventricular dilatation.Cardiac catheterization on 10/26/2018 showed mild nonobstructive coronary disease. Unfortunately, pre TAVR chest CT showeda 2.4 x 2.3 cm macrolobulated and slightly spiculated lung mass in the anterior aspect of the lingula suspicious for lung cancer. SubsequentPET scan showedthe left upper lobe nodule to be hypermetabolic consistent with bronchogenic carcinoma. Therewereborderline enlarged lymph nodes in the anterior mediastinumthat werenot diagnostic of metastatic carcinoma. Plan was made for TAVR followed bybronchoscopy and mediastinoscopy to assess the mediastinal lymph nodes for microscopic metastasis and if negative consider resection of the left upper lobe lung  mass by wedge resection or lobectomy depending on his pulmonary function test.    He underwentsuccessful TAVR with a70mm Edwards Sapien 3 THV via the TF approach on 7/72020. Post operative echoshowed EF 50-55%, normally functioning TAVRwith mean gradient 19 mmHg and mild PVL. He was discharged on home Xarelto and aspirin.    Today he presents to clinic for follow up. No CP or SOB. No LE edema, orthopnea or PND. No dizziness or syncope. No blood in stool or urine. No palpitations. Sometimes he feels like he is a little "woozy headed" when he takes a quick turn.   Past Medical History:  Diagnosis Date  . AAA (abdominal aortic aneurysm) ([place])   . Anxiety   . Arthritis   . Asthma    when younger  . Atrial fibrillation ([place])   . CAD (coronary artery disease)   . Cataract    removed both  . COPD (chronic obstructive pulmonary disease) ([place])   . Diabetes mellitus   . Esophageal reflux   . Family history of malignant neoplasm of gastrointestinal tract   . Hiatal hernia   . Hyperlipemia   . Hypertension   . Lung nodule    a. PET scan highly suspicious for malignancy. Bronch with biopsy to be done after TAVR  . Malignant neoplasm of prostate ([place])    prostate   . Peripheral vascular disease ([place])   . Stricture and stenosis of esophagus     Past Surgical History:  Procedure Laterality Date  . ABDOMINAL AORTA STENT    . CARDIAC CATHETERIZATION    . CARDIOVERSION N/A 12/04/2015   Procedure: CARDIOVERSION;  Surgeon: Sanda Klein, MD;  Location: [facility];  Service: Cardiovascular;  Laterality: N/A;  . COLONOSCOPY    . FINGER SURGERY    . INSERTION PROSTATE RADIATION SEED    . MEDIASTINOSCOPY N/A 01/06/2019   Procedure: MEDIASTINOSCOPY WITH BIOPIES;  Surgeon: Gaye Pollack, MD;  Location: [facility] OR;  Service: Thoracic;  Laterality: N/A;  . RIGHT/LEFT HEART CATH AND CORONARY ANGIOGRAPHY N/A 10/26/2018   Procedure: RIGHT/LEFT HEART CATH AND CORONARY ANGIOGRAPHY;  Surgeon: Burnell Blanks, MD;  Location: [facility];  Service: Cardiovascular;  Laterality: N/A;  . TEE WITHOUT CARDIOVERSION N/A 12/06/2018   Procedure: TRANSESOPHAGEAL ECHOCARDIOGRAM (TEE);  Surgeon: Burnell Blanks, MD;  Location: [facility];  Service: Open Heart Surgery;  Laterality: N/A;  . TRANSCATHETER AORTIC VALVE REPLACEMENT, TRANSFEMORAL N/A 12/06/2018   Procedure: TRANSCATHETER AORTIC VALVE REPLACEMENT, TRANSFEMORAL;  Surgeon: Burnell Blanks, MD;  Location: [facility];  Service: Open Heart Surgery;  Laterality: N/A;  . UPPER GASTROINTESTINAL ENDOSCOPY    . VIDEO BRONCHOSCOPY N/A 01/06/2019   Procedure: VIDEO BRONCHOSCOPY;  Surgeon: Gaye Pollack, MD;  Location: [facility];  Service: Thoracic;  Laterality: N/A;    Current Medications: Outpatient Medications Prior to Visit  Medication Sig Dispense Refill  . aspirin 81 MG chewable tablet Chew 1 tablet (81 mg total) by mouth daily.    . dorzolamide (TRUSOPT) 2 % ophthalmic solution Place 1 drop into both eyes 2 (two) times daily.     Marland Kitchen esomeprazole (NEXIUM) 40 MG capsule Take 40 mg by mouth daily.     Marland Kitchen glimepiride (AMARYL) 2 MG tablet Take 2 mg by mouth daily.    . metFORMIN (GLUCOPHAGE-XR) 500 MG 24 hr tablet Take 1,000 mg by mouth every evening.     . metoprolol succinate (TOPROL-XL) 25 MG 24 hr tablet Take 1 tablet (25 mg total) by mouth daily.    . Multiple Vitamins-Minerals (MULTIVITAMIN WITH MINERALS) tablet Take 1 tablet by mouth daily.    Marland Kitchen oxyCODONE-acetaminophen (PERCOCET) 5-325 MG tablet Take 1 tablet by mouth every 4 (four) hours as needed for severe pain. 10 tablet 0  . simvastatin (ZOCOR) 40 MG tablet Take 40 mg by mouth at bedtime.      Alveda Reasons 20 MG TABS tablet TAKE 1 TABLET BY MOUTH  DAILY WITH SUPPER (Patient taking differently: Take 20 mg by mouth daily with supper. ) 90 tablet 1   No facility-administered medications prior to visit.      Allergies:   Patient has no known allergies.    Social History   Socioeconomic History  . Marital status: Married    Spouse name: Not on file  . Number of children: 2  . Years of education: Not on file  . Highest education level: Not on file  Occupational History  . Occupation: engineer-retired    Employer: [employer]  . Financial resource strain: Not on file  . Food insecurity    Worry: Not on file    Inability: Not on file  . Transportation needs    Medical: Not on file    Non-medical: Not on file  Tobacco Use  . Smoking status: Light Tobacco Smoker    Packs/day: 1.00    Types: Pipe, Cigars  . Smokeless tobacco: Never Used  Substance and Sexual Activity  . Alcohol use: Yes    Alcohol/week: 0.0 standard drinks    Comment: occ  . Drug use: No  . Sexual activity: Not on file  Lifestyle  .  Physical activity    Days per week: Not on file    Minutes per session: Not on file  . Stress: Not on file  Relationships  . Social Herbalist on phone: Not on file    Gets together: Not on file    Attends religious service: Not on file    Active member of club or organization: Not on file    Attends meetings of clubs or organizations: Not on file    Relationship status: Not on file  Other Topics Concern  . Not on file  Social History Narrative  . Not on file     Family History:  The patient's family history includes Colon cancer in his father; Deep vein thrombosis in his mother; Diabetes in his mother; Heart attack in his brother; Heart disease in his mother; Hyperlipidemia in his mother; Hypertension in his brother and mother; Prostate cancer in his brother; Varicose Veins in his mother.     ROS:   Please see the history of present illness.    ROS All other systems reviewed and are negative.   PHYSICAL EXAM:   VS:  BP 123/78   Pulse 96   Ht 5\' 9"  (1.753 m)   Wt 178 lb 12.8 oz (81.1 kg)   SpO2 99%   BMI 26.40 kg/m    GEN: Well nourished, well developed, in no acute distress HEENT: normal  Neck: no JVD or masses Cardiac: irreg irreg; no murmurs, rubs, or gallops,no edema  Respiratory:  clear to auscultation bilaterally, normal work of breathing GI: soft, nontender, nondistended, + BS MS: no deformity or atrophy Skin: warm and dry, no rash Neuro:  Alert and Oriented x 3, Strength and sensation are intact Psych: euthymic mood, full affect   Wt Readings from Last 3 Encounters:  01/18/19 178 lb 12.8 oz (81.1 kg)  01/06/19 178 lb 11.2 oz (81.1 kg)  01/04/19 178 lb 11.2 oz (81.1 kg)      Studies/Labs Reviewed:   EKG:  EKG is NOT ordered today.   Recent Labs: 12/01/2018: B Natriuretic Peptide 72.4 12/07/2018: Magnesium 1.8 01/04/2019: ALT 15; BUN 11; Creatinine, Ser 0.84; Hemoglobin 13.5; Platelets 159; Potassium 4.1; Sodium 138   Lipid Panel    Component Value Date/Time   CHOL 120 09/15/2013 1428   TRIG 110.0 09/15/2013 1428   HDL 37.70 (L) 09/15/2013 1428   CHOLHDL 3 09/15/2013 1428   VLDL 22.0 09/15/2013 1428   LDLCALC 60 09/15/2013 1428    Additional studies/ records that were reviewed today include:  TAVR OPERATIVE NOTE   Date of Procedure:12/06/2018  Preoperative Diagnosis:Severe Aortic Stenosis   Postoperative Diagnosis:Same   Procedure:   Transcatheter Aortic Valve Replacement - PercutaneousRightTransfemoral Approach Edwards Sapien 3 THV (size 79mm, model # 9600TFX, serial # [id number])  Co-Surgeons:Bryan Alveria Apley, MD and Lauree Chandler, MD   Anesthesiologist:R. Ola Spurr, MD  Dala Dock, MD  Pre-operative Echo Findings: ? Severe aortic stenosis ? Mildleft ventricular systolic dysfunction  Post-operative Echo Findings: ? mildparavalvular leak ? Mild unchangedleft ventricular systolic dysfunction  _____________  Echo 12/07/18: IMPRESSIONS 1. The left ventricle has low normal  systolic function, with an ejection fraction of 50-55%. The cavity size was normal. Left ventricular diastolic function could not be evaluated secondary to atrial fibrillation. No evidence of left ventricular  regional wall motion abnormalities. 2. The right ventricle has normal systolic function. The cavity was normal. There is no increase in right ventricular wall thickness. Right ventricular systolic pressure could not be  assessed. 3. Left atrial size was severely dilated. 4. There is moderate dilatation of the ascending aorta measuring 44 mm. 5. The inferior vena cava was normal in size with <50% respiratory variability. 6. - TAVR: S/P Edwards Sapien 3 THV 21mm that appears to be functioning normally. There is mild perivalvular AI. The mean AVG is 20mmHg. AV Vmax: 264.60 cm/s. LVOT/AV VTI ratio: 0.50. AV Area (Vmax): 2.44 cm. 7. Compared to prior echo, a TAVR is now present.   ASSESSMENT & PLAN:   Severe ASs/pTAVR:Echo 01/06/19 showed EF 50-55%, normally functioning TAVR with mild AI. Mean gradient not originally obtained, but he was brought back to more images today. He has amoxicillin for SBE prophylaxis. He is on Xarelto and aspirin. He can stop aspirin after 6 months of therapy (06/2019).    Persistent atrial fibrillation: rate well controlled. Continue Xarelto 20mg  daily for thromboembolic prophylaxis.   HTN:BP well controlled today    Pulmonary nodule:2.5 cmLULlung mass that is hypermetabolic on PET scan suspicious for bronchogenic carcinoma. He underwent flexible video bronchoscopy and mediastinoscopy with lymph node biopsies on 8/7 with Dr. Cyndia Bent. Surgical pathology was negative for lymph node involvement. He has follow up with Dr. Cyndia Bent tomorrow.   Medication Adjustments/Labs and Tests Ordered: Current medicines are reviewed at length with the patient today.  Concerns regarding medicines are outlined above.  Medication changes, Labs and Tests ordered today are listed  in the Patient Instructions below. Patient Instructions  Medication Instructions:  1) You may STOP ASPIRIN on June 08, 2019  Labwork: None  Follow-Up: You are scheduled with Dr. Marlou Porch on May 04, 2019 at 1:20PM.  You are scheduled for your one year TAVR echo and office visit on December 06, 2019. Please arrive by 12:45PM.    Signed, Angelena Form, PA-C  01/18/2019 1:57 PM    [facility] [address], [place], [place]  [address] Phone: [phone]; Fax: [phone]

## 2019-01-18 ENCOUNTER — Ambulatory Visit (INDEPENDENT_AMBULATORY_CARE_PROVIDER_SITE_OTHER): Payer: Medicare Other | Admitting: Physician Assistant

## 2019-01-18 ENCOUNTER — Encounter: Payer: Self-pay | Admitting: Physician Assistant

## 2019-01-18 ENCOUNTER — Other Ambulatory Visit: Payer: Self-pay

## 2019-01-18 ENCOUNTER — Ambulatory Visit (HOSPITAL_COMMUNITY): Payer: Medicare Other | Attending: Cardiovascular Disease

## 2019-01-18 VITALS — BP 123/78 | HR 96 | Ht 69.0 in | Wt 178.8 lb

## 2019-01-18 DIAGNOSIS — I10 Essential (primary) hypertension: Secondary | ICD-10-CM | POA: Diagnosis not present

## 2019-01-18 DIAGNOSIS — Z952 Presence of prosthetic heart valve: Secondary | ICD-10-CM

## 2019-01-18 DIAGNOSIS — R911 Solitary pulmonary nodule: Secondary | ICD-10-CM

## 2019-01-18 DIAGNOSIS — I4811 Longstanding persistent atrial fibrillation: Secondary | ICD-10-CM

## 2019-01-18 MED ORDER — AMOXICILLIN 500 MG PO TABS: ORAL_TABLET | ORAL | 11 refills | Status: DC

## 2019-01-18 NOTE — Patient Instructions (Addendum)
Medication Instructions:  1) You may STOP ASPIRIN on June 08, 2019  Labwork: None  Follow-Up: You are scheduled with Dr. Marlou Porch on May 04, 2019 at 1:20PM.  You are scheduled for your one year TAVR echo and office visit on December 06, 2019. Please arrive by 12:45PM.

## 2019-01-19 ENCOUNTER — Encounter: Payer: Self-pay | Admitting: Surgery

## 2019-01-19 ENCOUNTER — Ambulatory Visit (INDEPENDENT_AMBULATORY_CARE_PROVIDER_SITE_OTHER): Payer: Medicare Other | Admitting: Surgery

## 2019-01-19 ENCOUNTER — Encounter: Payer: Self-pay | Admitting: *Deleted

## 2019-01-19 ENCOUNTER — Other Ambulatory Visit: Payer: Self-pay | Admitting: *Deleted

## 2019-01-19 VITALS — BP 126/82 | HR 99 | Temp 97.7°F | Resp 15 | Ht 69.0 in | Wt 178.0 lb

## 2019-01-19 DIAGNOSIS — R918 Other nonspecific abnormal finding of lung field: Secondary | ICD-10-CM

## 2019-01-19 DIAGNOSIS — R911 Solitary pulmonary nodule: Secondary | ICD-10-CM

## 2019-01-19 NOTE — Progress Notes (Signed)
HPI:  The patient returns today to review the results of his recent mediastinoscopy with lymph node biopsies performed for work-up of a 2.4 x 2.3 cm macrolobulated and slightly spiculated lung mass in the anterior aspect of the lingula that is hypermetabolic on PET scan and suspicious for lung cancer.  All of the lymph node biopsies from the mediastinum were negative for malignancy.  Pulmonary function testing was better than expected showing no significant obstruction or restriction with minimal decrease in diffusion capacity.  He said that he has continued to smoke a little since surgery.  He has done well since his TAVR and is ambulating without shortness of breath or chest pain.  Current Outpatient Medications  Medication Sig Dispense Refill  . amoxicillin (AMOXIL) 500 MG tablet Take 2,000 mg (four tablets) 1 hour prior to all dental visits. There are enough tablets for 2 dental visits in this bottle. 8 tablet 11  . aspirin 81 MG chewable tablet Chew 1 tablet (81 mg total) by mouth daily.    . dorzolamide (TRUSOPT) 2 % ophthalmic solution Place 1 drop into both eyes 2 (two) times daily.     Marland Kitchen esomeprazole (NEXIUM) 40 MG capsule Take 40 mg by mouth daily.     Marland Kitchen glimepiride (AMARYL) 2 MG tablet Take 2 mg by mouth daily.    . metFORMIN (GLUCOPHAGE-XR) 500 MG 24 hr tablet Take 1,000 mg by mouth every evening.     . metoprolol succinate (TOPROL-XL) 25 MG 24 hr tablet Take 1 tablet (25 mg total) by mouth daily.    . Multiple Vitamins-Minerals (MULTIVITAMIN WITH MINERALS) tablet Take 1 tablet by mouth daily.    Marland Kitchen oxyCODONE-acetaminophen (PERCOCET) 5-325 MG tablet Take 1 tablet by mouth every 4 (four) hours as needed for severe pain. 10 tablet 0  . simvastatin (ZOCOR) 40 MG tablet Take 40 mg by mouth at bedtime.      Alveda Reasons 20 MG TABS tablet TAKE 1 TABLET BY MOUTH  DAILY WITH SUPPER (Patient taking differently: Take 20 mg by mouth daily with supper. ) 90 tablet 1   No current  facility-administered medications for this visit.      Physical Exam: BP 126/82 (BP Location: Left Arm, Patient Position: Sitting, Cuff Size: Normal)   Pulse 99   Temp 97.7 F (36.5 C)   Resp 15   Ht 5\' 9"  (1.753 m)   Wt 178 lb (80.7 kg)   SpO2 95% Comment: RA  BMI 26.29 kg/m  He looks well. The neck incision is healing well. Lungs are clear. Cardiac exam shows a regular rate and rhythm with normal heart sounds.  There is no murmur.   Impression:  He has a 2.4 cm mass in the lingula with some mildly enlarged periaortic and mediastinal lymph nodes.  None of the lymph nodes were hypermetabolic on PET scan.  All of the mediastinal lymph node biopsies were negative for malignancy.  His pulmonary function is adequate to allow a wedge resection or right upper lobectomy if needed.  I think surgical resection is indicated for his lung mass which is most likely a lung cancer.  I have reviewed the CT images with him again and answered all of his questions.  I discussed the alternatives for treatment including wedge resection if possible and lobectomy if needed.  I discussed the benefits and risks of surgery including but not limited to bleeding, blood transfusion, infection, prolonged air leak, the possibility that he may require adjuvant therapy if  there is any evidence of lymph node spread at the time of surgery, and recurrent cancer.  He understands all this and agrees to proceed.  Plan:  He will be scheduled for left video-assisted thoracoscopy and possible thoracotomy for wedge resection or left upper lobectomy on Thursday, 02/02/2019.  I spent 10 minutes performing this established patient evaluation and > 50% of this time was spent face to face counseling and coordinating the care of this patient's left upper lobe lung mass.    Gaye Pollack, MD [facility] [phone]

## 2019-01-24 DIAGNOSIS — E119 Type 2 diabetes mellitus without complications: Secondary | ICD-10-CM | POA: Diagnosis not present

## 2019-01-24 DIAGNOSIS — H409 Unspecified glaucoma: Secondary | ICD-10-CM | POA: Diagnosis not present

## 2019-01-24 DIAGNOSIS — I10 Essential (primary) hypertension: Secondary | ICD-10-CM | POA: Diagnosis not present

## 2019-01-24 DIAGNOSIS — E139 Other specified diabetes mellitus without complications: Secondary | ICD-10-CM | POA: Diagnosis not present

## 2019-01-24 DIAGNOSIS — E1169 Type 2 diabetes mellitus with other specified complication: Secondary | ICD-10-CM | POA: Diagnosis not present

## 2019-01-24 DIAGNOSIS — I480 Paroxysmal atrial fibrillation: Secondary | ICD-10-CM | POA: Diagnosis not present

## 2019-01-24 DIAGNOSIS — E7849 Other hyperlipidemia: Secondary | ICD-10-CM | POA: Diagnosis not present

## 2019-01-24 DIAGNOSIS — E7800 Pure hypercholesterolemia, unspecified: Secondary | ICD-10-CM | POA: Diagnosis not present

## 2019-01-24 DIAGNOSIS — C61 Malignant neoplasm of prostate: Secondary | ICD-10-CM | POA: Diagnosis not present

## 2019-01-27 DIAGNOSIS — L259 Unspecified contact dermatitis, unspecified cause: Secondary | ICD-10-CM | POA: Diagnosis not present

## 2019-01-30 ENCOUNTER — Other Ambulatory Visit: Payer: Self-pay

## 2019-01-30 ENCOUNTER — Ambulatory Visit (HOSPITAL_COMMUNITY)
Admission: RE | Admit: 2019-01-30 | Discharge: 2019-01-30 | Disposition: A | Discharge status: Home / Self Care | Payer: Medicare Other | Source: Ambulatory Visit | Attending: Surgery | Admitting: Surgery

## 2019-01-30 ENCOUNTER — Encounter (HOSPITAL_COMMUNITY): Payer: Self-pay

## 2019-01-30 ENCOUNTER — Other Ambulatory Visit (HOSPITAL_COMMUNITY)
Admission: RE | Admit: 2019-01-30 | Discharge: 2019-01-30 | Disposition: A | Discharge status: Home / Self Care | Payer: Medicare Other | Source: Ambulatory Visit | Attending: Surgery | Admitting: Surgery

## 2019-01-30 ENCOUNTER — Encounter (HOSPITAL_COMMUNITY)
Admission: RE | Admit: 2019-01-30 | Discharge: 2019-01-30 | Disposition: A | Discharge status: Home / Self Care | Payer: Medicare Other | Source: Ambulatory Visit | Attending: Surgery | Admitting: Surgery

## 2019-01-30 DIAGNOSIS — Z01818 Encounter for other preprocedural examination: Secondary | ICD-10-CM | POA: Insufficient documentation

## 2019-01-30 DIAGNOSIS — F419 Anxiety disorder, unspecified: Secondary | ICD-10-CM | POA: Diagnosis not present

## 2019-01-30 DIAGNOSIS — Z20828 Contact with and (suspected) exposure to other viral communicable diseases: Secondary | ICD-10-CM | POA: Diagnosis not present

## 2019-01-30 DIAGNOSIS — I517 Cardiomegaly: Secondary | ICD-10-CM | POA: Diagnosis not present

## 2019-01-30 DIAGNOSIS — E785 Hyperlipidemia, unspecified: Secondary | ICD-10-CM | POA: Diagnosis not present

## 2019-01-30 DIAGNOSIS — C3412 Malignant neoplasm of upper lobe, left bronchus or lung: Secondary | ICD-10-CM | POA: Diagnosis not present

## 2019-01-30 DIAGNOSIS — R918 Other nonspecific abnormal finding of lung field: Secondary | ICD-10-CM | POA: Insufficient documentation

## 2019-01-30 DIAGNOSIS — K219 Gastro-esophageal reflux disease without esophagitis: Secondary | ICD-10-CM | POA: Diagnosis not present

## 2019-01-30 DIAGNOSIS — R911 Solitary pulmonary nodule: Secondary | ICD-10-CM | POA: Diagnosis not present

## 2019-01-30 DIAGNOSIS — E1151 Type 2 diabetes mellitus with diabetic peripheral angiopathy without gangrene: Secondary | ICD-10-CM | POA: Diagnosis not present

## 2019-01-30 DIAGNOSIS — J449 Chronic obstructive pulmonary disease, unspecified: Secondary | ICD-10-CM | POA: Diagnosis not present

## 2019-01-30 DIAGNOSIS — I4819 Other persistent atrial fibrillation: Secondary | ICD-10-CM | POA: Diagnosis not present

## 2019-01-30 DIAGNOSIS — J322 Chronic ethmoidal sinusitis: Secondary | ICD-10-CM | POA: Diagnosis not present

## 2019-01-30 DIAGNOSIS — I10 Essential (primary) hypertension: Secondary | ICD-10-CM | POA: Insufficient documentation

## 2019-01-30 DIAGNOSIS — E119 Type 2 diabetes mellitus without complications: Secondary | ICD-10-CM | POA: Insufficient documentation

## 2019-01-30 DIAGNOSIS — F17290 Nicotine dependence, other tobacco product, uncomplicated: Secondary | ICD-10-CM | POA: Insufficient documentation

## 2019-01-30 DIAGNOSIS — I2510 Atherosclerotic heart disease of native coronary artery without angina pectoris: Secondary | ICD-10-CM | POA: Insufficient documentation

## 2019-01-30 DIAGNOSIS — D62 Acute posthemorrhagic anemia: Secondary | ICD-10-CM | POA: Diagnosis not present

## 2019-01-30 HISTORY — DX: Pneumonia, unspecified organism: J18.9

## 2019-01-30 HISTORY — DX: Cardiac arrhythmia, unspecified: I49.9

## 2019-01-30 LAB — COMPREHENSIVE METABOLIC PANEL
ALT: 16 U/L (ref 0–44)
AST: 22 U/L (ref 15–41)
Albumin: 4 g/dL (ref 3.5–5.0)
Alkaline Phosphatase: 65 U/L (ref 38–126)
Anion gap: 9 (ref 5–15)
BUN: 18 mg/dL (ref 8–23)
CO2: 20 mmol/L — ABNORMAL LOW (ref 22–32)
Calcium: 9.2 mg/dL (ref 8.9–10.3)
Chloride: 108 mmol/L (ref 98–111)
Creatinine, Ser: 0.86 mg/dL (ref 0.61–1.24)
GFR calc Af Amer: >60 mL/min (ref >60)
GFR calc non Af Amer: >60 mL/min (ref >60)
Glucose, Bld: 154 mg/dL — ABNORMAL HIGH (ref 70–99)
Potassium: 3.7 mmol/L (ref 3.5–5.1)
Sodium: 137 mmol/L (ref 135–145)
Total Bilirubin: 0.8 mg/dL (ref 0.3–1.2)
Total Protein: 6.8 g/dL (ref 6.5–8.1)

## 2019-01-30 LAB — CBC
HCT: 37.9 % — ABNORMAL LOW (ref 39.0–52.0)
Hemoglobin: 12.1 g/dL — ABNORMAL LOW (ref 13.0–17.0)
MCH: 30.6 pg (ref 26.0–34.0)
MCHC: 31.9 g/dL (ref 30.0–36.0)
MCV: 95.7 fL (ref 80.0–100.0)
Platelets: 180 10*3/uL (ref 150–400)
RBC: 3.96 MIL/uL — ABNORMAL LOW (ref 4.22–5.81)
RDW: 13.9 % (ref 11.5–15.5)
WBC: 6.6 10*3/uL (ref 4.0–10.5)
nRBC: 0 % (ref 0.0–0.2)

## 2019-01-30 LAB — BLOOD GAS, ARTERIAL
Acid-base deficit: 0.7 mmol/L (ref 0.0–2.0)
Bicarbonate: 23.3 mmol/L (ref 20.0–28.0)
Drawn by: 42180
O2 Saturation: 98.1 %
Patient temperature: 98.6
pCO2 arterial: 37.7 mmHg (ref 32.0–48.0)
pH, Arterial: 7.408 (ref 7.350–7.450)
pO2, Arterial: 111 mmHg — ABNORMAL HIGH (ref 83.0–108.0)

## 2019-01-30 LAB — URINALYSIS, ROUTINE W REFLEX MICROSCOPIC
Bilirubin Urine: NEGATIVE
Glucose, UA: NEGATIVE mg/dL
Hgb urine dipstick: NEGATIVE
Ketones, ur: NEGATIVE mg/dL
Leukocytes,Ua: NEGATIVE
Nitrite: NEGATIVE
Protein, ur: NEGATIVE mg/dL
Specific Gravity, Urine: 1.016 (ref 1.005–1.030)
pH: 5 (ref 5.0–8.0)

## 2019-01-30 LAB — GLUCOSE, CAPILLARY: Glucose-Capillary: 181 mg/dL — ABNORMAL HIGH (ref 70–99)

## 2019-01-30 LAB — PROTIME-INR
INR: 1.2 (ref 0.8–1.2)
Prothrombin Time: 15.3 seconds — ABNORMAL HIGH (ref 11.4–15.2)

## 2019-01-30 LAB — APTT: aPTT: 32 seconds (ref 24–36)

## 2019-01-30 LAB — HEMOGLOBIN A1C
Hgb A1c MFr Bld: 7.1 % — ABNORMAL HIGH (ref 4.8–5.6)
Mean Plasma Glucose: 157.07 mg/dL

## 2019-01-30 LAB — TYPE AND SCREEN
ABO/RH(D): O POS
Antibody Screen: NEGATIVE

## 2019-01-30 IMAGING — CR DG CHEST 2V
2 series · 2 of 2 positions shown · non-contrast
Comparison: [DATE] and prior studies

CLINICAL DATA: Preoperative chest radiograph prior to lung surgery.

EXAM:
CHEST - 2 VIEW

[w chest pa]
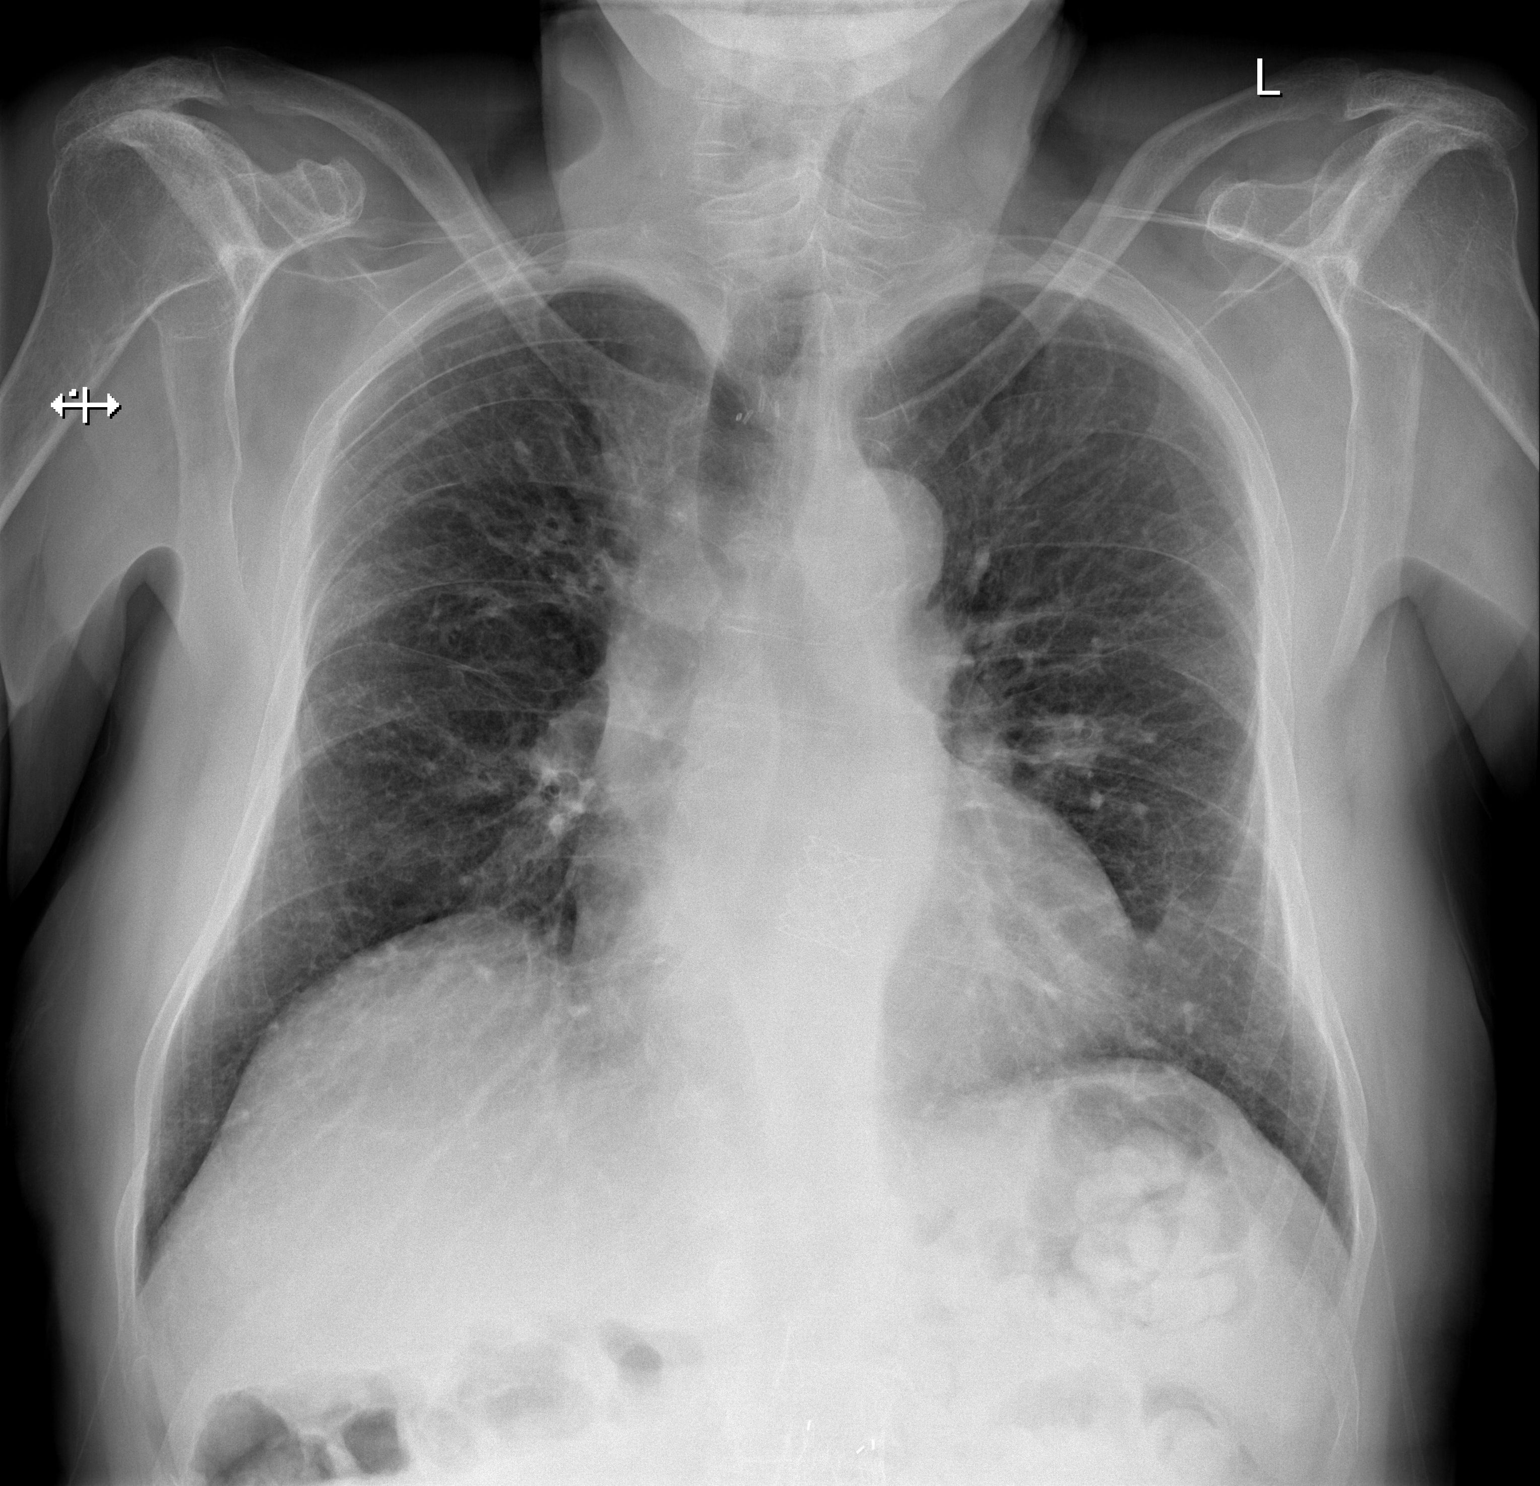

[w chest lat]
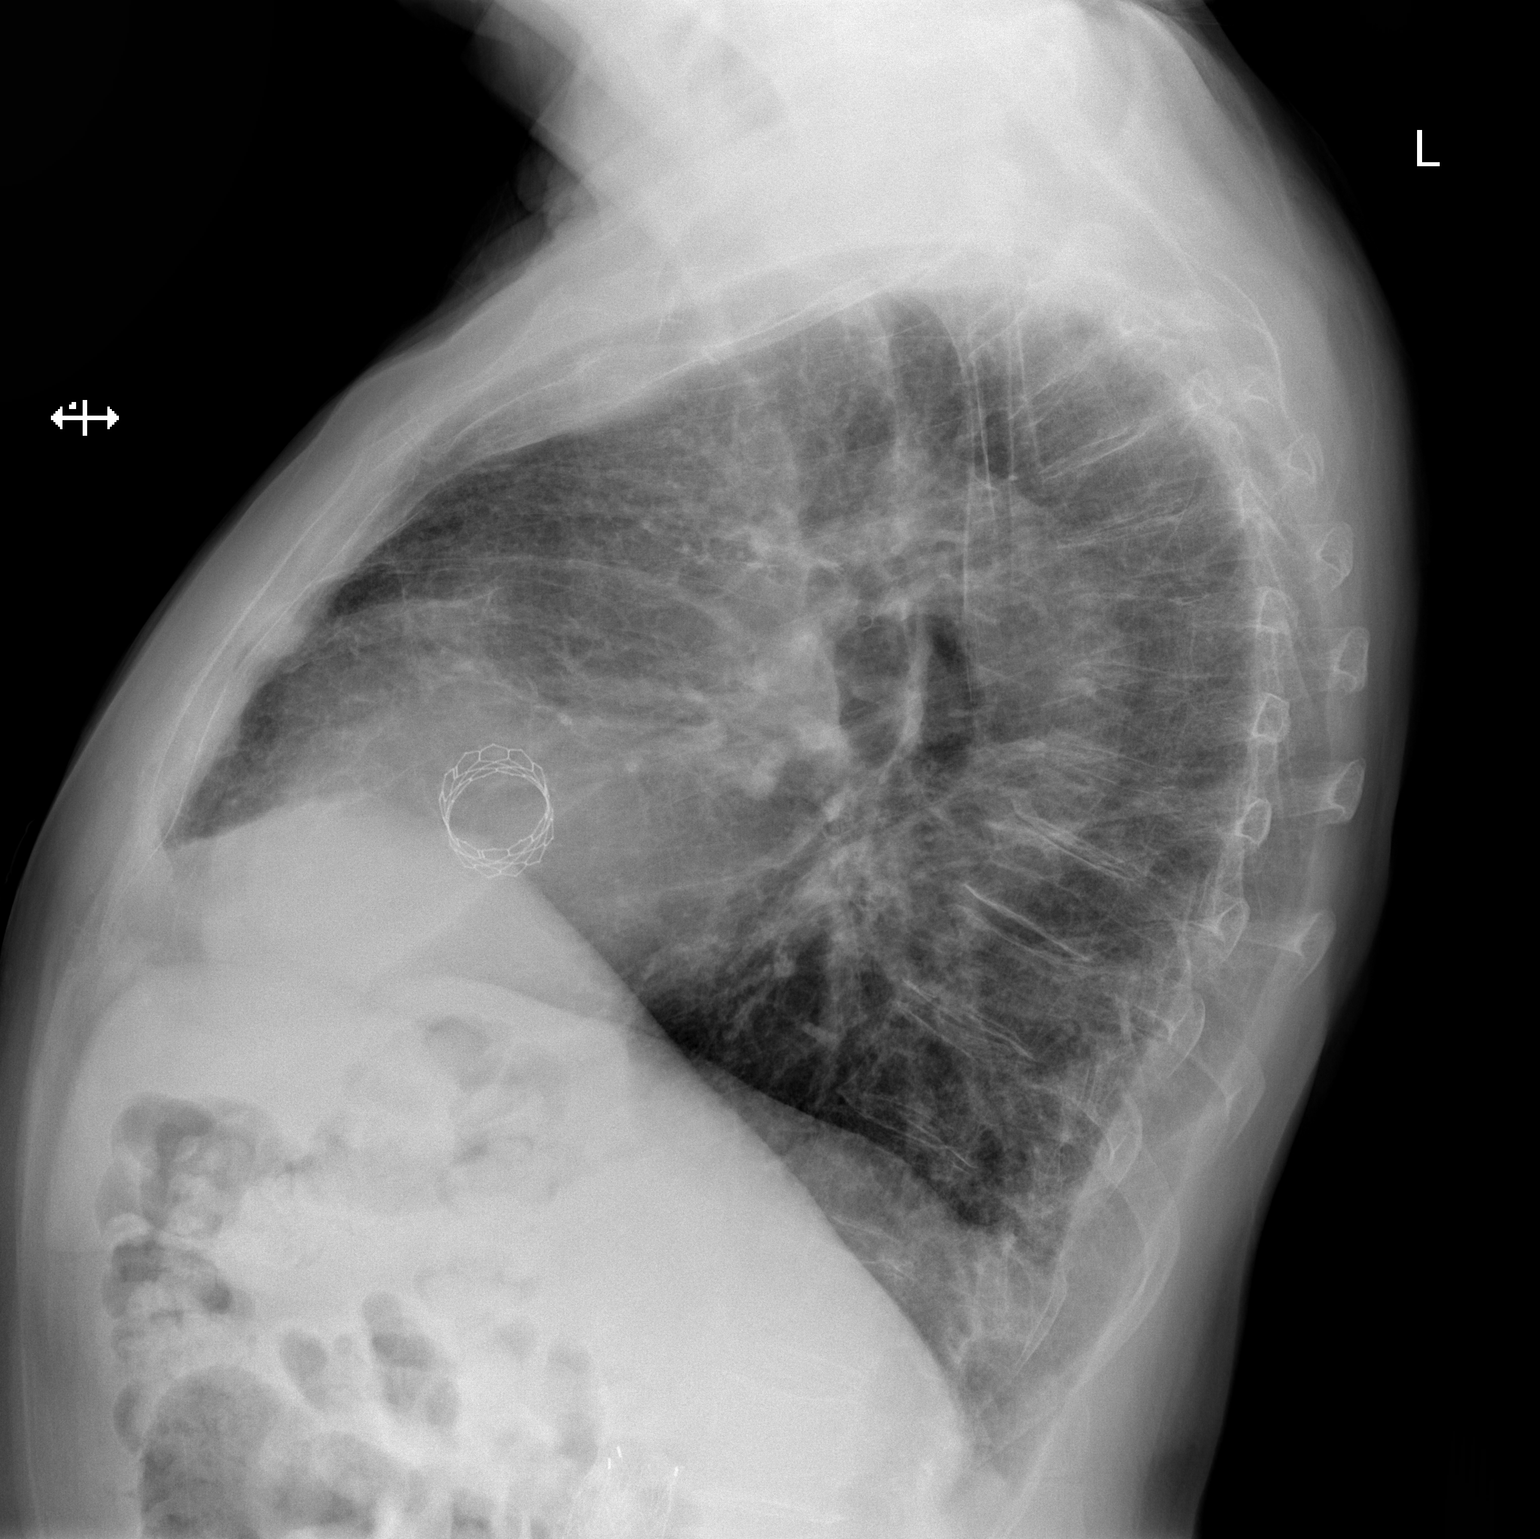

[2 of 2 positions shown; findings below may reference images not displayed]

FINDINGS: LEFT lung nodule again identified.

Cardiomegaly, aortic valve replacement and mild chronic
peribronchial thickening again noted.

No airspace disease, pleural effusion or pneumothorax.

No acute bony abnormalities noted.
IMPRESSION: LEFT lung nodule again identified. Cardiomegaly without evidence of
acute abnormality.

## 2019-01-30 NOTE — Pre-Procedure Instructions (Addendum)
Vincent Velez  01/30/2019    Your procedure is scheduled on Thursday, September 3.  Report to [facility] Main Entrance or Entrance "A" at 5:30 AM                   Your surgery or procedure is scheduled for 7:30 A.M.   Call this number if you have problems the morning of surgery:[phone]  This is the number for the Pre- Surgical Desk.  For any other questions, please call [phone], Monday - Friday 8 AM - 4 PM.  NO Smoking within 24 hours of surgery    Remember:  Do not eat or drink after midnight Wednesday, September 2  DO NOT take medications for Diabetes the morning of surgery.   Take these medicines the morning of surgery with A SIP OF WATER :  esomeprazole (NEXIUM)              metoprolol succinate (TOPROL-XL)             dorzolamide (TRUSOPT) eye drops                Take if needed:oxyCODONE-acetaminophen (PERCOCET)               Follow Dr Vivi Martens instructions regarding XARELTO and Aspirin  How to Manage Your Diabetes Before and After Surgery  Why is it important to control my blood sugar before and after surgery? . Improving blood sugar levels before and after surgery helps healing and can limit problems. . A way of improving blood sugar control is eating a healthy diet by: o  Eating less sugar and carbohydrates o  Increasing activity/exercise o  Talking with your doctor about reaching your blood sugar goals . High blood sugars (greater than 180 mg/dL) can raise your risk of infections and slow your recovery, so you will need to focus on controlling your diabetes during the weeks before surgery. . Make sure that the doctor who takes care of your diabetes knows about your planned surgery including the date and location.  How do I manage my blood sugar before surgery? . Check your blood sugar at least 4 times a day, starting 2 days before surgery, to make sure that the level is not too high or low. o Check your blood sugar the morning of your  surgery when you wake up and every 2 hours until you get to the Short Stay unit. . If your blood sugar is less than 70 mg/dL, you will need to treat for low blood sugar: o Do not take insulin. o Treat a low blood sugar (less than 70 mg/dL) with  cup of clear juice (cranberry or apple), 4 glucose tablets, OR glucose gel. Recheck blood sugar in 15 minutes after treatment (to make sure it is greater than 70 mg/dL). If your blood sugar is not greater than 70 mg/dL on recheck, call [phone] o  for further instructions. . Report your blood sugar to the short stay nurse when you get to Short Stay.  . If you are admitted to the hospital after surgery: o Your blood sugar will be checked by the staff and you will probably be given insulin after surgery (instead of oral diabetes medicines) to make sure you have good blood sugar levels. o The goal for blood sugar control after surgery is 80-180 mg/dL    WHAT DO I DO ABOUT MY DIABETES MEDICATION? Do not take oral diabetes medicines (pills) the morning of surgery.  Special instructions:  [place]- Preparing For Surgery  Before surgery, you can play an important role. Because skin is not sterile, your skin needs to be as free of germs as possible. You can reduce the number of germs on your skin by washing with CHG (chlorahexidine gluconate) Soap before surgery.  CHG is an antiseptic cleaner which kills germs and bonds with the skin to continue killing germs even after washing.    Oral Hygiene is also important to reduce your risk of infection.  Remember - BRUSH YOUR TEETH THE MORNING OF SURGERY WITH YOUR REGULAR TOOTHPASTE  Please do not use if you have an allergy to CHG or antibacterial soaps. If your skin becomes reddened/irritated stop using the CHG.  Do not shave (including legs and underarms) for at least 48 hours prior to first CHG shower. It is OK to shave your face.  Please follow these instructions carefully.   1. Shower the NIGHT  BEFORE SURGERY and the MORNING OF SURGERY with CHG.   2. If you chose to wash your hair, wash your hair first as usual with your normal shampoo.  3. After you shampoo, wash your face and private area with the soap you use at home, then rinse your hair and body thoroughly to remove the shampoo and soap.   4. Use CHG as you would any other liquid soap. You can apply CHG directly to the skin and wash gently with a scrungie or a clean washcloth.   5. Apply the CHG Soap to your body ONLY FROM THE NECK DOWN.  Do not use on open wounds or open sores. Avoid contact with your eyes, ears, mouth and genitals (private parts). Wash Face and genitals (private parts)  with your normal soap.  6. Wash thoroughly, paying special attention to the area where your surgery will be performed.  7. Thoroughly rinse your body with warm water from the neck down.  8. DO NOT shower/wash with your normal soap after using and rinsing off the CHG Soap.  9. Pat yourself dry with a CLEAN TOWEL.  10. Wear CLEAN PAJAMAS to bed the night before surgery, wear comfortable clothes the morning of surgery  11. Place CLEAN SHEETS on your bed the night of your first shower and DO NOT SLEEP WITH PETS.  Day of Surgery: Shower as instructed above. Do not apply any deodorants/lotions, powders or colognes  Please wear clean clothes to the hospital/surgery center.   Remember to brush your teeth WITH YOUR REGULAR TOOTHPASTE.  Do not wear jewelry, make-up or nail polish.  Men may shave face and neck.  Do not bring valuables to the hospital.  [facility] is not responsible for any belongings or valuables.  Contacts, dentures or bridgework may not be worn into surgery.  Leave your suitcase in the car.  After surgery it may be brought to your room.  For patients admitted to the hospital, discharge time will be determined by your treatment team.  Patients discharged the day of surgery will not be allowed to drive home.   Please read  over the following fact sheets that you were given:. Pain Booklet, Patient Instructions for Mupirocin Application,Coughing, Surgical Site Infections.

## 2019-01-30 NOTE — Progress Notes (Signed)
PCP - Dr. Delfina Redwood  Cardiologist - Dr Marlou Porch  Chest x-ray - 01/30/2019  EKG - 01/30/2019  Stress Test -   ECHO -   Cardiac Cath -   Sleep Study - no CPAP - no  LABS-  ASA-81- I spoke with Levonne Spiller, RN - she said to continue, just not take the am of surgery.  ERAS-no  HA1C- Fasting Blood Sugar - 120's Checks Blood Sugar _1____ times a day  Anesthesia-  Pt denies having chest pain, sob, or fever at this time. All instructions explained to the pt, with a verbal understanding of the material. Pt agrees to go over the instructions while at home for a better understanding. Pt also instructed to self quarantine after being tested for COVID-19. The opportunity to ask questions was provided.

## 2019-01-31 LAB — SARS CORONAVIRUS 2 (TAT 6-24 HRS): SARS Coronavirus 2: NEGATIVE

## 2019-02-01 NOTE — H&P (Signed)
[address]       [place] [address]             [phone]      Cardiothoracic Surgery Admission History and Physical   Vincent Velez is an 78 y.o. male.   Chief Complaint: Left upper lobe mass HPI:   The patient is a 78 year old gentleman with a history of hypertension, hyperlipidemia, diabetes, ongoing smoking with COPD, persistent atrial fibrillation on anticoagulation, abdominal aortic aneurysm s/p EVAR and moderate aortic stenosisthat was successfully treated with TAVR on 12/06/2018.ThepreoperativePET scan shows a 2.4 x 2.3 cm macrolobulated and slightly spiculated lung mass in the anterior aspect of the lingula that is suspicious for lung cancer. There are borderline enlarged lymph nodes in the anterior mediastinum and aortopulmonary window which have low-grade hypermetabolic activity which is similar to or below the blood pool activity and therefore not diagnostic of metastatic carcinoma.  He subsequently underwent flexible bronchoscopy and mediastinoscopy with lymph node biopsies.  All the lymph node biopsies from the mediastinum were negative for metastatic carcinoma.  The lesion is not an ideal location for CT-guided needle biopsy.  His pulmonary function showed adequate lung function to allow wedge resection or left upper lobectomy so I thought the best option was to proceed with surgical resection.  Past Medical History:  Diagnosis Date  . AAA (abdominal aortic aneurysm) ([place])   . Anxiety   . Arthritis   . Asthma    when younger  . Atrial fibrillation ([place])   . CAD (coronary artery disease)   . Cataract    removed both  . COPD (chronic obstructive pulmonary disease) ([place])   . Diabetes mellitus    Type II  . Dysrhythmia    afib  . Esophageal reflux   . Family history of malignant neoplasm of gastrointestinal tract   . Heart murmur   . Hiatal hernia   . Hyperlipemia   . Hypertension   . Lung nodule    a. PET scan highly suspicious for  malignancy. Bronch with biopsy to be done after TAVR  . Malignant neoplasm of prostate ([place])    prostate   . Peripheral vascular disease ([place])   . Pneumonia   . Stricture and stenosis of esophagus     Past Surgical History:  Procedure Laterality Date  . ABDOMINAL AORTA STENT    . CARDIAC CATHETERIZATION    . CARDIOVERSION N/A 12/04/2015   Procedure: CARDIOVERSION;  Surgeon: Sanda Klein, MD;  Location: [facility] END[facility]COPY;  Service: Cardiovascular;  Laterality: N/A;  . COLONOSCOPY    . FINGER SURGERY Right    middle finger- amputation  . INSERTION PROSTATE RADIATION SEED    . MEDIASTINOSCOPY N/A 01/06/2019   Procedure: MEDIASTINOSCOPY WITH BIOPIES;  Surgeon: Gaye Pollack, MD;  Location: [facility] OR;  Service: Thoracic;  Laterality: N/A;  . RIGHT/LEFT HEART CATH AND CORONARY ANGIOGRAPHY N/A 10/26/2018   Procedure: RIGHT/LEFT HEART CATH AND CORONARY ANGIOGRAPHY;  Surgeon: Burnell Blanks, MD;  Location: [place] [facility] LAB;  Service: Cardiovascular;  Laterality: N/A;  . TEE WITHOUT CARDIOVERSION N/A 12/06/2018   Procedure: TRANSESOPHAGEAL ECHOCARDIOGRAM (TEE);  Surgeon: Burnell Blanks, MD;  Location: [place] CV LAB;  Service: Open Heart Surgery;  Laterality: N/A;  . TRANSCATHETER AORTIC VALVE REPLACEMENT, TRANSFEMORAL N/A 12/06/2018   Procedure: TRANSCATHETER AORTIC VALVE REPLACEMENT, TRANSFEMORAL;  Surgeon: Burnell Blanks, MD;  Location: [place] CV LAB;  Service: Open Heart Surgery;  Laterality: N/A;  . UPPER  GASTROINTESTINAL ENDOSCOPY    . VIDEO BRONCHOSCOPY N/A 01/06/2019   Procedure: VIDEO BRONCHOSCOPY;  Surgeon: Gaye Pollack, MD;  Location: [facility] OR;  Service: Thoracic;  Laterality: N/A;    Family History  Problem Relation Age of Onset  . Colon cancer Father        questionable  . Diabetes Mother   . Heart disease Mother        Heart Disease before age 28  . Deep vein thrombosis Mother   . Hyperlipidemia Mother   . Hypertension Mother   . Varicose Veins  Mother   . Hypertension Brother   . Heart attack Brother   . Prostate cancer Brother   . Colon polyps Neg Hx   . Esophageal cancer Neg Hx   . Rectal cancer Neg Hx   . Stomach cancer Neg Hx    Social History:  reports that he has been smoking pipe and cigars. He has never used smokeless tobacco. He reports previous alcohol use. He reports that he does not use drugs.  Allergies: No Known Allergies  No medications prior to admission.    No results found for this or any previous visit (from the past 48 hour(s)). No results found.  Review of Systems  Constitutional: Negative.   HENT: Negative.   Eyes: Negative.   Respiratory: Positive for cough. Negative for sputum production and shortness of breath.   Cardiovascular: Negative.   Gastrointestinal: Negative.   Genitourinary: Negative.   Musculoskeletal: Negative.   Skin: Negative.   Neurological: Negative.   Endo/Heme/Allergies: Negative.   Psychiatric/Behavioral: Negative.     There were no vitals taken for this visit. Physical Exam  Constitutional: He is oriented to person, place, and time. He appears well-developed and well-nourished. No distress.  HENT:  Head: Normocephalic and atraumatic.  Mouth/Throat: Oropharynx is clear and Velez.  Eyes: Pupils are equal, round, and reactive to light. Conjunctivae and EOM are normal.  Neck: Normal range of motion. Neck supple. No JVD present.  Cardiovascular: Normal rate and regular rhythm.  Murmur heard. Respiratory: Effort normal and breath sounds normal. No respiratory distress.  GI: Soft. Bowel sounds are normal. He exhibits no distension. There is no abdominal tenderness.  Musculoskeletal: Normal range of motion.        General: No edema.  Lymphadenopathy:    He has no cervical adenopathy.  Neurological: He is alert and oriented to person, place, and time. He has normal strength. No cranial nerve deficit or sensory deficit.  Skin: Skin is warm and dry.  Psychiatric: He has a  normal mood and affect. His behavior is normal.    CLINICAL DATA:  Initial treatment strategy for solitary pulmonary nodule.  EXAM: NUCLEAR MEDICINE PET SKULL BASE TO THIGH  TECHNIQUE: 9.4 mCi F-18 FDG was injected intravenously. Full-ring PET imaging was performed from the skull base to thigh after the radiotracer. CT data was obtained and used for attenuation correction and anatomic localization.  Fasting blood glucose: 146 mg/dl  COMPARISON:  10/31/2026  FINDINGS: Mediastinal blood pool activity: SUV max 2.8  Liver activity: SUV max NA  NECK: No significant abnormal hypermetabolic activity in this region.  Incidental CT findings: Mild chronic right ethmoid sinusitis.  CHEST: The 2.6 by 2.2 cm left upper lobe nodule has a maximum SUV of 4.7  AP window lymph node 0.9 cm in short axis on image 61/4, maximum SUV 2.3.  Right lower paratracheal node 0.9 cm in short axis on image 61/4, maximum SUV 2.9.  Incidental  CT findings: Coronary, aortic arch, and branch vessel atherosclerotic vascular disease. Mild cardiomegaly. Calcifications of the aortic valve.  ABDOMEN/PELVIS: Scattered accentuated activity in bowel, likely physiologic. 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  Incidental CT findings: Aortoiliac atherosclerotic vascular disease. Aortoiliac stent graft traversing infrarenal abdominal aortic aneurysm. Prominent stool throughout the colon favors constipation. Prostate brachytherapy seeds noted.  SKELETON: No significant abnormal hypermetabolic activity in this region.  Incidental CT findings: Thoracic kyphosis. Mild levoconvex lumbar scoliosis with rotary component. Calcifications in the soft tissues of the right inferior buttock, probably from heterotopic ossification or dystrophic  calcification.  IMPRESSION: 1. The anterior left upper lobe nodule is hypermetabolic with SUV of 4.7, highly suspicious for malignancy. 2. Generally low-grade activity in the upper normal size mediastinal lymph nodes, similar to or below the blood pool activity. 3. Other imaging findings of potential clinical significance: Aortic Atherosclerosis (ICD10-I70.0). Coronary atherosclerosis. Mild cardiomegaly. Aortic valve calcification. 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 Prominent stool throughout the colon favors constipation. Aortoiliac stent graft.   Electronically Signed   By: Van Clines M.D.   On: 11/10/2018 14:45   Pulmonary function test Order: [id number] Status:  Edited Result - FINAL Visible to patient:  No (not released) Next appt:  05/04/2019 at 01:20 PM in Cardiology Candee Furbish, MD) Dx:  Nodule of upper lobe of left lung  Ref Range & Units 3wk ago  FVC-Pre L 3.82   FVC-%Pred-Pre % 94   FVC-Post L 3.79   FVC-%Pred-Post % 94   FVC-%Change-Post % 0   FEV1-Pre L 3.27   FEV1-%Pred-Pre % 112   FEV1-Post L 3.31   FEV1-%Pred-Post % 114   FEV1-%Change-Post % 1   FEV6-Pre L 3.81   FEV6-%Pred-Pre % 101   FEV6-Post L 3.79   FEV6-%Pred-Post % 100   FEV6-%Change-Post % 0   Pre FEV1/FVC ratio % 86   FEV1FVC-%Pred-Pre % 118   Post FEV1/FVC ratio % 87   FEV1FVC-%Change-Post % 1   Pre FEV6/FVC Ratio % 100   FEV6FVC-%Pred-Pre % 106   Post FEV6/FVC ratio % 100   FEV6FVC-%Pred-Post % 106   FEV6FVC-%Change-Post % 0   FEF 25-75 Pre L/sec 4.43   FEF2575-%Pred-Pre % 212   FEF 25-75 Post L/sec 4.66   FEF2575-%Pred-Post % 223   FEF2575-%Change-Post % 5   RV L 2.07   RV % pred % 82   TLC L 6.00   TLC % pred % 87   DLCO unc ml/min/mmHg 18.06   DLCO unc % pred % 74   DLCO cor ml/min/mmHg 18.66   DLCO cor % pred % 77   DL/VA ml/min/mmHg/L 3.84   DL/VA % pred % 96   Resulting Agency  [facility]      Specimen Collected: 01/10/19 09:57  Last Resulted: 01/10/19 11:09        Assessment/Plan   The patient has a 2.6 x 2.2 cm mass in the left upper lobe with a maximum SUV of 4.7 that is highly suspicious for bronchogenic carcinoma.  He has some mediastinal adenopathy which was biopsied on mediastinoscopy and all the biopsies were negative for metastatic carcinoma.  His pulmonary function testing is adequate to allow wedge resection or left upper lobectomy if needed for surgical resection of this lesion.  I discussed the procedure of left video-assisted thoracoscopy or thoracotomy  for surgical resection with the patient.  I discussed this the benefits and risk of surgery including but not limited to bleeding, blood transfusion, infection, prolonged air leak from the lung, postoperative respiratory insufficiency, and recurrent cancer.  He understands all this and agrees to proceed.  We will proceed with left thoracoscopy or thoracotomy for wedge resection of the left upper lobe lung mass or left upper lobectomy as needed.   Gaye Pollack, MD 02/01/2019, 5:04 PM

## 2019-02-02 ENCOUNTER — Encounter (HOSPITAL_COMMUNITY): Payer: Self-pay | Admitting: *Deleted

## 2019-02-02 ENCOUNTER — Inpatient Hospital Stay (HOSPITAL_COMMUNITY): Payer: Medicare Other

## 2019-02-02 ENCOUNTER — Encounter (HOSPITAL_COMMUNITY)
Admission: RE | Disposition: A | Discharge status: Home / Self Care | Payer: Self-pay | Source: Home / Self Care | Attending: Surgery

## 2019-02-02 ENCOUNTER — Inpatient Hospital Stay (HOSPITAL_COMMUNITY): Payer: Medicare Other | Admitting: Certified Registered"

## 2019-02-02 ENCOUNTER — Other Ambulatory Visit: Payer: Self-pay

## 2019-02-02 ENCOUNTER — Inpatient Hospital Stay (HOSPITAL_COMMUNITY)
Admission: RE | Admit: 2019-02-02 | Discharge: 2019-02-06 | DRG: 167 | Disposition: A | Discharge status: Home / Self Care | Payer: Medicare Other | Attending: Surgery | Admitting: Surgery

## 2019-02-02 DIAGNOSIS — D62 Acute posthemorrhagic anemia: Secondary | ICD-10-CM | POA: Diagnosis not present

## 2019-02-02 DIAGNOSIS — J449 Chronic obstructive pulmonary disease, unspecified: Secondary | ICD-10-CM | POA: Diagnosis present

## 2019-02-02 DIAGNOSIS — F419 Anxiety disorder, unspecified: Secondary | ICD-10-CM | POA: Diagnosis present

## 2019-02-02 DIAGNOSIS — Z8349 Family history of other endocrine, nutritional and metabolic diseases: Secondary | ICD-10-CM

## 2019-02-02 DIAGNOSIS — E785 Hyperlipidemia, unspecified: Secondary | ICD-10-CM | POA: Diagnosis present

## 2019-02-02 DIAGNOSIS — E1151 Type 2 diabetes mellitus with diabetic peripheral angiopathy without gangrene: Secondary | ICD-10-CM | POA: Diagnosis present

## 2019-02-02 DIAGNOSIS — F17290 Nicotine dependence, other tobacco product, uncomplicated: Secondary | ICD-10-CM | POA: Diagnosis present

## 2019-02-02 DIAGNOSIS — Z8679 Personal history of other diseases of the circulatory system: Secondary | ICD-10-CM | POA: Diagnosis not present

## 2019-02-02 DIAGNOSIS — Z7901 Long term (current) use of anticoagulants: Secondary | ICD-10-CM | POA: Diagnosis not present

## 2019-02-02 DIAGNOSIS — J982 Interstitial emphysema: Secondary | ICD-10-CM | POA: Diagnosis not present

## 2019-02-02 DIAGNOSIS — Z833 Family history of diabetes mellitus: Secondary | ICD-10-CM

## 2019-02-02 DIAGNOSIS — Z8042 Family history of malignant neoplasm of prostate: Secondary | ICD-10-CM | POA: Diagnosis not present

## 2019-02-02 DIAGNOSIS — Z20828 Contact with and (suspected) exposure to other viral communicable diseases: Secondary | ICD-10-CM | POA: Diagnosis present

## 2019-02-02 DIAGNOSIS — R0989 Other specified symptoms and signs involving the circulatory and respiratory systems: Secondary | ICD-10-CM | POA: Diagnosis not present

## 2019-02-02 DIAGNOSIS — I10 Essential (primary) hypertension: Secondary | ICD-10-CM | POA: Diagnosis present

## 2019-02-02 DIAGNOSIS — C3412 Malignant neoplasm of upper lobe, left bronchus or lung: Principal | ICD-10-CM | POA: Diagnosis present

## 2019-02-02 DIAGNOSIS — J322 Chronic ethmoidal sinusitis: Secondary | ICD-10-CM | POA: Diagnosis present

## 2019-02-02 DIAGNOSIS — Z8249 Family history of ischemic heart disease and other diseases of the circulatory system: Secondary | ICD-10-CM | POA: Diagnosis not present

## 2019-02-02 DIAGNOSIS — Z4682 Encounter for fitting and adjustment of non-vascular catheter: Secondary | ICD-10-CM

## 2019-02-02 DIAGNOSIS — Z800 Family history of malignant neoplasm of digestive organs: Secondary | ICD-10-CM | POA: Diagnosis not present

## 2019-02-02 DIAGNOSIS — R040 Epistaxis: Secondary | ICD-10-CM | POA: Diagnosis not present

## 2019-02-02 DIAGNOSIS — J984 Other disorders of lung: Secondary | ICD-10-CM | POA: Diagnosis not present

## 2019-02-02 DIAGNOSIS — Z8546 Personal history of malignant neoplasm of prostate: Secondary | ICD-10-CM

## 2019-02-02 DIAGNOSIS — I4819 Other persistent atrial fibrillation: Secondary | ICD-10-CM | POA: Diagnosis present

## 2019-02-02 DIAGNOSIS — R918 Other nonspecific abnormal finding of lung field: Secondary | ICD-10-CM

## 2019-02-02 DIAGNOSIS — Z9689 Presence of other specified functional implants: Secondary | ICD-10-CM

## 2019-02-02 DIAGNOSIS — K219 Gastro-esophageal reflux disease without esophagitis: Secondary | ICD-10-CM | POA: Diagnosis present

## 2019-02-02 DIAGNOSIS — J9811 Atelectasis: Secondary | ICD-10-CM | POA: Diagnosis not present

## 2019-02-02 DIAGNOSIS — K449 Diaphragmatic hernia without obstruction or gangrene: Secondary | ICD-10-CM | POA: Diagnosis present

## 2019-02-02 DIAGNOSIS — I2510 Atherosclerotic heart disease of native coronary artery without angina pectoris: Secondary | ICD-10-CM | POA: Diagnosis present

## 2019-02-02 DIAGNOSIS — Z9889 Other specified postprocedural states: Secondary | ICD-10-CM

## 2019-02-02 DIAGNOSIS — Z952 Presence of prosthetic heart valve: Secondary | ICD-10-CM | POA: Diagnosis not present

## 2019-02-02 HISTORY — PX: VIDEO ASSISTED THORACOSCOPY (VATS)/THOROCOTOMY: SHX6173

## 2019-02-02 HISTORY — PX: WEDGE RESECTION: SHX5070

## 2019-02-02 LAB — GLUCOSE, CAPILLARY
Glucose-Capillary: 154 mg/dL — ABNORMAL HIGH (ref 70–99)
Glucose-Capillary: 119 mg/dL — ABNORMAL HIGH (ref 70–99)
Glucose-Capillary: 172 mg/dL — ABNORMAL HIGH (ref 70–99)
Glucose-Capillary: 143 mg/dL — ABNORMAL HIGH (ref 70–99)
Glucose-Capillary: 173 mg/dL — ABNORMAL HIGH (ref 70–99)

## 2019-02-02 LAB — SURGICAL PCR SCREEN
MRSA, PCR: NEGATIVE
Staphylococcus aureus: POSITIVE — AB

## 2019-02-02 IMAGING — DX DG CHEST 1V PORT
1 series · 1 of 1 positions shown · non-contrast
Comparison: Chest radiograph dated [DATE]

CLINICAL DATA: 78-year-old male status post thoracotomy. Recent
lung surgery for left lung nodule.

EXAM:
PORTABLE CHEST 1 VIEW

[chest ap]
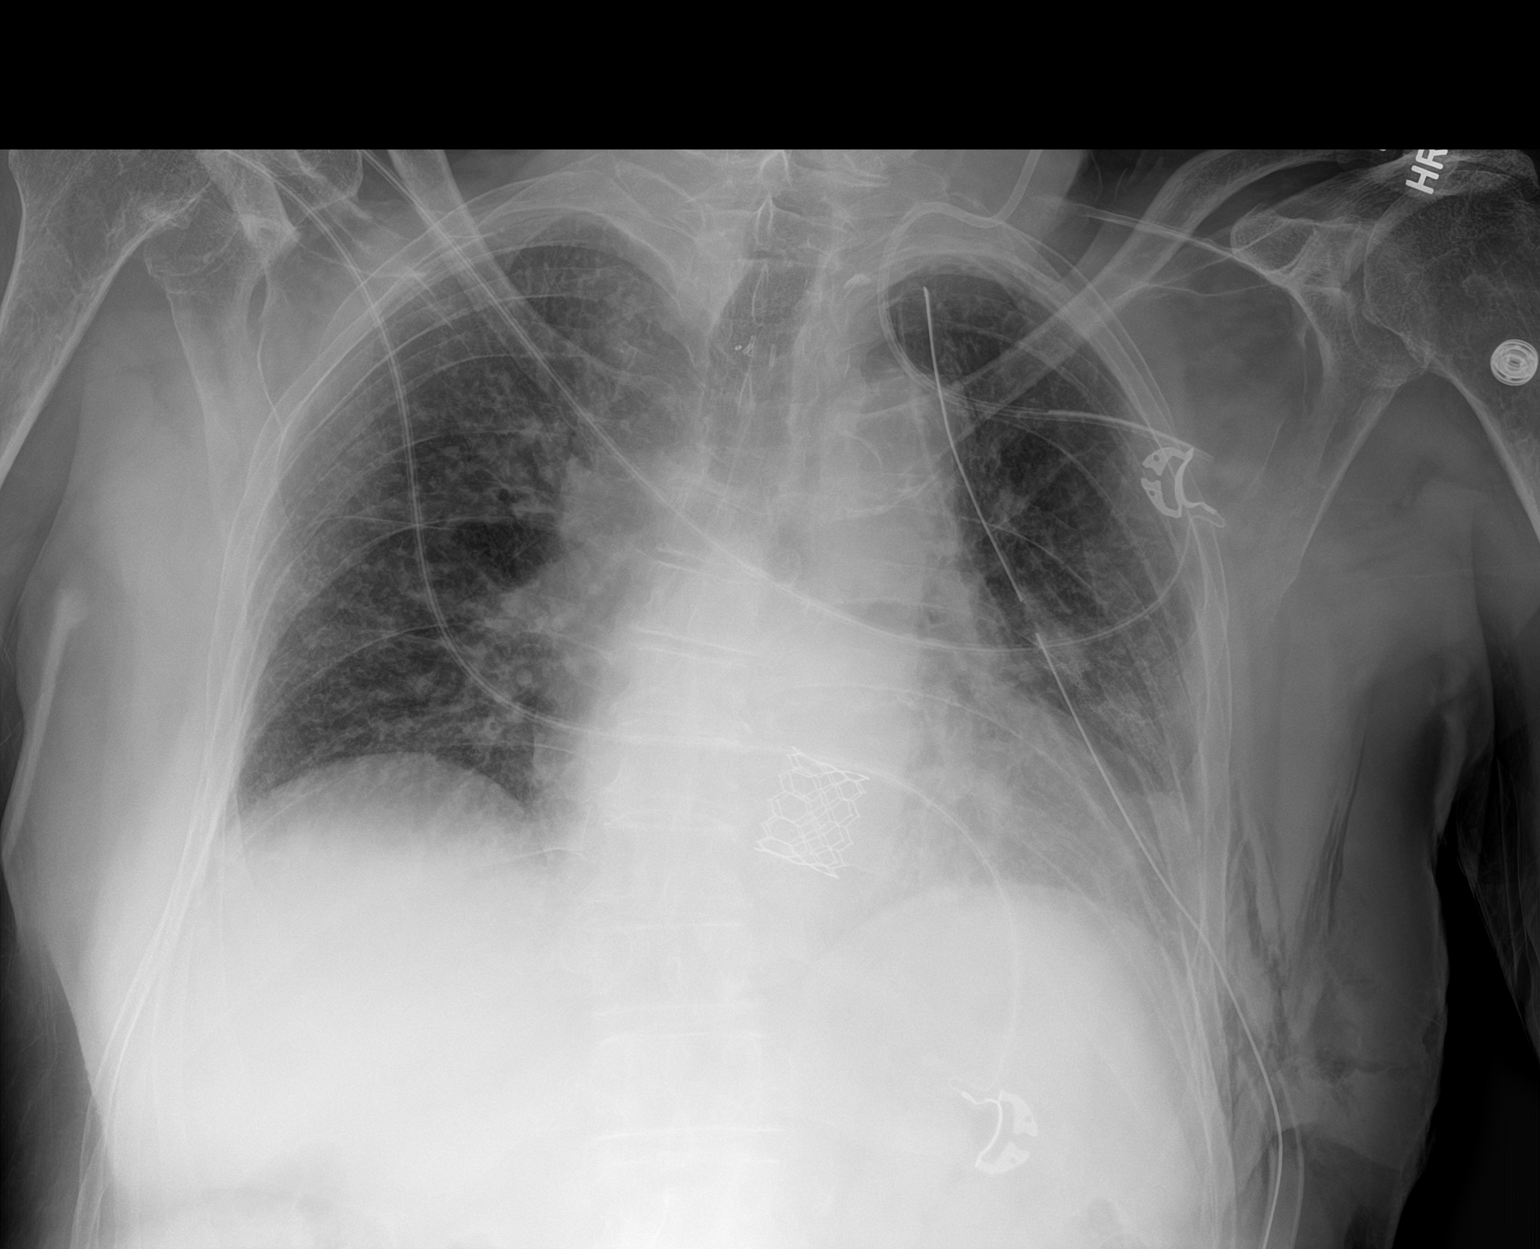

[1 of 1 positions shown; findings below may reference images not displayed]

FINDINGS: Left IJ central venous line extends laterally into the subclavian
and axillary veins. Recommend retraction and repositioning.
Left-sided chest tube with tip in the left apex. There is shallow
inspiration. Left lung base densities, likely atelectatic changes.
Pneumonia is not excluded. The right lung is clear. No large pleural
effusion. No pneumothorax identified. Suture material in the left
lung in keeping with recent surgery.

Stable cardiac silhouette. Aortic valve replacement. Atherosclerotic
calcification of the aortic arch. Several left lateral rib fractures
in keeping with thoracotomy. Air is noted soft tissues of the
lateral left chest wall.
IMPRESSION: 1. Left IJ central venous line extends into the subclavian and
axillary veins. Recommend retraction and repositioning.
2. Postsurgical changes of left lung nodule resection. Left lung
base densities, likely atelectatic changes. Pneumonia is not
excluded.
3. Left-sided chest tube with tip over the left apex. No
pneumothorax.

These results were called by telephone at the time of interpretation
on [DATE] at [DATE] to nurse [REDACTED], who verbally acknowledged
these results.

## 2019-02-02 SURGERY — VIDEO ASSISTED THORACOSCOPY (VATS)/THOROCOTOMY
Anesthesia: General | Site: Chest | Laterality: Left

## 2019-02-02 MED ORDER — CEFAZOLIN SODIUM-DEXTROSE 2-4 GM/100ML-% IV SOLN
INTRAVENOUS | Status: AC
  Filled 2019-02-02: qty 100

## 2019-02-02 MED ORDER — MIDAZOLAM HCL 2 MG/2ML IJ SOLN
INTRAMUSCULAR | Status: AC
  Filled 2019-02-02: qty 2

## 2019-02-02 MED ORDER — DORZOLAMIDE HCL 2 % OP SOLN
1.0000 [drp] | Freq: Three times a day (TID) | OPHTHALMIC | Status: DC
  Administered 2019-02-02 – 2019-02-06 (×11): 1 [drp] via OPHTHALMIC
  Filled 2019-02-02: qty 10

## 2019-02-02 MED ORDER — DEXTROSE IN LACTATED RINGERS 5 % IV SOLN: INTRAVENOUS | Status: DC

## 2019-02-02 MED ORDER — ACETAMINOPHEN 500 MG PO TABS
1000.0000 mg | ORAL_TABLET | Freq: Four times a day (QID) | ORAL | Status: DC
  Administered 2019-02-02 – 2019-02-06 (×13): 1000 mg via ORAL
  Filled 2019-02-02 (×15): qty 2

## 2019-02-02 MED ORDER — SUGAMMADEX SODIUM 200 MG/2ML IV SOLN
INTRAVENOUS | Status: DC | PRN
  Administered 2019-02-02: 200 mg via INTRAVENOUS

## 2019-02-02 MED ORDER — SODIUM CHLORIDE 0.9 % IV SOLN
INTRAVENOUS | Status: DC | PRN
  Administered 2019-02-02: 25 ug/min via INTRAVENOUS

## 2019-02-02 MED ORDER — METOPROLOL SUCCINATE ER 25 MG PO TB24
25.0000 mg | ORAL_TABLET | Freq: Every day | ORAL | Status: DC
  Administered 2019-02-03 – 2019-02-06 (×4): 25 mg via ORAL
  Filled 2019-02-02 (×5): qty 1

## 2019-02-02 MED ORDER — SIMVASTATIN 20 MG PO TABS
40.0000 mg | ORAL_TABLET | Freq: Every day | ORAL | Status: DC
  Administered 2019-02-02 – 2019-02-05 (×4): 40 mg via ORAL
  Filled 2019-02-02 (×4): qty 2

## 2019-02-02 MED ORDER — ROCURONIUM BROMIDE 10 MG/ML (PF) SYRINGE
PREFILLED_SYRINGE | INTRAVENOUS | Status: AC
  Filled 2019-02-02: qty 10

## 2019-02-02 MED ORDER — ASPIRIN 81 MG PO CHEW
81.0000 mg | CHEWABLE_TABLET | Freq: Every day | ORAL | Status: DC
  Administered 2019-02-02 – 2019-02-06 (×5): 81 mg via ORAL
  Filled 2019-02-02 (×5): qty 1

## 2019-02-02 MED ORDER — LACTATED RINGERS IV SOLN
INTRAVENOUS | Status: DC | PRN
  Administered 2019-02-02: 07:00:00 via INTRAVENOUS

## 2019-02-02 MED ORDER — ONDANSETRON HCL 4 MG/2ML IJ SOLN
INTRAMUSCULAR | Status: DC | PRN
  Administered 2019-02-02: 4 mg via INTRAVENOUS

## 2019-02-02 MED ORDER — LIDOCAINE 2% (20 MG/ML) 5 ML SYRINGE
INTRAMUSCULAR | Status: DC | PRN
  Administered 2019-02-02: 100 mg via INTRAVENOUS

## 2019-02-02 MED ORDER — ONDANSETRON HCL 4 MG/2ML IJ SOLN: 4.0000 mg | Freq: Four times a day (QID) | INTRAMUSCULAR | Status: DC | PRN

## 2019-02-02 MED ORDER — SODIUM CHLORIDE 0.9 % IV SOLN
INTRAVENOUS | Status: DC
  Administered 2019-02-02 – 2019-02-04 (×4): via INTRAVENOUS

## 2019-02-02 MED ORDER — METOCLOPRAMIDE HCL 5 MG/ML IJ SOLN
10.0000 mg | Freq: Four times a day (QID) | INTRAMUSCULAR | Status: AC
  Administered 2019-02-02 – 2019-02-03 (×4): 10 mg via INTRAVENOUS
  Filled 2019-02-02 (×4): qty 2

## 2019-02-02 MED ORDER — CEFAZOLIN SODIUM-DEXTROSE 2-4 GM/100ML-% IV SOLN
2.0000 g | INTRAVENOUS | Status: AC
  Administered 2019-02-02: 2 g via INTRAVENOUS

## 2019-02-02 MED ORDER — PROPOFOL 10 MG/ML IV BOLUS
INTRAVENOUS | Status: AC
  Filled 2019-02-02: qty 20

## 2019-02-02 MED ORDER — DIPHENHYDRAMINE HCL 50 MG/ML IJ SOLN: 12.5000 mg | Freq: Four times a day (QID) | INTRAMUSCULAR | Status: DC | PRN

## 2019-02-02 MED ORDER — LACTATED RINGERS IV SOLN
INTRAVENOUS | Status: DC | PRN
  Administered 2019-02-02: 08:00:00 via INTRAVENOUS

## 2019-02-02 MED ORDER — MIDAZOLAM HCL 2 MG/2ML IJ SOLN
INTRAMUSCULAR | Status: DC | PRN
  Administered 2019-02-02 (×2): 1 mg via INTRAVENOUS

## 2019-02-02 MED ORDER — HEMOSTATIC AGENTS (NO CHARGE) OPTIME
TOPICAL | Status: DC | PRN
  Administered 2019-02-02: 1 via TOPICAL

## 2019-02-02 MED ORDER — ACETAMINOPHEN 160 MG/5ML PO SOLN: 1000.0000 mg | Freq: Four times a day (QID) | ORAL | Status: DC

## 2019-02-02 MED ORDER — INSULIN ASPART 100 UNIT/ML ~~LOC~~ SOLN
0.0000 [IU] | SUBCUTANEOUS | Status: DC
  Administered 2019-02-02: 4 [IU] via SUBCUTANEOUS
  Administered 2019-02-02: 2 [IU] via SUBCUTANEOUS
  Administered 2019-02-02: 4 [IU] via SUBCUTANEOUS
  Administered 2019-02-03 – 2019-02-04 (×5): 2 [IU] via SUBCUTANEOUS
  Administered 2019-02-04: 8 [IU] via SUBCUTANEOUS
  Administered 2019-02-04 (×2): 2 [IU] via SUBCUTANEOUS
  Administered 2019-02-05: 09:00:00 8 [IU] via SUBCUTANEOUS

## 2019-02-02 MED ORDER — CHLORHEXIDINE GLUCONATE CLOTH 2 % EX PADS
6.0000 | MEDICATED_PAD | Freq: Every day | CUTANEOUS | Status: DC
  Administered 2019-02-02 – 2019-02-06 (×2): 6 via TOPICAL

## 2019-02-02 MED ORDER — DIPHENHYDRAMINE HCL 12.5 MG/5ML PO ELIX: 12.5000 mg | ORAL_SOLUTION | Freq: Four times a day (QID) | ORAL | Status: DC | PRN

## 2019-02-02 MED ORDER — PROPOFOL 10 MG/ML IV BOLUS
INTRAVENOUS | Status: DC | PRN
  Administered 2019-02-02: 120 mg via INTRAVENOUS

## 2019-02-02 MED ORDER — LUNG SURGERY BOOK
Freq: Once | Status: AC
  Administered 2019-02-02: 1
  Filled 2019-02-02: qty 1

## 2019-02-02 MED ORDER — BISACODYL 5 MG PO TBEC
10.0000 mg | DELAYED_RELEASE_TABLET | Freq: Every day | ORAL | Status: DC
  Administered 2019-02-02 – 2019-02-04 (×3): 10 mg via ORAL
  Filled 2019-02-02 (×3): qty 2

## 2019-02-02 MED ORDER — SODIUM CHLORIDE 0.9% FLUSH: 9.0000 mL | INTRAVENOUS | Status: DC | PRN

## 2019-02-02 MED ORDER — ROCURONIUM BROMIDE 10 MG/ML (PF) SYRINGE
PREFILLED_SYRINGE | INTRAVENOUS | Status: DC | PRN
  Administered 2019-02-02: 80 mg via INTRAVENOUS

## 2019-02-02 MED ORDER — CEFAZOLIN SODIUM-DEXTROSE 2-4 GM/100ML-% IV SOLN
2.0000 g | Freq: Three times a day (TID) | INTRAVENOUS | Status: AC
  Administered 2019-02-02 – 2019-02-03 (×2): 2 g via INTRAVENOUS
  Filled 2019-02-02 (×3): qty 100

## 2019-02-02 MED ORDER — FENTANYL CITRATE (PF) 250 MCG/5ML IJ SOLN
INTRAMUSCULAR | Status: AC
  Filled 2019-02-02: qty 5

## 2019-02-02 MED ORDER — FENTANYL CITRATE (PF) 100 MCG/2ML IJ SOLN
INTRAMUSCULAR | Status: DC | PRN
  Administered 2019-02-02: 50 ug via INTRAVENOUS
  Administered 2019-02-02: 100 ug via INTRAVENOUS
  Administered 2019-02-02 (×2): 50 ug via INTRAVENOUS

## 2019-02-02 MED ORDER — MUPIROCIN 2 % EX OINT
1.0000 "application " | TOPICAL_OINTMENT | Freq: Two times a day (BID) | CUTANEOUS | Status: DC
  Administered 2019-02-02 – 2019-02-06 (×8): 1 via NASAL
  Filled 2019-02-02: qty 22

## 2019-02-02 MED ORDER — SENNOSIDES-DOCUSATE SODIUM 8.6-50 MG PO TABS
1.0000 | ORAL_TABLET | Freq: Every day | ORAL | Status: DC
  Administered 2019-02-02 – 2019-02-05 (×4): 1 via ORAL
  Filled 2019-02-02 (×4): qty 1

## 2019-02-02 MED ORDER — NALOXONE HCL 0.4 MG/ML IJ SOLN: 0.4000 mg | INTRAMUSCULAR | Status: DC | PRN

## 2019-02-02 MED ORDER — IPRATROPIUM-ALBUTEROL 0.5-2.5 (3) MG/3ML IN SOLN
3.0000 mL | Freq: Four times a day (QID) | RESPIRATORY_TRACT | Status: DC
  Administered 2019-02-02 – 2019-02-04 (×10): 3 mL via RESPIRATORY_TRACT
  Filled 2019-02-02 (×10): qty 3

## 2019-02-02 MED ORDER — ONDANSETRON HCL 4 MG/2ML IJ SOLN
INTRAMUSCULAR | Status: AC
  Filled 2019-02-02: qty 2

## 2019-02-02 MED ORDER — MORPHINE SULFATE 2 MG/ML IV SOLN
INTRAVENOUS | Status: DC
  Administered 2019-02-02: 0 mg via INTRAVENOUS
  Administered 2019-02-02: 18 mg via INTRAVENOUS
  Administered 2019-02-02: 11:00:00 via INTRAVENOUS
  Administered 2019-02-03: 1.5 mg via INTRAVENOUS
  Administered 2019-02-03: 0 mg via INTRAVENOUS
  Administered 2019-02-03: 3 mg via INTRAVENOUS
  Administered 2019-02-03: 1.5 mg via INTRAVENOUS
  Administered 2019-02-03: 4.5 mg via INTRAVENOUS
  Administered 2019-02-04 (×2): 0 mg via INTRAVENOUS
  Filled 2019-02-02 (×2): qty 30

## 2019-02-02 MED ORDER — LIDOCAINE 2% (20 MG/ML) 5 ML SYRINGE
INTRAMUSCULAR | Status: AC
  Filled 2019-02-02: qty 5

## 2019-02-02 MED ORDER — OXYCODONE HCL 5 MG PO TABS: 5.0000 mg | ORAL_TABLET | ORAL | Status: DC | PRN

## 2019-02-02 MED ORDER — PHENYLEPHRINE 40 MCG/ML (10ML) SYRINGE FOR IV PUSH (FOR BLOOD PRESSURE SUPPORT)
PREFILLED_SYRINGE | INTRAVENOUS | Status: DC | PRN
  Administered 2019-02-02: 200 ug via INTRAVENOUS
  Administered 2019-02-02: 160 ug via INTRAVENOUS

## 2019-02-02 MED ORDER — CHLORHEXIDINE GLUCONATE CLOTH 2 % EX PADS
6.0000 | MEDICATED_PAD | Freq: Every day | CUTANEOUS | Status: DC
  Administered 2019-02-03 – 2019-02-06 (×4): 6 via TOPICAL

## 2019-02-02 MED ORDER — 0.9 % SODIUM CHLORIDE (POUR BTL) OPTIME
TOPICAL | Status: DC | PRN
  Administered 2019-02-02: 07:00:00 2000 mL

## 2019-02-02 SURGICAL SUPPLY — 94 items
ADH SKN CLS APL DERMABOND .7 (GAUZE/BANDAGES/DRESSINGS) ×1
APL SRG 22X2 LUM MLBL SLNT (VASCULAR PRODUCTS)
APPLICATOR TIP EXT COSEAL (VASCULAR PRODUCTS) IMPLANT
BIT DRILL 7/64X5 DISP (BIT) ×2 IMPLANT
BLADE CLIPPER SURG (BLADE) ×1 IMPLANT
CANISTER SUCT 3000ML PPV (MISCELLANEOUS) ×4 IMPLANT
CATH KIT ON-Q SILVERSOAK 5 (CATHETERS) IMPLANT
CATH KIT ON-Q SILVERSOAK 5IN (CATHETERS) IMPLANT
CATH THORACIC 28FR (CATHETERS) ×2 IMPLANT
CATH THORACIC 36FR (CATHETERS) IMPLANT
CATH THORACIC 36FR RT ANG (CATHETERS) IMPLANT
CLIP VESOCCLUDE MED 24/CT (CLIP) ×2 IMPLANT
CLIP VESOCCLUDE MED 6/CT (CLIP) ×3 IMPLANT
CONN ST 1/4X3/8  BEN (MISCELLANEOUS)
CONN ST 1/4X3/8 BEN (MISCELLANEOUS) IMPLANT
CONN Y 3/8X3/8X3/8  BEN (MISCELLANEOUS)
CONN Y 3/8X3/8X3/8 BEN (MISCELLANEOUS) IMPLANT
CONT SPEC 4OZ CLIKSEAL STRL BL (MISCELLANEOUS) ×6 IMPLANT
COVER SURGICAL LIGHT HANDLE (MISCELLANEOUS) ×2 IMPLANT
COVER WAND RF STERILE (DRAPES) ×1 IMPLANT
DERMABOND ADVANCED (GAUZE/BANDAGES/DRESSINGS) ×2
DERMABOND ADVANCED .7 DNX12 (GAUZE/BANDAGES/DRESSINGS) ×1 IMPLANT
DRAIN CHANNEL 28F RND 3/8 FF (WOUND CARE) IMPLANT
DRAIN CHANNEL 32F RND 10.7 FF (WOUND CARE) IMPLANT
DRAPE INCISE IOBAN 66X45 STRL (DRAPES) ×2 IMPLANT
DRAPE LAPAROSCOPIC ABDOMINAL (DRAPES) ×3 IMPLANT
DRAPE WARM FLUID 44X44 (DRAPES) ×6 IMPLANT
ELECT BLADE 4.0 EZ CLEAN MEGAD (MISCELLANEOUS) ×3
ELECT BLADE 6.5 EXT (BLADE) ×2 IMPLANT
ELECT REM PT RETURN 9FT ADLT (ELECTROSURGICAL) ×3
ELECTRODE BLDE 4.0 EZ CLN MEGD (MISCELLANEOUS) IMPLANT
ELECTRODE REM PT RTRN 9FT ADLT (ELECTROSURGICAL) ×1 IMPLANT
GAUZE SPONGE 4X4 12PLY STRL (GAUZE/BANDAGES/DRESSINGS) ×3 IMPLANT
GAUZE SPONGE 4X4 12PLY STRL LF (GAUZE/BANDAGES/DRESSINGS) ×2 IMPLANT
GLOVE EUDERMIC 7 POWDERFREE (GLOVE) ×2 IMPLANT
GOWN STRL REUS W/ TWL LRG LVL3 (GOWN DISPOSABLE) ×2 IMPLANT
GOWN STRL REUS W/ TWL XL LVL3 (GOWN DISPOSABLE) ×1 IMPLANT
GOWN STRL REUS W/TWL LRG LVL3 (GOWN DISPOSABLE) ×6
GOWN STRL REUS W/TWL XL LVL3 (GOWN DISPOSABLE) ×3
HANDLE STAPLE ENDO GIA SHORT (STAPLE) ×2
HEMOSTAT SURGICEL 2X14 (HEMOSTASIS) IMPLANT
KIT BASIN OR (CUSTOM PROCEDURE TRAY) ×3 IMPLANT
KIT TURNOVER KIT B (KITS) ×3 IMPLANT
NS IRRIG 1000ML POUR BTL (IV SOLUTION) ×8 IMPLANT
PACK CHEST (CUSTOM PROCEDURE TRAY) ×3 IMPLANT
PAD ARMBOARD 7.5X6 YLW CONV (MISCELLANEOUS) ×6 IMPLANT
PASSER SUT SWANSON 36MM LOOP (INSTRUMENTS) ×2 IMPLANT
RELOAD EGIA 45 MED/THCK PURPLE (STAPLE) ×2 IMPLANT
RELOAD EGIA BLACK ROTIC 45MM (STAPLE) ×3 IMPLANT
RELOAD STAPLE 45 BLK XTHK (STAPLE) IMPLANT
RELOAD STAPLE 60 BLK VRY/THCK (STAPLE) IMPLANT
RELOAD STAPLE 60 BLK XTHK ART (STAPLE) IMPLANT
RELOAD STAPLER 60MM BLK (STAPLE) IMPLANT
RELOAD TRI 2.0 60 XTHK VAS SUL (STAPLE) ×6 IMPLANT
SEALANT SURG COSEAL 4ML (VASCULAR PRODUCTS) IMPLANT
SEALANT SURG COSEAL 8ML (VASCULAR PRODUCTS) IMPLANT
SOL ANTI FOG 6CC (MISCELLANEOUS) ×1 IMPLANT
SOLUTION ANTI FOG 6CC (MISCELLANEOUS) ×2
STAPLER ENDO GIA 12 SHRT THIN (STAPLE) IMPLANT
STAPLER ENDO GIA 12MM SHORT (STAPLE) ×1 IMPLANT
STAPLER RELOAD 60MM BLK (STAPLE)
SUT PROLENE 3 0 SH DA (SUTURE) IMPLANT
SUT PROLENE 4 0 RB 1 (SUTURE)
SUT PROLENE 4-0 RB1 .5 CRCL 36 (SUTURE) IMPLANT
SUT SILK  1 MH (SUTURE) ×4
SUT SILK 1 MH (SUTURE) ×2 IMPLANT
SUT SILK 1 TIES 10X30 (SUTURE) IMPLANT
SUT SILK 2 0 SH (SUTURE) ×6 IMPLANT
SUT SILK 2 0SH CR/8 30 (SUTURE) IMPLANT
SUT SILK 3 0SH CR/8 30 (SUTURE) IMPLANT
SUT VIC AB 1 CTX 18 (SUTURE) ×3 IMPLANT
SUT VIC AB 1 CTX 36 (SUTURE) ×3
SUT VIC AB 1 CTX36XBRD ANBCTR (SUTURE) ×1 IMPLANT
SUT VIC AB 2-0 CT1 27 (SUTURE)
SUT VIC AB 2-0 CT1 TAPERPNT 27 (SUTURE) IMPLANT
SUT VIC AB 2-0 CTX 36 (SUTURE) ×4 IMPLANT
SUT VIC AB 2-0 UR6 27 (SUTURE) IMPLANT
SUT VIC AB 3-0 MH 27 (SUTURE) IMPLANT
SUT VIC AB 3-0 SH 27 (SUTURE)
SUT VIC AB 3-0 SH 27X BRD (SUTURE) IMPLANT
SUT VIC AB 3-0 X1 27 (SUTURE) ×6 IMPLANT
SUT VICRYL 2 TP 1 (SUTURE) ×3 IMPLANT
SWAB COLLECTION DEVICE MRSA (MISCELLANEOUS) ×1 IMPLANT
SWAB CULTURE ESWAB REG 1ML (MISCELLANEOUS) ×3 IMPLANT
SYSTEM SAHARA CHEST DRAIN ATS (WOUND CARE) ×3 IMPLANT
TAPE CLOTH 4X10 WHT NS (GAUZE/BANDAGES/DRESSINGS) ×3 IMPLANT
TAPE CLOTH SURG 4X10 WHT LF (GAUZE/BANDAGES/DRESSINGS) ×2 IMPLANT
TIP APPLICATOR SPRAY EXTEND 16 (VASCULAR PRODUCTS) IMPLANT
TOWEL GREEN STERILE (TOWEL DISPOSABLE) ×3 IMPLANT
TOWEL GREEN STERILE FF (TOWEL DISPOSABLE) ×3 IMPLANT
TRAP SPECIMEN MUCOUS 40CC (MISCELLANEOUS) IMPLANT
TRAY FOLEY SLVR 16FR LF STAT (SET/KITS/TRAYS/PACK) ×2 IMPLANT
TUNNELER SHEATH ON-Q 11GX8 DSP (PAIN MANAGEMENT) IMPLANT
WATER STERILE IRR 1000ML POUR (IV SOLUTION) ×6 IMPLANT

## 2019-02-02 NOTE — Brief Op Note (Signed)
02/02/2019  9:46 AM  PATIENT:  Vincent Velez  78 y.o. male  PRE-OPERATIVE DIAGNOSIS:  LEFT LOWER LOBE LUNG MASS  POST-OPERATIVE DIAGNOSIS:  LEFT LOWER LOBE LUNG MASS  PROCEDURE:  Procedure(s): VIDEO ASSISTED THORACOSCOPY (VATS)/THOROCOTOMY (Left) LUNG RESECTION (Left)  SURGEON:  Surgeon(s) and Role:    * Yael Coppess, Fernande Boyden, MD - Primary  PHYSICIAN ASSISTANT: Nicholes Rough, PA-C   ANESTHESIA:   general  EBL:  minimal    BLOOD ADMINISTERED:none  DRAINS: one 52F Chest Tube(s) in the left pleural space   LOCAL MEDICATIONS USED:  NONE  SPECIMEN:  Source of Specimen:  wedge resection Left upper lobe lung.  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  COUNTS:  YES  TOURNIQUET:  * No tourniquets in log *  DICTATION: .Note written in [place]: Admit to inpatient   PATIENT DISPOSITION:  PACU - hemodynamically stable.   Delay start of Pharmacological VTE agent (>24hrs) due to surgical blood loss or risk of bleeding: yes

## 2019-02-02 NOTE — Op Note (Signed)
CARDIOTHORACIC SURGERY OPERATIVE NOTE  02/02/2019 Vincent Velez [id number]  Surgeon:  Gaye Pollack, MD  First Assistant: Nicholes Rough, PA-C   Preoperative Diagnosis:  Left upper lobe lung mass   Postoperative Diagnosis: Adenocarcinoma LUL  Procedure:  1. Left video-assisted thoracoscopy 2. Left muscle-sparing thoracotomy 3. Wedge resection of left upper lobe lung nodule  Anesthesia:  General Endotracheal   Clinical History/Surgical Indication:  The patient has a 2.6 x 2.2 cm mass in the left upper lobe with a maximum SUV of 4.7 that is highly suspicious for bronchogenic carcinoma.  He has some mediastinal adenopathy which was biopsied on mediastinoscopy and all the biopsies were negative for metastatic carcinoma.  His pulmonary function testing is adequate to allow wedge resection or left upper lobectomy if needed for surgical resection of this lesion.  I discussed the procedure of left video-assisted thoracoscopy or thoracotomy for surgical resection with the patient.  I discussed this the benefits and risk of surgery including but not limited to bleeding, blood transfusion, infection, prolonged air leak from the lung, postoperative respiratory insufficiency, and recurrent cancer.  He understands all this and agrees to proceed.  Preparation:  The patient was seen in the preoperative holding area and the correct patient, correct operation, correct operative sidewere confirmed with the patient after reviewing the medical record and CT scan. The consent was signed by me. Preoperative antibiotics were given.  The patient was taken back to the operating room and positioned supine on the operating room table. After being placed under general endotracheal anesthesia by the anesthesia team using a double lumen tube a foley catheter was placed. The patient was turned into the right lateral decubitus position. The chest was prepped with betadine soap and solution.  A surgical time-out was  taken and the correct patient,operative side, and operative procedure were confirmed with the nursing and anesthesia staff.   Operative Procedure:  A 1 cm incision was made in the mid-axillary line at the 8th intercostal space. The right lung was deflated. A 10 mm trocar was inserted into the pleural space and a 30 degree thoracoscope was inserted. The left lung was hyperinflated with air-trapping and the lung did not deflate much. The lung was compressed manually to allow visualization. The pleural surfaces were normal.  A short lateral muscle-sparing thoracotomy was performed. The pleural space was entered through the 5th intercostal space. The left upper lobe was palpated and the nodule found. I did not feel any other nodules except the one noted on CT scan in the lingula.  This lesion was removed by wedge resection using surgical staplers. It was sent to pathology for frozen section of the lesion and the margin. The pathologist called the OR and said it was adenocarcinoma with negative surgical margins. The staple line was coated with Coseal.  There was no residual air leak. A 28 F chest tube was placed through the trocar incision and advanced posteriorly to the apex. The ribs were re-approximated using #2 vicryl peri-costal sutures that were placed through the middle of the 6th rib after drilling small holes and then around the 5th rib. The muscles were returned to their normal position. The subcutaneous tissue was closed with 2-0 vicryl continuous suture. The skin was closed with 3-0 vicryl subcuticular suture. All sponge, needle, and instrument counts were reported correct at the end of the case. Dry sterile dressings were placed over the incisions and around the chest tube which was connected to pleurevac suction. The patient was turned supine,  extubated,then transported to the PACU in satisfactory and stable condition.

## 2019-02-02 NOTE — Transfer of Care (Signed)
Immediate Anesthesia Transfer of Care Note  Patient: Vincent Velez  Procedure(s) Performed: VIDEO ASSISTED THORACOSCOPY (VATS)/THOROCOTOMY (Left Chest) LUNG RESECTION (Left Chest)  Patient Location: PACU  Anesthesia Type:General  Level of Consciousness: drowsy and patient cooperative  Airway & Oxygen Therapy: Patient Spontanous Breathing and Patient connected to nasal cannula oxygen  Post-op Assessment: Report given to RN  Post vital signs: Reviewed and stable  Last Vitals:  Vitals Value Taken Time  BP 109/89 02/02/19 1004  Temp    Pulse 91 02/02/19 1004  Resp 26 02/02/19 1004  SpO2 97 % 02/02/19 1004  Vitals shown include unvalidated device data.  Last Pain:  Vitals:   02/02/19 0617  TempSrc:   PainSc: 0-No pain      Patients Stated Pain Goal: 4 (27/87/18 3672)  Complications: No apparent anesthesia complications

## 2019-02-02 NOTE — Anesthesia Procedure Notes (Signed)
Central Venous Catheter Insertion Performed by: Roberts Gaudy, MD, anesthesiologist Start/End9/07/2018 6:40 AM, 02/02/2019 6:50 AM Patient location: Pre-op. Preanesthetic checklist: patient identified, IV checked, site marked, risks and benefits discussed, surgical consent, monitors and equipment checked, pre-op evaluation, timeout performed and anesthesia consent Lidocaine 1% used for infiltration and patient sedated Hand hygiene performed  and maximum sterile barriers used  Catheter size: 8 Fr Total catheter length 16. Central line was placed.Double lumen Procedure performed using ultrasound guided technique. Ultrasound Notes:image(s) printed for medical record Attempts: 1 Following insertion, dressing applied and line sutured. Post procedure assessment: blood return through all ports  Patient tolerated the procedure well with no immediate complications.

## 2019-02-02 NOTE — Anesthesia Procedure Notes (Signed)
Procedure Name: Intubation Date/Time: 02/02/2019 7:59 AM Performed by: Barrington Ellison, CRNA Pre-anesthesia Checklist: Patient identified, Emergency Drugs available, Suction available and Patient being monitored Patient Re-evaluated:Patient Re-evaluated prior to induction Oxygen Delivery Method: Circle System Utilized Preoxygenation: Pre-oxygenation with 100% oxygen Induction Type: IV induction Ventilation: Mask ventilation without difficulty Laryngoscope Size: Mac and 4 Grade View: Grade I Tube type: Oral Endobronchial tube: Double lumen EBT, EBT position confirmed by fiberoptic bronchoscope and Left and 41 Fr Number of attempts: 1 Airway Equipment and Method: Stylet and Oral airway Placement Confirmation: ETT inserted through vocal cords under direct vision,  positive ETCO2 and breath sounds checked- equal and bilateral Secured at: 28 cm Tube secured with: Tape Dental Injury: Teeth and Oropharynx as per pre-operative assessment

## 2019-02-02 NOTE — Anesthesia Preprocedure Evaluation (Signed)
Anesthesia Evaluation  Patient identified by MRN, date of birth, ID band Patient awake    Reviewed: Allergy & Precautions, NPO status , Patient's Chart, lab work & pertinent test results  Airway Mallampati: I  TM Distance: >3 FB Neck ROM: Full    Dental   Pulmonary COPD, Current Smoker and Patient abstained from smoking.,    Pulmonary exam normal        Cardiovascular hypertension, Pt. on medications + CAD  Normal cardiovascular exam+ dysrhythmias Atrial Fibrillation      Neuro/Psych Anxiety    GI/Hepatic GERD  Medicated and Controlled,  Endo/Other  diabetes, Type 2  Renal/GU      Musculoskeletal   Abdominal   Peds  Hematology   Anesthesia Other Findings   Reproductive/Obstetrics                             Anesthesia Physical Anesthesia Plan  ASA: III  Anesthesia Plan: General   Post-op Pain Management:    Induction: Intravenous  PONV Risk Score and Plan: 1 and Ondansetron, Midazolam and Treatment may vary due to age or medical condition  Airway Management Planned: Double Lumen EBT  Additional Equipment: Arterial line, CVP and Ultrasound Guidance Line Placement  Intra-op Plan:   Post-operative Plan: Extubation in OR  Informed Consent: I have reviewed the patients History and Physical, chart, labs and discussed the procedure including the risks, benefits and alternatives for the proposed anesthesia with the patient or authorized representative who has indicated his/her understanding and acceptance.       Plan Discussed with: CRNA and Surgeon  Anesthesia Plan Comments:         Anesthesia Quick Evaluation

## 2019-02-02 NOTE — Anesthesia Procedure Notes (Signed)
Arterial Line Insertion Start/End9/07/2018 6:58 AM, 02/02/2019 7:03 AM Performed by: Josephine Igo, CRNA, CRNA  Preanesthetic checklist: patient identified, IV checked, risks and benefits discussed and pre-op evaluation Lidocaine 1% used for infiltration and patient sedated Left, radial was placed Catheter size: 20 G Hand hygiene performed  and maximum sterile barriers used  Allen's test indicative of satisfactory collateral circulation Attempts: 1 Procedure performed without using ultrasound guided technique. Following insertion, dressing applied and Biopatch. Post procedure assessment: normal  Patient tolerated the procedure well with no immediate complications.

## 2019-02-02 NOTE — Anesthesia Postprocedure Evaluation (Signed)
Anesthesia Post Note  Patient: Vincent Velez  Procedure(s) Performed: VIDEO ASSISTED THORACOSCOPY (VATS)/THOROCOTOMY (Left Chest) LUNG RESECTION (Left Chest)     Patient location during evaluation: PACU Anesthesia Type: General Level of consciousness: awake and alert Pain management: pain level controlled Vital Signs Assessment: post-procedure vital signs reviewed and stable Respiratory status: spontaneous breathing, nonlabored ventilation, respiratory function stable and patient connected to nasal cannula oxygen Cardiovascular status: blood pressure returned to baseline and stable Postop Assessment: no apparent nausea or vomiting Anesthetic complications: no    Last Vitals:  Vitals:   02/02/19 1430 02/02/19 1445  BP:    Pulse: 78 82  Resp: 10 10  Temp:    SpO2: 99% 99%    Last Pain:  Vitals:   02/02/19 1245  TempSrc: Oral  PainSc: 6                  Tiwanna Tuch DAVID

## 2019-02-02 NOTE — Progress Notes (Signed)
      [address]       [place] [address]             [phone]      S/p LUL wedge resection  Asleep at present BP (!) 87/65 (BP Location: Left Arm)   Pulse 88   Temp 98.5 F (36.9 C) (Oral)   Resp (!) 8   Ht 5\' 9"  (1.753 m)   Wt 81.2 kg   SpO2 97%   BMI 26.43 kg/m   Intake/Output Summary (Last 24 hours) at 02/02/2019 1704 Last data filed at 02/02/2019 1608 Gross per 24 hour  Intake 514.05 ml  Output 450 ml  Net 64.05 ml   Small air leak  Doing well s/p wedge resection  Remo Lipps C. Roxan Hockey, MD [facility] [phone]

## 2019-02-02 NOTE — Interval H&P Note (Signed)
History and Physical Interval Note:  02/02/2019 7:29 AM  Vincent Velez  has presented today for surgery, with the diagnosis of LEFT LOWER LOBE LUNG MASS.  The various methods of treatment have been discussed with the patient and family. After consideration of risks, benefits and other options for treatment, the patient has consented to  Procedure(s): VIDEO ASSISTED THORACOSCOPY (VATS)/THOROCOTOMY (Left) LUNG RESECTION (Left) as a surgical intervention.  The patient's history has been reviewed, patient examined, no change in status, stable for surgery.  I have reviewed the patient's chart and labs.  Questions were answered to the patient's satisfaction.     Gaye Pollack

## 2019-02-03 ENCOUNTER — Inpatient Hospital Stay (HOSPITAL_COMMUNITY): Payer: Medicare Other

## 2019-02-03 ENCOUNTER — Encounter (HOSPITAL_COMMUNITY): Payer: Self-pay | Admitting: Surgery

## 2019-02-03 LAB — BASIC METABOLIC PANEL
Anion gap: 9 (ref 5–15)
BUN: 9 mg/dL (ref 8–23)
CO2: 22 mmol/L (ref 22–32)
Calcium: 8.7 mg/dL — ABNORMAL LOW (ref 8.9–10.3)
Chloride: 106 mmol/L (ref 98–111)
Creatinine, Ser: 0.79 mg/dL (ref 0.61–1.24)
GFR calc Af Amer: >60 mL/min (ref >60)
GFR calc non Af Amer: >60 mL/min (ref >60)
Glucose, Bld: 97 mg/dL (ref 70–99)
Potassium: 3.4 mmol/L — ABNORMAL LOW (ref 3.5–5.1)
Sodium: 137 mmol/L (ref 135–145)

## 2019-02-03 LAB — POCT I-STAT 7, (LYTES, BLD GAS, ICA,H+H)
Acid-base deficit: 2 mmol/L (ref 0.0–2.0)
Bicarbonate: 23.2 mmol/L (ref 20.0–28.0)
Calcium, Ion: 1.26 mmol/L (ref 1.15–1.40)
HCT: 35 % — ABNORMAL LOW (ref 39.0–52.0)
Hemoglobin: 11.9 g/dL — ABNORMAL LOW (ref 13.0–17.0)
O2 Saturation: 97 %
Patient temperature: 98.6
Potassium: 3.4 mmol/L — ABNORMAL LOW (ref 3.5–5.1)
Sodium: 140 mmol/L (ref 135–145)
TCO2: 24 mmol/L (ref 22–32)
pCO2 arterial: 39.4 mmHg (ref 32.0–48.0)
pH, Arterial: 7.379 (ref 7.350–7.450)
pO2, Arterial: 92 mmHg (ref 83.0–108.0)

## 2019-02-03 LAB — GLUCOSE, CAPILLARY
Glucose-Capillary: 120 mg/dL — ABNORMAL HIGH (ref 70–99)
Glucose-Capillary: 122 mg/dL — ABNORMAL HIGH (ref 70–99)
Glucose-Capillary: 123 mg/dL — ABNORMAL HIGH (ref 70–99)
Glucose-Capillary: 154 mg/dL — ABNORMAL HIGH (ref 70–99)
Glucose-Capillary: 72 mg/dL (ref 70–99)
Glucose-Capillary: 80 mg/dL (ref 70–99)

## 2019-02-03 LAB — CBC
HCT: 35 % — ABNORMAL LOW (ref 39.0–52.0)
Hemoglobin: 11.4 g/dL — ABNORMAL LOW (ref 13.0–17.0)
MCH: 30.7 pg (ref 26.0–34.0)
MCHC: 32.6 g/dL (ref 30.0–36.0)
MCV: 94.3 fL (ref 80.0–100.0)
Platelets: 173 10*3/uL (ref 150–400)
RBC: 3.71 MIL/uL — ABNORMAL LOW (ref 4.22–5.81)
RDW: 14 % (ref 11.5–15.5)
WBC: 9.6 10*3/uL (ref 4.0–10.5)
nRBC: 0 % (ref 0.0–0.2)

## 2019-02-03 LAB — MAGNESIUM: Magnesium: 1.9 mg/dL (ref 1.7–2.4)

## 2019-02-03 IMAGING — DX DG CHEST 1V PORT
1 series · 1 of 1 positions shown · non-contrast
Comparison: [DATE]

CLINICAL DATA: Status post thoracotomy and left lung wedge
resection on [DATE].

EXAM:
PORTABLE CHEST 1 VIEW

[chest ap]
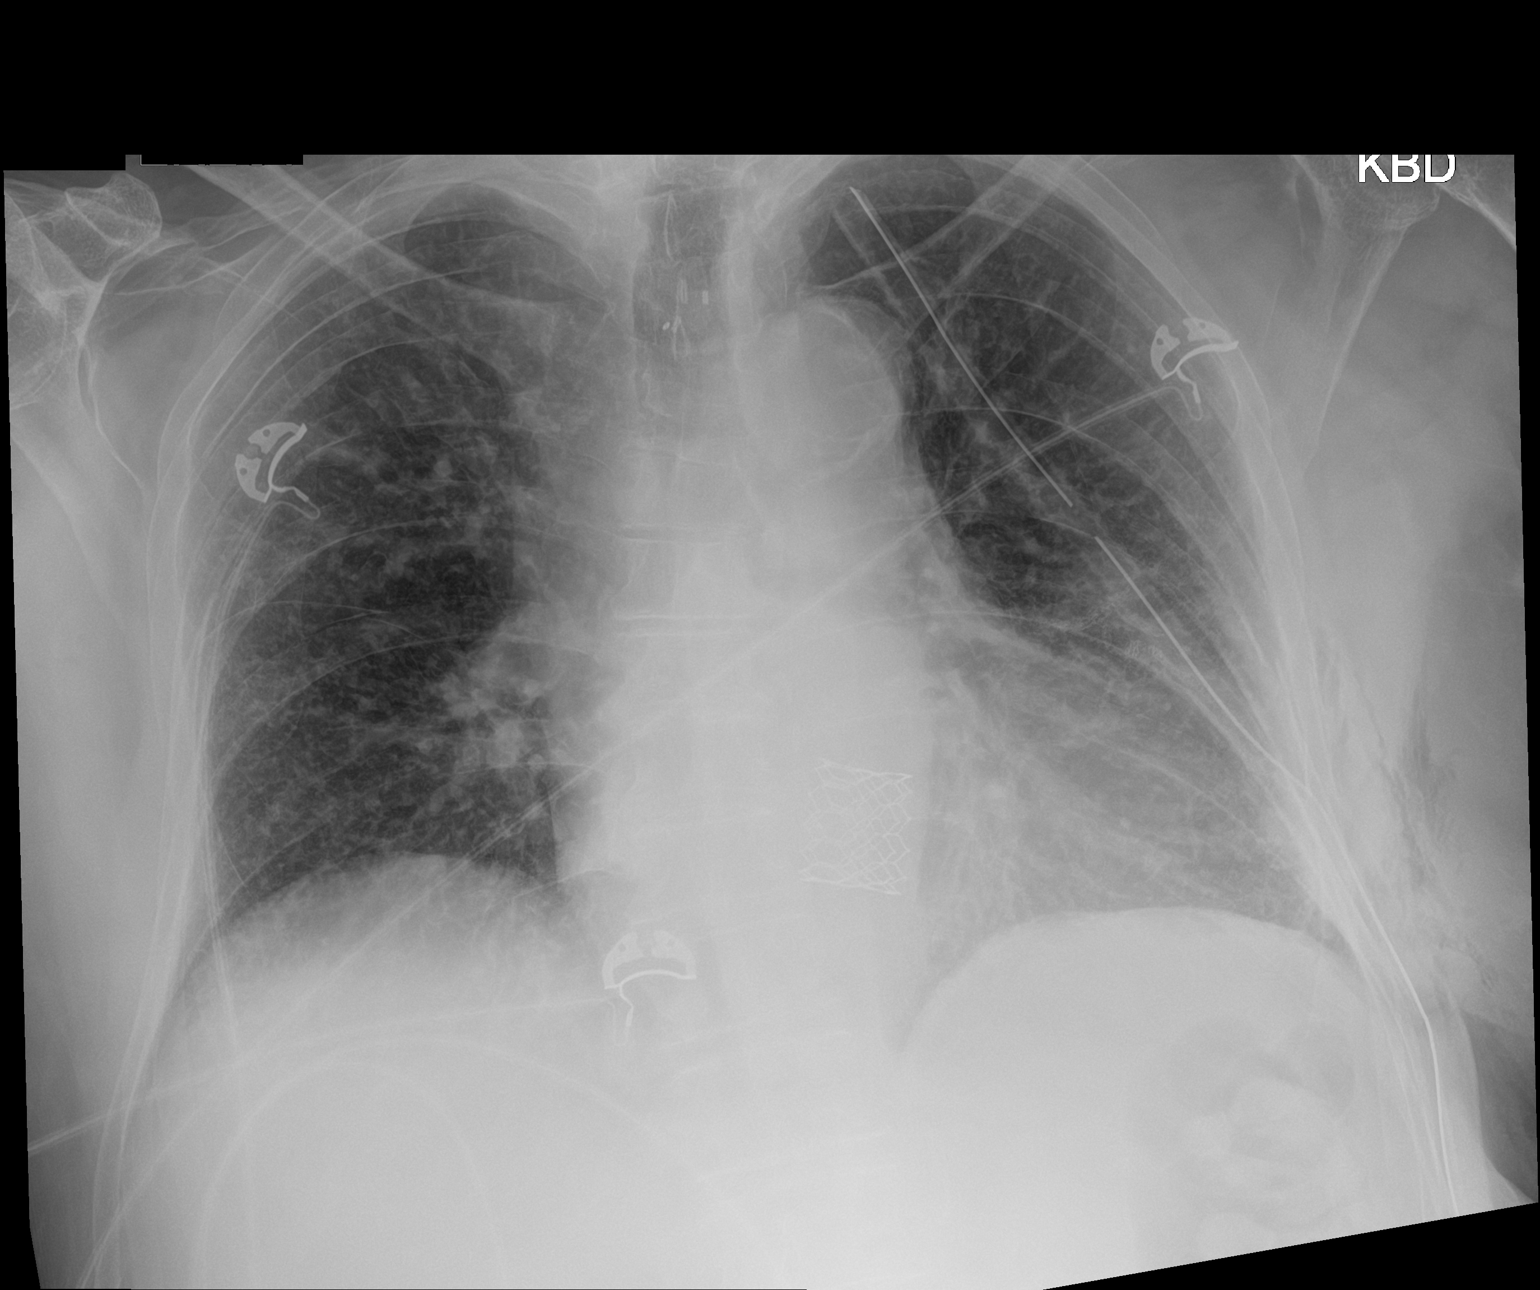

[1 of 1 positions shown; findings below may reference images not displayed]

FINDINGS: Postsurgical changes are again noted with a left chest tube
remaining in place and terminating near the lung apex. The left
jugular catheter has been removed. The cardiomediastinal silhouette
is unchanged with prior aortic valve replacement again noted. The
lungs remain hypoinflated, however lung volumes are slightly
improved from the prior study with improved aeration of the left
lung base. There is mild central pulmonary vascular congestion
without overt edema. No confluent airspace opacity, sizeable pleural
effusion, or pneumothorax is identified. Aortic atherosclerosis is
noted.
IMPRESSION: 1. Improved aeration of the left lung base.
2. Left chest tube in place without evidence of a pneumothorax.
3. Interval removal of left jugular catheter.

## 2019-02-03 MED ORDER — METOPROLOL TARTRATE 5 MG/5ML IV SOLN
INTRAVENOUS | Status: AC
  Administered 2019-02-03: 5 mg
  Filled 2019-02-03: qty 5

## 2019-02-03 MED ORDER — GUAIFENESIN ER 600 MG PO TB12
1200.0000 mg | ORAL_TABLET | Freq: Two times a day (BID) | ORAL | Status: DC
  Administered 2019-02-03 – 2019-02-06 (×7): 1200 mg via ORAL
  Filled 2019-02-03 (×7): qty 2

## 2019-02-03 MED ORDER — POTASSIUM CHLORIDE CRYS ER 20 MEQ PO TBCR
20.0000 meq | EXTENDED_RELEASE_TABLET | ORAL | Status: AC
  Administered 2019-02-03 (×3): 20 meq via ORAL
  Filled 2019-02-03 (×3): qty 1

## 2019-02-03 MED ORDER — DILTIAZEM HCL-DEXTROSE 100-5 MG/100ML-% IV SOLN (PREMIX)
5.0000 mg/h | INTRAVENOUS | Status: DC
  Administered 2019-02-03: 5 mg/h via INTRAVENOUS
  Filled 2019-02-03 (×3): qty 100

## 2019-02-03 MED ORDER — ENOXAPARIN SODIUM 40 MG/0.4ML ~~LOC~~ SOLN
40.0000 mg | SUBCUTANEOUS | Status: DC
  Administered 2019-02-03 – 2019-02-05 (×3): 40 mg via SUBCUTANEOUS
  Filled 2019-02-03 (×3): qty 0.4

## 2019-02-03 MED ORDER — POTASSIUM CHLORIDE 10 MEQ/50ML IV SOLN
10.0000 meq | INTRAVENOUS | Status: DC
  Administered 2019-02-03: 10 meq via INTRAVENOUS
  Filled 2019-02-03: qty 50

## 2019-02-03 NOTE — Progress Notes (Signed)
Pt HR sustaining in the 160's-170's, denies shortness of breath or chest pain. Pt told to cough multiple times without success. On call surgeon notified. Orders for cardizem gtt.

## 2019-02-03 NOTE — Progress Notes (Addendum)
      [address]       [place], [address]             [phone]      1 Day Post-Op Procedure(s) (LRB): VIDEO ASSISTED THORACOSCOPY (VATS)/THOROCOTOMY (Left) LUNG RESECTION (Left) Subjective: He states he feels dizzy this morning. He also says he congested.  Objective: Vital signs in last 24 hours: Temp:  [97.2 F (36.2 C)-98.7 F (37.1 C)] 98.6 F (37 C) (09/04 0358) Pulse Rate:  [28-156] 81 (09/04 0700) Cardiac Rhythm: Atrial fibrillation (09/04 0400) Resp:  [7-31] 16 (09/04 0700) BP: (87-150)/(61-92) 135/92 (09/04 0700) SpO2:  [90 %-100 %] 98 % (09/04 0700) Arterial Line BP: (76-183)/(46-125) 143/76 (09/04 0700)     Intake/Output from previous day: 09/03 0701 - 09/04 0700 In: 2012.7 [P.O.:1000; I.V.:912.7; IV Piggyback:100] Out: 2005 [Urine:1705; Blood:50; Chest Tube:250] Intake/Output this shift: No intake/output data recorded.  General appearance: alert, cooperative and no distress Heart: irregularly irregular rhythm Lungs: clear to auscultation bilaterally Abdomen: soft, non-tender; bowel sounds normal; no masses,  no organomegaly Extremities: extremities normal, atraumatic, no cyanosis or edema Wound: clean and dry  Lab Results: Recent Labs    02/03/19 0439  WBC 9.6  HGB 11.4*  HCT 35.0*  PLT 173   BMET:  Recent Labs    02/03/19 0439  NA 137  K 3.4*  CL 106  CO2 22  GLUCOSE 97  BUN 9  CREATININE 0.79  CALCIUM 8.7*    PT/INR: No results for input(s): LABPROT, INR in the last 72 hours. ABG    Component Value Date/Time   PHART 7.408 01/30/2019 1548   HCO3 23.3 01/30/2019 1548   TCO2 21 (L) 10/26/2018 1005   ACIDBASEDEF 0.7 01/30/2019 1548   O2SAT 98.1 01/30/2019 1548   CBG (last 3)  Recent Labs    02/02/19 2004 02/02/19 2359 02/03/19 0356  GLUCAP 173* 80 120*    Assessment/Plan: S/P Procedure(s) (LRB): VIDEO ASSISTED THORACOSCOPY (VATS)/THOROCOTOMY (Left) LUNG RESECTION (Left)  1. CV-hx of atrial  fibrillation. In rate-controlled afib this morning (90s) with occasionally rate into the 150s. He has SCDs in place. Might want to start lovenox.  He was not on anything other than xarelto for his afib pre-op which we will resume once the chest tube is removed. Resumed Metoprolol PO.  2. Pulm- CXR is stable. Will change chest tube to water seal. Continue DuoNebs.   3. Renal-creatinine 0.79, potassium 3.4- will replace. Will order a mag. 4. H and H- 11.4/35.0, expected acute blood loss anemia. Will continue to trend. 5. Endo-blood glucose mostly controlled.  6. Added Lovenox 40mg  daily for DVT prophylaxis. 7. Mucinex ordered for congestion.  Plan: Discontinue arterial line and place chest tube to water seal. Check a mag and replace if needed. EKG showed afib rate in the 90s.      LOS: 1 day    Elgie Collard 02/03/2019 CXR clear No air leak on water seal Doing well  patient examined and medical record reviewed,agree with above note. Tharon Aquas Trigt III 02/03/2019

## 2019-02-03 NOTE — Progress Notes (Signed)
Patient's HR went up to the 150's when dangle at bedside around 530am. HR remain high until patient got back in bed. Now 80's -90's at rest.

## 2019-02-03 NOTE — Discharge Summary (Signed)
[address]       [place] [address]             [phone]      Physician Discharge Summary  Patient ID: Vincent Velez MRN: [id number] DOB/AGE: 76-24-1942 76 y.o.  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 There are borderline enlarged lymph nodes in the anterior mediastinum and aortopulmonary window which have low-grade hypermetabolic activity which is similar to or below the blood pool activity and therefore not diagnostic of metastatic carcinoma.  He subsequently underwent flexible bronchoscopy and mediastinoscopy with lymph node biopsies.  All the lymph node biopsies from the mediastinum were negative for metastatic carcinoma.  The lesion is not an ideal location for CT-guided needle biopsy.  His pulmonary function showed adequate lung function to allow wedge resection or left upper  lobectomy so I thought the best option was to proceed with surgical resection.  Hospital Course:   Mr. Vincent Velez underwent a left video-assisted thoracoscopy with a wedge resection of the left upper lobe lung nodule with Dr. Cyndia Bent.  He tolerated the procedure well and was transferred to the surgical ICU for continued care.  He was extubated in a timely manner.  Postop day 1 she had an episode of atrial fibrillation with a rate in the 150s.  We started Lovenox daily.  The patient was on Xarelto preoperatively but we will hold off on restarting until chest tubes are removed.  His chest x-ray was stable therefore we switched him terminate to waterseal.  We resumed his home dose of metoprolol for atrial fibrillation.  We started Mucinex for his congestion.  We continue to wean his oxygen as tolerated.  The patient's chest tube was transitioned to water seal as able.  The chest tube was ultimately removed on 02/04/2019.  Follow up CXR showed no evidence of pneumothorax.  He did have some Atrial fibrillation with RVR.  He was treated with IV and has been started on oral Cardizem for this.  He remains in rate controlled A. Fib which is chronic for him.  He is ambulating independently.  His incision is healing without evidence of infection.  He is medically stable for discharge home today.  Consults: None  Significant Diagnostic Studies:   CLINICAL DATA:  Status post thoracotomy and left lung wedge resection on 02/02/2019.  EXAM: PORTABLE CHEST 1 VIEW  COMPARISON:  02/02/2019  FINDINGS: Postsurgical changes are again noted with a left chest  tube remaining in place and terminating near the lung apex. The left jugular catheter has been removed. The cardiomediastinal silhouette is unchanged with prior aortic valve replacement again noted. The lungs remain hypoinflated, however lung volumes are slightly improved from the prior study with improved aeration of the left lung base. There is mild central  pulmonary vascular congestion without overt edema. No confluent airspace opacity, sizeable pleural effusion, or pneumothorax is identified. Aortic atherosclerosis is noted.  IMPRESSION: 1. Improved aeration of the left lung base. 2. Left chest tube in place without evidence of a pneumothorax. 3. Interval removal of left jugular catheter.   Electronically Signed   By: Logan Bores M.D.   On: 02/03/2019 08:56   Pathology: Lung, wedge biopsy/resection, Left Upper Lobe - ADENOCARCINOMA, 2.7 CM. - CARCINOMA INVADES PLEURAL CONNECTIVE TISSUE. - MARGIN NOT INVOLVED.  Treatments:   CARDIOTHORACIC SURGERY OPERATIVE NOTE  02/02/2019 [name] [id number]  Surgeon:  Gaye Pollack, MD  First Assistant: Nicholes Rough, PA-C   Preoperative Diagnosis:  Left upper lobe lung mass   Postoperative Diagnosis: Adenocarcinoma LUL  Procedure:  1. Left video-assisted thoracoscopy 2. Left muscle-sparing thoracotomy 3. Wedge resection of left upper lobe lung nodule  Anesthesia:  General Endotracheal   Clinical History/Surgical Indication:  The patient has a 2.6 x 2.2 cm mass in the left upper lobe with a maximum SUV of 4.7 that is highly suspicious for bronchogenic carcinoma. He has some mediastinal adenopathy which was biopsied on mediastinoscopy and all the biopsies were negative for metastatic carcinoma. His pulmonary function testing is adequate to allow wedge resection or left upper lobectomy if needed for surgical resection of this lesion. I discussed the procedure of left video-assisted thoracoscopy or thoracotomy for surgical resection with the patient. I discussed this the benefits and risk of surgery including but not limited to bleeding, blood transfusion, infection, prolonged air leak from the lung, postoperative respiratory insufficiency, and recurrent cancer. He understands all this and agrees to proceed.   Discharge Exam: Blood pressure (!) 137/99,  pulse 96, temperature (!) 97.5 F (36.4 C), temperature source Oral, resp. rate 19, height _0  (1.753 m), weight 79.8 kg, SpO2 100 %.   General appearance: alert, cooperative and no distress Heart: irregularly irregular rhythm Lungs: clear to auscultation bilaterally Abdomen: soft, non-tender; bowel sounds normal; no masses,  no organomegaly Extremities: extremities normal, atraumatic, no cyanosis or edema Wound: clean and dry  Discharge disposition: 01-Home or Self Care  Discharge Medications:  Allergies as of 02/06/2019   No Known Allergies     Medication List    STOP taking these medications   oxyCODONE-acetaminophen 5-325 MG tablet Commonly known as: Percocet     TAKE these medications   acetaminophen 500 MG tablet Commonly known as: TYLENOL Take 2 tablets (1,000 mg total) by mouth every 6 (six) hours as needed for mild pain or fever.   amoxicillin 500 MG tablet Commonly known as: AMOXIL Take 2,000 mg (four tablets) 1 hour prior to all dental visits. There are enough tablets for 2 dental visits in this bottle. What changed:   how much to take  how to take this  when to take this  additional instructions   aspirin 81 MG chewable tablet Chew 1 tablet (81 mg total) by mouth daily.   diltiazem 60 MG 12 hr capsule Commonly known as: CARDIZEM SR Take 1 capsule (60 mg total) by mouth every 12 (twelve) hours.   dorzolamide 2 % ophthalmic solution Commonly known as: TRUSOPT  Place 1 drop into both eyes 3 (three) times daily.   esomeprazole 40 MG capsule Commonly known as: NEXIUM Take 40 mg by mouth daily.   glimepiride 2 MG tablet Commonly known as: AMARYL Take 2 mg by mouth daily.   metFORMIN 500 MG 24 hr tablet Commonly known as: GLUCOPHAGE-XR Take 1,000 mg by mouth every evening.   metoprolol succinate 25 MG 24 hr tablet Commonly known as: TOPROL-XL Take 1 tablet (25 mg total) by mouth daily.   multivitamin with minerals tablet Take 1 tablet by  mouth daily.   oxyCODONE 5 MG immediate release tablet Commonly known as: Oxy IR/ROXICODONE Take 1-2 tablets (5-10 mg total) by mouth every 4 (four) hours as needed for moderate pain.   simvastatin 40 MG tablet Commonly known as: ZOCOR Take 40 mg by mouth at bedtime.   Xarelto 20 MG Tabs tablet Generic drug: rivaroxaban TAKE 1 TABLET BY MOUTH  DAILY WITH SUPPER What changed: See the new instructions.      Follow-up Information    [name], MD. Call in 1 day(s).   Specialty: Internal Medicine Contact information: [address]  [place] [address] [phone]        Gaye Pollack, MD Follow up.   Specialty: Cardiothoracic Surgery Why: Your routine follow-up appointment is on  9/23 _0 :30p. Please arrive at 3:00pm for a chest xray located at [facility] which is on the first floor of our building.  Contact information: [address]  [place] [address] [place], [place] Follow up on 02/07/2019.   Specialty: Cardiology Why: at 3:00 pm  call the office number above for directions on parking.  this is at the hospital.  Contact information: [place] [address] [phone]        [facility] Follow up on 02/13/2019.   Specialty: Cardiothoracic Surgery Why: Appointment is at 10:00 with nurse for suture removal Contact information: [address] [place], [place] [phone]          Signed:  Original Note By Nicholes Rough PA-C  Updated by:  Ellwood Handler, PA-C  02/06/2019, 7:48 AM

## 2019-02-04 ENCOUNTER — Inpatient Hospital Stay (HOSPITAL_COMMUNITY): Payer: Medicare Other

## 2019-02-04 LAB — CBC
HCT: 35.1 % — ABNORMAL LOW (ref 39.0–52.0)
Hemoglobin: 11.4 g/dL — ABNORMAL LOW (ref 13.0–17.0)
MCH: 31 pg (ref 26.0–34.0)
MCHC: 32.5 g/dL (ref 30.0–36.0)
MCV: 95.4 fL (ref 80.0–100.0)
Platelets: 155 10*3/uL (ref 150–400)
RBC: 3.68 MIL/uL — ABNORMAL LOW (ref 4.22–5.81)
RDW: 14.3 % (ref 11.5–15.5)
WBC: 9.3 10*3/uL (ref 4.0–10.5)
nRBC: 0 % (ref 0.0–0.2)

## 2019-02-04 LAB — GLUCOSE, CAPILLARY
Glucose-Capillary: 118 mg/dL — ABNORMAL HIGH (ref 70–99)
Glucose-Capillary: 121 mg/dL — ABNORMAL HIGH (ref 70–99)
Glucose-Capillary: 124 mg/dL — ABNORMAL HIGH (ref 70–99)
Glucose-Capillary: 124 mg/dL — ABNORMAL HIGH (ref 70–99)
Glucose-Capillary: 221 mg/dL — ABNORMAL HIGH (ref 70–99)
Glucose-Capillary: 91 mg/dL (ref 70–99)
Glucose-Capillary: 93 mg/dL (ref 70–99)

## 2019-02-04 LAB — BASIC METABOLIC PANEL
Anion gap: 7 (ref 5–15)
BUN: 8 mg/dL (ref 8–23)
CO2: 23 mmol/L (ref 22–32)
Calcium: 8.7 mg/dL — ABNORMAL LOW (ref 8.9–10.3)
Chloride: 108 mmol/L (ref 98–111)
Creatinine, Ser: 0.67 mg/dL (ref 0.61–1.24)
GFR calc Af Amer: >60 mL/min (ref >60)
GFR calc non Af Amer: >60 mL/min (ref >60)
Glucose, Bld: 92 mg/dL (ref 70–99)
Potassium: 3.8 mmol/L (ref 3.5–5.1)
Sodium: 138 mmol/L (ref 135–145)

## 2019-02-04 LAB — MAGNESIUM: Magnesium: 2 mg/dL (ref 1.7–2.4)

## 2019-02-04 IMAGING — DX DG CHEST 1V PORT
1 series · 1 of 1 positions shown · non-contrast
Comparison: Single-view of the chest [DATE] and [DATE].

CLINICAL DATA: Patient status post left thoracoscopy [DATE] 4
left upper lobe mass.

EXAM:
PORTABLE CHEST 1 VIEW

[chest ap]
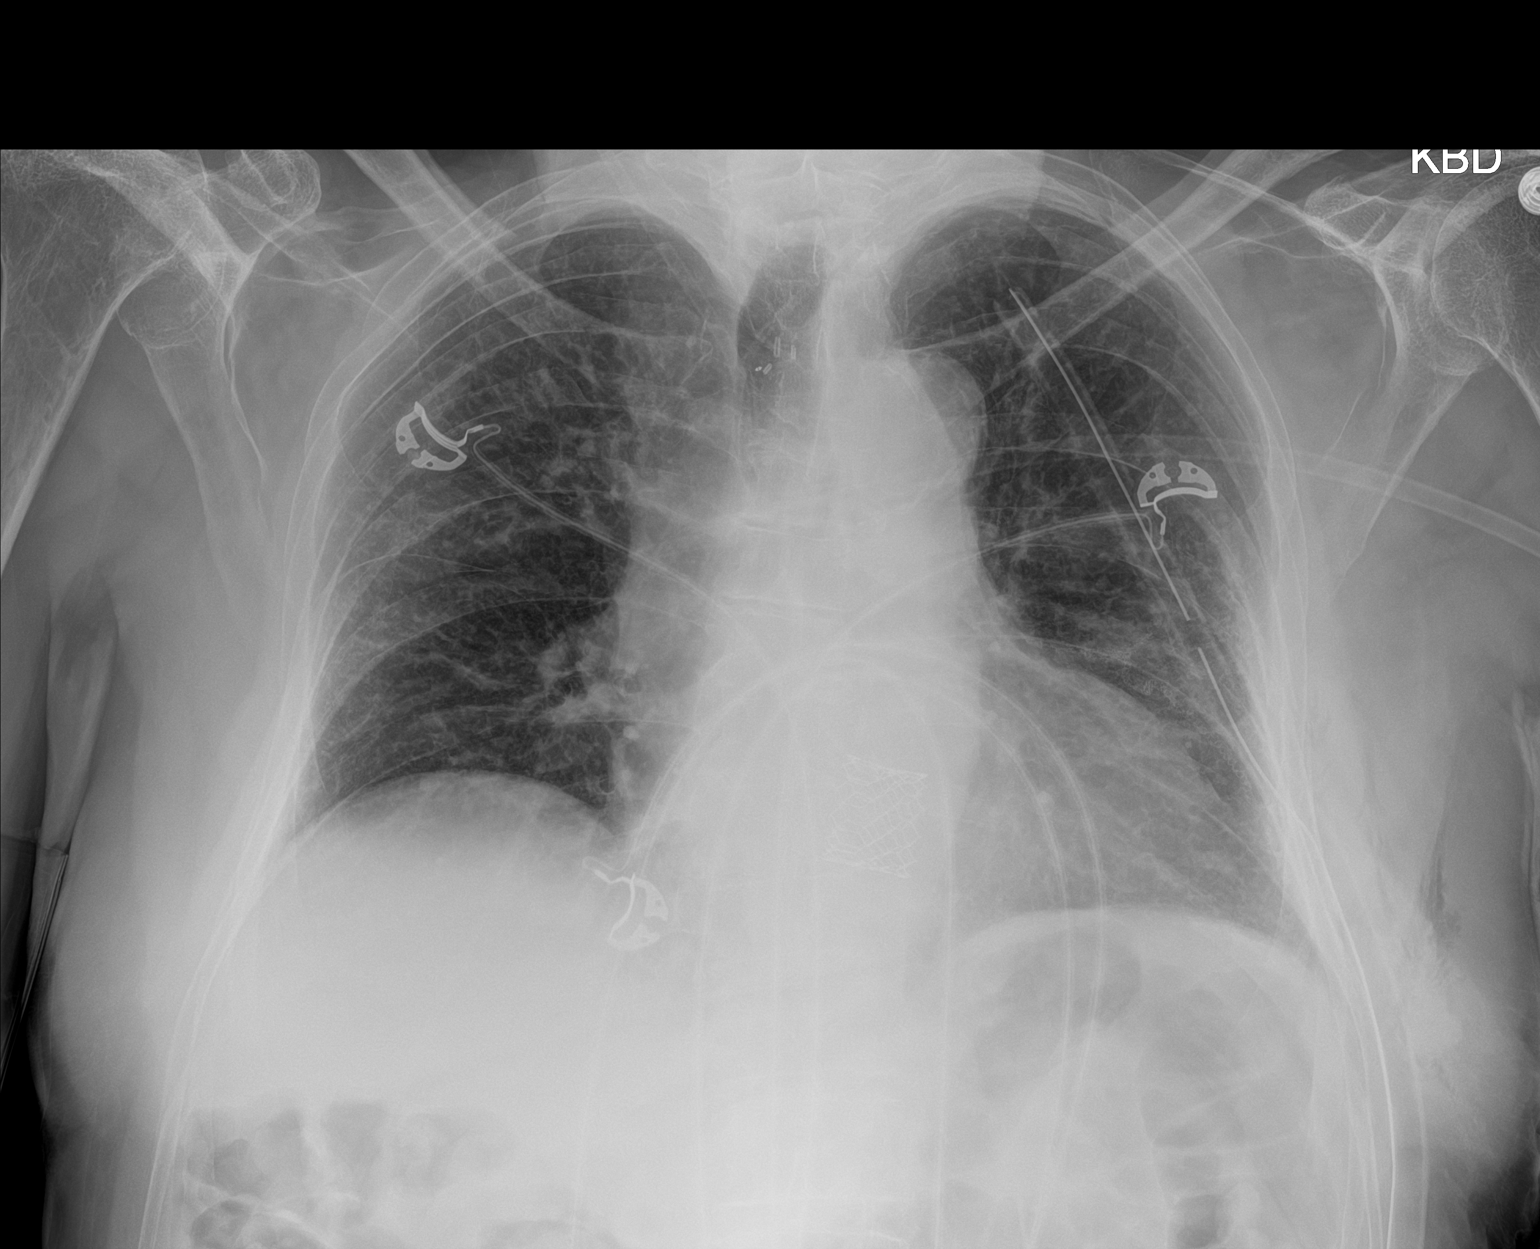

[1 of 1 positions shown; findings below may reference images not displayed]

FINDINGS: Left chest tube remains in place. No pneumothorax. Lungs are clear.
No pleural effusion. Heart size is normal. Subcutaneous emphysema
left chest wall is decreased. The patient is status post aortic
valve replacement. Atherosclerosis noted.
IMPRESSION: No acute disease. Negative for pneumothorax with a left chest tube
in place.

## 2019-02-04 MED ORDER — DILTIAZEM HCL ER 60 MG PO CP12
60.0000 mg | ORAL_CAPSULE | Freq: Two times a day (BID) | ORAL | Status: DC
  Administered 2019-02-04 – 2019-02-06 (×4): 60 mg via ORAL
  Filled 2019-02-04 (×5): qty 1

## 2019-02-04 NOTE — Progress Notes (Addendum)
      [address]       [place] [address]             [phone]      2 Days Post-Op Procedure(s) (LRB): VIDEO ASSISTED THORACOSCOPY (VATS)/THOROCOTOMY (Left) LUNG RESECTION (Left)   Subjective:  No new complaints.  Has a nose bleed currently.   Objective: Vital signs in last 24 hours: Temp:  [97.2 F (36.2 C)-98.9 F (37.2 C)] 97.2 F (36.2 C) (09/05 1553) Pulse Rate:  [58-167] 81 (09/05 1400) Cardiac Rhythm: Atrial fibrillation (09/05 1200) Resp:  [9-29] 13 (09/05 1400) BP: (111-148)/(69-107) 144/79 (09/05 1400) SpO2:  [94 %-100 %] 98 % (09/05 1431) Weight:  [79.8 kg] 79.8 kg (09/05 0446)  Intake/Output from previous day: 09/04 0701 - 09/05 0700 In: 1741.2 [I.V.:1741.2] Out: 6900 [Urine:6730; Chest Tube:170] Intake/Output this shift: Total I/O In: 1098.1 [P.O.:480; I.V.:618.1] Out: 390 [Urine:350; Chest Tube:40]  General appearance: alert, cooperative and no distress Heart: irregularly irregular rhythm Lungs: clear to auscultation bilaterally Abdomen: soft, non-tender; bowel sounds normal; no masses,  no organomegaly Extremities: extremities normal, atraumatic, no cyanosis or edema Wound: clean and dry  Lab Results: Recent Labs    02/03/19 0439 02/04/19 0345  WBC 9.6 9.3  HGB 11.4* 11.4*  HCT 35.0* 35.1*  PLT 173 155   BMET:  Recent Labs    02/03/19 0439 02/04/19 0345  NA 137 138  K 3.4* 3.8  CL 106 108  CO2 22 23  GLUCOSE 97 92  BUN 9 8  CREATININE 0.79 0.67  CALCIUM 8.7* 8.7*    PT/INR: No results for input(s): LABPROT, INR in the last 72 hours. ABG    Component Value Date/Time   PHART 7.379 02/03/2019 0424   HCO3 23.2 02/03/2019 0424   TCO2 24 02/03/2019 0424   ACIDBASEDEF 2.0 02/03/2019 0424   O2SAT 97.0 02/03/2019 0424   CBG (last 3)  Recent Labs    02/04/19 0812 02/04/19 1150 02/04/19 1555  GLUCAP 221* 118* 121*    Assessment/Plan: S/P Procedure(s) (LRB): VIDEO ASSISTED THORACOSCOPY  (VATS)/THOROCOTOMY (Left) LUNG RESECTION (Left)  1. CV- chronic Atrial Fibrillation, Bp controlled- on home regimen of Toprol. Will hold off on xarelto for now due to nose bleed 2. Pulm- chest tube 170 cc output yesterday, no air leak present, CXR without pneumothorax, will d/c chest tube today 3. Renal- creatinine remains stable, K improved to 3.8 4. Expected post operative blood loss anemia, mild hgb stable at 11.4 5. DM-cbgs okay, will restart oral medications tomorrow if oral intake is stable 6. dispo- patient stable, currently in rate controlled A. Fib, chronic, no air leak no pneumothorax will d/c chest tube, transfer to [place], repeat CXR in AM   LOS: 2 days    Ellwood Handler 02/04/2019  Recovering well from right VATS Chest tube removed today Rapid A. fib better controlled with IV Cardizem overnight We will add oral Cardizem to metoprolol for control of chronic atrial fibrillation Probably transfer to stepdown tomorrow

## 2019-02-05 ENCOUNTER — Inpatient Hospital Stay (HOSPITAL_COMMUNITY): Payer: Medicare Other

## 2019-02-05 LAB — GLUCOSE, CAPILLARY
Glucose-Capillary: 111 mg/dL — ABNORMAL HIGH (ref 70–99)
Glucose-Capillary: 226 mg/dL — ABNORMAL HIGH (ref 70–99)
Glucose-Capillary: 89 mg/dL (ref 70–99)

## 2019-02-05 IMAGING — DX DG CHEST 2V
2 series · 2 of 2 positions shown · non-contrast
Comparison: Single-view of the chest [DATE].

CLINICAL DATA: Patient status post left thoracoscopy [DATE]
left upper lobe mass. Chest tube removed.

EXAM:
CHEST - 2 VIEW

[chest lat]
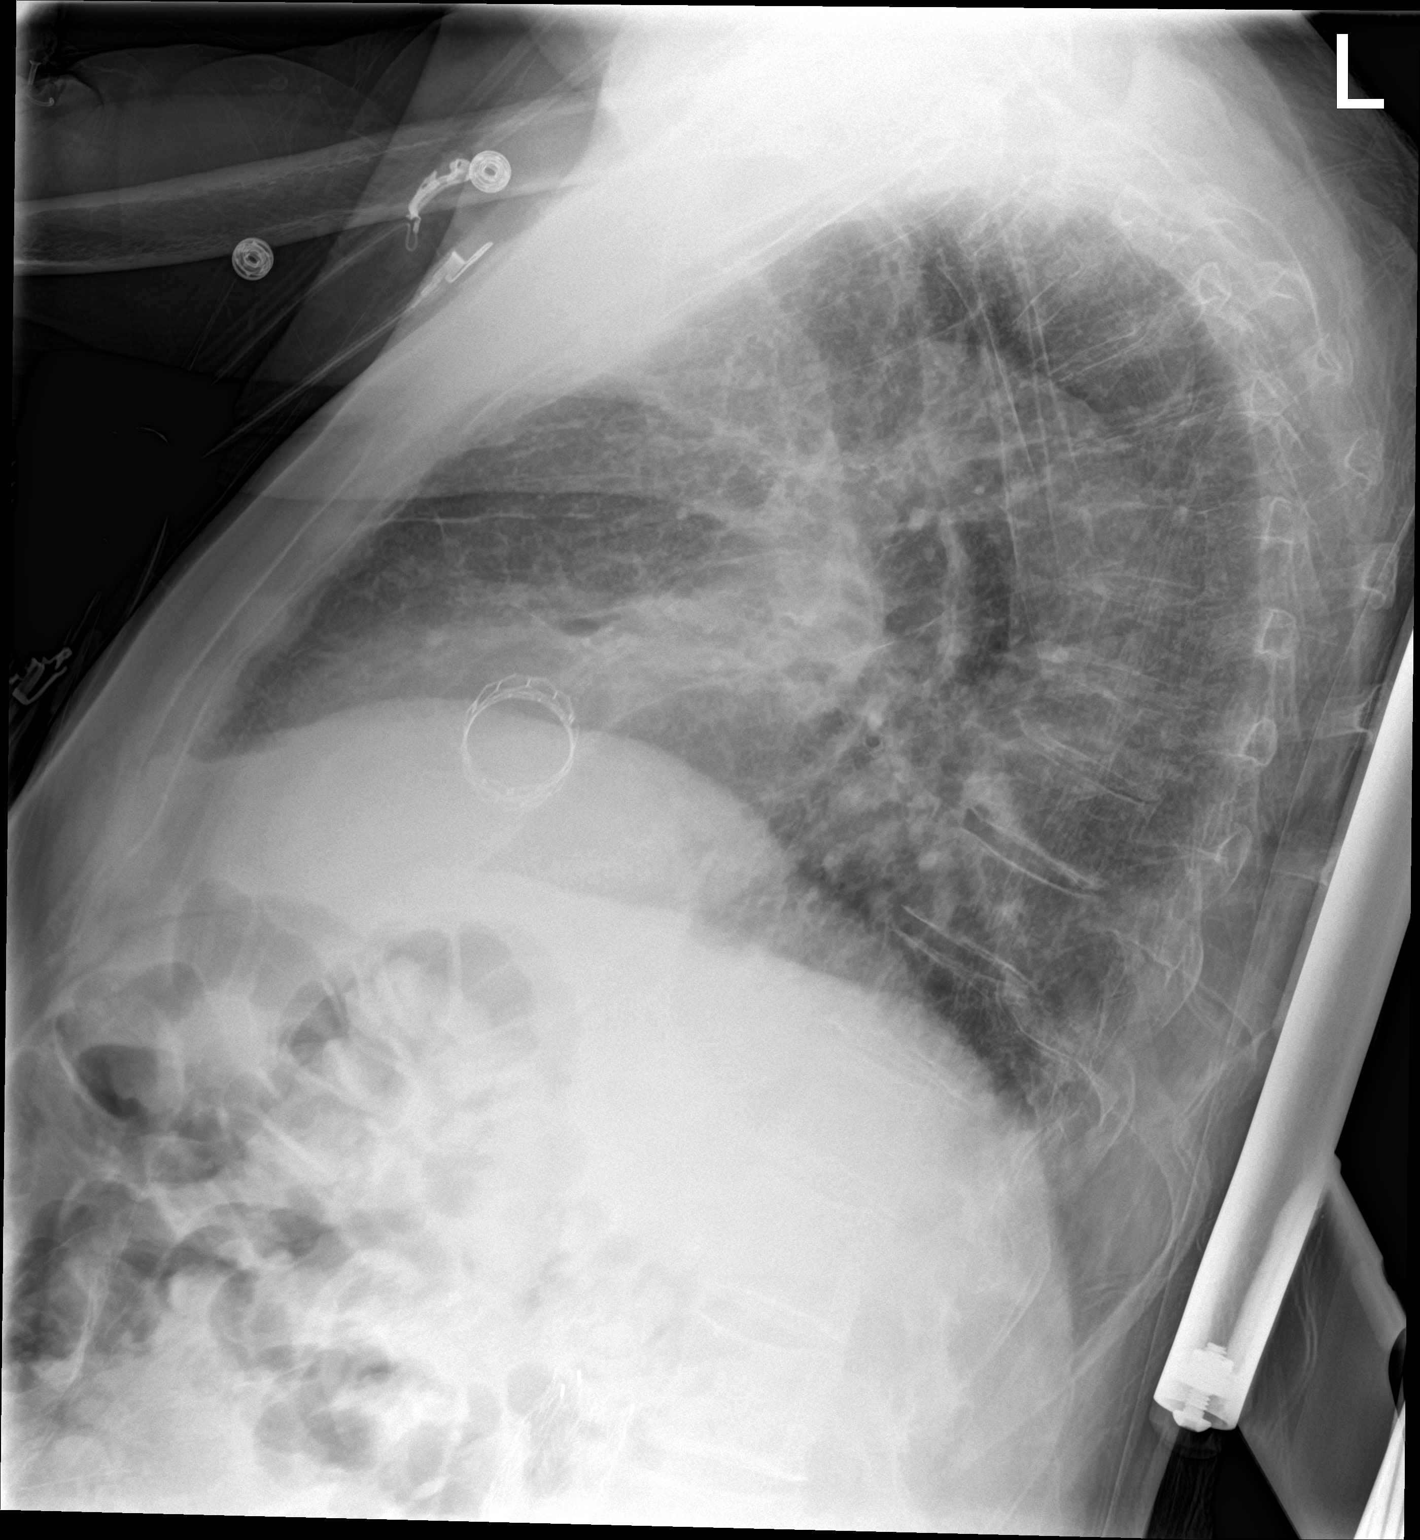

[chest ap]
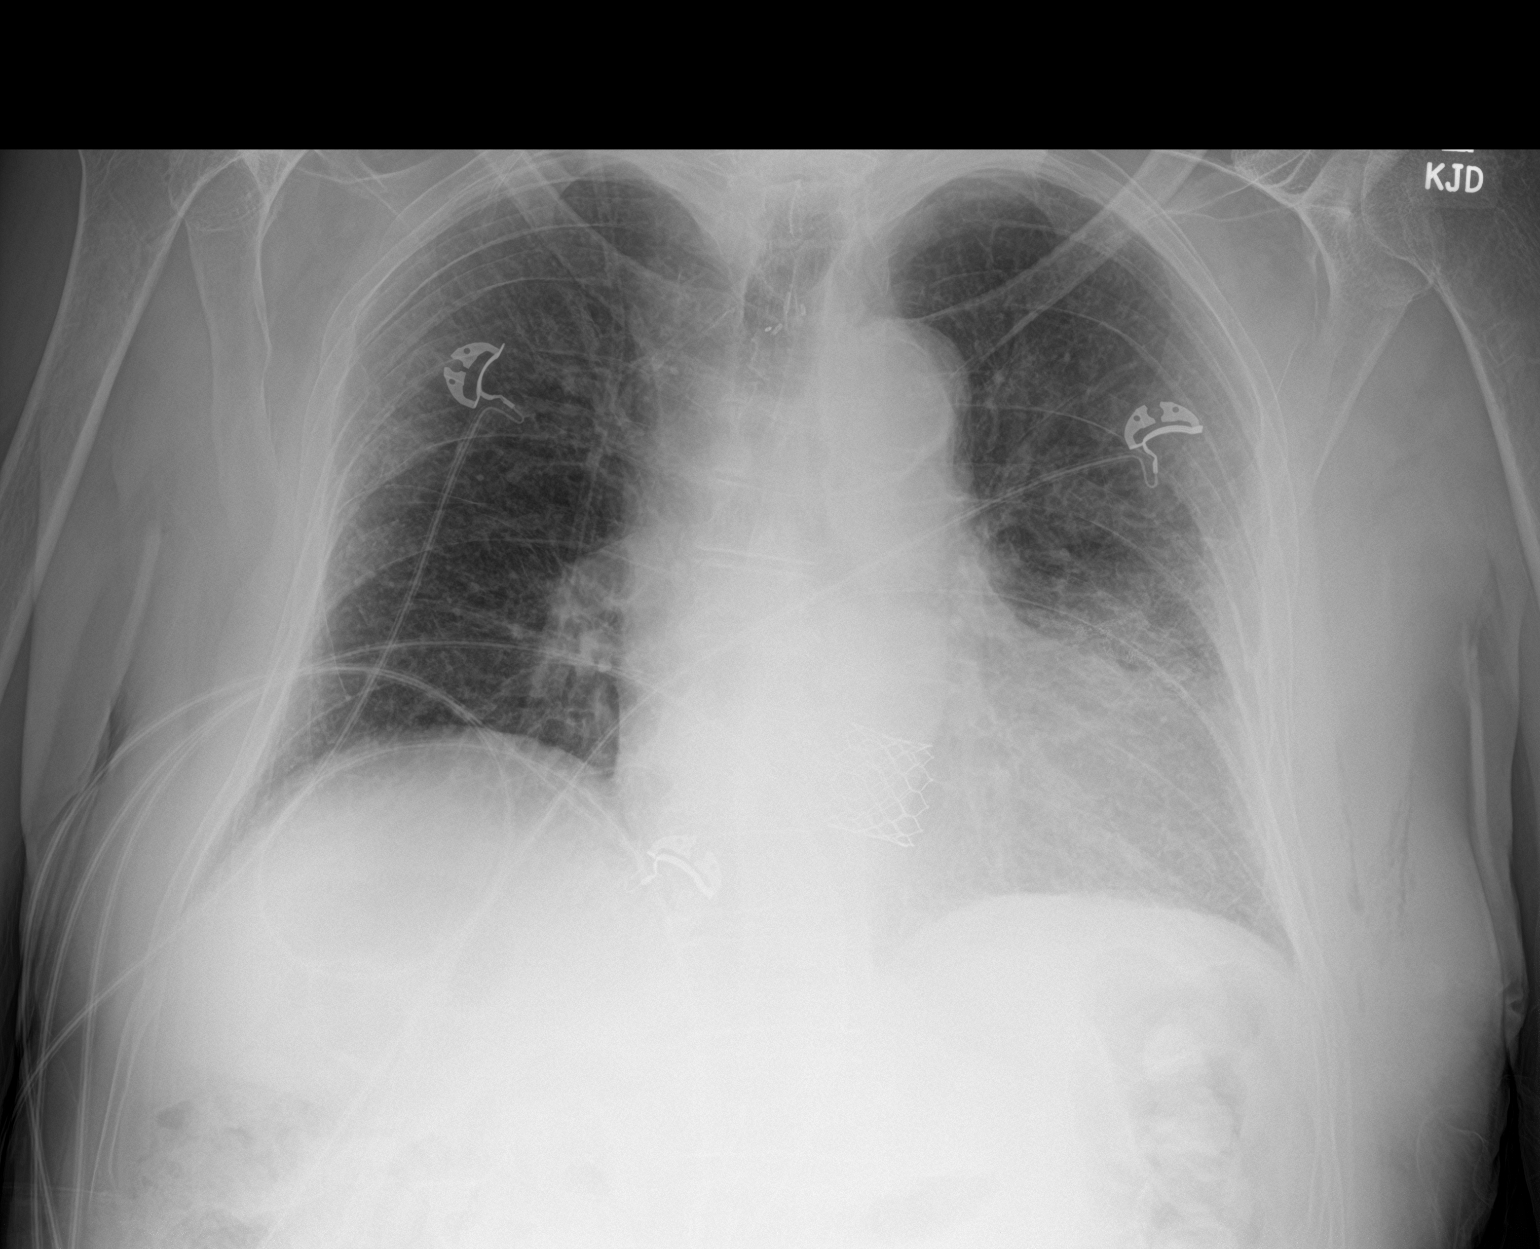

[2 of 2 positions shown; findings below may reference images not displayed]

FINDINGS: Left chest tube has been removed. No pneumothorax. Mild left basilar
atelectasis. Right lung is clear. There is some coarsening of the
pulmonary interstitium. Heart size is upper normal. Aortic
atherosclerosis. Small amount of subcutaneous emphysema left chest
wall noted. The patient is status post aortic valve replacement.
IMPRESSION: Status post left chest tube removal. Negative for pneumothorax. No
new abnormality.

## 2019-02-05 MED ORDER — RIVAROXABAN 20 MG PO TABS
20.0000 mg | ORAL_TABLET | Freq: Every day | ORAL | Status: DC
  Administered 2019-02-05 – 2019-02-06 (×2): 20 mg via ORAL
  Filled 2019-02-05 (×2): qty 1

## 2019-02-05 MED ORDER — IPRATROPIUM-ALBUTEROL 0.5-2.5 (3) MG/3ML IN SOLN: 3.0000 mL | Freq: Four times a day (QID) | RESPIRATORY_TRACT | Status: DC | PRN

## 2019-02-05 NOTE — Progress Notes (Signed)
3 Days Post-Op Procedure(s) (LRB): VIDEO ASSISTED THORACOSCOPY (VATS)/THOROCOTOMY (Left) LUNG RESECTION (Left) Subjective: Atrial fibrillation rate controlled with addition of oral Cardizem I am.  Will resume preop dose of Xarelto Chest x-ray is clear today Patient ambulating on room air Surgical incision clean and dry Plan for discharge home tomorrow Objective: Vital signs in last 24 hours: Temp:  [97.2 F (36.2 C)-98.9 F (37.2 C)] 98.2 F (36.8 C) (09/06 0827) Pulse Rate:  [72-114] 79 (09/06 1000) Cardiac Rhythm: Atrial fibrillation (09/06 0800) Resp:  [11-25] 13 (09/06 1000) BP: (98-165)/(68-113) 143/81 (09/06 1112) SpO2:  [92 %-100 %] 94 % (09/06 1000)  Hemodynamic parameters for last 24 hours:  Stable atrial fibrillation  Intake/Output from previous day: 09/05 0701 - 09/06 0700 In: 3093.9 [P.O.:1200; I.V.:1893.9] Out: 3070 [Urine:3000; Chest Tube:70] Intake/Output this shift: Total I/O In: 464.9 [P.O.:240; I.V.:224.9] Out: -   Exam Alert and comfortable Lungs clear Chronic atrial fibrillation, rate controlled Surgical incision clean and dry  Lab Results: Recent Labs    02/03/19 0439 02/04/19 0345  WBC 9.6 9.3  HGB 11.4* 11.4*  HCT 35.0* 35.1*  PLT 173 155   BMET:  Recent Labs    02/03/19 0439 02/04/19 0345  NA 137 138  K 3.4* 3.8  CL 106 108  CO2 22 23  GLUCOSE 97 92  BUN 9 8  CREATININE 0.79 0.67  CALCIUM 8.7* 8.7*    PT/INR: No results for input(s): LABPROT, INR in the last 72 hours. 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 fib   LOS: 3 days    Vincent Velez 02/05/2019

## 2019-02-06 MED ORDER — ACETAMINOPHEN 500 MG PO TABS: 1000.0000 mg | ORAL_TABLET | Freq: Four times a day (QID) | ORAL | 0 refills | Status: DC | PRN

## 2019-02-06 MED ORDER — OXYCODONE HCL 5 MG PO TABS: 5.0000 mg | ORAL_TABLET | ORAL | 0 refills | Status: DC | PRN

## 2019-02-06 MED ORDER — DILTIAZEM HCL ER 60 MG PO CP12: 60.0000 mg | ORAL_CAPSULE | Freq: Two times a day (BID) | ORAL | 3 refills | Status: DC

## 2019-02-06 NOTE — Progress Notes (Addendum)
      [address]       [place] [address]             [phone]      4 Days Post-Op Procedure(s) (LRB): VIDEO ASSISTED THORACOSCOPY (VATS)/THOROCOTOMY (Left) LUNG RESECTION (Left)   Subjective:  No new complaints.  Sitting up in chair, ready to go home.  Objective: Vital signs in last 24 hours: Temp:  [97.5 F (36.4 C)-98.3 F (36.8 C)] 97.5 F (36.4 C) (09/07 0423) Pulse Rate:  [72-96] 96 (09/07 0423) Cardiac Rhythm: Atrial fibrillation (09/06 2100) Resp:  [12-22] 19 (09/07 0423) BP: (98-143)/(68-99) 137/99 (09/07 0423) SpO2:  [92 %-100 %] 100 % (09/07 0423)  Intake/Output from previous day: 09/06 0701 - 09/07 0700 In: 511.8 [P.O.:240; I.V.:271.8] Out: 1600 [Urine:1600]  General appearance: alert, cooperative and no distress Heart: irregularly irregular rhythm Lungs: clear to auscultation bilaterally Abdomen: soft, non-tender; bowel sounds normal; no masses,  no organomegaly Extremities: extremities normal, atraumatic, no cyanosis or edema Wound: clean and dry  Lab Results: Recent Labs    02/04/19 0345  WBC 9.3  HGB 11.4*  HCT 35.1*  PLT 155   BMET:  Recent Labs    02/04/19 0345  NA 138  K 3.8  CL 108  CO2 23  GLUCOSE 92  BUN 8  CREATININE 0.67  CALCIUM 8.7*    PT/INR: No results for input(s): LABPROT, INR in the last 72 hours. ABG    Component Value Date/Time   PHART 7.379 02/03/2019 0424   HCO3 23.2 02/03/2019 0424   TCO2 24 02/03/2019 0424   ACIDBASEDEF 2.0 02/03/2019 0424   O2SAT 97.0 02/03/2019 0424   CBG (last 3)  Recent Labs    02/05/19 0420 02/05/19 0826 02/05/19 1147  GLUCAP 111* 226* 89    Assessment/Plan: S/P Procedure(s) (LRB): VIDEO ASSISTED THORACOSCOPY (VATS)/THOROCOTOMY (Left) LUNG RESECTION (Left)  1. CV- A. Fib, chronic- continue Toprol, Cardizem, will restart Xarelto  2. Pulm- no acute issues, off oxygen, continue IS 3. Dispo- patient stable, in rate controlled A. Fib which is chronic,  will restart Xarelto today, he is medically stable for discharge home today   LOS: 4 days    Ellwood Handler 02/06/2019  CXR looks good DC instructions reviewed with patient patient examined and medical record reviewed,agree with above note. Tharon Aquas Trigt III 02/06/2019

## 2019-02-06 NOTE — Progress Notes (Signed)
Patient walked approximately 470 ft. Patient tolerated well. HR did increase to 150's but did not sustain. Patient's HR quicley reduce to 110-120's. Patient asymptomatic. Patient returned to room with out any problems. Will continue to monitor

## 2019-02-06 NOTE — Discharge Instructions (Signed)
Discharge Instructions:  1. You may shower, please wash incisions daily with soap and water and keep dry.  If you wish to cover wounds with dressing you may do so but please keep clean and change daily.  No tub baths or swimming until incisions have completely healed.  If your incisions become red or develop any drainage please call our office at [phone]  2. No Driving until cleared by our office and you are no longer using narcotic pain medications  3. Fever of 101.5 for at least 24 hours, please contact our office at [phone]  4. Activity- up as tolerated, please walk at least 3 times per day.  Avoid strenuous activity, no lifting, pushing, or pulling with your arms over 8-10 lbs for a minimum of 6 weeks  5. If any questions or concerns arise, please do not hesitate to contact our office at [phone]

## 2019-02-06 NOTE — Plan of Care (Signed)
  Problem: Clinical Measurements: Goal: Will remain free from infection Outcome: Progressing   Problem: Nutrition: Goal: Adequate nutrition will be maintained Outcome: Progressing   Problem: Elimination: Goal: Will not experience complications related to urinary retention Outcome: Progressing   Problem: Elimination: Goal: Will not experience complications related to bowel motility Outcome: Not Progressing

## 2019-02-07 ENCOUNTER — Encounter (HOSPITAL_COMMUNITY): Payer: Self-pay | Admitting: Physician Assistant

## 2019-02-07 ENCOUNTER — Other Ambulatory Visit: Payer: Self-pay

## 2019-02-07 ENCOUNTER — Ambulatory Visit (HOSPITAL_COMMUNITY)
Admit: 2019-02-07 | Discharge: 2019-02-07 | Disposition: A | Discharge status: Home / Self Care | Payer: Medicare Other | Source: Ambulatory Visit | Attending: Nurse Practitioner | Admitting: Nurse Practitioner

## 2019-02-07 VITALS — BP 134/84 | HR 94 | Ht 69.0 in | Wt 179.0 lb

## 2019-02-07 DIAGNOSIS — I4821 Permanent atrial fibrillation: Secondary | ICD-10-CM | POA: Diagnosis not present

## 2019-02-07 DIAGNOSIS — E785 Hyperlipidemia, unspecified: Secondary | ICD-10-CM | POA: Insufficient documentation

## 2019-02-07 DIAGNOSIS — Z7982 Long term (current) use of aspirin: Secondary | ICD-10-CM | POA: Insufficient documentation

## 2019-02-07 DIAGNOSIS — I2510 Atherosclerotic heart disease of native coronary artery without angina pectoris: Secondary | ICD-10-CM | POA: Diagnosis not present

## 2019-02-07 DIAGNOSIS — E118 Type 2 diabetes mellitus with unspecified complications: Secondary | ICD-10-CM | POA: Diagnosis not present

## 2019-02-07 DIAGNOSIS — I4892 Unspecified atrial flutter: Secondary | ICD-10-CM | POA: Insufficient documentation

## 2019-02-07 DIAGNOSIS — Z7901 Long term (current) use of anticoagulants: Secondary | ICD-10-CM | POA: Diagnosis not present

## 2019-02-07 DIAGNOSIS — I10 Essential (primary) hypertension: Secondary | ICD-10-CM | POA: Diagnosis not present

## 2019-02-07 DIAGNOSIS — F17210 Nicotine dependence, cigarettes, uncomplicated: Secondary | ICD-10-CM | POA: Insufficient documentation

## 2019-02-07 DIAGNOSIS — J449 Chronic obstructive pulmonary disease, unspecified: Secondary | ICD-10-CM | POA: Diagnosis not present

## 2019-02-07 DIAGNOSIS — Z7984 Long term (current) use of oral hypoglycemic drugs: Secondary | ICD-10-CM | POA: Insufficient documentation

## 2019-02-07 DIAGNOSIS — K449 Diaphragmatic hernia without obstruction or gangrene: Secondary | ICD-10-CM | POA: Insufficient documentation

## 2019-02-07 DIAGNOSIS — M1990 Unspecified osteoarthritis, unspecified site: Secondary | ICD-10-CM | POA: Diagnosis not present

## 2019-02-07 DIAGNOSIS — Z79899 Other long term (current) drug therapy: Secondary | ICD-10-CM | POA: Insufficient documentation

## 2019-02-07 DIAGNOSIS — R011 Cardiac murmur, unspecified: Secondary | ICD-10-CM | POA: Insufficient documentation

## 2019-02-07 NOTE — Patient Instructions (Signed)
Increase metoprolol to 50mg  once a day (2 of your 25mg  once a day)

## 2019-02-07 NOTE — Progress Notes (Signed)
Primary Care Physician: [name], MD Primary Cardiologist: Dr Marlou Porch (Dr Burman Foster & Dr Cyndia Bent TAVR) Primary Electrophysiologist: none Referring Physician: Naoki Migliaccio is a 78 y.o. 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  The patient was initially diagnosed with atrial fibrillation in 2017 and underwent DCCV but had ERAF. At that point, patient did not want to pursue any AAD or procedures. He has been permanently in afib or flutter since then. He recently underwent wedge resection for his lung mass and was found to have elevated heart rates at times. He was started on diltiazem at discharge. Patient notes that he feels stronger today since leaving the hospital. He is unaware of his arrhythmia.   Today, he denies symptoms of palpitations, chest pain, shortness of breath, orthopnea, PND, lower extremity edema, dizziness, presyncope, syncope, snoring, daytime somnolence, bleeding, or neurologic sequela. The patient is tolerating medications without difficulties and is otherwise without complaint today.    Atrial Fibrillation Risk Factors:  he does not have symptoms or diagnosis of sleep apnea. he does not have a history of rheumatic fever. he does not have a history of alcohol use. The patient does have a history of early familial atrial fibrillation or other arrhythmias. Mother had afib and PPM.  he has a BMI of Body mass index is 26.43 kg/m.Marland Kitchen Filed Weights   02/07/19 1534  Weight: 81.2 kg    Family History  Problem Relation Age of Onset  . Colon cancer Father        questionable  . Diabetes Mother   . Heart disease Mother        Heart Disease before age 69  . Deep vein thrombosis  Mother   . Hyperlipidemia Mother   . Hypertension Mother   . Varicose Veins Mother   . Hypertension Brother   . Heart attack Brother   . Prostate cancer Brother   . Colon polyps Neg Hx   . Esophageal cancer Neg Hx   . Rectal cancer Neg Hx   . Stomach cancer Neg Hx      Atrial Fibrillation Management history:  Previous antiarrhythmic drugs: none Previous cardioversions: 2017 Previous ablations: none CHADS2VASC score: 5 Anticoagulation history: Xarelto   Past Medical History:  Diagnosis Date  . AAA (abdominal aortic aneurysm) ([place])   . Anxiety   . Arthritis   . Asthma    when younger  . Atrial fibrillation ([place])   . CAD (coronary artery disease)   . Cataract    removed both  . COPD (chronic obstructive pulmonary disease) ([place])   . Diabetes mellitus    Type II  . Dysrhythmia    afib  . Esophageal reflux   . Family history of malignant neoplasm of gastrointestinal tract   . Heart murmur   . Hiatal hernia   . Hyperlipemia   . Hypertension   . Lung nodule    a. PET scan highly suspicious for malignancy. Bronch with biopsy to be done after TAVR  . Malignant neoplasm of prostate ([place])    prostate   . Peripheral vascular disease ([place])   . Pneumonia   . Stricture and stenosis of esophagus    Past Surgical History:  Procedure Laterality Date  . ABDOMINAL AORTA STENT    . CARDIAC  CATHETERIZATION    . CARDIOVERSION N/A 12/04/2015   Procedure: CARDIOVERSION;  Surgeon: Sanda Klein, MD;  [facility];  Service: Cardiovascular;  Laterality: N/A;  . COLONOSCOPY    . FINGER SURGERY Right    middle finger- amputation  . INSERTION PROSTATE RADIATION SEED    . MEDIASTINOSCOPY N/A 01/06/2019   Procedure: MEDIASTINOSCOPY WITH BIOPIES;  Surgeon: Gaye Pollack, MD;  [facility]: [facility];  Service: Thoracic;  Laterality: N/A;  . RIGHT/LEFT HEART CATH AND CORONARY ANGIOGRAPHY N/A 10/26/2018   Procedure: RIGHT/LEFT HEART CATH AND CORONARY ANGIOGRAPHY;  Surgeon: [place] Blanks, MD;  Location: [place] [facility];  Service: Cardiovascular;  Laterality: N/A;  . TEE WITHOUT CARDIOVERSION N/A 12/06/2018   Procedure: TRANSESOPHAGEAL ECHOCARDIOGRAM (TEE);  Surgeon: [place] Blanks, MD;  Location: [place] [facility];  Service: Open Heart Surgery;  Laterality: N/A;  . TRANSCATHETER AORTIC VALVE REPLACEMENT, TRANSFEMORAL N/A 12/06/2018   Procedure: TRANSCATHETER AORTIC VALVE REPLACEMENT, TRANSFEMORAL;  Surgeon: [place] Blanks, MD;  Location: [place] [facility] [facility];  Service: Open Heart Surgery;  Laterality: N/A;  . UPPER GASTROINTESTINAL ENDOSCOPY    . VIDEO ASSISTED THORACOSCOPY (VATS)/THOROCOTOMY Left 02/02/2019   Procedure: VIDEO ASSISTED THORACOSCOPY (VATS)/THOROCOTOMY;  Surgeon: Gaye Pollack, MD;  Location: [facility];  Service: Thoracic;  Laterality: Left;  Marland Kitchen VIDEO BRONCHOSCOPY N/A 01/06/2019   Procedure: VIDEO BRONCHOSCOPY;  Surgeon: Gaye Pollack, MD;  Location: [facility] OR;  Service: Thoracic;  Laterality: N/A;  . WEDGE RESECTION Left 02/02/2019   Procedure: LUNG RESECTION;  Surgeon: Gaye Pollack, MD;  [facility];  Service: Thoracic;  Laterality: Left;    Current Outpatient Medications  Medication Sig Dispense Refill  . acetaminophen (TYLENOL) 500 MG tablet Take 2 tablets (1,000 mg total) by mouth every 6 (six) hours as needed for mild pain or fever. 30 tablet 0  . amoxicillin (AMOXIL) 500 MG tablet Take 2,000 mg (four tablets) 1 hour prior to all dental visits. There are enough tablets for 2 dental visits in this bottle. (Patient taking differently: Take 2,000 mg by mouth See admin instructions. Take 2,000 mg by mouth 1 hour prior to all dental visits. There are enough tablets for 2 dental visits in this bottle.) 8 tablet 11  . aspirin 81 MG chewable tablet Chew 1 tablet (81 mg total) by mouth daily.    Marland Kitchen diltiazem (CARDIZEM SR) 60 MG 12 hr capsule Take 1 capsule (60 mg total) by mouth every 12 (twelve) hours. 60 capsule 3  . dorzolamide (TRUSOPT) 2  % ophthalmic solution Place 1 drop into both eyes 3 (three) times daily.     Marland Kitchen esomeprazole (NEXIUM) 40 MG capsule Take 40 mg by mouth daily.     Marland Kitchen glimepiride (AMARYL) 2 MG tablet Take 2 mg by mouth daily.    . metFORMIN (GLUCOPHAGE-XR) 500 MG 24 hr tablet Take 1,000 mg by mouth every evening.     . metoprolol succinate (TOPROL-XL) 25 MG 24 hr tablet Take 50 mg by mouth daily.    . Multiple Vitamins-Minerals (MULTIVITAMIN WITH MINERALS) tablet Take 1 tablet by mouth daily.    Marland Kitchen oxyCODONE (OXY IR/ROXICODONE) 5 MG immediate release tablet Take 1-2 tablets (5-10 mg total) by mouth every 4 (four) hours as needed for moderate pain. 30 tablet 0  . simvastatin (ZOCOR) 40 MG tablet Take 40 mg by mouth at bedtime.      Alveda Reasons 20 MG TABS tablet TAKE 1 TABLET BY MOUTH  DAILY WITH SUPPER (Patient taking  differently: Take 20 mg by mouth daily with supper. ) 90 tablet 1  . triamcinolone cream (KENALOG) 0.5 % APPLY CREAM TOPICALLY TWICE DAILY FOR 7 10 DAYS     No current facility-administered medications for this encounter.     No Known Allergies  Social History   Socioeconomic History  . Marital status: Married    Spouse name: Not on file  . Number of children: 2  . Years of education: Not on file  . Highest education level: Not on file  Occupational History  . Occupation: engineer-retired    Employer: [employer]  . Financial resource strain: Not on file  . Food insecurity    Worry: Not on file    Inability: Not on file  . Transportation needs    Medical: Not on file    Non-medical: Not on file  Tobacco Use  . Smoking status: Light Tobacco Smoker    Types: Pipe, Cigars  . Smokeless tobacco: Never Used  . Tobacco comment: 01/30/2019 no cigars in 2 months, just pipe  Substance and Sexual Activity  . Alcohol use: Not Currently    Alcohol/week: 0.0 standard drinks    Comment: occ  . Drug use: No  . Sexual activity: Not on file  Lifestyle  . Physical activity    Days per  week: Not on file    Minutes per session: Not on file  . Stress: Not on file  Relationships  . Social Herbalist on phone: Not on file    Gets together: Not on file    Attends religious service: Not on file    Active member of club or organization: Not on file    Attends meetings of clubs or organizations: Not on file    Relationship status: Not on file  . Intimate partner violence    Fear of current or ex partner: Not on file    Emotionally abused: Not on file    Physically abused: Not on file    Forced sexual activity: Not on file  Other Topics Concern  . Not on file  Social History Narrative  . Not on file     ROS- All systems are reviewed and negative except as per the HPI above.  Physical Exam: Vitals:   02/07/19 1534  BP: 134/84  Pulse: 94  Weight: 81.2 kg  Height: 5\' 9"  (1.753 m)    GEN- The patient is well appearing elderly male, alert and oriented x 3 today.   Head- normocephalic, atraumatic Eyes-  Sclera clear, conjunctiva pink Ears- hearing intact Oropharynx- clear Neck- supple  Lungs- Clear to ausculation bilaterally, normal work of breathing Heart- irregular rate and rhythm, no murmurs, rubs or gallops  GI- soft, NT, ND, + BS Extremities- no clubbing, cyanosis, or edema MS- no significant deformity or atrophy Skin- no rash or lesion Psych- euthymic mood, full affect Neuro- strength and sensation are intact  Wt Readings from Last 3 Encounters:  02/07/19 81.2 kg  02/04/19 79.8 kg  01/30/19 81.2 kg    EKG today demonstrates afib HR 94, QRS 102, QTc 505  Echo 12/28/18 demonstrated  1. The left ventricle has low normal systolic function, with an ejection fraction of 50-55%. The cavity size was normal. There is moderately increased left ventricular wall thickness. Left ventricular diastolic Doppler parameters are indeterminate. No  evidence of left ventricular regional wall motion abnormalities.  2. Left atrial size was moderately dilated.   3. Right atrial size was  moderately dilated.  4. The mitral valve is abnormal. Mild thickening of the mitral valve leaflet.  5. The tricuspid valve is grossly normal.  6. A 26 an Edwards Edwards Sapien bioprosthetic aortic valve (TAVR) valve is present in the aortic position. Procedure Date: 12/06/2018 Echo findings are consistent with perivalvular leak of the aortic prosthesis.  7. Aortic valve regurgitation is mild by color flow Doppler. No stenosis of the aortic valve.  8. The aorta is abnormal in size and structure.  9. There is mild dilatation of the ascending aorta measuring 39 mm. 10. When compared to the prior study: 12/07/2018: LVEF 50-55%, aorta dilated to 4.4 cm, mild perivalvular AI, mean AOV gradient 19 mmHg.  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  Epic records are reviewed at length today  Assessment and Plan:  1. Permanent atrial fibrillation/flutter Patient has been in [place] since 2017. At that time he did not want to pursue a rhythm strategy. Discussed with him again today and he would prefer to continue with rate control. Will increase his metoprolol to 50 mg daily. Will also plan to place a Zio patch on follow up to evaluate rate control. Will let him recover from his surgeries first. Continue Xarelto 20 mg daily  This patients CHA2DS2-VASc Score and unadjusted Ischemic Stroke Rate (% per year) is equal to 7.2 % stroke rate/year from a score of 5  Above score calculated as 1 point each if present [CHF, HTN, DM, Vascular=MI/PAD/Aortic Plaque, Age if 65-74, or Male] Above score calculated as 2 points each if present [Age > 75, or Stroke/TIA/TE]   2. Severe AS S/p TAVR. Followed by Dr Cyndia Bent.  3. HTN  Stable, med changes as above.  4. CAD No anginal symptoms. Continue present therapy and risk factor modification.   Follow up in the AF clinic in 2 weeks.   [facility] [address] [place], [place] [address] [phone] 02/07/2019 4:41 PM

## 2019-02-13 ENCOUNTER — Encounter (INDEPENDENT_AMBULATORY_CARE_PROVIDER_SITE_OTHER): Payer: Self-pay

## 2019-02-13 ENCOUNTER — Other Ambulatory Visit: Payer: Self-pay

## 2019-02-13 DIAGNOSIS — Z4802 Encounter for removal of sutures: Secondary | ICD-10-CM

## 2019-02-14 ENCOUNTER — Ambulatory Visit (HOSPITAL_COMMUNITY): Payer: Medicare Other | Admitting: Nurse Practitioner

## 2019-02-20 ENCOUNTER — Other Ambulatory Visit: Payer: Self-pay | Admitting: Surgery

## 2019-02-20 DIAGNOSIS — Z952 Presence of prosthetic heart valve: Secondary | ICD-10-CM

## 2019-02-21 ENCOUNTER — Other Ambulatory Visit: Payer: Self-pay

## 2019-02-21 ENCOUNTER — Ambulatory Visit (HOSPITAL_COMMUNITY)
Admission: RE | Admit: 2019-02-21 | Discharge: 2019-02-21 | Disposition: A | Discharge status: Home / Self Care | Payer: Medicare Other | Source: Ambulatory Visit | Attending: Physician Assistant | Admitting: Physician Assistant

## 2019-02-21 VITALS — BP 140/90 | HR 77 | Ht 69.0 in | Wt 176.2 lb

## 2019-02-21 DIAGNOSIS — J449 Chronic obstructive pulmonary disease, unspecified: Secondary | ICD-10-CM | POA: Diagnosis not present

## 2019-02-21 DIAGNOSIS — I4821 Permanent atrial fibrillation: Secondary | ICD-10-CM | POA: Diagnosis not present

## 2019-02-21 DIAGNOSIS — E119 Type 2 diabetes mellitus without complications: Secondary | ICD-10-CM | POA: Insufficient documentation

## 2019-02-21 DIAGNOSIS — Z7982 Long term (current) use of aspirin: Secondary | ICD-10-CM | POA: Insufficient documentation

## 2019-02-21 DIAGNOSIS — I10 Essential (primary) hypertension: Secondary | ICD-10-CM | POA: Diagnosis not present

## 2019-02-21 DIAGNOSIS — I2510 Atherosclerotic heart disease of native coronary artery without angina pectoris: Secondary | ICD-10-CM | POA: Insufficient documentation

## 2019-02-21 DIAGNOSIS — Z952 Presence of prosthetic heart valve: Secondary | ICD-10-CM | POA: Insufficient documentation

## 2019-02-21 DIAGNOSIS — K219 Gastro-esophageal reflux disease without esophagitis: Secondary | ICD-10-CM | POA: Diagnosis not present

## 2019-02-21 DIAGNOSIS — E785 Hyperlipidemia, unspecified: Secondary | ICD-10-CM | POA: Insufficient documentation

## 2019-02-21 DIAGNOSIS — F17290 Nicotine dependence, other tobacco product, uncomplicated: Secondary | ICD-10-CM | POA: Diagnosis not present

## 2019-02-21 DIAGNOSIS — Z79899 Other long term (current) drug therapy: Secondary | ICD-10-CM | POA: Diagnosis not present

## 2019-02-21 DIAGNOSIS — I4819 Other persistent atrial fibrillation: Secondary | ICD-10-CM | POA: Insufficient documentation

## 2019-02-21 DIAGNOSIS — Z955 Presence of coronary angioplasty implant and graft: Secondary | ICD-10-CM | POA: Insufficient documentation

## 2019-02-21 DIAGNOSIS — Z7901 Long term (current) use of anticoagulants: Secondary | ICD-10-CM | POA: Diagnosis not present

## 2019-02-21 DIAGNOSIS — Z8546 Personal history of malignant neoplasm of prostate: Secondary | ICD-10-CM | POA: Diagnosis not present

## 2019-02-21 DIAGNOSIS — Z7984 Long term (current) use of oral hypoglycemic drugs: Secondary | ICD-10-CM | POA: Insufficient documentation

## 2019-02-21 DIAGNOSIS — I4891 Unspecified atrial fibrillation: Secondary | ICD-10-CM | POA: Diagnosis present

## 2019-02-21 NOTE — Progress Notes (Signed)
Primary Care Physician: Vincent Carol, MD Primary Cardiologist: Dr Marlou Porch (Dr Burman Foster & Dr Cyndia Bent TAVR) Primary Electrophysiologist: none Referring Physician: [name] is a 78 y.o. 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  The patient was initially diagnosed with atrial fibrillation in 2017 and underwent DCCV but had ERAF. At that point, patient did not want to pursue any AAD or procedures. He has been permanently in afib or flutter since then. He recently underwent wedge resection for his lung mass and was found to have elevated heart rates at times. He was started on diltiazem at discharge. Patient notes that he feels stronger today since leaving the hospital.   On follow up today, patient reports that he is slowly improving although not back to his baseline. He has not had any heart racing symptoms.   Today, he denies symptoms of palpitations, chest pain, shortness of breath, orthopnea, PND, lower extremity edema, dizziness, presyncope, syncope, snoring, daytime somnolence, bleeding, or neurologic sequela. The patient is tolerating medications without difficulties and is otherwise without complaint today.    Atrial Fibrillation Risk Factors:  he does not have symptoms or diagnosis of sleep apnea. he does not have a history of rheumatic fever. he does not have a history of alcohol use. The patient does have a history of early familial atrial fibrillation or other arrhythmias. Mother had afib and PPM.  he has a BMI of Body mass index is 26.02 kg/m.Marland Kitchen Filed Weights   02/21/19 1348  Weight: 79.9 kg    Family History  Problem Relation Age of Onset  . Colon cancer Father    questionable  . Diabetes Mother   . Heart disease Mother        Heart Disease before age 27  . Deep vein thrombosis Mother   . Hyperlipidemia Mother   . Hypertension Mother   . Varicose Veins Mother   . Hypertension Brother   . Heart attack Brother   . Prostate cancer Brother   . Colon polyps Neg Hx   . Esophageal cancer Neg Hx   . Rectal cancer Neg Hx   . Stomach cancer Neg Hx      Atrial Fibrillation Management history:  Previous antiarrhythmic drugs: none Previous cardioversions: 2017 Previous ablations: none CHADS2VASC score: 5 Anticoagulation history: Xarelto   Past Medical History:  Diagnosis Date  . AAA (abdominal aortic aneurysm) ([place])   . Anxiety   . Arthritis   . Asthma    when younger  . Atrial fibrillation ([place])   . CAD (coronary artery disease)   . Cataract    removed both  . COPD (chronic obstructive pulmonary disease) ([place])   . Diabetes mellitus    Type II  . Dysrhythmia    afib  . Esophageal reflux   . Family history of malignant neoplasm of gastrointestinal tract   . Heart murmur   . Hiatal hernia   . Hyperlipemia   . Hypertension   . Lung nodule    a. PET scan highly suspicious for malignancy. Bronch with biopsy to be done after TAVR  . Malignant neoplasm of prostate ([place])    prostate   . Peripheral vascular disease ([place])   . Pneumonia   . Stricture and stenosis of esophagus    Past  Surgical History:  Procedure Laterality Date  . ABDOMINAL AORTA STENT    . CARDIAC CATHETERIZATION    . CARDIOVERSION N/A 12/04/2015   Procedure: CARDIOVERSION;  Surgeon: Sanda Klein, MD;  [facility];  Service: Cardiovascular;  Laterality: N/A;  . COLONOSCOPY    . FINGER SURGERY Right    middle finger- amputation  . INSERTION PROSTATE RADIATION SEED    . MEDIASTINOSCOPY N/A 01/06/2019   Procedure: MEDIASTINOSCOPY WITH BIOPIES;  Surgeon: Gaye Pollack, MD;  [facility] OR;  Service: Thoracic;  Laterality: N/A;  . RIGHT/LEFT HEART CATH AND  CORONARY ANGIOGRAPHY N/A 10/26/2018   Procedure: RIGHT/LEFT HEART CATH AND CORONARY ANGIOGRAPHY;  Surgeon: [place]ell Blanks, MD;  Location: [place] [facility];  Service: Cardiovascular;  Laterality: N/A;  . TEE WITHOUT CARDIOVERSION N/A 12/06/2018   Procedure: TRANSESOPHAGEAL ECHOCARDIOGRAM (TEE);  Surgeon: Burnell Blanks, MD;  Location: [place] [facility];  Service: Open Heart Surgery;  Laterality: N/A;  . TRANSCATHETER AORTIC VALVE REPLACEMENT, TRANSFEMORAL N/A 12/06/2018   Procedure: TRANSCATHETER AORTIC VALVE REPLACEMENT, TRANSFEMORAL;  Surgeon: Burnell Blanks, MD;  Location: [facility] [facility];  Service: Open Heart Surgery;  Laterality: N/A;  . UPPER GASTROINTESTINAL ENDOSCOPY    . VIDEO ASSISTED THORACOSCOPY (VATS)/THOROCOTOMY Left 02/02/2019   Procedure: VIDEO ASSISTED THORACOSCOPY (VATS)/THOROCOTOMY;  Surgeon: Gaye Pollack, MD;  Location: [facility];  Service: Thoracic;  Laterality: Left;  Marland Kitchen VIDEO BRONCHOSCOPY N/A 01/06/2019   Procedure: VIDEO BRONCHOSCOPY;  Surgeon: Gaye Pollack, MD;  Location: [facility] OR;  Service: Thoracic;  Laterality: N/A;  . WEDGE RESECTION Left 02/02/2019   Procedure: LUNG RESECTION;  Surgeon: Gaye Pollack, MD;  [facility];  Service: Thoracic;  Laterality: Left;    Current Outpatient Medications  Medication Sig Dispense Refill  . acetaminophen (TYLENOL) 500 MG tablet Take 2 tablets (1,000 mg total) by mouth every 6 (six) hours as needed for mild pain or fever. 30 tablet 0  . amoxicillin (AMOXIL) 500 MG tablet Take 2,000 mg (four tablets) 1 hour prior to all dental visits. There are enough tablets for 2 dental visits in this bottle. (Patient taking differently: Take 2,000 mg by mouth See admin instructions. Take 2,000 mg by mouth 1 hour prior to all dental visits. There are enough tablets for 2 dental visits in this bottle.) 8 tablet 11  . aspirin 81 MG chewable tablet Chew 1 tablet (81 mg total) by mouth daily.    Marland Kitchen diltiazem (CARDIZEM SR) 60 MG  12 hr capsule Take 1 capsule (60 mg total) by mouth every 12 (twelve) hours. 60 capsule 3  . dorzolamide (TRUSOPT) 2 % ophthalmic solution Place 1 drop into both eyes 3 (three) times daily.     Marland Kitchen esomeprazole (NEXIUM) 40 MG capsule Take 40 mg by mouth daily.     Marland Kitchen glimepiride (AMARYL) 2 MG tablet Take 2 mg by mouth daily.    . metFORMIN (GLUCOPHAGE-XR) 500 MG 24 hr tablet Take 1,000 mg by mouth every evening.     . metoprolol succinate (TOPROL-XL) 25 MG 24 hr tablet Take 50 mg by mouth daily.    . Multiple Vitamins-Minerals (MULTIVITAMIN WITH MINERALS) tablet Take 1 tablet by mouth daily.    Marland Kitchen oxyCODONE (OXY IR/ROXICODONE) 5 MG immediate release tablet Take 1-2 tablets (5-10 mg total) by mouth every 4 (four) hours as needed for moderate pain. 30 tablet 0  . simvastatin (ZOCOR) 40 MG tablet Take 40 mg by mouth at bedtime.      Marland Kitchen  XARELTO 20 MG TABS tablet TAKE 1 TABLET BY MOUTH  DAILY WITH SUPPER (Patient taking differently: Take 20 mg by mouth daily with supper. ) 90 tablet 1   No current facility-administered medications for this encounter.     No Known Allergies  Social History   Socioeconomic History  . Marital status: Married    Spouse name: Not on file  . Number of children: 2  . Years of education: Not on file  . Highest education level: Not on file  Occupational History  . Occupation: engineer-retired    Employer: [employer]  . Financial resource strain: Not on file  . Food insecurity    Worry: Not on file    Inability: Not on file  . Transportation needs    Medical: Not on file    Non-medical: Not on file  Tobacco Use  . Smoking status: Light Tobacco Smoker    Types: Pipe, Cigars  . Smokeless tobacco: Never Used  . Tobacco comment: 01/30/2019 no cigars in 2 months, just pipe  Substance and Sexual Activity  . Alcohol use: Not Currently    Alcohol/week: 0.0 standard drinks    Comment: occ  . Drug use: No  . Sexual activity: Not on file  Lifestyle  .  Physical activity    Days per week: Not on file    Minutes per session: Not on file  . Stress: Not on file  Relationships  . Social Herbalist on phone: Not on file    Gets together: Not on file    Attends religious service: Not on file    Active member of club or organization: Not on file    Attends meetings of clubs or organizations: Not on file    Relationship status: Not on file  . Intimate partner violence    Fear of current or ex partner: Not on file    Emotionally abused: Not on file    Physically abused: Not on file    Forced sexual activity: Not on file  Other Topics Concern  . Not on file  Social History Narrative  . Not on file     ROS- All systems are reviewed and negative except as per the HPI above.  Physical Exam: Vitals:   02/21/19 1348  BP: 140/90  Pulse: 77  Weight: 79.9 kg  Height: 5\' 9"  (1.753 m)    GEN- The patient is well appearing elderly male, alert and oriented x 3 today.   HEENT-head normocephalic, atraumatic, sclera clear, conjunctiva pink, hearing intact, trachea midline. Lungs- Clear to ausculation bilaterally, normal work of breathing Heart- irregular rate and rhythm, no murmurs, rubs or gallops  GI- soft, NT, ND, + BS Extremities- no clubbing, cyanosis, or edema MS- no significant deformity or atrophy Skin- no rash or lesion Psych- euthymic mood, full affect Neuro- strength and sensation are intact  Wt Readings from Last 3 Encounters:  02/21/19 79.9 kg  02/07/19 81.2 kg  02/04/19 79.8 kg    EKG today demonstrates afib HR afib HR 77, QRS 106, QTc 488  Echo 12/28/18 demonstrated  1. The left ventricle has low normal systolic function, with an ejection fraction of 50-55%. The cavity size was normal. There is moderately increased left ventricular wall thickness. Left ventricular diastolic Doppler parameters are indeterminate. No  evidence of left ventricular regional wall motion abnormalities.  2. Left atrial size was  moderately dilated.  3. Right atrial size was moderately dilated.  4. The mitral  valve is abnormal. Mild thickening of the mitral valve leaflet.  5. The tricuspid valve is grossly normal.  6. A 26 an Edwards Edwards Sapien bioprosthetic aortic valve (TAVR) valve is present in the aortic position. Procedure Date: 12/06/2018 Echo findings are consistent with perivalvular leak of the aortic prosthesis.  7. Aortic valve regurgitation is mild by color flow Doppler. No stenosis of the aortic valve.  8. The aorta is abnormal in size and structure.  9. There is mild dilatation of the ascending aorta measuring 39 mm. 10. When compared to the prior study: 12/07/2018: LVEF 50-55%, aorta dilated to 4.4 cm, mild perivalvular AI, mean AOV gradient 19 mmHg.  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  Epic records are reviewed at length today  Assessment and Plan:  1. Permanent atrial fibrillation/flutter Patient has been in [place] since 2017.  Rate controlled. Continue metoprolol to 50 mg daily. Discuss ZIO patch to evaluate rate control. Patient deferred for now given the improvement in his symptoms and that he is rate controlled today. Continue Xarelto 20 mg daily Continue diltiazem 60 mg BID  This patients CHA2DS2-VASc Score and unadjusted Ischemic Stroke Rate (% per year) is equal to 7.2 % stroke rate/year from a score of 5  Above score calculated as 1 point each if present [CHF, HTN, DM, Vascular=MI/PAD/Aortic Plaque, Age if 65-74, or Male] Above score calculated as 2 points each if present [Age > 75, or Stroke/TIA/TE]   2. Severe AS S/p TAVR. Followed by Dr Cyndia Bent.   3. HTN Stable, no changes today.   4. CAD No anginal  symptoms. Continue present therapy and risk factor modification.    Follow up with Dr Cyndia Bent and Dr Marlou Porch as scheduled. AF clinic as needed.    [facility] [address] [place], [place] [address] [phone] 02/21/2019 2:07 PM

## 2019-02-22 ENCOUNTER — Ambulatory Visit (INDEPENDENT_AMBULATORY_CARE_PROVIDER_SITE_OTHER): Payer: Self-pay | Admitting: Surgery

## 2019-02-22 ENCOUNTER — Encounter: Payer: Self-pay | Admitting: Surgery

## 2019-02-22 ENCOUNTER — Ambulatory Visit
Admission: RE | Admit: 2019-02-22 | Discharge: 2019-02-22 | Disposition: A | Discharge status: Home / Self Care | Payer: Medicare Other | Source: Ambulatory Visit | Attending: Surgery | Admitting: Surgery

## 2019-02-22 VITALS — BP 123/73 | HR 84 | Temp 96.6°F | Resp 16 | Ht 69.0 in | Wt 176.0 lb

## 2019-02-22 DIAGNOSIS — C3412 Malignant neoplasm of upper lobe, left bronchus or lung: Secondary | ICD-10-CM

## 2019-02-22 DIAGNOSIS — Z09 Encounter for follow-up examination after completed treatment for conditions other than malignant neoplasm: Secondary | ICD-10-CM

## 2019-02-22 DIAGNOSIS — R918 Other nonspecific abnormal finding of lung field: Secondary | ICD-10-CM | POA: Diagnosis not present

## 2019-02-22 DIAGNOSIS — Z952 Presence of prosthetic heart valve: Secondary | ICD-10-CM

## 2019-02-22 IMAGING — CR DG CHEST 2V
2 series · 2 of 2 positions shown · non-contrast
Comparison: [DATE] and CT [DATE]

CLINICAL DATA: History of TAVR left upper lobe wedge resection.

EXAM:
CHEST - 2 VIEW

[w chest pa]
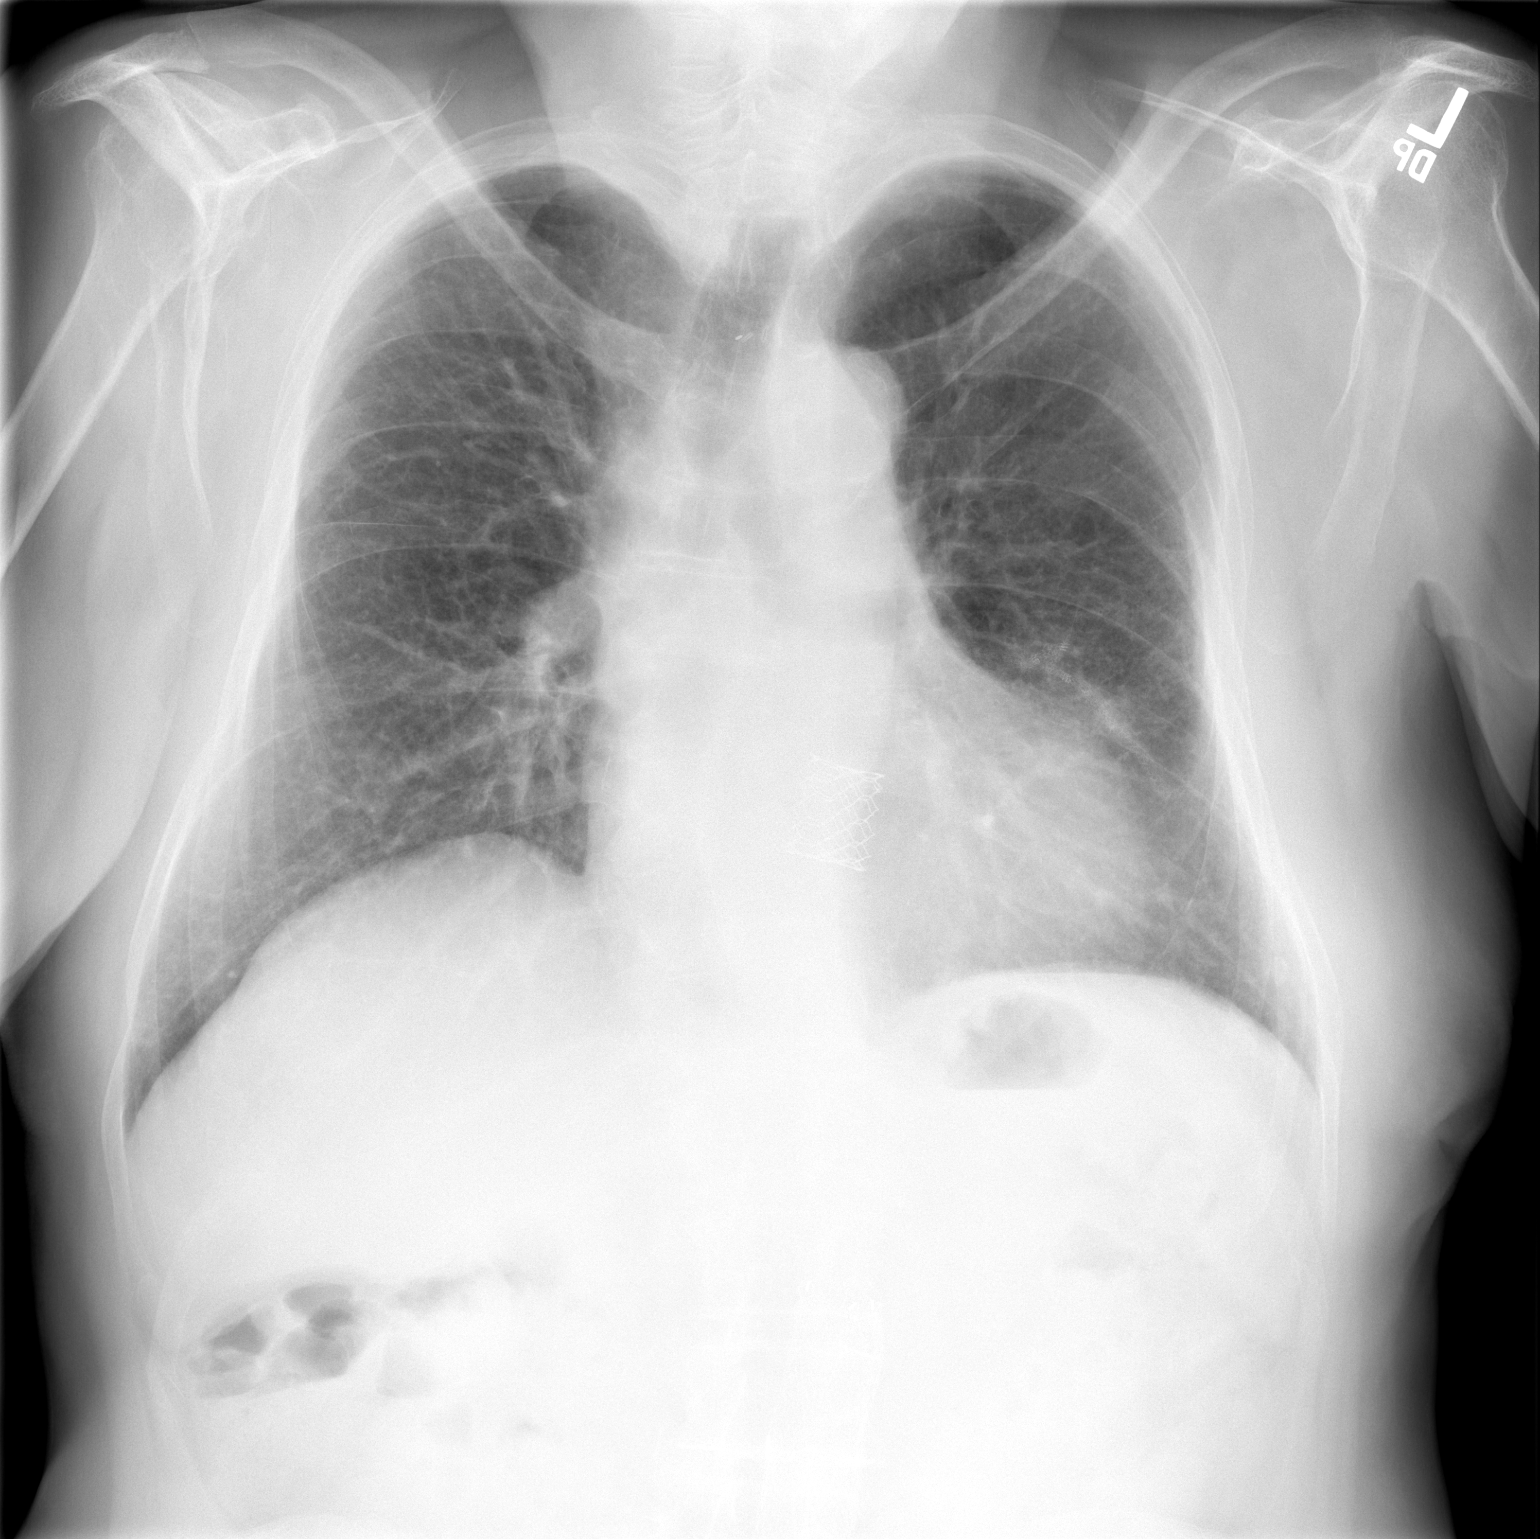

[w chest lat]
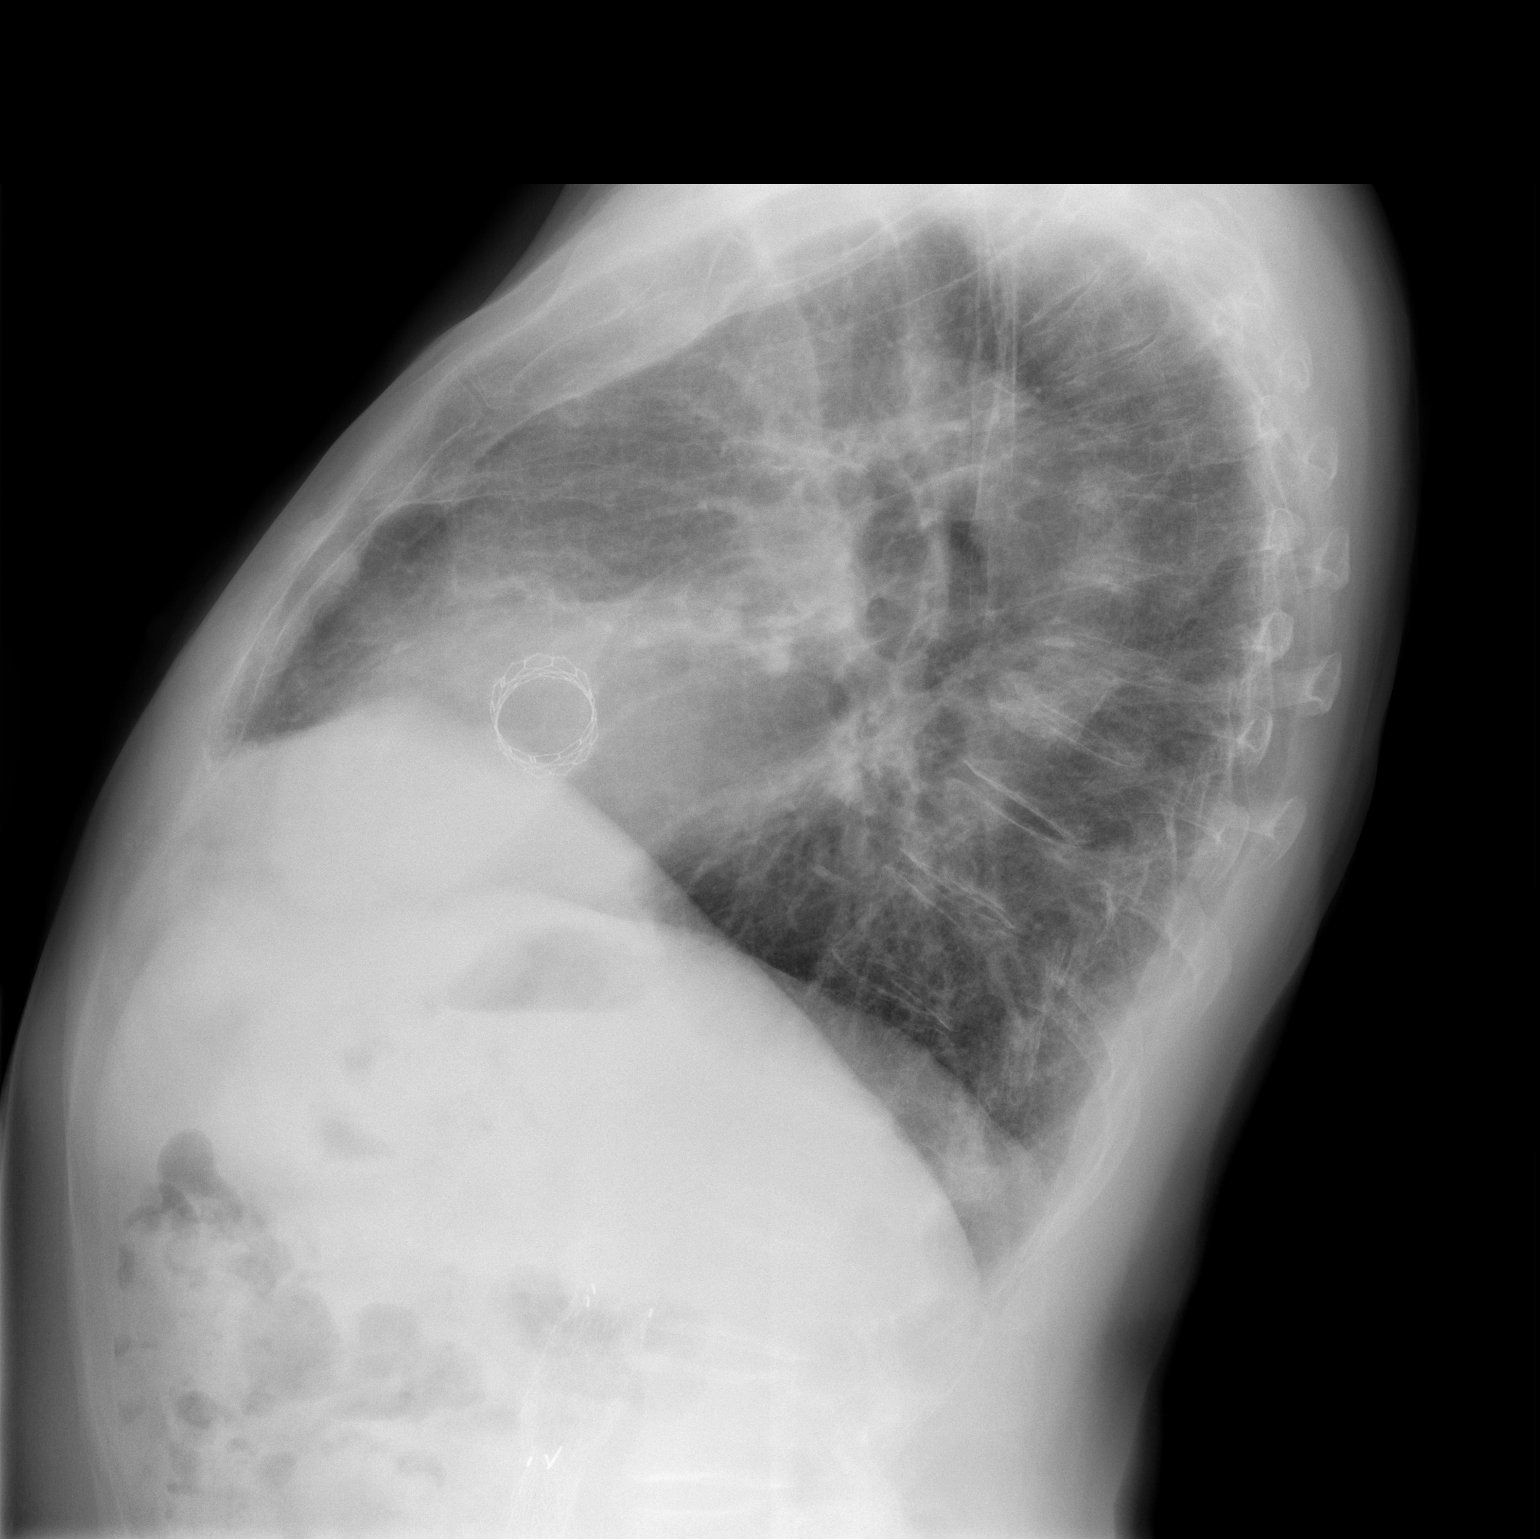

[2 of 2 positions shown; findings below may reference images not displayed]

FINDINGS: Chain sutures are demonstrated in the left chest a sign of recent
wedge resection. Signs of TAVR as before. Cardiomediastinal contours
are stable with signs of potential LVH and cardiac enlargement.

Lungs are clear. Incidental note is made of aortic grafting in the
abdomen, partially imaged. Mild levoconvexity in the upper lumbar
spine without acute bone process.
IMPRESSION: Signs of pulmonary wedge resection, TAVR and abdominal aortic
aneurysm and a grafting without acute cardiopulmonary disease.

## 2019-02-22 NOTE — Progress Notes (Signed)
HPI: Patient returns for routine postoperative follow-up having undergone left muscle-sparing thoracotomy with wedge resection of a left upper lobe lung mass on 02/02/2019.  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 The patient's early postoperative recovery while in the hospital was notable for an uncomplicated postoperative course. Since hospital discharge the patient reports that he has been feeling well overall.  He denies any shortness of breath.  He has mild incisional discomfort.  He said that his stamina is not back to normal but is improving.  He has continued to abstain from smoking.   Current Outpatient Medications  Medication Sig Dispense Refill  . acetaminophen (TYLENOL) 500 MG tablet Take 2 tablets (1,000 mg total) by mouth every 6 (six) hours as needed for mild pain or fever. 30 tablet 0  . amoxicillin (AMOXIL) 500 MG tablet Take 2,000 mg (four tablets) 1 hour prior to all dental visits. There are enough tablets for 2 dental visits in this bottle. (Patient taking differently: Take 2,000 mg by mouth See admin instructions. Take 2,000 mg by mouth 1 hour prior to all dental visits. There are enough tablets for 2 dental visits in this bottle.) 8 tablet 11  . aspirin 81 MG chewable tablet Chew 1 tablet (81 mg total) by mouth daily.    Marland Kitchen diltiazem (CARDIZEM SR) 60 MG 12 hr capsule Take 1 capsule (60 mg total) by mouth every 12 (twelve) hours. 60 capsule 3  . dorzolamide (TRUSOPT) 2 % ophthalmic solution Place 1 drop into both eyes 3 (three) times daily.     Marland Kitchen esomeprazole (NEXIUM) 40 MG capsule Take 40 mg by mouth daily.     Marland Kitchen glimepiride (AMARYL) 2 MG tablet Take 2 mg by mouth daily.    . metFORMIN (GLUCOPHAGE-XR) 500 MG 24 hr tablet Take 1,000 mg by mouth every evening.     . metoprolol succinate (TOPROL-XL) 25 MG 24 hr tablet Take 50 mg by mouth daily.    .  Multiple Vitamins-Minerals (MULTIVITAMIN WITH MINERALS) tablet Take 1 tablet by mouth daily.    . simvastatin (ZOCOR) 40 MG tablet Take 40 mg by mouth at bedtime.      Alveda Reasons 20 MG TABS tablet TAKE 1 TABLET BY MOUTH  DAILY WITH SUPPER (Patient taking differently: Take 20 mg by mouth daily with supper. ) 90 tablet 1   No current facility-administered medications for this visit.     Physical Exam: BP 123/73 (BP Location: Left Arm, Patient Position: Sitting, Cuff Size: Normal)   Pulse 84   Temp (!) 96.6 F (35.9 C)   Resp 16   Ht 5\' 9"  (1.753 m)   Wt 176 lb (79.8 kg)   SpO2 96% Comment: ON RA  BMI 25.99 kg/m  He looks well. Cardiac exam shows a regular rate and rhythm with normal heart sounds. Lung exam is clear. The left chest incision is healing well.  Diagnostic Tests:  CLINICAL DATA:  History of TAVR left upper lobe wedge resection.  EXAM: CHEST - 2 VIEW  COMPARISON:  02/05/2019 and CT 10/31/2018  FINDINGS: Chain sutures are demonstrated in the left chest a sign of recent wedge resection. Signs of TAVR as before. Cardiomediastinal contours are stable with signs of potential LVH and cardiac enlargement.  Lungs are clear. Incidental note is made of aortic grafting in the abdomen, partially imaged. Mild levoconvexity in the upper lumbar spine without acute  bone process.  IMPRESSION: Signs of pulmonary wedge resection, TAVR and abdominal aortic aneurysm and a grafting without acute cardiopulmonary disease.   Electronically Signed   By: Zetta Bills M.D.   On: 02/22/2019 15:29  Impression:  Overall I think he is making a very good recovery following his surgery at 3 weeks postoperatively.  I encouraged him to continue abstaining from smoking.  I asked him not to do any heavy lifting of more than 10 pounds for 6 weeks postoperatively.  Plan:  We will see him back in 6 months for a CT scan of the chest to establish a postoperative baseline for further  surveillance.   Gaye Pollack, MD [facility] [phone]

## 2019-02-23 DIAGNOSIS — C61 Malignant neoplasm of prostate: Secondary | ICD-10-CM | POA: Diagnosis not present

## 2019-02-23 DIAGNOSIS — H409 Unspecified glaucoma: Secondary | ICD-10-CM | POA: Diagnosis not present

## 2019-02-23 DIAGNOSIS — I10 Essential (primary) hypertension: Secondary | ICD-10-CM | POA: Diagnosis not present

## 2019-02-23 DIAGNOSIS — E7800 Pure hypercholesterolemia, unspecified: Secondary | ICD-10-CM | POA: Diagnosis not present

## 2019-02-23 DIAGNOSIS — E119 Type 2 diabetes mellitus without complications: Secondary | ICD-10-CM | POA: Diagnosis not present

## 2019-02-23 DIAGNOSIS — E139 Other specified diabetes mellitus without complications: Secondary | ICD-10-CM | POA: Diagnosis not present

## 2019-02-23 DIAGNOSIS — I480 Paroxysmal atrial fibrillation: Secondary | ICD-10-CM | POA: Diagnosis not present

## 2019-02-23 DIAGNOSIS — E1169 Type 2 diabetes mellitus with other specified complication: Secondary | ICD-10-CM | POA: Diagnosis not present

## 2019-02-23 DIAGNOSIS — E7849 Other hyperlipidemia: Secondary | ICD-10-CM | POA: Diagnosis not present

## 2019-03-21 DIAGNOSIS — H401132 Primary open-angle glaucoma, bilateral, moderate stage: Secondary | ICD-10-CM | POA: Diagnosis not present

## 2019-04-18 DIAGNOSIS — I10 Essential (primary) hypertension: Secondary | ICD-10-CM | POA: Diagnosis not present

## 2019-04-18 DIAGNOSIS — E7800 Pure hypercholesterolemia, unspecified: Secondary | ICD-10-CM | POA: Diagnosis not present

## 2019-04-18 DIAGNOSIS — I480 Paroxysmal atrial fibrillation: Secondary | ICD-10-CM | POA: Diagnosis not present

## 2019-04-18 DIAGNOSIS — H409 Unspecified glaucoma: Secondary | ICD-10-CM | POA: Diagnosis not present

## 2019-04-18 DIAGNOSIS — E1169 Type 2 diabetes mellitus with other specified complication: Secondary | ICD-10-CM | POA: Diagnosis not present

## 2019-04-18 DIAGNOSIS — E119 Type 2 diabetes mellitus without complications: Secondary | ICD-10-CM | POA: Diagnosis not present

## 2019-04-18 DIAGNOSIS — E139 Other specified diabetes mellitus without complications: Secondary | ICD-10-CM | POA: Diagnosis not present

## 2019-04-18 DIAGNOSIS — E7849 Other hyperlipidemia: Secondary | ICD-10-CM | POA: Diagnosis not present

## 2019-04-18 DIAGNOSIS — C61 Malignant neoplasm of prostate: Secondary | ICD-10-CM | POA: Diagnosis not present

## 2019-05-04 ENCOUNTER — Ambulatory Visit: Payer: Medicare Other | Admitting: Cardiology

## 2019-05-09 ENCOUNTER — Other Ambulatory Visit: Payer: Self-pay

## 2019-05-09 DIAGNOSIS — I714 Abdominal aortic aneurysm, without rupture, unspecified: Secondary | ICD-10-CM

## 2019-05-11 ENCOUNTER — Ambulatory Visit (HOSPITAL_COMMUNITY): Payer: Medicare Other

## 2019-05-11 ENCOUNTER — Ambulatory Visit: Payer: Medicare Other | Admitting: Family

## 2019-06-03 ENCOUNTER — Other Ambulatory Visit: Payer: Self-pay | Admitting: Physician Assistant

## 2019-06-06 ENCOUNTER — Other Ambulatory Visit: Payer: Self-pay | Admitting: Physician Assistant

## 2019-06-06 DIAGNOSIS — E119 Type 2 diabetes mellitus without complications: Secondary | ICD-10-CM | POA: Diagnosis not present

## 2019-06-06 DIAGNOSIS — I480 Paroxysmal atrial fibrillation: Secondary | ICD-10-CM | POA: Diagnosis not present

## 2019-06-06 DIAGNOSIS — E1169 Type 2 diabetes mellitus with other specified complication: Secondary | ICD-10-CM | POA: Diagnosis not present

## 2019-06-06 DIAGNOSIS — E139 Other specified diabetes mellitus without complications: Secondary | ICD-10-CM | POA: Diagnosis not present

## 2019-06-06 DIAGNOSIS — E7800 Pure hypercholesterolemia, unspecified: Secondary | ICD-10-CM | POA: Diagnosis not present

## 2019-06-06 DIAGNOSIS — C61 Malignant neoplasm of prostate: Secondary | ICD-10-CM | POA: Diagnosis not present

## 2019-06-06 DIAGNOSIS — H409 Unspecified glaucoma: Secondary | ICD-10-CM | POA: Diagnosis not present

## 2019-06-06 DIAGNOSIS — I10 Essential (primary) hypertension: Secondary | ICD-10-CM | POA: Diagnosis not present

## 2019-06-06 DIAGNOSIS — E7849 Other hyperlipidemia: Secondary | ICD-10-CM | POA: Diagnosis not present

## 2019-06-06 MED ORDER — DILTIAZEM HCL ER 60 MG PO CP12: 60.0000 mg | ORAL_CAPSULE | Freq: Two times a day (BID) | ORAL | 7 refills | Status: DC

## 2019-06-06 NOTE — Telephone Encounter (Signed)
Pt's medication was sent to pt's pharmacy as requested. Confirmation received.  °

## 2019-06-08 ENCOUNTER — Ambulatory Visit (HOSPITAL_COMMUNITY)
Admission: RE | Admit: 2019-06-08 | Discharge: 2019-06-08 | Disposition: A | Discharge status: Home / Self Care | Payer: Medicare Other | Source: Ambulatory Visit | Attending: Family | Admitting: Family

## 2019-06-08 ENCOUNTER — Ambulatory Visit (INDEPENDENT_AMBULATORY_CARE_PROVIDER_SITE_OTHER): Payer: Medicare Other | Admitting: Family

## 2019-06-08 ENCOUNTER — Other Ambulatory Visit: Payer: Self-pay

## 2019-06-08 ENCOUNTER — Ambulatory Visit: Payer: Medicare Other

## 2019-06-08 ENCOUNTER — Encounter: Payer: Self-pay | Admitting: Family

## 2019-06-08 ENCOUNTER — Other Ambulatory Visit (HOSPITAL_COMMUNITY): Payer: Medicare Other

## 2019-06-08 VITALS — BP 120/79 | Ht 69.0 in | Wt 180.0 lb

## 2019-06-08 DIAGNOSIS — I714 Abdominal aortic aneurysm, without rupture, unspecified: Secondary | ICD-10-CM

## 2019-06-08 DIAGNOSIS — Z95828 Presence of other vascular implants and grafts: Secondary | ICD-10-CM | POA: Diagnosis not present
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
## 2019-06-13 ENCOUNTER — Other Ambulatory Visit: Payer: Self-pay | Admitting: *Deleted

## 2019-06-13 DIAGNOSIS — I714 Abdominal aortic aneurysm, without rupture, unspecified: Secondary | ICD-10-CM

## 2019-06-14 ENCOUNTER — Other Ambulatory Visit: Payer: Self-pay | Admitting: Surgery

## 2019-06-14 DIAGNOSIS — C3412 Malignant neoplasm of upper lobe, left bronchus or lung: Secondary | ICD-10-CM

## 2019-06-16 ENCOUNTER — Ambulatory Visit (INDEPENDENT_AMBULATORY_CARE_PROVIDER_SITE_OTHER): Payer: Medicare Other | Admitting: Cardiology

## 2019-06-16 ENCOUNTER — Encounter: Payer: Self-pay | Admitting: Cardiology

## 2019-06-16 ENCOUNTER — Other Ambulatory Visit: Payer: Self-pay

## 2019-06-16 VITALS — BP 130/78 | HR 79 | Ht 69.0 in | Wt 181.0 lb

## 2019-06-16 DIAGNOSIS — C3412 Malignant neoplasm of upper lobe, left bronchus or lung: Secondary | ICD-10-CM | POA: Diagnosis not present

## 2019-06-16 DIAGNOSIS — Z952 Presence of prosthetic heart valve: Secondary | ICD-10-CM | POA: Diagnosis not present

## 2019-06-16 DIAGNOSIS — I4821 Permanent atrial fibrillation: Secondary | ICD-10-CM | POA: Diagnosis not present

## 2019-06-16 DIAGNOSIS — Z95828 Presence of other vascular implants and grafts: Secondary | ICD-10-CM

## 2019-06-16 DIAGNOSIS — I350 Nonrheumatic aortic (valve) stenosis: Secondary | ICD-10-CM

## 2019-06-16 NOTE — Patient Instructions (Addendum)
Medication Instructions:  You may discontinue your Aspirin. Continue all other medications as listed.  *If you need a refill on your cardiac medications before your next appointment, please call your pharmacy*  Follow-Up: At [facility], you and your health needs are our priority.  As part of our continuing mission to provide you with exceptional heart care, we have created designated Provider Care Teams.  These Care Teams include your primary Cardiologist (physician) and Advanced Practice Providers (APPs -  Physician Assistants and Nurse Practitioners) who all work together to provide you with the care you need, when you need it.  Your next appointment:   6 month(s)  The format for your next appointment:   In Person  Provider:   Truitt Merle, NP and Dr Marlou Porch in 1 year.  Thank you for choosing [facility]!!

## 2019-06-16 NOTE — Progress Notes (Signed)
Cardiology Office Note:    Date:  06/16/2019   ID:  Vincent Velez, DOB 1940-11-25, MRN [id number]  PCP:  [name], MD  Cardiologist:  Candee Furbish, MD  Electrophysiologist:  None   Referring MD: [name], MD     History of Present Illness:    Vincent Velez is a 80 y.o. male here for follow-up of TAVR and left upper lobe wedge resection, Dr. Cyndia Bent, abdominal aortic aneurysm EVAR 2010.  Last note from Dr. Cyndia Bent was 02/22/2019 where he was doing well.  He is going to repeat CT scan for him soon.  He is also had persistent atrial fibrillation, seen previously by atrial fibrillation clinic 02/21/2019.  Originally diagnosed with atrial fibrillation in 2017 underwent cardioversion but had return of atrial fibrillation.  At that time did not wish to pursue any further procedures.  Has been in permanent atrial fibrillation/flutter.  Elevated heart rates were seen during the wedge resection.  Was placed on diltiazem for further rate control.  Felt better with this.  Echocardiogram July 2020-EF 50 to 55%, TAVR valve with mild aortic regurgitation.  Previously deferred utilization of Zio patch.  Was a little dizzy after the surgery. First get up worse. Wears off during day. BP 120/80 at home.  Sometimes will be worse if he moves his eyes too quickly, question if this is more vestibular in origin.   Past Medical History:  Diagnosis Date  . AAA (abdominal aortic aneurysm) ([place])   . Anxiety   . Arthritis   . Asthma    when younger  . Atrial fibrillation ([place])   . CAD (coronary artery disease)   . Cataract    removed both  . COPD (chronic obstructive pulmonary disease) ([place])   . Diabetes mellitus    Type II  . Dysrhythmia    afib  . Esophageal reflux   . Family history of malignant neoplasm of gastrointestinal tract   . Heart murmur   . Hiatal hernia   . Hyperlipemia   . Hypertension   . Lung nodule    a. PET scan highly suspicious for malignancy. Bronch with biopsy  to be done after TAVR  . Malignant neoplasm of prostate ([place])    prostate   . Peripheral vascular disease ([place])   . Pneumonia   . Stricture and stenosis of esophagus     Past Surgical History:  Procedure Laterality Date  . ABDOMINAL AORTA STENT    . CARDIAC CATHETERIZATION    . CARDIOVERSION N/A 12/04/2015   Procedure: CARDIOVERSION;  Surgeon: Sanda Klein, MD;  Location: [facility];  Service: Cardiovascular;  Laterality: N/A;  . COLONOSCOPY    . FINGER SURGERY Right    middle finger- amputation  . INSERTION PROSTATE RADIATION SEED    . MEDIASTINOSCOPY N/A 01/06/2019   Procedure: MEDIASTINOSCOPY WITH BIOPIES;  Surgeon: Gaye Pollack, MD;  Location: [facility] OR;  Service: Thoracic;  Laterality: N/A;  . RIGHT/LEFT HEART CATH AND CORONARY ANGIOGRAPHY N/A 10/26/2018   Procedure: RIGHT/LEFT HEART CATH AND CORONARY ANGIOGRAPHY;  Surgeon: Burnell Blanks, MD;  Location: [facility];  Service: Cardiovascular;  Laterality: N/A;  . TEE WITHOUT CARDIOVERSION N/A 12/06/2018   Procedure: TRANSESOPHAGEAL ECHOCARDIOGRAM (TEE);  Surgeon: Burnell Blanks, MD;  Location: [facility];  Service: Open Heart Surgery;  Laterality: N/A;  . TRANSCATHETER AORTIC VALVE REPLACEMENT, TRANSFEMORAL N/A 12/06/2018   Procedure: TRANSCATHETER AORTIC VALVE REPLACEMENT, TRANSFEMORAL;  Surgeon: Burnell Blanks, MD;  Location: [facility];  Service: Open Heart Surgery;  Laterality: N/A;  . UPPER GASTROINTESTINAL ENDOSCOPY    . VIDEO ASSISTED THORACOSCOPY (VATS)/THOROCOTOMY Left 02/02/2019   Procedure: VIDEO ASSISTED THORACOSCOPY (VATS)/THOROCOTOMY;  Surgeon: Gaye Pollack, MD;  Location: [facility];  Service: Thoracic;  Laterality: Left;  Marland Kitchen VIDEO BRONCHOSCOPY N/A 01/06/2019   Procedure: VIDEO BRONCHOSCOPY;  Surgeon: Gaye Pollack, MD;  Location: [facility];  Service: Thoracic;  Laterality: N/A;  . WEDGE RESECTION Left 02/02/2019   Procedure: LUNG RESECTION;  Surgeon: Gaye Pollack, MD;  Location:  [facility] OR;  Service: Thoracic;  Laterality: Left;    Current Medications: Current Meds  Medication Sig  . acetaminophen (TYLENOL) 500 MG tablet Take 2 tablets (1,000 mg total) by mouth every 6 (six) hours as needed for mild pain or fever.  . diltiazem (CARDIZEM SR) 60 MG 12 hr capsule Take 1 capsule (60 mg total) by mouth every 12 (twelve) hours.  . dorzolamide-timolol (COSOPT) 22.3-6.8 MG/ML ophthalmic solution 1 drop 2 (two) times daily.  Marland Kitchen glimepiride (AMARYL) 2 MG tablet Take 2 mg by mouth daily.  . metFORMIN (GLUCOPHAGE-XR) 500 MG 24 hr tablet Take 1,000 mg by mouth every evening.   . metoprolol succinate (TOPROL-XL) 25 MG 24 hr tablet Take 50 mg by mouth daily.  . Multiple Vitamins-Minerals (MULTIVITAMIN WITH MINERALS) tablet Take 1 tablet by mouth daily.  . Multiple Vitamins-Minerals (ZINC PO) Take 1 tablet by mouth daily.  . pantoprazole (PROTONIX) 40 MG tablet Take 40 mg by mouth daily.  . simvastatin (ZOCOR) 40 MG tablet Take 40 mg by mouth at bedtime.    Alveda Reasons 20 MG TABS tablet TAKE 1 TABLET BY MOUTH  DAILY WITH SUPPER (Patient taking differently: Take 20 mg by mouth daily with supper. )  . [DISCONTINUED] aspirin 81 MG chewable tablet Chew 1 tablet (81 mg total) by mouth daily.     Allergies:   Patient has no known allergies.   Social History   Socioeconomic History  . Marital status: Married    Spouse name: Not on file  . Number of children: 2  . Years of education: Not on file  . Highest education level: Not on file  Occupational History  . Occupation: engineer-retired    Employer: [employer]  Tobacco Use  . Smoking status: Former Smoker    Types: Pipe, Cigars    Quit date: 12/2018    Years since quitting: 0.4  . Smokeless tobacco: Never Used  Substance and Sexual Activity  . Alcohol use: Not Currently    Alcohol/week: 0.0 standard drinks    Comment: occ  . Drug use: No  . Sexual activity: Not on file  Other Topics Concern  . Not on file  Social History  Narrative  . Not on file   Social Determinants of Health   Financial Resource Strain:   . Difficulty of Paying Living Expenses: Not on file  Food Insecurity:   . Worried About Charity fundraiser in the Last Year: Not on file  . Ran Out of Food in the Last Year: Not on file  Transportation Needs:   . Lack of Transportation (Medical): Not on file  . Lack of Transportation (Non-Medical): Not on file  Physical Activity:   . Days of Exercise per Week: Not on file  . Minutes of Exercise per Session: Not on file  Stress:   . Feeling of Stress : Not on file  Social Connections:   . Frequency of Communication with Friends and Family:  Not on file  . Frequency of Social Gatherings with Friends and Family: Not on file  . Attends Religious Services: Not on file  . Active Member of Clubs or Organizations: Not on file  . Attends Archivist Meetings: Not on file  . Marital Status: Not on file     Family History: The patient's family history includes Colon cancer in his father; Deep vein thrombosis in his mother; Diabetes in his mother; Heart attack in his brother; Heart disease in his mother; Hyperlipidemia in his mother; Hypertension in his brother and mother; Prostate cancer in his brother; Varicose Veins in his mother. There is no history of Colon polyps, Esophageal cancer, Rectal cancer, or Stomach cancer.  ROS:   Please see the history of present illness.    No fevers chills nausea vomiting syncope bleeding all other systems reviewed and are negative.  EKGs/Labs/Other Studies Reviewed:    The following studies were reviewed today: Prior echocardiogram, CT report  EKG:  EKG is not ordered today.    Recent Labs: 12/01/2018: B Natriuretic Peptide 72.4 01/30/2019: ALT 16 02/04/2019: BUN 8; Creatinine, Ser 0.67; Hemoglobin 11.4; Magnesium 2.0; Platelets 155; Potassium 3.8; Sodium 138  Recent Lipid Panel    Component Value Date/Time   CHOL 120 09/15/2013 1428   TRIG 110.0  09/15/2013 1428   HDL 37.70 (L) 09/15/2013 1428   CHOLHDL 3 09/15/2013 1428   VLDL 22.0 09/15/2013 1428   LDLCALC 60 09/15/2013 1428    Physical Exam:    VS:  BP 130/78   Pulse 79   Ht 5\' 9"  (1.753 m)   Wt 181 lb (82.1 kg)   SpO2 99%   BMI 26.73 kg/m     Wt Readings from Last 3 Encounters:  06/16/19 181 lb (82.1 kg)  06/08/19 180 lb (81.6 kg)  02/22/19 176 lb (79.8 kg)     GEN:  Well nourished, well developed in no acute distress HEENT: Normal NECK: No JVD; No carotid bruits LYMPHATICS: No lymphadenopathy CARDIAC: Occasional ectopy but fairly regular, 1/6 systolic murmur, no rubs, gallops RESPIRATORY:  Clear to auscultation without rales, wheezing or rhonchi  ABDOMEN: Soft, non-tender, non-distended MUSCULOSKELETAL:  No edema; No deformity  SKIN: Warm and dry NEUROLOGIC:  Alert and oriented x 3 PSYCHIATRIC:  Normal affect   ASSESSMENT:    1. Permanent atrial fibrillation ([place])   2. S/P TAVR (transcatheter aortic valve replacement)   3. Severe aortic stenosis   4. History of repair of aneurysm of abdominal aorta using endovascular stent graft   5. Primary adenocarcinoma of upper lobe of left lung ([facility])    PLAN:    In order of problems listed above:  TAVR valve prior severe aortic stenosis -Doing well, mild aortic regurgitation.  Prior echocardiogram reviewed.  EF 50 to 55%.  Permanent atrial fibrillation -On metoprolol as well as diltiazem as above.  Continue.  Overall reasonable rate control.  Chronic anticoagulation -On Xarelto, no bleeding.  Outside lab hemoglobin 11.4, creatinine 0.79  Diabetes with essential hypertension -Stable medications reviewed followed by Dr. Delfina Redwood, outside lab hemoglobin A1c 7.1  Coronary artery disease -No anginal symptoms, risk factor modification.  Left upper lobe lung resection, adenocarcinoma -T2 a, N0 with negative surgical margins.  02/02/2019.  Excellent.  Hyperlipidemia -On simvastatin 40.  LDL 55 on 06/06/2018 via  outside labs.  No myalgias   Medication Adjustments/Labs and Tests Ordered: Current medicines are reviewed at length with the patient today.  Concerns regarding medicines are outlined above.  No orders of the defined types were placed in this encounter.  No orders of the defined types were placed in this encounter.   Patient Instructions  Medication Instructions:  You may discontinue your Aspirin. Continue all other medications as listed.  *If you need a refill on your cardiac medications before your next appointment, please call your pharmacy*  Follow-Up: At [facility], you and your health needs are our priority.  As part of our continuing mission to provide you with exceptional heart care, we have created designated Provider Care Teams.  These Care Teams include your primary Cardiologist (physician) and Advanced Practice Providers (APPs -  Physician Assistants and Nurse Practitioners) who all work together to provide you with the care you need, when you need it.  Your next appointment:   6 month(s)  The format for your next appointment:   In Person  Provider:   Truitt Merle, NP and Dr Marlou Porch in 1 year.  Thank you for choosing [facility]!!        Signed, Candee Furbish, MD  06/16/2019 3:17 PM    [place]

## 2019-06-27 DIAGNOSIS — C61 Malignant neoplasm of prostate: Secondary | ICD-10-CM | POA: Diagnosis not present

## 2019-06-27 DIAGNOSIS — Z952 Presence of prosthetic heart valve: Secondary | ICD-10-CM | POA: Diagnosis not present

## 2019-06-27 DIAGNOSIS — I10 Essential (primary) hypertension: Secondary | ICD-10-CM | POA: Diagnosis not present

## 2019-06-27 DIAGNOSIS — F419 Anxiety disorder, unspecified: Secondary | ICD-10-CM | POA: Diagnosis not present

## 2019-06-27 DIAGNOSIS — Z1389 Encounter for screening for other disorder: Secondary | ICD-10-CM | POA: Diagnosis not present

## 2019-06-27 DIAGNOSIS — E1169 Type 2 diabetes mellitus with other specified complication: Secondary | ICD-10-CM | POA: Diagnosis not present

## 2019-06-27 DIAGNOSIS — Z0000 Encounter for general adult medical examination without abnormal findings: Secondary | ICD-10-CM | POA: Diagnosis not present

## 2019-06-27 DIAGNOSIS — E7800 Pure hypercholesterolemia, unspecified: Secondary | ICD-10-CM | POA: Diagnosis not present

## 2019-06-27 DIAGNOSIS — Z23 Encounter for immunization: Secondary | ICD-10-CM | POA: Diagnosis not present

## 2019-06-27 DIAGNOSIS — I714 Abdominal aortic aneurysm, without rupture: Secondary | ICD-10-CM | POA: Diagnosis not present

## 2019-06-27 DIAGNOSIS — H409 Unspecified glaucoma: Secondary | ICD-10-CM | POA: Diagnosis not present

## 2019-06-27 DIAGNOSIS — I480 Paroxysmal atrial fibrillation: Secondary | ICD-10-CM | POA: Diagnosis not present

## 2019-07-28 DIAGNOSIS — I480 Paroxysmal atrial fibrillation: Secondary | ICD-10-CM | POA: Diagnosis not present

## 2019-07-28 DIAGNOSIS — E139 Other specified diabetes mellitus without complications: Secondary | ICD-10-CM | POA: Diagnosis not present

## 2019-07-28 DIAGNOSIS — C61 Malignant neoplasm of prostate: Secondary | ICD-10-CM | POA: Diagnosis not present

## 2019-07-28 DIAGNOSIS — E7800 Pure hypercholesterolemia, unspecified: Secondary | ICD-10-CM | POA: Diagnosis not present

## 2019-07-28 DIAGNOSIS — I10 Essential (primary) hypertension: Secondary | ICD-10-CM | POA: Diagnosis not present

## 2019-07-28 DIAGNOSIS — E7849 Other hyperlipidemia: Secondary | ICD-10-CM | POA: Diagnosis not present

## 2019-07-28 DIAGNOSIS — H409 Unspecified glaucoma: Secondary | ICD-10-CM | POA: Diagnosis not present

## 2019-07-28 DIAGNOSIS — Z7984 Long term (current) use of oral hypoglycemic drugs: Secondary | ICD-10-CM | POA: Diagnosis not present

## 2019-08-03 ENCOUNTER — Ambulatory Visit: Payer: Medicare Other | Attending: Internal Medicine

## 2019-08-03 ENCOUNTER — Other Ambulatory Visit: Payer: Self-pay

## 2019-08-03 DIAGNOSIS — Z23 Encounter for immunization: Secondary | ICD-10-CM

## 2019-08-03 NOTE — Progress Notes (Signed)
   Covid-19 Vaccination Clinic  Name:  HERSHAL ERIKSSON    MRN: [id number] DOB: [birth year]  08/03/2019  Mr. Riel was observed post Covid-19 immunization for 15 minutes without incident. He was provided with Vaccine Information Sheet and instruction to access the V-Safe system.   Mr. Witz was instructed to call 911 with any severe reactions post vaccine: Marland Kitchen Difficulty breathing  . Swelling of face and throat  . A fast heartbeat  . A bad rash all over body  . Dizziness and weakness   Immunizations Administered    Name Date Dose VIS Date Route   Pfizer COVID-19 Vaccine 08/03/2019 11:14 AM 0.3 mL 05/12/2019 Intramuscular   Manufacturer: [place]   Lot: [id number]   [place]: [id number]

## 2019-08-23 ENCOUNTER — Ambulatory Visit
Admission: RE | Admit: 2019-08-23 | Discharge: 2019-08-23 | Disposition: A | Discharge status: Home / Self Care | Payer: Medicare Other | Source: Ambulatory Visit | Attending: Surgery | Admitting: Surgery

## 2019-08-23 ENCOUNTER — Encounter: Payer: Self-pay | Admitting: Surgery

## 2019-08-23 ENCOUNTER — Ambulatory Visit (INDEPENDENT_AMBULATORY_CARE_PROVIDER_SITE_OTHER): Payer: Medicare Other | Admitting: Surgery

## 2019-08-23 ENCOUNTER — Other Ambulatory Visit: Payer: Self-pay

## 2019-08-23 VITALS — BP 125/76 | HR 82 | Temp 97.6°F | Resp 20 | Ht 69.0 in | Wt 181.0 lb

## 2019-08-23 DIAGNOSIS — Z85118 Personal history of other malignant neoplasm of bronchus and lung: Secondary | ICD-10-CM

## 2019-08-23 DIAGNOSIS — R911 Solitary pulmonary nodule: Secondary | ICD-10-CM | POA: Diagnosis not present

## 2019-08-23 DIAGNOSIS — C3412 Malignant neoplasm of upper lobe, left bronchus or lung: Secondary | ICD-10-CM

## 2019-08-23 IMAGING — CT CT CHEST W/O CM
2 of 4 series · 14 of 36 positions shown, 17 images · non-contrast
Comparison: [DATE]

CLINICAL DATA: Lung nodule follow-up

EXAM:
CT CHEST WITHOUT CONTRAST
TECHNIQUE: Multidetector CT imaging of the chest was performed following the
standard protocol without IV contrast.

[Series 2: chest 2.00 br40 s3 · axial · 0.65mm/px · z∈[+1551,+1813]mm · 11 of 155 slices shown, 14 images (1 of 2)]
[im 12/155  mediastinal]
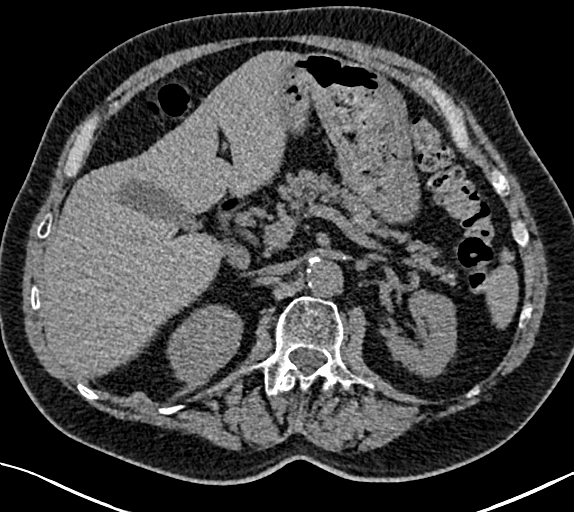
[im 12/155  lung]
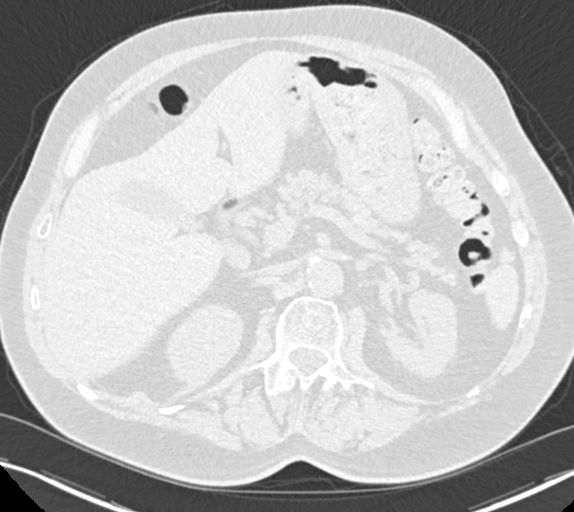
[im 24/155  lung]
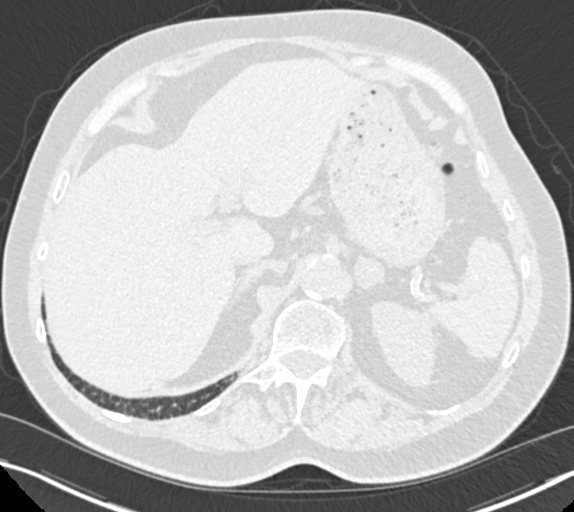
[im 36/155  lung]
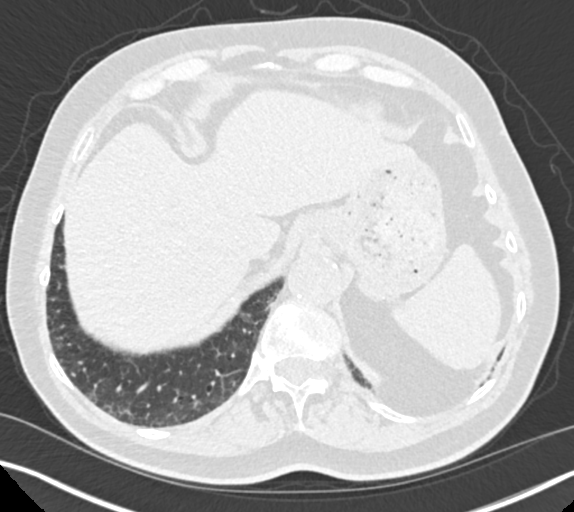
[im 48/155  lung]
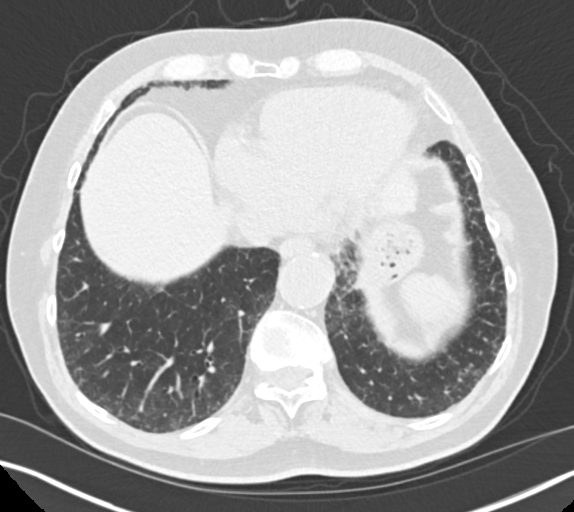
[im 60/155  mediastinal]
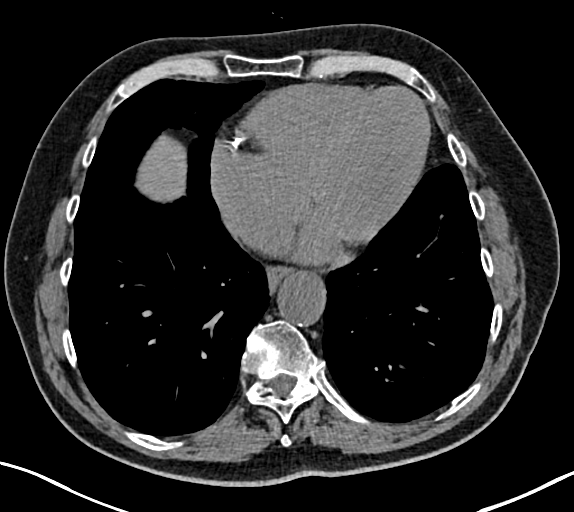
[im 60/155  lung]
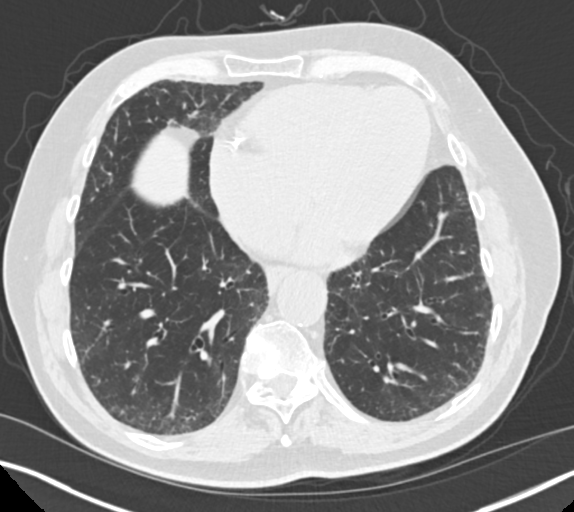
[im 83/155  lung]
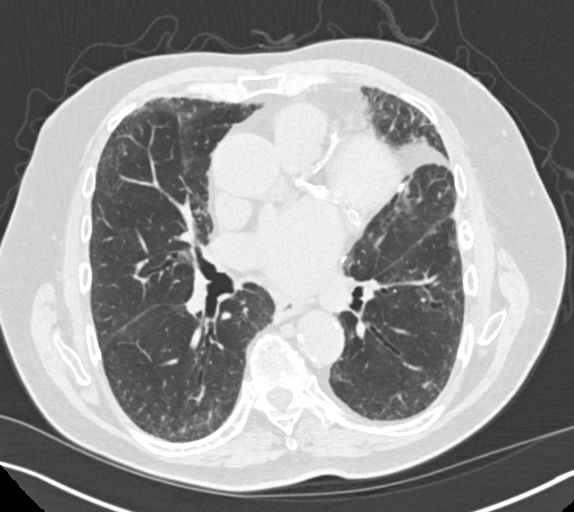
[im 95/155  lung]
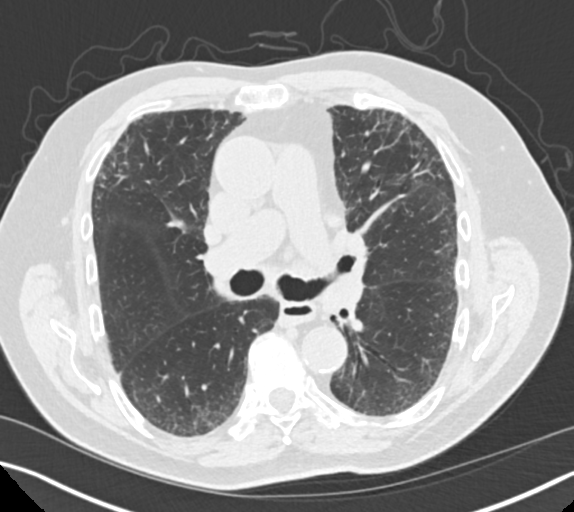
[im 107/155  lung]
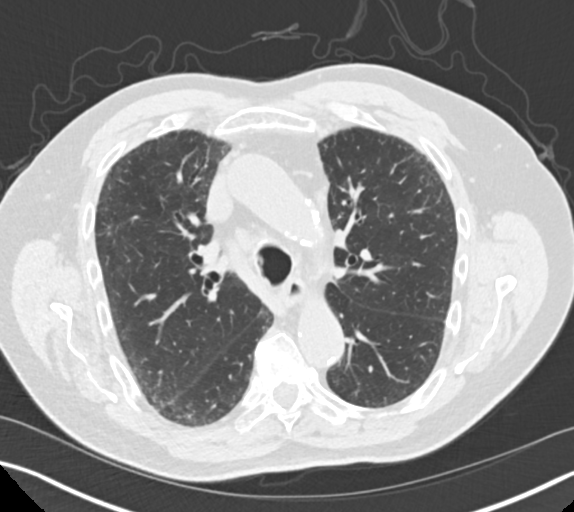
[im 119/155  mediastinal]
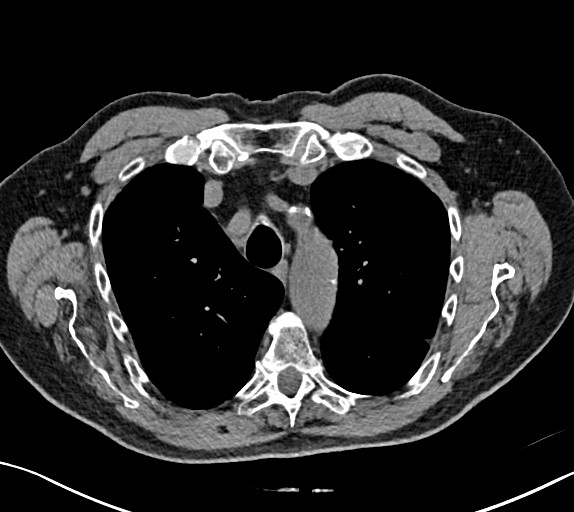
[im 119/155  lung]
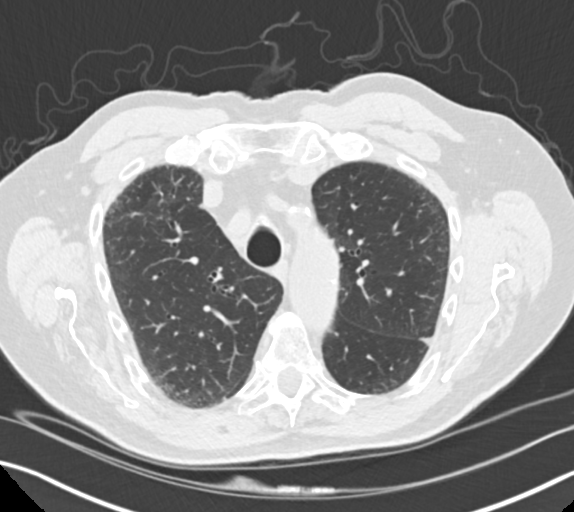
[im 131/155  lung]
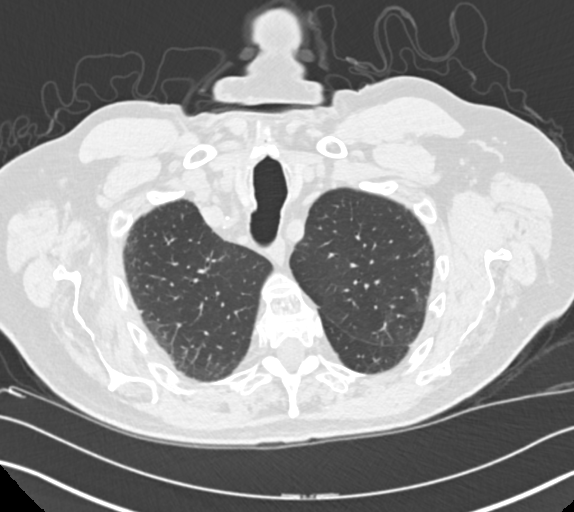
[im 143/155  lung]
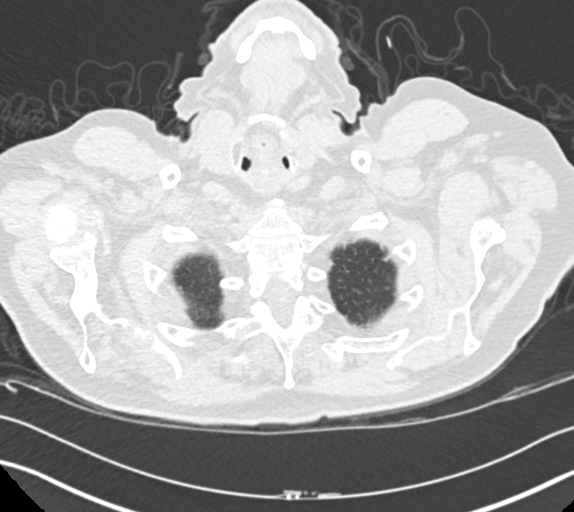

[Series 4: chest 2.00 br40 s3 · coronal · 0.61mm/px · 3 of 165 slices shown (2 of 2)]
[im 33/165  lung]
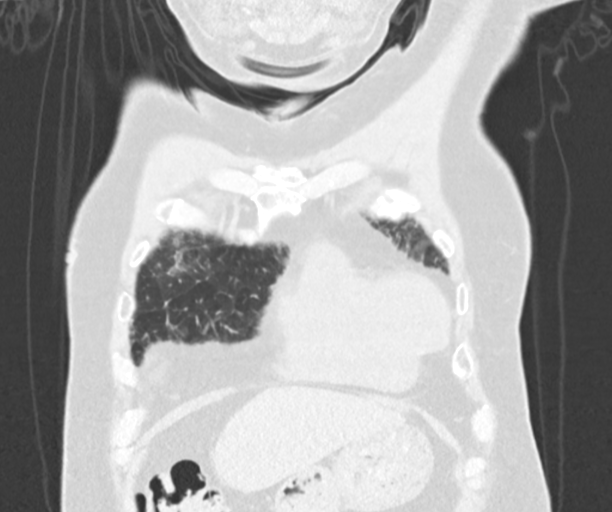
[im 66/165  lung]
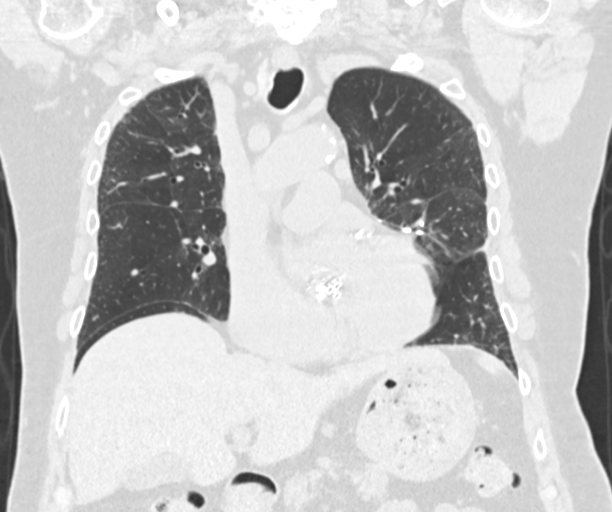
[im 99/165  lung]
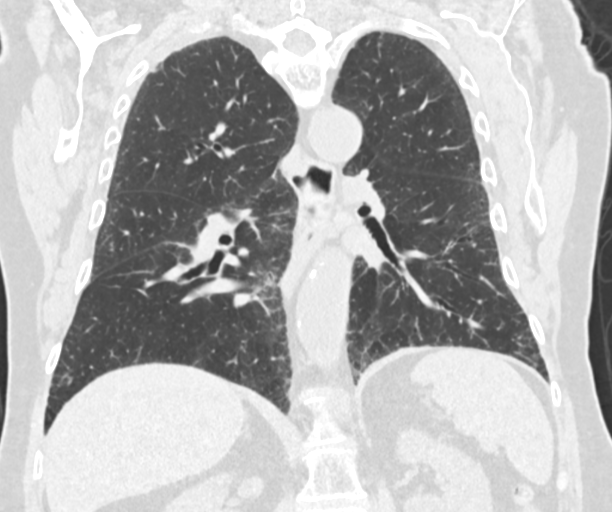

[14 of 36 positions shown; findings below may reference images not displayed]

FINDINGS: Cardiovascular: Normal heart size. Endovascular aortic valve
replacement. Coronary artery calcifications. Thoracic aortic
calcifications.

Mediastinum/Nodes: There are no enlarged mediastinal or axillary
lymph nodes. No bulky hilar adenopathy on this noncontrast study.
Thyroid is unremarkable. Esophagus is unremarkable.

Lungs/Pleura: Left upper lobe nodule has resolved. No new
consolidation or mass. Unchanged mild interstitial thickening and
mild cylindrical bronchiectasis. No pleural effusion

Upper Abdomen: Stable left adrenal adenoma.  No acute findings.

Musculoskeletal: Saturation of thoracic kyphosis. Decreased osseous
mineralization. Chronic upper and mid thoracic vertebral body loss
of height.
IMPRESSION: Left upper lobe nodule has resolved.  No new or acute findings.

Aortic Atherosclerosis ([ZN]-[ZN]).

## 2019-08-23 NOTE — Progress Notes (Signed)
HPI:  Patient returns for routine postoperative surveillance having undergone left muscle-sparing thoracotomy with wedge resection of a left upper lobe lung mass on 02/02/2019.  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  He has felt well and continues to abstain from smoking.  He has had no chest pain or shortness of breath.  Current Outpatient Medications  Medication Sig Dispense Refill  . acetaminophen (TYLENOL) 500 MG tablet Take 2 tablets (1,000 mg total) by mouth every 6 (six) hours as needed for mild pain or fever. 30 tablet 0  . diltiazem (CARDIZEM SR) 60 MG 12 hr capsule Take 1 capsule (60 mg total) by mouth every 12 (twelve) hours. 60 capsule 7  . dorzolamide-timolol (COSOPT) 22.3-6.8 MG/ML ophthalmic solution 1 drop 2 (two) times daily.    Marland Kitchen glimepiride (AMARYL) 2 MG tablet Take 2 mg by mouth daily.    . metFORMIN (GLUCOPHAGE-XR) 500 MG 24 hr tablet Take 1,000 mg by mouth every evening.     . metoprolol succinate (TOPROL-XL) 25 MG 24 hr tablet Take 50 mg by mouth daily.    . Multiple Vitamins-Minerals (MULTIVITAMIN WITH MINERALS) tablet Take 1 tablet by mouth daily.    . Multiple Vitamins-Minerals (ZINC PO) Take 1 tablet by mouth daily.    . pantoprazole (PROTONIX) 40 MG tablet Take 40 mg by mouth daily.    . simvastatin (ZOCOR) 40 MG tablet Take 40 mg by mouth at bedtime.      Alveda Reasons 20 MG TABS tablet TAKE 1 TABLET BY MOUTH  DAILY WITH SUPPER (Patient taking differently: Take 20 mg by mouth daily with supper. ) 90 tablet 1   No current facility-administered medications for this visit.     Physical Exam: BP 125/76   Pulse 82   Temp 97.6 F (36.4 C) (Skin)   Resp 20   Ht 5\' 9"  (1.753 m)   Wt 181 lb (82.1 kg)   SpO2 97% Comment: RA  BMI 26.73 kg/m  He looks well. There is no cervical or supraclavicular adenopathy. Lungs are clear. Cardiac exam  shows a regular rate and rhythm with normal heart sounds.  There is no murmur.   Diagnostic Tests:  CLINICAL DATA:  Lung nodule follow-up  EXAM: CT CHEST WITHOUT CONTRAST  TECHNIQUE: Multidetector CT imaging of the chest was performed following the standard protocol without IV contrast.  COMPARISON:  10/31/2018  FINDINGS: Cardiovascular: Normal heart size. Endovascular aortic valve replacement. Coronary artery calcifications. Thoracic aortic calcifications.  Mediastinum/Nodes: There are no enlarged mediastinal or axillary lymph nodes. No bulky hilar adenopathy on this noncontrast study. Thyroid is unremarkable. Esophagus is unremarkable.  Lungs/Pleura: Left upper lobe nodule has resolved. No new consolidation or mass. Unchanged mild interstitial thickening and mild cylindrical bronchiectasis. No pleural effusion  Upper Abdomen: Stable left adrenal adenoma.  No acute findings.  Musculoskeletal: Saturation of thoracic kyphosis. Decreased osseous mineralization. Chronic upper and mid thoracic vertebral body loss of height.  IMPRESSION: Left upper lobe nodule has resolved.  No new or acute findings.  Aortic Atherosclerosis (ICD10-I70.0).   Electronically Signed   By: Macy Mis M.D.   On: 08/23/2019 14:39   Impression:  He is doing well 6 months following lung cancer resection.  CT scan of the chest shows no evidence of recurrent or metastatic lung cancer.  I reviewed the CT scan images with him and answered all of his questions.  I encouraged him to continue abstaining from smoking.  Plan:  He will return to see me in 6 months with a CT scan of the chest for lung cancer surveillance.  He has a follow-up appointment with cardiology in July for his 1 year follow-up after TAVR.  I spent 20 minutes performing this established patient evaluation and > 50% of this time was spent face to face counseling and coordinating the surveillance of this patient's  resected lung cancer.    Gaye Pollack, MD [facility] [phone]

## 2019-08-24 ENCOUNTER — Ambulatory Visit: Payer: Medicare Other | Attending: Internal Medicine

## 2019-08-24 DIAGNOSIS — Z23 Encounter for immunization: Secondary | ICD-10-CM

## 2019-08-24 NOTE — Progress Notes (Signed)
   Covid-19 Vaccination Clinic  Name:  Vincent Velez    MRN: [id number] DOB: 04/05/1941  08/24/2019  [name] was observed post Covid-19 immunization for 15 minutes without incident. He was provided with Vaccine Information Sheet and instruction to access the V-Safe system.   [name] was instructed to call 911 with any severe reactions post vaccine: Marland Kitchen Difficulty breathing  . Swelling of face and throat  . A fast heartbeat  . A bad rash all over body  . Dizziness and weakness   Immunizations Administered    Name Date Dose VIS Date Route   Pfizer COVID-19 Vaccine 08/24/2019 11:22 AM 0.3 mL 05/12/2019 Intramuscular   Manufacturer: [place]   Lot: [id number]   [place]: [id number]

## 2019-09-25 DIAGNOSIS — L723 Sebaceous cyst: Secondary | ICD-10-CM | POA: Diagnosis not present

## 2019-09-29 DIAGNOSIS — E7800 Pure hypercholesterolemia, unspecified: Secondary | ICD-10-CM | POA: Diagnosis not present

## 2019-09-29 DIAGNOSIS — H409 Unspecified glaucoma: Secondary | ICD-10-CM | POA: Diagnosis not present

## 2019-09-29 DIAGNOSIS — C61 Malignant neoplasm of prostate: Secondary | ICD-10-CM | POA: Diagnosis not present

## 2019-09-29 DIAGNOSIS — E119 Type 2 diabetes mellitus without complications: Secondary | ICD-10-CM | POA: Diagnosis not present

## 2019-09-29 DIAGNOSIS — I480 Paroxysmal atrial fibrillation: Secondary | ICD-10-CM | POA: Diagnosis not present

## 2019-09-29 DIAGNOSIS — E1169 Type 2 diabetes mellitus with other specified complication: Secondary | ICD-10-CM | POA: Diagnosis not present

## 2019-09-29 DIAGNOSIS — I10 Essential (primary) hypertension: Secondary | ICD-10-CM | POA: Diagnosis not present

## 2019-09-29 DIAGNOSIS — E139 Other specified diabetes mellitus without complications: Secondary | ICD-10-CM | POA: Diagnosis not present

## 2019-10-27 DIAGNOSIS — H401132 Primary open-angle glaucoma, bilateral, moderate stage: Secondary | ICD-10-CM | POA: Diagnosis not present

## 2019-12-05 NOTE — Progress Notes (Addendum)
[facility] Vincent Velez                                       Cardiology Office Note    Date:  12/07/2019   ID:  Vincent Velez, DOB Jul 03, 1940, MRN [id number]  PCP:  [name], MD  Cardiologist: Candee Furbish, MD/ Dr. Jacob Moores. [name])  CC: 1 year s/p TAVR  History of Present Illness:  Velez SELSOR is a 79 y.o. 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    He had a history ofmoderate aortic stenosis diagnosed by echocardiogram in May 2017with amean gradient was 25 mmHg. The patientdevelopedprogressive exertional fatigue and shortness of breath and a follow-up echocardiogram on 08/04/2018 showed progression to severe aortic stenosis with a mean gradient of 41 mmHg, a peak gradient of 61 mmHg, and a dimensionless index of 0.19. Left ventricular ejection fractionwasmildly reduced at 45 to 50% with mild left ventricular dilatation.Cardiac catheterization on 10/26/2018 showed mild nonobstructive coronary disease. Unfortunately, pre TAVR chest CT showeda 2.4 x 2.3 cm macrolobulated and slightly spiculated lung mass in the anterior aspect of the lingula suspicious for lung cancer. SubsequentPET scan showedthe left upper lobe nodule to be hypermetabolic consistent with bronchogenic carcinoma. Therewereborderline enlarged lymph nodes in the anterior mediastinumthat werenot diagnostic of metastatic carcinoma. Plan was made for TAVR followed bybronchoscopy and mediastinoscopy.    He underwentsuccessful TAVR with a55mm Edwards Sapien 3 THV via the TF approach on 7/72020. Post operative echoshowed EF 50-55%, normally  functioning TAVRwith mean gradient 19 mmHg and mild PVL. He was discharged on home Xarelto and aspirin.  1 month echo showed. EF 50-55%, normally functioning TAVR with a mean gradient of 19 mm hg and mild PVL. He later underwentleft muscle-sparing thoracotomy with wedge resection of a left upper lobe lung masson 02/02/2019 by Dr. Cyndia Bent. 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  Today he presents to clinic for follow up. He has not been doing as well recently. He has had dizziness and ear pressure as well as decreased physical strength. He has also had some blurred vision. Had one dizzy episode when he fell to the bed but did not pass out. It was associated with nausea and diaphoresis. The dizziness comes on with certain head and eye movements. No CP or SOB. No LE edema, orthopnea or PND. No dizziness or syncope. No palpitations. He has continued to abstain from smoking.     Past Medical History:  Diagnosis Date  . AAA (abdominal aortic aneurysm) ([place])   . Anxiety   . Arthritis   . Asthma    when younger  . Atrial fibrillation ([place])   . CAD (coronary artery disease)   . Cataract    removed both  . COPD (chronic obstructive pulmonary disease) ([place])   . Diabetes mellitus    Type II  . Dysrhythmia    afib  . Esophageal reflux   . Family history of malignant neoplasm of gastrointestinal tract   . Heart murmur   . Hiatal hernia   . Hyperlipemia   . Hypertension   . Lung nodule  a. PET scan highly suspicious for malignancy. Bronch with biopsy to be done after TAVR  . Malignant neoplasm of prostate ([place])    prostate   . Peripheral vascular disease ([place])   . Pneumonia   . Stricture and stenosis of esophagus     Past Surgical History:  Procedure Laterality Date  . ABDOMINAL AORTA STENT    . CARDIAC CATHETERIZATION    . CARDIOVERSION N/A 12/04/2015   Procedure: CARDIOVERSION;  Surgeon: Sanda Klein, MD;  Location: [facility];  Service:  Cardiovascular;  Laterality: N/A;  . COLONOSCOPY    . FINGER SURGERY Right    middle finger- amputation  . INSERTION PROSTATE RADIATION SEED    . MEDIASTINOSCOPY N/A 01/06/2019   Procedure: MEDIASTINOSCOPY WITH BIOPIES;  Surgeon: Gaye Pollack, MD;  Location: [facility] OR;  Service: Thoracic;  Laterality: N/A;  . RIGHT/LEFT HEART CATH AND CORONARY ANGIOGRAPHY N/A 10/26/2018   Procedure: RIGHT/LEFT HEART CATH AND CORONARY ANGIOGRAPHY;  Surgeon: Burnell Blanks, MD;  Location: [place] [facility];  Service: Cardiovascular;  Laterality: N/A;  . TEE WITHOUT CARDIOVERSION N/A 12/06/2018   Procedure: TRANSESOPHAGEAL ECHOCARDIOGRAM (TEE);  Surgeon: Burnell Blanks, MD;  Location: [place] [facility];  Service: Open Heart Surgery;  Laterality: N/A;  . TRANSCATHETER AORTIC VALVE REPLACEMENT, TRANSFEMORAL N/A 12/06/2018   Procedure: TRANSCATHETER AORTIC VALVE REPLACEMENT, TRANSFEMORAL;  Surgeon: Burnell Blanks, MD;  Location: [place] [facility];  Service: Open Heart Surgery;  Laterality: N/A;  . UPPER GASTROINTESTINAL ENDOSCOPY    . VIDEO ASSISTED THORACOSCOPY (VATS)/THOROCOTOMY Left 02/02/2019   Procedure: VIDEO ASSISTED THORACOSCOPY (VATS)/THOROCOTOMY;  Surgeon: Gaye Pollack, MD;  Location: [place];  Service: Thoracic;  Laterality: Left;  Marland Kitchen VIDEO BRONCHOSCOPY N/A 01/06/2019   Procedure: VIDEO BRONCHOSCOPY;  Surgeon: Gaye Pollack, MD;  Location[facility];  Service: Thoracic;  Laterality: N/A;  . WEDGE RESECTION Left 02/02/2019   Procedure: LUNG RESECTION;  Surgeon: Gaye Pollack, MD;  Location: [facility] OR;  Service: Thoracic;  Laterality: Left;    Current Medications: Outpatient Medications Prior to Visit  Medication Sig Dispense Refill  . acetaminophen (TYLENOL) 500 MG tablet Take 2 tablets (1,000 mg total) by mouth every 6 (six) hours as needed for mild pain or fever. 30 tablet 0  . diltiazem (CARDIZEM SR) 60 MG 12 hr capsule Take 1 capsule (60 mg total) by mouth every 12 (twelve) hours. 60  capsule 7  . dorzolamide-timolol (COSOPT) 22.3-6.8 MG/ML ophthalmic solution 1 drop 2 (two) times daily.    Marland Kitchen glimepiride (AMARYL) 2 MG tablet Take 2 mg by mouth daily.    . metFORMIN (GLUCOPHAGE-XR) 500 MG 24 hr tablet Take 1,000 mg by mouth every evening.     . metoprolol succinate (TOPROL-XL) 25 MG 24 hr tablet Take 50 mg by mouth daily.    . Multiple Vitamins-Minerals (MULTIVITAMIN WITH MINERALS) tablet Take 1 tablet by mouth daily.    . Multiple Vitamins-Minerals (ZINC PO) Take 1 tablet by mouth daily.    . pantoprazole (PROTONIX) 40 MG tablet Take 40 mg by mouth daily.    . simvastatin (ZOCOR) 40 MG tablet Take 40 mg by mouth at bedtime.      Alveda Reasons 20 MG TABS tablet TAKE 1 TABLET BY MOUTH  DAILY WITH SUPPER 90 tablet 1   No facility-administered medications prior to visit.     Allergies:   Patient has no known allergies.   Social History   Socioeconomic History  . Marital status: Married  Spouse name: Not on file  . Number of children: 2  . Years of education: Not on file  . Highest education level: Not on file  Occupational History  . Occupation: engineer-retired    Employer: [employer]  Tobacco Use  . Smoking status: Former Smoker    Types: Pipe, Cigars    Quit date: 12/2018    Years since quitting: 0.9  . Smokeless tobacco: Never Used  Vaping Use  . Vaping Use: Never used  Substance and Sexual Activity  . Alcohol use: Not Currently    Alcohol/week: 0.0 standard drinks    Comment: occ  . Drug use: No  . Sexual activity: Not on file  Other Topics Concern  . Not on file  Social History Narrative  . Not on file   Social Determinants of Health   Financial Resource Strain:   . Difficulty of Paying Living Expenses:   Food Insecurity:   . Worried About Charity fundraiser in the Last Year:   . Arboriculturist in the Last Year:   Transportation Needs:   . Film/video editor (Medical):   Marland Kitchen Lack of Transportation (Non-Medical):   Physical Activity:   .  Days of Exercise per Week:   . Minutes of Exercise per Session:   Stress:   . Feeling of Stress :   Social Connections:   . Frequency of Communication with Friends and Family:   . Frequency of Social Gatherings with Friends and Family:   . Attends Religious Services:   . Active Member of Clubs or Organizations:   . Attends Archivist Meetings:   Marland Kitchen Marital Status:      Family History:  The patient's family history includes Colon cancer in his father; Deep vein thrombosis in his mother; Diabetes in his mother; Heart attack in his brother; Heart disease in his mother; Hyperlipidemia in his mother; Hypertension in his brother and mother; Prostate cancer in his brother; Varicose Veins in his mother.     ROS:   Please see the history of present illness.    ROS All other systems reviewed and are negative.   PHYSICAL EXAM:   VS:  BP 122/68   Pulse (!) 59   Ht 5\' 9"  (1.753 m)   Wt 177 lb 9.6 oz (80.6 kg)   SpO2 96%   BMI 26.23 kg/m    GEN: Well nourished, well developed, in no acute distress HEENT: normal Neck: no JVD or masses Cardiac: irreg irreg; 2/6 diastolic murmur. No rubs, or gallops,no edema  Respiratory:  clear to auscultation bilaterally, normal work of breathing GI: soft, nontender, nondistended, + BS MS: no deformity or atrophy Skin: warm and dry, no rash Neuro:  Alert and Oriented x 3, Strength and sensation are intact Psych: euthymic mood, full affect   Wt Readings from Last 3 Encounters:  12/06/19 177 lb 9.6 oz (80.6 kg)  08/23/19 181 lb (82.1 kg)  06/16/19 181 lb (82.1 kg)      Studies/Labs Reviewed:   EKG:  EKG is NOT ordered today.   Recent Labs: 01/30/2019: ALT 16 02/04/2019: BUN 8; Creatinine, Ser 0.67; Hemoglobin 11.4; Magnesium 2.0; Platelets 155; Potassium 3.8; Sodium 138   Lipid Panel    Component Value Date/Time   CHOL 120 09/15/2013 1428   TRIG 110.0 09/15/2013 1428   HDL 37.70 (L) 09/15/2013 1428   CHOLHDL 3 09/15/2013 1428   VLDL  22.0 09/15/2013 1428   LDLCALC 60 09/15/2013 1428  Additional studies/ records that were reviewed today include:  TAVR OPERATIVE NOTE   Date of Procedure:12/06/2018  Preoperative Diagnosis:Severe Aortic Stenosis   Postoperative Diagnosis:Same   Procedure:   Transcatheter Aortic Valve Replacement - PercutaneousRightTransfemoral Approach Edwards Sapien 3 THV (size 82mm, model # 9600TFX, serial # [id number])  Co-Surgeons:Bryan Alveria Apley, MD and Lauree Chandler, MD   Anesthesiologist:R. Ola Spurr, MD  Dala Dock, MD  Pre-operative Echo Findings: ? Severe aortic stenosis ? Mildleft ventricular systolic dysfunction  Post-operative Echo Findings: ? mildparavalvular leak ? Mild unchangedleft ventricular systolic dysfunction  _____________  Echo 12/07/18: IMPRESSIONS 1. The left ventricle has low normal systolic function, with an ejection fraction of 50-55%. The cavity size was normal. Left ventricular diastolic function could not be evaluated secondary to atrial fibrillation. No evidence of left ventricular  regional wall motion abnormalities. 2. The right ventricle has normal systolic function. The cavity was normal. There is no increase in right ventricular wall thickness. Right ventricular systolic pressure could not be assessed. 3. Left atrial size was severely dilated. 4. There is moderate dilatation of the ascending aorta measuring 44 mm. 5. The inferior vena cava was normal in size with <50% respiratory variability. 6. - TAVR: S/P Edwards Sapien 3 THV 53mm that appears to be functioning normally. There is mild perivalvular AI. The mean AVG is 71mmHg. AV Vmax: 264.60 cm/s. LVOT/AV VTI ratio: 0.50. AV Area (Vmax): 2.44 cm. 7. Compared to prior echo, a TAVR is now present.  _________________   Echo  12/06/2019 IMPRESSIONS 1. Left ventricular ejection fraction, by estimation, is 50 to 55%. The  left ventricle has low normal function. The left ventricle has no regional  wall motion abnormalities. N/A due to atrial flutter.  2. Right ventricular systolic function is normal. The right ventricular  size is normal. There is normal pulmonary artery systolic pressure.  3. Left atrial size was severely dilated.  4. Right atrial size was moderately dilated.  5. The mitral valve is normal in structure. Trivial mitral valve  regurgitation. No evidence of mitral stenosis.  6. The aortic valve has been repaired/replaced. Aortic valve  regurgitation is mild to moderate. There is a 26 mm Edwards Sapien  prosthetic (TAVR) valve present in the aortic position. Procedure Date:  12/06/18. Aortic regurgitation PHT measures 357 msec.  Aortic valve mean gradient measures 17.0 mmHg.  7. Aortic dilatation noted. There is moderate dilatation of the ascending  aorta measuring 38 mm.  8. The inferior vena cava is normal in size with greater than 50%  respiratory variability, suggesting right atrial pressure of 3 mmHg.   Conclusion(s)/Recommendation(s): TAVR now with mild-moderate perivalvular leak (was previously mild). AV mean gradient similar to prior. Patient appears to be in atrial flutter throughout the study.   ASSESSMENT & PLAN:   Severe ASs/pTAVR: Echo today shows EF 50%, normally functioning TAVR with a mean gradient of 17 mmHg and mild-moderate PVL (previously mild) which I personally reviewed. Additionally, a diastolic murmur is heard on exam. Continue on Xarelto for afib. SBE prophylaxis discussed; I have RX'd amoxicillin. He has NYHA class III symptoms with worsening fatigue and decreased exercise tolerance recently, will get basic labs including CBC, TSH and BMET. Plan for repeat echo in 6 months.    Medication Adjustments/Labs and Tests Ordered: Current medicines are reviewed at length  with the patient today.  Concerns regarding medicines are outlined above.  Medication changes, Labs and Tests ordered today are listed in the Patient Instructions below. Patient Instructions  Medication Instructions:  Your  physician recommends that you continue on your current medications as directed. Please refer to the Current Medication list given to you today.  *If you need a refill on your cardiac medications before your next appointment, please call your pharmacy*   Lab Work: BMET, TSH and CBC today  If you have labs (blood work) drawn today and your tests are completely normal, you will receive your results only by: Marland Kitchen MyChart Message (if you have MyChart) OR . A paper copy in the mail If you have any lab test that is abnormal or we need to change your treatment, we will call you to review the results.   Testing/Procedures: None   Follow-Up: At [facility], you and your health needs are our priority.  As part of our continuing mission to provide you with exceptional heart care, we have created designated Provider Care Teams.  These Care Teams include your primary Cardiologist (physician) and Advanced Practice Providers (APPs -  Physician Assistants and Nurse Practitioners) who all work together to provide you with the care you need, when you need it.  We recommend signing up for the patient portal called "MyChart".  Sign up information is provided on this After Visit Summary.  MyChart is used to connect with patients for Virtual Visits (Telemedicine).  Patients are able to view lab/test results, encounter notes, upcoming appointments, etc.  Non-urgent messages can be sent to your provider as well.   To learn more about what you can do with MyChart, go to [email or web address].    Your next appointment:   Will be as planned      Signed, Angelena Form, Hershal Coria  12/07/2019 3:37 AM    [facility] [address], [place], [place]  [address] Phone: [phone]; Fax: [phone]

## 2019-12-06 ENCOUNTER — Telehealth (HOSPITAL_COMMUNITY): Payer: Self-pay | Admitting: Physician Assistant

## 2019-12-06 ENCOUNTER — Other Ambulatory Visit: Payer: Self-pay

## 2019-12-06 ENCOUNTER — Encounter: Payer: Self-pay | Admitting: Physician Assistant

## 2019-12-06 ENCOUNTER — Ambulatory Visit (HOSPITAL_COMMUNITY): Payer: Medicare Other | Attending: Cardiology

## 2019-12-06 ENCOUNTER — Ambulatory Visit (INDEPENDENT_AMBULATORY_CARE_PROVIDER_SITE_OTHER): Payer: Medicare Other | Admitting: Physician Assistant

## 2019-12-06 VITALS — BP 122/68 | HR 59 | Ht 69.0 in | Wt 177.6 lb

## 2019-12-06 DIAGNOSIS — I350 Nonrheumatic aortic (valve) stenosis: Secondary | ICD-10-CM

## 2019-12-06 DIAGNOSIS — Z952 Presence of prosthetic heart valve: Secondary | ICD-10-CM

## 2019-12-06 DIAGNOSIS — R5383 Other fatigue: Secondary | ICD-10-CM

## 2019-12-06 NOTE — Telephone Encounter (Signed)
Patient was scheduled for today for echo at 1pm by Gilford Rile, RN and no order was created.  Will someone please create order and I will link to the appt please for his test today.  Thank you.

## 2019-12-06 NOTE — Patient Instructions (Signed)
Medication Instructions:  Your physician recommends that you continue on your current medications as directed. Please refer to the Current Medication list given to you today.  *If you need a refill on your cardiac medications before your next appointment, please call your pharmacy*   Lab Work: BMET, TSH and CBC today  If you have labs (blood work) drawn today and your tests are completely normal, you will receive your results only by: Marland Kitchen MyChart Message (if you have MyChart) OR . A paper copy in the mail If you have any lab test that is abnormal or we need to change your treatment, we will call you to review the results.   Testing/Procedures: None   Follow-Up: At [facility], you and your health needs are our priority.  As part of our continuing mission to provide you with exceptional heart care, we have created designated Provider Care Teams.  These Care Teams include your primary Cardiologist (physician) and Advanced Practice Providers (APPs -  Physician Assistants and Nurse Practitioners) who all work together to provide you with the care you need, when you need it.  We recommend signing up for the patient portal called "MyChart".  Sign up information is provided on this After Visit Summary.  MyChart is used to connect with patients for Virtual Visits (Telemedicine).  Patients are able to view lab/test results, encounter notes, upcoming appointments, etc.  Non-urgent messages can be sent to your provider as well.   To learn more about what you can do with MyChart, go to [email or web address].    Your next appointment:   Will be as planned

## 2019-12-06 NOTE — Addendum Note (Signed)
Addended by: Thompson Grayer on: 12/06/2019 10:54 AM   Modules accepted: Orders

## 2019-12-07 ENCOUNTER — Telehealth: Payer: Self-pay | Admitting: *Deleted

## 2019-12-07 DIAGNOSIS — I351 Nonrheumatic aortic (valve) insufficiency: Secondary | ICD-10-CM

## 2019-12-07 LAB — TSH: TSH: 1.14 u[IU]/mL (ref 0.450–4.500)

## 2019-12-07 LAB — CBC
Hematocrit: 35.2 % — ABNORMAL LOW (ref 37.5–51.0)
Hemoglobin: 12.3 g/dL — ABNORMAL LOW (ref 13.0–17.7)
MCH: 31.4 pg (ref 26.6–33.0)
MCHC: 34.9 g/dL (ref 31.5–35.7)
MCV: 90 fL (ref 79–97)
Platelets: 141 10*3/uL — ABNORMAL LOW (ref 150–450)
RBC: 3.92 x10E6/uL — ABNORMAL LOW (ref 4.14–5.80)
RDW: 13.3 % (ref 11.6–15.4)
WBC: 8.1 10*3/uL (ref 3.4–10.8)

## 2019-12-07 LAB — BASIC METABOLIC PANEL
BUN/Creatinine Ratio: 17 (ref 10–24)
BUN: 14 mg/dL (ref 8–27)
CO2: 23 mmol/L (ref 20–29)
Calcium: 9.1 mg/dL (ref 8.6–10.2)
Chloride: 103 mmol/L (ref 96–106)
Creatinine, Ser: 0.82 mg/dL (ref 0.76–1.27)
GFR calc Af Amer: 99 mL/min/{1.73_m2} (ref >59)
GFR calc non Af Amer: 86 mL/min/{1.73_m2} (ref >59)
Glucose: 164 mg/dL — ABNORMAL HIGH (ref 65–99)
Potassium: 4.1 mmol/L (ref 3.5–5.2)
Sodium: 138 mmol/L (ref 134–144)

## 2019-12-07 NOTE — Telephone Encounter (Signed)
Left message to call back  

## 2019-12-07 NOTE — Telephone Encounter (Signed)
-----   Message from Eileen Stanford, [place] sent at 12/07/2019  9:49 AM EDT ----- Can you let him know that his valve has a little more leaking than previous, but only in the moderate range. All his labs are stable. I would like to repeat an echo in 6 months. Could you help me get that arranged under my name? Thank you!

## 2019-12-07 NOTE — Telephone Encounter (Signed)
Spoke with pt and reviewed results and recommendations.  Scheduled pt for echo on 06/03/2020.  Pt in agreement with plan and appreciative for call.

## 2019-12-07 NOTE — Telephone Encounter (Signed)
Follow up   Pt is returning call    

## 2019-12-21 ENCOUNTER — Other Ambulatory Visit (HOSPITAL_COMMUNITY): Payer: Medicare Other

## 2019-12-26 DIAGNOSIS — I10 Essential (primary) hypertension: Secondary | ICD-10-CM | POA: Diagnosis not present

## 2019-12-26 DIAGNOSIS — E7800 Pure hypercholesterolemia, unspecified: Secondary | ICD-10-CM | POA: Diagnosis not present

## 2019-12-26 DIAGNOSIS — E1169 Type 2 diabetes mellitus with other specified complication: Secondary | ICD-10-CM | POA: Diagnosis not present

## 2019-12-26 DIAGNOSIS — Z85118 Personal history of other malignant neoplasm of bronchus and lung: Secondary | ICD-10-CM | POA: Diagnosis not present

## 2019-12-26 DIAGNOSIS — R42 Dizziness and giddiness: Secondary | ICD-10-CM | POA: Diagnosis not present

## 2019-12-26 DIAGNOSIS — I480 Paroxysmal atrial fibrillation: Secondary | ICD-10-CM | POA: Diagnosis not present

## 2019-12-26 DIAGNOSIS — Z7984 Long term (current) use of oral hypoglycemic drugs: Secondary | ICD-10-CM | POA: Diagnosis not present

## 2019-12-26 DIAGNOSIS — Z952 Presence of prosthetic heart valve: Secondary | ICD-10-CM | POA: Diagnosis not present

## 2020-01-02 DIAGNOSIS — R42 Dizziness and giddiness: Secondary | ICD-10-CM | POA: Diagnosis not present

## 2020-01-19 ENCOUNTER — Other Ambulatory Visit: Payer: Self-pay | Admitting: Surgery

## 2020-01-19 DIAGNOSIS — Z85118 Personal history of other malignant neoplasm of bronchus and lung: Secondary | ICD-10-CM

## 2020-02-28 ENCOUNTER — Ambulatory Visit
Admission: RE | Admit: 2020-02-28 | Discharge: 2020-02-28 | Disposition: A | Discharge status: Home / Self Care | Payer: Medicare Other | Source: Ambulatory Visit | Attending: Surgery | Admitting: Surgery

## 2020-02-28 ENCOUNTER — Other Ambulatory Visit: Payer: Self-pay

## 2020-02-28 ENCOUNTER — Ambulatory Visit (INDEPENDENT_AMBULATORY_CARE_PROVIDER_SITE_OTHER): Payer: Medicare Other | Admitting: Surgery

## 2020-02-28 ENCOUNTER — Encounter: Payer: Self-pay | Admitting: Surgery

## 2020-02-28 VITALS — BP 137/79 | HR 76 | Temp 97.9°F | Resp 20 | Ht 69.0 in | Wt 176.0 lb

## 2020-02-28 DIAGNOSIS — I2510 Atherosclerotic heart disease of native coronary artery without angina pectoris: Secondary | ICD-10-CM | POA: Diagnosis not present

## 2020-02-28 DIAGNOSIS — C3490 Malignant neoplasm of unspecified part of unspecified bronchus or lung: Secondary | ICD-10-CM | POA: Diagnosis not present

## 2020-02-28 DIAGNOSIS — Z85118 Personal history of other malignant neoplasm of bronchus and lung: Secondary | ICD-10-CM

## 2020-02-28 DIAGNOSIS — I712 Thoracic aortic aneurysm, without rupture: Secondary | ICD-10-CM | POA: Diagnosis not present

## 2020-02-28 DIAGNOSIS — I700 Atherosclerosis of aorta: Secondary | ICD-10-CM | POA: Diagnosis not present

## 2020-02-28 IMAGING — CT CT CHEST W/O CM
2 of 4 series · 13 of 36 positions shown, 16 images · non-contrast
Comparison: [DATE].

CLINICAL DATA: Lung nodule.  Lung cancer.

EXAM:
CT CHEST WITHOUT CONTRAST
TECHNIQUE: Multidetector CT imaging of the chest was performed following the
standard protocol without IV contrast.

[Series 2: chest 2.00 br40 s3 · axial · 0.61mm/px · z∈[+1545,+1797]mm · 10 of 150 slices shown, 13 images (1 of 2)]
[im 12/150  mediastinal]
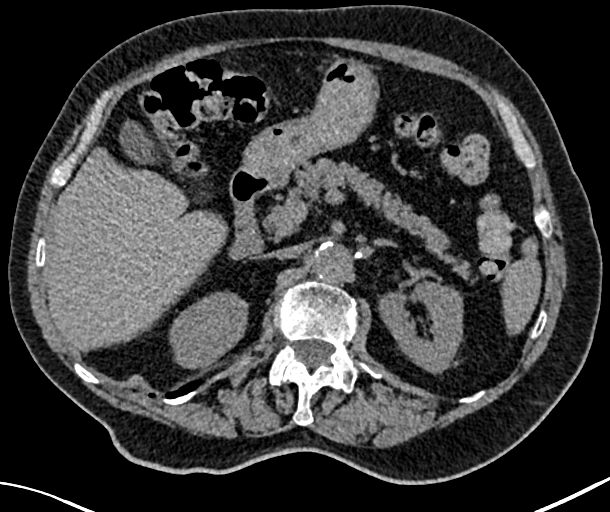
[im 12/150  lung]
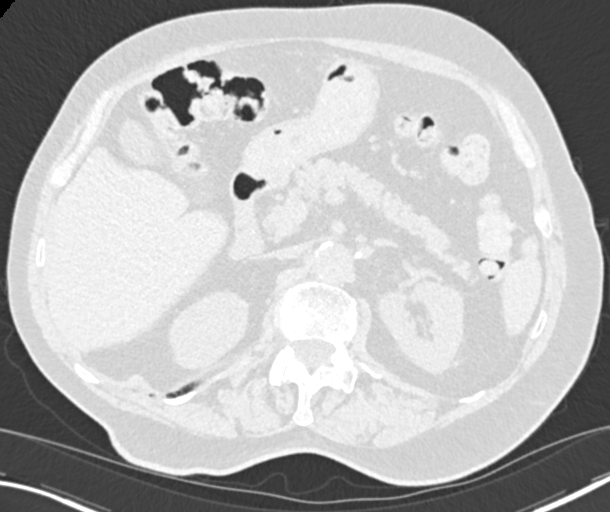
[im 23/150  lung]
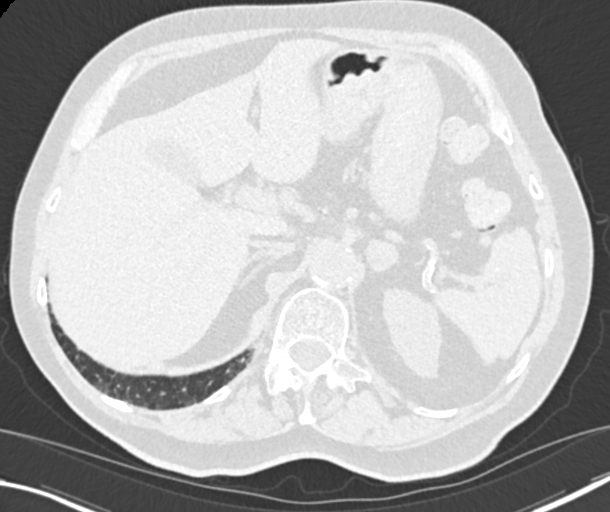
[im 46/150  lung]
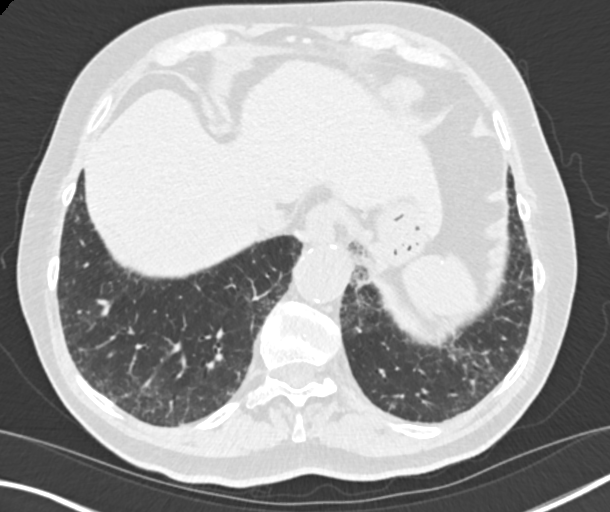
[im 58/150  lung]
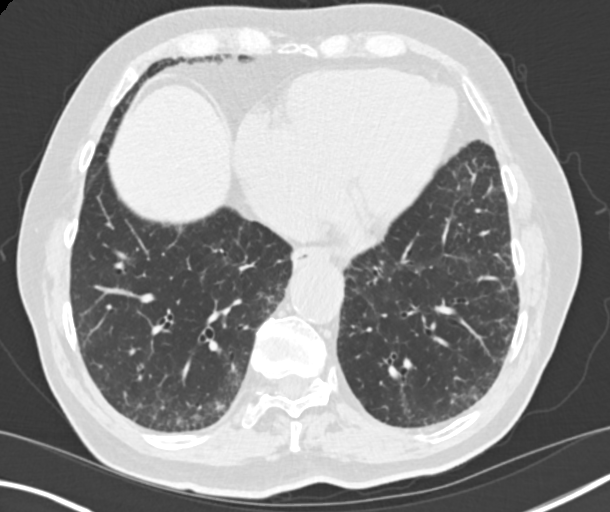
[im 69/150  mediastinal]
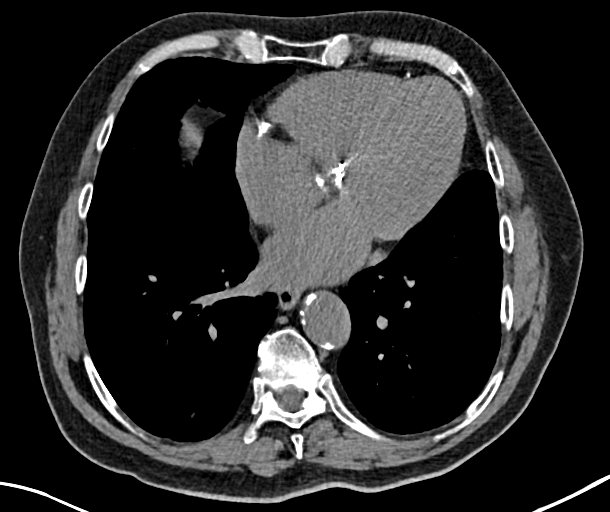
[im 69/150  lung]
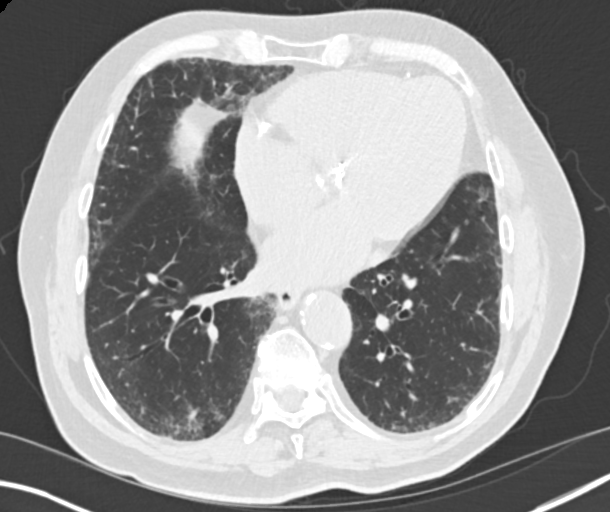
[im 81/150  lung]
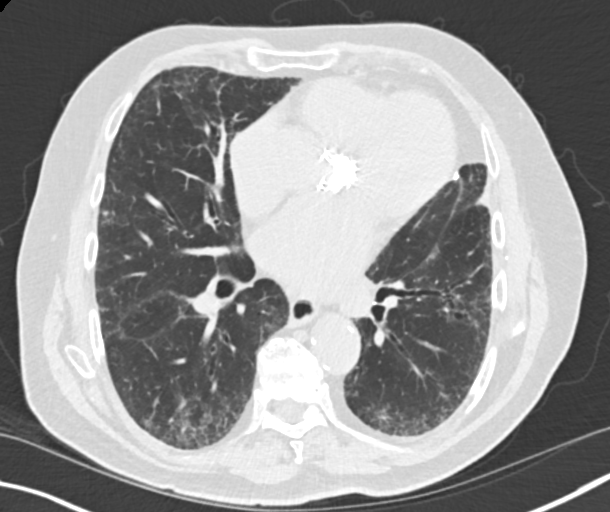
[im 92/150  lung]
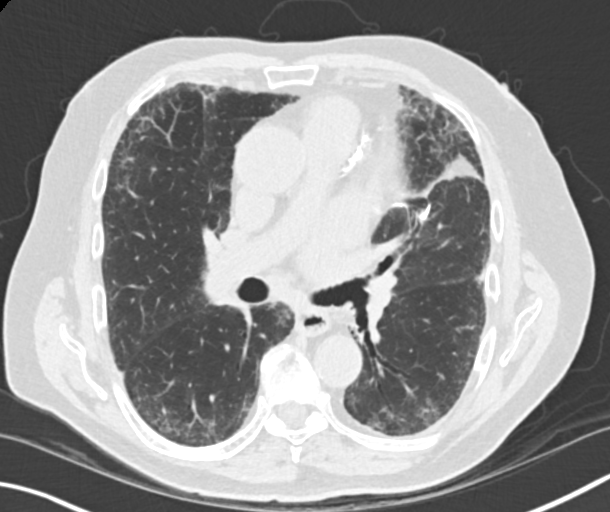
[im 115/150  lung]
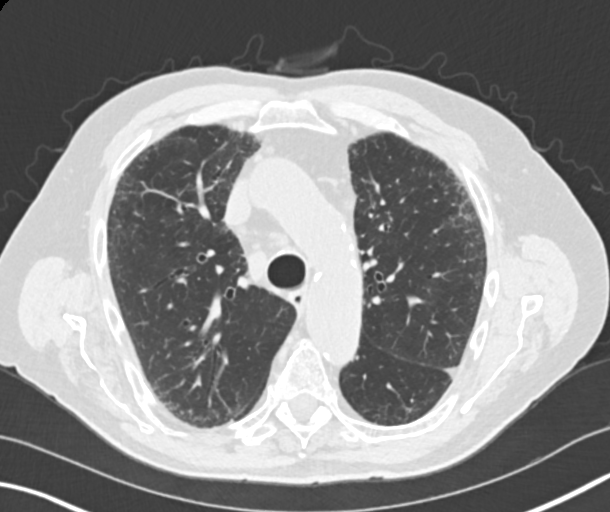
[im 127/150  mediastinal]
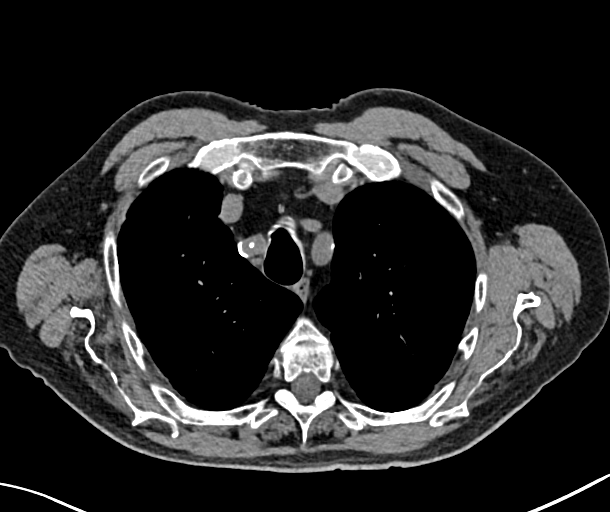
[im 127/150  lung]
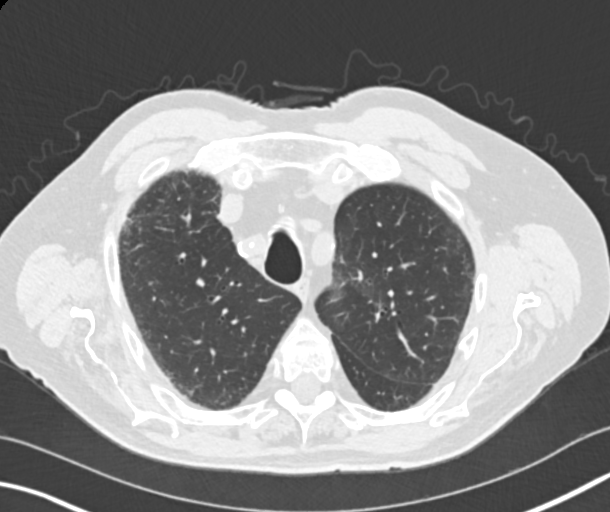
[im 138/150  lung]
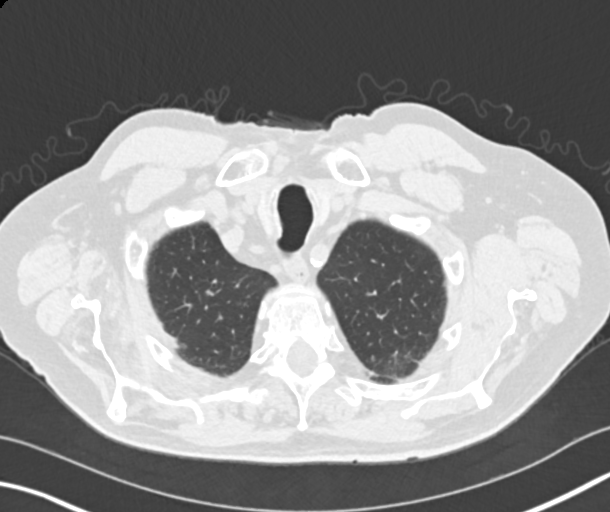

[Series 4: chest 2.00 br40 s3 · coronal · 0.59mm/px · 3 of 156 slices shown (2 of 2)]
[im 32/156  lung]
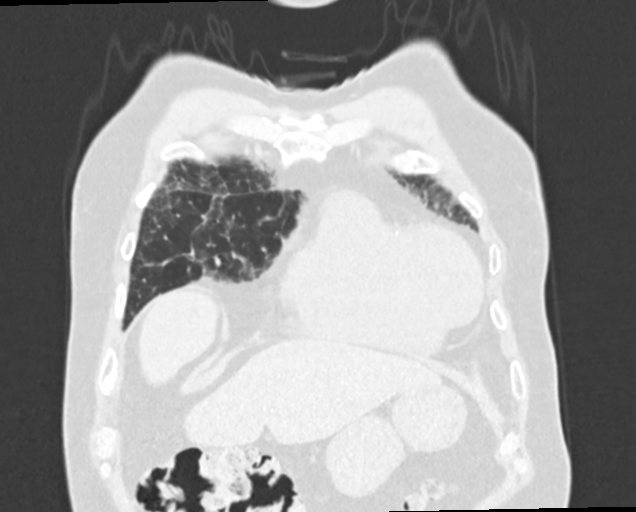
[im 63/156  lung]
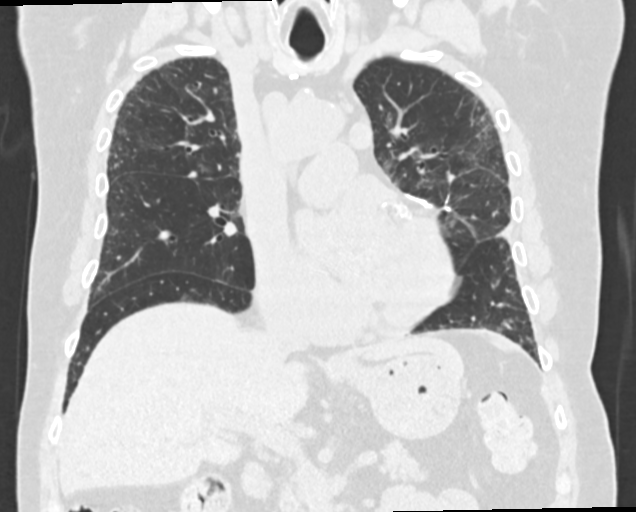
[im 94/156  lung]
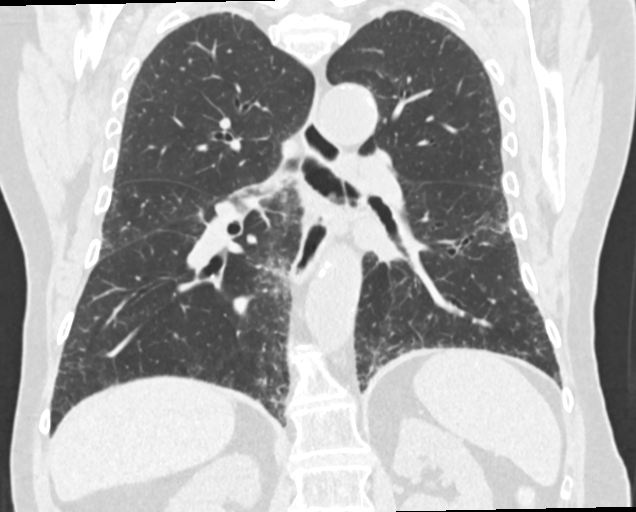

[13 of 36 positions shown; findings below may reference images not displayed]

FINDINGS: Cardiovascular: Atherosclerotic calcification of the aorta and
coronary arteries. Aortic valve replacement. Ascending aorta
measures 4.1 cm. Heart is enlarged. No pericardial effusion.

Mediastinum/Nodes: Mediastinal lymph nodes measure up to 12 mm in
the low right paratracheal station, unchanged. Hilar regions are
difficult to evaluate without IV contrast. No axillary adenopathy.
Esophagus is grossly unremarkable.

Lungs/Pleura: Peripheral and basilar predominant subpleural
reticulation, ground-glass and mild traction bronchiolectasis, as
before. 3 mm posterior right upper lobe nodule ([DATE]), unchanged. 5
mm peripheral right upper lobe nodule (8/42), stable. Postoperative
changes in the lingula. Adjacent 3 mm lingular nodule (8/64),
stable. No pleural fluid. Airway is unremarkable.

Upper Abdomen: Visualized portions of the liver, gallbladder and
right adrenal gland are unremarkable. 1.9 cm chronically present
left adrenal nodule. Visualized portions of the kidneys, spleen,
pancreas, stomach and bowel are grossly unremarkable.

Musculoskeletal: Degenerative changes in the spine. No worrisome
lytic or sclerotic lesions.
IMPRESSION: 1. No evidence of recurrent or metastatic disease. Scattered tiny
pulmonary nodules and borderline enlarged mediastinal lymph nodes
are unchanged.
2. Pulmonary fibrosis, similar and possibly due to nonspecific
interstitial pneumonitis or usual interstitial pneumonitis.
3. Chronically present left adrenal nodule, indicative of a benign
lesion.
4. Ascending Aortic aneurysm NOS ([6I]-[6I]), stable. Recommend
annual imaging followup by CTA or MRA. This recommendation follows
[6I] ACCF/AHA/AATS/ACR/ASA/SCA/MUNGIA/MUNGIA/MUNGIA/MUNGIA Guidelines for the
Diagnosis and Management of Patients with Thoracic Aortic Disease.
Circulation. [6I]; 121: [id number]. Aortic aneurysm NOS
([6I]-[6I]).
5. Aortic atherosclerosis ([6I]-[6I]). Coronary artery
calcification.

## 2020-02-28 NOTE — Progress Notes (Signed)
HPI:  Patient returns for routine postoperative surveillance having undergoneleft muscle-sparing thoracotomy with wedge resection of a left upper lobe lung masson 02/02/2019. 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    He denies any cough or sputum production.  He has continued to abstain from smoking.  He does feel like his energy level is not back to where he would like it.  He denies any chest pain or significant shortness of breath.  He has had no lower extremity edema.  He had a follow-up echocardiogram on 12/06/2019 which showed a normal functioning transcatheter aortic valve with a mean gradient of 17 mmHg.  There was mild to moderate perivalvular leak which was previously noted to be mild.  Left ventricular ejection fraction was 50%.  Current Outpatient Medications  Medication Sig Dispense Refill   acetaminophen (TYLENOL) 500 MG tablet Take 2 tablets (1,000 mg total) by mouth every 6 (six) hours as needed for mild pain or fever. 30 tablet 0   diltiazem (CARDIZEM SR) 60 MG 12 hr capsule Take 1 capsule (60 mg total) by mouth every 12 (twelve) hours. 60 capsule 7   dorzolamide-timolol (COSOPT) 22.3-6.8 MG/ML ophthalmic solution 1 drop 2 (two) times daily.     glimepiride (AMARYL) 2 MG tablet Take 2 mg by mouth daily.     metFORMIN (GLUCOPHAGE-XR) 500 MG 24 hr tablet Take 1,000 mg by mouth every evening.      metoprolol succinate (TOPROL-XL) 25 MG 24 hr tablet Take 50 mg by mouth daily.     Multiple Vitamins-Minerals (MULTIVITAMIN WITH MINERALS) tablet Take 1 tablet by mouth daily.     Multiple Vitamins-Minerals (ZINC PO) Take 1 tablet by mouth daily.     pantoprazole (PROTONIX) 40 MG tablet Take 40 mg by mouth daily.     simvastatin (ZOCOR) 40 MG tablet Take 40 mg by mouth at bedtime.       XARELTO 20 MG TABS tablet TAKE 1 TABLET BY MOUTH  DAILY WITH SUPPER 90  tablet 1   No current facility-administered medications for this visit.     Physical Exam: BP 137/79    Pulse 76    Temp 97.9 F (36.6 C) (Skin)    Resp 20    Ht 5\' 9"  (1.753 m)    Wt 176 lb (79.8 kg)    SpO2 91% Comment: RA   BMI 25.99 kg/m  He looks well. There is no cervical or supraclavicular adenopathy. Cardiac exam shows a irregular rate and rhythm with normal heart sounds.  I do not hear a murmur. Lungs are clear. There is no peripheral edema.  Diagnostic Tests:  ECHOCARDIOGRAM REPORT       Patient Name:  Vincent Velez Date of Exam: 12/06/2019  Medical Rec #: [id number]    Height:    69.0 in  Accession #:  [id number]    Weight:    181.0 lb  Date of Birth: 04/19/1941    BSA:     1.980 m  Patient Age:  79 years     BP:      134/78 mmHg  Patient Gender: M        HR:      54 bpm.  Exam Location: [place]   Procedure: 2D Echo, Cardiac Doppler and Color Doppler   Indications:  Z95.2 post TAVR    History:    Patient has prior history of Echocardiogram examinations,  most         recent 12/28/2018. COPD and AAA without rupture, PVD,         Arrythmias:Atrial Fibrillation, Signs/Symptoms:Murmur;  Risk         Factors:Diabetes and HLD.         Aortic Valve: 26 mm Edwards Sapien prosthetic, stented  (TAVR)         valve is present in the aortic position. Procedure Date:         12/06/18.    Sonographer:  Marygrace Drought RCS  Referring Phys: [id number] [place]    1. Left ventricular ejection fraction, by estimation, is 50 to 55%. The  left ventricle has low normal function. The left ventricle has no regional  wall motion abnormalities. N/A due to atrial flutter.  2. Right ventricular systolic function is normal. The right ventricular  size is normal. There is normal pulmonary artery systolic pressure.  3. Left atrial size  was severely dilated.  4. Right atrial size was moderately dilated.  5. The mitral valve is normal in structure. Trivial mitral valve  regurgitation. No evidence of mitral stenosis.  6. The aortic valve has been repaired/replaced. Aortic valve  regurgitation is mild to moderate. There is a 26 mm Edwards Sapien  prosthetic (TAVR) valve present in the aortic position. Procedure Date:  12/06/18. Aortic regurgitation PHT measures 357 msec.  Aortic valve mean gradient measures 17.0 mmHg.  7. Aortic dilatation noted. There is moderate dilatation of the ascending  aorta measuring 38 mm.  8. The inferior vena cava is normal in size with greater than 50%  respiratory variability, suggesting right atrial pressure of 3 mmHg.   Conclusion(s)/Recommendation(s): TAVR now with mild-moderate perivalvular  leak (was previously mild). AV mean gradient similar to prior. Patient  appears to be in atrial flutter throughout the study.   FINDINGS  Left Ventricle: Left ventricular ejection fraction, by estimation, is 50  to 55%. The left ventricle has low normal function. The left ventricle has  no regional wall motion abnormalities. The left ventricular internal  cavity size was normal in size.  There is no left ventricular hypertrophy. N/A due to atrial flutter.   Right Ventricle: The right ventricular size is normal. No increase in  right ventricular wall thickness. Right ventricular systolic function is  normal. There is normal pulmonary artery systolic pressure. The tricuspid  regurgitant velocity is 2.41 m/s, and  with an assumed right atrial pressure of 3 mmHg, the estimated right  ventricular systolic pressure is 30.8 mmHg.   Left Atrium: Left atrial size was severely dilated.   Right Atrium: Right atrial size was moderately dilated.   Pericardium: There is no evidence of pericardial effusion.   Mitral Valve: The mitral valve is normal in structure. Trivial mitral  valve regurgitation.  No evidence of mitral valve stenosis.   Tricuspid Valve: The tricuspid valve is normal in structure. Tricuspid  valve regurgitation is trivial. No evidence of tricuspid stenosis.   Aortic Valve: Perivalvular AI is now mild-moderate. Last available  gradients from 11/2018 noted as peak gradient 28 mmHg, mean gradient 17.6  mmHg. AVA (mean) 2.62 cm2 and AVA (VTI) 0.518. The aortic valve has been  repaired/replaced. Aortic valve  regurgitation is mild to moderate. Aortic regurgitation PHT measures 357  msec. Aortic valve mean gradient measures 17.0 mmHg. Aortic valve peak  gradient measures 30.5 mmHg. Aortic valve area, by VTI measures 0.75 cm.  There is a 26 mm Edwards Sapien  prosthetic, stented (TAVR) valve present in the aortic position. Procedure  Date: 12/06/18.   Pulmonic Valve: The pulmonic valve was not well visualized. Pulmonic valve  regurgitation is not visualized. No evidence of pulmonic stenosis.   Aorta: Aortic dilatation noted. There is moderate dilatation of the  ascending aorta measuring 38 mm.   Venous: The inferior vena cava is normal in size with greater than 50%  respiratory variability, suggesting right atrial pressure of 3 mmHg.   IAS/Shunts: The atrial septum is grossly normal.     LEFT VENTRICLE  PLAX 2D  LVIDd:     5.40 cm Diastology  LVIDs:     3.90 cm LV e' lateral:  8.81 cm/s  LV PW:     1.00 cm LV E/e' lateral: 10.2  LV IVS:    1.20 cm LV e' medial:  7.83 cm/s  LVOT diam:   2.10 cm LV E/e' medial: 11.5  LV SV:     43  LV SV Index:  22  LVOT Area:   3.46 cm     RIGHT VENTRICLE  RV Basal diam: 3.80 cm  RV S prime:   12.80 cm/s  TAPSE (M-mode): 2.3 cm  RVSP:      26.2 mmHg   LEFT ATRIUM       Index    RIGHT ATRIUM      Index  LA diam:    4.40 cm 2.22 cm/m RA Pressure: 3.00 mmHg  LA Vol (A2C):  105.0 ml 53.03 ml/m RA Area:   23.60 cm  LA Vol (A4C):  105.0 ml 53.03 ml/m RA  Volume:  76.00 ml 38.38 ml/m  LA Biplane Vol: 105.0 ml 53.03 ml/m  AORTIC VALVE  AV Area (Vmax):  0.75 cm  AV Area (Vmean):  0.65 cm  AV Area (VTI):   0.75 cm  AV Vmax:      276.00 cm/s  AV Vmean:     191.000 cm/s  AV VTI:      0.575 m  AV Peak Grad:   30.5 mmHg  AV Mean Grad:   17.0 mmHg  LVOT Vmax:     59.40 cm/s  LVOT Vmean:    35.900 cm/s  LVOT VTI:     0.125 m  LVOT/AV VTI ratio: 0.22  AI PHT:      357 msec    AORTA  Ao Root diam: 2.60 cm  Ao Asc diam: 3.80 cm   MITRAL VALVE        TRICUSPID VALVE  MV Area (PHT):       TR Peak grad:  23.2 mmHg  MV Decel Time:       TR Vmax:    241.00 cm/s  MV E velocity: 90.20 cm/s Estimated RAP: 3.00 mmHg               RVSP:      26.2 mmHg                 SHUNTS               Systemic VTI: 0.13 m               Systemic Diam: 2.10 cm   Buford Dresser MD  Electronically signed by Buford Dresser MD  Signature Date/Time: 12/06/2019/11:25:19 PM    CLINICAL DATA:  Lung nodule.  Lung cancer.  EXAM: CT CHEST WITHOUT CONTRAST  TECHNIQUE: Multidetector CT imaging of the chest was performed following the standard protocol without IV contrast.  COMPARISON:  08/23/2019.  FINDINGS: Cardiovascular: Atherosclerotic calcification of the aorta and coronary arteries. Aortic valve replacement. Ascending aorta measures 4.1 cm. Heart is enlarged. No pericardial effusion.  Mediastinum/Nodes: Mediastinal lymph nodes measure up to 12 mm in the low right paratracheal station, unchanged. Hilar regions are difficult to evaluate without IV contrast. No axillary adenopathy. Esophagus is grossly unremarkable.  Lungs/Pleura: Peripheral and basilar predominant subpleural reticulation, ground-glass and mild traction bronchiolectasis, as before. 3 mm posterior right upper lobe nodule (8/20),  unchanged. 5 mm peripheral right upper lobe nodule (8/42), stable. Postoperative changes in the lingula. Adjacent 3 mm lingular nodule (8/64), stable. No pleural fluid. Airway is unremarkable.  Upper Abdomen: Visualized portions of the liver, gallbladder and right adrenal gland are unremarkable. 1.9 cm chronically present left adrenal nodule. Visualized portions of the kidneys, spleen, pancreas, stomach and bowel are grossly unremarkable.  Musculoskeletal: Degenerative changes in the spine. No worrisome lytic or sclerotic lesions.  IMPRESSION: 1. No evidence of recurrent or metastatic disease. Scattered tiny pulmonary nodules and borderline enlarged mediastinal lymph nodes are unchanged. 2. Pulmonary fibrosis, similar and possibly due to nonspecific interstitial pneumonitis or usual interstitial pneumonitis. 3. Chronically present left adrenal nodule, indicative of a benign lesion. 4. Ascending Aortic aneurysm NOS (ICD10-I71.9), stable. Recommend annual imaging followup by CTA or MRA. This recommendation follows 2010 ACCF/AHA/AATS/ACR/ASA/SCA/SCAI/SIR/STS/SVM Guidelines for the Diagnosis and Management of Patients with Thoracic Aortic Disease. Circulation. 2010; 121: X412-I786. Aortic aneurysm NOS (ICD10-I71.9). 5. Aortic atherosclerosis (ICD10-I70.0). Coronary artery calcification.   Electronically Signed   By: Lorin Picket M.D.   On: 02/28/2020 10:13  Impression:  He is 1 year out from lung cancer resection with no evidence of recurrent or metastatic cancer on CT scan of the chest.  I reviewed the images with him and answered his questions.  I recommend a follow-up scan in 6 months.  He does report exertional fatigue and his 1 year follow-up echocardiogram after TAVR showed a mild -moderate perivalvular leak which was previously noted to be mild.  I have personally reviewed his postoperative day 1 echo and compared to the one at 1 year and I do not think there is been  a significant change in the paravalvular leak visually.  I would not expect this degree of aortic insufficiency to give him any symptoms with normal left ventricular systolic function. He should have a follow-up echocardiogram in 6 months.  Plan:  He will return to see me in 6 months with a CT scan of the chest for lung cancer surveillance.  He will have a follow-up echocardiogram by cardiology 6 months after his last echocardiogram for follow-up of his transcatheter aortic valve and paravalvular leak.  I spent 20 minutes performing this established patient evaluation and > 50% of this time was spent face to face counseling and coordinating the surveillance of his previously resected lung cancer.   Gaye Pollack, MD [facility] [phone]

## 2020-02-29 DIAGNOSIS — I10 Essential (primary) hypertension: Secondary | ICD-10-CM | POA: Diagnosis not present

## 2020-02-29 DIAGNOSIS — I480 Paroxysmal atrial fibrillation: Secondary | ICD-10-CM | POA: Diagnosis not present

## 2020-02-29 DIAGNOSIS — C61 Malignant neoplasm of prostate: Secondary | ICD-10-CM | POA: Diagnosis not present

## 2020-02-29 DIAGNOSIS — E7800 Pure hypercholesterolemia, unspecified: Secondary | ICD-10-CM | POA: Diagnosis not present

## 2020-02-29 DIAGNOSIS — H409 Unspecified glaucoma: Secondary | ICD-10-CM | POA: Diagnosis not present

## 2020-02-29 DIAGNOSIS — E1169 Type 2 diabetes mellitus with other specified complication: Secondary | ICD-10-CM | POA: Diagnosis not present

## 2020-02-29 DIAGNOSIS — E119 Type 2 diabetes mellitus without complications: Secondary | ICD-10-CM | POA: Diagnosis not present

## 2020-02-29 DIAGNOSIS — E7849 Other hyperlipidemia: Secondary | ICD-10-CM | POA: Diagnosis not present

## 2020-02-29 DIAGNOSIS — Z85118 Personal history of other malignant neoplasm of bronchus and lung: Secondary | ICD-10-CM | POA: Diagnosis not present

## 2020-03-05 DIAGNOSIS — H401132 Primary open-angle glaucoma, bilateral, moderate stage: Secondary | ICD-10-CM | POA: Diagnosis not present

## 2020-04-30 DIAGNOSIS — E7800 Pure hypercholesterolemia, unspecified: Secondary | ICD-10-CM | POA: Diagnosis not present

## 2020-04-30 DIAGNOSIS — E1169 Type 2 diabetes mellitus with other specified complication: Secondary | ICD-10-CM | POA: Diagnosis not present

## 2020-04-30 DIAGNOSIS — I10 Essential (primary) hypertension: Secondary | ICD-10-CM | POA: Diagnosis not present

## 2020-04-30 DIAGNOSIS — E785 Hyperlipidemia, unspecified: Secondary | ICD-10-CM | POA: Diagnosis not present

## 2020-04-30 DIAGNOSIS — H409 Unspecified glaucoma: Secondary | ICD-10-CM | POA: Diagnosis not present

## 2020-04-30 DIAGNOSIS — I480 Paroxysmal atrial fibrillation: Secondary | ICD-10-CM | POA: Diagnosis not present

## 2020-04-30 DIAGNOSIS — Z85118 Personal history of other malignant neoplasm of bronchus and lung: Secondary | ICD-10-CM | POA: Diagnosis not present

## 2020-04-30 DIAGNOSIS — C61 Malignant neoplasm of prostate: Secondary | ICD-10-CM | POA: Diagnosis not present

## 2020-04-30 DIAGNOSIS — K219 Gastro-esophageal reflux disease without esophagitis: Secondary | ICD-10-CM | POA: Diagnosis not present

## 2020-04-30 DIAGNOSIS — E139 Other specified diabetes mellitus without complications: Secondary | ICD-10-CM | POA: Diagnosis not present

## 2020-05-08 DIAGNOSIS — Z23 Encounter for immunization: Secondary | ICD-10-CM | POA: Diagnosis not present

## 2020-06-03 ENCOUNTER — Other Ambulatory Visit (HOSPITAL_COMMUNITY): Payer: Medicare Other

## 2020-06-13 ENCOUNTER — Other Ambulatory Visit: Payer: Self-pay

## 2020-06-13 DIAGNOSIS — Z952 Presence of prosthetic heart valve: Secondary | ICD-10-CM

## 2020-06-18 ENCOUNTER — Other Ambulatory Visit (HOSPITAL_COMMUNITY): Payer: Medicare Other

## 2020-06-18 ENCOUNTER — Ambulatory Visit: Payer: Medicare Other

## 2020-06-19 ENCOUNTER — Ambulatory Visit: Payer: Medicare Other | Admitting: Cardiology

## 2020-07-04 ENCOUNTER — Ambulatory Visit (INDEPENDENT_AMBULATORY_CARE_PROVIDER_SITE_OTHER): Payer: Medicare Other | Admitting: Physician Assistant

## 2020-07-04 ENCOUNTER — Other Ambulatory Visit: Payer: Self-pay

## 2020-07-04 ENCOUNTER — Ambulatory Visit (HOSPITAL_COMMUNITY)
Admission: RE | Admit: 2020-07-04 | Discharge: 2020-07-04 | Disposition: A | Discharge status: Home / Self Care | Payer: Medicare Other | Source: Ambulatory Visit | Attending: Vascular Surgery | Admitting: Vascular Surgery

## 2020-07-04 VITALS — BP 129/84 | HR 89 | Temp 97.4°F | Resp 18 | Ht 69.0 in | Wt 170.0 lb

## 2020-07-04 DIAGNOSIS — I714 Abdominal aortic aneurysm, without rupture, unspecified: Secondary | ICD-10-CM

## 2020-07-04 NOTE — Progress Notes (Signed)
[facility] [place] HISTORY AND PHYSICAL   History of Present Illness:  Patient is a 80 y.o. year old male who presents for evaluation of abdominal aortic aneurysm.  He is s/p EVAR in 2010 by Dr. Oneida Alar.  He denise lumbar pain or abdominal pain and no symptoms of claudication.    He takes a daily Statin and Xarelto for Afib.    Past Medical History:  Diagnosis Date  . AAA (abdominal aortic aneurysm) ([place])   . Anxiety   . Arthritis   . Asthma    when younger  . Atrial fibrillation ([place])   . CAD (coronary artery disease)   . Cataract    removed both  . COPD (chronic obstructive pulmonary disease) ([place])   . Diabetes mellitus    Type II  . Dysrhythmia    afib  . Esophageal reflux   . Family history of malignant neoplasm of gastrointestinal tract   . Heart murmur   . Hiatal hernia   . Hyperlipemia   . Hypertension   . Lung nodule    a. PET scan highly suspicious for malignancy. Bronch with biopsy to be done after TAVR  . Malignant neoplasm of prostate ([place])    prostate   . Peripheral vascular disease ([place])   . Pneumonia   . Stricture and stenosis of esophagus     Past Surgical History:  Procedure Laterality Date  . ABDOMINAL AORTA STENT    . CARDIAC CATHETERIZATION    . CARDIOVERSION N/A 12/04/2015   Procedure: CARDIOVERSION;  Surgeon: Sanda Klein, MD;  Location: [facility];  Service: Cardiovascular;  Laterality: N/A;  . COLONOSCOPY    . FINGER SURGERY Right    middle finger- amputation  . INSERTION PROSTATE RADIATION SEED    . MEDIASTINOSCOPY N/A 01/06/2019   Procedure: MEDIASTINOSCOPY WITH BIOPIES;  Surgeon: Gaye Pollack, MD;  Location: [facility] OR;  Service: Thoracic;  Laterality: N/A;  . RIGHT/LEFT HEART CATH AND CORONARY ANGIOGRAPHY N/A 10/26/2018   Procedure: RIGHT/LEFT HEART CATH AND CORONARY ANGIOGRAPHY;  Surgeon: Burnell Blanks, MD;  Location: [facility];  Service: Cardiovascular;  Laterality: N/A;  . TEE WITHOUT CARDIOVERSION  N/A 12/06/2018   Procedure: TRANSESOPHAGEAL ECHOCARDIOGRAM (TEE);  Surgeon: Burnell Blanks, MD;  Location: [facility];  Service: Open Heart Surgery;  Laterality: N/A;  . TRANSCATHETER AORTIC VALVE REPLACEMENT, TRANSFEMORAL N/A 12/06/2018   Procedure: TRANSCATHETER AORTIC VALVE REPLACEMENT, TRANSFEMORAL;  Surgeon: Burnell Blanks, MD;  Location: [facility];  Service: Open Heart Surgery;  Laterality: N/A;  . UPPER GASTROINTESTINAL ENDOSCOPY    . VIDEO ASSISTED THORACOSCOPY (VATS)/THOROCOTOMY Left 02/02/2019   Procedure: VIDEO ASSISTED THORACOSCOPY (VATS)/THOROCOTOMY;  Surgeon: Gaye Pollack, MD;  Location: [facility];  Service: Thoracic;  Laterality: Left;  Marland Kitchen VIDEO BRONCHOSCOPY N/A 01/06/2019   Procedure: VIDEO BRONCHOSCOPY;  Surgeon: Gaye Pollack, MD;  Location: [facility] OR;  Service: Thoracic;  Laterality: N/A;  . WEDGE RESECTION Left 02/02/2019   Procedure: LUNG RESECTION;  Surgeon: Gaye Pollack, MD;  Location: [facility] OR;  Service: Thoracic;  Laterality: Left;     Social History Social History   Tobacco Use  . Smoking status: Former Smoker    Types: Pipe, Cigars    Quit date: 12/2018    Years since quitting: 1.5  . Smokeless tobacco: Never Used  Vaping Use  . Vaping Use: Never used  Substance Use Topics  . Alcohol use: Not Currently    Alcohol/week: 0.0 standard drinks  Comment: occ  . Drug use: No    Family History Family History  Problem Relation Age of Onset  . Colon cancer Father        questionable  . Diabetes Mother   . Heart disease Mother        Heart Disease before age 5  . Deep vein thrombosis Mother   . Hyperlipidemia Mother   . Hypertension Mother   . Varicose Veins Mother   . Hypertension Brother   . Heart attack Brother   . Prostate cancer Brother   . Colon polyps Neg Hx   . Esophageal cancer Neg Hx   . Rectal cancer Neg Hx   . Stomach cancer Neg Hx     Allergies  Allergies  Allergen Reactions  . Macrolides And Ketolides Other  (See Comments)     Current Outpatient Medications  Medication Sig Dispense Refill  . acetaminophen (TYLENOL) 500 MG tablet Take 2 tablets (1,000 mg total) by mouth every 6 (six) hours as needed for mild pain or fever. 30 tablet 0  . diltiazem (CARDIZEM SR) 60 MG 12 hr capsule Take 1 capsule (60 mg total) by mouth every 12 (twelve) hours. 60 capsule 7  . dorzolamide-timolol (COSOPT) 22.3-6.8 MG/ML ophthalmic solution 1 drop 2 (two) times daily.    Marland Kitchen glimepiride (AMARYL) 2 MG tablet Take 2 mg by mouth daily.    . metFORMIN (GLUCOPHAGE-XR) 500 MG 24 hr tablet Take 1,000 mg by mouth every evening.     . metoprolol succinate (TOPROL-XL) 25 MG 24 hr tablet Take 50 mg by mouth daily.    . Multiple Vitamins-Minerals (MULTIVITAMIN WITH MINERALS) tablet Take 1 tablet by mouth daily.    . Multiple Vitamins-Minerals (ZINC PO) Take 1 tablet by mouth daily.    . pantoprazole (PROTONIX) 40 MG tablet Take 40 mg by mouth daily.    . simvastatin (ZOCOR) 40 MG tablet Take 40 mg by mouth at bedtime.    Alveda Reasons 20 MG TABS tablet TAKE 1 TABLET BY MOUTH  DAILY WITH SUPPER 90 tablet 1   No current facility-administered medications for this visit.    ROS:   General:  No weight loss, Fever, chills  HEENT: No recent headaches, no nasal bleeding, no visual changes, no sore throat  Neurologic: No dizziness, blackouts, seizures. No recent symptoms of stroke or mini- stroke. No recent episodes of slurred speech, or temporary blindness.  Cardiac: No recent episodes of chest pain/pressure, no shortness of breath at rest.  No shortness of breath with exertion.  Denies history of atrial fibrillation or irregular heartbeat  Vascular: No history of rest pain in feet.  No history of claudication.  No history of non-healing ulcer, No history of DVT   Pulmonary: No home oxygen, no productive cough, no hemoptysis,  No asthma or wheezing  Musculoskeletal:  [ x] Arthritis, [ ]  Low back pain,  [ ]  Joint  pain  Hematologic:No history of hypercoagulable state.  No history of easy bleeding.  No history of anemia  Gastrointestinal: No hematochezia or melena,  No gastroesophageal reflux, no trouble swallowing  Urinary: [ ]  chronic Kidney disease, [ ]  on HD - [ ]  MWF or [ ]  TTHS, [ ]  Burning with urination, [ ]  Frequent urination, [ ]  Difficulty urinating;   Skin: No rashes  Psychological: No history of anxiety,  No history of depression   Physical Examination  Vitals:   07/04/20 1011  BP: 129/84  Pulse: 89  Resp: 18  Temp: (!)  97.4 F (36.3 C)  TempSrc: Temporal  SpO2: 97%  Weight: 170 lb (77.1 kg)  Height: 5\' 9"  (1.753 m)    Body mass index is 25.1 kg/m.  General:  Alert and oriented, no acute distress HEENT: Normal Neck: No bruit or JVD Pulmonary: Clear to auscultation bilaterally Cardiac: Regular Rate and Rhythm without murmur Gastrointestinal: Soft, non-tender, non-distended, no mass, no scars Skin: No rash Extremity Pulses:  2+ radial, brachial, femoral, dorsalis pedis pulses bilaterally Musculoskeletal: No deformity or edema  Neurologic: Upper and lower extremity motor 5/5 and symmetric  DATA:    Endovascular Aortic Repair (EVAR):  +----------+----------------+-------------------+-------------------+       Diameter AP (cm)Diameter Trans (cm)Velocities (cm/sec)  +----------+----------------+-------------------+-------------------+  Aorta   4.50      4.10        77           +----------+----------------+-------------------+-------------------+  Right Limb1.30      1.40        107          +----------+----------------+-------------------+-------------------+  Left Limb 1.50      1.50        109          +----------+----------------+-------------------+-------------------+    Summary:   Abdominal Aorta: Patent endovascular aneurysm repair with no evidence of  endoleak.  The largest aortic diameter remains essentially unchanged from  prior exam.    ASSESSMENT:  S/P EVAR for asymptomatic AAA The transverse AAA sack has decreased in size on the duplex. He remains asymptomatic and has palpable pedal pulses.   PLAN: He will return for repeat EVAR duplex in 1 year.  If he develops abdominal or lumbar pain he will call.  Other wise he will participates in daily activities as he tolerates.    Roxy Horseman PA-C [facility] [place] Office: [phone]  MD on call Stanford Breed

## 2020-07-08 ENCOUNTER — Other Ambulatory Visit: Payer: Self-pay | Admitting: Surgery

## 2020-07-08 DIAGNOSIS — C3490 Malignant neoplasm of unspecified part of unspecified bronchus or lung: Secondary | ICD-10-CM

## 2020-07-09 DIAGNOSIS — H401132 Primary open-angle glaucoma, bilateral, moderate stage: Secondary | ICD-10-CM | POA: Diagnosis not present

## 2020-07-17 ENCOUNTER — Ambulatory Visit (HOSPITAL_COMMUNITY): Payer: Medicare Other | Attending: Cardiology

## 2020-07-17 ENCOUNTER — Other Ambulatory Visit: Payer: Self-pay

## 2020-07-17 DIAGNOSIS — Z952 Presence of prosthetic heart valve: Secondary | ICD-10-CM | POA: Insufficient documentation

## 2020-07-17 LAB — ECHOCARDIOGRAM COMPLETE
AR max vel: 1.13 cm2
AV Area VTI: 1.26 cm2
AV Area mean vel: 1.19 cm2
AV Mean grad: 9.6 mmHg
AV Peak grad: 18.6 mmHg
Ao pk vel: 2.16 m/s
Area-P 1/2: 3.68 cm2
P 1/2 time: 507 msec
S' Lateral: 3.6 cm

## 2020-07-19 ENCOUNTER — Telehealth: Payer: Self-pay

## 2020-07-19 NOTE — Telephone Encounter (Signed)
-----   Message from Eileen Stanford, PA-C sent at 07/18/2020 10:20 AM EST ----- 6 months follow up echo shows stable mild- mod PVL of TAVR valve. Gradients look good. EF 50-55%. He sees Dr. Marlou Porch in the office next week which he should keep.

## 2020-07-19 NOTE — Telephone Encounter (Signed)
Left the patient a message (2nd attempt) to call back regarding results.

## 2020-07-19 NOTE — Telephone Encounter (Signed)
Pt is returning call.  

## 2020-07-19 NOTE — Telephone Encounter (Signed)
Reviewed results with patient who verbalized understanding.    He will keep appointment with Dr. Marlou Porch next week.

## 2020-07-23 DIAGNOSIS — E1169 Type 2 diabetes mellitus with other specified complication: Secondary | ICD-10-CM | POA: Diagnosis not present

## 2020-07-23 DIAGNOSIS — E119 Type 2 diabetes mellitus without complications: Secondary | ICD-10-CM | POA: Diagnosis not present

## 2020-07-23 DIAGNOSIS — E7800 Pure hypercholesterolemia, unspecified: Secondary | ICD-10-CM | POA: Diagnosis not present

## 2020-07-23 DIAGNOSIS — Z85118 Personal history of other malignant neoplasm of bronchus and lung: Secondary | ICD-10-CM | POA: Diagnosis not present

## 2020-07-23 DIAGNOSIS — C61 Malignant neoplasm of prostate: Secondary | ICD-10-CM | POA: Diagnosis not present

## 2020-07-23 DIAGNOSIS — I10 Essential (primary) hypertension: Secondary | ICD-10-CM | POA: Diagnosis not present

## 2020-07-23 DIAGNOSIS — I480 Paroxysmal atrial fibrillation: Secondary | ICD-10-CM | POA: Diagnosis not present

## 2020-07-23 DIAGNOSIS — K219 Gastro-esophageal reflux disease without esophagitis: Secondary | ICD-10-CM | POA: Diagnosis not present

## 2020-07-23 DIAGNOSIS — E139 Other specified diabetes mellitus without complications: Secondary | ICD-10-CM | POA: Diagnosis not present

## 2020-07-23 DIAGNOSIS — H409 Unspecified glaucoma: Secondary | ICD-10-CM | POA: Diagnosis not present

## 2020-07-24 ENCOUNTER — Encounter: Payer: Self-pay | Admitting: Cardiology

## 2020-07-24 ENCOUNTER — Ambulatory Visit (INDEPENDENT_AMBULATORY_CARE_PROVIDER_SITE_OTHER): Payer: Medicare Other | Admitting: Cardiology

## 2020-07-24 ENCOUNTER — Other Ambulatory Visit: Payer: Self-pay

## 2020-07-24 VITALS — BP 120/60 | HR 70 | Ht 69.0 in | Wt 173.0 lb

## 2020-07-24 DIAGNOSIS — I351 Nonrheumatic aortic (valve) insufficiency: Secondary | ICD-10-CM | POA: Diagnosis not present

## 2020-07-24 DIAGNOSIS — I10 Essential (primary) hypertension: Secondary | ICD-10-CM | POA: Diagnosis not present

## 2020-07-24 DIAGNOSIS — I714 Abdominal aortic aneurysm, without rupture, unspecified: Secondary | ICD-10-CM

## 2020-07-24 DIAGNOSIS — I4819 Other persistent atrial fibrillation: Secondary | ICD-10-CM | POA: Diagnosis not present

## 2020-07-24 DIAGNOSIS — Z952 Presence of prosthetic heart valve: Secondary | ICD-10-CM | POA: Diagnosis not present

## 2020-07-24 NOTE — Progress Notes (Signed)
Cardiology Office Note:    Date:  07/24/2020   ID:  Vincent Velez, DOB Apr 23, 1941, MRN [id number]  PCP:  [name], MD   [place]  Cardiologist:  Candee Furbish, MD  Advanced Practice Provider:  No care team member to display Electrophysiologist:  None    { Click here to update then REFRESH NOTE - MD (PCP) or APP (Team Member)  Change PCP Type for MD, Specialty for APP is either Cardiology or Clinical Cardiac Electrophysiology  :[phone]}   Referring MD: [name], MD     History of Present Illness:    Vincent Velez is a 80 y.o. 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  Also has abdominal aortic aneurysm with EVAR 2010.  Persistent atrial fibrillation.  Today he is in flutter.  Underwent cardioversion in 2017 but returned quickly to A. fib.  Past Medical History:  Diagnosis Date  . AAA (abdominal aortic aneurysm) ([place])   . Anxiety   . Arthritis   . Asthma    when younger  . Atrial fibrillation ([place])   . CAD (coronary artery disease)   . Cataract    removed both  . COPD (chronic obstructive pulmonary disease) ([place])   . Diabetes mellitus    Type II  . Dysrhythmia    afib  . Esophageal reflux   . Family history of malignant neoplasm of gastrointestinal tract   . Heart murmur   . Hiatal hernia   . Hyperlipemia   . Hypertension   . Lung nodule    a. PET scan highly suspicious for malignancy. Bronch with biopsy to be done after TAVR  . Malignant neoplasm of prostate ([place])    prostate   . Peripheral vascular disease ([place])   . Pneumonia   . Stricture and stenosis of esophagus     Past Surgical History:  Procedure Laterality Date  . ABDOMINAL AORTA STENT    . CARDIAC CATHETERIZATION    . CARDIOVERSION N/A 12/04/2015   Procedure: CARDIOVERSION;  Surgeon: Sanda Klein, MD;  Location: [facility];  Service: Cardiovascular;  Laterality: N/A;  . COLONOSCOPY    . FINGER SURGERY Right     middle finger- amputation  . INSERTION PROSTATE RADIATION SEED    . MEDIASTINOSCOPY N/A 01/06/2019   Procedure: MEDIASTINOSCOPY WITH BIOPIES;  Surgeon: Gaye Pollack, MD;  Location: [facility];  Service: Thoracic;  Laterality: N/A;  . RIGHT/LEFT HEART CATH AND CORONARY ANGIOGRAPHY N/A 10/26/2018   Procedure: RIGHT/LEFT HEART CATH AND CORONARY ANGIOGRAPHY;  Surgeon: Burnell Blanks, MD;  Location: [facility];  Service: Cardiovascular;  Laterality: N/A;  . TEE WITHOUT CARDIOVERSION N/A 12/06/2018   Procedure: TRANSESOPHAGEAL ECHOCARDIOGRAM (TEE);  Surgeon: Burnell Blanks, MD;  Location: [facility];  Service: Open Heart Surgery;  Laterality: N/A;  . TRANSCATHETER AORTIC VALVE REPLACEMENT, TRANSFEMORAL N/A 12/06/2018   Procedure: TRANSCATHETER AORTIC VALVE REPLACEMENT, TRANSFEMORAL;  Surgeon: Burnell Blanks, MD;  Location: [facility];  Service: Open Heart Surgery;  Laterality: N/A;  . UPPER GASTROINTESTINAL ENDOSCOPY    . VIDEO ASSISTED THORACOSCOPY (VATS)/THOROCOTOMY Left 02/02/2019   Procedure: VIDEO ASSISTED THORACOSCOPY (VATS)/THOROCOTOMY;  Surgeon: Gaye Pollack, MD;  Location: [facility];  Service: Thoracic;  Laterality: Left;  Marland Kitchen VIDEO BRONCHOSCOPY N/A 01/06/2019   Procedure: VIDEO BRONCHOSCOPY;  Surgeon: Gaye Pollack, MD;  [facility]: [facility];  Service: Thoracic;  Laterality: N/A;  . WEDGE RESECTION Left 02/02/2019  Procedure: LUNG RESECTION;  Surgeon: Gaye Pollack, MD;  Location: [facility];  Service: Thoracic;  Laterality: Left;    Current Medications: Current Meds  Medication Sig  . acetaminophen (TYLENOL) 500 MG tablet Take 2 tablets (1,000 mg total) by mouth every 6 (six) hours as needed for mild pain or fever.  . diltiazem (CARDIZEM SR) 60 MG 12 hr capsule Take 1 capsule (60 mg total) by mouth every 12 (twelve) hours.  . dorzolamide-timolol (COSOPT) 22.3-6.8 MG/ML ophthalmic solution 1 drop 2 (two) times daily.  Marland Kitchen glimepiride (AMARYL) 2 MG tablet Take 2  mg by mouth daily.  Marland Kitchen JARDIANCE 10 MG TABS tablet Take 10 mg by mouth daily.  . metFORMIN (GLUCOPHAGE-XR) 500 MG 24 hr tablet Take 1,000 mg by mouth every evening.   . metoprolol succinate (TOPROL-XL) 25 MG 24 hr tablet Take 50 mg by mouth daily.  . Multiple Vitamins-Minerals (MULTIVITAMIN WITH MINERALS) tablet Take 1 tablet by mouth daily.  . Multiple Vitamins-Minerals (ZINC PO) Take 1 tablet by mouth daily.  . pantoprazole (PROTONIX) 40 MG tablet Take 40 mg by mouth daily.  . simvastatin (ZOCOR) 40 MG tablet Take 40 mg by mouth at bedtime.  Alveda Reasons 20 MG TABS tablet TAKE 1 TABLET BY MOUTH  DAILY WITH SUPPER     Allergies:   Macrolides and ketolides   Social History   Socioeconomic History  . Marital status: Married    Spouse name: Not on file  . Number of children: 2  . Years of education: Not on file  . Highest education level: Not on file  Occupational History  . Occupation: engineer-retired    Employer: [employer]  Tobacco Use  . Smoking status: Former Smoker    Types: Pipe, Cigars    Quit date: 12/2018    Years since quitting: 1.5  . Smokeless tobacco: Never Used  Vaping Use  . Vaping Use: Never used  Substance and Sexual Activity  . Alcohol use: Not Currently    Alcohol/week: 0.0 standard drinks    Comment: occ  . Drug use: No  . Sexual activity: Not on file  Other Topics Concern  . Not on file  Social History Narrative  . Not on file   Social Determinants of Health   Financial Resource Strain: Not on file  Food Insecurity: Not on file  Transportation Needs: Not on file  Physical Activity: Not on file  Stress: Not on file  Social Connections: Not on file     Family History: The patient's family history includes Colon cancer in his father; Deep vein thrombosis in his mother; Diabetes in his mother; Heart attack in his brother; Heart disease in his mother; Hyperlipidemia in his mother; Hypertension in his brother and mother; Prostate cancer in his brother;  Varicose Veins in his mother. There is no history of Colon polyps, Esophageal cancer, Rectal cancer, or Stomach cancer.  ROS:   Please see the history of present illness.     All other systems reviewed and are negative.  EKGs/Labs/Other Studies Reviewed:    The following studies were reviewed today:  Echo 2022  1. Left ventricular ejection fraction, by estimation, is 50 to 55%. The  left ventricle has low normal function. The left ventricle has no regional  wall motion abnormalities. There is mild concentric left ventricular  hypertrophy. Left ventricular  diastolic function could not be evaluated.  2. Right ventricular systolic function is normal. The right ventricular  size is normal. There is normal pulmonary artery  systolic pressure. The  estimated right ventricular systolic pressure is 96.7 mmHg.  3. Left atrial size was severely dilated.  4. Right atrial size was mild to moderately dilated.  5. The mitral valve is normal in structure. Trivial mitral valve  regurgitation. No evidence of mitral stenosis.  6. The aortic valve has been repaired/replaced. Aortic valve  regurgitation is mild to moderate. There is a 26 mm Sapien prosthetic  (TAVR) valve present in the aortic position. Procedure Date: 12/06/2018.  Aortic valve area, by VTI measures 1.26 cm. Aortic  valve mean gradient measures 9.6 mmHg.  7. Aortic dilatation noted. There is mild dilatation of the ascending  aorta, measuring 40 mm.  8. The inferior vena cava is normal in size with <50% respiratory  variability, suggesting right atrial pressure of 8 mmHg.   Comparison(s): No significant change from prior study.   EKG:  EKG is  ordered today.  The ekg ordered today demonstrates AFLUTTER 70 bpm typical flutter.  Recent Labs: 12/06/2019: BUN 14; Creatinine, Ser 0.82; Hemoglobin 12.3; Platelets 141; Potassium 4.1; Sodium 138; TSH 1.140  Recent Lipid Panel    Component Value Date/Time   CHOL 120 09/15/2013  1428   TRIG 110.0 09/15/2013 1428   HDL 37.70 (L) 09/15/2013 1428   CHOLHDL 3 09/15/2013 1428   VLDL 22.0 09/15/2013 1428   LDLCALC 60 09/15/2013 1428     Risk Assessment/Calculations:      Physical Exam:    VS:  BP 120/60 (BP Location: Left Arm, Patient Position: Sitting, Cuff Size: Normal)   Pulse 70   Ht 5\' 9"  (1.753 m)   Wt 173 lb (78.5 kg)   SpO2 98%   BMI 25.55 kg/m     Wt Readings from Last 3 Encounters:  07/24/20 173 lb (78.5 kg)  07/04/20 170 lb (77.1 kg)  02/28/20 176 lb (79.8 kg)     GEN:  Well nourished, well developed in no acute distress HEENT: Normal NECK: No JVD; No carotid bruits LYMPHATICS: No lymphadenopathy CARDIAC: irreg , 1/6 SM, no rubs, gallops RESPIRATORY:  Clear to auscultation without rales, wheezing or rhonchi  ABDOMEN: Soft, non-tender, non-distended MUSCULOSKELETAL:  No edema; No deformity  SKIN: Warm and dry NEUROLOGIC:  Alert and oriented x 3 PSYCHIATRIC:  Normal affect   ASSESSMENT:    1. Persistent atrial fibrillation   2. Essential hypertension   3. S/P TAVR (transcatheter aortic valve replacement)   4. Aortic valve insufficiency, etiology of cardiac valve disease unspecified   5. AAA (abdominal aortic aneurysm) without rupture ([facility])    PLAN:    In order of problems listed above:  TAVR -Mild to moderate perivalvular leak.  Stable.  We will monitor.  Atrial flutter/fibrillation -On Xarelto -Toprol and Cardizem for rate control  EVAR 2010 -Stable.  No changes.  Chronic anticoagulation -On Xarelto.  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  Stable.  Dr. Cyndia Bent is been monitoring.  Hyperlipidemia -Doing well on simvastatin.  No changes made.  Last LDL 71.      Medication Adjustments/Labs and Tests Ordered: Current medicines are reviewed at length with the patient today.  Concerns regarding medicines are outlined above.  Orders Placed This  Encounter  Procedures  . EKG 12-Lead   No orders of the defined types were placed in this encounter.   Patient Instructions  Medication Instructions:   Your physician recommends that you continue on your current medications as directed. Please refer to the  Current Medication list given to you today.  *If you need a refill on your cardiac medications before your next appointment, please call your pharmacy*   Follow-Up: At [facility], you and your health needs are our priority.  As part of our continuing mission to provide you with exceptional heart care, we have created designated Provider Care Teams.  These Care Teams include your primary Cardiologist (physician) and Advanced Practice Providers (APPs -  Physician Assistants and Nurse Practitioners) who all work together to provide you with the care you need, when you need it.  We recommend signing up for the patient portal called "MyChart".  Sign up information is provided on this After Visit Summary.  MyChart is used to connect with patients for Virtual Visits (Telemedicine).  Patients are able to view lab/test results, encounter notes, upcoming appointments, etc.  Non-urgent messages can be sent to your provider as well.   To learn more about what you can do with MyChart, go to [email or web address].    Your next appointment:   6 month(s)  The format for your next appointment:   In Person  Provider:   Candee Furbish, MD       Signed, Candee Furbish, MD  07/24/2020 3:39 PM    [place]

## 2020-07-24 NOTE — Patient Instructions (Signed)
Medication Instructions:   Your physician recommends that you continue on your current medications as directed. Please refer to the Current Medication list given to you today.  *If you need a refill on your cardiac medications before your next appointment, please call your pharmacy*   Follow-Up: At [facility], you and your health needs are our priority.  As part of our continuing mission to provide you with exceptional heart care, we have created designated Provider Care Teams.  These Care Teams include your primary Cardiologist (physician) and Advanced Practice Providers (APPs -  Physician Assistants and Nurse Practitioners) who all work together to provide you with the care you need, when you need it.  We recommend signing up for the patient portal called "MyChart".  Sign up information is provided on this After Visit Summary.  MyChart is used to connect with patients for Virtual Visits (Telemedicine).  Patients are able to view lab/test results, encounter notes, upcoming appointments, etc.  Non-urgent messages can be sent to your provider as well.   To learn more about what you can do with MyChart, go to [email or web address].    Your next appointment:   6 month(s)  The format for your next appointment:   In Person  Provider:   Candee Furbish, MD

## 2020-08-07 ENCOUNTER — Encounter: Payer: Self-pay | Admitting: Surgery

## 2020-08-07 ENCOUNTER — Other Ambulatory Visit: Payer: Self-pay

## 2020-08-07 ENCOUNTER — Ambulatory Visit (INDEPENDENT_AMBULATORY_CARE_PROVIDER_SITE_OTHER): Payer: Medicare Other | Admitting: Surgery

## 2020-08-07 ENCOUNTER — Ambulatory Visit
Admission: RE | Admit: 2020-08-07 | Discharge: 2020-08-07 | Disposition: A | Discharge status: Home / Self Care | Payer: Medicare Other | Source: Ambulatory Visit | Attending: Surgery | Admitting: Surgery

## 2020-08-07 VITALS — BP 122/73 | HR 88 | Temp 97.6°F | Resp 20 | Ht 69.0 in | Wt 173.0 lb

## 2020-08-07 DIAGNOSIS — Z85118 Personal history of other malignant neoplasm of bronchus and lung: Secondary | ICD-10-CM

## 2020-08-07 DIAGNOSIS — R918 Other nonspecific abnormal finding of lung field: Secondary | ICD-10-CM | POA: Diagnosis not present

## 2020-08-07 DIAGNOSIS — C3490 Malignant neoplasm of unspecified part of unspecified bronchus or lung: Secondary | ICD-10-CM

## 2020-08-07 IMAGING — CT CT CHEST W/O CM
2 of 4 series · 13 of 36 positions shown, 16 images · non-contrast
Comparison: [DATE].

CLINICAL DATA: Small-cell lung cancer.  Restaging.

EXAM:
CT CHEST WITHOUT CONTRAST
TECHNIQUE: Multidetector CT imaging of the chest was performed following the
standard protocol without IV contrast.

[Series 2: chest 2.00 br40 s3 · axial · 0.60mm/px · z∈[+1433,+1697]mm · 10 of 157 slices shown, 13 images (1 of 2)]
[im 13/157  mediastinal]
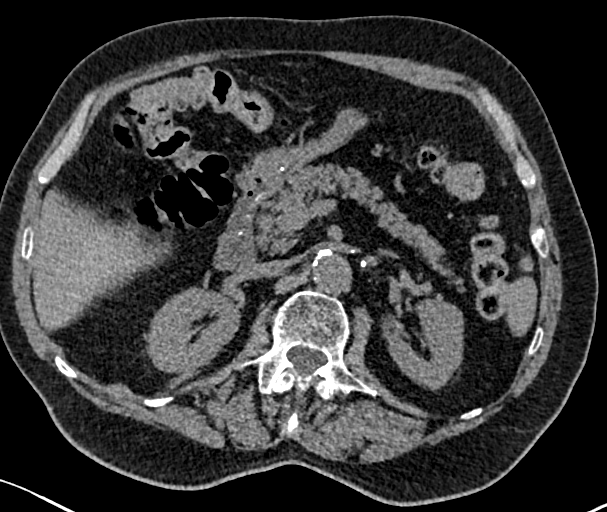
[im 13/157  lung]
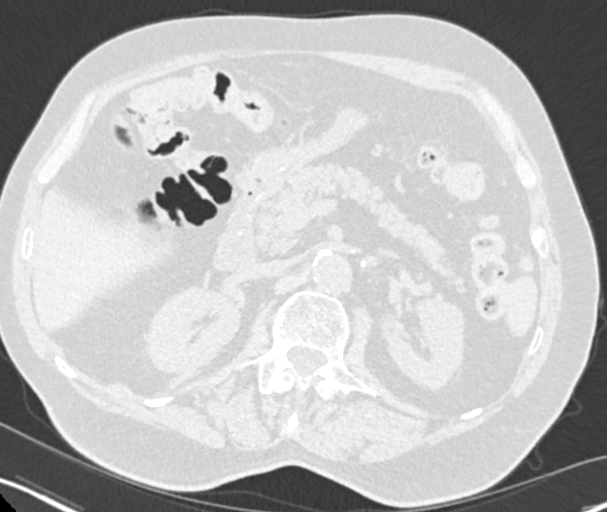
[im 25/157  lung]
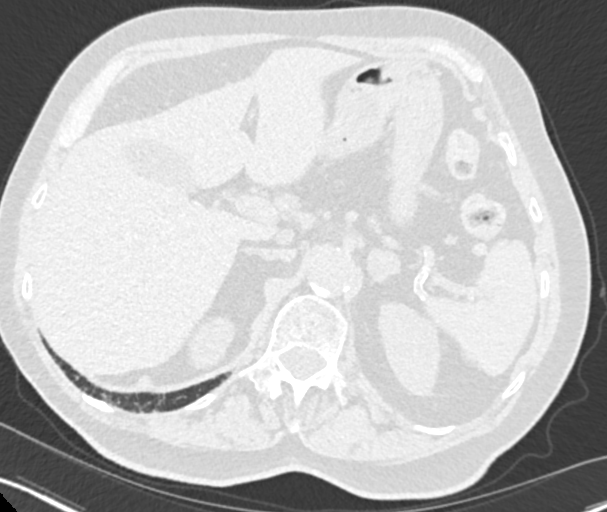
[im 49/157  lung]
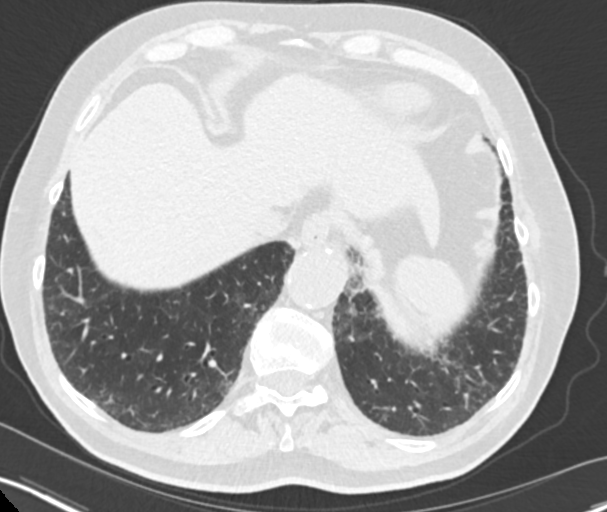
[im 61/157  lung]
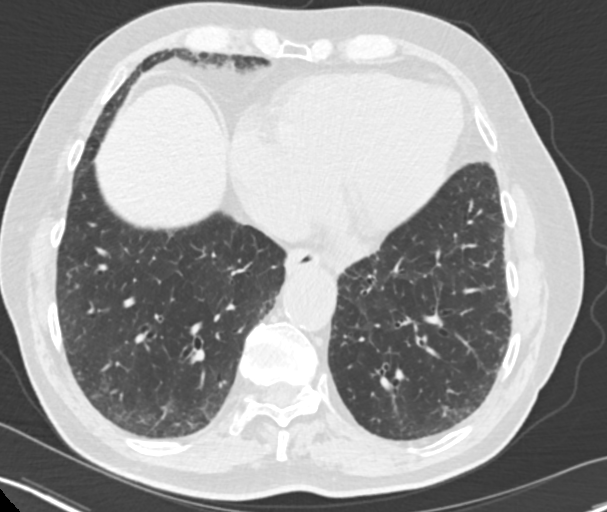
[im 73/157  mediastinal]
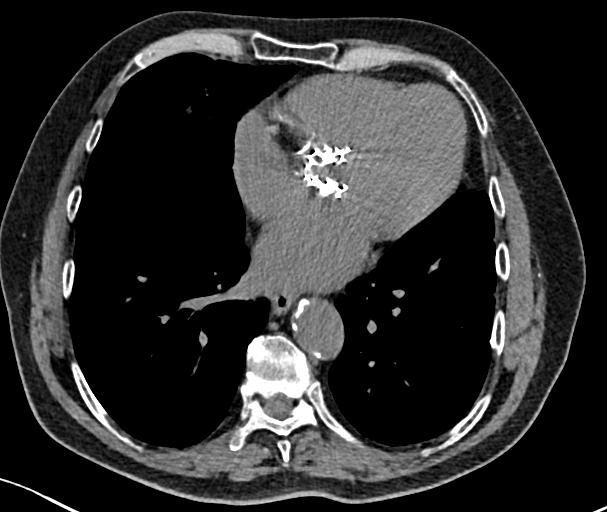
[im 73/157  lung]
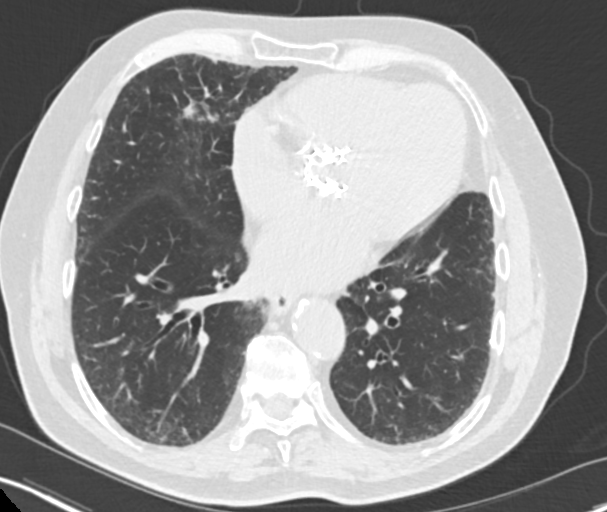
[im 85/157  lung]
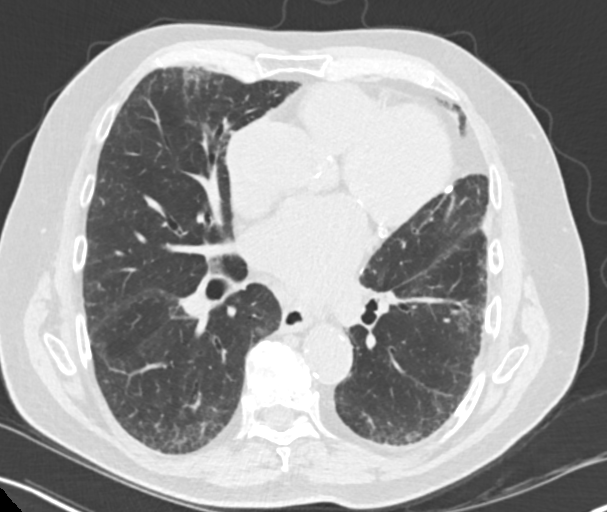
[im 97/157  lung]
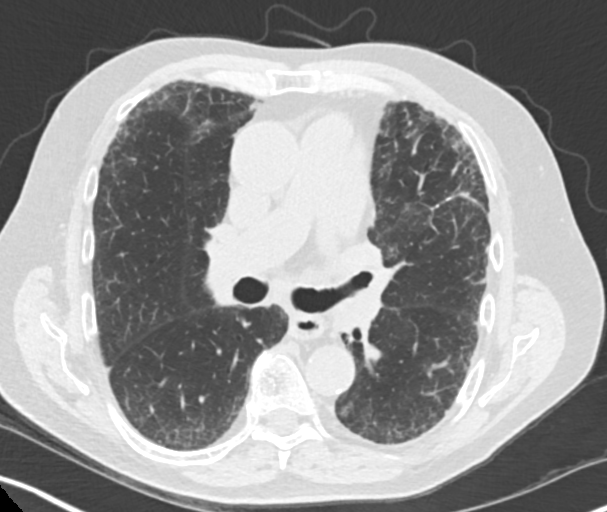
[im 121/157  lung]
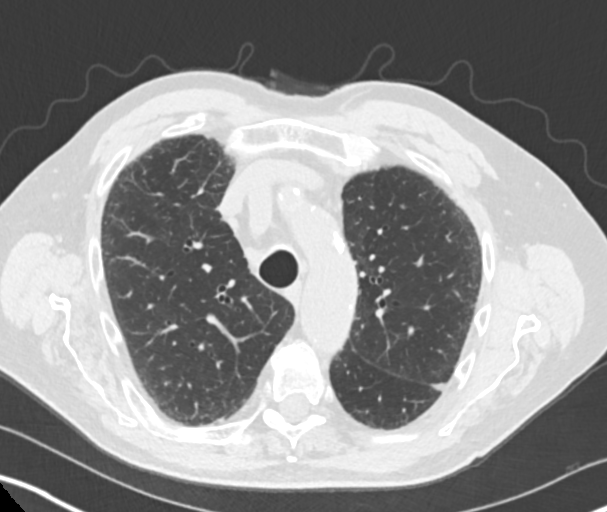
[im 133/157  mediastinal]
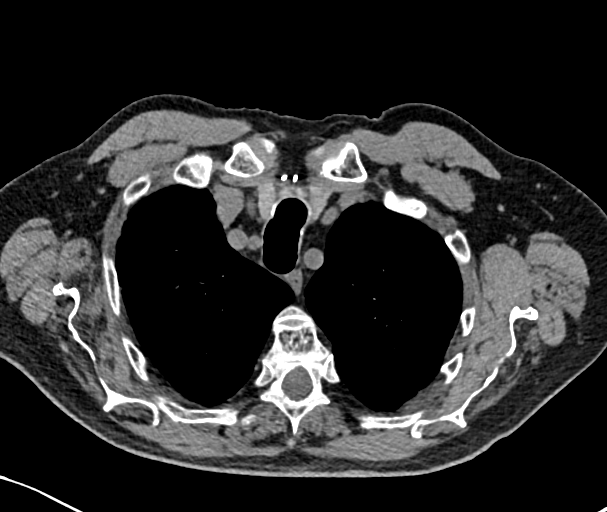
[im 133/157  lung]
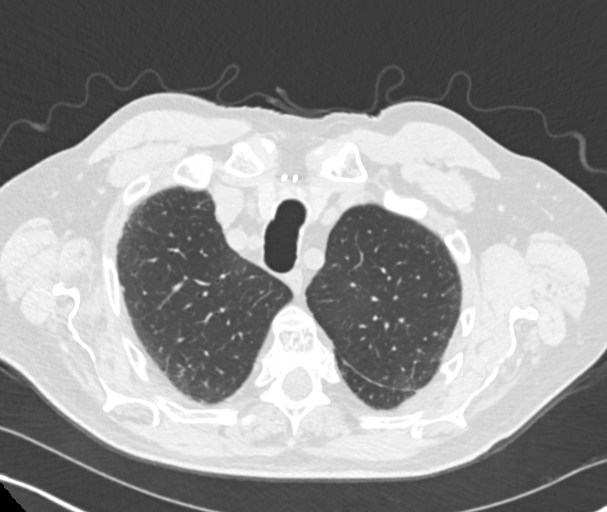
[im 145/157  lung]
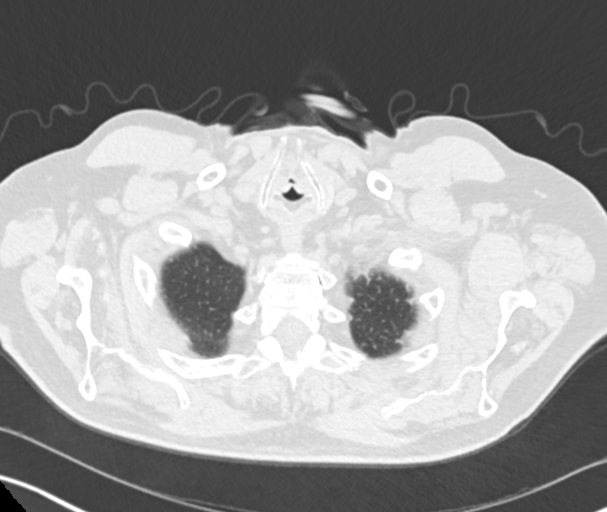

[Series 4: chest 2.00 br40 s3 · coronal · 0.61mm/px · 3 of 153 slices shown (2 of 2)]
[im 31/153  lung]
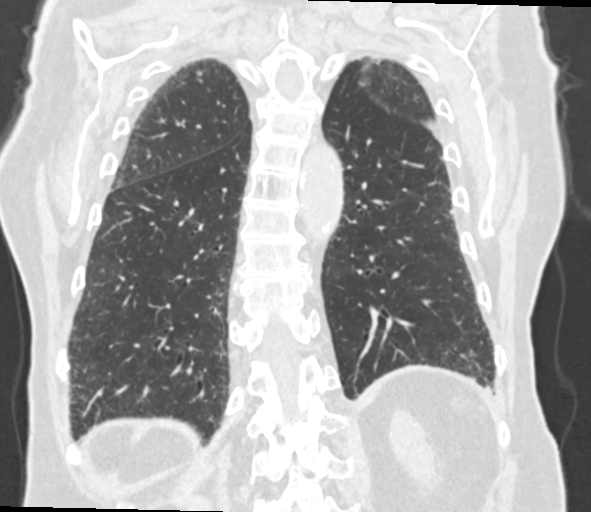
[im 61/153  lung]
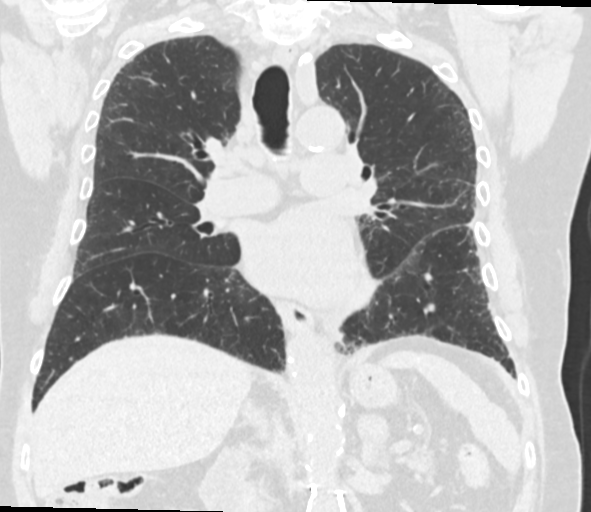
[im 92/153  lung]
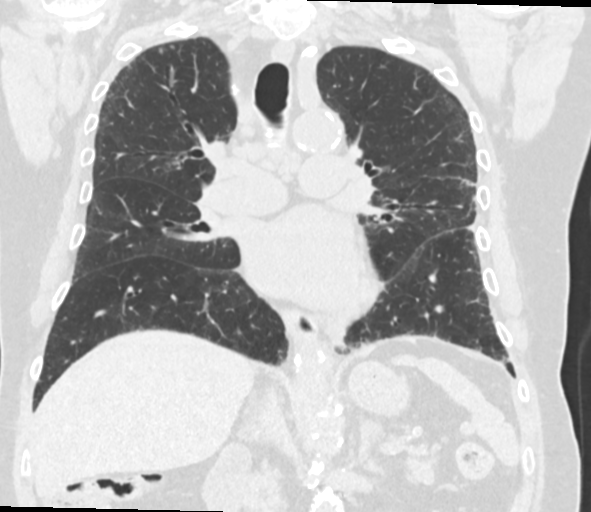

[13 of 36 positions shown; findings below may reference images not displayed]

FINDINGS: Cardiovascular: The heart size is normal. No substantial pericardial
effusion. Coronary artery calcification is evident. Atherosclerotic
calcification is noted in the wall of the thoracic aorta. Status
post aortic valve replacement. Ascending thoracic aorta measures up
to 4.4 cm diameter today.

Mediastinum/Nodes: 12 mm subcarinal lymph node is borderline
enlarged but stable in the interval. Similar appearance of 13 mm
short axis AP window node on 48/2. Other scattered upper normal
mediastinal lymph nodes evident. No evidence for gross hilar
lymphadenopathy although assessment is limited by the lack of
intravenous contrast on today's study. The esophagus has normal
imaging features. There is no axillary lymphadenopathy.

Lungs/Pleura: Surgical changes noted left upper lobe. Subpleural
reticulation again noted. Scattered areas of architectural
distortion and scarring again noted. 3 mm posterior right upper lobe
nodule on [DATE] is unchanged. 5 mm right upper lobe nodule on 46/8 is
stable. 4 mm nodule in the lingula shows no substantial change
(70/8). No new suspicious nodule or mass. No focal airspace
consolidation. There is no evidence of pleural effusion.

Upper Abdomen: Stable 19 mm left adrenal nodule

Musculoskeletal: No worrisome lytic or sclerotic osseous
abnormality.
IMPRESSION: 1. Stable exam. No new or progressive findings to suggest recurrent
or metastatic disease.
2. Tiny bilateral pulmonary nodules are stable in the interval.
3. Stable borderline to mild mediastinal lymphadenopathy.
4. Subpleural reticulation in the lungs suggests underlying fibrotic
lung disease.
5. Stable 19 mm left adrenal nodule.
6. Ascending thoracic aorta measures 4.4 cm on today's exam.
Recommend annual imaging followup by CTA or MRA. This recommendation
follows [VH] ACCF/AHA/AATS/ACR/ASA/SCA/RGAT/RGAT/RGAT/RGAT Guidelines
for the Diagnosis and Management of Patients with Thoracic Aortic
Disease. Circulation. [VH]; 121: [id number]. Aortic aneurysm NOS
([VH]-[VH])
7. Aortic Atherosclerosis ([VH]-[VH]).

## 2020-08-07 NOTE — Progress Notes (Signed)
HPI:  Patient returns for routine postoperativesurveillancehaving undergoneleft muscle-sparing thoracotomy with wedge resection of a left upper lobe lung masson 02/02/2019. 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  He denies any cough or sputum production.  He has continued to abstain from smoking.  He has not getting out to walk much.  He does feel like he gets more fatigued than he used to.  He had a follow-up echocardiogram on 07/17/2020 which showed the 26 mm SAPIEN 3 TAVR valve in the aortic position.  The mean gradient was 9.6 mmHg which was decreased compared to his echo in July when it was 17.6 mmHg.  There is still visually mild paravalvular regurgitation in 1 spot with an AI pressure half-time of 507 ms.  Left ventricular ejection fraction is 50 to 55%.  Current Outpatient Medications  Medication Sig Dispense Refill  . acetaminophen (TYLENOL) 500 MG tablet Take 2 tablets (1,000 mg total) by mouth every 6 (six) hours as needed for mild pain or fever. 30 tablet 0  . diltiazem (CARDIZEM SR) 60 MG 12 hr capsule Take 1 capsule (60 mg total) by mouth every 12 (twelve) hours. 60 capsule 7  . dorzolamide-timolol (COSOPT) 22.3-6.8 MG/ML ophthalmic solution 1 drop 2 (two) times daily.    Marland Kitchen glimepiride (AMARYL) 2 MG tablet Take 2 mg by mouth daily.    Marland Kitchen JARDIANCE 10 MG TABS tablet Take 10 mg by mouth daily.    . metFORMIN (GLUCOPHAGE-XR) 500 MG 24 hr tablet Take 1,000 mg by mouth every evening.     . metoprolol succinate (TOPROL-XL) 25 MG 24 hr tablet Take 50 mg by mouth daily.    . Multiple Vitamins-Minerals (MULTIVITAMIN WITH MINERALS) tablet Take 1 tablet by mouth daily.    . Multiple Vitamins-Minerals (ZINC PO) Take 1 tablet by mouth daily.    . pantoprazole (PROTONIX) 40 MG tablet Take 40 mg by mouth daily.    . simvastatin (ZOCOR) 40 MG tablet Take 40 mg by mouth at  bedtime.    Alveda Reasons 20 MG TABS tablet TAKE 1 TABLET BY MOUTH  DAILY WITH SUPPER 90 tablet 1   No current facility-administered medications for this visit.     Physical Exam: BP 122/73   Pulse 88   Temp 97.6 F (36.4 C) (Skin)   Resp 20   Ht 5\' 9"  (1.753 m)   Wt 173 lb (78.5 kg)   SpO2 97% Comment: RA  BMI 25.55 kg/m  He looks well. There is no cervical or supraclavicular adenopathy. Cardiac exam shows regular rate and rhythm with normal heart sounds.  There is no murmur. Lungs are clear. There is no peripheral edema.  Diagnostic Tests:  ECHOCARDIOGRAM REPORT       Patient Name:  Vincent Velez Date of Exam: 07/17/2020  Medical Rec #: [id number]    Height:    69.0 in  Accession #:  [id number]    Weight:    170.0 lb  Date of Birth: 22-Oct-1940    BSA:     1.928 m  Patient Age:  80 years     BP:      129/84 mmHg  Patient Gender: M        HR:      85 bpm.  Exam Location: [address]   Procedure: 2D Echo, Cardiac Doppler and Color Doppler   Indications:  Z95.2 S/p TAVR    History:  Patient has prior history of Echocardiogram examinations,  most         recent 12/06/2019. COPD, S/p TAVR (27mm Edwards Sapien);         Arrythmias:Atrial Fibrillation.         Aortic Valve: 26 mm Sapien prosthetic, stented (TAVR)  valve is         present in the aortic position. Procedure Date: 12/06/2018.    Sonographer:  Coralyn Helling RDCS  Referring Phys: [id number] [place]    1. Left ventricular ejection fraction, by estimation, is 50 to 55%. The  left ventricle has low normal function. The left ventricle has no regional  wall motion abnormalities. There is mild concentric left ventricular  hypertrophy. Left ventricular  diastolic function could not be evaluated.  2. Right ventricular systolic function is normal. The right ventricular  size is normal.  There is normal pulmonary artery systolic pressure. The  estimated right ventricular systolic pressure is 25.6 mmHg.  3. Left atrial size was severely dilated.  4. Right atrial size was mild to moderately dilated.  5. The mitral valve is normal in structure. Trivial mitral valve  regurgitation. No evidence of mitral stenosis.  6. The aortic valve has been repaired/replaced. Aortic valve  regurgitation is mild to moderate. There is a 26 mm Sapien prosthetic  (TAVR) valve present in the aortic position. Procedure Date: 12/06/2018.  Aortic valve area, by VTI measures 1.26 cm. Aortic  valve mean gradient measures 9.6 mmHg.  7. Aortic dilatation noted. There is mild dilatation of the ascending  aorta, measuring 40 mm.  8. The inferior vena cava is normal in size with <50% respiratory  variability, suggesting right atrial pressure of 8 mmHg.   Comparison(s): No significant change from prior study.   FINDINGS  Left Ventricle: In atrial flutter, diastolic function cannot be assessed.  Left ventricular ejection fraction, by estimation, is 50 to 55%. The left  ventricle has low normal function. The left ventricle has no regional wall  motion abnormalities. The left  ventricular internal cavity size was normal in size. There is mild  concentric left ventricular hypertrophy. Left ventricular diastolic  function could not be evaluated.   Right Ventricle: The right ventricular size is normal. No increase in  right ventricular wall thickness. Right ventricular systolic function is  normal. There is normal pulmonary artery systolic pressure. The tricuspid  regurgitant velocity is 2.00 m/s, and  with an assumed right atrial pressure of 8 mmHg, the estimated right  ventricular systolic pressure is 38.9 mmHg.   Left Atrium: Left atrial size was severely dilated.   Right Atrium: Right atrial size was mild to moderately dilated.   Pericardium: There is no evidence of pericardial effusion.    Mitral Valve: The mitral valve is normal in structure. Trivial mitral  valve regurgitation. No evidence of mitral valve stenosis.   Tricuspid Valve: The tricuspid valve is normal in structure. Tricuspid  valve regurgitation is trivial.   Aortic Valve: Perivalvular leak is mild-moderate, similar to prior. In  parasternal short, noted at approximately 1 o'clock on the aortic annulus.  The aortic valve has been repaired/replaced. Aortic valve regurgitation is  mild to moderate. Aortic  regurgitation PHT measures 507 msec. Aortic valve mean gradient measures  9.6 mmHg. Aortic valve peak gradient measures 18.6 mmHg. Aortic valve  area, by VTI measures 1.26 cm. There is a 26 mm Sapien prosthetic,  stented (TAVR) valve present in the aortic  position. Procedure Date: 12/06/2018.  Pulmonic Valve: The pulmonic valve was not well visualized. Pulmonic valve  regurgitation is not visualized. No evidence of pulmonic stenosis.   Aorta: Aortic dilatation noted. There is mild dilatation of the ascending  aorta, measuring 40 mm.   Venous: The inferior vena cava is normal in size with less than 50%  respiratory variability, suggesting right atrial pressure of 8 mmHg.   IAS/Shunts: The atrial septum is grossly normal.     LEFT VENTRICLE  PLAX 2D  LVIDd:     5.00 cm Diastology  LVIDs:     3.60 cm LV e' medial:  6.31 cm/s  LV PW:     1.20 cm LV E/e' medial: 14.5  LV IVS:    1.30 cm LV e' lateral:  10.80 cm/s  LVOT diam:   2.20 cm LV E/e' lateral: 8.5  LV SV:     54  LV SV Index:  28  LVOT Area:   3.80 cm     RIGHT VENTRICLE       IVC  RV S prime:   10.60 cm/s IVC diam: 1.10 cm  TAPSE (M-mode): 1.6 cm  RVSP:      19.0 mmHg   LEFT ATRIUM       Index    RIGHT ATRIUM      Index  LA diam:    4.50 cm 2.33 cm/m RA Pressure: 3.00 mmHg  LA Vol (A2C):  83.7 ml 43.41 ml/m RA Area:   16.90 cm  LA Vol (A4C):  113.0 ml  58.61 ml/m RA Volume:  47.50 ml 24.64 ml/m  LA Biplane Vol: 104.0 ml 53.94 ml/m  AORTIC VALVE  AV Area (Vmax):  1.13 cm  AV Area (Vmean):  1.19 cm  AV Area (VTI):   1.26 cm  AV Vmax:      215.80 cm/s  AV Vmean:     145.400 cm/s  AV VTI:      0.426 m  AV Peak Grad:   18.6 mmHg  AV Mean Grad:   9.6 mmHg  LVOT Vmax:     63.98 cm/s  LVOT Vmean:    45.520 cm/s  LVOT VTI:     0.142 m  LVOT/AV VTI ratio: 0.33  AI PHT:      507 msec    AORTA  Ao Root diam: 3.10 cm  Ao Asc diam: 4.00 cm   MV E velocity: 91.30 cm/s TRICUSPID VALVE  MV A velocity: 51.40 cm/s TR Peak grad:  16.0 mmHg  MV E/A ratio: 1.78    TR Vmax:    200.00 cm/s               Estimated RAP: 3.00 mmHg               RVSP:      19.0 mmHg                 SHUNTS               Systemic VTI: 0.14 m               Systemic Diam: 2.20 cm   Buford Dresser MD  Electronically signed by Buford Dresser MD  Signature Date/Time: 07/17/2020/5:18:49 PM      Narrative & Impression  CLINICAL DATA:  Small-cell lung cancer.  Restaging.  EXAM: CT CHEST WITHOUT CONTRAST  TECHNIQUE: Multidetector CT imaging of the chest was performed following the standard protocol without IV contrast.  COMPARISON:  02/28/2020.  FINDINGS: Cardiovascular: The  heart size is normal. No substantial pericardial effusion. Coronary artery calcification is evident. Atherosclerotic calcification is noted in the wall of the thoracic aorta. Status post aortic valve replacement. Ascending thoracic aorta measures up to 4.4 cm diameter today.  Mediastinum/Nodes: 12 mm subcarinal lymph node is borderline enlarged but stable in the interval. Similar appearance of 13 mm short axis AP window node on 48/2. Other scattered upper normal mediastinal lymph nodes evident. No evidence for gross  hilar lymphadenopathy although assessment is limited by the lack of intravenous contrast on today's study. The esophagus has normal imaging features. There is no axillary lymphadenopathy.  Lungs/Pleura: Surgical changes noted left upper lobe. Subpleural reticulation again noted. Scattered areas of architectural distortion and scarring again noted. 3 mm posterior right upper lobe nodule on 23/8 is unchanged. 5 mm right upper lobe nodule on 46/8 is stable. 4 mm nodule in the lingula shows no substantial change (70/8). No new suspicious nodule or mass. No focal airspace consolidation. There is no evidence of pleural effusion.  Upper Abdomen: Stable 19 mm left adrenal nodule  Musculoskeletal: No worrisome lytic or sclerotic osseous abnormality.  IMPRESSION: 1. Stable exam. No new or progressive findings to suggest recurrent or metastatic disease. 2. Tiny bilateral pulmonary nodules are stable in the interval. 3. Stable borderline to mild mediastinal lymphadenopathy. 4. Subpleural reticulation in the lungs suggests underlying fibrotic lung disease. 5. Stable 19 mm left adrenal nodule. 6. Ascending thoracic aorta measures 4.4 cm on today's exam. Recommend annual imaging followup by CTA or MRA. This recommendation follows 2010 ACCF/AHA/AATS/ACR/ASA/SCA/SCAI/SIR/STS/SVM Guidelines for the Diagnosis and Management of Patients with Thoracic Aortic Disease. Circulation. 2010; 121: B638-L373. Aortic aneurysm NOS (ICD10-I71.9) 7. Aortic Atherosclerosis (ICD10-I70.0).   Electronically Signed   By: Misty Stanley M.D.   On: 08/07/2020 11:10      Impression:  His CT scan of the chest shows no evidence of recurrent or metastatic lung cancer.  Recent 2D echocardiogram shows a stable TAVR valve with no change in the mild perivalvular leak and a low mean gradient.  I encouraged him to increase his walking which help and build up stamina and improve his energy level.  Plan:  He will  return to see me in 6 months with a CT scan of the chest for lung cancer surveillance.  I spent 20 minutes performing this established patient evaluation and > 50% of this time was spent face to face counseling and coordinating the surveillance of his previously resected lung cancer.   Gaye Pollack, MD [facility] [phone]

## 2020-09-06 DIAGNOSIS — K219 Gastro-esophageal reflux disease without esophagitis: Secondary | ICD-10-CM | POA: Diagnosis not present

## 2020-09-06 DIAGNOSIS — I480 Paroxysmal atrial fibrillation: Secondary | ICD-10-CM | POA: Diagnosis not present

## 2020-09-06 DIAGNOSIS — H409 Unspecified glaucoma: Secondary | ICD-10-CM | POA: Diagnosis not present

## 2020-09-06 DIAGNOSIS — I10 Essential (primary) hypertension: Secondary | ICD-10-CM | POA: Diagnosis not present

## 2020-09-06 DIAGNOSIS — E139 Other specified diabetes mellitus without complications: Secondary | ICD-10-CM | POA: Diagnosis not present

## 2020-09-06 DIAGNOSIS — C61 Malignant neoplasm of prostate: Secondary | ICD-10-CM | POA: Diagnosis not present

## 2020-09-06 DIAGNOSIS — Z85118 Personal history of other malignant neoplasm of bronchus and lung: Secondary | ICD-10-CM | POA: Diagnosis not present

## 2020-09-06 DIAGNOSIS — E7800 Pure hypercholesterolemia, unspecified: Secondary | ICD-10-CM | POA: Diagnosis not present

## 2020-09-06 DIAGNOSIS — E1169 Type 2 diabetes mellitus with other specified complication: Secondary | ICD-10-CM | POA: Diagnosis not present

## 2020-09-06 DIAGNOSIS — E119 Type 2 diabetes mellitus without complications: Secondary | ICD-10-CM | POA: Diagnosis not present

## 2020-11-06 DIAGNOSIS — E1169 Type 2 diabetes mellitus with other specified complication: Secondary | ICD-10-CM | POA: Diagnosis not present

## 2020-11-06 DIAGNOSIS — H409 Unspecified glaucoma: Secondary | ICD-10-CM | POA: Diagnosis not present

## 2020-11-06 DIAGNOSIS — I10 Essential (primary) hypertension: Secondary | ICD-10-CM | POA: Diagnosis not present

## 2020-11-06 DIAGNOSIS — E139 Other specified diabetes mellitus without complications: Secondary | ICD-10-CM | POA: Diagnosis not present

## 2020-11-06 DIAGNOSIS — K219 Gastro-esophageal reflux disease without esophagitis: Secondary | ICD-10-CM | POA: Diagnosis not present

## 2020-11-06 DIAGNOSIS — C61 Malignant neoplasm of prostate: Secondary | ICD-10-CM | POA: Diagnosis not present

## 2020-11-06 DIAGNOSIS — E7800 Pure hypercholesterolemia, unspecified: Secondary | ICD-10-CM | POA: Diagnosis not present

## 2020-11-06 DIAGNOSIS — E119 Type 2 diabetes mellitus without complications: Secondary | ICD-10-CM | POA: Diagnosis not present

## 2020-11-06 DIAGNOSIS — I480 Paroxysmal atrial fibrillation: Secondary | ICD-10-CM | POA: Diagnosis not present

## 2020-11-07 DIAGNOSIS — Z7984 Long term (current) use of oral hypoglycemic drugs: Secondary | ICD-10-CM | POA: Diagnosis not present

## 2020-11-07 DIAGNOSIS — E1169 Type 2 diabetes mellitus with other specified complication: Secondary | ICD-10-CM | POA: Diagnosis not present

## 2020-12-31 ENCOUNTER — Other Ambulatory Visit: Payer: Self-pay | Admitting: Surgery

## 2020-12-31 DIAGNOSIS — C3490 Malignant neoplasm of unspecified part of unspecified bronchus or lung: Secondary | ICD-10-CM

## 2021-01-06 DIAGNOSIS — E1169 Type 2 diabetes mellitus with other specified complication: Secondary | ICD-10-CM | POA: Diagnosis not present

## 2021-01-06 DIAGNOSIS — E7800 Pure hypercholesterolemia, unspecified: Secondary | ICD-10-CM | POA: Diagnosis not present

## 2021-01-06 DIAGNOSIS — Z9889 Other specified postprocedural states: Secondary | ICD-10-CM | POA: Diagnosis not present

## 2021-01-06 DIAGNOSIS — Z8679 Personal history of other diseases of the circulatory system: Secondary | ICD-10-CM | POA: Diagnosis not present

## 2021-01-06 DIAGNOSIS — Z0000 Encounter for general adult medical examination without abnormal findings: Secondary | ICD-10-CM | POA: Diagnosis not present

## 2021-01-06 DIAGNOSIS — Z7984 Long term (current) use of oral hypoglycemic drugs: Secondary | ICD-10-CM | POA: Diagnosis not present

## 2021-01-06 DIAGNOSIS — Z952 Presence of prosthetic heart valve: Secondary | ICD-10-CM | POA: Diagnosis not present

## 2021-01-06 DIAGNOSIS — I480 Paroxysmal atrial fibrillation: Secondary | ICD-10-CM | POA: Diagnosis not present

## 2021-01-06 DIAGNOSIS — H409 Unspecified glaucoma: Secondary | ICD-10-CM | POA: Diagnosis not present

## 2021-01-06 DIAGNOSIS — E1165 Type 2 diabetes mellitus with hyperglycemia: Secondary | ICD-10-CM | POA: Diagnosis not present

## 2021-01-06 DIAGNOSIS — Z85118 Personal history of other malignant neoplasm of bronchus and lung: Secondary | ICD-10-CM | POA: Diagnosis not present

## 2021-01-06 DIAGNOSIS — Z23 Encounter for immunization: Secondary | ICD-10-CM | POA: Diagnosis not present

## 2021-01-06 DIAGNOSIS — D696 Thrombocytopenia, unspecified: Secondary | ICD-10-CM | POA: Diagnosis not present

## 2021-01-06 DIAGNOSIS — I10 Essential (primary) hypertension: Secondary | ICD-10-CM | POA: Diagnosis not present

## 2021-01-07 DIAGNOSIS — H401132 Primary open-angle glaucoma, bilateral, moderate stage: Secondary | ICD-10-CM | POA: Diagnosis not present

## 2021-01-07 DIAGNOSIS — E119 Type 2 diabetes mellitus without complications: Secondary | ICD-10-CM | POA: Diagnosis not present

## 2021-01-21 DIAGNOSIS — D696 Thrombocytopenia, unspecified: Secondary | ICD-10-CM | POA: Diagnosis not present

## 2021-02-11 ENCOUNTER — Ambulatory Visit: Payer: Medicare Other | Admitting: Thoracic Surgery (Cardiothoracic Vascular Surgery)

## 2021-02-12 ENCOUNTER — Other Ambulatory Visit: Payer: Self-pay | Admitting: *Deleted

## 2021-02-12 ENCOUNTER — Other Ambulatory Visit: Payer: Self-pay

## 2021-02-12 ENCOUNTER — Encounter: Payer: Self-pay | Admitting: Surgery

## 2021-02-12 ENCOUNTER — Ambulatory Visit (INDEPENDENT_AMBULATORY_CARE_PROVIDER_SITE_OTHER): Payer: Medicare Other | Admitting: Surgery

## 2021-02-12 ENCOUNTER — Ambulatory Visit
Admission: RE | Admit: 2021-02-12 | Discharge: 2021-02-12 | Disposition: A | Discharge status: Home / Self Care | Payer: Medicare Other | Source: Ambulatory Visit | Attending: Surgery | Admitting: Surgery

## 2021-02-12 VITALS — BP 135/89 | HR 81 | Resp 20 | Wt 171.0 lb

## 2021-02-12 DIAGNOSIS — Z85118 Personal history of other malignant neoplasm of bronchus and lung: Secondary | ICD-10-CM | POA: Diagnosis not present

## 2021-02-12 DIAGNOSIS — C3490 Malignant neoplasm of unspecified part of unspecified bronchus or lung: Secondary | ICD-10-CM

## 2021-02-12 DIAGNOSIS — I700 Atherosclerosis of aorta: Secondary | ICD-10-CM | POA: Diagnosis not present

## 2021-02-12 DIAGNOSIS — R911 Solitary pulmonary nodule: Secondary | ICD-10-CM | POA: Diagnosis not present

## 2021-02-12 DIAGNOSIS — R918 Other nonspecific abnormal finding of lung field: Secondary | ICD-10-CM | POA: Diagnosis not present

## 2021-02-12 DIAGNOSIS — J439 Emphysema, unspecified: Secondary | ICD-10-CM | POA: Diagnosis not present

## 2021-02-12 IMAGING — CT CT CHEST W/O CM
2 of 4 series · 15 of 36 positions shown, 18 images · non-contrast
Comparison: Multiple prior CT scans.  The most recent is [DATE]

CLINICAL DATA: Followup pulmonary nodules. History of prior lung
cancer status post wedge resection of a left upper lobe lung mass in
[YB].

EXAM:
CT CHEST WITHOUT CONTRAST
TECHNIQUE: Multidetector CT imaging of the chest was performed following the
standard protocol without IV contrast.

[Series 2: chest 2.00 br40 s3 · axial · 0.73mm/px · z∈[+1518,+1772]mm · 12 of 151 slices shown, 15 images (1 of 2)]
[im 12/151  mediastinal]
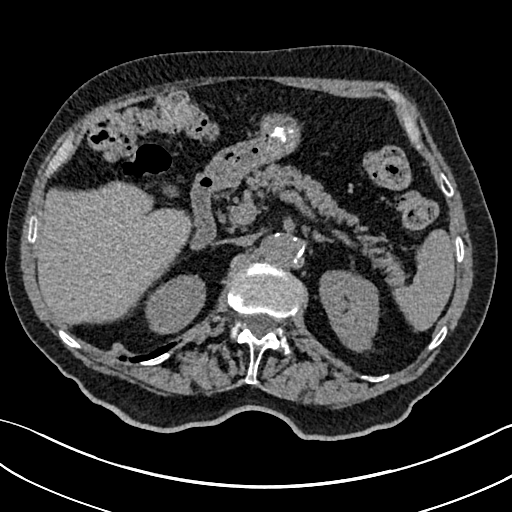
[im 12/151  lung]
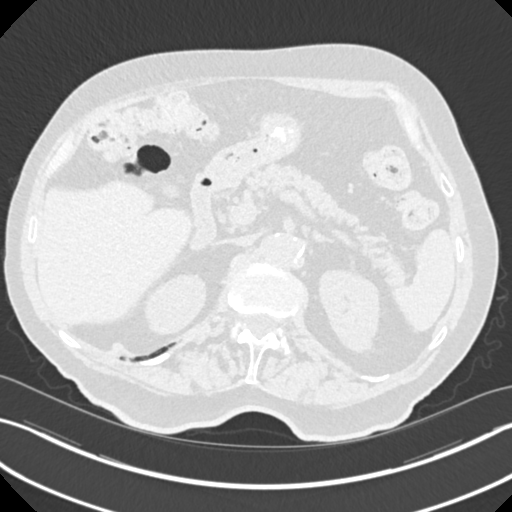
[im 24/151  lung]
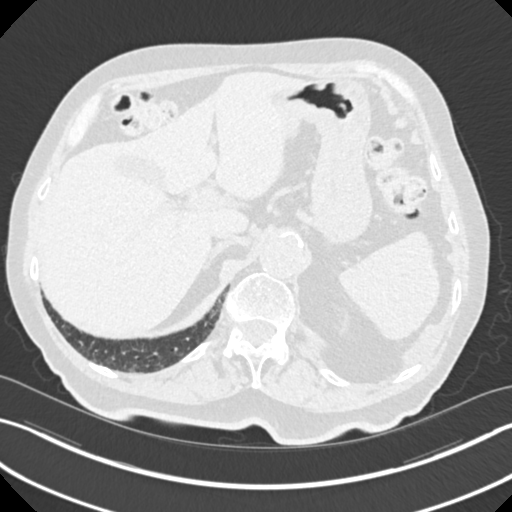
[im 35/151  lung]
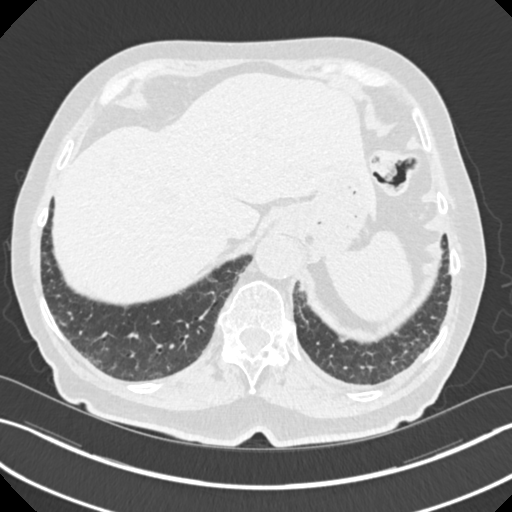
[im 47/151  lung]
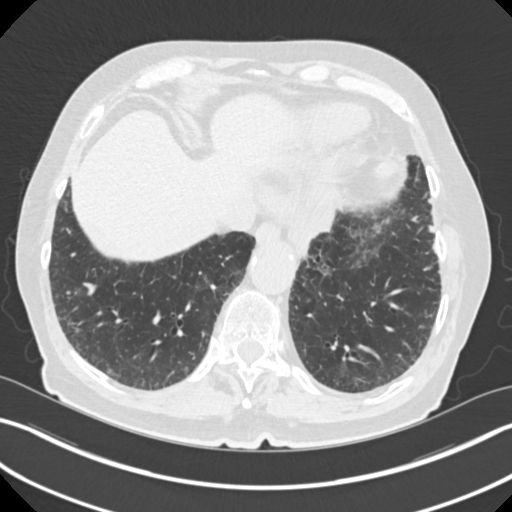
[im 58/151  mediastinal]
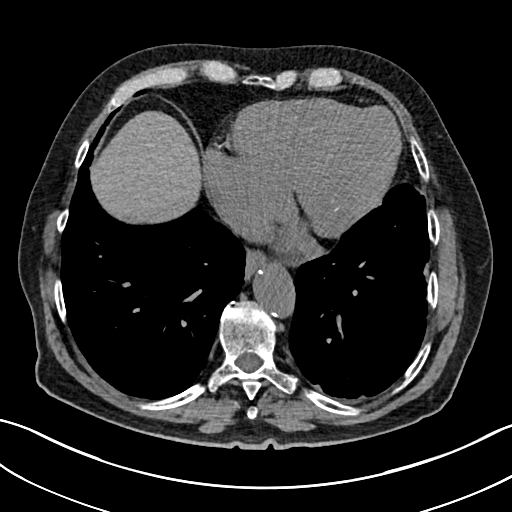
[im 58/151  lung]
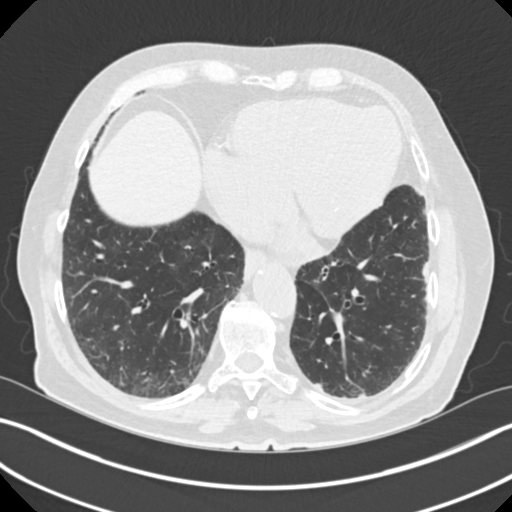
[im 70/151  lung]
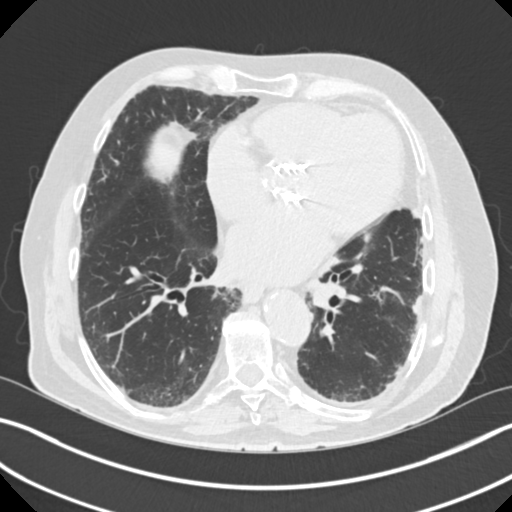
[im 81/151  lung]
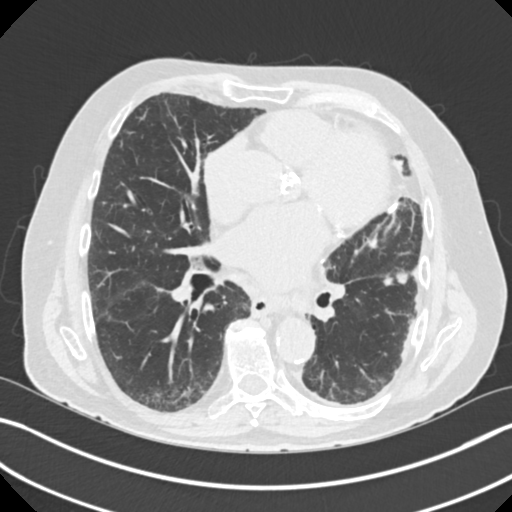
[im 93/151  lung]
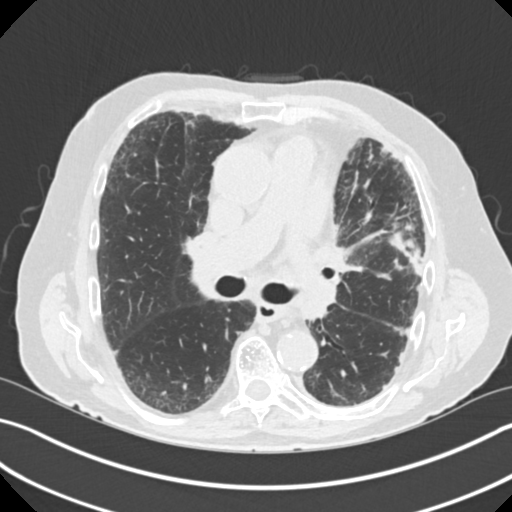
[im 104/151  mediastinal]
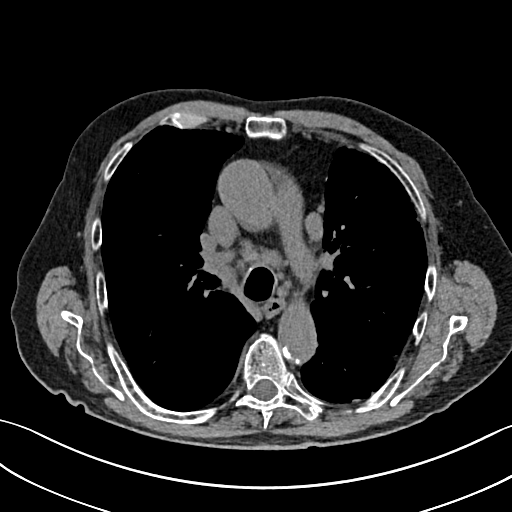
[im 104/151  lung]
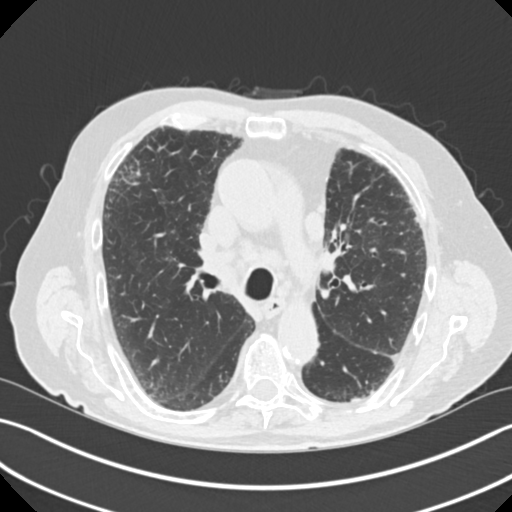
[im 116/151  lung]
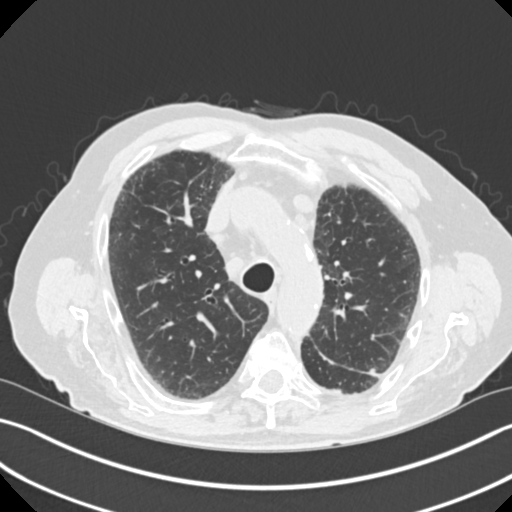
[im 127/151  lung]
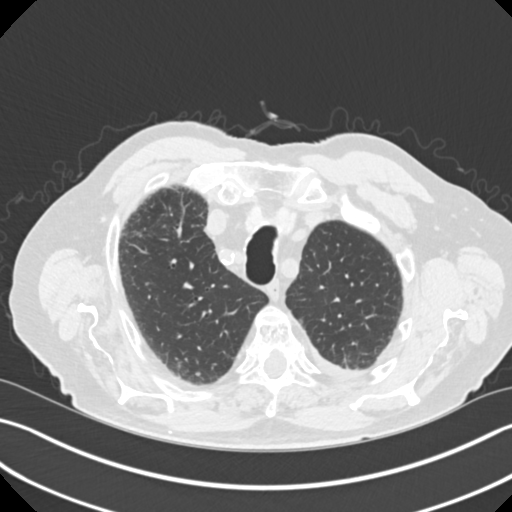
[im 139/151  lung]
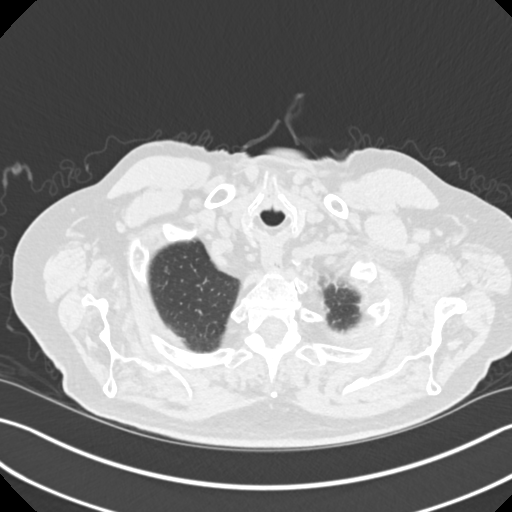

[Series 4: chest 2.00 br40 s3 · coronal · 0.59mm/px · 3 of 185 slices shown (2 of 2)]
[im 37/185  lung]
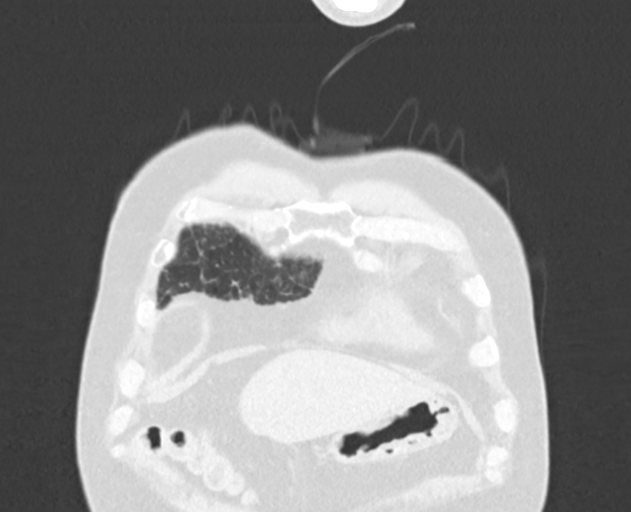
[im 74/185  lung]
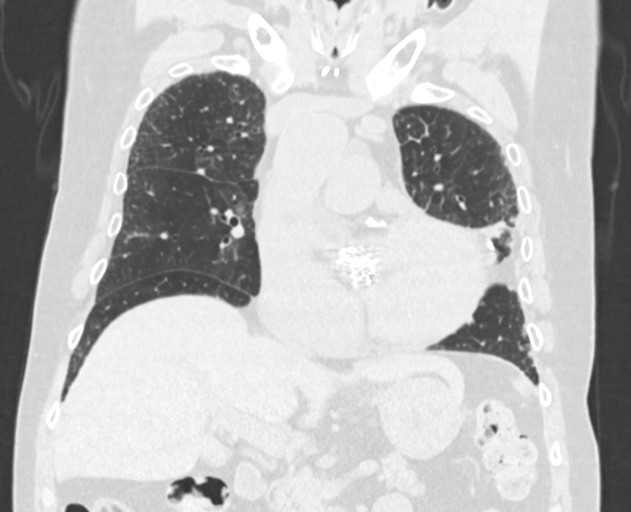
[im 111/185  lung]
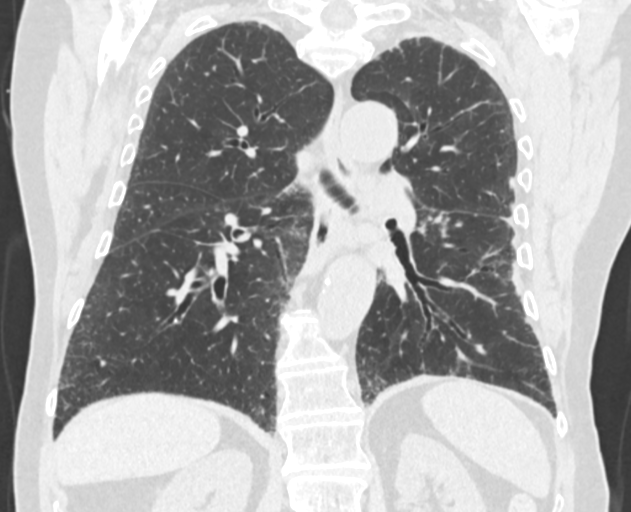

[15 of 36 positions shown; findings below may reference images not displayed]

FINDINGS: Cardiovascular: The heart is normal in size. No pericardial
effusion. Stable tortuosity and calcification of the thoracic aorta.
Stable three-vessel coronary artery calcifications.

Mediastinum/Nodes: Stable enlarged mediastinal and hilar lymph
nodes. No new or progressive findings. The esophagus is grossly
normal.

Lungs/Pleura: Stable postoperative changes from a left upper lobe
wedge resection.

Interval development of extensive pleural thickening and innumerable
pleural nodules consistent with metastatic pleural disease involving
the entire left hemithorax. There are also scattered parenchymal
nodules most notably in the left upper lobe inferiorly.

Stable appearing mild nodularity in the right middle lobe and stable
chronic scarring changes in the right lung. No definite metastatic
pulmonary nodules in the right lung.

No pleural effusions.

Upper Abdomen: No significant upper abdominal findings. Stable
vascular calcifications. There is a stable left adrenal gland nodule
since [YB] which is likely a lipid poor adenoma.

Musculoskeletal: No significant bony findings. Stable scoliosis and
degenerative changes but no worrisome bone lesions.
IMPRESSION: 1. Interval development of extensive metastatic pleural disease
involving the entire left hemithorax. PET-CT may be helpful for
further evaluation.
2. Stable enlarged mediastinal and hilar lymph nodes.
3. Stable postoperative changes from a left upper lobe wedge
resection.
4. Stable mild nodularity in the right middle lobe and stable
chronic scarring changes in the right lung.
5. Stable left adrenal gland nodule, likely lipid poor adenoma.

Aortic Atherosclerosis ([YB]-[YB]) and Emphysema ([YB]-[YB]).

## 2021-02-12 NOTE — Progress Notes (Signed)
HPI:  Patient returns for routine postoperative surveillance having undergone left muscle-sparing thoracotomy with wedge resection of a left upper lobe lung mass on 02/02/2019.  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   He denies any cough or sputum production.  He denies hemoptysis.  He has continued to abstain from smoking.  He is not very active and says that he does not feel like his stamina is as good as it should be.  He has had no headaches or visual changes.  He denies any muscle or bone pain.  Current Outpatient Medications  Medication Sig Dispense Refill   acetaminophen (TYLENOL) 500 MG tablet Take 2 tablets (1,000 mg total) by mouth every 6 (six) hours as needed for mild pain or fever. 30 tablet 0   diltiazem (CARDIZEM SR) 60 MG 12 hr capsule Take 1 capsule (60 mg total) by mouth every 12 (twelve) hours. 60 capsule 7   dorzolamide-timolol (COSOPT) 22.3-6.8 MG/ML ophthalmic solution 1 drop 2 (two) times daily.     glimepiride (AMARYL) 2 MG tablet Take 2 mg by mouth daily.     JARDIANCE 10 MG TABS tablet Take 10 mg by mouth daily.     metFORMIN (GLUCOPHAGE-XR) 500 MG 24 hr tablet Take 1,000 mg by mouth every evening.      metoprolol succinate (TOPROL-XL) 25 MG 24 hr tablet Take 50 mg by mouth daily.     Multiple Vitamins-Minerals (MULTIVITAMIN WITH MINERALS) tablet Take 1 tablet by mouth daily.     Multiple Vitamins-Minerals (ZINC PO) Take 1 tablet by mouth daily.     pantoprazole (PROTONIX) 40 MG tablet Take 40 mg by mouth daily.     simvastatin (ZOCOR) 40 MG tablet Take 40 mg by mouth at bedtime.     XARELTO 20 MG TABS tablet TAKE 1 TABLET BY MOUTH  DAILY WITH SUPPER 90 tablet 1   No current facility-administered medications for this visit.     Physical Exam: BP 135/89 (BP Location: Left Arm, Patient Position: Sitting)   Pulse 81   Resp 20   Wt 171 lb  (77.6 kg)   SpO2 97% Comment: RA  BMI 25.25 kg/m  He looks well. There is no cervical or supraclavicular adenopathy. Cardiac exam shows regular rate and rhythm with normal heart sounds.  There is no murmur. Lung exam is clear. The left chest incisions are normal. There is no peripheral edema.  Diagnostic Tests:  Narrative & Impression  CLINICAL DATA:  Followup pulmonary nodules. History of prior lung cancer status post wedge resection of a left upper lobe lung mass in 2020.   EXAM: CT CHEST WITHOUT CONTRAST   TECHNIQUE: Multidetector CT imaging of the chest was performed following the standard protocol without IV contrast.   COMPARISON:  Multiple prior CT scans.  The most recent is 08/07/2020   FINDINGS: Cardiovascular: The heart is normal in size. No pericardial effusion. Stable tortuosity and calcification of the thoracic aorta. Stable three-vessel coronary artery calcifications.   Mediastinum/Nodes: Stable enlarged mediastinal and hilar lymph nodes. No new or progressive findings. The esophagus is grossly normal.   Lungs/Pleura: Stable postoperative changes from a left upper lobe wedge resection.   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 There are also scattered parenchymal nodules most notably in the left upper lobe inferiorly.   Stable appearing  mild nodularity in the right middle lobe and stable chronic scarring changes in the right lung. No definite metastatic pulmonary nodules in the right lung.   No pleural effusions.   Upper Abdomen: No significant upper abdominal findings. Stable vascular calcifications. 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   Musculoskeletal: No significant bony findings. Stable scoliosis and degenerative changes but no worrisome bone lesions.    IMPRESSION: 1. 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 PET-CT may be helpful for further evaluation. 2. Stable enlarged mediastinal and hilar lymph nodes. 3. Stable postoperative changes from a left upper lobe wedge resection. 4. Stable mild nodularity in the right middle lobe and stable chronic scarring changes in the right lung. 5. 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   Aortic Atherosclerosis (ICD10-I70.0) and Emphysema (ICD10-J43.9).     Electronically Signed   By: Marijo Sanes M.D.   On: 02/12/2021 12:01    Impression:  He is now 2 years out from lung cancer resection and CT scan shows development of extensive left pleural metastatic disease that was not present on his CT scan in March 2022.  There are stable enlarged mediastinal and hilar lymph nodes which were present at the time of his surgery but not hypermetabolic on PET scan at that time.  I reviewed the CT scan images with him and answered his questions.  I discussed obtaining a PET scan to assess his disease further.  Plan:  He will be set up for a PET scan and I will see him back after that to discuss results with him.  If it confirms metastatic disease as I suspect then he will be referred to medical oncology for further evaluation.  I spent 20 minutes performing this established patient evaluation and > 50% of this time was spent face to face counseling and coordinating the surveillance of his previously resected lung cancer.  Gaye Pollack, MD [facility] [phone]

## 2021-02-14 ENCOUNTER — Ambulatory Visit (HOSPITAL_COMMUNITY)
Admission: RE | Admit: 2021-02-14 | Discharge: 2021-02-14 | Disposition: A | Discharge status: Home / Self Care | Payer: Medicare Other | Source: Ambulatory Visit | Attending: Surgery | Admitting: Surgery

## 2021-02-14 ENCOUNTER — Other Ambulatory Visit: Payer: Self-pay

## 2021-02-14 DIAGNOSIS — I700 Atherosclerosis of aorta: Secondary | ICD-10-CM | POA: Diagnosis not present

## 2021-02-14 DIAGNOSIS — C771 Secondary and unspecified malignant neoplasm of intrathoracic lymph nodes: Secondary | ICD-10-CM | POA: Diagnosis not present

## 2021-02-14 DIAGNOSIS — R918 Other nonspecific abnormal finding of lung field: Secondary | ICD-10-CM | POA: Insufficient documentation

## 2021-02-14 DIAGNOSIS — Z85118 Personal history of other malignant neoplasm of bronchus and lung: Secondary | ICD-10-CM

## 2021-02-14 DIAGNOSIS — Z79899 Other long term (current) drug therapy: Secondary | ICD-10-CM | POA: Diagnosis not present

## 2021-02-14 DIAGNOSIS — J90 Pleural effusion, not elsewhere classified: Secondary | ICD-10-CM | POA: Diagnosis not present

## 2021-02-14 DIAGNOSIS — C3490 Malignant neoplasm of unspecified part of unspecified bronchus or lung: Secondary | ICD-10-CM | POA: Diagnosis not present

## 2021-02-14 LAB — GLUCOSE, CAPILLARY: Glucose-Capillary: 176 mg/dL — ABNORMAL HIGH (ref 70–99)

## 2021-02-14 IMAGING — CT NM PET TUM IMG RESTAG (PS) SKULL BASE T - THIGH
8 series · 25 of 25 positions shown · non-contrast
Comparison: Chest CT [DATE].  PET-CT [DATE]

CLINICAL DATA: Subsequent treatment strategy for lung cancer.
History of left upper lobe wedge resection for neoplasm.

EXAM:
NUCLEAR MEDICINE PET SKULL BASE TO THIGH
TECHNIQUE: 8.5 mCi F-18 FDG was injected intravenously. Full-ring PET imaging
was performed from the skull base to thigh after the radiotracer. CT
data was obtained and used for attenuation correction and anatomic
localization.
Fasting blood glucose: 176 mg/dl

[Series 3: pet sk_thigh ac · axial · 5.0mm · 4.07mm/px · z∈[-612,+288]mm · 5 of 226 slices shown]
[im 1/226]
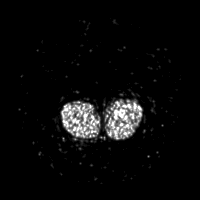
[im 57/226]
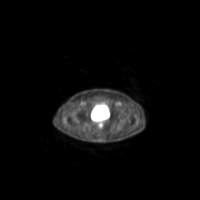
[im 113/226]
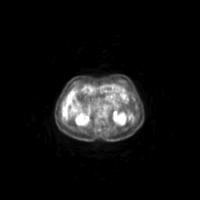
[im 169/226]
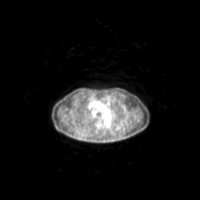
[im 226/226]
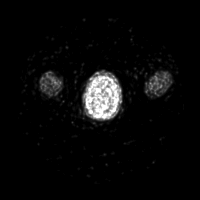

[Series 4: ct sk_thigh 5.0 bf37 · axial · 5.0mm · 0.98mm/px · z∈[-612,+288]mm · 5 of 226 slices shown]
[im 1/226]
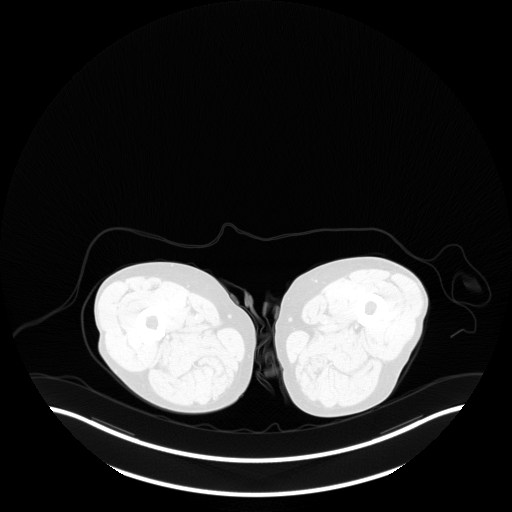
[im 57/226]
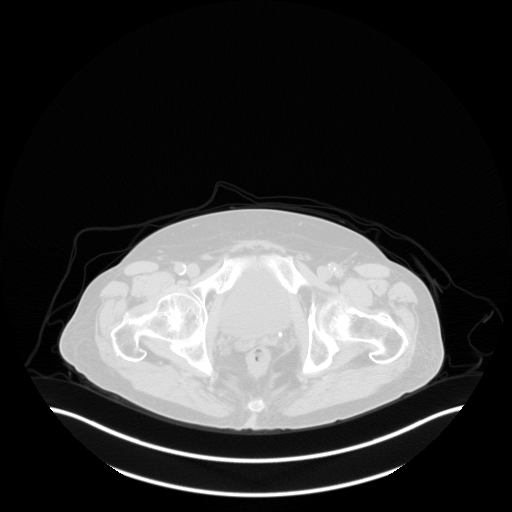
[im 113/226]
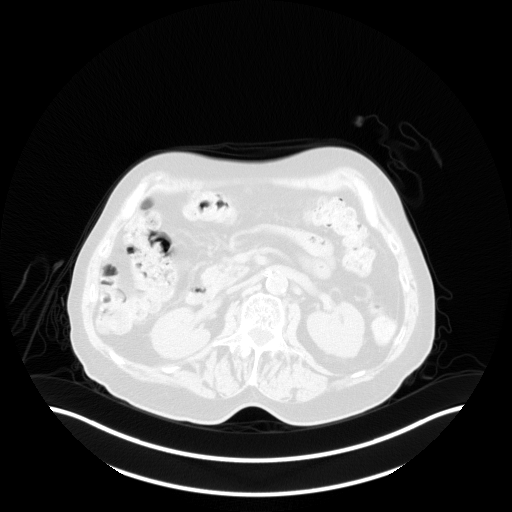
[im 169/226]
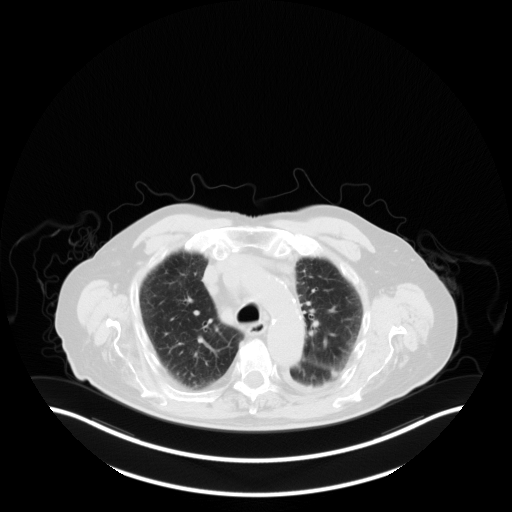
[im 226/226  brain]
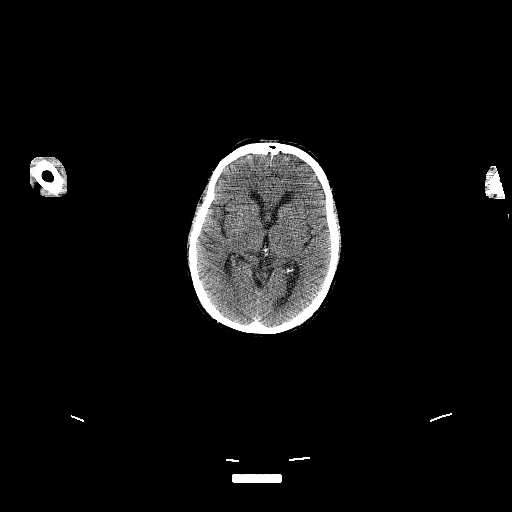

[Series 5: pet sk_thigh nac · axial · 5.0mm · 4.07mm/px · z∈[-612,+288]mm · 5 of 226 slices shown]
[im 1/226]
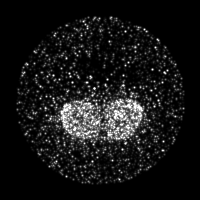
[im 57/226]
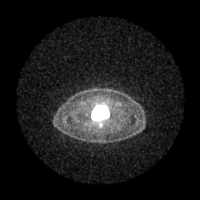
[im 113/226]
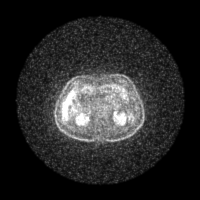
[im 169/226]
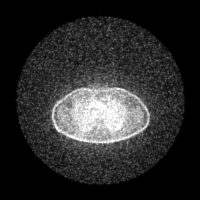
[im 226/226]
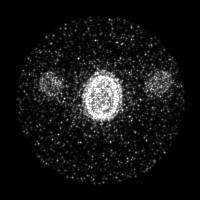

[Series 8: ct sk_thigh 5.0 br59 lung_bone · axial · 5.0mm · 0.67mm/px · z∈[-102,+142]mm · 2 of 62 slices shown]
[im 1/62]
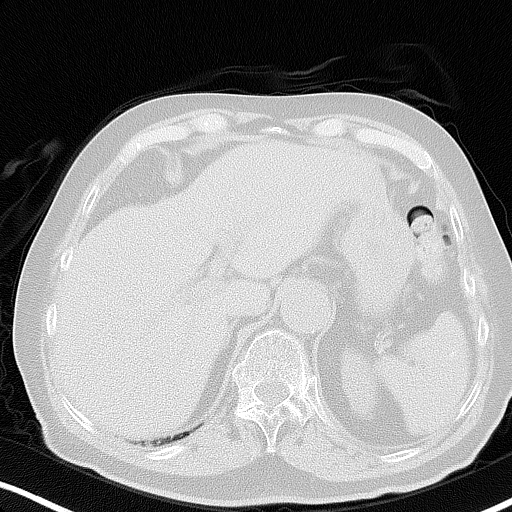
[im 62/62]
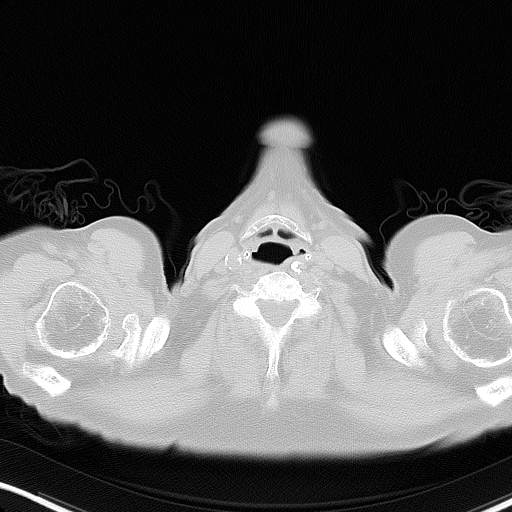

[Series 603: <mip collection> · coronal · 1.87mm/px · 1 of 32 slices shown]
[im 1/32]
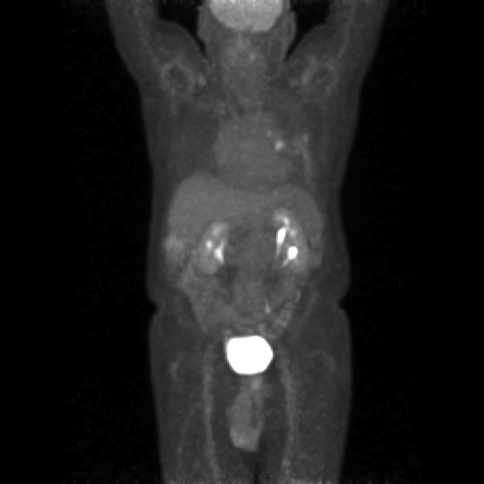

[Series 604: fused cor · 1 of 8 slices shown]
[im 1/8]
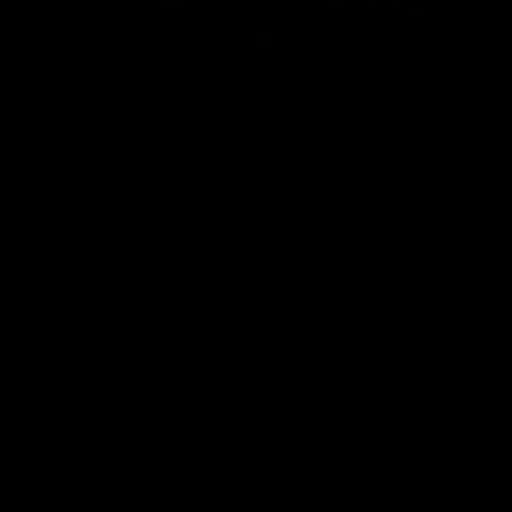

[Series 605: range-ct sk_thigh 5.0 bf37-tra-<alpha range> · 5 of 221 slices shown]
[im 1/221]
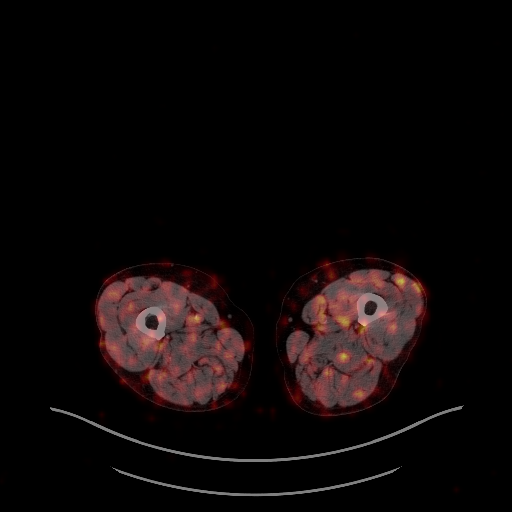
[im 56/221]
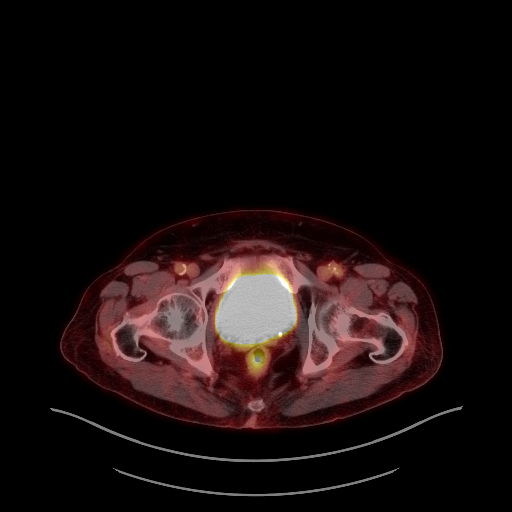
[im 111/221]
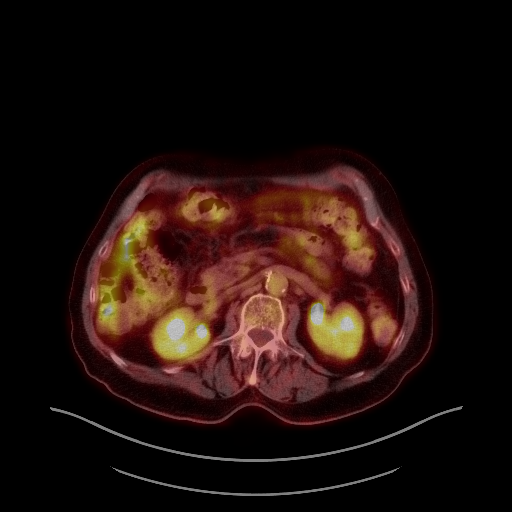
[im 166/221]
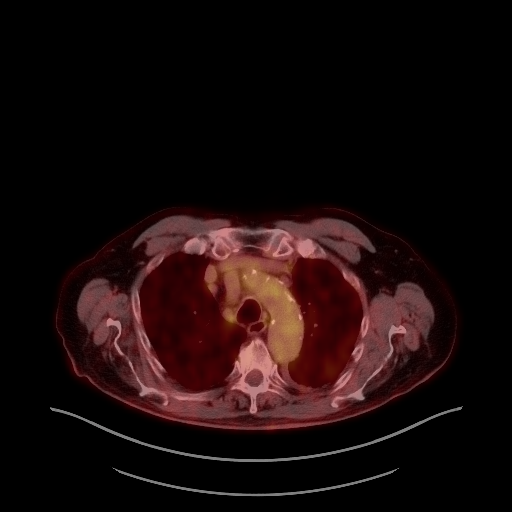
[im 221/221]
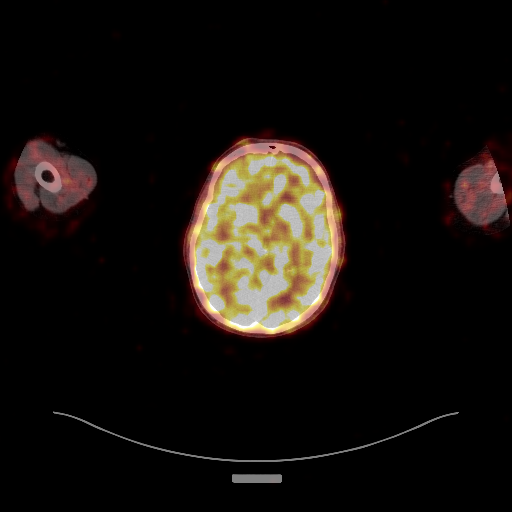

[Series 1159: results mm oncology reading · 4.0mm · 1.24mm/px · 1 of 4 slices shown]
[im 1/4]
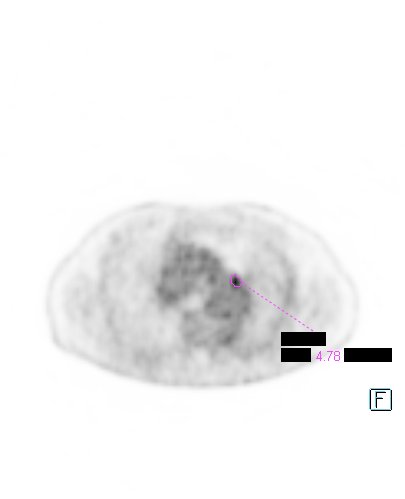

[25 of 25 positions shown; findings below may reference images not displayed]

FINDINGS: Mediastinal blood pool activity: SUV max

Liver activity: SUV max NA

NECK: No hypermetabolic lymph nodes in the neck.

Incidental CT findings: none

CHEST: Mediastinal and left hilar lymphadenopathy evident. Not all
of the mediastinal lymph nodes demonstrate hypermetabolism. A left
hilar node measuring 14 mm short axis on image 64/series 4 is
hypermetabolic with SUV max = 4.8. Focal hypermetabolism in the left
hilum with SUV max = 8.2 suggest metastatic disease although no
discrete lymph node evident at this location on today's exam without
contrast.

Low level hypermetabolism is seen relatively diffusely along the
left pleura. Nodular pleuroparenchymal left lower lobe opacity
measuring 11 mm on image 66/4 demonstrates SUV max = 3.7.

Incidental CT findings: Status post TAVR. There is mild
atherosclerotic calcification of the abdominal aorta without
aneurysm. Pleural and fissural nodularity seen on the previous CT is
again noted, similar. Tiny left pleural effusion.

ABDOMEN/PELVIS: No abnormal hypermetabolic activity within the
liver, pancreas, adrenal glands, or spleen. No hypermetabolic lymph
nodes in the abdomen or pelvis.

Incidental CT findings: 18 mm left adrenal nodule is stable,
previously characterized as adenoma. There is low level FDG
accumulation in the nodule today but this can be seen in the setting
of adenoma. Aortic endograft noted. Brachytherapy seeds evident in
the prostate gland.

SKELETON: No focal hypermetabolic activity to suggest skeletal
metastasis.

Incidental CT findings: No worrisome lytic or sclerotic osseous
abnormality.
IMPRESSION: 1. Hypermetabolic metastatic lymphadenopathy in the mediastinum and
left hilum.
2. Low level diffuse FDG accumulation along the pleural surfaces of
the left hemithorax suspicious for metastatic involvement.
3. No evidence for hypermetabolic metastatic disease in the abdomen
or pelvis.
4. 18 mm left adrenal nodule remains stable, previously
characterized as adenoma.

## 2021-02-14 MED ORDER — FLUDEOXYGLUCOSE F - 18 (FDG) INJECTION
8.9000 | Freq: Once | INTRAVENOUS | Status: AC
  Administered 2021-02-14: 8.54 via INTRAVENOUS

## 2021-02-17 ENCOUNTER — Encounter: Payer: Medicare Other | Admitting: Surgery

## 2021-02-18 ENCOUNTER — Ambulatory Visit (INDEPENDENT_AMBULATORY_CARE_PROVIDER_SITE_OTHER): Payer: Medicare Other | Admitting: Surgery

## 2021-02-18 ENCOUNTER — Other Ambulatory Visit: Payer: Self-pay

## 2021-02-18 ENCOUNTER — Encounter: Payer: Self-pay | Admitting: Surgery

## 2021-02-18 VITALS — BP 123/79 | HR 55 | Resp 20 | Ht 69.0 in | Wt 171.0 lb

## 2021-02-18 DIAGNOSIS — R918 Other nonspecific abnormal finding of lung field: Secondary | ICD-10-CM

## 2021-02-18 DIAGNOSIS — Z85118 Personal history of other malignant neoplasm of bronchus and lung: Secondary | ICD-10-CM

## 2021-02-18 DIAGNOSIS — D696 Thrombocytopenia, unspecified: Secondary | ICD-10-CM | POA: Diagnosis not present

## 2021-02-18 NOTE — Progress Notes (Signed)
HPI:  The patient returns today for follow-up to review the results of his recent PET scan done to evaluate new CT findings and the left chest.  This shows some hypermetabolic activity within mediastinal lymph nodes in the subcarinal region.  I do not see any significant activity in the paratracheal region.  There is a left hilar lymph node measuring 14 mm that has a maximum SUV of 4.8.  I think this lymph node is really para-aortic.  There is also focal hypermetabolism in the left hilum with a maximum SUV of 8.2 suggesting metastatic disease and I suspect there is a lymph node there although it is not clearly evident on the CT images.  There is some fullness at this location.  There is also low-level diffusely along the left pleural.  There is a nodular pleuroparenchymal left lower lobe opacity seen on CT scan as a maximum SUV of 3.7.  There is no hypermetabolic activity within the abdomen or pelvis and nothing within the bones.  There is an 18 mm left adrenal nodule which has been stable and has low-level FDG uptake.  He continues to feel fairly well overall.  Current Outpatient Medications  Medication Sig Dispense Refill   acetaminophen (TYLENOL) 500 MG tablet Take 2 tablets (1,000 mg total) by mouth every 6 (six) hours as needed for mild pain or fever. 30 tablet 0   diltiazem (CARDIZEM SR) 60 MG 12 hr capsule Take 1 capsule (60 mg total) by mouth every 12 (twelve) hours. 60 capsule 7   dorzolamide-timolol (COSOPT) 22.3-6.8 MG/ML ophthalmic solution 1 drop 2 (two) times daily.     glimepiride (AMARYL) 2 MG tablet Take 2 mg by mouth daily.     JARDIANCE 10 MG TABS tablet Take 10 mg by mouth daily.     metFORMIN (GLUCOPHAGE-XR) 500 MG 24 hr tablet Take 1,000 mg by mouth every evening.      metoprolol succinate (TOPROL-XL) 25 MG 24 hr tablet Take 50 mg by mouth daily.     Multiple Vitamins-Minerals (MULTIVITAMIN WITH MINERALS) tablet Take 1 tablet by mouth daily.     Multiple Vitamins-Minerals  (ZINC PO) Take 1 tablet by mouth daily.     pantoprazole (PROTONIX) 40 MG tablet Take 40 mg by mouth daily.     simvastatin (ZOCOR) 40 MG tablet Take 40 mg by mouth at bedtime.     XARELTO 20 MG TABS tablet TAKE 1 TABLET BY MOUTH  DAILY WITH SUPPER 90 tablet 1   No current facility-administered medications for this visit.   Physical Exam: BP 123/79   Pulse (!) 55   Resp 20   Ht 5\' 9"  (1.753 m)   Wt 171 lb (77.6 kg)   SpO2 90% Comment: RA  BMI 25.25 kg/m     Diagnostic Tests:  Narrative & Impression  CLINICAL DATA:  Subsequent treatment strategy for lung cancer. History of left upper lobe wedge resection for neoplasm.   EXAM: NUCLEAR MEDICINE PET SKULL BASE TO THIGH   TECHNIQUE: 8.5 mCi F-18 FDG was injected intravenously. Full-ring PET imaging was performed from the skull base to thigh after the radiotracer. CT data was obtained and used for attenuation correction and anatomic localization.   Fasting blood glucose: 176 mg/dl   COMPARISON:  Chest CT 02/12/2021.  PET-CT 11/10/2018   FINDINGS: Mediastinal blood pool activity: SUV max 2.9   Liver activity: SUV max NA   NECK: No hypermetabolic lymph nodes in the neck.   Incidental CT findings: none  CHEST: Mediastinal and left hilar lymphadenopathy evident. Not all of the mediastinal lymph nodes demonstrate hypermetabolism. A left hilar node measuring 14 mm short axis on image 64/series 4 is hypermetabolic with SUV max = 4.8. Focal hypermetabolism in the left hilum with SUV max = 8.2 suggest metastatic disease although no discrete lymph node evident at this location on today's exam without contrast.   Low level hypermetabolism is seen relatively diffusely along the left pleura. Nodular pleuroparenchymal left lower lobe opacity measuring 11 mm on image 66/4 demonstrates SUV max = 3.7.   Incidental CT findings: Status post TAVR. There is mild atherosclerotic calcification of the abdominal aorta without aneurysm.  Pleural and fissural nodularity seen on the previous CT is again noted, similar. Tiny left pleural effusion.   ABDOMEN/PELVIS: No abnormal hypermetabolic activity within the liver, pancreas, adrenal glands, or spleen. No hypermetabolic lymph nodes in the abdomen or pelvis.   Incidental CT findings: 18 mm left adrenal nodule is stable, previously characterized as adenoma. There is low level FDG accumulation in the nodule today but this can be seen in the setting of adenoma. Aortic endograft noted. Brachytherapy seeds evident in the prostate gland.   SKELETON: No focal hypermetabolic activity to suggest skeletal metastasis.   Incidental CT findings: No worrisome lytic or sclerotic osseous abnormality.   IMPRESSION: 1. Hypermetabolic metastatic lymphadenopathy in the mediastinum and left hilum. 2. Low level diffuse FDG accumulation along the pleural surfaces of the left hemithorax suspicious for metastatic involvement. 3. No evidence for hypermetabolic metastatic disease in the abdomen or pelvis. 4. 18 mm left adrenal nodule remains stable, previously characterized as adenoma.     Electronically Signed   By: Misty Stanley M.D.   On: 02/16/2021 09:22      Impression:  His PET scan is suspicious for metastatic disease to the mediastinal and left hilar lymph nodes as well as the left pleural space.  I think the subcarinal and left hilar lymph nodes would be amenable to biopsy with endobronchial ultrasound.  I reviewed the CT and PET images with him and my recommendation for consideration of biopsy.  He would like to proceed in that direction.  Plan:  I will set him up to see Dr. Roxan Hockey for evaluation and consideration of endobronchial ultrasound biopsy of the mediastinal and hilar lymph nodes.  I spent 15 minutes performing this established patient evaluation and > 50% of this time was spent face to face counseling and coordinating the care of this patient's hypermetabolic  mediastinal and left hilar lymphadenopathy and left pleural space disease that is suspicious for metastases.   Gaye Pollack, MD [facility] [phone]

## 2021-02-24 ENCOUNTER — Encounter: Payer: Medicare Other | Admitting: Thoracic Surgery (Cardiothoracic Vascular Surgery)

## 2021-02-27 ENCOUNTER — Other Ambulatory Visit: Payer: Self-pay

## 2021-02-27 ENCOUNTER — Other Ambulatory Visit: Payer: Self-pay | Admitting: *Deleted

## 2021-02-27 ENCOUNTER — Encounter: Payer: Self-pay | Admitting: *Deleted

## 2021-02-27 ENCOUNTER — Institutional Professional Consult (permissible substitution) (INDEPENDENT_AMBULATORY_CARE_PROVIDER_SITE_OTHER): Payer: Medicare Other | Admitting: Thoracic Surgery (Cardiothoracic Vascular Surgery)

## 2021-02-27 VITALS — BP 132/79 | HR 72 | Resp 20 | Ht 69.0 in | Wt 171.0 lb

## 2021-02-27 DIAGNOSIS — Z85118 Personal history of other malignant neoplasm of bronchus and lung: Secondary | ICD-10-CM

## 2021-02-27 DIAGNOSIS — R590 Localized enlarged lymph nodes: Secondary | ICD-10-CM

## 2021-02-27 DIAGNOSIS — I4819 Other persistent atrial fibrillation: Secondary | ICD-10-CM

## 2021-02-27 DIAGNOSIS — C3490 Malignant neoplasm of unspecified part of unspecified bronchus or lung: Secondary | ICD-10-CM | POA: Diagnosis not present

## 2021-02-27 NOTE — Progress Notes (Signed)
[address]       [place] [address]             [phone]      HPI: Vincent Velez is referred by Dr. Cyndia Bent for endobronchial ultrasound.  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 fib, tobacco abuse, COPD, abdominal aortic aneurysm, arthritis, type 2 diabetes, reflux, hiatal hernia, esophageal stricture, hyperlipidemia, hypertension, prostate cancer, and PAD.  He had a TAVR in 2020.  He had a wedge resection of a left upper lobe mass that turned out to be a stage Ib adenocarcinoma by Dr. Cyndia Bent that year as well.  He had a mediastinoscopy prior to the wedge resection, which showed no involvement of the mediastinal lymph nodes.    He recently had a follow-up scan with Dr. Cyndia Bent.  It showed evidence of metastatic disease involving the pleura.  He had a PET/CT which confirmed that finding and also showed a hypermetabolic left hilar (11 R) node.  He has chronic shortness of breath.  No recent changes.  He denies any chest pain, pressure, or tightness currently.  Physical activities are limited.  Past Medical History:  Diagnosis Date   AAA (abdominal aortic aneurysm) ([place])    Anxiety    Arthritis    Asthma    when younger   Atrial fibrillation ([facility])    CAD (coronary artery disease)    Cataract    removed both   COPD (chronic obstructive pulmonary disease) ([facility])    Diabetes mellitus    Type II   Dysrhythmia    afib   Esophageal reflux    Family history of malignant neoplasm of gastrointestinal tract    Heart murmur    Hiatal hernia    Hyperlipemia    Hypertension    Lung nodule    a. PET scan highly suspicious for malignancy. Bronch with biopsy to be done after TAVR   Malignant neoplasm of prostate ([place])    prostate    Peripheral vascular disease ([place])    Pneumonia    Stricture and stenosis of  esophagus    Past Surgical History:  Procedure Laterality Date   ABDOMINAL AORTA STENT     CARDIAC CATHETERIZATION     CARDIOVERSION N/A 12/04/2015   Procedure: CARDIOVERSION;  Surgeon: Sanda Klein, MD;  Location: [facility];  Service: Cardiovascular;  Laterality: N/A;   COLONOSCOPY     FINGER SURGERY Right    middle finger- amputation   INSERTION PROSTATE RADIATION SEED     MEDIASTINOSCOPY N/A 01/06/2019   Procedure: MEDIASTINOSCOPY WITH BIOPIES;  Surgeon: Gaye Pollack, MD;  Location: [facility];  Service: Thoracic;  Laterality: N/A;   RIGHT/LEFT HEART CATH AND CORONARY ANGIOGRAPHY N/A 10/26/2018   Procedure: RIGHT/LEFT HEART CATH AND CORONARY ANGIOGRAPHY;  Surgeon: Burnell Blanks, MD;  Location: [facility];  Service: Cardiovascular;  Laterality: N/A;   TEE WITHOUT CARDIOVERSION N/A 12/06/2018   Procedure: TRANSESOPHAGEAL ECHOCARDIOGRAM (TEE);  Surgeon: Burnell Blanks, MD;  Location: [facility];  Service: Open Heart Surgery;  Laterality: N/A;   TRANSCATHETER AORTIC VALVE REPLACEMENT, TRANSFEMORAL N/A 12/06/2018   Procedure: TRANSCATHETER AORTIC VALVE REPLACEMENT, TRANSFEMORAL;  Surgeon: Burnell Blanks, MD;  Location: [facility];  Service: Open Heart Surgery;  Laterality: N/A;   UPPER GASTROINTESTINAL ENDOSCOPY  VIDEO ASSISTED THORACOSCOPY (VATS)/THOROCOTOMY Left 02/02/2019   Procedure: VIDEO ASSISTED THORACOSCOPY (VATS)/THOROCOTOMY;  Surgeon: Gaye Pollack, MD;  Location: [facility];  Service: Thoracic;  Laterality: Left;   VIDEO BRONCHOSCOPY N/A 01/06/2019   Procedure: VIDEO BRONCHOSCOPY;  Surgeon: Gaye Pollack, MD;  Location: [facility];  Service: Thoracic;  Laterality: N/A;   WEDGE RESECTION Left 02/02/2019   Procedure: LUNG RESECTION;  Surgeon: Gaye Pollack, MD;  Location: [facility];  Service: Thoracic;  Laterality: Left;    Current Outpatient Medications  Medication Sig Dispense Refill   acetaminophen (TYLENOL) 500 MG tablet Take 2 tablets  (1,000 mg total) by mouth every 6 (six) hours as needed for mild pain or fever. 30 tablet 0   diltiazem (CARDIZEM SR) 60 MG 12 hr capsule Take 1 capsule (60 mg total) by mouth every 12 (twelve) hours. 60 capsule 7   dorzolamide-timolol (COSOPT) 22.3-6.8 MG/ML ophthalmic solution 1 drop 2 (two) times daily.     glimepiride (AMARYL) 2 MG tablet Take 2 mg by mouth daily.     JARDIANCE 10 MG TABS tablet Take 10 mg by mouth daily.     metFORMIN (GLUCOPHAGE-XR) 500 MG 24 hr tablet Take 1,000 mg by mouth every evening.      metoprolol succinate (TOPROL-XL) 25 MG 24 hr tablet Take 50 mg by mouth daily.     Multiple Vitamins-Minerals (MULTIVITAMIN WITH MINERALS) tablet Take 1 tablet by mouth daily.     Multiple Vitamins-Minerals (ZINC PO) Take 1 tablet by mouth daily.     pantoprazole (PROTONIX) 40 MG tablet Take 40 mg by mouth daily.     simvastatin (ZOCOR) 40 MG tablet Take 40 mg by mouth at bedtime.     XARELTO 20 MG TABS tablet TAKE 1 TABLET BY MOUTH  DAILY WITH SUPPER 90 tablet 1   No current facility-administered medications for this visit.    Physical Exam BP 132/79   Pulse 72   Resp 20   Ht 5\' 9"  (1.753 m)   Wt 171 lb (77.6 kg)   SpO2 100% Comment: RA  BMI 25.91 kg/m  80 year old man in no acute distress Alert and oriented x3 with no focal deficits Lungs clear bilaterally No cervical or supraclavicular adenopathy Cardiac regular rate and rhythm with a 2/6 systolic murmur No peripheral edema  Diagnostic Tests: NUCLEAR MEDICINE PET SKULL BASE TO THIGH   TECHNIQUE: 8.5 mCi F-18 FDG was injected intravenously. Full-ring PET imaging was performed from the skull base to thigh after the radiotracer. CT data was obtained and used for attenuation correction and anatomic localization.   Fasting blood glucose: 176 mg/dl   COMPARISON:  Chest CT 02/12/2021.  PET-CT 11/10/2018   FINDINGS: Mediastinal blood pool activity: SUV max 2.9   Liver activity: SUV max NA   NECK: No  hypermetabolic lymph nodes in the neck.   Incidental CT findings: none   CHEST: Mediastinal and left hilar lymphadenopathy evident. Not all of the mediastinal lymph nodes demonstrate hypermetabolism. A left hilar node measuring 14 mm short axis on image 64/series 4 is hypermetabolic with SUV max = 4.8. Focal hypermetabolism in the left hilum with SUV max = 8.2 suggest metastatic disease although no discrete lymph node evident at this location on today's exam without contrast.   Low level hypermetabolism is seen relatively diffusely along the left pleura. Nodular pleuroparenchymal left lower lobe opacity measuring 11 mm on image 66/4 demonstrates SUV max = 3.7.   Incidental CT findings: Status post TAVR. There is mild  atherosclerotic calcification of the abdominal aorta without aneurysm. Pleural and fissural nodularity seen on the previous CT is again noted, similar. Tiny left pleural effusion.   ABDOMEN/PELVIS: No abnormal hypermetabolic activity within the liver, pancreas, adrenal glands, or spleen. No hypermetabolic lymph nodes in the abdomen or pelvis.   Incidental CT findings: 18 mm left adrenal nodule is stable, previously characterized as adenoma. There is low level FDG accumulation in the nodule today but this can be seen in the setting of adenoma. Aortic endograft noted. Brachytherapy seeds evident in the prostate gland.   SKELETON: No focal hypermetabolic activity to suggest skeletal metastasis.   Incidental CT findings: No worrisome lytic or sclerotic osseous abnormality.   IMPRESSION: 1. Hypermetabolic metastatic lymphadenopathy in the mediastinum and left hilum. 2. Low level diffuse FDG accumulation along the pleural surfaces of the left hemithorax suspicious for metastatic involvement. 3. No evidence for hypermetabolic metastatic disease in the abdomen or pelvis. 4. 18 mm left adrenal nodule remains stable, previously characterized as adenoma.      Electronically Signed   By: Misty Stanley M.D.   On: 02/16/2021 09:22 I personally reviewed the PET/CT images.  There is a hypermetabolic 11 R node on the left.  There is some mild activity in the subcarinal nodes as well.  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 fib, tobacco abuse, COPD, abdominal aortic aneurysm, arthritis, type 2 diabetes, reflux, hiatal hernia, esophageal stricture, hyperlipidemia, hypertension, prostate cancer, and PAD.  He had a wedge resection of of the left upper lobe for a T2, N0, stage Ib adenocarcinoma in 2020.  He had mediastinal anoscopy prior to that surgery which showed no N2 involvement.  Now on follow-up he has evidence of pleural-based metastasis but only a very small effusion.  There is a hypermetabolic 11 L node that is highly suspicious and is the best site for potential biopsy for confirmation.  I recommend that we proceed with endobronchial ultrasound for sampling of the hilar node as well as any mediastinal nodes that we can identify.  I described the proposed procedure to him.  He understands it is an endoscopic procedure that would be done under general anesthesia.  I informed him of the indications, risk, benefits, and alternatives.  He understands the risks include those associated with general anesthesia.  He understands the risks include but not limited to heart attack, blood clots, stroke, bleeding, pneumothorax, and failure to make a diagnosis, as well as possibility of other unforeseeable complications.  Plan: He accepts the risk and agrees to proceed.   We will plan to proceed with bronchoscopy and endobronchial ultrasound on Friday, 03/07/2021 Hold Xarelto for 48 hours prior to procedure  [name], MD [facility] [phone]

## 2021-02-27 NOTE — H&P (View-Only) (Signed)
[facility] [address]       Velez,Vincent Velez             [id number]      HPI: Vincent Velez is referred by Dr. Cyndia Bent for endobronchial ultrasound.  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 fib, tobacco abuse, COPD, abdominal aortic aneurysm, arthritis, type 2 diabetes, reflux, hiatal hernia, esophageal stricture, hyperlipidemia, hypertension, prostate cancer, and PAD.  He had a TAVR in 2020.  He had a wedge resection of a left upper lobe mass that turned out to be a stage Ib adenocarcinoma by Dr. Cyndia Bent that year as well.  He had a mediastinoscopy prior to the wedge resection, which showed no involvement of the mediastinal lymph nodes.    He recently had a follow-up scan with Dr. Cyndia Bent.  It showed evidence of metastatic disease involving the pleura.  He had a PET/CT which confirmed that finding and also showed a hypermetabolic left hilar (11 R) node.  He has chronic shortness of breath.  No recent changes.  He denies any chest pain, pressure, or tightness currently.  Physical activities are limited.  Past Medical History:  Diagnosis Date   AAA (abdominal aortic aneurysm) ([place])    Anxiety    Arthritis    Asthma    when younger   Atrial fibrillation ([facility])    CAD (coronary artery disease)    Cataract    removed both   COPD (chronic obstructive pulmonary disease) ([facility])    Diabetes mellitus    Type II   Dysrhythmia    afib   Esophageal reflux    Family history of malignant neoplasm of gastrointestinal tract    Heart murmur    Hiatal hernia    Hyperlipemia    Hypertension    Lung nodule    a. PET scan highly suspicious for malignancy. Bronch with biopsy to be done after TAVR   Malignant neoplasm of prostate ([place])    prostate    Peripheral vascular disease ([place])    Pneumonia    Stricture and stenosis of  esophagus    Past Surgical History:  Procedure Laterality Date   ABDOMINAL AORTA STENT     CARDIAC CATHETERIZATION     CARDIOVERSION N/A 12/04/2015   Procedure: CARDIOVERSION;  Surgeon: Sanda Klein, MD;  Location: [facility];  Service: Cardiovascular;  Laterality: N/A;   COLONOSCOPY     FINGER SURGERY Right    middle finger- amputation   INSERTION PROSTATE RADIATION SEED     MEDIASTINOSCOPY N/A 01/06/2019   Procedure: MEDIASTINOSCOPY WITH BIOPIES;  Surgeon: Gaye Pollack, MD;  Location: [facility];  Service: Thoracic;  Laterality: N/A;   RIGHT/LEFT HEART CATH AND CORONARY ANGIOGRAPHY N/A 10/26/2018   Procedure: RIGHT/LEFT HEART CATH AND CORONARY ANGIOGRAPHY;  Surgeon: Burnell Blanks, MD;  Location: [facility];  Service: Cardiovascular;  Laterality: N/A;   TEE WITHOUT CARDIOVERSION N/A 12/06/2018   Procedure: TRANSESOPHAGEAL ECHOCARDIOGRAM (TEE);  Surgeon: Burnell Blanks, MD;  Location: [facility];  Service: Open Heart Surgery;  Laterality: N/A;   TRANSCATHETER AORTIC VALVE REPLACEMENT, TRANSFEMORAL N/A 12/06/2018   Procedure: TRANSCATHETER AORTIC VALVE REPLACEMENT, TRANSFEMORAL;  Surgeon: Burnell Blanks, MD;  Location: [facility];  Service: Open Heart Surgery;  Laterality: N/A;   UPPER GASTROINTESTINAL ENDOSCOPY  VIDEO ASSISTED THORACOSCOPY (VATS)/THOROCOTOMY Left 02/02/2019   Procedure: VIDEO ASSISTED THORACOSCOPY (VATS)/THOROCOTOMY;  Surgeon: Gaye Pollack, MD;  Location: [facility];  Service: Thoracic;  Laterality: Left;   VIDEO BRONCHOSCOPY N/A 01/06/2019   Procedure: VIDEO BRONCHOSCOPY;  Surgeon: Gaye Pollack, MD;  Location: [facility];  Service: Thoracic;  Laterality: N/A;   WEDGE RESECTION Left 02/02/2019   Procedure: LUNG RESECTION;  Surgeon: Gaye Pollack, MD;  Location: [facility];  Service: Thoracic;  Laterality: Left;    Current Outpatient Medications  Medication Sig Dispense Refill   acetaminophen (TYLENOL) 500 MG tablet Take 2 tablets  (1,000 mg total) by mouth every 6 (six) hours as needed for mild pain or fever. 30 tablet 0   diltiazem (CARDIZEM SR) 60 MG 12 hr capsule Take 1 capsule (60 mg total) by mouth every 12 (twelve) hours. 60 capsule 7   dorzolamide-timolol (COSOPT) 22.3-6.8 MG/ML ophthalmic solution 1 drop 2 (two) times daily.     glimepiride (AMARYL) 2 MG tablet Take 2 mg by mouth daily.     JARDIANCE 10 MG TABS tablet Take 10 mg by mouth daily.     metFORMIN (GLUCOPHAGE-XR) 500 MG 24 hr tablet Take 1,000 mg by mouth every evening.      metoprolol succinate (TOPROL-XL) 25 MG 24 hr tablet Take 50 mg by mouth daily.     Multiple Vitamins-Minerals (MULTIVITAMIN WITH MINERALS) tablet Take 1 tablet by mouth daily.     Multiple Vitamins-Minerals (ZINC PO) Take 1 tablet by mouth daily.     pantoprazole (PROTONIX) 40 MG tablet Take 40 mg by mouth daily.     simvastatin (ZOCOR) 40 MG tablet Take 40 mg by mouth at bedtime.     XARELTO 20 MG TABS tablet TAKE 1 TABLET BY MOUTH  DAILY WITH SUPPER 90 tablet 1   No current facility-administered medications for this visit.    Physical Exam BP 132/79   Pulse 72   Resp 20   Ht 5\' 9"  (1.753 m)   Wt 171 lb (77.6 kg)   SpO2 100% Comment: RA  BMI 25.27 kg/m  80 year old man in no acute distress Alert and oriented x3 with no focal deficits Lungs clear bilaterally No cervical or supraclavicular adenopathy Cardiac regular rate and rhythm with a 2/6 systolic murmur No peripheral edema  Diagnostic Tests: NUCLEAR MEDICINE PET SKULL BASE TO THIGH   TECHNIQUE: 8.5 mCi F-18 FDG was injected intravenously. Full-ring PET imaging was performed from the skull base to thigh after the radiotracer. CT data was obtained and used for attenuation correction and anatomic localization.   Fasting blood glucose: 176 mg/dl   COMPARISON:  Chest CT 02/12/2021.  PET-CT 11/10/2018   FINDINGS: Mediastinal blood pool activity: SUV max 2.9   Liver activity: SUV max NA   NECK: No  hypermetabolic lymph nodes in the neck.   Incidental CT findings: none   CHEST: Mediastinal and left hilar lymphadenopathy evident. Not all of the mediastinal lymph nodes demonstrate hypermetabolism. A left hilar node measuring 14 mm short axis on image 64/series 4 is hypermetabolic with SUV max = 4.8. Focal hypermetabolism in the left hilum with SUV max = 8.2 suggest metastatic disease although no discrete lymph node evident at this location on today's exam without contrast.   Low level hypermetabolism is seen relatively diffusely along the left pleura. Nodular pleuroparenchymal left lower lobe opacity measuring 11 mm on image 66/4 demonstrates SUV max = 3.7.   Incidental CT findings: Status post TAVR. There is mild  atherosclerotic calcification of the abdominal aorta without aneurysm. Pleural and fissural nodularity seen on the previous CT is again noted, similar. Tiny left pleural effusion.   ABDOMEN/PELVIS: No abnormal hypermetabolic activity within the liver, pancreas, adrenal glands, or spleen. No hypermetabolic lymph nodes in the abdomen or pelvis.   Incidental CT findings: 18 mm left adrenal nodule is stable, previously characterized as adenoma. There is low level FDG accumulation in the nodule today but this can be seen in the setting of adenoma. Aortic endograft noted. Brachytherapy seeds evident in the prostate gland.   SKELETON: No focal hypermetabolic activity to suggest skeletal metastasis.   Incidental CT findings: No worrisome lytic or sclerotic osseous abnormality.   IMPRESSION: 1. Hypermetabolic metastatic lymphadenopathy in the mediastinum and left hilum. 2. Low level diffuse FDG accumulation along the pleural surfaces of the left hemithorax suspicious for metastatic involvement. 3. No evidence for hypermetabolic metastatic disease in the abdomen or pelvis. 4. 18 mm left adrenal nodule remains stable, previously characterized as adenoma.      Electronically Signed   By: Misty Stanley M.D.   On: 02/16/2021 09:22 I personally reviewed the PET/CT images.  There is a hypermetabolic 11 R node on the left.  There is some mild activity in the subcarinal nodes as well.  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 fib, tobacco abuse, COPD, abdominal aortic aneurysm, arthritis, type 2 diabetes, reflux, hiatal hernia, esophageal stricture, hyperlipidemia, hypertension, prostate cancer, and PAD.  He had a wedge resection of of the left upper lobe for a T2, N0, stage Ib adenocarcinoma in 2020.  He had mediastinal anoscopy prior to that surgery which showed no N2 involvement.  Now on follow-up he has evidence of pleural-based metastasis but only a very small effusion.  There is a hypermetabolic 11 L node that is highly suspicious and is the best site for potential biopsy for confirmation.  I recommend that we proceed with endobronchial ultrasound for sampling of the hilar node as well as any mediastinal nodes that we can identify.  I described the proposed procedure to him.  He understands it is an endoscopic procedure that would be done under general anesthesia.  I informed him of the indications, risk, benefits, and alternatives.  He understands the risks include those associated with general anesthesia.  He understands the risks include but not limited to heart attack, blood clots, stroke, bleeding, pneumothorax, and failure to make a diagnosis, as well as possibility of other unforeseeable complications.  Plan: He accepts the risk and agrees to proceed.   We will plan to proceed with bronchoscopy and endobronchial ultrasound on Friday, 03/07/2021 Hold Xarelto for 48 hours prior to procedure  [name], MD [facility] [phone]

## 2021-02-27 NOTE — Patient Instructions (Signed)
Hold Xarelto for 2 days prior to procedure

## 2021-03-04 DIAGNOSIS — Z23 Encounter for immunization: Secondary | ICD-10-CM | POA: Diagnosis not present

## 2021-03-04 NOTE — Progress Notes (Signed)
Surgical Instructions    Your procedure is scheduled on Friday, October 7th..  Report to [facility] Main Entrance "A" at 12:00 P.M., then check in with the Admitting office.  Call this number if you have problems the morning of surgery:  [phone]   If you have any questions prior to your surgery date call [phone]: Open Monday-Friday 8am-4pm    Remember:  Do not eat after midnight the night before your surgery  You may drink clear liquids until 11:00 AM the morning of your surgery.   Clear liquids allowed are: Water, Non-Citrus Juices (without pulp), Carbonated Beverages, Clear Tea, Black Coffee ONLY (NO MILK, CREAM OR POWDERED CREAMER of any kind), and Gatorade    Take these medicines the morning of surgery with A SIP OF WATER Diltiazem (Cardizem) Metoprolol Pantoprazole (Protonix)  If needed:   Tylenol  Eye drops  Follow your surgeon's instructions on when to stop Xarelto.  If no instructions were given by your surgeon then you will need to call the office to get those instructions.    As of today, STOP taking any Aspirin (unless otherwise instructed by your surgeon) Aleve, Naproxen, Ibuprofen, Motrin, Advil, Goody's, BC's, all herbal medications, fish oil, and all vitamins.  WHAT DO I DO ABOUT MY DIABETES MEDICATION?   Do not take oral diabetes medicines (pills) the morning of surgery. - Glimepiride (Amaryl), Jardiance, Metformin  THE DAY BEFORE SURGERY, do NOT take Jardiance  THE NIGHT BEFORE SURGERY, do NOT take evening dose of Glimepiride (Amaryl)     HOW TO MANAGE YOUR DIABETES BEFORE AND AFTER SURGERY  Why is it important to control my blood sugar before and after surgery? Improving blood sugar levels before and after surgery helps healing and can limit problems. A way of improving blood sugar control is eating a healthy diet by:  Eating less sugar and carbohydrates  Increasing activity/exercise  Talking with your doctor about reaching your blood sugar  goals High blood sugars (greater than 180 mg/dL) can raise your risk of infections and slow your recovery, so you will need to focus on controlling your diabetes during the weeks before surgery. Make sure that the doctor who takes care of your diabetes knows about your planned surgery including the date and location.  How do I manage my blood sugar before surgery? Check your blood sugar at least 4 times a day, starting 2 days before surgery, to make sure that the level is not too high or low.  Check your blood sugar the morning of your surgery when you wake up and every 2 hours until you get to the Short Stay unit.  If your blood sugar is less than 70 mg/dL, you will need to treat for low blood sugar: Do not take insulin. Treat a low blood sugar (less than 70 mg/dL) with  cup of clear juice (cranberry or apple), 4 glucose tablets, OR glucose gel. Recheck blood sugar in 15 minutes after treatment (to make sure it is greater than 70 mg/dL). If your blood sugar is not greater than 70 mg/dL on recheck, call [phone] for further instructions. Report your blood sugar to the short stay nurse when you get to Short Stay.  If you are admitted to the hospital after surgery: Your blood sugar will be checked by the staff and you will probably be given insulin after surgery (instead of oral diabetes medicines) to make sure you have good blood sugar levels. The goal for blood sugar control after surgery is 80-180 mg/dL.  DAY OF SURGERY: Do not wear jewelry  Do not wear lotions, powders, colognes, or deodorant. Men may shave face and neck. Do not bring valuables to the hospital.             [facility] is not responsible for any belongings or valuables.  Do NOT Smoke (Tobacco/Vaping)  24 hours prior to your procedure If you use a CPAP at night, you may bring your mask for your overnight stay.   Contacts, glasses, dentures or bridgework may not be worn into surgery, please  bring cases for these belongings   For patients admitted to the hospital, discharge time will be determined by your treatment team.   Patients discharged the day of surgery will not be allowed to drive home, and someone needs to stay with them for 24 hours.  NO VISITORS WILL BE ALLOWED IN PRE-OP WHERE PATIENTS ARE PREPPED FOR SURGERY.  ONLY 1 SUPPORT PERSON MAY BE PRESENT IN THE WAITING ROOM WHILE YOU ARE IN SURGERY.  IF YOU ARE TO BE ADMITTED, ONCE YOU ARE IN YOUR ROOM YOU WILL BE ALLOWED TWO (2) VISITORS. 1 (ONE) VISITOR MAY STAY OVERNIGHT BUT MUST ARRIVE TO THE ROOM BY 8pm.  Minor children may have two parents present. Special consideration for safety and communication needs will be reviewed on a case by case basis.  Special instructions:    Oral Hygiene is also important to reduce your risk of infection.  Remember - BRUSH YOUR TEETH THE MORNING OF SURGERY WITH YOUR REGULAR TOOTHPASTE   [facility]- Preparing For Surgery  Before surgery, you can play an important role. Because skin is not sterile, your skin needs to be as free of germs as possible. You can reduce the number of germs on your skin by washing with CHG (chlorahexidine gluconate) Soap before surgery.  CHG is an antiseptic cleaner which kills germs and bonds with the skin to continue killing germs even after washing.     Please do not use if you have an allergy to CHG or antibacterial soaps. If your skin becomes reddened/irritated stop using the CHG.  Do not shave (including legs and underarms) for at least 48 hours prior to first CHG shower. It is OK to shave your face.  Please follow these instructions carefully.     Shower the NIGHT BEFORE SURGERY and the MORNING OF SURGERY with CHG Soap.   If you chose to wash your hair, wash your hair first as usual with your normal shampoo. After you shampoo, rinse your hair and body thoroughly to remove the shampoo.  Then [employer] and genitals (private parts) with your normal soap and  rinse thoroughly to remove soap.  After that Use CHG Soap as you would any other liquid soap. You can apply CHG directly to the skin and wash gently with a scrungie or a clean washcloth.   Apply the CHG Soap to your body ONLY FROM THE NECK DOWN.  Do not use on open wounds or open sores. Avoid contact with your eyes, ears, mouth and genitals (private parts). Wash Face and genitals (private parts)  with your normal soap.   Wash thoroughly, paying special attention to the area where your surgery will be performed.  Thoroughly rinse your body with warm water from the neck down.  DO NOT shower/wash with your normal soap after using and rinsing off the CHG Soap.  Pat yourself dry with a CLEAN TOWEL.  Wear CLEAN PAJAMAS to bed the night before surgery  Place  CLEAN SHEETS on your bed the night before your surgery  DO NOT SLEEP WITH PETS.   Day of Surgery:  Take a shower with CHG soap. Wear Clean/Comfortable clothing the morning of surgery Do not apply any deodorants/lotions.   Remember to brush your teeth WITH YOUR REGULAR TOOTHPASTE.   Please read over the following fact sheets that you were given.

## 2021-03-05 ENCOUNTER — Encounter (HOSPITAL_COMMUNITY)
Admission: RE | Admit: 2021-03-05 | Discharge: 2021-03-05 | Disposition: A | Discharge status: Home / Self Care | Payer: Medicare Other | Source: Ambulatory Visit | Attending: Thoracic Surgery (Cardiothoracic Vascular Surgery) | Admitting: Thoracic Surgery (Cardiothoracic Vascular Surgery)

## 2021-03-05 ENCOUNTER — Encounter (HOSPITAL_COMMUNITY): Payer: Self-pay

## 2021-03-05 ENCOUNTER — Other Ambulatory Visit: Payer: Self-pay

## 2021-03-05 ENCOUNTER — Ambulatory Visit (HOSPITAL_COMMUNITY)
Admission: RE | Admit: 2021-03-05 | Discharge: 2021-03-05 | Disposition: A | Discharge status: Home / Self Care | Payer: Medicare Other | Source: Ambulatory Visit | Attending: Thoracic Surgery (Cardiothoracic Vascular Surgery) | Admitting: Thoracic Surgery (Cardiothoracic Vascular Surgery)

## 2021-03-05 DIAGNOSIS — Z20822 Contact with and (suspected) exposure to covid-19: Secondary | ICD-10-CM | POA: Insufficient documentation

## 2021-03-05 DIAGNOSIS — R590 Localized enlarged lymph nodes: Secondary | ICD-10-CM | POA: Diagnosis not present

## 2021-03-05 DIAGNOSIS — Z01818 Encounter for other preprocedural examination: Secondary | ICD-10-CM | POA: Diagnosis not present

## 2021-03-05 DIAGNOSIS — E119 Type 2 diabetes mellitus without complications: Secondary | ICD-10-CM | POA: Diagnosis not present

## 2021-03-05 DIAGNOSIS — I4819 Other persistent atrial fibrillation: Secondary | ICD-10-CM | POA: Insufficient documentation

## 2021-03-05 LAB — COMPREHENSIVE METABOLIC PANEL
ALT: 16 U/L (ref 0–44)
AST: 22 U/L (ref 15–41)
Albumin: 4.1 g/dL (ref 3.5–5.0)
Alkaline Phosphatase: 96 U/L (ref 38–126)
Anion gap: 9 (ref 5–15)
BUN: 22 mg/dL (ref 8–23)
CO2: 24 mmol/L (ref 22–32)
Calcium: 9.8 mg/dL (ref 8.9–10.3)
Chloride: 103 mmol/L (ref 98–111)
Creatinine, Ser: 0.99 mg/dL (ref 0.61–1.24)
GFR, Estimated: >60 mL/min (ref >60)
Glucose, Bld: 205 mg/dL — ABNORMAL HIGH (ref 70–99)
Potassium: 4.4 mmol/L (ref 3.5–5.1)
Sodium: 136 mmol/L (ref 135–145)
Total Bilirubin: 0.8 mg/dL (ref 0.3–1.2)
Total Protein: 7.6 g/dL (ref 6.5–8.1)

## 2021-03-05 LAB — APTT: aPTT: 37 seconds — ABNORMAL HIGH (ref 24–36)

## 2021-03-05 LAB — CBC
HCT: 42.6 % (ref 39.0–52.0)
Hemoglobin: 13.6 g/dL (ref 13.0–17.0)
MCH: 29 pg (ref 26.0–34.0)
MCHC: 31.9 g/dL (ref 30.0–36.0)
MCV: 90.8 fL (ref 80.0–100.0)
Platelets: 113 10*3/uL — ABNORMAL LOW (ref 150–400)
RBC: 4.69 MIL/uL (ref 4.22–5.81)
RDW: 14.2 % (ref 11.5–15.5)
WBC: 7 10*3/uL (ref 4.0–10.5)
nRBC: 0 % (ref 0.0–0.2)

## 2021-03-05 LAB — PROTIME-INR
INR: 1.7 — ABNORMAL HIGH (ref 0.8–1.2)
Prothrombin Time: 20.3 seconds — ABNORMAL HIGH (ref 11.4–15.2)

## 2021-03-05 LAB — GLUCOSE, CAPILLARY: Glucose-Capillary: 213 mg/dL — ABNORMAL HIGH (ref 70–99)

## 2021-03-05 IMAGING — CR DG CHEST 2V
2 series · 2 of 2 positions shown · non-contrast
Comparison: CT Chest [DATE]; X-ray Chest [DATE].

CLINICAL DATA: Preop evaluation.

EXAM:
CHEST - 2 VIEW

[w chest pa]
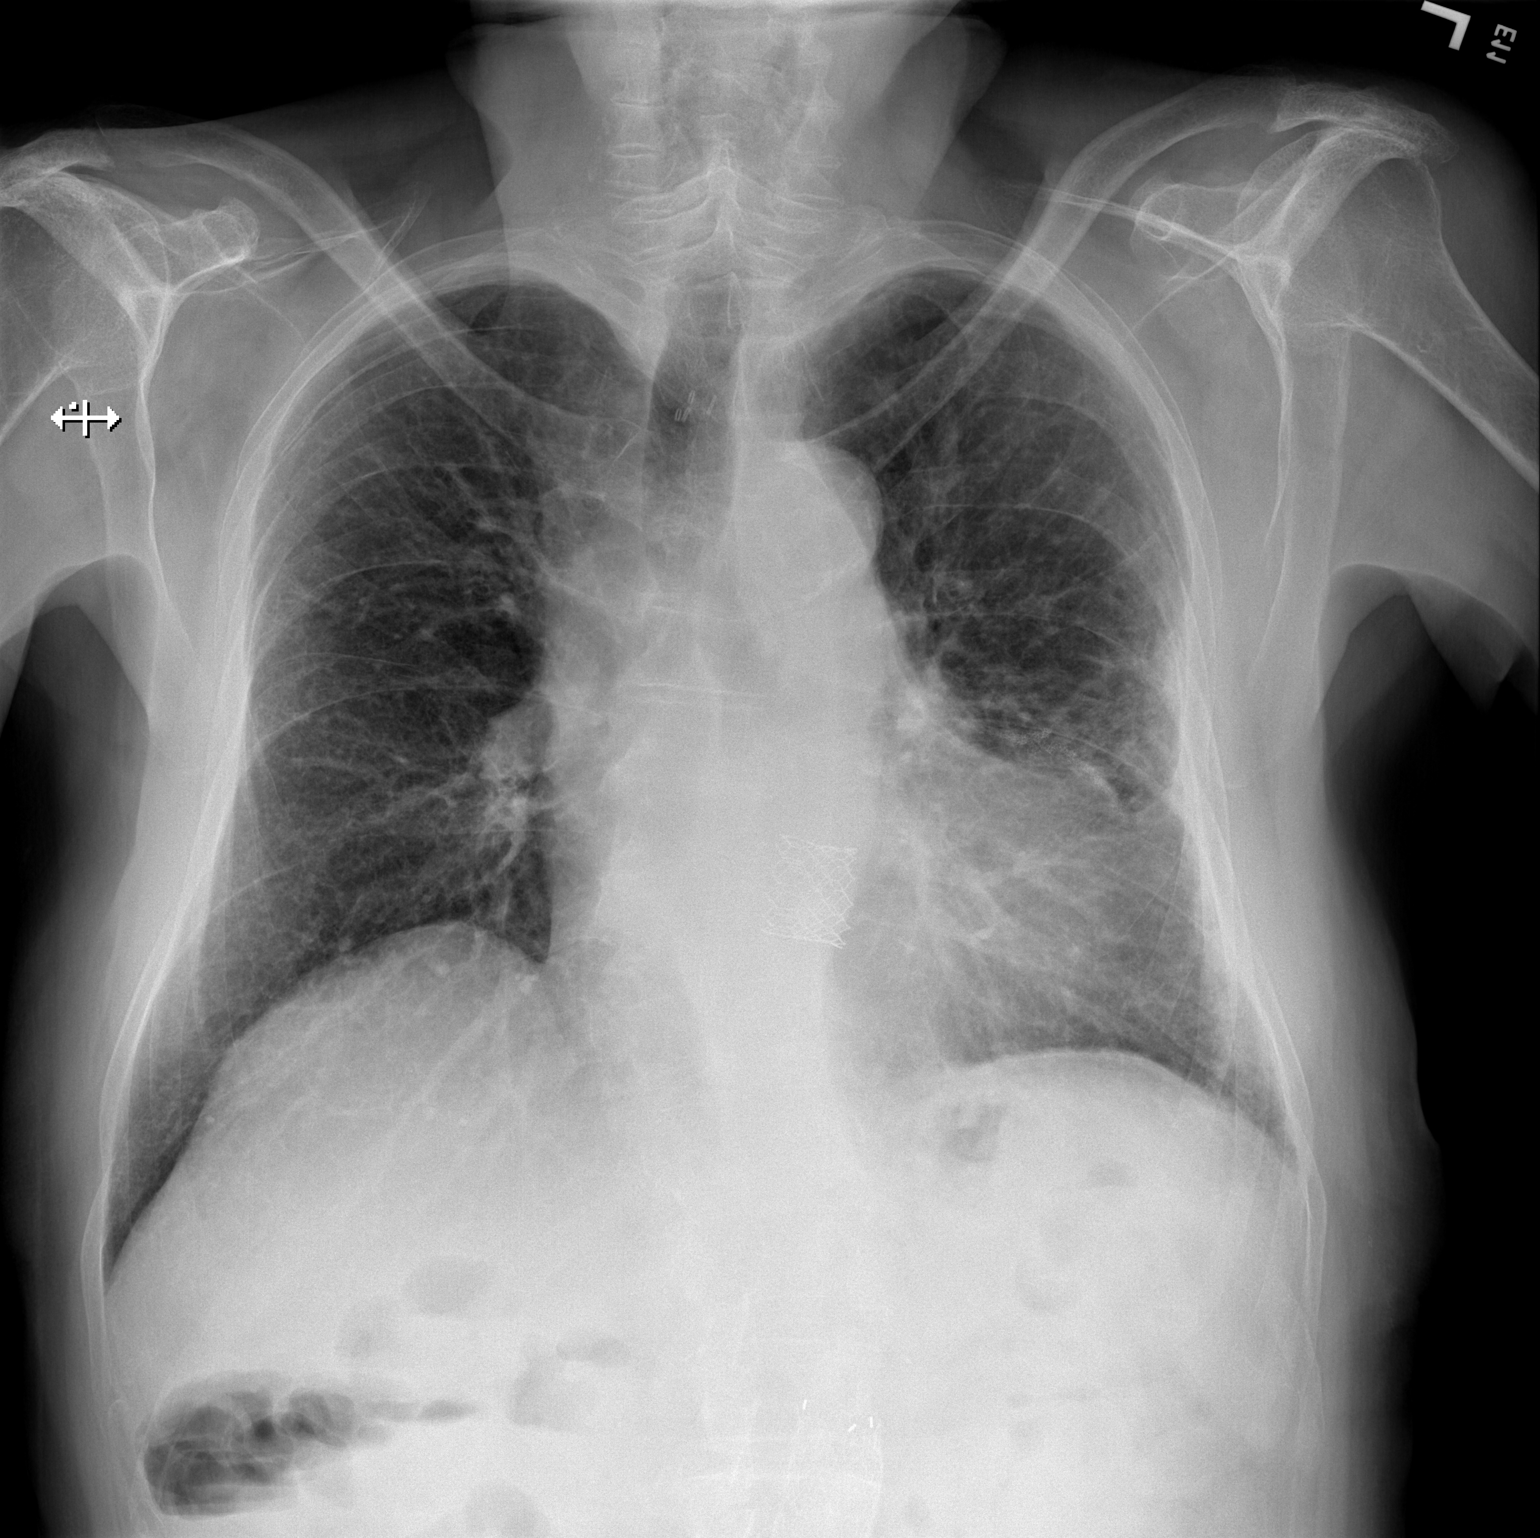

[w chest lat]
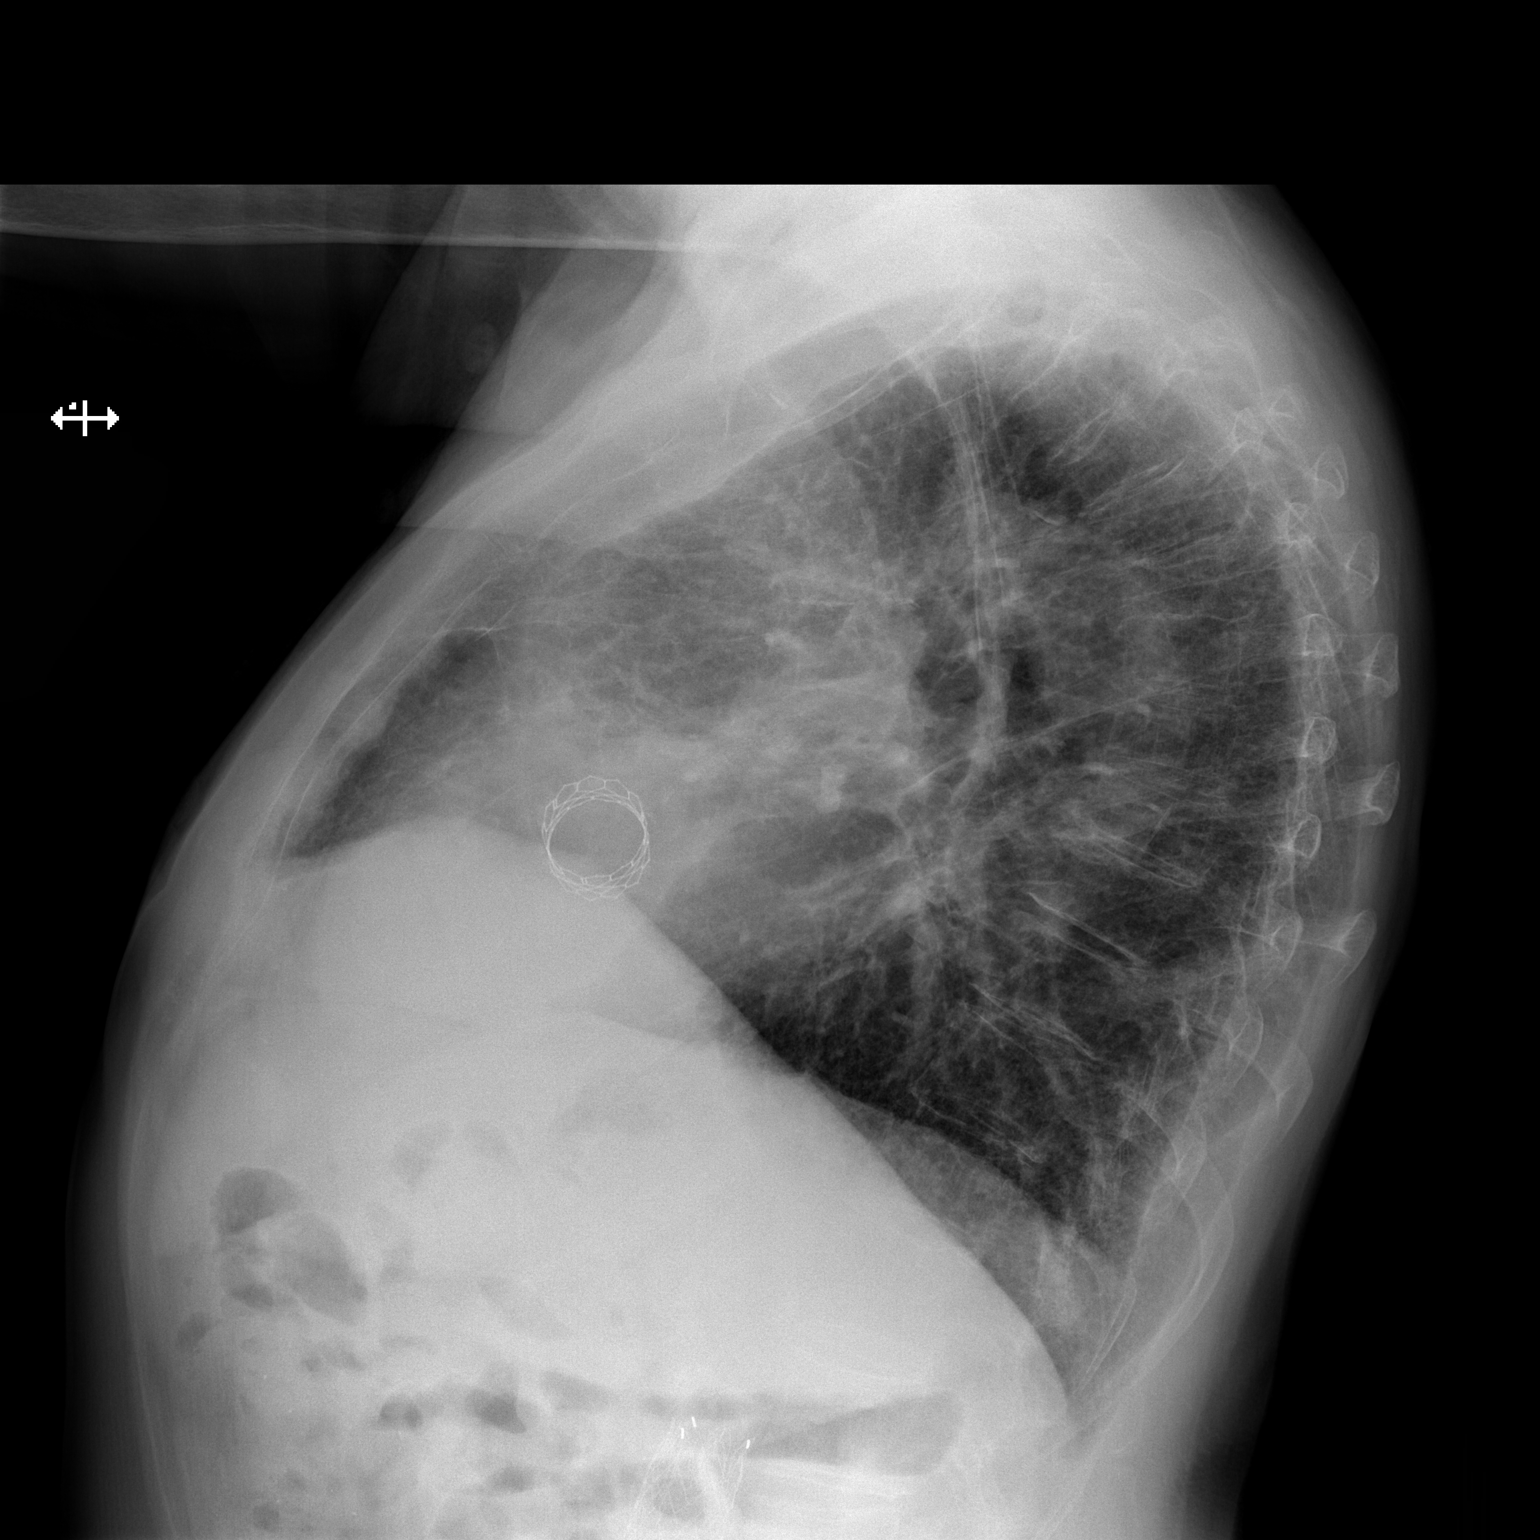

[2 of 2 positions shown; findings below may reference images not displayed]

FINDINGS: Stable cardiomediastinal contours. Heart size is normal. No pleural
effusion identified. Lingular opacity with pleural
thickening/irregularity overlying the lateral aspect of the left
midlung and left lower lobe is noted corresponding with the CT and
PET findings. No superimposed airspace consolidation. Kyphoscoliosis
deformity noted. Spondylosis noted within the thoracic spine.
IMPRESSION: 1. No acute findings.
2. Stable lingular opacity and pleural thickening overlying the
lateral left lung compatible with malignancy.

## 2021-03-05 NOTE — Progress Notes (Addendum)
PCP - [name] Cardiologist - [place] Last office visit 07/24/20 - supposed to follow up in 6 months Patient stated he wasn't sure he would return to see Dr. Marlou Porch  Chest x-ray - 03/05/21 EKG - 07/24/20 ECHO - 07/17/20 Cardiac Cath - 10/26/18  DM - Type 2 Fasting Blood Sugar - 140s   Blood Thinner Instructions: Last dose of Xarelto 03/04/21, per patient   ERAS Protcol - yes, no drink ordered or given   COVID TEST- 03/05/21 (Bronchoscopy)   Anesthesia review: yes, heart history See note from [facility] Raid (02/18/21) See note from [facility] (07/24/20)  Patient denies shortness of breath, fever, cough and chest pain at PAT appointment   All instructions explained to the patient, with a verbal understanding of the material. Patient agrees to go over the instructions while at home for a better understanding. Patient also instructed to self quarantine after being tested for COVID-19. The opportunity to ask questions was provided.

## 2021-03-06 LAB — SARS CORONAVIRUS 2 (TAT 6-24 HRS): SARS Coronavirus 2: NEGATIVE

## 2021-03-06 NOTE — Progress Notes (Signed)
Anesthesia Chart Review:   Case: [id number] Date/Time: 03/07/21 1345   Procedure: VIDEO BRONCHOSCOPY WITH ENDOBRONCHIAL ULTRASOUND   Anesthesia type: General   Pre-op diagnosis: MEDIASTINAL  AND LEFT HILAR ADENOPATHY   Location: [facility] OR ROOM 10 / [place] [facility]   Surgeons: [name], MD       DISCUSSION: Pt is 80 years old with hx CAD, persistent Afib, aortic stenosis (s/p TAVR 2020), AAA (s/p EVAR 2010), PAD, HTN, DM, COPD, lung cancer (s/p wedge resection 2020)  Last dose xarelto 03/04/21  - Glucose 205 - PT 20.3. Pt stopped xarelto 03/04/21, labs done 03/05/21. I notified Ryan in Dr. Leonarda Salon office of elevated PT.    VS: BP 115/71   Pulse 67   Temp 36.7 C (Oral)   Resp 18   Ht 5\' 9"  (1.753 m)   Wt 76.6 kg   SpO2 98%   BMI 24.93 kg/m    PROVIDERS: - PCP is [name], MD - Cardiologist is Candee Furbish, MD. Last office visit 07/24/20   LABS:  - Glucose 205 - PT 20.3. Pt stopped xarelto 03/04/21, labs done 03/05/21 - other labs acceptable for surgery  (all labs ordered are listed, but only abnormal results are displayed)  Labs Reviewed  GLUCOSE, CAPILLARY - Abnormal; Notable for the following components:      Result Value   Glucose-Capillary 213 (*)    All other components within normal limits  COMPREHENSIVE METABOLIC PANEL - Abnormal; Notable for the following components:   Glucose, Bld 205 (*)    All other components within normal limits  CBC - Abnormal; Notable for the following components:   Platelets 113 (*)    All other components within normal limits  PROTIME-INR - Abnormal; Notable for the following components:   Prothrombin Time 20.3 (*)    INR 1.7 (*)    All other components within normal limits  APTT - Abnormal; Notable for the following components:   aPTT 37 (*)    All other components within normal limits  SARS CORONAVIRUS 2 (TAT 6-24 HRS)     IMAGES: CXR 03/05/21: results pending   CT chest 02/12/21:  1. 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 PET-CT may be helpful for further evaluation. 2. Stable enlarged mediastinal and hilar lymph nodes. 3. Stable postoperative changes from a left upper lobe wedge resection. 4. Stable mild nodularity in the right middle lobe and stable chronic scarring changes in the right lung. 5. 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   EKG 07/24/20: atrial flutter 70 bpm   CV: Echo 07/17/20:  1. Left ventricular ejection fraction, by estimation, is 50 to 55%. The left ventricle has low normal function. The left ventricle has no regional wall motion abnormalities. There is mild concentric left ventricular hypertrophy. Left ventricular diastolic function could not be evaluated.  2. Right ventricular systolic function is normal. The right ventricular size is normal. There is normal pulmonary artery systolic pressure. The estimated right ventricular systolic pressure is 64.6 mmHg.  3. Left atrial size was severely dilated.  4. Right atrial size was mild to moderately dilated.  5. The mitral valve is normal in structure. Trivial mitral valve  regurgitation. No evidence of mitral stenosis.  6. The aortic valve has been repaired/replaced. Aortic valve regurgitation is mild to moderate. There is a 26 mm Sapien prosthetic (TAVR) valve present in the aortic position. Procedure Date: 12/06/2018. Aortic valve area, by VTI measures 1.26 cm.  Aortic  valve mean gradient measures 9.6 mmHg.  7. Aortic dilatation noted. There is mild dilatation of the ascending aorta, measuring 40 mm.  8. The inferior vena cava is normal in size with <50% respiratory variability, suggesting right atrial pressure of 8 mmHg.  - Comparison(s): No significant change from prior study.   EVAR [place] 07/04/20:  - Abdominal Aorta: Patent endovascular aneurysm repair with no evidence of endoleak. The largest aortic diameter remains essentially unchanged from prior  exam.    R/L cardiac cath 10/26/18:  Ost 2nd Mrg to 2nd Mrg lesion is 20% stenosed. Prox LAD lesion is 30% stenosed. Prox LAD to Mid LAD lesion is 30% stenosed. Prox RCA to Mid RCA lesion is 20% stenosed.   Past Medical History:  Diagnosis Date   AAA (abdominal aortic aneurysm)    Anxiety    Arthritis    Asthma    when younger   Atrial fibrillation (HCC)    CAD (coronary artery disease)    Cataract    removed both   COPD (chronic obstructive pulmonary disease) (HCC)    Diabetes mellitus    Type II   Dysrhythmia    afib   Esophageal reflux    Family history of malignant neoplasm of gastrointestinal tract    Heart murmur    Hiatal hernia    Hyperlipemia    Hypertension    Lung nodule    a. PET scan highly suspicious for malignancy. Bronch with biopsy to be done after TAVR   Malignant neoplasm of prostate ([place])    prostate    Peripheral vascular disease ([place])    Pneumonia    Stricture and stenosis of esophagus     Past Surgical History:  Procedure Laterality Date   ABDOMINAL AORTA STENT     CARDIAC CATHETERIZATION     CARDIOVERSION N/A 12/04/2015   Procedure: CARDIOVERSION;  Surgeon: Sanda Klein, MD;  Location: [facility];  Service: Cardiovascular;  Laterality: N/A;   COLONOSCOPY     FINGER SURGERY Right    middle finger- amputation   INSERTION PROSTATE RADIATION SEED     MEDIASTINOSCOPY N/A 01/06/2019   Procedure: MEDIASTINOSCOPY WITH BIOPIES;  Surgeon: Gaye Pollack, MD;  Location: [facility];  Service: Thoracic;  Laterality: N/A;   RIGHT/LEFT HEART CATH AND CORONARY ANGIOGRAPHY N/A 10/26/2018   Procedure: RIGHT/LEFT HEART CATH AND CORONARY ANGIOGRAPHY;  Surgeon: Burnell Blanks, MD;  Location: [facility];  Service: Cardiovascular;  Laterality: N/A;   TEE WITHOUT CARDIOVERSION N/A 12/06/2018   Procedure: TRANSESOPHAGEAL ECHOCARDIOGRAM (TEE);  Surgeon: Burnell Blanks, MD;  Location: [facility];  Service: Open Heart Surgery;  Laterality:  N/A;   TRANSCATHETER AORTIC VALVE REPLACEMENT, TRANSFEMORAL N/A 12/06/2018   Procedure: TRANSCATHETER AORTIC VALVE REPLACEMENT, TRANSFEMORAL;  Surgeon: Burnell Blanks, MD;  Location: [facility];  Service: Open Heart Surgery;  Laterality: N/A;   UPPER GASTROINTESTINAL ENDOSCOPY     VIDEO ASSISTED THORACOSCOPY (VATS)/THOROCOTOMY Left 02/02/2019   Procedure: VIDEO ASSISTED THORACOSCOPY (VATS)/THOROCOTOMY;  Surgeon: Gaye Pollack, MD;  Location: [facility];  Service: Thoracic;  Laterality: Left;   VIDEO BRONCHOSCOPY N/A 01/06/2019   Procedure: VIDEO BRONCHOSCOPY;  Surgeon: Gaye Pollack, MD;  Location: [facility];  Service: Thoracic;  Laterality: N/A;   WEDGE RESECTION Left 02/02/2019   Procedure: LUNG RESECTION;  Surgeon: Gaye Pollack, MD;  Location: [facility];  Service: Thoracic;  Laterality: Left;    MEDICATIONS:  acetaminophen (TYLENOL) 500 MG tablet   diltiazem (  CARDIZEM SR) 60 MG 12 hr capsule   dorzolamide-timolol (COSOPT) 22.3-6.8 MG/ML ophthalmic solution   glimepiride (AMARYL) 2 MG tablet   JARDIANCE 25 MG TABS tablet   metFORMIN (GLUCOPHAGE-XR) 500 MG 24 hr tablet   metoprolol succinate (TOPROL-XL) 50 MG 24 hr tablet   Multiple Vitamins-Minerals (MULTIVITAMIN WITH MINERALS) tablet   Multiple Vitamins-Minerals (ZINC PO)   pantoprazole (PROTONIX) 40 MG tablet   simvastatin (ZOCOR) 40 MG tablet   XARELTO 20 MG TABS tablet   No current facility-administered medications for this encounter.    If no changes, I anticipate pt can proceed with surgery as scheduled.   Willeen Cass, PhD, FNP-BC [facility]/Anesthesiology Phone: [phone] 03/06/2021 2:34 PM

## 2021-03-06 NOTE — Anesthesia Preprocedure Evaluation (Addendum)
Anesthesia Evaluation  Patient identified by MRN, date of birth, ID band Patient awake    Reviewed: Allergy & Precautions, NPO status , Patient's Chart, lab work & pertinent test results  Airway Mallampati: II  TM Distance: >3 FB Neck ROM: Full    Dental no notable dental hx.    Pulmonary COPD, Patient abstained from smoking., former smoker,  CT chest 02/12/21:  1. 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 PET-CT may be helpful for further evaluation. 2. Stable enlarged mediastinal and hilar lymph nodes. 3. Stable postoperative changes from a left upper lobe wedge resection. 4. Stable mild nodularity in the right middle lobe and stable chronic scarring changes in the right lung. 5. 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   Pulmonary exam normal breath sounds clear to auscultation       Cardiovascular hypertension, Pt. on medications + CAD and + Peripheral Vascular Disease  Normal cardiovascular exam+ dysrhythmias Atrial Fibrillation  Rhythm:Regular Rate:Normal  6 months follow up echo shows stable mild- mod PVL of TAVR valve. Gradients look good. EF 50-55%.  CV: Echo 07/17/20:  1. Left ventricular ejection fraction, by estimation, is 50 to 55%. The left ventricle has low normal function. The left ventricle has no regional wall motion abnormalities. There is mild concentric left ventricular hypertrophy. Left ventricular diastolic function could not be evaluated.  2. Right ventricular systolic function is normal. The right ventricular size is normal. There is normal pulmonary artery systolic pressure. The estimated right ventricular systolic pressure is 16.9 mmHg.  3. Left atrial size was severely dilated.  4. Right atrial size was mild to moderately dilated.  5. The mitral valve is normal in structure. Trivial mitral valve  regurgitation. No evidence of mitral  stenosis.  6. The aortic valve has been repaired/replaced. Aortic valve regurgitation is mild to moderate. There is a 26 mm Sapien prosthetic (TAVR) valve present in the aortic position. Procedure Date: 12/06/2018. Aortic valve area, by VTI measures 1.26 cm. Aortic valve mean gradient measures 9.6 mmHg.  7. Aortic dilatation noted. There is mild dilatation of the ascending aorta, measuring 40 mm.    Neuro/Psych Anxiety  Neuromuscular disease negative psych ROS   GI/Hepatic Neg liver ROS, hiatal hernia, GERD  Medicated and Controlled,  Endo/Other  diabetes, Type 2  Renal/GU negative Renal ROS  negative genitourinary   Musculoskeletal negative musculoskeletal ROS (+)   Abdominal   Peds negative pediatric ROS (+)  Hematology negative hematology ROS (+)   Anesthesia Other Findings   Reproductive/Obstetrics negative OB ROS                            Anesthesia Physical Anesthesia Plan  ASA: 4  Anesthesia Plan: General   Post-op Pain Management:    Induction: Intravenous  PONV Risk Score and Plan: 2 and Ondansetron and Dexamethasone  Airway Management Planned: Oral ETT  Additional Equipment: None  Intra-op Plan:   Post-operative Plan: Extubation in OR  Informed Consent:   Plan Discussed with: Anesthesiologist and CRNA  Anesthesia Plan Comments: (See APP note by Durel Salts, FNP  Pt is 80 years old with hx CAD, persistent Afib, aortic stenosis (s/p TAVR 2020), AAA (s/p EVAR 2010), PAD, HTN, DM, COPD, lung cancer (s/p wedge resection 2020))       Anesthesia Quick Evaluation

## 2021-03-07 ENCOUNTER — Ambulatory Visit (HOSPITAL_COMMUNITY)
Admission: RE | Admit: 2021-03-07 | Discharge: 2021-03-07 | Disposition: A | Discharge status: Home / Self Care | Payer: Medicare Other | Attending: Thoracic Surgery (Cardiothoracic Vascular Surgery) | Admitting: Thoracic Surgery (Cardiothoracic Vascular Surgery)

## 2021-03-07 ENCOUNTER — Encounter (HOSPITAL_COMMUNITY)
Admission: RE | Disposition: A | Discharge status: Home / Self Care | Payer: Self-pay | Source: Home / Self Care | Attending: Thoracic Surgery (Cardiothoracic Vascular Surgery)

## 2021-03-07 ENCOUNTER — Ambulatory Visit (HOSPITAL_COMMUNITY): Payer: Medicare Other | Admitting: Anesthesiology

## 2021-03-07 ENCOUNTER — Encounter (HOSPITAL_COMMUNITY): Payer: Self-pay | Admitting: Thoracic Surgery (Cardiothoracic Vascular Surgery)

## 2021-03-07 ENCOUNTER — Ambulatory Visit (HOSPITAL_COMMUNITY): Payer: Medicare Other | Admitting: Emergency Medicine

## 2021-03-07 ENCOUNTER — Other Ambulatory Visit: Payer: Self-pay

## 2021-03-07 DIAGNOSIS — C801 Malignant (primary) neoplasm, unspecified: Secondary | ICD-10-CM | POA: Diagnosis not present

## 2021-03-07 DIAGNOSIS — R590 Localized enlarged lymph nodes: Secondary | ICD-10-CM

## 2021-03-07 DIAGNOSIS — I10 Essential (primary) hypertension: Secondary | ICD-10-CM | POA: Diagnosis not present

## 2021-03-07 DIAGNOSIS — C3412 Malignant neoplasm of upper lobe, left bronchus or lung: Secondary | ICD-10-CM | POA: Insufficient documentation

## 2021-03-07 DIAGNOSIS — Z952 Presence of prosthetic heart valve: Secondary | ICD-10-CM | POA: Diagnosis not present

## 2021-03-07 DIAGNOSIS — I4819 Other persistent atrial fibrillation: Secondary | ICD-10-CM | POA: Diagnosis not present

## 2021-03-07 DIAGNOSIS — Z8546 Personal history of malignant neoplasm of prostate: Secondary | ICD-10-CM | POA: Insufficient documentation

## 2021-03-07 DIAGNOSIS — K219 Gastro-esophageal reflux disease without esophagitis: Secondary | ICD-10-CM | POA: Diagnosis not present

## 2021-03-07 DIAGNOSIS — C771 Secondary and unspecified malignant neoplasm of intrathoracic lymph nodes: Secondary | ICD-10-CM | POA: Diagnosis not present

## 2021-03-07 DIAGNOSIS — J449 Chronic obstructive pulmonary disease, unspecified: Secondary | ICD-10-CM | POA: Diagnosis not present

## 2021-03-07 DIAGNOSIS — Z923 Personal history of irradiation: Secondary | ICD-10-CM | POA: Insufficient documentation

## 2021-03-07 DIAGNOSIS — I4891 Unspecified atrial fibrillation: Secondary | ICD-10-CM | POA: Insufficient documentation

## 2021-03-07 DIAGNOSIS — I2510 Atherosclerotic heart disease of native coronary artery without angina pectoris: Secondary | ICD-10-CM | POA: Diagnosis not present

## 2021-03-07 DIAGNOSIS — Z7901 Long term (current) use of anticoagulants: Secondary | ICD-10-CM | POA: Insufficient documentation

## 2021-03-07 DIAGNOSIS — E119 Type 2 diabetes mellitus without complications: Secondary | ICD-10-CM | POA: Diagnosis not present

## 2021-03-07 DIAGNOSIS — Z902 Acquired absence of lung [part of]: Secondary | ICD-10-CM | POA: Diagnosis not present

## 2021-03-07 DIAGNOSIS — Z87891 Personal history of nicotine dependence: Secondary | ICD-10-CM | POA: Insufficient documentation

## 2021-03-07 DIAGNOSIS — E785 Hyperlipidemia, unspecified: Secondary | ICD-10-CM | POA: Diagnosis not present

## 2021-03-07 DIAGNOSIS — I700 Atherosclerosis of aorta: Secondary | ICD-10-CM | POA: Insufficient documentation

## 2021-03-07 DIAGNOSIS — Z7984 Long term (current) use of oral hypoglycemic drugs: Secondary | ICD-10-CM | POA: Insufficient documentation

## 2021-03-07 DIAGNOSIS — Z79899 Other long term (current) drug therapy: Secondary | ICD-10-CM | POA: Diagnosis not present

## 2021-03-07 DIAGNOSIS — C3491 Malignant neoplasm of unspecified part of right bronchus or lung: Secondary | ICD-10-CM | POA: Diagnosis not present

## 2021-03-07 DIAGNOSIS — E1151 Type 2 diabetes mellitus with diabetic peripheral angiopathy without gangrene: Secondary | ICD-10-CM | POA: Insufficient documentation

## 2021-03-07 DIAGNOSIS — C3402 Malignant neoplasm of left main bronchus: Secondary | ICD-10-CM | POA: Diagnosis not present

## 2021-03-07 HISTORY — PX: VIDEO BRONCHOSCOPY WITH ENDOBRONCHIAL ULTRASOUND: SHX6177

## 2021-03-07 LAB — GLUCOSE, CAPILLARY: Glucose-Capillary: 157 mg/dL — ABNORMAL HIGH (ref 70–99)

## 2021-03-07 SURGERY — BRONCHOSCOPY, WITH EBUS
Anesthesia: General

## 2021-03-07 MED ORDER — PROPOFOL 10 MG/ML IV BOLUS
INTRAVENOUS | Status: DC | PRN
  Administered 2021-03-07: 130 mg via INTRAVENOUS

## 2021-03-07 MED ORDER — ONDANSETRON HCL 4 MG/2ML IJ SOLN
INTRAMUSCULAR | Status: AC
  Filled 2021-03-07: qty 2

## 2021-03-07 MED ORDER — FENTANYL CITRATE (PF) 250 MCG/5ML IJ SOLN
INTRAMUSCULAR | Status: AC
  Filled 2021-03-07: qty 5

## 2021-03-07 MED ORDER — PROPOFOL 10 MG/ML IV BOLUS
INTRAVENOUS | Status: AC
  Filled 2021-03-07: qty 20

## 2021-03-07 MED ORDER — FENTANYL CITRATE (PF) 100 MCG/2ML IJ SOLN: 25.0000 ug | INTRAMUSCULAR | Status: DC | PRN

## 2021-03-07 MED ORDER — FENTANYL CITRATE (PF) 250 MCG/5ML IJ SOLN
INTRAMUSCULAR | Status: DC | PRN
  Administered 2021-03-07: 50 ug via INTRAVENOUS

## 2021-03-07 MED ORDER — OXYCODONE HCL 5 MG/5ML PO SOLN
5.0000 mg | Freq: Once | ORAL | Status: DC | PRN
Start: 2021-03-07 — End: 2021-03-07

## 2021-03-07 MED ORDER — OXYCODONE HCL 5 MG PO TABS: 5.0000 mg | ORAL_TABLET | Freq: Once | ORAL | Status: DC | PRN

## 2021-03-07 MED ORDER — ROCURONIUM BROMIDE 10 MG/ML (PF) SYRINGE
PREFILLED_SYRINGE | INTRAVENOUS | Status: DC | PRN
  Administered 2021-03-07: 50 mg via INTRAVENOUS

## 2021-03-07 MED ORDER — DEXAMETHASONE SODIUM PHOSPHATE 10 MG/ML IJ SOLN
INTRAMUSCULAR | Status: DC | PRN
  Administered 2021-03-07: 10 mg via INTRAVENOUS

## 2021-03-07 MED ORDER — EPINEPHRINE PF 1 MG/ML IJ SOLN
INTRAMUSCULAR | Status: AC
  Filled 2021-03-07: qty 1

## 2021-03-07 MED ORDER — EPINEPHRINE PF 1 MG/ML IJ SOLN
INTRAMUSCULAR | Status: DC | PRN
  Administered 2021-03-07: 1 mg

## 2021-03-07 MED ORDER — ONDANSETRON HCL 4 MG/2ML IJ SOLN
INTRAMUSCULAR | Status: DC | PRN
  Administered 2021-03-07: 4 mg via INTRAVENOUS

## 2021-03-07 MED ORDER — 0.9 % SODIUM CHLORIDE (POUR BTL) OPTIME
TOPICAL | Status: DC | PRN
  Administered 2021-03-07: 1000 mL

## 2021-03-07 MED ORDER — LACTATED RINGERS IV SOLN: INTRAVENOUS | Status: DC

## 2021-03-07 MED ORDER — LIDOCAINE 2% (20 MG/ML) 5 ML SYRINGE
INTRAMUSCULAR | Status: AC
  Filled 2021-03-07: qty 5

## 2021-03-07 MED ORDER — CHLORHEXIDINE GLUCONATE 0.12 % MT SOLN
15.0000 mL | Freq: Once | OROMUCOSAL | Status: AC
  Administered 2021-03-07: 15 mL via OROMUCOSAL
  Filled 2021-03-07: qty 15

## 2021-03-07 MED ORDER — ACETAMINOPHEN 325 MG PO TABS: 325.0000 mg | ORAL_TABLET | ORAL | Status: DC | PRN

## 2021-03-07 MED ORDER — SUGAMMADEX SODIUM 200 MG/2ML IV SOLN
INTRAVENOUS | Status: DC | PRN
  Administered 2021-03-07 (×3): 100 mg via INTRAVENOUS

## 2021-03-07 MED ORDER — PHENYLEPHRINE HCL-NACL 20-0.9 MG/250ML-% IV SOLN
INTRAVENOUS | Status: AC
  Filled 2021-03-07: qty 250

## 2021-03-07 MED ORDER — DEXAMETHASONE SODIUM PHOSPHATE 10 MG/ML IJ SOLN
INTRAMUSCULAR | Status: AC
  Filled 2021-03-07: qty 1

## 2021-03-07 MED ORDER — MEPERIDINE HCL 25 MG/ML IJ SOLN: 6.2500 mg | INTRAMUSCULAR | Status: DC | PRN

## 2021-03-07 MED ORDER — ORAL CARE MOUTH RINSE: 15.0000 mL | Freq: Once | OROMUCOSAL | Status: AC

## 2021-03-07 MED ORDER — ACETAMINOPHEN 160 MG/5ML PO SOLN: 325.0000 mg | ORAL | Status: DC | PRN

## 2021-03-07 MED ORDER — ONDANSETRON HCL 4 MG/2ML IJ SOLN: 4.0000 mg | Freq: Once | INTRAMUSCULAR | Status: DC | PRN

## 2021-03-07 MED ORDER — LIDOCAINE 2% (20 MG/ML) 5 ML SYRINGE
INTRAMUSCULAR | Status: DC | PRN
Start: 2021-03-07 — End: 2021-03-07
  Administered 2021-03-07: 100 mg via INTRAVENOUS

## 2021-03-07 MED ORDER — ROCURONIUM BROMIDE 10 MG/ML (PF) SYRINGE
PREFILLED_SYRINGE | INTRAVENOUS | Status: AC
  Filled 2021-03-07: qty 10

## 2021-03-07 SURGICAL SUPPLY — 41 items
ADAPTER VALVE BIOPSY EBUS (MISCELLANEOUS) IMPLANT
ADPTR VALVE BIOPSY EBUS (MISCELLANEOUS)
BLADE CLIPPER SURG (BLADE) ×1 IMPLANT
BRUSH CYTOL CELLEBRITY 1.5X140 (MISCELLANEOUS) ×1 IMPLANT
CANISTER SUCT 3000ML PPV (MISCELLANEOUS) ×2 IMPLANT
CNTNR URN SCR LID CUP LEK RST (MISCELLANEOUS) ×1 IMPLANT
CONT SPEC 4OZ STRL OR WHT (MISCELLANEOUS) ×2
COVER BACK TABLE 60X90IN (DRAPES) ×2 IMPLANT
FILTER STRAW FLUID ASPIR (MISCELLANEOUS) ×1 IMPLANT
FORCEPS BIOP RJ4 1.8 (CUTTING FORCEPS) IMPLANT
FORCEPS RADIAL JAW LRG 4 PULM (INSTRUMENTS) IMPLANT
GAUZE SPONGE 4X4 12PLY STRL (GAUZE/BANDAGES/DRESSINGS) ×1 IMPLANT
GLOVE SURG SIGNA 7.5 PF LTX (GLOVE) ×2 IMPLANT
GOWN STRL REUS W/ TWL XL LVL3 (GOWN DISPOSABLE) ×1 IMPLANT
GOWN STRL REUS W/TWL XL LVL3 (GOWN DISPOSABLE) ×2
KIT CLEAN ENDO COMPLIANCE (KITS) ×4 IMPLANT
KIT TURNOVER KIT B (KITS) ×2 IMPLANT
MARKER SKIN DUAL TIP RULER LAB (MISCELLANEOUS) ×2 IMPLANT
NDL ASPIRATION VIZISHOT 19G (NEEDLE) IMPLANT
NDL ASPIRATION VIZISHOT 21G (NEEDLE) ×1 IMPLANT
NDL BLUNT 18X1 FOR OR ONLY (NEEDLE) IMPLANT
NEEDLE ASPIRATION VIZISHOT 19G (NEEDLE) ×2 IMPLANT
NEEDLE ASPIRATION VIZISHOT 21G (NEEDLE) IMPLANT
NEEDLE BLUNT 18X1 FOR OR ONLY (NEEDLE) IMPLANT
NS IRRIG 1000ML POUR BTL (IV SOLUTION) ×2 IMPLANT
OIL SILICONE PENTAX (PARTS (SERVICE/REPAIRS)) ×2 IMPLANT
PAD ARMBOARD 7.5X6 YLW CONV (MISCELLANEOUS) ×4 IMPLANT
RADIAL JAW LRG 4 PULMONARY (INSTRUMENTS)
SYR 20ML ECCENTRIC (SYRINGE) ×2 IMPLANT
SYR 20ML LL LF (SYRINGE) ×2 IMPLANT
SYR 3ML LL SCALE MARK (SYRINGE) IMPLANT
SYR 5ML LL (SYRINGE) ×2 IMPLANT
SYR 5ML LUER SLIP (SYRINGE) ×2 IMPLANT
TOWEL GREEN STERILE (TOWEL DISPOSABLE) ×2 IMPLANT
TOWEL GREEN STERILE FF (TOWEL DISPOSABLE) ×2 IMPLANT
TRAP SPECIMEN MUCUS 40CC (MISCELLANEOUS) ×2 IMPLANT
TUBE CONNECTING 20X1/4 (TUBING) ×2 IMPLANT
VALVE BIOPSY  SINGLE USE (MISCELLANEOUS) ×2
VALVE BIOPSY SINGLE USE (MISCELLANEOUS) ×1 IMPLANT
VALVE SUCTION BRONCHIO DISP (MISCELLANEOUS) ×2 IMPLANT
WATER STERILE IRR 1000ML POUR (IV SOLUTION) ×2 IMPLANT

## 2021-03-07 NOTE — Transfer of Care (Signed)
Immediate Anesthesia Transfer of Care Note  Patient: Vincent Velez  Procedure(s) Performed: VIDEO BRONCHOSCOPY WITH ENDOBRONCHIAL ULTRASOUND  Patient Location: PACU  Anesthesia Type:General  Level of Consciousness: drowsy and patient cooperative  Airway & Oxygen Therapy: Patient Spontanous Breathing and Patient connected to face mask oxygen  Post-op Assessment: Report given to RN and Post -op Vital signs reviewed and stable  Post vital signs: Reviewed and stable  Last Vitals:  Vitals Value Taken Time  BP 149/90 03/07/21 1442  Temp 36.7 C 03/07/21 1442  Pulse 80 03/07/21 1443  Resp 12 03/07/21 1443  SpO2 95 % 03/07/21 1443  Vitals shown include unvalidated device data.  Last Pain:  Vitals:   03/07/21 1226  TempSrc: Oral  PainSc:          Complications: No notable events documented.

## 2021-03-07 NOTE — Anesthesia Postprocedure Evaluation (Signed)
Anesthesia Post Note  Patient: Vincent Velez  Procedure(s) Performed: VIDEO BRONCHOSCOPY WITH ENDOBRONCHIAL ULTRASOUND     Patient location during evaluation: PACU Anesthesia Type: General Level of consciousness: awake and alert Pain management: pain level controlled Vital Signs Assessment: post-procedure vital signs reviewed and stable Respiratory status: spontaneous breathing, nonlabored ventilation, respiratory function stable and patient connected to nasal cannula oxygen Cardiovascular status: blood pressure returned to baseline and stable Postop Assessment: no apparent nausea or vomiting Anesthetic complications: no   No notable events documented.  Last Vitals:  Vitals:   03/07/21 1442 03/07/21 1450  BP: (!) 149/90   Pulse: 90 89  Resp: 14 15  Temp: 36.7 C   SpO2: 99% 96%    Last Pain:  Vitals:   03/07/21 1442  TempSrc:   PainSc: 0-No pain                 Zerick Prevette

## 2021-03-07 NOTE — Discharge Instructions (Addendum)
Do not drive or engage in heavy physical activity for 24 hours  You may resume normal activities tomorrow  Resume Xarelto tomorrow  You may cough up small amounts of blood over the next few days  You may use acetaminophen (Tylenol) if needed for discomfort. You may use an over-the-counter cough medication if needed.  Call [phone] if you develop chest pain, shortness of breath or fever > 101 F  My office will contact you with follow up information

## 2021-03-07 NOTE — Interval H&P Note (Signed)
History and Physical Interval Note:  03/07/2021 1:21 PM  [name]  has presented today for surgery, with the diagnosis of MEDIASTINAL  AND LEFT HILAR ADENOPATHY.  The various methods of treatment have been discussed with the patient and family. After consideration of risks, benefits and other options for treatment, the patient has consented to  Procedure(s): Vincent Velez (N/A) as a surgical intervention.  The patient's history has been reviewed, patient examined, no change in status, stable for surgery.  I have reviewed the patient's chart and labs.  Questions were answered to the patient's satisfaction.     [name]

## 2021-03-07 NOTE — Brief Op Note (Signed)
03/07/2021  2:52 PM  PATIENT:  Vincent Velez  80 y.o. male  PRE-OPERATIVE DIAGNOSIS:  MEDIASTINAL  AND LEFT HILAR ADENOPATHY  POST-OPERATIVE DIAGNOSIS:  MEDIASTINAL  AND LEFT HILAR ADENOPATHY, RECURRENT NON-SMALL CELL CARCINOMA  PROCEDURE:  Procedure(s): VIDEO BRONCHOSCOPY WITH ENDOBRONCHIAL ULTRASOUND (N/A) Mediastina and hilar lymph node needle aspirations  SURGEON:  Surgeon(s) and Role:    * [name], MD - Primary  PHYSICIAN ASSISTANT:   ASSISTANTS: none   ANESTHESIA:   general  EBL:  20 mL   BLOOD ADMINISTERED:none  DRAINS: none   LOCAL MEDICATIONS USED:  NONE  SPECIMEN:  Source of Specimen:  Level 7 and 11R nodes  DISPOSITION OF SPECIMEN:  PATHOLOGY  COUNTS:  NO endo  TOURNIQUET:  * No tourniquets in log *  DICTATION: .Other Dictation: Dictation Number -  PLAN OF CARE: Discharge to home after PACU  PATIENT DISPOSITION:  PACU - hemodynamically stable.   Delay start of Pharmacological VTE agent (>24hrs) due to surgical blood loss or risk of bleeding: not applicable

## 2021-03-07 NOTE — Anesthesia Procedure Notes (Signed)
Procedure Name: Intubation Date/Time: 03/07/2021 1:42 PM Performed by: Lowella Dell, CRNA Pre-anesthesia Checklist: Patient identified, Emergency Drugs available, Suction available and Patient being monitored Patient Re-evaluated:Patient Re-evaluated prior to induction Oxygen Delivery Method: Circle System Utilized Preoxygenation: Pre-oxygenation with 100% oxygen Induction Type: IV induction Ventilation: Mask ventilation without difficulty and Oral airway inserted - appropriate to patient size Laryngoscope Size: Mac and 4 Grade View: Grade I Tube type: Oral Tube size: 8.5 mm Number of attempts: 1 Airway Equipment and Method: Stylet and Oral airway Placement Confirmation: ETT inserted through vocal cords under direct vision, positive ETCO2 and breath sounds checked- equal and bilateral Secured at: 20 cm Tube secured with: Tape Dental Injury: Teeth and Oropharynx as per pre-operative assessment

## 2021-03-08 ENCOUNTER — Encounter (HOSPITAL_COMMUNITY): Payer: Self-pay | Admitting: Thoracic Surgery (Cardiothoracic Vascular Surgery)

## 2021-03-08 NOTE — Op Note (Signed)
NAME: Vincent Velez, Vincent Velez MEDICAL RECORD NO: [id number] ACCOUNT NO: [id number] DATE OF BIRTH: 09-Mar-1941 FACILITY: [facility] LOCATION: [facility] PHYSICIAN: Revonda Standard. Roxan Hockey, MD  Operative Report   DATE OF PROCEDURE: 03/07/2021  PREOPERATIVE DIAGNOSIS:  Mediastinal and hilar adenopathy.  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  PROCEDURE: Bronchoscopy and endobronchial ultrasound with mediastinal and hilar needle aspirations.  SURGEON: Jarmel Linhardt. Roxan Hockey, MD  ASSISTANT:  None.  ANESTHESIA:  General.  FINDINGS:  Quick prep from the level 7 and 11R nodes deemed adequate for diagnosis suspicious for non-small cell lung cancer.  CLINICAL NOTE:  Vincent Velez is a 80 year old man with history of a stage IB adenocarcinoma of the lung, who had a wedge resection in 2020.  Recently he had a followup, which showed evidence of metastatic disease involving the pleura on PET CT.  Those  areas were hypermetabolic and also there was a hypermetabolic left hilar node and mildly hypermetabolic subcarinal node.  The patient was advised to undergo bronchoscopy and endobronchial ultrasound for diagnosis and staging purposes.  The indications, risks,  benefits, and alternatives were discussed in detail with the patient.  He understood and accepted the risks and agreed to proceed.  DESCRIPTION OF PROCEDURE:  Vincent Velez was brought to the operating room on 03/07/2021.  He had induction of general anesthesia and was intubated.  Sequential compression devices were placed on the calves for DVT prophylaxis.  Bair Hugger was placed for  active warming.  A timeout was performed.  Flexible fiberoptic bronchoscopy was performed via the endotracheal tube.  It revealed normal endobronchial anatomy with no endobronchial lesions to the level of the subsegmental bronchi.  There were some thick clear secretions,  which cleared with saline and the  bronchoscope was removed.  The endobronchial ultrasound probe was advanced.  Level 11L and level 7 nodes were both identifiable.  Though they both were slightly larger than normal, neither appeared particularly pathologically enlarged.  Both did appear approachable. Needle aspirations  then were performed of both nodes.  The level 7 node was done first.  Multiple aspirations were obtained some with and some without suction. All aspirations were done with realtime ultrasound visualization and approximately 15 passes were made in the  node with each aspiration.  The first three passes were applied to slides and then the remaining aspirations were placed into cytologic preparation fluid for cell block.  There was some mild bleeding.  Topical epinephrine was used topically to irrigate and help control  the bleeding.  The scope then was advanced to the carina of the left main stem and the level 11L node was identified.  Multiple aspirations were obtained from this node, again both with and without suction applied.  Again the first three  specimens were placed on the slides and then the remainder were placed into cytologic preparation fluid. Quick prep on both lymph nodes showed adequate tissue for diagnosis consistent with non-small cell carcinoma.  The bronchoscope was replaced and final  inspection was made.  There was no ongoing bleeding.  The bronchoscope was removed.  The patient was extubated in the operating room and taken to postanesthetic care unit in good condition.   VAI D: 03/07/2021 4:29:43 pm T: 03/08/2021 12:18:00 am  JOB: [id number]/ [id number]

## 2021-03-10 ENCOUNTER — Telehealth: Payer: Self-pay | Admitting: *Deleted

## 2021-03-10 DIAGNOSIS — R911 Solitary pulmonary nodule: Secondary | ICD-10-CM

## 2021-03-10 LAB — GLUCOSE, CAPILLARY: Glucose-Capillary: 117 mg/dL — ABNORMAL HIGH (ref 70–99)

## 2021-03-10 NOTE — Telephone Encounter (Signed)
I received referral on Mr. Vincent Velez today. I called and updated him on his appt to be seen on 10/12.  He verbalized understanding of appt time and place.

## 2021-03-11 LAB — CYTOLOGY - NON PAP

## 2021-03-11 NOTE — Progress Notes (Signed)
[place] [phone]   Fax:([phone]) [phone]  CONSULT NOTE  REFERRING PHYSICIAN: Dr. Roxan Hockey   REASON FOR CONSULTATION:  Recurrent Non-Small Cell Lung Cancer    HPI Vincent Velez is a 80 y.o. male with a past medical history significant for persistent atrial fibrillation, aortic aneurysm, prostate adenocarcinoma s/p radiation, peripheral vascular disease, hypertension, COPD, GERD, and diabetes is referred to the clinic for recurrent non-small cell lung cancer, adenocarcinoma.   The patient was initially diagnosed in September 2020.  The patient underwent a left muscle-sparing thoracotomy with wedge resection of a left upper lobe nodule on 02/01/2021 under the care of Dr. Cyndia Bent.  The patient had negative margins and the final pathology at that time was consistent with T2 a, N0, stage Ib adenocarcinoma.  The patient had been on observation until September 2022 and he was having a routine follow-up visit with Dr. Cyndia Bent.  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  There is also some stable but enlarged mediastinal and hilar lymph nodes.  He underwent a PET scan on 02/14/2021 to further characterize this which showed hypermetabolism along the pleural surface of the left hemithorax as well as hypermetabolic metastatic adenopathy in the mediastinum and left hilum.  The patient then saw Dr. Roxan Hockey on 9/29 for consideration of endobronchial ultrasound and biopsy. This was performed on 03/07/2021.  The final pathology [id number] ) was consistent with metastatic non-small cell lung cancer.  The morphology and IHC favored adenocarcinoma. The 7L and 11 L lymph nodes were positive for carcinoma.  The patient was referred to the clinic for further evaluation and management regarding this finding.  Overall, the patient is feeling fairly well today. He  notes for the last several years he has had some intermittent dizziness, mostly in the morning.  He has some chronic baseline shortness of breath with exertion that is not significantly changed.  He denies any recent fever, chills, night sweats, or recent unexplained weight loss.  Over the last two years he dropped from 190 to 170 lbs. He reports denies cough.  He denies any chest pain or hemoptysis.  He denies any nausea, vomiting, or constipation. He sometimes has diarrhea but denies anything constant. He denies any headache or visual changes.  His father had metastatic cancer in the 23's when the patient was 80 years old. He is unsure of the primary cancer. His mother had dementia, diabetes, heart problems, hypertension, glaucoma, and cataracts. His brother also has prostate cancer.   He used to be in the [employer]. 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 He is a widow. He has one living child, Colletta [place], who accompanied him to his appointment today. He has two other children who passed away. He smoked pipes approximately 55 years intermittently. He quit 2 years ago. He estimates that he drinks a 6 pack of beer per month. He denies drug use.   HPI  Past Medical History:  Diagnosis Date   AAA (abdominal aortic aneurysm)    Anxiety    Arthritis    Asthma    when younger   Atrial fibrillation (HCC)    CAD (coronary artery disease)    Cataract    removed both   COPD (chronic obstructive pulmonary disease) (HCC)    Diabetes mellitus    Type II   Dysrhythmia    afib   Esophageal  reflux    Family history of malignant neoplasm of gastrointestinal tract    Heart murmur    Hiatal hernia    Hyperlipemia    Hypertension    Lung nodule    a. PET scan highly suspicious for malignancy. Bronch with biopsy to be done after TAVR   Malignant neoplasm of prostate ([facility])    prostate    Peripheral vascular disease ([facility])    Pneumonia    Stricture and stenosis of esophagus      Past Surgical History:  Procedure Laterality Date   ABDOMINAL AORTA STENT     CARDIAC CATHETERIZATION     CARDIOVERSION N/A 12/04/2015   Procedure: CARDIOVERSION;  Surgeon: Sanda Klein, MD;  Location: [facility];  Service: Cardiovascular;  Laterality: N/A;   COLONOSCOPY     FINGER SURGERY Right    middle finger- amputation   INSERTION PROSTATE RADIATION SEED     MEDIASTINOSCOPY N/A 01/06/2019   Procedure: MEDIASTINOSCOPY WITH BIOPIES;  Surgeon: Gaye Pollack, MD;  Location: [facility];  Service: Thoracic;  Laterality: N/A;   RIGHT/LEFT HEART CATH AND CORONARY ANGIOGRAPHY N/A 10/26/2018   Procedure: RIGHT/LEFT HEART CATH AND CORONARY ANGIOGRAPHY;  Surgeon: Burnell Blanks, MD;  Location: [facility];  Service: Cardiovascular;  Laterality: N/A;   TEE WITHOUT CARDIOVERSION N/A 12/06/2018   Procedure: TRANSESOPHAGEAL ECHOCARDIOGRAM (TEE);  Surgeon: Burnell Blanks, MD;  Location: [facility];  Service: Open Heart Surgery;  Laterality: N/A;   TRANSCATHETER AORTIC VALVE REPLACEMENT, TRANSFEMORAL N/A 12/06/2018   Procedure: TRANSCATHETER AORTIC VALVE REPLACEMENT, TRANSFEMORAL;  Surgeon: Burnell Blanks, MD;  Location: [facility];  Service: Open Heart Surgery;  Laterality: N/A;   UPPER GASTROINTESTINAL ENDOSCOPY     VIDEO ASSISTED THORACOSCOPY (VATS)/THOROCOTOMY Left 02/02/2019   Procedure: VIDEO ASSISTED THORACOSCOPY (VATS)/THOROCOTOMY;  Surgeon: Gaye Pollack, MD;  Location: [facility];  Service: Thoracic;  Laterality: Left;   VIDEO BRONCHOSCOPY N/A 01/06/2019   Procedure: VIDEO BRONCHOSCOPY;  Surgeon: Gaye Pollack, MD;  Location: [facility];  Service: Thoracic;  Laterality: N/A;   VIDEO BRONCHOSCOPY WITH ENDOBRONCHIAL ULTRASOUND N/A 03/07/2021   Procedure: VIDEO BRONCHOSCOPY WITH ENDOBRONCHIAL ULTRASOUND;  Surgeon: [name], MD;  Location: [facility];  Service: Thoracic;  Laterality: N/A;   WEDGE RESECTION Left 02/02/2019   Procedure: LUNG RESECTION;   Surgeon: Gaye Pollack, MD;  Location: [facility];  Service: Thoracic;  Laterality: Left;    Family History  Problem Relation Age of Onset   Colon cancer Father        questionable   Diabetes Mother    Heart disease Mother        Heart Disease before age 23   Deep vein thrombosis Mother    Hyperlipidemia Mother    Hypertension Mother    Varicose Veins Mother    Hypertension Brother    Heart attack Brother    Prostate cancer Brother    Colon polyps Neg Hx    Esophageal cancer Neg Hx    Rectal cancer Neg Hx    Stomach cancer Neg Hx     Social History Social History   Tobacco Use   Smoking status: Former    Types: Pipe, Cigars    Quit date: 12/2018    Years since quitting: 2.1   Smokeless tobacco: Never  Vaping Use   Vaping Use: Never used  Substance Use Topics   Alcohol use: Not Currently    Alcohol/week: 0.0 standard drinks    Comment:  occ   Drug use: No    Allergies  Allergen Reactions   Macrolides And Ketolides Other (See Comments)    Pt unsure of this     Current Outpatient Medications  Medication Sig Dispense Refill   acetaminophen (TYLENOL) 500 MG tablet Take 2 tablets (1,000 mg total) by mouth every 6 (six) hours as needed for mild pain or fever. 30 tablet 0   diltiazem (CARDIZEM SR) 60 MG 12 hr capsule Take 1 capsule (60 mg total) by mouth every 12 (twelve) hours. 60 capsule 7   dorzolamide-timolol (COSOPT) 22.3-6.8 MG/ML ophthalmic solution Place 1 drop into both eyes in the morning, at noon, and at bedtime.     glimepiride (AMARYL) 2 MG tablet Take 2 mg by mouth daily with breakfast.     JARDIANCE 25 MG TABS tablet Take 25 mg by mouth daily.     metFORMIN (GLUCOPHAGE-XR) 500 MG 24 hr tablet Take 1,000 mg by mouth every evening.      metoprolol succinate (TOPROL-XL) 50 MG 24 hr tablet Take 50 mg by mouth daily.     Multiple Vitamins-Minerals (MULTIVITAMIN WITH MINERALS) tablet Take 1 tablet by mouth daily.     Multiple Vitamins-Minerals (ZINC PO) Take 1  tablet by mouth daily.     pantoprazole (PROTONIX) 40 MG tablet Take 40 mg by mouth daily.     simvastatin (ZOCOR) 40 MG tablet Take 40 mg by mouth at bedtime.     XARELTO 20 MG TABS tablet TAKE 1 TABLET BY MOUTH  DAILY WITH SUPPER 90 tablet 1   No current facility-administered medications for this visit.    REVIEW OF SYSTEMS:   Review of Systems  Constitutional: Negative for appetite change, chills, fatigue, fever and unexpected weight change.  HENT: Negative for mouth sores, nosebleeds, sore throat and trouble swallowing.   Eyes: Negative for eye problems and icterus.  Respiratory: Positive for baseline dyspnea on exertion. Negative for cough, hemoptysis, shortness of breath and wheezing.   Cardiovascular: Negative for chest pain and leg swelling.  Gastrointestinal: Negative for abdominal pain, constipation, diarrhea, nausea and vomiting.  Genitourinary: Negative for bladder incontinence, difficulty urinating, dysuria, frequency and hematuria.   Musculoskeletal: Negative for back pain, gait problem, neck pain and neck stiffness.  Skin: Negative for itching and rash.  Neurological: Negative for dizziness, extremity weakness, gait problem, headaches, light-headedness and seizures.  Hematological: Negative for adenopathy. Does not bruise/bleed easily.  Psychiatric/Behavioral: Negative for confusion, depression and sleep disturbance. The patient is not nervous/anxious.     PHYSICAL EXAMINATION:  Blood pressure 112/79, pulse 88, temperature (!) 97.2 F (36.2 C), temperature source Tympanic, resp. rate 19, height 5' 9" (1.753 m), weight 167 lb 9.6 oz (76 kg), SpO2 99 %.  ECOG PERFORMANCE STATUS: 1  Physical Exam  Constitutional: Oriented to person, place, and time and well-developed, well-nourished, and in no distress.  HENT:  Head: Normocephalic and atraumatic.  Mouth/Throat: Oropharynx is clear and moist. No oropharyngeal exudate.  Eyes: Conjunctivae are normal. Right eye exhibits no  discharge. Left eye exhibits no discharge. No scleral icterus.  Neck: Normal range of motion. Neck supple.  Cardiovascular: Normal rate, regular rhythm, normal heart sounds and intact distal pulses.   Pulmonary/Chest: Effort normal and breath sounds normal. No respiratory distress. No wheezes. No rales.  Abdominal: Soft. Bowel sounds are normal. Exhibits no distension and no mass. There is no tenderness.  Musculoskeletal: Normal range of motion. Exhibits no edema.  Lymphadenopathy:    No cervical adenopathy.  Neurological:  Alert and oriented to person, place, and time. Exhibits normal muscle tone. Gait normal. Coordination normal.  Skin: Skin is warm and dry. No rash noted. Not diaphoretic. No erythema. No pallor.  Psychiatric: Mood, memory and judgment normal.  Vitals reviewed.  LABORATORY DATA: Lab Results  Component Value Date   WBC 8.8 03/12/2021   HGB 12.4 (L) 03/12/2021   HCT 38.6 (L) 03/12/2021   MCV 89.6 03/12/2021   PLT 135 (L) 03/12/2021      Chemistry      Component Value Date/Time   NA 142 03/12/2021 1219   NA 138 12/06/2019 1438   K 4.2 03/12/2021 1219   CL 111 03/12/2021 1219   CO2 23 03/12/2021 1219   BUN 18 03/12/2021 1219   BUN 14 12/06/2019 1438   CREATININE 0.95 03/12/2021 1219   CREATININE 0.81 11/29/2015 1335      Component Value Date/Time   CALCIUM 9.8 03/12/2021 1219   ALKPHOS 91 03/12/2021 1219   AST 20 03/12/2021 1219   ALT 17 03/12/2021 1219   BILITOT 0.8 03/12/2021 1219       RADIOGRAPHIC STUDIES: DG Chest 2 View  Result Date: 03/10/2021 CLINICAL DATA:  Preop evaluation. EXAM: CHEST - 2 VIEW COMPARISON:  CT Chest 02/12/2021; X-ray Chest 02/22/2019. FINDINGS: Stable cardiomediastinal contours. Heart size is normal. No pleural effusion identified. Lingular opacity with pleural thickening/irregularity overlying the lateral aspect of the left midlung and left lower lobe is noted corresponding with the CT and PET findings. No superimposed  airspace consolidation. Kyphoscoliosis deformity noted. Spondylosis noted within the thoracic spine. IMPRESSION: 1. No acute findings. 2. Stable lingular opacity and pleural thickening overlying the lateral left lung compatible with malignancy. Electronically Signed   By: Kerby Moors M.D.   On: 03/10/2021 13:10   CT CHEST WO CONTRAST  Result Date: 02/12/2021 CLINICAL DATA:  Followup pulmonary nodules. History of prior lung cancer status post wedge resection of a left upper lobe lung mass in 2020. EXAM: CT CHEST WITHOUT CONTRAST TECHNIQUE: Multidetector CT imaging of the chest was performed following the standard protocol without IV contrast. COMPARISON:  Multiple prior CT scans.  The most recent is 08/07/2020 FINDINGS: Cardiovascular: The heart is normal in size. No pericardial effusion. Stable tortuosity and calcification of the thoracic aorta. Stable three-vessel coronary artery calcifications. Mediastinum/Nodes: Stable enlarged mediastinal and hilar lymph nodes. No new or progressive findings. The esophagus is grossly normal. Lungs/Pleura: Stable postoperative changes from a left upper lobe wedge resection. 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 There are also scattered parenchymal nodules most notably in the left upper lobe inferiorly. Stable appearing mild nodularity in the right middle lobe and stable chronic scarring changes in the right lung. No definite metastatic pulmonary nodules in the right lung. No pleural effusions. Upper Abdomen: No significant upper abdominal findings. Stable vascular calcifications. 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 Musculoskeletal: No significant bony findings. Stable scoliosis and degenerative changes but no worrisome bone lesions. IMPRESSION: 1. Interval development of extensive metastatic  pleural disease involving the entire left hemithorax. PET-CT may be helpful for further evaluation. 2. Stable enlarged mediastinal and hilar lymph nodes. 3. Stable postoperative changes from a left upper lobe wedge resection. 4. Stable mild nodularity in the right middle lobe and stable chronic scarring changes in the right lung. 5. 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  Aortic Atherosclerosis (ICD10-I70.0) and Emphysema (ICD10-J43.9). Electronically Signed   By: Marijo Sanes M.D.   On: 02/12/2021 12:01   NM PET Image Restag (PS) Skull Base To Thigh  Result Date: 02/16/2021 CLINICAL DATA:  Subsequent treatment strategy for lung cancer. History of left upper lobe wedge resection for neoplasm. EXAM: NUCLEAR MEDICINE PET SKULL BASE TO THIGH TECHNIQUE: 8.5 mCi F-18 FDG was injected intravenously. Full-ring PET imaging was performed from the skull base to thigh after the radiotracer. CT data was obtained and used for attenuation correction and anatomic localization. Fasting blood glucose: 176 mg/dl COMPARISON:  Chest CT 02/12/2021.  PET-CT 11/10/2018 FINDINGS: Mediastinal blood pool activity: SUV max 2.9 Liver activity: SUV max NA NECK: No hypermetabolic lymph nodes in the neck. Incidental CT findings: none CHEST: Mediastinal and left hilar lymphadenopathy evident. Not all of the mediastinal lymph nodes demonstrate hypermetabolism. A left hilar node measuring 14 mm short axis on image 64/series 4 is hypermetabolic with SUV max = 4.8. Focal hypermetabolism in the left hilum with SUV max = 8.2 suggest metastatic disease although no discrete lymph node evident at this location on today's exam without contrast. Low level hypermetabolism is seen relatively diffusely along the left pleura. Nodular pleuroparenchymal left lower lobe opacity measuring 11 mm on image 66/4 demonstrates SUV max = 3.7. Incidental CT findings: Status post TAVR. There is mild atherosclerotic calcification of the abdominal  aorta without aneurysm. Pleural and fissural nodularity seen on the previous CT is again noted, similar. Tiny left pleural effusion. ABDOMEN/PELVIS: No abnormal hypermetabolic activity within the liver, pancreas, adrenal glands, or spleen. No hypermetabolic lymph nodes in the abdomen or pelvis. Incidental CT findings: 18 mm left adrenal nodule is stable, previously characterized as adenoma. There is low level FDG accumulation in the nodule today but this can be seen in the setting of adenoma. Aortic endograft noted. Brachytherapy seeds evident in the prostate gland. SKELETON: No focal hypermetabolic activity to suggest skeletal metastasis. Incidental CT findings: No worrisome lytic or sclerotic osseous abnormality. IMPRESSION: 1. Hypermetabolic metastatic lymphadenopathy in the mediastinum and left hilum. 2. Low level diffuse FDG accumulation along the pleural surfaces of the left hemithorax suspicious for metastatic involvement. 3. No evidence for hypermetabolic metastatic disease in the abdomen or pelvis. 4. 18 mm left adrenal nodule remains stable, previously characterized as adenoma. Electronically Signed   By: Misty Stanley M.D.   On: 02/16/2021 09:22    ASSESSMENT: This is a very pleasant 80 year old Caucasian male with recurrent non-small cell lung cancer, adenocarcinoma initially diagnosed as stage IB.  He initially presented with a left upper lobe nodule status post wedge resection in September 2022.  The patient had evidence of disease recurrence with mediastinal and left hilar adenopathy and pleural-based disease in September 2022.   PLAN: The patient was seen with Dr. Julien Nordmann today.  Dr. Julien Nordmann had a lengthy discussion today with the patient the patient's current condition and recommended treatment options. Dr. Julien Nordmann discussed that given that his disease involves the pleura, this makes his disease stage IV. We will test the patient to see if he has any actionable mutations and will request PDL1  and foundation one testing. If positive, we will discuss targeted treatment.  If the patient is negative for any actionable mutations, Dr. Julien Nordmann discussed treatment is generally palliative systemic chemotherapy and immunotherapy with carboplatin for an AUC 5, Alimta 500 mg per metered squared, Keytruda 200 mg IV every 3 weeks.    However, we will wait for the results of  his molecular studies and will see him back in 3 weeks for evaluation and a more detailed discussion about his recommended treatment option once we have all the results back.   If the patient does not have enough tissue for foundation one testing, we will consider the patient for guardant 360 molecular testing.   The patient voices understanding of current disease status and treatment options and is in agreement with the current care plan.  All questions were answered. The patient knows to call the clinic with any problems, questions or concerns. We can certainly see the patient much sooner if necessary.  Thank you so much for allowing me to participate in the care of MYKLE PASCUA. I will continue to follow up the patient with you and assist in his care.   Disclaimer: This note was dictated with voice recognition software. Similar sounding words can inadvertently be transcribed and may not be corrected upon review.   Tenzin Pavon L Everline Mahaffy March 12, 2021, 2:13 PM  ADDENDUM: Hematology/Oncology Attending: I had a face-to-face encounter with the patient today.  I reviewed his record, lab, scan and recommended his care plan.  This is a very pleasant 80 years old white male with multiple medical problems including atrial fibrillation, aortic aneurysm, prostate cancer status post radiotherapy as well as peripheral vascular disease, COPD, diabetes and GERD.  The patient also has a history of stage Ib non-small cell lung cancer, adenocarcinoma diagnosed in September 2020 status post wedge resection of left upper lobe nodule  on February 02, 2019 under the care of Dr. Nadean Corwin and the final pathology showed T2 a, N0 adenocarcinoma. The patient was followed by routine imaging studies under the care of Dr. Nadean Corwin but CT scan of the chest on 02/12/2021 showed interval development of extensive metastatic pleural disease involving the entire left hemothorax with stable enlarged mediastinal and hilar lymph nodes.  He also has a stable mild nodularity in the right middle lobe concerning for chronic scarring.  A PET scan on 02/14/2021 showed hypermetabolic metastatic lymphadenopathy in the mediastinum and left hilum in addition to low-level diffuse FDG accumulation along the pleural surfaces of the left hemothorax suspicious for metastatic involvement.  There was no evidence of hypermetabolic metastatic disease in the abdomen or pelvis.  The 1.8 cm left adrenal nodule remained stable and previously characterized as adenoma. The patient underwent bronchoscopy with endobronchial ultrasound with mediastinal and hilar needle aspirations under the care of Dr. Roxan Hockey on 03/07/2021. The final pathology [id number]) showed the fine-needle aspiration of the lymph nodes from level 7 as well as level 11 L had malignant cells consistent with non-small cell carcinoma favoring adenocarcinoma.  Immunohistochemistry shows the malignant cells are positive with TTF-1, focally positive with cytokeratin 5/6 and negative for p63. The immunophenotype is most consistent with adenocarcinoma. Dr. Roxan Hockey kindly referred the patient to me today for evaluation and recommendation regarding treatment of his condition. When seen today he is feeling fine with no concerning complaints. I had a lengthy discussion with the patient and his grandson today about his current condition and possible treatment options. I personally and independently reviewed his scan images and discussed the results with the patient today. I recommended for the patient to complete the  staging work-up by ordering MRI of the brain to rule out brain metastasis. I explained to the patient that he has incurable condition and all the treatment will be of palliative nature. I will request his tissue block to be sent to [facility] for molecular  studies and PD-L1 expression. If the patient has an actionable mutation, he would be treated with targeted therapy but if he has no actionable mutation, I will discuss with the patient other treatment options including palliative systemic chemotherapy with immunotherapy. The patient is interested in waiting until the molecular studies becomes available before we proceed with any treatment. I will see him back for follow-up visit in around 3 weeks for evaluation and more detailed discussion of his treatment options based on the molecular studies. He was advised to call immediately if he has any other concerning symptoms in the interval. The total time spent in the appointment was 60 minutes.  Disclaimer: This note was dictated with voice recognition software. Similar sounding words can inadvertently be transcribed and may be missed upon review. Eilleen Kempf, MD 03/12/21

## 2021-03-12 ENCOUNTER — Encounter: Payer: Self-pay | Admitting: Physician Assistant

## 2021-03-12 ENCOUNTER — Other Ambulatory Visit: Payer: Self-pay

## 2021-03-12 ENCOUNTER — Inpatient Hospital Stay: Payer: Medicare Other

## 2021-03-12 ENCOUNTER — Inpatient Hospital Stay: Payer: Medicare Other | Attending: Physician Assistant | Admitting: Physician Assistant

## 2021-03-12 ENCOUNTER — Encounter: Payer: Self-pay | Admitting: *Deleted

## 2021-03-12 VITALS — BP 112/79 | HR 88 | Temp 97.2°F | Resp 19 | Ht 69.0 in | Wt 167.6 lb

## 2021-03-12 DIAGNOSIS — R911 Solitary pulmonary nodule: Secondary | ICD-10-CM

## 2021-03-12 DIAGNOSIS — M47814 Spondylosis without myelopathy or radiculopathy, thoracic region: Secondary | ICD-10-CM | POA: Insufficient documentation

## 2021-03-12 DIAGNOSIS — Z8349 Family history of other endocrine, nutritional and metabolic diseases: Secondary | ICD-10-CM | POA: Diagnosis not present

## 2021-03-12 DIAGNOSIS — Z7901 Long term (current) use of anticoagulants: Secondary | ICD-10-CM | POA: Diagnosis not present

## 2021-03-12 DIAGNOSIS — I739 Peripheral vascular disease, unspecified: Secondary | ICD-10-CM

## 2021-03-12 DIAGNOSIS — J439 Emphysema, unspecified: Secondary | ICD-10-CM | POA: Insufficient documentation

## 2021-03-12 DIAGNOSIS — J449 Chronic obstructive pulmonary disease, unspecified: Secondary | ICD-10-CM

## 2021-03-12 DIAGNOSIS — Z923 Personal history of irradiation: Secondary | ICD-10-CM | POA: Diagnosis not present

## 2021-03-12 DIAGNOSIS — R197 Diarrhea, unspecified: Secondary | ICD-10-CM | POA: Insufficient documentation

## 2021-03-12 DIAGNOSIS — I4819 Other persistent atrial fibrillation: Secondary | ICD-10-CM

## 2021-03-12 DIAGNOSIS — M4180 Other forms of scoliosis, site unspecified: Secondary | ICD-10-CM | POA: Diagnosis not present

## 2021-03-12 DIAGNOSIS — Z87891 Personal history of nicotine dependence: Secondary | ICD-10-CM | POA: Diagnosis not present

## 2021-03-12 DIAGNOSIS — I10 Essential (primary) hypertension: Secondary | ICD-10-CM | POA: Insufficient documentation

## 2021-03-12 DIAGNOSIS — Z7189 Other specified counseling: Secondary | ICD-10-CM | POA: Insufficient documentation

## 2021-03-12 DIAGNOSIS — C771 Secondary and unspecified malignant neoplasm of intrathoracic lymph nodes: Secondary | ICD-10-CM | POA: Diagnosis not present

## 2021-03-12 DIAGNOSIS — C3412 Malignant neoplasm of upper lobe, left bronchus or lung: Secondary | ICD-10-CM | POA: Insufficient documentation

## 2021-03-12 DIAGNOSIS — I700 Atherosclerosis of aorta: Secondary | ICD-10-CM | POA: Insufficient documentation

## 2021-03-12 DIAGNOSIS — Z800 Family history of malignant neoplasm of digestive organs: Secondary | ICD-10-CM | POA: Insufficient documentation

## 2021-03-12 DIAGNOSIS — I2510 Atherosclerotic heart disease of native coronary artery without angina pectoris: Secondary | ICD-10-CM | POA: Diagnosis not present

## 2021-03-12 DIAGNOSIS — Z833 Family history of diabetes mellitus: Secondary | ICD-10-CM | POA: Insufficient documentation

## 2021-03-12 DIAGNOSIS — C61 Malignant neoplasm of prostate: Secondary | ICD-10-CM | POA: Insufficient documentation

## 2021-03-12 DIAGNOSIS — I4891 Unspecified atrial fibrillation: Secondary | ICD-10-CM | POA: Diagnosis not present

## 2021-03-12 DIAGNOSIS — R0609 Other forms of dyspnea: Secondary | ICD-10-CM | POA: Diagnosis not present

## 2021-03-12 DIAGNOSIS — C3490 Malignant neoplasm of unspecified part of unspecified bronchus or lung: Secondary | ICD-10-CM | POA: Insufficient documentation

## 2021-03-12 DIAGNOSIS — Z8042 Family history of malignant neoplasm of prostate: Secondary | ICD-10-CM

## 2021-03-12 DIAGNOSIS — E119 Type 2 diabetes mellitus without complications: Secondary | ICD-10-CM | POA: Diagnosis not present

## 2021-03-12 DIAGNOSIS — Z8249 Family history of ischemic heart disease and other diseases of the circulatory system: Secondary | ICD-10-CM | POA: Insufficient documentation

## 2021-03-12 DIAGNOSIS — K219 Gastro-esophageal reflux disease without esophagitis: Secondary | ICD-10-CM | POA: Insufficient documentation

## 2021-03-12 DIAGNOSIS — Z8546 Personal history of malignant neoplasm of prostate: Secondary | ICD-10-CM | POA: Diagnosis not present

## 2021-03-12 DIAGNOSIS — R590 Localized enlarged lymph nodes: Secondary | ICD-10-CM | POA: Insufficient documentation

## 2021-03-12 DIAGNOSIS — C3492 Malignant neoplasm of unspecified part of left bronchus or lung: Secondary | ICD-10-CM

## 2021-03-12 LAB — CBC WITH DIFFERENTIAL (CANCER CENTER ONLY)
Abs Immature Granulocytes: 0.09 10*3/uL — ABNORMAL HIGH (ref 0.00–0.07)
Basophils Absolute: 0.1 10*3/uL (ref 0.0–0.1)
Basophils Relative: 1 %
Eosinophils Absolute: 0.3 10*3/uL (ref 0.0–0.5)
Eosinophils Relative: 4 %
HCT: 38.6 % — ABNORMAL LOW (ref 39.0–52.0)
Hemoglobin: 12.4 g/dL — ABNORMAL LOW (ref 13.0–17.0)
Immature Granulocytes: 1 %
Lymphocytes Relative: 28 %
Lymphs Abs: 2.4 10*3/uL (ref 0.7–4.0)
MCH: 28.8 pg (ref 26.0–34.0)
MCHC: 32.1 g/dL (ref 30.0–36.0)
MCV: 89.6 fL (ref 80.0–100.0)
Monocytes Absolute: 0.9 10*3/uL (ref 0.1–1.0)
Monocytes Relative: 11 %
Neutro Abs: 4.9 10*3/uL (ref 1.7–7.7)
Neutrophils Relative %: 55 %
Platelet Count: 135 10*3/uL — ABNORMAL LOW (ref 150–400)
RBC: 4.31 MIL/uL (ref 4.22–5.81)
RDW: 14.6 % (ref 11.5–15.5)
Smear Review: NORMAL
WBC Count: 8.8 10*3/uL (ref 4.0–10.5)
nRBC: 0 % (ref 0.0–0.2)

## 2021-03-12 LAB — CMP (CANCER CENTER ONLY)
ALT: 17 U/L (ref 0–44)
AST: 20 U/L (ref 15–41)
Albumin: 4.1 g/dL (ref 3.5–5.0)
Alkaline Phosphatase: 91 U/L (ref 38–126)
Anion gap: 8 (ref 5–15)
BUN: 18 mg/dL (ref 8–23)
CO2: 23 mmol/L (ref 22–32)
Calcium: 9.8 mg/dL (ref 8.9–10.3)
Chloride: 111 mmol/L (ref 98–111)
Creatinine: 0.95 mg/dL (ref 0.61–1.24)
GFR, Estimated: >60 mL/min (ref >60)
Glucose, Bld: 172 mg/dL — ABNORMAL HIGH (ref 70–99)
Potassium: 4.2 mmol/L (ref 3.5–5.1)
Sodium: 142 mmol/L (ref 135–145)
Total Bilirubin: 0.8 mg/dL (ref 0.3–1.2)
Total Protein: 7.7 g/dL (ref 6.5–8.1)

## 2021-03-12 NOTE — Patient Instructions (Addendum)
Summary:  -There are two main categories of lung cancer, they are named based on the size of the cancer cell. One is called Non-Small cell lung cancer. 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 This is the most common type of lung cancer.  -So far, the disease is just involved in the lining/side of the chest and lymph nodes. Even though it is small volume of disease, because it is involved in the lining of the lung, it does automatically make it stage IV.  -We covered a lot of important information at your appointment today regarding what the treatment plan is moving forward. Here are the the main points that were discussed at your office visit with [place] today:  -We are going to try to send off special tests on the tissue that Dr. Roxan Hockey got from the procedure/bronchoscopy. This will help [place] see what treatment that you are candidate for. We are looking for genetic markers with these special tests. If you have a marker that we have pills for, treatment would be a pill. This takes about 10-14 days to come back. If they don't have enough of a sample to run the test, we may call you back to do a blood test.  -If you do not have a marker, generally treatment for stage IV is chemotherapy/immunotherapy. But we will wait until we have the results of the marker to discuss this.   Referrals or Imaging: -We need a brain MRI to just make sure that nothing has spread to this area. They will call you from the imaging department. I would expect this in about 7-10 days. Their number is [phone]   Follow up:  -We will see you back for a follow up visit 3 week after we have all of these test results so we know the treatment.  -If you need to reach [place] at any time, the main office number to the cancer center is [phone], when you call, ask to speak to either Cassie's or Dr.  Worthy Flank nurse.

## 2021-03-12 NOTE — Progress Notes (Signed)
Per Dr. Julien Nordmann, I notified pathology to send recent biopsy for molecular and PDl 1 testing to [facility]

## 2021-03-13 ENCOUNTER — Telehealth: Payer: Self-pay | Admitting: Physician Assistant

## 2021-03-13 NOTE — Telephone Encounter (Signed)
Sch per 10/12los pt aware

## 2021-03-20 ENCOUNTER — Ambulatory Visit (HOSPITAL_COMMUNITY)
Admission: RE | Admit: 2021-03-20 | Discharge: 2021-03-20 | Disposition: A | Discharge status: Home / Self Care | Payer: Medicare Other | Source: Ambulatory Visit | Attending: Physician Assistant | Admitting: Physician Assistant

## 2021-03-20 ENCOUNTER — Other Ambulatory Visit: Payer: Self-pay

## 2021-03-20 DIAGNOSIS — I639 Cerebral infarction, unspecified: Secondary | ICD-10-CM | POA: Diagnosis not present

## 2021-03-20 DIAGNOSIS — C3490 Malignant neoplasm of unspecified part of unspecified bronchus or lung: Secondary | ICD-10-CM | POA: Diagnosis not present

## 2021-03-20 DIAGNOSIS — C3492 Malignant neoplasm of unspecified part of left bronchus or lung: Secondary | ICD-10-CM | POA: Insufficient documentation

## 2021-03-20 DIAGNOSIS — I6782 Cerebral ischemia: Secondary | ICD-10-CM | POA: Diagnosis not present

## 2021-03-20 IMAGING — MR MR HEAD WO/W CM
13 series · 48 of 48 positions shown · IV contrast (gadavist)
Comparison: No previous brain imaging.

CLINICAL DATA: Metastatic disease evaluation. Recurrent lung
cancer.

EXAM:
MRI HEAD WITHOUT AND WITH CONTRAST
TECHNIQUE: Multiplanar, multiecho pulse sequences of the brain and surrounding
structures were obtained without and with intravenous contrast.
CONTRAST:  8mL GADAVIST GADOBUTROL 1 MMOL/ML IV SOLN

[Series 5: DWI · axial · 3.0mm · 1.36mm/px · z∈[-57,+89]mm · 6 of 100 slices shown (1 of 2)]
[im 1/100]
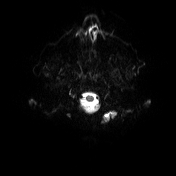
[im 20/100]
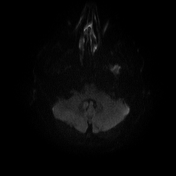
[im 40/100]
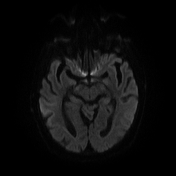
[im 60/100]
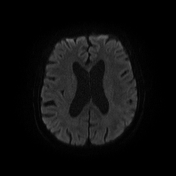
[im 80/100]
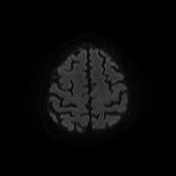
[im 100/100]
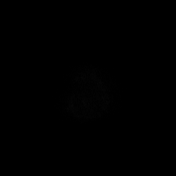

[Series 6: DWI · axial · 3.0mm · 1.36mm/px · z∈[-57,+89]mm · 3 of 50 slices shown (2 of 2)]
[im 1/50]
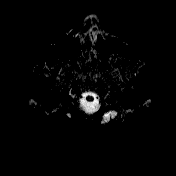
[im 25/50]
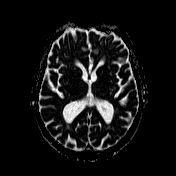
[im 50/50]
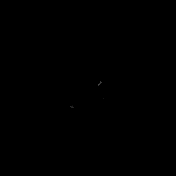

[Series 7: T1 · sagittal · 5.0mm · 0.75mm/px · 1 of 24 slices shown (1 of 2)]
[im 1/24]
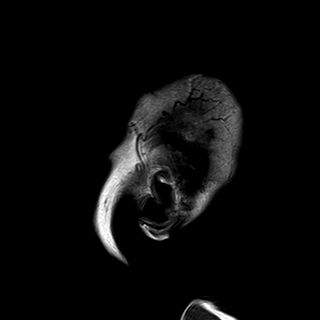

[Series 8: T2 · axial · 5.0mm · 0.62mm/px · z∈[-65,+96]mm · 2 of 26 slices shown]
[im 1/26]
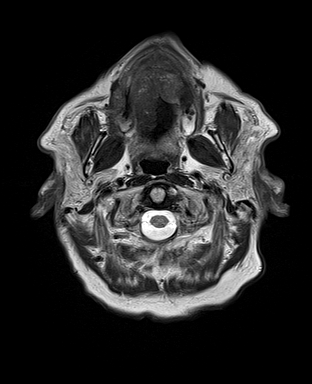
[im 26/26]
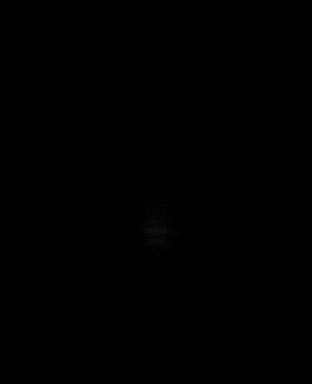

[Series 9: swi_images · axial · 3.0mm · 0.75mm/px · z∈[-66,+97]mm · 3 of 56 slices shown]
[im 1/56]
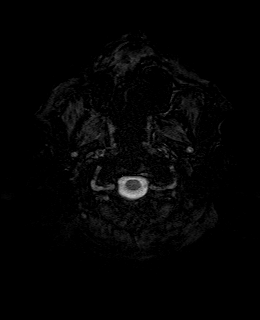
[im 28/56]
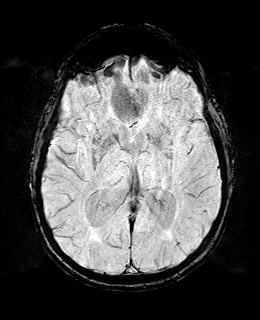
[im 56/56]
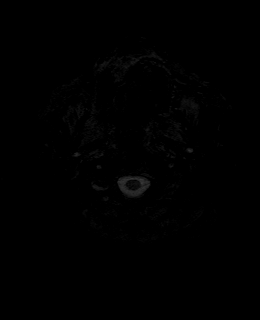

[Series 11: FLAIR · axial · 3.0mm · 0.75mm/px · z∈[-60,+92]mm · 3 of 52 slices shown]
[im 1/52]
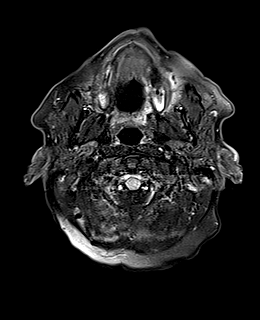
[im 26/52]
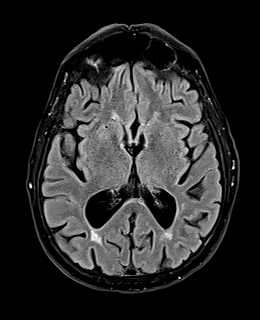
[im 52/52]
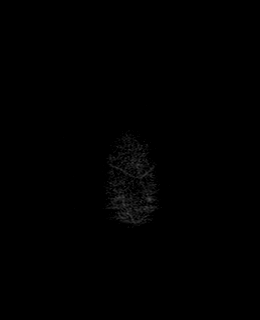

[Series 12: T1 · axial · 1.0mm · 0.94mm/px · z∈[-63,+95]mm · 10 of 160 slices shown (2 of 2)]
[im 1/160]
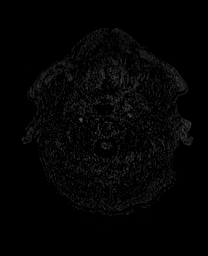
[im 18/160]
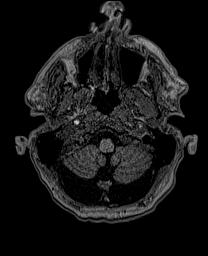
[im 36/160]
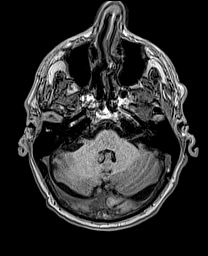
[im 54/160]
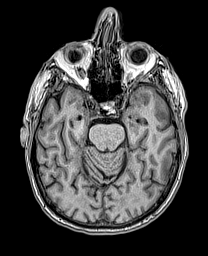
[im 71/160]
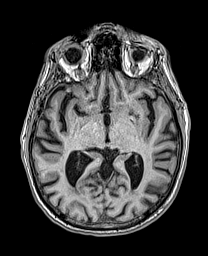
[im 89/160]
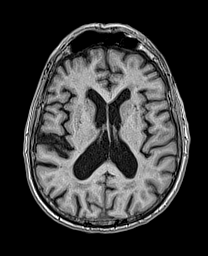
[im 107/160]
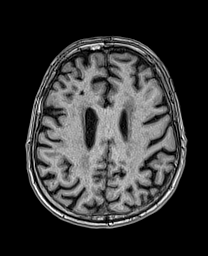
[im 124/160]
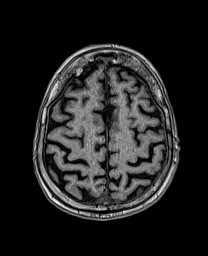
[im 142/160]
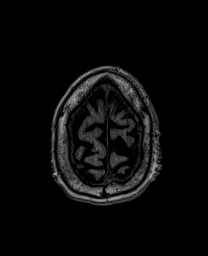
[im 160/160]
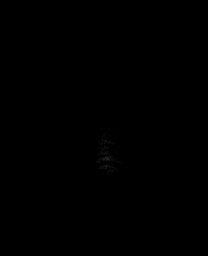

[Series 13: cor dwi_tracew · coronal · 5.0mm · 1.53mm/px · 3 of 56 slices shown]
[im 1/56]
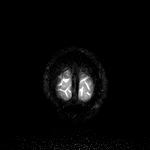
[im 28/56]
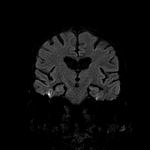
[im 56/56]
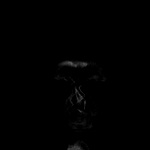

[Series 14: cor dwi_adc · coronal · 5.0mm · 1.53mm/px · 2 of 28 slices shown]
[im 1/28]
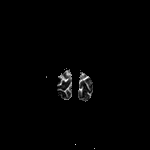
[im 28/28]
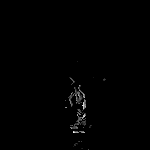

[Series 15: T2 post-contrast · coronal · 5.0mm · 0.57mm/px · 2 of 28 slices shown]
[im 1/28]
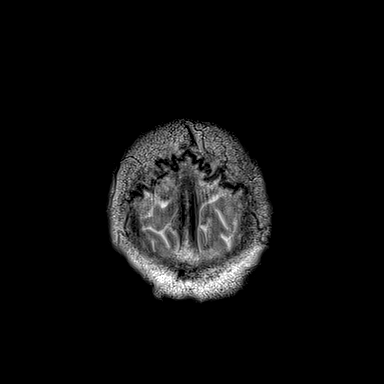
[im 28/28]
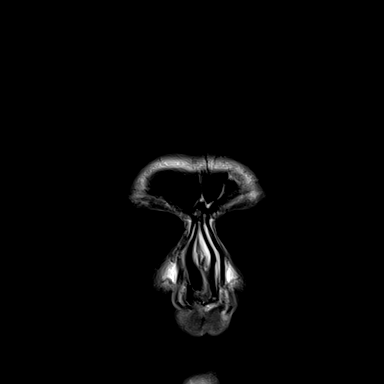

[Series 16: T1 post-contrast · axial · 1.0mm · 0.94mm/px · z∈[-63,+95]mm · 10 of 160 slices shown (1 of 3)]
[im 1/160]
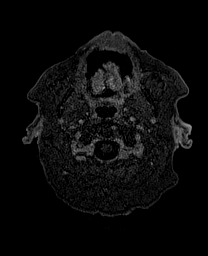
[im 18/160]
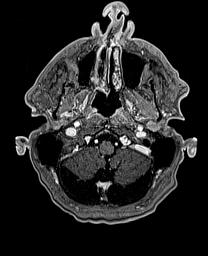
[im 36/160]
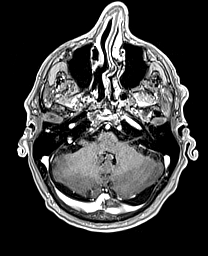
[im 54/160]
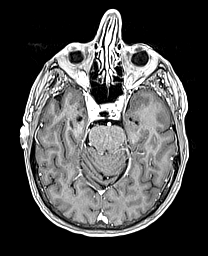
[im 71/160]
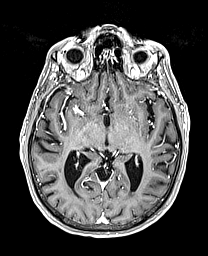
[im 89/160]
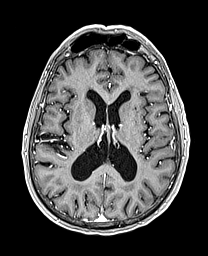
[im 107/160]
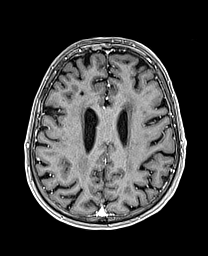
[im 124/160]
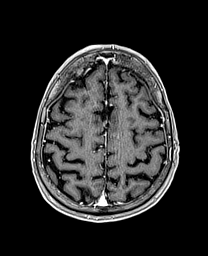
[im 142/160]
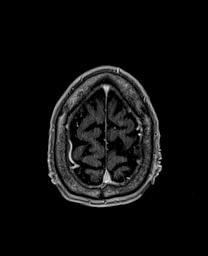
[im 160/160]
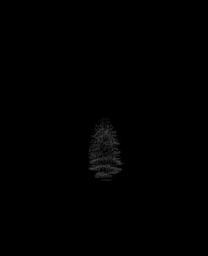

[Series 17: T1 post-contrast · coronal · 5.0mm · 0.43mm/px · 2 of 28 slices shown (2 of 3)]
[im 1/28]
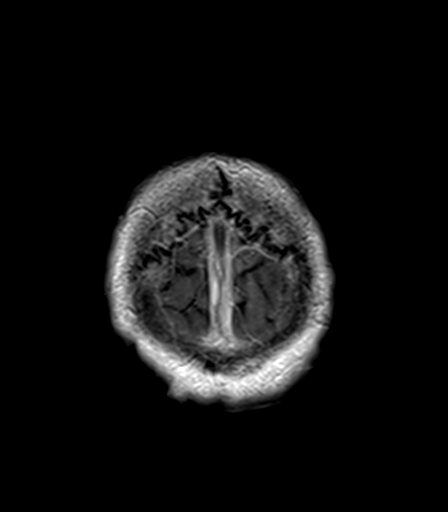
[im 28/28]
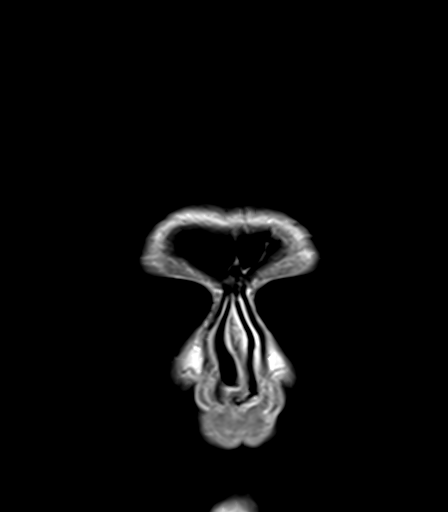

[Series 18: T1 post-contrast · sagittal · 5.0mm · 0.75mm/px · 1 of 24 slices shown (3 of 3)]
[im 1/24]
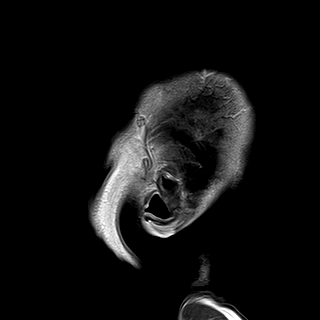

[48 of 48 positions shown; findings below may reference images not displayed]

FINDINGS: Brain: The brain shows a background pattern of chronic small vessel
ischemic changes of the pons and cerebral hemispheric white matter.
There is a punctate deep white matter infarction adjacent to the
anterior body of the left lateral ventricle, presumably incidental.
Question if there is punctate enhancement in this location.
Therefore, this is indeterminate for small vessel infarction with
associated enhancement versus punctate metastasis. There is no other
focus abnormal enhancement or restricted diffusion. No focal
cerebellar lesion is seen. Incidental mega cisterna magna. No large
vessel territory infarction. No hydrocephalus or extra-axial
collection.

Vascular: Major vessels at the base of the brain show flow.

Skull and upper cervical spine: Negative

Sinuses/Orbits: Clear/normal

Other: None
IMPRESSION: Background pattern of moderate chronic small vessel ischemic change.

Punctate focus of restricted diffusion with associated contrast
enhancement in the white matter adjacent to the anterior body of the
left lateral ventricle. The finding is indeterminate for
incidentally detected small vessel infarction versus punctate
metastasis. No second lesion suspicious for metastasis is
identified. This could be followed in 6-12 weeks to assess for
change.

## 2021-03-20 MED ORDER — GADOBUTROL 1 MMOL/ML IV SOLN
8.0000 mL | Freq: Once | INTRAVENOUS | Status: AC | PRN
  Administered 2021-03-20: 8 mL via INTRAVENOUS

## 2021-03-27 DIAGNOSIS — Z20822 Contact with and (suspected) exposure to covid-19: Secondary | ICD-10-CM | POA: Diagnosis not present

## 2021-03-28 ENCOUNTER — Telehealth: Payer: Self-pay | Admitting: Internal Medicine

## 2021-03-28 DIAGNOSIS — U071 COVID-19: Secondary | ICD-10-CM | POA: Diagnosis not present

## 2021-03-28 DIAGNOSIS — Z20828 Contact with and (suspected) exposure to other viral communicable diseases: Secondary | ICD-10-CM | POA: Diagnosis not present

## 2021-03-28 DIAGNOSIS — R059 Cough, unspecified: Secondary | ICD-10-CM | POA: Diagnosis not present

## 2021-03-28 DIAGNOSIS — R0981 Nasal congestion: Secondary | ICD-10-CM | POA: Diagnosis not present

## 2021-03-28 NOTE — Telephone Encounter (Signed)
Sch per 10/12 los, pt aware

## 2021-03-30 ENCOUNTER — Other Ambulatory Visit: Payer: Self-pay

## 2021-03-30 ENCOUNTER — Emergency Department (HOSPITAL_COMMUNITY): Payer: Medicare Other

## 2021-03-30 ENCOUNTER — Emergency Department (HOSPITAL_COMMUNITY)
Admission: EM | Admit: 2021-03-30 | Discharge: 2021-03-30 | Disposition: A | Discharge status: Home / Self Care | Payer: Medicare Other | Attending: Emergency Medicine | Admitting: Emergency Medicine

## 2021-03-30 ENCOUNTER — Encounter (HOSPITAL_COMMUNITY): Payer: Self-pay | Admitting: *Deleted

## 2021-03-30 DIAGNOSIS — U071 COVID-19: Secondary | ICD-10-CM | POA: Insufficient documentation

## 2021-03-30 DIAGNOSIS — R0602 Shortness of breath: Secondary | ICD-10-CM | POA: Diagnosis not present

## 2021-03-30 DIAGNOSIS — Z8546 Personal history of malignant neoplasm of prostate: Secondary | ICD-10-CM | POA: Diagnosis not present

## 2021-03-30 DIAGNOSIS — Z85118 Personal history of other malignant neoplasm of bronchus and lung: Secondary | ICD-10-CM | POA: Diagnosis not present

## 2021-03-30 DIAGNOSIS — I10 Essential (primary) hypertension: Secondary | ICD-10-CM | POA: Insufficient documentation

## 2021-03-30 DIAGNOSIS — I2510 Atherosclerotic heart disease of native coronary artery without angina pectoris: Secondary | ICD-10-CM | POA: Insufficient documentation

## 2021-03-30 DIAGNOSIS — E785 Hyperlipidemia, unspecified: Secondary | ICD-10-CM | POA: Diagnosis not present

## 2021-03-30 DIAGNOSIS — Z7984 Long term (current) use of oral hypoglycemic drugs: Secondary | ICD-10-CM | POA: Insufficient documentation

## 2021-03-30 DIAGNOSIS — Z87891 Personal history of nicotine dependence: Secondary | ICD-10-CM | POA: Diagnosis not present

## 2021-03-30 DIAGNOSIS — Z7901 Long term (current) use of anticoagulants: Secondary | ICD-10-CM | POA: Insufficient documentation

## 2021-03-30 DIAGNOSIS — J45909 Unspecified asthma, uncomplicated: Secondary | ICD-10-CM | POA: Insufficient documentation

## 2021-03-30 DIAGNOSIS — E1169 Type 2 diabetes mellitus with other specified complication: Secondary | ICD-10-CM | POA: Insufficient documentation

## 2021-03-30 DIAGNOSIS — J449 Chronic obstructive pulmonary disease, unspecified: Secondary | ICD-10-CM | POA: Insufficient documentation

## 2021-03-30 DIAGNOSIS — Z79899 Other long term (current) drug therapy: Secondary | ICD-10-CM | POA: Insufficient documentation

## 2021-03-30 DIAGNOSIS — I517 Cardiomegaly: Secondary | ICD-10-CM | POA: Diagnosis not present

## 2021-03-30 DIAGNOSIS — C3490 Malignant neoplasm of unspecified part of unspecified bronchus or lung: Secondary | ICD-10-CM | POA: Diagnosis not present

## 2021-03-30 DIAGNOSIS — R059 Cough, unspecified: Secondary | ICD-10-CM | POA: Diagnosis not present

## 2021-03-30 LAB — CBC WITH DIFFERENTIAL/PLATELET
Abs Immature Granulocytes: 0.03 10*3/uL (ref 0.00–0.07)
Basophils Absolute: 0 10*3/uL (ref 0.0–0.1)
Basophils Relative: 1 %
Eosinophils Absolute: 0.1 10*3/uL (ref 0.0–0.5)
Eosinophils Relative: 3 %
HCT: 38.3 % — ABNORMAL LOW (ref 39.0–52.0)
Hemoglobin: 12.6 g/dL — ABNORMAL LOW (ref 13.0–17.0)
Immature Granulocytes: 1 %
Lymphocytes Relative: 38 %
Lymphs Abs: 2 10*3/uL (ref 0.7–4.0)
MCH: 29.5 pg (ref 26.0–34.0)
MCHC: 32.9 g/dL (ref 30.0–36.0)
MCV: 89.7 fL (ref 80.0–100.0)
Monocytes Absolute: 0.5 10*3/uL (ref 0.1–1.0)
Monocytes Relative: 10 %
Neutro Abs: 2.6 10*3/uL (ref 1.7–7.7)
Neutrophils Relative %: 47 %
Platelets: 114 10*3/uL — ABNORMAL LOW (ref 150–400)
RBC: 4.27 MIL/uL (ref 4.22–5.81)
RDW: 14.4 % (ref 11.5–15.5)
WBC: 5.3 10*3/uL (ref 4.0–10.5)
nRBC: 0 % (ref 0.0–0.2)

## 2021-03-30 LAB — COMPREHENSIVE METABOLIC PANEL
ALT: 14 U/L (ref 0–44)
AST: 23 U/L (ref 15–41)
Albumin: 3.8 g/dL (ref 3.5–5.0)
Alkaline Phosphatase: 88 U/L (ref 38–126)
Anion gap: 7 (ref 5–15)
BUN: 16 mg/dL (ref 8–23)
CO2: 23 mmol/L (ref 22–32)
Calcium: 8.7 mg/dL — ABNORMAL LOW (ref 8.9–10.3)
Chloride: 108 mmol/L (ref 98–111)
Creatinine, Ser: 0.66 mg/dL (ref 0.61–1.24)
GFR, Estimated: >60 mL/min (ref >60)
Glucose, Bld: 134 mg/dL — ABNORMAL HIGH (ref 70–99)
Potassium: 3.6 mmol/L (ref 3.5–5.1)
Sodium: 138 mmol/L (ref 135–145)
Total Bilirubin: 0.9 mg/dL (ref 0.3–1.2)
Total Protein: 7.5 g/dL (ref 6.5–8.1)

## 2021-03-30 MED ORDER — ALBUTEROL SULFATE HFA 108 (90 BASE) MCG/ACT IN AERS
2.0000 | INHALATION_SPRAY | Freq: Once | RESPIRATORY_TRACT | Status: AC
  Administered 2021-03-30: 2 via RESPIRATORY_TRACT
  Filled 2021-03-30: qty 6.7

## 2021-03-30 MED ORDER — AEROCHAMBER Z-STAT PLUS/MEDIUM MISC
1.0000 | Freq: Once | Status: AC
  Administered 2021-03-30: 1
  Filled 2021-03-30: qty 1

## 2021-03-30 MED ORDER — SODIUM CHLORIDE 0.9 % IV BOLUS
500.0000 mL | Freq: Once | INTRAVENOUS | Status: AC
  Administered 2021-03-30: 500 mL via INTRAVENOUS

## 2021-03-30 NOTE — ED Notes (Signed)
Pt able to ambulate with pulse ox. Pt started at 97% prior to ambulation. Pt walked from his room to rm 16 and back without a drop in O2 levels. Pt maintained 95% for duration of walking.

## 2021-03-30 NOTE — ED Triage Notes (Signed)
Pt was diagnosed with Covid on 10/28 and was having a productive cough. He currently feels like his chest is "tighting up with congestion" and that he can't cough it up now. He doesn't feel sob or like he is currently having fevers or chills. Since he is a cancer pt he wants to "get ahead" of the covid.

## 2021-03-30 NOTE — Discharge Instructions (Signed)
Take two puffs of the albuterol inhaler as needed for shortness of breath or chest tightness  Please follow up with your primary care provider within 5-7 days for re-evaluation of your symptoms. If you do not have a primary care provider, information for a healthcare clinic has been provided for you to make arrangements for follow up care. Please return to the emergency department for any new or worsening symptoms.

## 2021-03-30 NOTE — ED Notes (Signed)
Patient transported to X-ray 

## 2021-03-30 NOTE — ED Provider Notes (Signed)
[facility] DEPT Provider Note   CSN: [id number] Arrival date & time: 03/30/21  1527     History Chief Complaint  Patient presents with   Covid Positive    Vincent Velez is a 80 y.o. male.  HPI  80 year old male with a history of AAA, anxiety, arthritis, asthma, A. fib on Xarelto, CAD, COPD, diabetes, GERD, heart murmur, hiatal hernia, hyperlipidemia, hypertension, malignant neoplasm of the prostate, recently diagnosed lung adenocarcinoma, presents to the emergency department today for evaluation of shortness of breath.  Patient states that he started having chills about 5 days ago.  He took a COVID test 3 days ago and it was positive.  He was started on multi peer of year at that time.  Since then he has had a bit of a cough which has been improving and also some diarrhea.  He became concerned because last night he felt some tightness in his chest that was new when he is worried that he could have pneumonia.  Past Medical History:  Diagnosis Date   AAA (abdominal aortic aneurysm)    Anxiety    Arthritis    Asthma    when younger   Atrial fibrillation (HCC)    CAD (coronary artery disease)    Cataract    removed both   COPD (chronic obstructive pulmonary disease) (HCC)    Diabetes mellitus    Type II   Dysrhythmia    afib   Esophageal reflux    Family history of malignant neoplasm of gastrointestinal tract    Heart murmur    Hiatal hernia    Hyperlipemia    Hypertension    Lung nodule    a. PET scan highly suspicious for malignancy. 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   Peripheral vascular disease ([place])    Hypertension    Hyperlipemia    Diabetes mellitus ([place])    COPD (chronic obstructive pulmonary disease) (HCC)    Lung nodule    Severe aortic stenosis    Chronic anticoagulation 05/04/2016   Persistent atrial fibrillation    Aneurysm of abdominal vessel 05/10/2012   ADENOCARCINOMA, 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   ESOPHAGEAL STRICTURE 04/30/2008   GERD 04/30/2008   HIATAL HERNIA 04/30/2008    Past Surgical History:  Procedure Laterality Date   ABDOMINAL AORTA STENT     CARDIAC CATHETERIZATION     CARDIOVERSION N/A 12/04/2015   Procedure: CARDIOVERSION;  Surgeon: Sanda Klein, MD;  Location: [facility];  Service: Cardiovascular;  Laterality: N/A;   COLONOSCOPY     FINGER SURGERY Right    middle finger- amputation   INSERTION PROSTATE RADIATION SEED     MEDIASTINOSCOPY N/A 01/06/2019   Procedure: MEDIASTINOSCOPY WITH BIOPIES;  Surgeon: Gaye Pollack, MD;  Location: [facility];  Service: Thoracic;  Laterality: N/A;   RIGHT/LEFT HEART CATH AND CORONARY ANGIOGRAPHY N/A 10/26/2018   Procedure: RIGHT/LEFT HEART CATH AND CORONARY ANGIOGRAPHY;  Surgeon: Burnell Blanks, MD;  Location: [facility];  Service: Cardiovascular;  Laterality: N/A;   TEE WITHOUT CARDIOVERSION N/A 12/06/2018   Procedure:  TRANSESOPHAGEAL ECHOCARDIOGRAM (TEE);  Surgeon: Burnell Blanks, MD;  Location: [facility];  Service: Open Heart Surgery;  Laterality: N/A;   TRANSCATHETER AORTIC VALVE REPLACEMENT, TRANSFEMORAL N/A 12/06/2018   Procedure: TRANSCATHETER AORTIC VALVE REPLACEMENT, TRANSFEMORAL;  Surgeon: Burnell Blanks, MD;  Location: [facility];  Service: Open Heart Surgery;  Laterality: N/A;   UPPER GASTROINTESTINAL ENDOSCOPY     VIDEO ASSISTED THORACOSCOPY (VATS)/THOROCOTOMY Left 02/02/2019   Procedure: VIDEO ASSISTED THORACOSCOPY (VATS)/THOROCOTOMY;  Surgeon: Gaye Pollack, MD;  Location: [facility] OR;  Service: Thoracic;   Laterality: Left;   VIDEO BRONCHOSCOPY N/A 01/06/2019   Procedure: VIDEO BRONCHOSCOPY;  Surgeon: Gaye Pollack, MD;  Location: [facility] OR;  Service: Thoracic;  Laterality: N/A;   VIDEO BRONCHOSCOPY WITH ENDOBRONCHIAL ULTRASOUND N/A 03/07/2021   Procedure: VIDEO BRONCHOSCOPY WITH ENDOBRONCHIAL ULTRASOUND;  Surgeon: [name], MD;  Location: [facility];  Service: Thoracic;  Laterality: N/A;   WEDGE RESECTION Left 02/02/2019   Procedure: LUNG RESECTION;  Surgeon: Gaye Pollack, MD;  Location: [facility];  Service: Thoracic;  Laterality: Left;       Family History  Problem Relation Age of Onset   Colon cancer Father        questionable   Diabetes Mother    Heart disease Mother        Heart Disease before age 66   Deep vein thrombosis Mother    Hyperlipidemia Mother    Hypertension Mother    Varicose Veins Mother    Hypertension Brother    Heart attack Brother    Prostate cancer Brother    Colon polyps Neg Hx    Esophageal cancer Neg Hx    Rectal cancer Neg Hx    Stomach cancer Neg Hx     Social History   Tobacco Use   Smoking status: Former    Types: Pipe, Cigars    Quit date: 12/2018    Years since quitting: 2.2   Smokeless tobacco: Never  Vaping Use   Vaping Use: Never used  Substance Use Topics   Alcohol use: Not Currently    Alcohol/week: 0.0 standard drinks    Comment: occ   Drug use: No    Home Medications Prior to Admission medications   Medication Sig Start Date End Date Taking? Authorizing Provider  acetaminophen (TYLENOL) 500 MG tablet Take 2 tablets (1,000 mg total) by mouth every 6 (six) hours as needed for mild pain or fever. 02/06/19   Barrett, Erin R, PA-C  diltiazem (CARDIZEM SR) 60 MG 12 hr capsule Take 1 capsule (60 mg total) by mouth every 12 (twelve) hours. 06/06/19   Eileen Stanford, PA-C  dorzolamide-timolol (COSOPT) 22.3-6.8 MG/ML ophthalmic solution Place 1 drop into both eyes in the morning, at noon, and at bedtime. 06/05/19   [provider]  glimepiride (AMARYL) 2 MG tablet Take 2 mg by mouth daily with breakfast.    [provider]  JARDIANCE 25 MG TABS tablet Take 25 mg by mouth daily. 03/01/21   [provider]  metFORMIN (GLUCOPHAGE-XR) 500 MG 24 hr tablet Take 1,000 mg by mouth every evening.  07/01/18   [provider]  metoprolol succinate (TOPROL-XL) 50 MG 24 hr tablet Take 50 mg by mouth daily. 03/03/21   [provider]  Multiple Vitamins-Minerals (MULTIVITAMIN WITH MINERALS) tablet Take 1 tablet by mouth daily.    [provider]  Multiple Vitamins-Minerals (ZINC PO) Take 1 tablet by mouth daily.    [provider]  pantoprazole (PROTONIX) 40 MG tablet Take 40 mg by mouth daily. 04/29/19   [provider]  simvastatin (ZOCOR) 40 MG tablet Take 40 mg by mouth at bedtime.    [provider]  XARELTO 20 MG TABS tablet TAKE 1 TABLET BY MOUTH  DAILY WITH SUPPER 12/20/17   Jerline Pain, MD    Allergies    Macrolides and ketolides  Review of Systems   Review of Systems  Constitutional:  Negative for fever.  HENT:  Negative for ear pain and sore throat.   Eyes:  Negative for visual disturbance.  Respiratory:  Positive for cough and shortness of breath.   Cardiovascular:  Negative for chest pain.  Gastrointestinal:  Negative for abdominal pain, constipation, diarrhea, nausea and vomiting.  Genitourinary:  Negative for dysuria and hematuria.  Musculoskeletal:  Negative for back pain.  Skin:  Negative for rash.  Neurological:  Negative for headaches.  All other systems reviewed and are negative.  Physical Exam Updated Vital Signs BP 131/87 (BP Location: Right Arm)   Pulse 66   Temp 97.9 F (36.6 C) (Oral)   Resp 16   Ht 5\' 9"  (1.753 m)   Wt 76.2 kg   SpO2 97%   BMI 24.81 kg/m   Physical Exam Vitals and nursing note reviewed.  Constitutional:      Appearance: He is well-developed.  HENT:     Head: Normocephalic and atraumatic.  Eyes:      Conjunctiva/sclera: Conjunctivae normal.  Cardiovascular:     Rate and Rhythm: Normal rate.     Heart sounds: Normal heart sounds. No murmur heard.    Comments: Irregularly irregular rhythm Pulmonary:     Effort: Pulmonary effort is normal. No respiratory distress.     Breath sounds: Normal breath sounds. No wheezing, rhonchi or rales.  Abdominal:     General: Bowel sounds are normal.     Palpations: Abdomen is soft.     Tenderness: There is no abdominal tenderness. There is no guarding or rebound.  Musculoskeletal:     Cervical back: Neck supple.  Skin:    General: Skin is warm and dry.  Neurological:     Mental Status: He is alert.    ED Results / Procedures / Treatments   Labs (all labs ordered are listed, but only abnormal results are displayed) Labs Reviewed  CBC WITH DIFFERENTIAL/PLATELET - Abnormal; Notable for the following components:      Result Value   Hemoglobin 12.6 (*)    HCT 38.3 (*)    Platelets 114 (*)    All other components within normal limits  COMPREHENSIVE METABOLIC PANEL - Abnormal; Notable for the following components:   Glucose, Bld 134 (*)    Calcium 8.7 (*)    All other components within normal limits    EKG EKG Interpretation  Date/Time:  Sunday March 30 2021 16:13:22 EDT Ventricular Rate:  64 PR Interval:    QRS Duration: 162 QT Interval:  617 QTC Calculation: 637 R Axis:   -45 Text Interpretation: Atrial flutter with predominant 4:1 AV block Nonspecific IVCD with LAD LVH with secondary repolarization abnormality Abnormal ECG Confirmed by Carmin Muskrat [phone]) on 03/30/2021 4:22:52 PM  Radiology DG Chest 2 View  Result Date: 03/30/2021 CLINICAL DATA:  Shortness of breath and cough. Lung cancer. Recent COVID. EXAM: CHEST - 2 VIEW COMPARISON:  03/05/2021 chest radiograph and prior studies. FINDINGS: Cardiomegaly and aortic valve replacement again noted. Mid-lower LEFT lung interstitial prominence and  LEFT pleural thickening again  noted. There is no evidence of pleural effusion or pneumothorax. No other significant changes are identified. IMPRESSION: Cardiomegaly with unchanged LEFT pulmonary interstitial prominence and LEFT pleural thickening. Electronically Signed   By: Margarette Canada M.D.   On: 03/30/2021 16:12    Procedures Procedures   Medications Ordered in ED Medications  albuterol (VENTOLIN HFA) 108 (90 Base) MCG/ACT inhaler 2 puff (2 puffs Inhalation Given 03/30/21 1624)  sodium chloride 0.9 % bolus 500 mL (500 mLs Intravenous New Bag/Given 03/30/21 1623)  aerochamber Z-Stat Plus/medium 1 each (1 each Other Given 03/30/21 1625)    ED Course  I have reviewed the triage vital signs and the nursing notes.  Pertinent labs & imaging results that were available during my care of the patient were reviewed by me and considered in my medical decision making (see chart for details).    MDM Rules/Calculators/A&P                          80 y/o M presents to the ED today for eval of chest tightness. He was recently dx with covid  Reviewed/interpreted labs CBC reassuring CMP reassuring   EKG without acute ischemic findings   Reviewed/interpreted imaging CXR - Cardiomegaly with unchanged LEFT pulmonary interstitial prominence and LEFT pleural thickening  Pt was given albuterol in the ed. He was ambulated and sats remained above 92% on RA. He had some improvement while here in the ED after albuterol and he has no evidence of pneumonia on his cxr. Feel he is appropriate for d/c home with continued therapy with albuterol and molnupirovir. Advised on close pcp f/u and strict return precautions. He voices understanding of the plan and reasons to return. All questions answered, pt stable for discharge   Final Clinical Impression(s) / ED Diagnoses Final diagnoses:  COVID    Rx / DC Orders ED Discharge Orders     None        Bishop [place] 03/30/21 1715    Carmin Muskrat, MD 04/02/21 [phone]

## 2021-03-31 ENCOUNTER — Telehealth: Payer: Self-pay | Admitting: Medical Oncology

## 2021-03-31 NOTE — Telephone Encounter (Signed)
Pt tested positive for COVID on 04/27/21.  On 11/3 -he wants to change his appt to a phone visit and go over his molecular results.  He has been on molnupravir ( [place])since Friday.

## 2021-04-03 ENCOUNTER — Inpatient Hospital Stay: Payer: Medicare Other | Attending: Physician Assistant | Admitting: Internal Medicine

## 2021-04-03 ENCOUNTER — Telehealth: Payer: Self-pay | Admitting: Medical Oncology

## 2021-04-03 ENCOUNTER — Other Ambulatory Visit: Payer: Self-pay

## 2021-04-03 DIAGNOSIS — Z5111 Encounter for antineoplastic chemotherapy: Secondary | ICD-10-CM | POA: Insufficient documentation

## 2021-04-03 DIAGNOSIS — Z8616 Personal history of COVID-19: Secondary | ICD-10-CM | POA: Diagnosis not present

## 2021-04-03 DIAGNOSIS — C782 Secondary malignant neoplasm of pleura: Secondary | ICD-10-CM | POA: Insufficient documentation

## 2021-04-03 DIAGNOSIS — I4891 Unspecified atrial fibrillation: Secondary | ICD-10-CM | POA: Insufficient documentation

## 2021-04-03 DIAGNOSIS — R0602 Shortness of breath: Secondary | ICD-10-CM | POA: Diagnosis not present

## 2021-04-03 DIAGNOSIS — C3492 Malignant neoplasm of unspecified part of left bronchus or lung: Secondary | ICD-10-CM

## 2021-04-03 DIAGNOSIS — C3412 Malignant neoplasm of upper lobe, left bronchus or lung: Secondary | ICD-10-CM | POA: Insufficient documentation

## 2021-04-03 DIAGNOSIS — M47814 Spondylosis without myelopathy or radiculopathy, thoracic region: Secondary | ICD-10-CM | POA: Insufficient documentation

## 2021-04-03 DIAGNOSIS — R5383 Other fatigue: Secondary | ICD-10-CM | POA: Diagnosis not present

## 2021-04-03 DIAGNOSIS — Z923 Personal history of irradiation: Secondary | ICD-10-CM | POA: Diagnosis not present

## 2021-04-03 DIAGNOSIS — Z23 Encounter for immunization: Secondary | ICD-10-CM | POA: Insufficient documentation

## 2021-04-03 DIAGNOSIS — R0609 Other forms of dyspnea: Secondary | ICD-10-CM | POA: Insufficient documentation

## 2021-04-03 DIAGNOSIS — Z7901 Long term (current) use of anticoagulants: Secondary | ICD-10-CM | POA: Diagnosis not present

## 2021-04-03 DIAGNOSIS — Z5112 Encounter for antineoplastic immunotherapy: Secondary | ICD-10-CM | POA: Diagnosis not present

## 2021-04-03 DIAGNOSIS — R059 Cough, unspecified: Secondary | ICD-10-CM | POA: Diagnosis not present

## 2021-04-03 DIAGNOSIS — Z79899 Other long term (current) drug therapy: Secondary | ICD-10-CM | POA: Insufficient documentation

## 2021-04-03 DIAGNOSIS — R197 Diarrhea, unspecified: Secondary | ICD-10-CM | POA: Insufficient documentation

## 2021-04-03 DIAGNOSIS — R042 Hemoptysis: Secondary | ICD-10-CM | POA: Diagnosis not present

## 2021-04-03 NOTE — Progress Notes (Signed)
[place] Telephone:([phone]) [phone]   [phone]  PROGRESS NOTE FOR TELEMEDICINE VISITS  [name], [place] [employer] [place] [place] [address]  I connected with Vincent Velez on 04/03/21 at  9:15 AM EDT by video enabled telemedicine visit and verified that I am speaking with the correct person using two identifiers.   I discussed the limitations, risks, security and privacy concerns of performing an evaluation and management service by telemedicine and the availability of in-person appointments. I also discussed with the patient that there may be a patient responsible charge related to this service. The patient expressed understanding and agreed to proceed.  Other persons participating in the visit and their role in the encounter:  Daughter  Patient's location:  Home Provider's location: [place].  DIAGNOSIS: Recurrent/metastatic non-small cell lung cancer, adenocarcinoma initially diagnosed as a stage Ib (T2 a, N0, M0) adenocarcinoma in September 2020 status post wedge resection of the left upper lobe nodule.  The patient has disease recurrence and September 2022 with metastatic mediastinal lymphadenopathy as well as left hilar and left pleural metastatic disease  PRIOR THERAPY: None  CURRENT THERAPY: None  INTERVAL HISTORY: Vincent Velez 80 y.o. male has a virtual MyChart video visit with me today for evaluation and recommendation regarding his condition.  He was diagnosed with COVID-19 last week and could not come to the clinic for evaluation.  2he patient is feeling fine today with no concerning complaints except for mild cough and shortness of breath with exertion but he denied having any chest pain or hemoptysis.  He has no nausea, vomiting, diarrhea or constipation.  He has no headache or visual changes.  He has no significant weight loss or night sweats.  He had MRI of the brain performed recently and we are having the  visit for evaluation and discussion of his MRI results as well as his treatment options.  His tissue block was sent for molecular studies but the final results are still not available.  MEDICAL HISTORY: Past Medical History:  Diagnosis Date   AAA (abdominal aortic aneurysm)    Anxiety    Arthritis    Asthma    when younger   Atrial fibrillation (HCC)    CAD (coronary artery disease)    Cataract    removed both   COPD (chronic obstructive pulmonary disease) (HCC)    Diabetes mellitus    Type II   Dysrhythmia    afib   Esophageal reflux    Family history of malignant neoplasm of gastrointestinal tract    Heart murmur    Hiatal hernia    Hyperlipemia    Hypertension    Lung nodule    a. PET scan highly suspicious for malignancy. Bronch with biopsy to be done after TAVR   Malignant neoplasm of prostate [facility])    prostate    Peripheral vascular disease ([place])    Pneumonia    Stricture and stenosis of esophagus     ALLERGIES:  is allergic to macrolides and ketolides.  MEDICATIONS:  Current Outpatient Medications  Medication Sig Dispense Refill   ACCU-CHEK GUIDE test strip USE TO CHECK YOUR BLOOD SUGAR ONCE DAILY     acetaminophen (TYLENOL) 500 MG tablet Take 2 tablets (1,000 mg total) by mouth every 6 (six) hours as needed for mild pain or fever. 30 tablet 0   diltiazem (CARDIZEM SR) 60 MG 12 hr capsule Take 1 capsule (60 mg total) by mouth every 12 (  twelve) hours. 60 capsule 7   dorzolamide-timolol (COSOPT) 22.3-6.8 MG/ML ophthalmic solution Place 1 drop into both eyes in the morning, at noon, and at bedtime.     glimepiride (AMARYL) 2 MG tablet Take 2 mg by mouth daily with breakfast.     JARDIANCE 25 MG TABS tablet Take 25 mg by mouth daily.     LAGEVRIO 200 MG CAPS capsule Take 4 capsules by mouth 2 (two) times daily.     metFORMIN (GLUCOPHAGE-XR) 500 MG 24 hr tablet Take 1,000 mg by mouth every evening.      metoprolol succinate (TOPROL-XL) 50 MG 24 hr tablet Take 50 mg by  mouth daily.     Multiple Vitamins-Minerals (MULTIVITAMIN WITH MINERALS) tablet Take 1 tablet by mouth daily.     Multiple Vitamins-Minerals (ZINC PO) Take 1 tablet by mouth daily.     pantoprazole (PROTONIX) 40 MG tablet Take 40 mg by mouth daily.     simvastatin (ZOCOR) 40 MG tablet Take 40 mg by mouth at bedtime.     XARELTO 20 MG TABS tablet TAKE 1 TABLET BY MOUTH  DAILY WITH SUPPER 90 tablet 1   No current facility-administered medications for this visit.    SURGICAL HISTORY:  Past Surgical History:  Procedure Laterality Date   ABDOMINAL AORTA STENT     CARDIAC CATHETERIZATION     CARDIOVERSION N/A 12/04/2015   Procedure: CARDIOVERSION;  Surgeon: Sanda Klein, MD;  Location: [facility];  Service: Cardiovascular;  Laterality: N/A;   COLONOSCOPY     FINGER SURGERY Right    middle finger- amputation   INSERTION PROSTATE RADIATION SEED     MEDIASTINOSCOPY N/A 01/06/2019   Procedure: MEDIASTINOSCOPY WITH BIOPIES;  Surgeon: Gaye Pollack, MD;  Location: [facility];  Service: Thoracic;  Laterality: N/A;   RIGHT/LEFT HEART CATH AND CORONARY ANGIOGRAPHY N/A 10/26/2018   Procedure: RIGHT/LEFT HEART CATH AND CORONARY ANGIOGRAPHY;  Surgeon: Burnell Blanks, MD;  Location: [facility];  Service: Cardiovascular;  Laterality: N/A;   TEE WITHOUT CARDIOVERSION N/A 12/06/2018   Procedure: TRANSESOPHAGEAL ECHOCARDIOGRAM (TEE);  Surgeon: Burnell Blanks, MD;  Location: [facility];  Service: Open Heart Surgery;  Laterality: N/A;   TRANSCATHETER AORTIC VALVE REPLACEMENT, TRANSFEMORAL N/A 12/06/2018   Procedure: TRANSCATHETER AORTIC VALVE REPLACEMENT, TRANSFEMORAL;  Surgeon: Burnell Blanks, MD;  Location: [facility];  Service: Open Heart Surgery;  Laterality: N/A;   UPPER GASTROINTESTINAL ENDOSCOPY     VIDEO ASSISTED THORACOSCOPY (VATS)/THOROCOTOMY Left 02/02/2019   Procedure: VIDEO ASSISTED THORACOSCOPY (VATS)/THOROCOTOMY;  Surgeon: Gaye Pollack, MD;  Location:  [facility];  Service: Thoracic;  Laterality: Left;   VIDEO BRONCHOSCOPY N/A 01/06/2019   Procedure: VIDEO BRONCHOSCOPY;  Surgeon: Gaye Pollack, MD;  Location: [facility];  Service: Thoracic;  Laterality: N/A;   VIDEO BRONCHOSCOPY WITH ENDOBRONCHIAL ULTRASOUND N/A 03/07/2021   Procedure: VIDEO BRONCHOSCOPY WITH ENDOBRONCHIAL ULTRASOUND;  Surgeon: [name], MD;  Location: [facility];  Service: Thoracic;  Laterality: N/A;   WEDGE RESECTION Left 02/02/2019   Procedure: LUNG RESECTION;  Surgeon: Gaye Pollack, MD;  Location: [facility];  Service: Thoracic;  Laterality: Left;    REVIEW OF SYSTEMS:  Constitutional: positive for fatigue Eyes: negative Ears, nose, mouth, throat, and face: negative Respiratory: positive for cough and dyspnea on exertion Cardiovascular: negative Gastrointestinal: negative Genitourinary:negative Integument/breast: negative Hematologic/lymphatic: negative Musculoskeletal:negative Neurological: negative Behavioral/Psych: negative Endocrine: negative Allergic/Immunologic: negative     LABORATORY DATA: Lab Results  Component Value Date  WBC 5.3 03/30/2021   HGB 12.6 (L) 03/30/2021   HCT 38.3 (L) 03/30/2021   MCV 89.7 03/30/2021   PLT 114 (L) 03/30/2021      Chemistry      Component Value Date/Time   NA 138 03/30/2021 1626   NA 138 12/06/2019 1438   K 3.6 03/30/2021 1626   CL 108 03/30/2021 1626   CO2 23 03/30/2021 1626   BUN 16 03/30/2021 1626   BUN 14 12/06/2019 1438   CREATININE 0.66 03/30/2021 1626   CREATININE 0.95 03/12/2021 1219   CREATININE 0.81 11/29/2015 1335      Component Value Date/Time   CALCIUM 8.7 (L) 03/30/2021 1626   ALKPHOS 88 03/30/2021 1626   AST 23 03/30/2021 1626   AST 20 03/12/2021 1219   ALT 14 03/30/2021 1626   ALT 17 03/12/2021 1219   BILITOT 0.9 03/30/2021 1626   BILITOT 0.8 03/12/2021 1219       RADIOGRAPHIC STUDIES: DG Chest 2 View  Result Date: 03/30/2021 CLINICAL DATA:  Shortness of breath and cough. Lung  cancer. Recent COVID. EXAM: CHEST - 2 VIEW COMPARISON:  03/05/2021 chest radiograph and prior studies. FINDINGS: Cardiomegaly and aortic valve replacement again noted. Mid-lower LEFT lung interstitial prominence and LEFT pleural thickening again noted. There is no evidence of pleural effusion or pneumothorax. No other significant changes are identified. IMPRESSION: Cardiomegaly with unchanged LEFT pulmonary interstitial prominence and LEFT pleural thickening. Electronically Signed   By: Margarette Canada M.D.   On: 03/30/2021 16:12   DG Chest 2 View  Result Date: 03/10/2021 CLINICAL DATA:  Preop evaluation. EXAM: CHEST - 2 VIEW COMPARISON:  CT Chest 02/12/2021; X-ray Chest 02/22/2019. FINDINGS: Stable cardiomediastinal contours. Heart size is normal. No pleural effusion identified. Lingular opacity with pleural thickening/irregularity overlying the lateral aspect of the left midlung and left lower lobe is noted corresponding with the CT and PET findings. No superimposed airspace consolidation. Kyphoscoliosis deformity noted. Spondylosis noted within the thoracic spine. IMPRESSION: 1. No acute findings. 2. Stable lingular opacity and pleural thickening overlying the lateral left lung compatible with malignancy. Electronically Signed   By: Kerby Moors M.D.   On: 03/10/2021 13:10   MR Brain W Wo Contrast  Result Date: 03/21/2021 CLINICAL DATA:  Metastatic disease evaluation. Recurrent lung cancer. EXAM: MRI HEAD WITHOUT AND WITH CONTRAST TECHNIQUE: Multiplanar, multiecho pulse sequences of the brain and surrounding structures were obtained without and with intravenous contrast. CONTRAST:  68m GADAVIST GADOBUTROL 1 MMOL/ML IV SOLN COMPARISON:  No previous brain imaging. FINDINGS: Brain: The brain shows a background pattern of chronic small vessel ischemic changes of the pons and cerebral hemispheric white matter. There is a punctate deep white matter infarction adjacent to the anterior body of the left lateral  ventricle, presumably incidental. Question if there is punctate enhancement in this location. Therefore, this is indeterminate for small vessel infarction with associated enhancement versus punctate metastasis. There is no other focus abnormal enhancement or restricted diffusion. No focal cerebellar lesion is seen. Incidental mega cisterna magna. No large vessel territory infarction. No hydrocephalus or extra-axial collection. Vascular: Major vessels at the base of the brain show flow. Skull and upper cervical spine: Negative Sinuses/Orbits: Clear/normal Other: None IMPRESSION: Background pattern of moderate chronic small vessel ischemic change. Punctate focus of restricted diffusion with associated contrast enhancement in the white matter adjacent to the anterior body of the left lateral ventricle. The finding is indeterminate for incidentally detected small vessel infarction versus punctate metastasis. No second lesion suspicious for  metastasis is identified. This could be followed in 6-12 weeks to assess for change. Electronically Signed   By: Nelson Chimes M.D.   On: 03/21/2021 19:45    ASSESSMENT AND PLAN: This is a very pleasant 80 years old white male recently diagnosed with recurrent/metastatic non-small cell lung cancer, adenocarcinoma that was initially diagnosed as stage Ib (T2 a, N0, M0) adenocarcinoma in September 2020 status post wedge resection of the left lower lobe lung nodule and he has recurrence in September 2022 presented with metastatic mediastinal lymphadenopathy in addition to left hilar and left pleural metastatic disease. The patient had MRI of the brain performed recently.  I personally and independently reviewed the images and discussed the results with the patient and his daughter. We also send his tissue for molecular studies by [facility] but there was failure of the DNA extract to do the molecular studies.  PD-L1 expression is expected to result in few days. I had a lengthy  discussion with the patient and his daughter about his condition and treatment options. I will arrange for the patient to have a Guardant 360 blood test for the molecular studies done next week after he recovered from his COVID infection. I will arrange for the patient to come back for follow-up visit in around 2 weeks for evaluation and discussion of his treatment options in details based on the molecular studies and PD-L1 expression. The patient and his daughter agreed to the current plan. I discussed the assessment and treatment plan with the patient. The patient was provided an opportunity to ask questions and all were answered. The patient agreed with the plan and demonstrated an understanding of the instructions.   The patient was advised to call back or seek an in-person evaluation if the symptoms worsen or if the condition fails to improve as anticipated.  I provided 29 minutes of face-to-face video visit time during this encounter, and > 50% was spent counseling as documented under my assessment & plan.  Eilleen Kempf, MD 04/03/2021 2:43 PM  Disclaimer: This note was dictated with voice recognition software. Similar sounding words can inadvertently be transcribed and may not be corrected upon review.

## 2021-04-03 NOTE — Telephone Encounter (Signed)
PDL-1 in progress ,but [facility] cdx was cancelled due to DNA extraction failure

## 2021-04-07 ENCOUNTER — Telehealth: Payer: Self-pay | Admitting: Internal Medicine

## 2021-04-07 ENCOUNTER — Encounter (HOSPITAL_COMMUNITY): Payer: Self-pay | Admitting: Internal Medicine

## 2021-04-07 NOTE — Telephone Encounter (Signed)
Scheduled follow-up appointments per 11/3 los. Patient is aware.

## 2021-04-08 ENCOUNTER — Telehealth: Payer: Self-pay | Admitting: Medical Oncology

## 2021-04-08 NOTE — Telephone Encounter (Signed)
Diagnosed with COVID on 10/28. Please reschedule labs to 11/17 . Schedule message sent.

## 2021-04-09 ENCOUNTER — Telehealth: Payer: Self-pay | Admitting: Internal Medicine

## 2021-04-09 NOTE — Telephone Encounter (Signed)
Scheduled per sch msg. Called and spoke with patient. Confirmed appt  

## 2021-04-10 ENCOUNTER — Other Ambulatory Visit: Payer: Medicare Other

## 2021-04-16 ENCOUNTER — Encounter (HOSPITAL_COMMUNITY): Payer: Self-pay

## 2021-04-16 ENCOUNTER — Other Ambulatory Visit: Payer: Self-pay | Admitting: Physician Assistant

## 2021-04-17 ENCOUNTER — Inpatient Hospital Stay (HOSPITAL_BASED_OUTPATIENT_CLINIC_OR_DEPARTMENT_OTHER): Payer: Medicare Other | Admitting: Internal Medicine

## 2021-04-17 ENCOUNTER — Other Ambulatory Visit: Payer: Self-pay | Admitting: Medical Oncology

## 2021-04-17 ENCOUNTER — Encounter: Payer: Self-pay | Admitting: Internal Medicine

## 2021-04-17 ENCOUNTER — Other Ambulatory Visit: Payer: Self-pay

## 2021-04-17 ENCOUNTER — Ambulatory Visit: Payer: Medicare Other

## 2021-04-17 ENCOUNTER — Inpatient Hospital Stay: Payer: Medicare Other

## 2021-04-17 VITALS — BP 120/83 | HR 88 | Temp 97.5°F | Resp 16 | Ht 69.0 in | Wt 164.8 lb

## 2021-04-17 DIAGNOSIS — C3492 Malignant neoplasm of unspecified part of left bronchus or lung: Secondary | ICD-10-CM

## 2021-04-17 DIAGNOSIS — C61 Malignant neoplasm of prostate: Secondary | ICD-10-CM

## 2021-04-17 DIAGNOSIS — Z23 Encounter for immunization: Secondary | ICD-10-CM

## 2021-04-17 DIAGNOSIS — C782 Secondary malignant neoplasm of pleura: Secondary | ICD-10-CM | POA: Diagnosis not present

## 2021-04-17 DIAGNOSIS — Z5112 Encounter for antineoplastic immunotherapy: Secondary | ICD-10-CM | POA: Diagnosis not present

## 2021-04-17 DIAGNOSIS — Z5111 Encounter for antineoplastic chemotherapy: Secondary | ICD-10-CM | POA: Diagnosis not present

## 2021-04-17 DIAGNOSIS — R5383 Other fatigue: Secondary | ICD-10-CM | POA: Diagnosis not present

## 2021-04-17 DIAGNOSIS — C3412 Malignant neoplasm of upper lobe, left bronchus or lung: Secondary | ICD-10-CM | POA: Diagnosis not present

## 2021-04-17 MED ORDER — INFLUENZA VAC A&B SA ADJ QUAD 0.5 ML IM PRSY
0.5000 mL | PREFILLED_SYRINGE | Freq: Once | INTRAMUSCULAR | Status: AC
Start: 2021-04-17 — End: 2021-04-17
  Administered 2021-04-17: 13:00:00 0.5 mL via INTRAMUSCULAR
  Filled 2021-04-17: qty 0.5

## 2021-04-17 MED ORDER — PROCHLORPERAZINE MALEATE 10 MG PO TABS: 10.0000 mg | ORAL_TABLET | Freq: Four times a day (QID) | ORAL | 0 refills | Status: DC | PRN

## 2021-04-17 MED ORDER — FOLIC ACID 1 MG PO TABS: 1.0000 mg | ORAL_TABLET | Freq: Every day | ORAL | 4 refills | Status: DC

## 2021-04-17 MED ORDER — CYANOCOBALAMIN 1000 MCG/ML IJ SOLN
1000.0000 ug | Freq: Once | INTRAMUSCULAR | Status: AC
  Administered 2021-04-17: 13:00:00 1000 ug via INTRAMUSCULAR
  Filled 2021-04-17: qty 1

## 2021-04-17 NOTE — Progress Notes (Signed)
START OFF PATHWAY REGIMEN - Non-Small Cell Lung   [id number]:Pembrolizumab 200 mg  IV D1 + Pemetrexed 500 mg/m2 IV D1 + Carboplatin AUC=5 IV D1 q21 Days:   A cycle is every 21 days:     Pembrolizumab      Pemetrexed      Carboplatin   **Always confirm dose/schedule in your pharmacy ordering system**  Patient Characteristics: Stage IV Metastatic, Nonsquamous, Did Not Order Molecular Analysis/Quantity Not Sufficient for Molecular Analysis Therapeutic Status: Stage IV Metastatic Histology: Nonsquamous Cell Broad Molecular Profiling Status: Quantity Not Sufficient for Molecular Analysis  Intent of Therapy: Non-Curative / Palliative Intent, Discussed with Patient

## 2021-04-17 NOTE — Progress Notes (Signed)
[facility] [phone]   [phone]  OFFICE PROGRESS NOTE  [name], MD [address] [place] [address]  DIAGNOSIS: Recurrent/metastatic non-small cell lung cancer, adenocarcinoma initially diagnosed as a stage Ib (T2 a, N0, M0) adenocarcinoma in September 2020 status post wedge resection of the left upper lobe nodule.  The patient has disease recurrence and September 2022 with metastatic mediastinal lymphadenopathy as well as left hilar and left pleural metastatic disease   Molecular studies are still pending  PD-L1 expression was negative.  PRIOR THERAPY: None   CURRENT THERAPY: Systemic chemotherapy with carboplatin for AUC of 5, Alimta 500 Mg/M2 and Keytruda 200 Mg IV every 3 weeks.  First dose April 30, 2021 after the availability of the molecular studies by [employer].  INTERVAL HISTORY: Vincent Velez 80 y.o. male returns to the clinic today for follow-up visit accompanied by his grandson.  The patient is feeling fine today with no concerning complaints except for mild fatigue.  He was diagnosed with COVID-19 around 3 weeks ago.  He is feeling much better now and recovering well.  He continues to have mild shortness of breath with exertion.  He denied having any current chest pain, cough or hemoptysis.  He denied having any fever or chills he has no nausea, vomiting, diarrhea or constipation.  He has no headache or visual changes.  He has PD-L1 expression by [employer] that was reported to be negative but there was insufficient material for the additional molecular studies.  The patient is here today for evaluation and recommendation regarding treatment of his condition.  MEDICAL HISTORY: Past Medical History:  Diagnosis Date   AAA (abdominal aortic aneurysm)    Anxiety    Arthritis    Asthma    when younger   Atrial fibrillation (HCC)    CAD (coronary artery disease)    Cataract    removed both   COPD  (chronic obstructive pulmonary disease) (HCC)    Diabetes mellitus    Type II   Dysrhythmia    afib   Esophageal reflux    Family history of malignant neoplasm of gastrointestinal tract    Heart murmur    Hiatal hernia    Hyperlipemia    Hypertension    Lung nodule    a. PET scan highly suspicious for malignancy. Bronch with biopsy to be done after TAVR   Malignant neoplasm of prostate [facility])    prostate    Peripheral vascular disease ([place])    Pneumonia    Stricture and stenosis of esophagus     ALLERGIES:  is allergic to macrolides and ketolides.  MEDICATIONS:  Current Outpatient Medications  Medication Sig Dispense Refill   ACCU-CHEK GUIDE test strip USE TO CHECK YOUR BLOOD SUGAR ONCE DAILY     acetaminophen (TYLENOL) 500 MG tablet Take 2 tablets (1,000 mg total) by mouth every 6 (six) hours as needed for mild pain or fever. 30 tablet 0   diltiazem (CARDIZEM SR) 60 MG 12 hr capsule Take 1 capsule (60 mg total) by mouth every 12 (twelve) hours. 60 capsule 7   dorzolamide-timolol (COSOPT) 22.3-6.8 MG/ML ophthalmic solution Place 1 drop into both eyes in the morning, at noon, and at bedtime.     glimepiride (AMARYL) 2 MG tablet Take 2 mg by mouth daily with breakfast.     JARDIANCE 25 MG TABS tablet Take 25 mg by mouth daily.     LAGEVRIO 200  MG CAPS capsule Take 4 capsules by mouth 2 (two) times daily.     metFORMIN (GLUCOPHAGE-XR) 500 MG 24 hr tablet Take 1,000 mg by mouth every evening.      metoprolol succinate (TOPROL-XL) 50 MG 24 hr tablet Take 50 mg by mouth daily.     Multiple Vitamins-Minerals (MULTIVITAMIN WITH MINERALS) tablet Take 1 tablet by mouth daily.     Multiple Vitamins-Minerals (ZINC PO) Take 1 tablet by mouth daily.     pantoprazole (PROTONIX) 40 MG tablet Take 40 mg by mouth daily.     simvastatin (ZOCOR) 40 MG tablet Take 40 mg by mouth at bedtime.     XARELTO 20 MG TABS tablet TAKE 1 TABLET BY MOUTH  DAILY WITH SUPPER 90 tablet 1   No current  facility-administered medications for this visit.    SURGICAL HISTORY:  Past Surgical History:  Procedure Laterality Date   ABDOMINAL AORTA STENT     CARDIAC CATHETERIZATION     CARDIOVERSION N/A 12/04/2015   Procedure: CARDIOVERSION;  Surgeon: Sanda Klein, MD;  Location: [facility];  Service: Cardiovascular;  Laterality: N/A;   COLONOSCOPY     FINGER SURGERY Right    middle finger- amputation   INSERTION PROSTATE RADIATION SEED     MEDIASTINOSCOPY N/A 01/06/2019   Procedure: MEDIASTINOSCOPY WITH BIOPIES;  Surgeon: Gaye Pollack, MD;  Location: [facility];  Service: Thoracic;  Laterality: N/A;   RIGHT/LEFT HEART CATH AND CORONARY ANGIOGRAPHY N/A 10/26/2018   Procedure: RIGHT/LEFT HEART CATH AND CORONARY ANGIOGRAPHY;  Surgeon: Burnell Blanks, MD;  Location: [facility];  Service: Cardiovascular;  Laterality: N/A;   TEE WITHOUT CARDIOVERSION N/A 12/06/2018   Procedure: TRANSESOPHAGEAL ECHOCARDIOGRAM (TEE);  Surgeon: Burnell Blanks, MD;  Location: [facility];  Service: Open Heart Surgery;  Laterality: N/A;   TRANSCATHETER AORTIC VALVE REPLACEMENT, TRANSFEMORAL N/A 12/06/2018   Procedure: TRANSCATHETER AORTIC VALVE REPLACEMENT, TRANSFEMORAL;  Surgeon: Burnell Blanks, MD;  Location: [facility];  Service: Open Heart Surgery;  Laterality: N/A;   UPPER GASTROINTESTINAL ENDOSCOPY     VIDEO ASSISTED THORACOSCOPY (VATS)/THOROCOTOMY Left 02/02/2019   Procedure: VIDEO ASSISTED THORACOSCOPY (VATS)/THOROCOTOMY;  Surgeon: Gaye Pollack, MD;  Location: [facility];  Service: Thoracic;  Laterality: Left;   VIDEO BRONCHOSCOPY N/A 01/06/2019   Procedure: VIDEO BRONCHOSCOPY;  Surgeon: Gaye Pollack, MD;  Location: [facility];  Service: Thoracic;  Laterality: N/A;   VIDEO BRONCHOSCOPY WITH ENDOBRONCHIAL ULTRASOUND N/A 03/07/2021   Procedure: VIDEO BRONCHOSCOPY WITH ENDOBRONCHIAL ULTRASOUND;  Surgeon: [name], MD;  Location: [facility];  Service: Thoracic;  Laterality:  N/A;   WEDGE RESECTION Left 02/02/2019   Procedure: LUNG RESECTION;  Surgeon: Gaye Pollack, MD;  Location: [facility];  Service: Thoracic;  Laterality: Left;    REVIEW OF SYSTEMS:  Constitutional: positive for fatigue Eyes: negative Ears, nose, mouth, throat, and face: negative Respiratory: positive for dyspnea on exertion Cardiovascular: negative Gastrointestinal: negative Genitourinary:negative Integument/breast: negative Hematologic/lymphatic: negative Musculoskeletal:negative Neurological: negative Behavioral/Psych: negative Endocrine: negative Allergic/Immunologic: negative   PHYSICAL EXAMINATION: General appearance: alert, cooperative, fatigued, and no distress Head: Normocephalic, without obvious abnormality, atraumatic Neck: no adenopathy, no JVD, supple, symmetrical, trachea midline, and thyroid not enlarged, symmetric, no tenderness/mass/nodules Lymph nodes: Cervical, supraclavicular, and axillary nodes normal. Resp: clear to auscultation bilaterally Back: symmetric, no curvature. ROM normal. No CVA tenderness. Cardio: regular rate and rhythm, S1, S2 normal, no murmur, click, rub or gallop GI: soft, non-tender; bowel sounds normal; no masses,  no organomegaly  Extremities: extremities normal, atraumatic, no cyanosis or edema Neurologic: Alert and oriented X 3, normal strength and tone. Normal symmetric reflexes. Normal coordination and gait  ECOG PERFORMANCE STATUS: 1 - Symptomatic but completely ambulatory  Blood pressure 120/83, pulse 88, temperature (!) 97.5 F (36.4 C), temperature source Tympanic, resp. rate 16, height '5\' 9"'  (1.753 m), weight 164 lb 12.8 oz (74.8 kg), SpO2 98 %.  LABORATORY DATA: Lab Results  Component Value Date   WBC 5.3 03/30/2021   HGB 12.6 (L) 03/30/2021   HCT 38.3 (L) 03/30/2021   MCV 89.7 03/30/2021   PLT 114 (L) 03/30/2021      Chemistry      Component Value Date/Time   NA 138 03/30/2021 1626   NA 138 12/06/2019 1438   K 3.6  03/30/2021 1626   CL 108 03/30/2021 1626   CO2 23 03/30/2021 1626   BUN 16 03/30/2021 1626   BUN 14 12/06/2019 1438   CREATININE 0.66 03/30/2021 1626   CREATININE 0.95 03/12/2021 1219   CREATININE 0.81 11/29/2015 1335      Component Value Date/Time   CALCIUM 8.7 (L) 03/30/2021 1626   ALKPHOS 88 03/30/2021 1626   AST 23 03/30/2021 1626   AST 20 03/12/2021 1219   ALT 14 03/30/2021 1626   ALT 17 03/12/2021 1219   BILITOT 0.9 03/30/2021 1626   BILITOT 0.8 03/12/2021 1219       RADIOGRAPHIC STUDIES: DG Chest 2 View  Result Date: 03/30/2021 CLINICAL DATA:  Shortness of breath and cough. Lung cancer. Recent COVID. EXAM: CHEST - 2 VIEW COMPARISON:  03/05/2021 chest radiograph and prior studies. FINDINGS: Cardiomegaly and aortic valve replacement again noted. Mid-lower LEFT lung interstitial prominence and LEFT pleural thickening again noted. There is no evidence of pleural effusion or pneumothorax. No other significant changes are identified. IMPRESSION: Cardiomegaly with unchanged LEFT pulmonary interstitial prominence and LEFT pleural thickening. Electronically Signed   By: Margarette Canada M.D.   On: 03/30/2021 16:12   MR Brain W Wo Contrast  Result Date: 03/21/2021 CLINICAL DATA:  Metastatic disease evaluation. Recurrent lung cancer. EXAM: MRI HEAD WITHOUT AND WITH CONTRAST TECHNIQUE: Multiplanar, multiecho pulse sequences of the brain and surrounding structures were obtained without and with intravenous contrast. CONTRAST:  22m GADAVIST GADOBUTROL 1 MMOL/ML IV SOLN COMPARISON:  No previous brain imaging. FINDINGS: Brain: The brain shows a background pattern of chronic small vessel ischemic changes of the pons and cerebral hemispheric white matter. There is a punctate deep white matter infarction adjacent to the anterior body of the left lateral ventricle, presumably incidental. Question if there is punctate enhancement in this location. Therefore, this is indeterminate for small vessel  infarction with associated enhancement versus punctate metastasis. There is no other focus abnormal enhancement or restricted diffusion. No focal cerebellar lesion is seen. Incidental mega cisterna magna. No large vessel territory infarction. No hydrocephalus or extra-axial collection. Vascular: Major vessels at the base of the brain show flow. Skull and upper cervical spine: Negative Sinuses/Orbits: Clear/normal Other: None IMPRESSION: Background pattern of moderate chronic small vessel ischemic change. Punctate focus of restricted diffusion with associated contrast enhancement in the white matter adjacent to the anterior body of the left lateral ventricle. The finding is indeterminate for incidentally detected small vessel infarction versus punctate metastasis. No second lesion suspicious for metastasis is identified. This could be followed in 6-12 weeks to assess for change. Electronically Signed   By: MNelson ChimesM.D.   On: 03/21/2021 19:45    ASSESSMENT AND PLAN:  This is a very pleasant 80 years old white male recently diagnosed with recurrent/metastatic non-small cell lung cancer, adenocarcinoma that was initially diagnosed as stage Ib (T2 a, N0, M0) adenocarcinoma in September 2020 status post wedge resection of the left lower lobe lung nodule and he has recurrence in September 2022 presented with metastatic mediastinal lymphadenopathy in addition to left hilar and left pleural metastatic disease. The patient had molecular studies by [employer] but there was insufficient material for the molecular test.  His PD-L1 expression was negative. I recommended for the patient to have molecular studies by [employer] by the blood test. I also discussed with the patient his treatment options if the molecular studies are negative for actionable mutations including systemic chemotherapy with carboplatin for AUC of 5, Alimta 500 Mg/M2 and Keytruda 200 Mg IV every 3 weeks versus palliative care and hospice  referral.  The patient is interested in treatment. I discussed with him the adverse effect of this treatment including but not limited to alopecia, myelosuppression, nausea and vomiting, peripheral neuropathy, liver or renal dysfunction as well as immunotherapy adverse effects. He is expected to start the first cycle of this treatment on April 30, 2021 and hopefully the molecular studies will be available by that time before starting the first dose of his treatment. I will arrange for the patient to receive vitamin B12 injection today. I will call his pharmacy with prescription for Compazine 10 mg p.o. every 6 hours as needed for nausea as well as folic acid 1 mg p.o. daily. He will have a chemotherapy education class before the first dose of his treatment. The patient will come back for follow-up visit with the start of the first dose of his treatment to discuss the molecular studies before his first dose. He was advised to call immediately if he has any other concerning symptoms in the interval. The patient voices understanding of current disease status and treatment options and is in agreement with the current care plan.  All questions were answered. The patient knows to call the clinic with any problems, questions or concerns. We can certainly see the patient much sooner if necessary.  The total time spent in the appointment was 35 minutes.  Disclaimer: This note was dictated with voice recognition software. Similar sounding words can inadvertently be transcribed and may not be corrected upon review.

## 2021-04-28 ENCOUNTER — Other Ambulatory Visit: Payer: Self-pay

## 2021-04-28 ENCOUNTER — Inpatient Hospital Stay: Payer: Medicare Other

## 2021-04-28 NOTE — Progress Notes (Signed)
[facility] OFFICE PROGRESS NOTE  [name], MD [place] [place] [address]  DIAGNOSIS: Recurrent/metastatic non-small cell lung cancer, adenocarcinoma initially diagnosed as a stage Ib (T2 a, N0, M0) adenocarcinoma in September 2020 status post wedge resection of the left upper lobe nodule.  The patient has disease recurrence and September 2022 with metastatic mediastinal lymphadenopathy as well as left hilar and left pleural metastatic disease   PD-L1 expression was negative.  Molecular Studies: Insufficient genetic material on guardant 360 and for foundation 1  PRIOR THERAPY: None  CURRENT THERAPY: Systemic chemotherapy with carboplatin for AUC of 5, Alimta 500 Mg/M2 and Keytruda 200 Mg IV every 3 weeks.  First dose April 30, 2021.   INTERVAL HISTORY: Vincent Velez 80 y.o. male returns to the clinic today for a follow-up visit accompanied by his daughter.  Unfortunately, the patient was found to recently have recurrent lung cancer.  The plan was to proceed with systemic chemotherapy today if the patient did not have any actionable mutations.  The results of his molecular studies did not show any actionable mutations since there was insufficient tissue to run foundation 1 testing and low circulating tumor DNA and guardant 360 liquid biopsy testing.  Overall, the patient is feeling fairly well today. He has some baseline shortness of breath with exertion but otherwise denies shortness of breath with rest and normal activities. He states his breathing has not significantly changed since he was last seen.  He denies any recent fever, chills, night sweats, or recent unexplained weight loss.  Over the last two years he dropped weight but gained recently. He reports denies cough.  He denies any chest pain.  He states he has had a few episodes of hemoptysis but they have "tapered off" recently.  He states that at first there was a small area of blood in  his sputum and then it transitioned to "marbling".  He has not seen any blood in his sputum recently.  Of note, the patient is on a blood thinner.  He denies any nausea, vomiting, or constipation. He sometimes has diarrhea and had an episode 1 day last week which was attributed to a muffin that he ate.  He denies any headaches.  He needs some dental work done and is wondering when he can have this performed.  He is here today for evaluation and repeat blood work before starting cycle #1.  MEDICAL HISTORY: Past Medical History:  Diagnosis Date   AAA (abdominal aortic aneurysm)    Anxiety    Arthritis    Asthma    when younger   Atrial fibrillation (HCC)    CAD (coronary artery disease)    Cataract    removed both   COPD (chronic obstructive pulmonary disease) (HCC)    Diabetes mellitus    Type II   Dysrhythmia    afib   Esophageal reflux    Family history of malignant neoplasm of gastrointestinal tract    Heart murmur    Hiatal hernia    Hyperlipemia    Hypertension    Lung nodule    a. PET scan highly suspicious for malignancy. Bronch with biopsy to be done after TAVR   Malignant neoplasm of prostate [facility])    prostate    Peripheral vascular disease ([facility])    Pneumonia    Stricture and stenosis of esophagus     ALLERGIES:  is allergic to macrolides and ketolides.  MEDICATIONS:  Current Outpatient Medications  Medication Sig Dispense Refill   ACCU-CHEK GUIDE test strip USE TO CHECK YOUR BLOOD SUGAR ONCE DAILY     acetaminophen (TYLENOL) 500 MG tablet Take 2 tablets (1,000 mg total) by mouth every 6 (six) hours as needed for mild pain or fever. 30 tablet 0   diltiazem (CARDIZEM SR) 60 MG 12 hr capsule Take 1 capsule (60 mg total) by mouth every 12 (twelve) hours. 60 capsule 7   dorzolamide-timolol (COSOPT) 22.3-6.8 MG/ML ophthalmic solution Place 1 drop into both eyes in the morning, at noon, and at bedtime.     folic acid (FOLVITE) 1 MG tablet Take 1 tablet (1 mg total) by  mouth daily. 30 tablet 4   glimepiride (AMARYL) 2 MG tablet Take 2 mg by mouth daily with breakfast.     JARDIANCE 25 MG TABS tablet Take 25 mg by mouth daily.     metFORMIN (GLUCOPHAGE-XR) 500 MG 24 hr tablet Take 1,000 mg by mouth every evening.      metoprolol succinate (TOPROL-XL) 50 MG 24 hr tablet Take 50 mg by mouth daily.     Multiple Vitamins-Minerals (MULTIVITAMIN WITH MINERALS) tablet Take 1 tablet by mouth daily.     Multiple Vitamins-Minerals (ZINC PO) Take 1 tablet by mouth daily.     pantoprazole (PROTONIX) 40 MG tablet Take 40 mg by mouth daily.     prochlorperazine (COMPAZINE) 10 MG tablet Take 1 tablet (10 mg total) by mouth every 6 (six) hours as needed for nausea or vomiting. 30 tablet 0   simvastatin (ZOCOR) 40 MG tablet Take 40 mg by mouth at bedtime.     XARELTO 20 MG TABS tablet TAKE 1 TABLET BY MOUTH  DAILY WITH SUPPER 90 tablet 1   No current facility-administered medications for this visit.    SURGICAL HISTORY:  Past Surgical History:  Procedure Laterality Date   ABDOMINAL AORTA STENT     CARDIAC CATHETERIZATION     CARDIOVERSION N/A 12/04/2015   Procedure: CARDIOVERSION;  Surgeon: Sanda Klein, MD;  Location: [facility];  Service: Cardiovascular;  Laterality: N/A;   COLONOSCOPY     FINGER SURGERY Right    middle finger- amputation   INSERTION PROSTATE RADIATION SEED     MEDIASTINOSCOPY N/A 01/06/2019   Procedure: MEDIASTINOSCOPY WITH BIOPIES;  Surgeon: Gaye Pollack, MD;  Location: [facility];  Service: Thoracic;  Laterality: N/A;   RIGHT/LEFT HEART CATH AND CORONARY ANGIOGRAPHY N/A 10/26/2018   Procedure: RIGHT/LEFT HEART CATH AND CORONARY ANGIOGRAPHY;  Surgeon: Burnell Blanks, MD;  Location: [facility];  Service: Cardiovascular;  Laterality: N/A;   TEE WITHOUT CARDIOVERSION N/A 12/06/2018   Procedure: TRANSESOPHAGEAL ECHOCARDIOGRAM (TEE);  Surgeon: Burnell Blanks, MD;  Location: [facility];  Service: Open Heart Surgery;   Laterality: N/A;   TRANSCATHETER AORTIC VALVE REPLACEMENT, TRANSFEMORAL N/A 12/06/2018   Procedure: TRANSCATHETER AORTIC VALVE REPLACEMENT, TRANSFEMORAL;  Surgeon: Burnell Blanks, MD;  Location: [facility];  Service: Open Heart Surgery;  Laterality: N/A;   UPPER GASTROINTESTINAL ENDOSCOPY     VIDEO ASSISTED THORACOSCOPY (VATS)/THOROCOTOMY Left 02/02/2019   Procedure: VIDEO ASSISTED THORACOSCOPY (VATS)/THOROCOTOMY;  Surgeon: Gaye Pollack, MD;  Location: [facility];  Service: Thoracic;  Laterality: Left;   VIDEO BRONCHOSCOPY N/A 01/06/2019   Procedure: VIDEO BRONCHOSCOPY;  Surgeon: Gaye Pollack, MD;  Location: [facility];  Service: Thoracic;  Laterality: N/A;   VIDEO BRONCHOSCOPY WITH ENDOBRONCHIAL ULTRASOUND N/A 03/07/2021   Procedure: VIDEO BRONCHOSCOPY WITH ENDOBRONCHIAL ULTRASOUND;  Surgeon: Modesto Charon  C, MD;  Location: [facility];  Service: Thoracic;  Laterality: N/A;   WEDGE RESECTION Left 02/02/2019   Procedure: LUNG RESECTION;  Surgeon: Gaye Pollack, MD;  Location: [facility];  Service: Thoracic;  Laterality: Left;    REVIEW OF SYSTEMS:   Review of Systems  Constitutional: Negative for appetite change, chills, fatigue, fever and unexpected weight change.  HENT: Negative for mouth sores, nosebleeds, sore throat and trouble swallowing.   Eyes: Negative for eye problems and icterus.  Respiratory: Positive for baseline dyspnea on exertion.  Positive for intermittent episodes of blood-tinged sputum.  Negative for cough, shortness of breath and wheezing.   Cardiovascular: Negative for chest pain and leg swelling.  Gastrointestinal: Negative for abdominal pain, constipation, diarrhea, nausea and vomiting.  Genitourinary: Negative for bladder incontinence, difficulty urinating, dysuria, frequency and hematuria.   Musculoskeletal: Negative for back pain, gait problem, neck pain and neck stiffness.  Skin: Negative for itching and rash.  Neurological: Negative for dizziness, extremity  weakness, gait problem, headaches, light-headedness and seizures.  Hematological: Negative for adenopathy. Does not bruise/bleed easily.  Psychiatric/Behavioral: Negative for confusion, depression and sleep disturbance. The patient is not nervous/anxious.     PHYSICAL EXAMINATION:  Blood pressure 120/77, pulse 91, temperature 97.6 F (36.4 C), temperature source Tympanic, resp. rate 18, height _0  (1.753 m), weight 170 lb 9.6 oz (77.4 kg), SpO2 98 %.  ECOG PERFORMANCE STATUS: 1  Physical Exam  Constitutional: Oriented to person, place, and time and well-developed, well-nourished, and in no distress.  HENT:  Head: Normocephalic and atraumatic.  Mouth/Throat: Oropharynx is clear and moist. No oropharyngeal exudate.  Eyes: Conjunctivae are normal. Right eye exhibits no discharge. Left eye exhibits no discharge. No scleral icterus.  Neck: Normal range of motion. Neck supple.  Cardiovascular: Normal rate, regular rhythm, systolic murmur noted.  And intact distal pulses.   Pulmonary/Chest: Effort normal and breath sounds normal. No respiratory distress. No wheezes. No rales.  Abdominal: Soft. Bowel sounds are normal. Exhibits no distension and no mass. There is no tenderness.  Musculoskeletal: Normal range of motion. Exhibits no edema.  Lymphadenopathy:    No cervical adenopathy.  Neurological: Alert and oriented to person, place, and time. Exhibits normal muscle tone. Gait normal. Coordination normal.  Skin: Skin is warm and dry. No rash noted. Not diaphoretic. No erythema. No pallor.  Psychiatric: Mood, memory and judgment normal.  Vitals reviewed.  LABORATORY DATA: Lab Results  Component Value Date   WBC 8.6 04/30/2021   HGB 12.6 (L) 04/30/2021   HCT 37.9 (L) 04/30/2021   MCV 87.3 04/30/2021   PLT 149 (L) 04/30/2021      Chemistry      Component Value Date/Time   NA 136 04/30/2021 0724   NA 138 12/06/2019 1438   K 3.8 04/30/2021 0724   CL 104 04/30/2021 0724   CO2 21 (L)  04/30/2021 0724   BUN 21 04/30/2021 0724   BUN 14 12/06/2019 1438   CREATININE 0.84 04/30/2021 0724   CREATININE 0.81 11/29/2015 1335      Component Value Date/Time   CALCIUM 9.3 04/30/2021 0724   ALKPHOS 96 04/30/2021 0724   AST 22 04/30/2021 0724   ALT 16 04/30/2021 0724   BILITOT 0.5 04/30/2021 0724       RADIOGRAPHIC STUDIES:  No results found.   ASSESSMENT/PLAN:   This is a very pleasant 80 year old white male recently diagnosed with recurrent/metastatic non-small cell lung cancer, adenocarcinoma that was initially diagnosed as stage Ib (  T2 a, N0, M0) adenocarcinoma in September 2020 status post wedge resection of the left lower lobe lung nodule and he has recurrence in September 2022 presented with metastatic mediastinal lymphadenopathy in addition to left hilar and left pleural metastatic disease.  The patient had molecular studies performed by foundation 1 but there was insufficient material for the molecular test.  His PD-L1 expression was negative.  The patient did not have enough tissue to run foundation 1 molecular studies. The patient's guardant 360 blood molecular testing also did not have enough circulating tumor DNA to run this test.  We can always consider repeating this test in the future or running foundation 1 if the patient has any malignant pleural effusion in the future.  The patient was seen with Dr. Julien Nordmann today.  Dr. Julien Nordmann reviewed the results of the patient's molecular studies.  Dr. Julien Nordmann recommend that the patient continue with systemic chemotherapy as planned with carboplatin for an AUC of 5, Alimta 500 mg per metered square, Keytruda 200 mg IV every 3 weeks.   We will see the patient back for follow-up visit in 1 week for evaluation before starting cycle #2.  Advised the patient to hold off on any dental work at this time.  Discussed that we can rediscuss this when he starts maintenance treatment with Alimta and Keytruda.  The patient's initial brain  MRI was indeterminant for a small punctate metastasis versus a small vessel infarction and recommended follow-up in 6 to 12 weeks.  I will place a repeat brain MRI order to be performed in the next few weeks.  Regarding the patient's blood-tinged sputum, he reports that this has been tapering off recently.  Advised the patient to keep an eye on this for now.  Discussed if the patient ever develops frank hemoptysis that he needs to be evaluated immediately in the emergency room.   The patient was advised to call immediately if he has any concerning symptoms in the interval. The patient voices understanding of current disease status and treatment options and is in agreement with the current care plan. All questions were answered. The patient knows to call the clinic with any problems, questions or concerns. We can certainly see the patient much sooner if necessary      Orders Placed This Encounter  Procedures   MR Brain W Wo Contrast    Standing Status:   Future    Standing Expiration Date:   04/30/2022    Order Specific Question:   If indicated for the ordered procedure, I authorize the administration of contrast media per Radiology protocol    Answer:   Yes    Order Specific Question:   What is the patient's sedation requirement?    Answer:   No Sedation    Order Specific Question:   Does the patient have a pacemaker or implanted devices?    Answer:   No    Order Specific Question:   Use SRS Protocol?    Answer:   No    Order Specific Question:   Preferred imaging location?    Answer:   [facility] (table limit - 550 lbs)   CBC with Differential ([facility] Only)    Standing Status:   Standing    Number of Occurrences:   12    Standing Expiration Date:   04/30/2022   CMP ([facility] only)    Standing Status:   Standing    Number of Occurrences:   12    Standing  Expiration Date:   04/30/2022   TSH    Standing Status:   Standing    Number of Occurrences:   5     Standing Expiration Date:   04/30/2022       Tobe Sos Carmon Sahli, PA-C 04/30/21  ADDENDUM: Hematology/Oncology Attending: I had a face-to-face encounter with the patient today.  I reviewed his record, lab, scan and recommended his care plan.  With recurrent non-small cell lung cancer that was initially diagnosed as a stage Ib (T2 a, N0, M0) adenocarcinoma in September 2020 status post wedge resection of the left lower lobe lung nodule and the patient has recurrence in September 2022 presented with metastatic mediastinal lymphadenopathy in addition to left hilar and left pleural metastatic disease.  Molecular studies by Guardant 360 showed no detectable tumor DNA and the test was a failure.  He also has PD-L1 expression by foundation 1 that was negative. The patient is here today for evaluation and discussion of his treatment options. I had a lengthy discussion with the patient and his wife today about his condition. He was given the option of palliative care and close monitoring versus palliative systemic chemotherapy with carboplatin for AUC of 5, Alimta 500 Mg/M2 and Keytruda 200 Mg IV every 3 weeks. The patient is interested in the treatment. We reminded him of the adverse effect of this treatment. He will proceed with the first cycle of his treatment today as planned. He will come back for follow-up visit in 1 week for evaluation and management of any adverse effect of his treatment. The patient was advised to call immediately if he has any other concerning symptoms in the interval. The total time spent in the appointment was 30 minutes. Disclaimer: This note was dictated with voice recognition software. Similar sounding words can inadvertently be transcribed and may be missed upon review. Eilleen Kempf, MD 04/30/21

## 2021-04-29 ENCOUNTER — Encounter: Payer: Self-pay | Admitting: Internal Medicine

## 2021-04-29 ENCOUNTER — Other Ambulatory Visit: Payer: Self-pay

## 2021-04-29 DIAGNOSIS — C3492 Malignant neoplasm of unspecified part of left bronchus or lung: Secondary | ICD-10-CM

## 2021-04-29 MED FILL — Dexamethasone Sodium Phosphate Inj 100 MG/10ML: INTRAMUSCULAR | Qty: 1 | Status: AC

## 2021-04-29 MED FILL — Fosaprepitant Dimeglumine For IV Infusion 150 MG (Base Eq): INTRAVENOUS | Qty: 5 | Status: AC

## 2021-04-29 NOTE — Progress Notes (Signed)
Called pt to introduce myself as his Arboriculturist and to discuss the [employer].  Pt has 2 insurances so copay assistance shouldn't be needed.  Pt was unavailable so I spoke to his daughter, Colletta [place] and informed her of the [employer], went over what it covers and gave her the income requirement.  She stated she thinks pt would like to apply so they will let me know on 04/30/21 and will bring proof of income at that time.  If approved I will give him an expense sheet and my card for any questions or concerns he may have in the future.

## 2021-04-30 ENCOUNTER — Other Ambulatory Visit: Payer: Self-pay

## 2021-04-30 ENCOUNTER — Inpatient Hospital Stay: Payer: Medicare Other

## 2021-04-30 ENCOUNTER — Inpatient Hospital Stay (HOSPITAL_BASED_OUTPATIENT_CLINIC_OR_DEPARTMENT_OTHER): Payer: Medicare Other | Admitting: Physician Assistant

## 2021-04-30 ENCOUNTER — Encounter: Payer: Self-pay | Admitting: Internal Medicine

## 2021-04-30 VITALS — BP 120/77 | HR 91 | Temp 97.6°F | Resp 18 | Ht 69.0 in | Wt 170.6 lb

## 2021-04-30 DIAGNOSIS — C3490 Malignant neoplasm of unspecified part of unspecified bronchus or lung: Secondary | ICD-10-CM | POA: Diagnosis not present

## 2021-04-30 DIAGNOSIS — R5383 Other fatigue: Secondary | ICD-10-CM | POA: Diagnosis not present

## 2021-04-30 DIAGNOSIS — C782 Secondary malignant neoplasm of pleura: Secondary | ICD-10-CM | POA: Diagnosis not present

## 2021-04-30 DIAGNOSIS — Z5111 Encounter for antineoplastic chemotherapy: Secondary | ICD-10-CM

## 2021-04-30 DIAGNOSIS — Z5112 Encounter for antineoplastic immunotherapy: Secondary | ICD-10-CM

## 2021-04-30 DIAGNOSIS — C3492 Malignant neoplasm of unspecified part of left bronchus or lung: Secondary | ICD-10-CM

## 2021-04-30 DIAGNOSIS — C3412 Malignant neoplasm of upper lobe, left bronchus or lung: Secondary | ICD-10-CM | POA: Diagnosis not present

## 2021-04-30 DIAGNOSIS — Z23 Encounter for immunization: Secondary | ICD-10-CM | POA: Diagnosis not present

## 2021-04-30 LAB — CMP (CANCER CENTER ONLY)
ALT: 16 U/L (ref 0–44)
AST: 22 U/L (ref 15–41)
Albumin: 4.1 g/dL (ref 3.5–5.0)
Alkaline Phosphatase: 96 U/L (ref 38–126)
Anion gap: 11 (ref 5–15)
BUN: 21 mg/dL (ref 8–23)
CO2: 21 mmol/L — ABNORMAL LOW (ref 22–32)
Calcium: 9.3 mg/dL (ref 8.9–10.3)
Chloride: 104 mmol/L (ref 98–111)
Creatinine: 0.84 mg/dL (ref 0.61–1.24)
GFR, Estimated: >60 mL/min (ref >60)
Glucose, Bld: 207 mg/dL — ABNORMAL HIGH (ref 70–99)
Potassium: 3.8 mmol/L (ref 3.5–5.1)
Sodium: 136 mmol/L (ref 135–145)
Total Bilirubin: 0.5 mg/dL (ref 0.3–1.2)
Total Protein: 7.5 g/dL (ref 6.5–8.1)

## 2021-04-30 LAB — CBC WITH DIFFERENTIAL (CANCER CENTER ONLY)
Abs Immature Granulocytes: 0.07 10*3/uL (ref 0.00–0.07)
Basophils Absolute: 0.1 10*3/uL (ref 0.0–0.1)
Basophils Relative: 1 %
Eosinophils Absolute: 0.4 10*3/uL (ref 0.0–0.5)
Eosinophils Relative: 5 %
HCT: 37.9 % — ABNORMAL LOW (ref 39.0–52.0)
Hemoglobin: 12.6 g/dL — ABNORMAL LOW (ref 13.0–17.0)
Immature Granulocytes: 1 %
Lymphocytes Relative: 22 %
Lymphs Abs: 1.9 10*3/uL (ref 0.7–4.0)
MCH: 29 pg (ref 26.0–34.0)
MCHC: 33.2 g/dL (ref 30.0–36.0)
MCV: 87.3 fL (ref 80.0–100.0)
Monocytes Absolute: 0.9 10*3/uL (ref 0.1–1.0)
Monocytes Relative: 11 %
Neutro Abs: 5.3 10*3/uL (ref 1.7–7.7)
Neutrophils Relative %: 60 %
Platelet Count: 149 10*3/uL — ABNORMAL LOW (ref 150–400)
RBC: 4.34 MIL/uL (ref 4.22–5.81)
RDW: 14.3 % (ref 11.5–15.5)
WBC Count: 8.6 10*3/uL (ref 4.0–10.5)
nRBC: 0 % (ref 0.0–0.2)

## 2021-04-30 MED ORDER — SODIUM CHLORIDE 0.9 % IV SOLN
500.0000 mg/m2 | Freq: Once | INTRAVENOUS | Status: AC
  Administered 2021-04-30: 1000 mg via INTRAVENOUS
  Filled 2021-04-30: qty 40

## 2021-04-30 MED ORDER — SODIUM CHLORIDE 0.9 % IV SOLN: Freq: Once | INTRAVENOUS | Status: AC

## 2021-04-30 MED ORDER — SODIUM CHLORIDE 0.9 % IV SOLN
10.0000 mg | Freq: Once | INTRAVENOUS | Status: AC
  Administered 2021-04-30: 10 mg via INTRAVENOUS
  Filled 2021-04-30: qty 10

## 2021-04-30 MED ORDER — SODIUM CHLORIDE 0.9 % IV SOLN
200.0000 mg | Freq: Once | INTRAVENOUS | Status: AC
  Administered 2021-04-30: 200 mg via INTRAVENOUS
  Filled 2021-04-30: qty 8

## 2021-04-30 MED ORDER — SODIUM CHLORIDE 0.9 % IV SOLN
447.0000 mg | Freq: Once | INTRAVENOUS | Status: AC
  Administered 2021-04-30: 450 mg via INTRAVENOUS
  Filled 2021-04-30: qty 45

## 2021-04-30 MED ORDER — SODIUM CHLORIDE 0.9 % IV SOLN
150.0000 mg | Freq: Once | INTRAVENOUS | Status: AC
  Administered 2021-04-30: 150 mg via INTRAVENOUS
  Filled 2021-04-30: qty 150

## 2021-04-30 MED ORDER — PALONOSETRON HCL INJECTION 0.25 MG/5ML
0.2500 mg | Freq: Once | INTRAVENOUS | Status: AC
  Administered 2021-04-30: 0.25 mg via INTRAVENOUS
  Filled 2021-04-30: qty 5

## 2021-04-30 NOTE — Patient Instructions (Addendum)
[facility]  Discharge Instructions: Thank you for choosing [facility] to provide your oncology and hematology care.   If you have a lab appointment with the [facility], please go directly to the [facility] and check in at the registration area.   Wear comfortable clothing and clothing appropriate for easy access to any Portacath or PICC line.   We strive to give you quality time with your provider. You may need to reschedule your appointment if you arrive late (15 or more minutes).  Arriving late affects you and other patients whose appointments are after yours.  Also, if you miss three or more appointments without notifying the office, you may be dismissed from the clinic at the provider's discretion.      For prescription refill requests, have your pharmacy contact our office and allow 72 hours for refills to be completed.    Today you received the following chemotherapy and/or immunotherapy agents: Keytruda (pembrolizumab)/Alimta (pemetrexed)/Carboplatin.      To help prevent nausea and vomiting after your treatment, we encourage you to take your nausea medication as directed.  BELOW ARE SYMPTOMS THAT SHOULD BE REPORTED IMMEDIATELY: *FEVER GREATER THAN 100.4 F (38 C) OR HIGHER *CHILLS OR SWEATING *NAUSEA AND VOMITING THAT IS NOT CONTROLLED WITH YOUR NAUSEA MEDICATION *UNUSUAL SHORTNESS OF BREATH *UNUSUAL BRUISING OR BLEEDING *URINARY PROBLEMS (pain or burning when urinating, or frequent urination) *BOWEL PROBLEMS (unusual diarrhea, constipation, pain near the anus) TENDERNESS IN MOUTH AND THROAT WITH OR WITHOUT PRESENCE OF ULCERS (sore throat, sores in mouth, or a toothache) UNUSUAL RASH, SWELLING OR PAIN  UNUSUAL VAGINAL DISCHARGE OR ITCHING   Items with * indicate a potential emergency and should be followed up as soon as possible or go to the Emergency Department if any problems should occur.  Please show the CHEMOTHERAPY ALERT  CARD or IMMUNOTHERAPY ALERT CARD at check-in to the Emergency Department and triage nurse.  Should you have questions after your visit or need to cancel or reschedule your appointment, please contact [facility]  Dept: [phone]  and follow the prompts.  Office hours are 8:00 a.m. to 4:30 p.m. Monday - Friday. Please note that voicemails left after 4:00 p.m. may not be returned until the following business day.  We are closed weekends and major holidays. You have access to a nurse at all times for urgent questions. Please call the main number to the clinic Dept: [phone] and follow the prompts.   For any non-urgent questions, you may also contact your provider using MyChart. We now offer e-Visits for anyone 85 and older to request care online for non-urgent symptoms. For details visit [email or web address].   Also download the MyChart app! Go to the app store, search "MyChart", open the app, select [facility], and log in with your MyChart username and password.  Due to Covid, a mask is required upon entering the hospital/clinic. If you do not have a mask, one will be given to you upon arrival. For doctor visits, patients may have 1 support person aged 20 or older with them. For treatment visits, patients cannot have anyone with them due to current Covid guidelines and our immunocompromised population.   Pembrolizumab injection What is this medication? PEMBROLIZUMAB (pem broe liz ue mab) is a monoclonal antibody. It is used to treat certain types of cancer. This medicine may be used for other purposes; ask your health care provider or pharmacist if you have questions. COMMON BRAND NAME(S):  Keytruda What should I tell my care team before I take this medication? They need to know if you have any of these conditions: autoimmune diseases like Crohn's disease, ulcerative colitis, or lupus have had or planning to have an allogeneic stem cell transplant (uses  someone else's stem cells) history of organ transplant history of chest radiation nervous system problems like myasthenia gravis or Guillain-Barre syndrome an unusual or allergic reaction to pembrolizumab, other medicines, foods, dyes, or preservatives pregnant or trying to get pregnant breast-feeding How should I use this medication? This medicine is for infusion into a vein. It is given by a health care professional in a hospital or clinic setting. A special MedGuide will be given to you before each treatment. Be sure to read this information carefully each time. Talk to your pediatrician regarding the use of this medicine in children. While this drug may be prescribed for children as young as 6 months for selected conditions, precautions do apply. Overdosage: If you think you have taken too much of this medicine contact a poison control center or emergency room at once. NOTE: This medicine is only for you. Do not share this medicine with others. What if I miss a dose? It is important not to miss your dose. Call your doctor or health care professional if you are unable to keep an appointment. What may interact with this medication? Interactions have not been studied. This list may not describe all possible interactions. Give your health care provider a list of all the medicines, herbs, non-prescription drugs, or dietary supplements you use. Also tell them if you smoke, drink alcohol, or use illegal drugs. Some items may interact with your medicine. What should I watch for while using this medication? Your condition will be monitored carefully while you are receiving this medicine. You may need blood work done while you are taking this medicine. Do not become pregnant while taking this medicine or for 4 months after stopping it. Women should inform their doctor if they wish to become pregnant or think they might be pregnant. There is a potential for serious side effects to an unborn child. Talk  to your health care professional or pharmacist for more information. Do not breast-feed an infant while taking this medicine or for 4 months after the last dose. What side effects may I notice from receiving this medication? Side effects that you should report to your doctor or health care professional as soon as possible: allergic reactions like skin rash, itching or hives, swelling of the face, lips, or tongue bloody or black, tarry breathing problems changes in vision chest pain chills confusion constipation cough diarrhea dizziness or feeling faint or lightheaded fast or irregular heartbeat fever flushing joint pain low blood counts - this medicine may decrease the number of white blood cells, red blood cells and platelets. You may be at increased risk for infections and bleeding. muscle pain muscle weakness pain, tingling, numbness in the hands or feet persistent headache redness, blistering, peeling or loosening of the skin, including inside the mouth signs and symptoms of high blood sugar such as dizziness; dry mouth; dry skin; fruity breath; nausea; stomach pain; increased hunger or thirst; increased urination signs and symptoms of kidney injury like trouble passing urine or change in the amount of urine signs and symptoms of liver injury like dark urine, light-colored stools, loss of appetite, nausea, right upper belly pain, yellowing of the eyes or skin sweating swollen lymph nodes weight loss Side effects that usually do not  require medical attention (report to your doctor or health care professional if they continue or are bothersome): decreased appetite hair loss tiredness This list may not describe all possible side effects. Call your doctor for medical advice about side effects. You may report side effects to FDA at 1-800-FDA-1088. Where should I keep my medication? This drug is given in a hospital or clinic and will not be stored at home. NOTE: This sheet is a  summary. It may not cover all possible information. If you have questions about this medicine, talk to your doctor, pharmacist, or health care provider.  2022 Elsevier/Gold Standard (2021-02-04 00:00:00)  Pemetrexed injection What is this medication? PEMETREXED (PEM e TREX ed) is a chemotherapy drug used to treat lung cancers like non-small cell lung cancer and mesothelioma. It may also be used to treat other cancers. This medicine may be used for other purposes; ask your health care provider or pharmacist if you have questions. COMMON BRAND NAME(S): Alimta, PEMFEXY What should I tell my care team before I take this medication? They need to know if you have any of these conditions: infection (especially a virus infection such as chickenpox, cold sores, or herpes) kidney disease low blood counts, like low white cell, platelet, or red cell counts lung or breathing disease, like asthma radiation therapy an unusual or allergic reaction to pemetrexed, other medicines, foods, dyes, or preservative pregnant or trying to get pregnant breast-feeding How should I use this medication? This drug is given as an infusion into a vein. It is administered in a hospital or clinic by a specially trained health care professional. Talk to your pediatrician regarding the use of this medicine in children. Special care may be needed. Overdosage: If you think you have taken too much of this medicine contact a poison control center or emergency room at once. NOTE: This medicine is only for you. Do not share this medicine with others. What if I miss a dose? It is important not to miss your dose. Call your doctor or health care professional if you are unable to keep an appointment. What may interact with this medication? This medicine may interact with the following medications: Ibuprofen This list may not describe all possible interactions. Give your health care provider a list of all the medicines, herbs,  non-prescription drugs, or dietary supplements you use. Also tell them if you smoke, drink alcohol, or use illegal drugs. Some items may interact with your medicine. What should I watch for while using this medication? Visit your doctor for checks on your progress. This drug may make you feel generally unwell. This is not uncommon, as chemotherapy can affect healthy cells as well as cancer cells. Report any side effects. Continue your course of treatment even though you feel ill unless your doctor tells you to stop. In some cases, you may be given additional medicines to help with side effects. Follow all directions for their use. Call your doctor or health care professional for advice if you get a fever, chills or sore throat, or other symptoms of a cold or flu. Do not treat yourself. This drug decreases your body's ability to fight infections. Try to avoid being around people who are sick. This medicine may increase your risk to bruise or bleed. Call your doctor or health care professional if you notice any unusual bleeding. Be careful brushing and flossing your teeth or using a toothpick because you may get an infection or bleed more easily. If you have any dental work done,  tell your dentist you are receiving this medicine. Avoid taking products that contain aspirin, acetaminophen, ibuprofen, naproxen, or ketoprofen unless instructed by your doctor. These medicines may hide a fever. Call your doctor or health care professional if you get diarrhea or mouth sores. Do not treat yourself. To protect your kidneys, drink water or other fluids as directed while you are taking this medicine. Do not become pregnant while taking this medicine or for 6 months after stopping it. Women should inform their doctor if they wish to become pregnant or think they might be pregnant. Men should not father a child while taking this medicine and for 3 months after stopping it. This may interfere with the ability to father a  child. You should talk to your doctor or health care professional if you are concerned about your fertility. There is a potential for serious side effects to an unborn child. Talk to your health care professional or pharmacist for more information. Do not breast-feed an infant while taking this medicine or for 1 week after stopping it. What side effects may I notice from receiving this medication? Side effects that you should report to your doctor or health care professional as soon as possible: allergic reactions like skin rash, itching or hives, swelling of the face, lips, or tongue breathing problems redness, blistering, peeling or loosening of the skin, including inside the mouth signs and symptoms of bleeding such as bloody or black, tarry stools; red or dark-brown urine; spitting up blood or brown material that looks like coffee grounds; red spots on the skin; unusual bruising or bleeding from the eye, gums, or nose signs and symptoms of infection like fever or chills; cough; sore throat; pain or trouble passing urine signs and symptoms of kidney injury like trouble passing urine or change in the amount of urine signs and symptoms of liver injury like dark yellow or brown urine; general ill feeling or flu-like symptoms; light-colored stools; loss of appetite; nausea; right upper belly pain; unusually weak or tired; yellowing of the eyes or skin Side effects that usually do not require medical attention (report to your doctor or health care professional if they continue or are bothersome): constipation mouth sores nausea, vomiting unusually weak or tired This list may not describe all possible side effects. Call your doctor for medical advice about side effects. You may report side effects to FDA at 1-800-FDA-1088. Where should I keep my medication? This drug is given in a hospital or clinic and will not be stored at home. NOTE: This sheet is a summary. It may not cover all possible  information. If you have questions about this medicine, talk to your doctor, pharmacist, or health care provider.  2022 Elsevier/Gold Standard (2017-07-13 00:00:00)  Carboplatin injection What is this medication? CARBOPLATIN (KAR boe pla tin) is a chemotherapy drug. It targets fast dividing cells, like cancer cells, and causes these cells to die. This medicine is used to treat ovarian cancer and many other cancers. This medicine may be used for other purposes; ask your health care provider or pharmacist if you have questions. COMMON BRAND NAME(S): Paraplatin What should I tell my care team before I take this medication? They need to know if you have any of these conditions: blood disorders hearing problems kidney disease recent or ongoing radiation therapy an unusual or allergic reaction to carboplatin, cisplatin, other chemotherapy, other medicines, foods, dyes, or preservatives pregnant or trying to get pregnant breast-feeding How should I use this medication? This drug is usually  given as an infusion into a vein. It is administered in a hospital or clinic by a specially trained health care professional. Talk to your pediatrician regarding the use of this medicine in children. Special care may be needed. Overdosage: If you think you have taken too much of this medicine contact a poison control center or emergency room at once. NOTE: This medicine is only for you. Do not share this medicine with others. What if I miss a dose? It is important not to miss a dose. Call your doctor or health care professional if you are unable to keep an appointment. What may interact with this medication? medicines for seizures medicines to increase blood counts like filgrastim, pegfilgrastim, sargramostim some antibiotics like amikacin, gentamicin, neomycin, streptomycin, tobramycin vaccines Talk to your doctor or health care professional before taking any of these  medicines: acetaminophen aspirin ibuprofen ketoprofen naproxen This list may not describe all possible interactions. Give your health care provider a list of all the medicines, herbs, non-prescription drugs, or dietary supplements you use. Also tell them if you smoke, drink alcohol, or use illegal drugs. Some items may interact with your medicine. What should I watch for while using this medication? Your condition will be monitored carefully while you are receiving this medicine. You will need important blood work done while you are taking this medicine. This drug may make you feel generally unwell. This is not uncommon, as chemotherapy can affect healthy cells as well as cancer cells. Report any side effects. Continue your course of treatment even though you feel ill unless your doctor tells you to stop. In some cases, you may be given additional medicines to help with side effects. Follow all directions for their use. Call your doctor or health care professional for advice if you get a fever, chills or sore throat, or other symptoms of a cold or flu. Do not treat yourself. This drug decreases your body's ability to fight infections. Try to avoid being around people who are sick. This medicine may increase your risk to bruise or bleed. Call your doctor or health care professional if you notice any unusual bleeding. Be careful brushing and flossing your teeth or using a toothpick because you may get an infection or bleed more easily. If you have any dental work done, tell your dentist you are receiving this medicine. Avoid taking products that contain aspirin, acetaminophen, ibuprofen, naproxen, or ketoprofen unless instructed by your doctor. These medicines may hide a fever. Do not become pregnant while taking this medicine. Women should inform their doctor if they wish to become pregnant or think they might be pregnant. There is a potential for serious side effects to an unborn child. Talk to your  health care professional or pharmacist for more information. Do not breast-feed an infant while taking this medicine. What side effects may I notice from receiving this medication? Side effects that you should report to your doctor or health care professional as soon as possible: allergic reactions like skin rash, itching or hives, swelling of the face, lips, or tongue signs of infection - fever or chills, cough, sore throat, pain or difficulty passing urine signs of decreased platelets or bleeding - bruising, pinpoint red spots on the skin, black, tarry stools, nosebleeds signs of decreased red blood cells - unusually weak or tired, fainting spells, lightheadedness breathing problems changes in hearing changes in vision chest pain high blood pressure low blood counts - This drug may decrease the number of white blood cells, red blood  cells and platelets. You may be at increased risk for infections and bleeding. nausea and vomiting pain, swelling, redness or irritation at the injection site pain, tingling, numbness in the hands or feet problems with balance, talking, walking trouble passing urine or change in the amount of urine Side effects that usually do not require medical attention (report to your doctor or health care professional if they continue or are bothersome): hair loss loss of appetite metallic taste in the mouth or changes in taste This list may not describe all possible side effects. Call your doctor for medical advice about side effects. You may report side effects to FDA at 1-800-FDA-1088. Where should I keep my medication? This drug is given in a hospital or clinic and will not be stored at home. NOTE: This sheet is a summary. It may not cover all possible information. If you have questions about this medicine, talk to your doctor, pharmacist, or health care provider.  2022 Elsevier/Gold Standard (2007-10-26 00:00:00)

## 2021-04-30 NOTE — Progress Notes (Signed)
Pharmacist Chemotherapy Monitoring - Initial Assessment    Anticipated start date: 04/30/21   The following has been reviewed per standard work regarding the patient's treatment regimen: The patient's diagnosis, treatment plan and drug doses, and organ/hematologic function Lab orders and baseline tests specific to treatment regimen  The treatment plan start date, drug sequencing, and pre-medications Prior authorization status  Patient's documented medication list, including drug-drug interaction screen and prescriptions for anti-emetics and supportive care specific to the treatment regimen The drug concentrations, fluid compatibility, administration routes, and timing of the medications to be used The patient's access for treatment and lifetime cumulative dose history, if applicable  The patient's medication allergies and previous infusion related reactions, if applicable   Changes made to treatment plan:  N/A  Follow up needed:  N/A   Wynona Neat, [facility], 04/30/2021  9:16 AM

## 2021-04-30 NOTE — Progress Notes (Signed)
Met w/ pt regarding the [employer] and he declined the grant at this time.  He has my card in case he changes his mind in the future.

## 2021-05-07 ENCOUNTER — Other Ambulatory Visit: Payer: Medicare Other

## 2021-05-08 ENCOUNTER — Other Ambulatory Visit: Payer: Self-pay

## 2021-05-08 ENCOUNTER — Inpatient Hospital Stay (HOSPITAL_BASED_OUTPATIENT_CLINIC_OR_DEPARTMENT_OTHER): Payer: Medicare Other | Admitting: Internal Medicine

## 2021-05-08 ENCOUNTER — Encounter: Payer: Self-pay | Admitting: Internal Medicine

## 2021-05-08 ENCOUNTER — Other Ambulatory Visit: Payer: Medicare Other

## 2021-05-08 ENCOUNTER — Inpatient Hospital Stay: Payer: Medicare Other | Attending: Physician Assistant

## 2021-05-08 VITALS — BP 108/75 | HR 88 | Temp 98.4°F | Resp 19 | Ht 69.0 in | Wt 168.6 lb

## 2021-05-08 DIAGNOSIS — C782 Secondary malignant neoplasm of pleura: Secondary | ICD-10-CM | POA: Diagnosis not present

## 2021-05-08 DIAGNOSIS — C3412 Malignant neoplasm of upper lobe, left bronchus or lung: Secondary | ICD-10-CM | POA: Insufficient documentation

## 2021-05-08 DIAGNOSIS — C3491 Malignant neoplasm of unspecified part of right bronchus or lung: Secondary | ICD-10-CM

## 2021-05-08 DIAGNOSIS — Z5112 Encounter for antineoplastic immunotherapy: Secondary | ICD-10-CM | POA: Insufficient documentation

## 2021-05-08 DIAGNOSIS — R5383 Other fatigue: Secondary | ICD-10-CM | POA: Diagnosis not present

## 2021-05-08 DIAGNOSIS — Z8546 Personal history of malignant neoplasm of prostate: Secondary | ICD-10-CM | POA: Diagnosis not present

## 2021-05-08 DIAGNOSIS — Z7901 Long term (current) use of anticoagulants: Secondary | ICD-10-CM | POA: Diagnosis not present

## 2021-05-08 DIAGNOSIS — I4891 Unspecified atrial fibrillation: Secondary | ICD-10-CM | POA: Insufficient documentation

## 2021-05-08 DIAGNOSIS — C3490 Malignant neoplasm of unspecified part of unspecified bronchus or lung: Secondary | ICD-10-CM

## 2021-05-08 DIAGNOSIS — Z79899 Other long term (current) drug therapy: Secondary | ICD-10-CM | POA: Insufficient documentation

## 2021-05-08 DIAGNOSIS — Z923 Personal history of irradiation: Secondary | ICD-10-CM | POA: Diagnosis not present

## 2021-05-08 DIAGNOSIS — Z5111 Encounter for antineoplastic chemotherapy: Secondary | ICD-10-CM

## 2021-05-08 LAB — CMP (CANCER CENTER ONLY)
ALT: 17 U/L (ref 0–44)
AST: 22 U/L (ref 15–41)
Albumin: 3.9 g/dL (ref 3.5–5.0)
Alkaline Phosphatase: 104 U/L (ref 38–126)
Anion gap: 7 (ref 5–15)
BUN: 18 mg/dL (ref 8–23)
CO2: 23 mmol/L (ref 22–32)
Calcium: 8.8 mg/dL — ABNORMAL LOW (ref 8.9–10.3)
Chloride: 105 mmol/L (ref 98–111)
Creatinine: 0.85 mg/dL (ref 0.61–1.24)
GFR, Estimated: >60 mL/min (ref >60)
Glucose, Bld: 148 mg/dL — ABNORMAL HIGH (ref 70–99)
Potassium: 4.2 mmol/L (ref 3.5–5.1)
Sodium: 135 mmol/L (ref 135–145)
Total Bilirubin: 0.8 mg/dL (ref 0.3–1.2)
Total Protein: 7.4 g/dL (ref 6.5–8.1)

## 2021-05-08 LAB — CBC WITH DIFFERENTIAL (CANCER CENTER ONLY)
Abs Immature Granulocytes: 0.01 10*3/uL (ref 0.00–0.07)
Basophils Absolute: 0 10*3/uL (ref 0.0–0.1)
Basophils Relative: 1 %
Eosinophils Absolute: 0.3 10*3/uL (ref 0.0–0.5)
Eosinophils Relative: 6 %
HCT: 36.9 % — ABNORMAL LOW (ref 39.0–52.0)
Hemoglobin: 11.9 g/dL — ABNORMAL LOW (ref 13.0–17.0)
Immature Granulocytes: 0 %
Lymphocytes Relative: 34 %
Lymphs Abs: 1.4 10*3/uL (ref 0.7–4.0)
MCH: 28.2 pg (ref 26.0–34.0)
MCHC: 32.2 g/dL (ref 30.0–36.0)
MCV: 87.4 fL (ref 80.0–100.0)
Monocytes Absolute: 0.6 10*3/uL (ref 0.1–1.0)
Monocytes Relative: 13 %
Neutro Abs: 1.9 10*3/uL (ref 1.7–7.7)
Neutrophils Relative %: 46 %
Platelet Count: 67 10*3/uL — ABNORMAL LOW (ref 150–400)
RBC: 4.22 MIL/uL (ref 4.22–5.81)
RDW: 13.9 % (ref 11.5–15.5)
Smear Review: NORMAL
WBC Count: 4.2 10*3/uL (ref 4.0–10.5)
nRBC: 0 % (ref 0.0–0.2)

## 2021-05-08 LAB — GUARDANT 360

## 2021-05-08 NOTE — Progress Notes (Signed)
[name] [phone]   [phone]  OFFICE PROGRESS NOTE  [name], MD [address] [place] [address]  DIAGNOSIS: Recurrent/metastatic non-small cell lung cancer, adenocarcinoma initially diagnosed as a stage Ib (T2 a, N0, M0) adenocarcinoma in September 2020 status post wedge resection of the left upper lobe nodule.  The patient has disease recurrence and September 2022 with metastatic mediastinal lymphadenopathy as well as left hilar and left pleural metastatic disease   Molecular Studies: Insufficient genetic material on guardant 360 and for foundation 1  PD-L1 expression was negative.  PRIOR THERAPY: None   CURRENT THERAPY: Systemic chemotherapy with carboplatin for AUC of 5, Alimta 500 Mg/M2 and Keytruda 200 Mg IV every 3 weeks.  First dose April 30, 2021.  Status post 1 cycle.  INTERVAL HISTORY: Vincent Velez 80 y.o. male returns to the clinic today for follow-up visit accompanied by his grandson.  The patient is feeling fine today with no concerning complaints he tolerated the first week of his treatment fairly well with no significant adverse effect except for mild change of taste.  He denied having any chest pain, shortness of breath, cough or hemoptysis.  He denied having any fever or chills.  He has no nausea, vomiting, diarrhea or constipation.  He has no headache or visual changes.  He is here today for evaluation and repeat blood work.  MEDICAL HISTORY: Past Medical History:  Diagnosis Date   AAA (abdominal aortic aneurysm)    Anxiety    Arthritis    Asthma    when younger   Atrial fibrillation (HCC)    CAD (coronary artery disease)    Cataract    removed both   COPD (chronic obstructive pulmonary disease) (HCC)    Diabetes mellitus    Type II   Dysrhythmia    afib   Esophageal reflux    Family history of malignant neoplasm of gastrointestinal tract    Heart murmur    Hiatal hernia     Hyperlipemia    Hypertension    Lung nodule    a. PET scan highly suspicious for malignancy. Bronch with biopsy to be done after TAVR   Malignant neoplasm of prostate [facility])    prostate    Peripheral vascular disease ([facility])    Pneumonia    Stricture and stenosis of esophagus     ALLERGIES:  is allergic to macrolides and ketolides.  MEDICATIONS:  Current Outpatient Medications  Medication Sig Dispense Refill   ACCU-CHEK GUIDE test strip USE TO CHECK YOUR BLOOD SUGAR ONCE DAILY     acetaminophen (TYLENOL) 500 MG tablet Take 2 tablets (1,000 mg total) by mouth every 6 (six) hours as needed for mild pain or fever. 30 tablet 0   diltiazem (CARDIZEM SR) 60 MG 12 hr capsule Take 1 capsule (60 mg total) by mouth every 12 (twelve) hours. 60 capsule 7   dorzolamide-timolol (COSOPT) 22.3-6.8 MG/ML ophthalmic solution Place 1 drop into both eyes in the morning, at noon, and at bedtime.     folic acid (FOLVITE) 1 MG tablet Take 1 tablet (1 mg total) by mouth daily. 30 tablet 4   glimepiride (AMARYL) 2 MG tablet Take 2 mg by mouth daily with breakfast.     JARDIANCE 25 MG TABS tablet Take 25 mg by mouth daily.     metFORMIN (GLUCOPHAGE-XR) 500 MG 24 hr tablet Take 1,000 mg by mouth every evening.  metoprolol succinate (TOPROL-XL) 50 MG 24 hr tablet Take 50 mg by mouth daily.     Multiple Vitamins-Minerals (MULTIVITAMIN WITH MINERALS) tablet Take 1 tablet by mouth daily.     Multiple Vitamins-Minerals (ZINC PO) Take 1 tablet by mouth daily.     pantoprazole (PROTONIX) 40 MG tablet Take 40 mg by mouth daily.     prochlorperazine (COMPAZINE) 10 MG tablet Take 1 tablet (10 mg total) by mouth every 6 (six) hours as needed for nausea or vomiting. 30 tablet 0   simvastatin (ZOCOR) 40 MG tablet Take 40 mg by mouth at bedtime.     XARELTO 20 MG TABS tablet TAKE 1 TABLET BY MOUTH  DAILY WITH SUPPER 90 tablet 1   No current facility-administered medications for this visit.    SURGICAL HISTORY:  Past  Surgical History:  Procedure Laterality Date   ABDOMINAL AORTA STENT     CARDIAC CATHETERIZATION     CARDIOVERSION N/A 12/04/2015   Procedure: CARDIOVERSION;  Surgeon: Sanda Klein, MD;  Location: [facility];  Service: Cardiovascular;  Laterality: N/A;   COLONOSCOPY     FINGER SURGERY Right    middle finger- amputation   INSERTION PROSTATE RADIATION SEED     MEDIASTINOSCOPY N/A 01/06/2019   Procedure: MEDIASTINOSCOPY WITH BIOPIES;  Surgeon: Gaye Pollack, MD;  Location: [facility];  Service: Thoracic;  Laterality: N/A;   RIGHT/LEFT HEART CATH AND CORONARY ANGIOGRAPHY N/A 10/26/2018   Procedure: RIGHT/LEFT HEART CATH AND CORONARY ANGIOGRAPHY;  Surgeon: Burnell Blanks, MD;  Location: [facility];  Service: Cardiovascular;  Laterality: N/A;   TEE WITHOUT CARDIOVERSION N/A 12/06/2018   Procedure: TRANSESOPHAGEAL ECHOCARDIOGRAM (TEE);  Surgeon: Burnell Blanks, MD;  Location: [facility];  Service: Open Heart Surgery;  Laterality: N/A;   TRANSCATHETER AORTIC VALVE REPLACEMENT, TRANSFEMORAL N/A 12/06/2018   Procedure: TRANSCATHETER AORTIC VALVE REPLACEMENT, TRANSFEMORAL;  Surgeon: Burnell Blanks, MD;  Location: [facility];  Service: Open Heart Surgery;  Laterality: N/A;   UPPER GASTROINTESTINAL ENDOSCOPY     VIDEO ASSISTED THORACOSCOPY (VATS)/THOROCOTOMY Left 02/02/2019   Procedure: VIDEO ASSISTED THORACOSCOPY (VATS)/THOROCOTOMY;  Surgeon: Gaye Pollack, MD;  Location: [facility];  Service: Thoracic;  Laterality: Left;   VIDEO BRONCHOSCOPY N/A 01/06/2019   Procedure: VIDEO BRONCHOSCOPY;  Surgeon: Gaye Pollack, MD;  Location: [facility];  Service: Thoracic;  Laterality: N/A;   VIDEO BRONCHOSCOPY WITH ENDOBRONCHIAL ULTRASOUND N/A 03/07/2021   Procedure: VIDEO BRONCHOSCOPY WITH ENDOBRONCHIAL ULTRASOUND;  Surgeon: [name], MD;  Location: [facility];  Service: Thoracic;  Laterality: N/A;   WEDGE RESECTION Left 02/02/2019   Procedure: LUNG RESECTION;  Surgeon:  Gaye Pollack, MD;  Location: [facility];  Service: Thoracic;  Laterality: Left;    REVIEW OF SYSTEMS:  A comprehensive review of systems was negative except for: Constitutional: positive for fatigue   PHYSICAL EXAMINATION: General appearance: alert, cooperative, fatigued, and no distress Head: Normocephalic, without obvious abnormality, atraumatic Neck: no adenopathy, no JVD, supple, symmetrical, trachea midline, and thyroid not enlarged, symmetric, no tenderness/mass/nodules Lymph nodes: Cervical, supraclavicular, and axillary nodes normal. Resp: clear to auscultation bilaterally Back: symmetric, no curvature. ROM normal. No CVA tenderness. Cardio: regular rate and rhythm, S1, S2 normal, no murmur, click, rub or gallop GI: soft, non-tender; bowel sounds normal; no masses,  no organomegaly Extremities: extremities normal, atraumatic, no cyanosis or edema  ECOG PERFORMANCE STATUS: 1 - Symptomatic but completely ambulatory  Blood pressure 108/75, pulse 88, temperature 98.4 F (36.9 C), temperature source  Tympanic, resp. rate 19, height '5\' 9"'  (1.753 m), weight 168 lb 9.6 oz (76.5 kg), SpO2 100 %.  LABORATORY DATA: Lab Results  Component Value Date   WBC 4.2 05/08/2021   HGB 11.9 (L) 05/08/2021   HCT 36.9 (L) 05/08/2021   MCV 87.4 05/08/2021   PLT 67 (L) 05/08/2021      Chemistry      Component Value Date/Time   NA 135 05/08/2021 0940   NA 138 12/06/2019 1438   K 4.2 05/08/2021 0940   CL 105 05/08/2021 0940   CO2 23 05/08/2021 0940   BUN 18 05/08/2021 0940   BUN 14 12/06/2019 1438   CREATININE 0.85 05/08/2021 0940   CREATININE 0.81 11/29/2015 1335      Component Value Date/Time   CALCIUM 8.8 (L) 05/08/2021 0940   ALKPHOS 104 05/08/2021 0940   AST 22 05/08/2021 0940   ALT 17 05/08/2021 0940   BILITOT 0.8 05/08/2021 0940       RADIOGRAPHIC STUDIES: No results found.  ASSESSMENT AND PLAN:  This is a very pleasant 81 years old white male recently diagnosed with  recurrent/metastatic non-small cell lung cancer, adenocarcinoma that was initially diagnosed as stage Ib (T2 a, N0, M0) adenocarcinoma in September 2020 status post wedge resection of the left lower lobe lung nodule and he has recurrence in September 2022 presented with metastatic mediastinal lymphadenopathy in addition to left hilar and left pleural metastatic disease. The patient had molecular studies by foundation 1 but there was insufficient material for the molecular test.  His PD-L1 expression was negative. I recommended for the patient to have molecular studies by Guardant 360 by the blood test. The patient started systemic chemotherapy with carboplatin for AUC of 5, Alimta 500 Mg/M2 and Keytruda 200 Mg IV every 3 weeks on April 30, 2021. He tolerated the first week of his treatment fairly well with no concerning adverse effects. I recommended for him to continue his treatment with the same regimen and he is expected to start cycle #2 on May 22, 2021. The patient was advised to call immediately if he has any concerning symptoms in the interval. The patient voices understanding of current disease status and treatment options and is in agreement with the current care plan.  All questions were answered. The patient knows to call the clinic with any problems, questions or concerns. We can certainly see the patient much sooner if necessary.  Disclaimer: This note was dictated with voice recognition software. Similar sounding words can inadvertently be transcribed and may not be corrected upon review.

## 2021-05-11 ENCOUNTER — Ambulatory Visit (HOSPITAL_COMMUNITY)
Admission: RE | Admit: 2021-05-11 | Discharge: 2021-05-11 | Disposition: A | Discharge status: Home / Self Care | Payer: Medicare Other | Source: Ambulatory Visit | Attending: Physician Assistant | Admitting: Physician Assistant

## 2021-05-11 DIAGNOSIS — C3490 Malignant neoplasm of unspecified part of unspecified bronchus or lung: Secondary | ICD-10-CM | POA: Insufficient documentation

## 2021-05-11 DIAGNOSIS — G319 Degenerative disease of nervous system, unspecified: Secondary | ICD-10-CM | POA: Diagnosis not present

## 2021-05-11 IMAGING — MR MR HEAD WO/W CM
15 series · 48 of 48 positions shown · IV contrast (gadavist)
Comparison: MRI [DATE].

CLINICAL DATA: Metastatic disease evaluation Follow up from
indeterminante finding from initial brain MRI staging for recurrent
lung cancer

EXAM:
MRI HEAD WITHOUT AND WITH CONTRAST
TECHNIQUE: Multiplanar, multiecho pulse sequences of the brain and surrounding
structures were obtained without and with intravenous contrast.
CONTRAST:  8mL GADAVIST GADOBUTROL 1 MMOL/ML IV SOLN

[Series 5: DWI · axial · 3.0mm · 1.36mm/px · z∈[-40,+112]mm · 5 of 104 slices shown (1 of 2)]
[im 1/104]
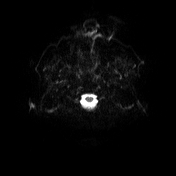
[im 26/104]
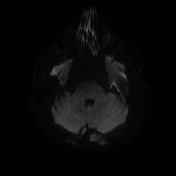
[im 52/104]
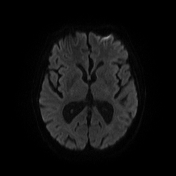
[im 78/104]
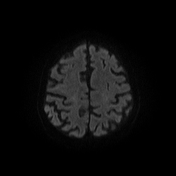
[im 104/104]
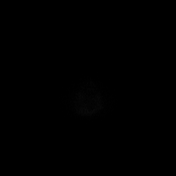

[Series 6: DWI · axial · 3.0mm · 1.36mm/px · z∈[-40,+112]mm · 2 of 52 slices shown (2 of 2)]
[im 1/52]
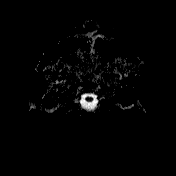
[im 52/52]
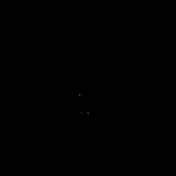

[Series 7: T1 · sagittal · 5.0mm · 0.75mm/px · 1 of 24 slices shown (1 of 4)]
[im 1/24]
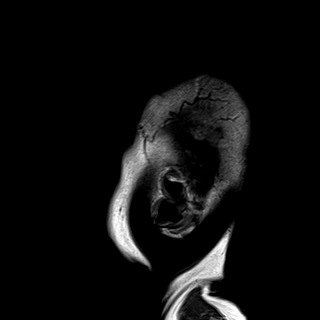

[Series 8: T2 · axial · 5.0mm · 0.62mm/px · 1 of 26 slices shown]
[im 1/26]
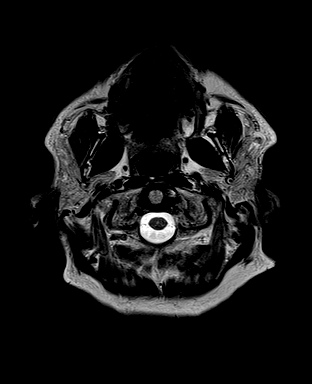

[Series 9: swi_images · axial · 3.0mm · 0.75mm/px · z∈[-71,+140]mm · 4 of 72 slices shown]
[im 1/72]
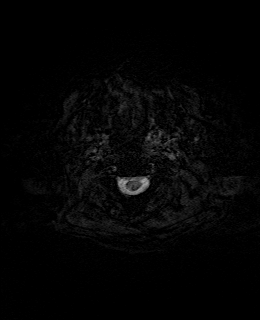
[im 24/72]
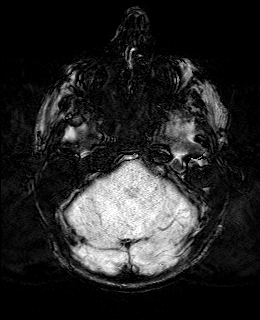
[im 48/72]
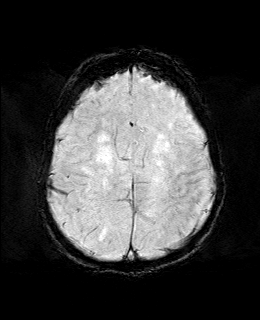
[im 72/72]
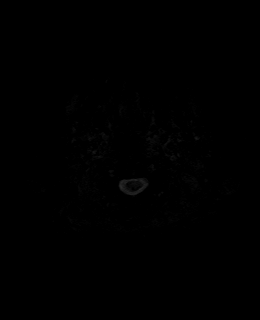

[Series 11: FLAIR · axial · 3.0mm · 0.75mm/px · z∈[-41,+110]mm · 3 of 52 slices shown]
[im 1/52]
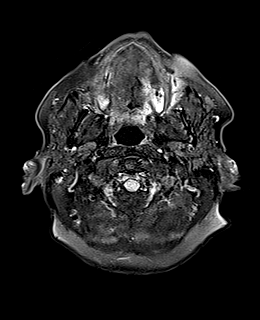
[im 26/52]
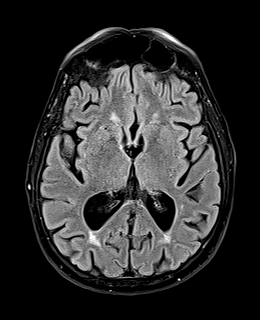
[im 52/52]
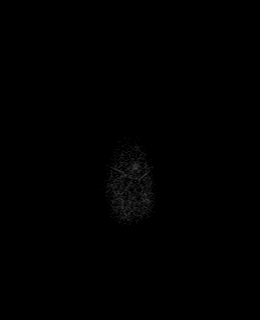

[Series 12: T1 · axial · 1.0mm · 0.94mm/px · z∈[-44,+113]mm · 9 of 160 slices shown (2 of 4)]
[im 1/160]
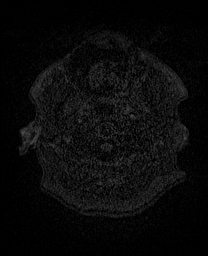
[im 20/160]
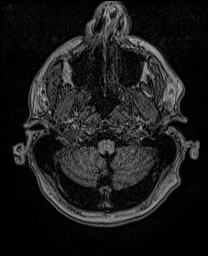
[im 40/160]
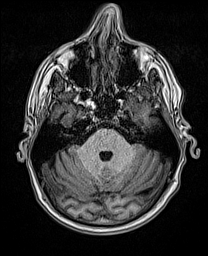
[im 60/160]
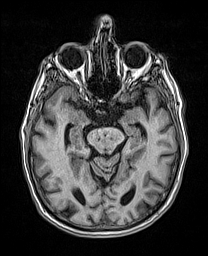
[im 80/160]
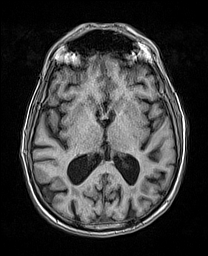
[im 100/160]
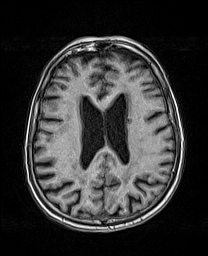
[im 120/160]
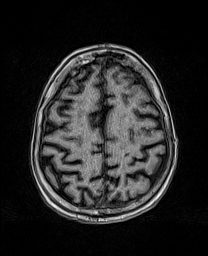
[im 140/160]
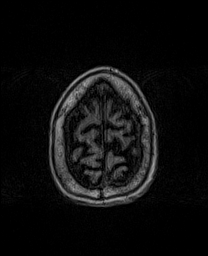
[im 160/160]
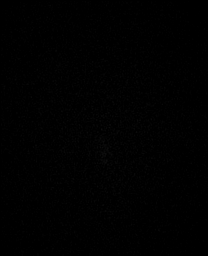

[Series 13: cor dwi_tracew · coronal · 5.0mm · 1.53mm/px · 3 of 60 slices shown]
[im 1/60]
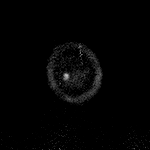
[im 30/60]
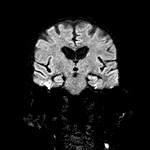
[im 60/60]
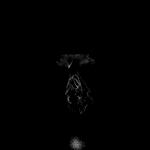

[Series 14: cor dwi_adc · coronal · 5.0mm · 1.53mm/px · 2 of 30 slices shown]
[im 1/30]
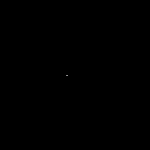
[im 30/30]
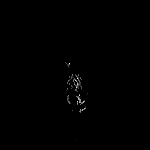

[Series 15: T2 post-contrast · coronal · 5.0mm · 0.57mm/px · 2 of 30 slices shown]
[im 1/30]
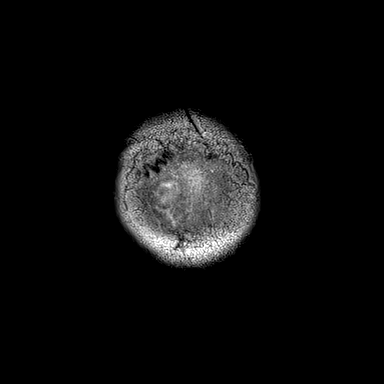
[im 30/30]
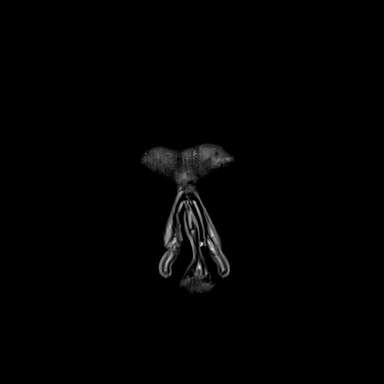

[Series 16: T1 post-contrast · axial · 1.0mm · 0.94mm/px · z∈[-44,+113]mm · 9 of 160 slices shown (1 of 3)]
[im 1/160]
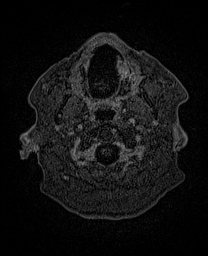
[im 20/160]
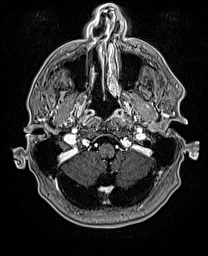
[im 40/160]
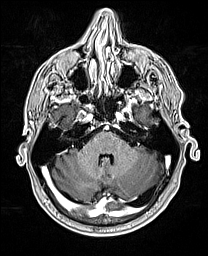
[im 60/160]
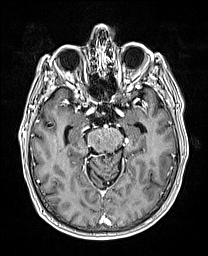
[im 80/160]
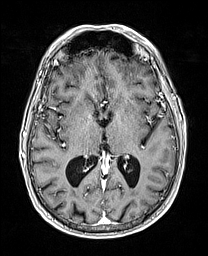
[im 100/160]
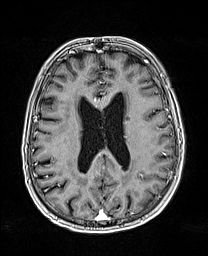
[im 120/160]
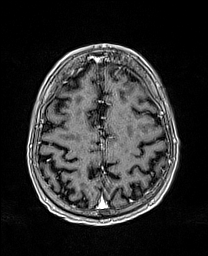
[im 140/160]
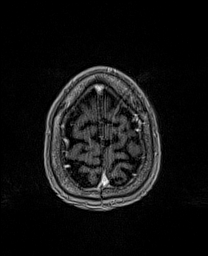
[im 160/160]
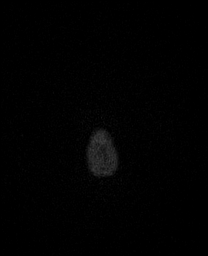

[Series 17: T1 · sagittal · 4.0mm · 0.94mm/px · 2 of 30 slices shown (3 of 4)]
[im 1/30]
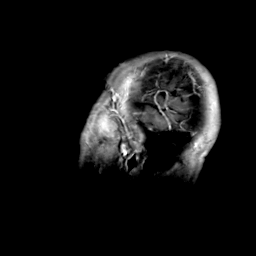
[im 30/30]
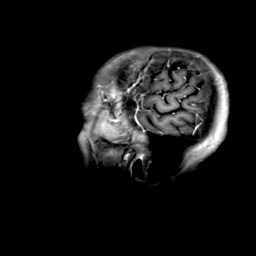

[Series 18: T1 · coronal · 4.0mm · 0.94mm/px · 2 of 30 slices shown (4 of 4)]
[im 1/30]
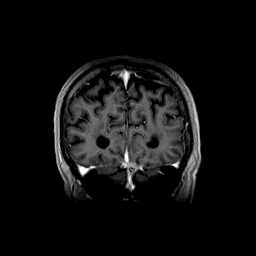
[im 30/30]
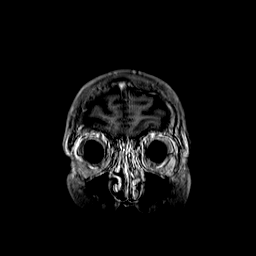

[Series 19: T1 post-contrast · coronal · 5.0mm · 0.43mm/px · 2 of 30 slices shown (2 of 3)]
[im 1/30]
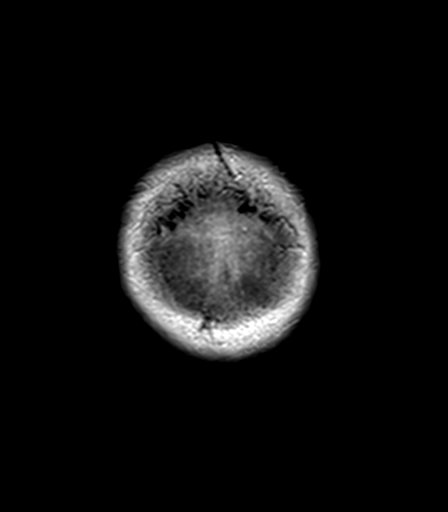
[im 30/30]
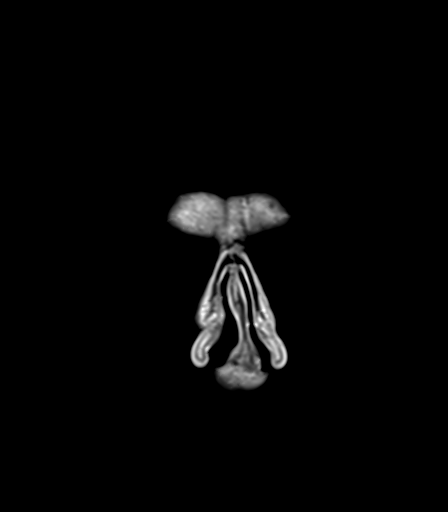

[Series 20: T1 post-contrast · sagittal · 5.0mm · 0.75mm/px · 1 of 24 slices shown (3 of 3)]
[im 1/24]
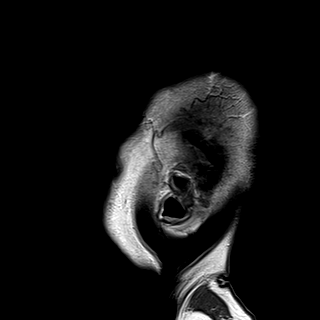

[48 of 48 positions shown; findings below may reference images not displayed]

FINDINGS: Brain: Interval development small area of T2 hyperintensity in the
left periventricular frontal lobe at the site of previously seen
restricted diffusion/enhancement, compatible with development of
encephalomalacia. No masslike enhancement at the site and no
restricted diffusion. Minimal curvilinear enhancement this region is
likely vascular. No abnormal enhancement elsewhere to suggest
metastatic disease. Additional small remote deep white matter
infarcts. Mild to moderate scattered T2/FLAIR hyperintensities in
the white matter, nonspecific but compatible with chronic
microvascular ischemic disease. Mild for age atrophy with ex vacuo
ventricular dilation. Similar chronic mildly prominent retro
cerebellar CSF. No evidence of acute infarct, hydrocephalus, mass
lesion, midline shift, acute hemorrhage, or extra-axial fluid
collection.

Vascular: Major arterial flow voids are maintained at the skull
base.

Skull and upper cervical spine: Normal marrow signal.

Sinuses/Orbits: Mild paranasal sinus mucosal thickening without
air-fluid levels. Unremarkable orbits.

Other: No mastoid effusions.
IMPRESSION: 1. Interval development of encephalomalacia at the site of
previously seen enhancement/restricted diffusion with resolution of
the previously seen enhancement. Findings are compatible with
expected evolution of a small infarct.
2. No evidence of metastatic disease or acute abnormality.

## 2021-05-11 MED ORDER — GADOBUTROL 1 MMOL/ML IV SOLN
8.0000 mL | Freq: Once | INTRAVENOUS | Status: AC | PRN
  Administered 2021-05-11: 8 mL via INTRAVENOUS

## 2021-05-12 ENCOUNTER — Other Ambulatory Visit: Payer: Self-pay | Admitting: Physician Assistant

## 2021-05-12 ENCOUNTER — Telehealth: Payer: Self-pay | Admitting: Physician Assistant

## 2021-05-12 NOTE — Telephone Encounter (Signed)
I called the patient to review the results of his brain MRI.  The indeterminate area on his staging scan is most consistent with a small infarct on follow-up imaging.  Advised the patient that no further imaging or neck steps are required at this time.  Reviewed lifestyle modifications to reduce the risk of stroke including healthy diet, exercise, smoking cessation, managing hyperlipidemia/hypertension, etc.  The patient is currently taking Xarelto.  He expressed understanding and we will see him as scheduled on 05/22/2021.

## 2021-05-14 ENCOUNTER — Other Ambulatory Visit: Payer: Medicare Other

## 2021-05-15 ENCOUNTER — Inpatient Hospital Stay: Payer: Medicare Other

## 2021-05-15 ENCOUNTER — Other Ambulatory Visit: Payer: Self-pay

## 2021-05-15 DIAGNOSIS — I4891 Unspecified atrial fibrillation: Secondary | ICD-10-CM | POA: Diagnosis not present

## 2021-05-15 DIAGNOSIS — Z5112 Encounter for antineoplastic immunotherapy: Secondary | ICD-10-CM | POA: Diagnosis not present

## 2021-05-15 DIAGNOSIS — R5383 Other fatigue: Secondary | ICD-10-CM

## 2021-05-15 DIAGNOSIS — C3490 Malignant neoplasm of unspecified part of unspecified bronchus or lung: Secondary | ICD-10-CM

## 2021-05-15 DIAGNOSIS — C782 Secondary malignant neoplasm of pleura: Secondary | ICD-10-CM | POA: Diagnosis not present

## 2021-05-15 DIAGNOSIS — Z5111 Encounter for antineoplastic chemotherapy: Secondary | ICD-10-CM | POA: Diagnosis not present

## 2021-05-15 DIAGNOSIS — C3412 Malignant neoplasm of upper lobe, left bronchus or lung: Secondary | ICD-10-CM | POA: Diagnosis not present

## 2021-05-15 LAB — CBC WITH DIFFERENTIAL (CANCER CENTER ONLY)
Abs Immature Granulocytes: 0.04 10*3/uL (ref 0.00–0.07)
Basophils Absolute: 0 10*3/uL (ref 0.0–0.1)
Basophils Relative: 0 %
Eosinophils Absolute: 0.3 10*3/uL (ref 0.0–0.5)
Eosinophils Relative: 5 %
HCT: 38.5 % — ABNORMAL LOW (ref 39.0–52.0)
Hemoglobin: 12.4 g/dL — ABNORMAL LOW (ref 13.0–17.0)
Immature Granulocytes: 1 %
Lymphocytes Relative: 25 %
Lymphs Abs: 1.5 10*3/uL (ref 0.7–4.0)
MCH: 28.5 pg (ref 26.0–34.0)
MCHC: 32.2 g/dL (ref 30.0–36.0)
MCV: 88.5 fL (ref 80.0–100.0)
Monocytes Absolute: 0.7 10*3/uL (ref 0.1–1.0)
Monocytes Relative: 11 %
Neutro Abs: 3.5 10*3/uL (ref 1.7–7.7)
Neutrophils Relative %: 58 %
Platelet Count: 124 10*3/uL — ABNORMAL LOW (ref 150–400)
RBC: 4.35 MIL/uL (ref 4.22–5.81)
RDW: 15.1 % (ref 11.5–15.5)
WBC Count: 6.1 10*3/uL (ref 4.0–10.5)
nRBC: 0 % (ref 0.0–0.2)

## 2021-05-15 LAB — CMP (CANCER CENTER ONLY)
ALT: 16 U/L (ref 0–44)
AST: 24 U/L (ref 15–41)
Albumin: 4.1 g/dL (ref 3.5–5.0)
Alkaline Phosphatase: 122 U/L (ref 38–126)
Anion gap: 9 (ref 5–15)
BUN: 18 mg/dL (ref 8–23)
CO2: 23 mmol/L (ref 22–32)
Calcium: 9.5 mg/dL (ref 8.9–10.3)
Chloride: 105 mmol/L (ref 98–111)
Creatinine: 0.98 mg/dL (ref 0.61–1.24)
GFR, Estimated: >60 mL/min (ref >60)
Glucose, Bld: 215 mg/dL — ABNORMAL HIGH (ref 70–99)
Potassium: 4.7 mmol/L (ref 3.5–5.1)
Sodium: 137 mmol/L (ref 135–145)
Total Bilirubin: 0.5 mg/dL (ref 0.3–1.2)
Total Protein: 8 g/dL (ref 6.5–8.1)

## 2021-05-15 LAB — TSH: TSH: 1.296 u[IU]/mL (ref 0.320–4.118)

## 2021-05-17 ENCOUNTER — Other Ambulatory Visit: Payer: Self-pay

## 2021-05-21 ENCOUNTER — Ambulatory Visit: Payer: Medicare Other

## 2021-05-21 ENCOUNTER — Other Ambulatory Visit: Payer: Medicare Other

## 2021-05-21 ENCOUNTER — Ambulatory Visit: Payer: Medicare Other | Admitting: Internal Medicine

## 2021-05-22 ENCOUNTER — Inpatient Hospital Stay (HOSPITAL_BASED_OUTPATIENT_CLINIC_OR_DEPARTMENT_OTHER): Payer: Medicare Other | Admitting: Internal Medicine

## 2021-05-22 ENCOUNTER — Inpatient Hospital Stay: Payer: Medicare Other

## 2021-05-22 ENCOUNTER — Other Ambulatory Visit: Payer: Self-pay

## 2021-05-22 ENCOUNTER — Encounter: Payer: Self-pay | Admitting: Internal Medicine

## 2021-05-22 VITALS — BP 115/84 | HR 88 | Temp 97.6°F | Resp 18 | Ht 69.0 in | Wt 169.1 lb

## 2021-05-22 DIAGNOSIS — C3492 Malignant neoplasm of unspecified part of left bronchus or lung: Secondary | ICD-10-CM

## 2021-05-22 DIAGNOSIS — C3490 Malignant neoplasm of unspecified part of unspecified bronchus or lung: Secondary | ICD-10-CM

## 2021-05-22 DIAGNOSIS — Z5111 Encounter for antineoplastic chemotherapy: Secondary | ICD-10-CM

## 2021-05-22 DIAGNOSIS — I4891 Unspecified atrial fibrillation: Secondary | ICD-10-CM | POA: Diagnosis not present

## 2021-05-22 DIAGNOSIS — Z5112 Encounter for antineoplastic immunotherapy: Secondary | ICD-10-CM

## 2021-05-22 DIAGNOSIS — R5383 Other fatigue: Secondary | ICD-10-CM | POA: Diagnosis not present

## 2021-05-22 DIAGNOSIS — C3412 Malignant neoplasm of upper lobe, left bronchus or lung: Secondary | ICD-10-CM | POA: Diagnosis not present

## 2021-05-22 DIAGNOSIS — C782 Secondary malignant neoplasm of pleura: Secondary | ICD-10-CM | POA: Diagnosis not present

## 2021-05-22 LAB — CBC WITH DIFFERENTIAL (CANCER CENTER ONLY)
Abs Immature Granulocytes: 0.07 10*3/uL (ref 0.00–0.07)
Basophils Absolute: 0.1 10*3/uL (ref 0.0–0.1)
Basophils Relative: 1 %
Eosinophils Absolute: 0.3 10*3/uL (ref 0.0–0.5)
Eosinophils Relative: 5 %
HCT: 36.6 % — ABNORMAL LOW (ref 39.0–52.0)
Hemoglobin: 12.1 g/dL — ABNORMAL LOW (ref 13.0–17.0)
Immature Granulocytes: 1 %
Lymphocytes Relative: 27 %
Lymphs Abs: 1.6 10*3/uL (ref 0.7–4.0)
MCH: 28.9 pg (ref 26.0–34.0)
MCHC: 33.1 g/dL (ref 30.0–36.0)
MCV: 87.4 fL (ref 80.0–100.0)
Monocytes Absolute: 0.9 10*3/uL (ref 0.1–1.0)
Monocytes Relative: 16 %
Neutro Abs: 3 10*3/uL (ref 1.7–7.7)
Neutrophils Relative %: 50 %
Platelet Count: 174 10*3/uL (ref 150–400)
RBC: 4.19 MIL/uL — ABNORMAL LOW (ref 4.22–5.81)
RDW: 15.7 % — ABNORMAL HIGH (ref 11.5–15.5)
WBC Count: 5.9 10*3/uL (ref 4.0–10.5)
nRBC: 0 % (ref 0.0–0.2)

## 2021-05-22 LAB — CMP (CANCER CENTER ONLY)
ALT: 13 U/L (ref 0–44)
AST: 20 U/L (ref 15–41)
Albumin: 4.1 g/dL (ref 3.5–5.0)
Alkaline Phosphatase: 101 U/L (ref 38–126)
Anion gap: 10 (ref 5–15)
BUN: 23 mg/dL (ref 8–23)
CO2: 22 mmol/L (ref 22–32)
Calcium: 9.4 mg/dL (ref 8.9–10.3)
Chloride: 105 mmol/L (ref 98–111)
Creatinine: 0.93 mg/dL (ref 0.61–1.24)
GFR, Estimated: >60 mL/min (ref >60)
Glucose, Bld: 167 mg/dL — ABNORMAL HIGH (ref 70–99)
Potassium: 4.1 mmol/L (ref 3.5–5.1)
Sodium: 137 mmol/L (ref 135–145)
Total Bilirubin: 0.6 mg/dL (ref 0.3–1.2)
Total Protein: 7.4 g/dL (ref 6.5–8.1)

## 2021-05-22 MED ORDER — SODIUM CHLORIDE 0.9 % IV SOLN
200.0000 mg | Freq: Once | INTRAVENOUS | Status: AC
  Administered 2021-05-22: 10:00:00 200 mg via INTRAVENOUS
  Filled 2021-05-22: qty 8

## 2021-05-22 MED ORDER — SODIUM CHLORIDE 0.9 % IV SOLN
500.0000 mg/m2 | Freq: Once | INTRAVENOUS | Status: AC
  Administered 2021-05-22: 11:00:00 1000 mg via INTRAVENOUS
  Filled 2021-05-22: qty 40

## 2021-05-22 MED ORDER — PROCHLORPERAZINE MALEATE 10 MG PO TABS
10.0000 mg | ORAL_TABLET | Freq: Four times a day (QID) | ORAL | 0 refills | Status: DC | PRN
Start: 2021-05-22 — End: 2023-01-13

## 2021-05-22 MED ORDER — SODIUM CHLORIDE 0.9 % IV SOLN
447.0000 mg | Freq: Once | INTRAVENOUS | Status: AC
  Administered 2021-05-22: 11:00:00 450 mg via INTRAVENOUS
  Filled 2021-05-22: qty 45

## 2021-05-22 MED ORDER — SODIUM CHLORIDE 0.9 % IV SOLN: Freq: Once | INTRAVENOUS | Status: AC

## 2021-05-22 MED ORDER — SODIUM CHLORIDE 0.9 % IV SOLN
10.0000 mg | Freq: Once | INTRAVENOUS | Status: AC
  Administered 2021-05-22: 09:00:00 10 mg via INTRAVENOUS
  Filled 2021-05-22: qty 10

## 2021-05-22 MED ORDER — PALONOSETRON HCL INJECTION 0.25 MG/5ML
0.2500 mg | Freq: Once | INTRAVENOUS | Status: AC
  Administered 2021-05-22: 09:00:00 0.25 mg via INTRAVENOUS
  Filled 2021-05-22: qty 5

## 2021-05-22 MED ORDER — FOSAPREPITANT DIMEGLUMINE INJECTION 150 MG
150.0000 mg | Freq: Once | INTRAVENOUS | Status: AC
  Administered 2021-05-22: 09:00:00 150 mg via INTRAVENOUS
  Filled 2021-05-22: qty 150

## 2021-05-22 NOTE — Patient Instructions (Signed)
[facility]  Discharge Instructions: Thank you for choosing [facility] to provide your oncology and hematology care.   If you have a lab appointment with the [facility], please go directly to the [facility] and check in at the registration area.   Wear comfortable clothing and clothing appropriate for easy access to any Portacath or PICC line.   We strive to give you quality time with your provider. You may need to reschedule your appointment if you arrive late (15 or more minutes).  Arriving late affects you and other patients whose appointments are after yours.  Also, if you miss three or more appointments without notifying the office, you may be dismissed from the clinic at the providers discretion.      For prescription refill requests, have your pharmacy contact our office and allow 72 hours for refills to be completed.    Today you received the following chemotherapy and/or immunotherapy agents: Pembrolizumab (Keytruda), Pemetrexed (Alimta), and Carboplatin.   To help prevent nausea and vomiting after your treatment, we encourage you to take your nausea medication as directed.  BELOW ARE SYMPTOMS THAT SHOULD BE REPORTED IMMEDIATELY: *FEVER GREATER THAN 100.4 F (38 C) OR HIGHER *CHILLS OR SWEATING *NAUSEA AND VOMITING THAT IS NOT CONTROLLED WITH YOUR NAUSEA MEDICATION *UNUSUAL SHORTNESS OF BREATH *UNUSUAL BRUISING OR BLEEDING *URINARY PROBLEMS (pain or burning when urinating, or frequent urination) *BOWEL PROBLEMS (unusual diarrhea, constipation, pain near the anus) TENDERNESS IN MOUTH AND THROAT WITH OR WITHOUT PRESENCE OF ULCERS (sore throat, sores in mouth, or a toothache) UNUSUAL RASH, SWELLING OR PAIN  UNUSUAL VAGINAL DISCHARGE OR ITCHING   Items with * indicate a potential emergency and should be followed up as soon as possible or go to the Emergency Department if any problems should occur.  Please show the CHEMOTHERAPY  ALERT CARD or IMMUNOTHERAPY ALERT CARD at check-in to the Emergency Department and triage nurse.  Should you have questions after your visit or need to cancel or reschedule your appointment, please contact [facility]  Dept: [phone]  and follow the prompts.  Office hours are 8:00 a.m. to 4:30 p.m. Monday - Friday. Please note that voicemails left after 4:00 p.m. may not be returned until the following business day.  We are closed weekends and major holidays. You have access to a nurse at all times for urgent questions. Please call the main number to the clinic Dept: [phone] and follow the prompts.   For any non-urgent questions, you may also contact your provider using MyChart. We now offer e-Visits for anyone 85 and older to request care online for non-urgent symptoms. For details visit [email or web address].   Also download the MyChart app! Go to the app store, search "MyChart", open the app, select [facility], and log in with your MyChart username and password.  Due to Covid, a mask is required upon entering the hospital/clinic. If you do not have a mask, one will be given to you upon arrival. For doctor visits, patients may have 1 support person aged 35 or older with them. For treatment visits, patients cannot have anyone with them due to current Covid guidelines and our immunocompromised population.

## 2021-05-22 NOTE — Progress Notes (Signed)
[place] [phone]   [phone]  OFFICE PROGRESS NOTE  [name], MD [address] [place] [address]  DIAGNOSIS: Recurrent/metastatic non-small cell lung cancer, adenocarcinoma initially diagnosed as a stage Ib (T2 a, N0, M0) adenocarcinoma in September 2020 status post wedge resection of the left upper lobe nodule.  The patient has disease recurrence and September 2022 with metastatic mediastinal lymphadenopathy as well as left hilar and left pleural metastatic disease   Molecular Studies: Insufficient genetic material on guardant 360 and for foundation 1  PD-L1 expression was negative.  PRIOR THERAPY: None   CURRENT THERAPY: Systemic chemotherapy with carboplatin for AUC of 5, Alimta 500 Mg/M2 and Keytruda 200 Mg IV every 3 weeks.  First dose April 30, 2021.  Status post 1 cycle.  INTERVAL HISTORY: Vincent Velez 80 y.o. male returns to the clinic today for follow-up visit accompanied by his daughter.  The patient is feeling fine today with no concerning complaints except for fatigue.  He tolerated the first cycle of his systemic chemotherapy fairly well.  He denied having any current chest pain, shortness of breath, cough or hemoptysis.  He denied having any fever or chills.  He has no nausea, vomiting, diarrhea or constipation.  He has no headache or visual changes.  He has no significant weight loss or night sweats.  He is here today for evaluation before starting cycle #2 of his treatment.   MEDICAL HISTORY: Past Medical History:  Diagnosis Date   AAA (abdominal aortic aneurysm)    Anxiety    Arthritis    Asthma    when younger   Atrial fibrillation (HCC)    CAD (coronary artery disease)    Cataract    removed both   COPD (chronic obstructive pulmonary disease) (HCC)    Diabetes mellitus    Type II   Dysrhythmia    afib   Esophageal reflux    Family history of malignant neoplasm of  gastrointestinal tract    Heart murmur    Hiatal hernia    Hyperlipemia    Hypertension    Lung nodule    a. PET scan highly suspicious for malignancy. Bronch with biopsy to be done after TAVR   Malignant neoplasm of prostate [facility])    prostate    Peripheral vascular disease ([place])    Pneumonia    Stricture and stenosis of esophagus     ALLERGIES:  is allergic to macrolides and ketolides.  MEDICATIONS:  Current Outpatient Medications  Medication Sig Dispense Refill   ACCU-CHEK GUIDE test strip USE TO CHECK YOUR BLOOD SUGAR ONCE DAILY     acetaminophen (TYLENOL) 500 MG tablet Take 2 tablets (1,000 mg total) by mouth every 6 (six) hours as needed for mild pain or fever. 30 tablet 0   diltiazem (CARDIZEM SR) 60 MG 12 hr capsule Take 1 capsule (60 mg total) by mouth every 12 (twelve) hours. 60 capsule 7   dorzolamide-timolol (COSOPT) 22.3-6.8 MG/ML ophthalmic solution Place 1 drop into both eyes in the morning, at noon, and at bedtime.     folic acid (FOLVITE) 1 MG tablet Take 1 tablet (1 mg total) by mouth daily. 30 tablet 4   glimepiride (AMARYL) 2 MG tablet Take 2 mg by mouth daily with breakfast.     JARDIANCE 25 MG TABS tablet Take 25 mg by mouth daily.     metFORMIN (GLUCOPHAGE-XR) 500 MG 24 hr tablet Take 1,000  mg by mouth every evening.      metoprolol succinate (TOPROL-XL) 50 MG 24 hr tablet Take 50 mg by mouth daily.     Multiple Vitamins-Minerals (MULTIVITAMIN WITH MINERALS) tablet Take 1 tablet by mouth daily.     Multiple Vitamins-Minerals (ZINC PO) Take 1 tablet by mouth daily.     pantoprazole (PROTONIX) 40 MG tablet Take 40 mg by mouth daily.     prochlorperazine (COMPAZINE) 10 MG tablet Take 1 tablet (10 mg total) by mouth every 6 (six) hours as needed for nausea or vomiting. 30 tablet 0   simvastatin (ZOCOR) 40 MG tablet Take 40 mg by mouth at bedtime.     XARELTO 20 MG TABS tablet TAKE 1 TABLET BY MOUTH  DAILY WITH SUPPER 90 tablet 1   No current facility-administered  medications for this visit.    SURGICAL HISTORY:  Past Surgical History:  Procedure Laterality Date   ABDOMINAL AORTA STENT     CARDIAC CATHETERIZATION     CARDIOVERSION N/A 12/04/2015   Procedure: CARDIOVERSION;  Surgeon: Sanda Klein, MD;  Location: [facility];  Service: Cardiovascular;  Laterality: N/A;   COLONOSCOPY     FINGER SURGERY Right    middle finger- amputation   INSERTION PROSTATE RADIATION SEED     MEDIASTINOSCOPY N/A 01/06/2019   Procedure: MEDIASTINOSCOPY WITH BIOPIES;  Surgeon: Gaye Pollack, MD;  Location: [facility];  Service: Thoracic;  Laterality: N/A;   RIGHT/LEFT HEART CATH AND CORONARY ANGIOGRAPHY N/A 10/26/2018   Procedure: RIGHT/LEFT HEART CATH AND CORONARY ANGIOGRAPHY;  Surgeon: Burnell Blanks, MD;  Location: [place] [facility];  Service: Cardiovascular;  Laterality: N/A;   TEE WITHOUT CARDIOVERSION N/A 12/06/2018   Procedure: TRANSESOPHAGEAL ECHOCARDIOGRAM (TEE);  Surgeon: Burnell Blanks, MD;  Location: [facility];  Service: Open Heart Surgery;  Laterality: N/A;   TRANSCATHETER AORTIC VALVE REPLACEMENT, TRANSFEMORAL N/A 12/06/2018   Procedure: TRANSCATHETER AORTIC VALVE REPLACEMENT, TRANSFEMORAL;  Surgeon: Burnell Blanks, MD;  Location: [facility];  Service: Open Heart Surgery;  Laterality: N/A;   UPPER GASTROINTESTINAL ENDOSCOPY     VIDEO ASSISTED THORACOSCOPY (VATS)/THOROCOTOMY Left 02/02/2019   Procedure: VIDEO ASSISTED THORACOSCOPY (VATS)/THOROCOTOMY;  Surgeon: Gaye Pollack, MD;  Location: [facility];  Service: Thoracic;  Laterality: Left;   VIDEO BRONCHOSCOPY N/A 01/06/2019   Procedure: VIDEO BRONCHOSCOPY;  Surgeon: Gaye Pollack, MD;  Location: [facility];  Service: Thoracic;  Laterality: N/A;   VIDEO BRONCHOSCOPY WITH ENDOBRONCHIAL ULTRASOUND N/A 03/07/2021   Procedure: VIDEO BRONCHOSCOPY WITH ENDOBRONCHIAL ULTRASOUND;  Surgeon: [name], MD;  Location: [facility];  Service: Thoracic;  Laterality: N/A;   WEDGE RESECTION  Left 02/02/2019   Procedure: LUNG RESECTION;  Surgeon: Gaye Pollack, MD;  Location: [facility];  Service: Thoracic;  Laterality: Left;    REVIEW OF SYSTEMS:  A comprehensive review of systems was negative except for: Constitutional: positive for fatigue   PHYSICAL EXAMINATION: General appearance: alert, cooperative, fatigued, and no distress Head: Normocephalic, without obvious abnormality, atraumatic Neck: no adenopathy, no JVD, supple, symmetrical, trachea midline, and thyroid not enlarged, symmetric, no tenderness/mass/nodules Lymph nodes: Cervical, supraclavicular, and axillary nodes normal. Resp: clear to auscultation bilaterally Back: symmetric, no curvature. ROM normal. No CVA tenderness. Cardio: regular rate and rhythm, S1, S2 normal, no murmur, click, rub or gallop GI: soft, non-tender; bowel sounds normal; no masses,  no organomegaly Extremities: extremities normal, atraumatic, no cyanosis or edema  ECOG PERFORMANCE STATUS: 1 - Symptomatic but completely ambulatory  Blood pressure  115/84, pulse 88, temperature 97.6 F (36.4 C), temperature source Tympanic, resp. rate 18, height 5' 9" (1.753 m), weight 169 lb 1 oz (76.7 kg), SpO2 97 %.  LABORATORY DATA: Lab Results  Component Value Date   WBC 6.1 05/15/2021   HGB 12.4 (L) 05/15/2021   HCT 38.5 (L) 05/15/2021   MCV 88.5 05/15/2021   PLT 124 (L) 05/15/2021      Chemistry      Component Value Date/Time   NA 137 05/15/2021 1123   NA 138 12/06/2019 1438   K 4.7 05/15/2021 1123   CL 105 05/15/2021 1123   CO2 23 05/15/2021 1123   BUN 18 05/15/2021 1123   BUN 14 12/06/2019 1438   CREATININE 0.98 05/15/2021 1123   CREATININE 0.81 11/29/2015 1335      Component Value Date/Time   CALCIUM 9.5 05/15/2021 1123   ALKPHOS 122 05/15/2021 1123   AST 24 05/15/2021 1123   ALT 16 05/15/2021 1123   BILITOT 0.5 05/15/2021 1123       RADIOGRAPHIC STUDIES: MR Brain W Wo Contrast  Result Date: 05/12/2021 CLINICAL DATA:   Metastatic disease evaluation Follow up from indeterminante finding from initial brain MRI staging for recurrent lung cancer EXAM: MRI HEAD WITHOUT AND WITH CONTRAST TECHNIQUE: Multiplanar, multiecho pulse sequences of the brain and surrounding structures were obtained without and with intravenous contrast. CONTRAST:  70m GADAVIST GADOBUTROL 1 MMOL/ML IV SOLN COMPARISON:  MRI March 20, 2021. FINDINGS: Brain: Interval development small area of T2 hyperintensity in the left periventricular frontal lobe at the site of previously seen restricted diffusion/enhancement, compatible with development of encephalomalacia. No masslike enhancement at the site and no restricted diffusion. Minimal curvilinear enhancement this region is likely vascular. No abnormal enhancement elsewhere to suggest metastatic disease. Additional small remote deep white matter infarcts. Mild to moderate scattered T2/FLAIR hyperintensities in the white matter, nonspecific but compatible with chronic microvascular ischemic disease. Mild for age atrophy with ex vacuo ventricular dilation. Similar chronic mildly prominent retro cerebellar CSF. No evidence of acute infarct, hydrocephalus, mass lesion, midline shift, acute hemorrhage, or extra-axial fluid collection. Vascular: Major arterial flow voids are maintained at the skull base. Skull and upper cervical spine: Normal marrow signal. Sinuses/Orbits: Mild paranasal sinus mucosal thickening without air-fluid levels. Unremarkable orbits. Other: No mastoid effusions. IMPRESSION: 1. Interval development of encephalomalacia at the site of previously seen enhancement/restricted diffusion with resolution of the previously seen enhancement. Findings are compatible with expected evolution of a small infarct. 2. No evidence of metastatic disease or acute abnormality. Electronically Signed   By: FMargaretha SheffieldM.D.   On: 05/12/2021 09:50    ASSESSMENT AND PLAN:  This is a very pleasant 80years old white  male recently diagnosed with recurrent/metastatic non-small cell lung cancer, adenocarcinoma that was initially diagnosed as stage Ib (T2 a, N0, M0) adenocarcinoma in September 2020 status post wedge resection of the left lower lobe lung nodule and he has recurrence in September 2022 presented with metastatic mediastinal lymphadenopathy in addition to left hilar and left pleural metastatic disease. The patient had molecular studies by foundation 1 but there was insufficient material for the molecular test.  His PD-L1 expression was negative.  I will ask the pathology department to send his original resected tumor in 2020 for molecular studies as it may have enough tissue for testing. The patient started systemic chemotherapy with carboplatin for AUC of 5, Alimta 500 Mg/M2 and Keytruda 200 Mg IV every 3 weeks on April 30, 2021.  Status post 1 cycle.  He tolerated the first cycle of his treatment well except for fatigue. I recommended for him to proceed with cycle #2 today as planned. I will see him back for follow-up visit in 3 weeks for evaluation before the next cycle of his treatment. The patient was advised to call immediately if he has any other concerning symptoms in the interval. The patient voices understanding of current disease status and treatment options and is in agreement with the current care plan.  All questions were answered. The patient knows to call the clinic with any problems, questions or concerns. We can certainly see the patient much sooner if necessary.  Disclaimer: This note was dictated with voice recognition software. Similar sounding words can inadvertently be transcribed and may not be corrected upon review.

## 2021-05-28 ENCOUNTER — Other Ambulatory Visit: Payer: Medicare Other

## 2021-05-28 ENCOUNTER — Telehealth: Payer: Self-pay | Admitting: Medical Oncology

## 2021-05-28 NOTE — Telephone Encounter (Signed)
2 days ago."Diarrhea "  . Not watery but it is solid "chips " of stool.   This happened after last tx.  It is getting less and better.  I told pt to keep his lab appt and wait in lobby for someone to go over his labs with him.

## 2021-05-29 ENCOUNTER — Other Ambulatory Visit: Payer: Self-pay

## 2021-05-29 ENCOUNTER — Inpatient Hospital Stay: Payer: Medicare Other

## 2021-05-29 DIAGNOSIS — C3412 Malignant neoplasm of upper lobe, left bronchus or lung: Secondary | ICD-10-CM | POA: Diagnosis not present

## 2021-05-29 DIAGNOSIS — I4891 Unspecified atrial fibrillation: Secondary | ICD-10-CM | POA: Diagnosis not present

## 2021-05-29 DIAGNOSIS — Z5111 Encounter for antineoplastic chemotherapy: Secondary | ICD-10-CM | POA: Diagnosis not present

## 2021-05-29 DIAGNOSIS — C782 Secondary malignant neoplasm of pleura: Secondary | ICD-10-CM | POA: Diagnosis not present

## 2021-05-29 DIAGNOSIS — Z5112 Encounter for antineoplastic immunotherapy: Secondary | ICD-10-CM | POA: Diagnosis not present

## 2021-05-29 DIAGNOSIS — C3490 Malignant neoplasm of unspecified part of unspecified bronchus or lung: Secondary | ICD-10-CM

## 2021-05-29 DIAGNOSIS — R5383 Other fatigue: Secondary | ICD-10-CM | POA: Diagnosis not present

## 2021-05-29 LAB — CMP (CANCER CENTER ONLY)
ALT: 14 U/L (ref 0–44)
AST: 21 U/L (ref 15–41)
Albumin: 4.2 g/dL (ref 3.5–5.0)
Alkaline Phosphatase: 97 U/L (ref 38–126)
Anion gap: 8 (ref 5–15)
BUN: 21 mg/dL (ref 8–23)
CO2: 23 mmol/L (ref 22–32)
Calcium: 9.5 mg/dL (ref 8.9–10.3)
Chloride: 104 mmol/L (ref 98–111)
Creatinine: 0.82 mg/dL (ref 0.61–1.24)
GFR, Estimated: >60 mL/min (ref >60)
Glucose, Bld: 175 mg/dL — ABNORMAL HIGH (ref 70–99)
Potassium: 4.4 mmol/L (ref 3.5–5.1)
Sodium: 135 mmol/L (ref 135–145)
Total Bilirubin: 0.8 mg/dL (ref 0.3–1.2)
Total Protein: 7.6 g/dL (ref 6.5–8.1)

## 2021-05-29 LAB — CBC WITH DIFFERENTIAL (CANCER CENTER ONLY)
Abs Immature Granulocytes: 0.01 10*3/uL (ref 0.00–0.07)
Basophils Absolute: 0.1 10*3/uL (ref 0.0–0.1)
Basophils Relative: 2 %
Eosinophils Absolute: 0.2 10*3/uL (ref 0.0–0.5)
Eosinophils Relative: 5 %
HCT: 36.4 % — ABNORMAL LOW (ref 39.0–52.0)
Hemoglobin: 11.8 g/dL — ABNORMAL LOW (ref 13.0–17.0)
Immature Granulocytes: 0 %
Lymphocytes Relative: 41 %
Lymphs Abs: 1.4 10*3/uL (ref 0.7–4.0)
MCH: 28.3 pg (ref 26.0–34.0)
MCHC: 32.4 g/dL (ref 30.0–36.0)
MCV: 87.3 fL (ref 80.0–100.0)
Monocytes Absolute: 0.4 10*3/uL (ref 0.1–1.0)
Monocytes Relative: 12 %
Neutro Abs: 1.4 10*3/uL — ABNORMAL LOW (ref 1.7–7.7)
Neutrophils Relative %: 40 %
Platelet Count: 65 10*3/uL — ABNORMAL LOW (ref 150–400)
RBC: 4.17 MIL/uL — ABNORMAL LOW (ref 4.22–5.81)
RDW: 14.9 % (ref 11.5–15.5)
WBC Count: 3.4 10*3/uL — ABNORMAL LOW (ref 4.0–10.5)
nRBC: 0 % (ref 0.0–0.2)

## 2021-06-04 ENCOUNTER — Other Ambulatory Visit: Payer: Medicare Other

## 2021-06-05 ENCOUNTER — Other Ambulatory Visit: Payer: Self-pay

## 2021-06-05 ENCOUNTER — Inpatient Hospital Stay: Payer: Medicare Other | Attending: Physician Assistant

## 2021-06-05 DIAGNOSIS — Z800 Family history of malignant neoplasm of digestive organs: Secondary | ICD-10-CM | POA: Insufficient documentation

## 2021-06-05 DIAGNOSIS — R5383 Other fatigue: Secondary | ICD-10-CM | POA: Insufficient documentation

## 2021-06-05 DIAGNOSIS — E119 Type 2 diabetes mellitus without complications: Secondary | ICD-10-CM | POA: Diagnosis not present

## 2021-06-05 DIAGNOSIS — R0609 Other forms of dyspnea: Secondary | ICD-10-CM | POA: Diagnosis not present

## 2021-06-05 DIAGNOSIS — R531 Weakness: Secondary | ICD-10-CM | POA: Diagnosis not present

## 2021-06-05 DIAGNOSIS — C3490 Malignant neoplasm of unspecified part of unspecified bronchus or lung: Secondary | ICD-10-CM

## 2021-06-05 DIAGNOSIS — Z7901 Long term (current) use of anticoagulants: Secondary | ICD-10-CM | POA: Insufficient documentation

## 2021-06-05 DIAGNOSIS — Z5111 Encounter for antineoplastic chemotherapy: Secondary | ICD-10-CM | POA: Diagnosis present

## 2021-06-05 DIAGNOSIS — Z79899 Other long term (current) drug therapy: Secondary | ICD-10-CM | POA: Insufficient documentation

## 2021-06-05 DIAGNOSIS — Z923 Personal history of irradiation: Secondary | ICD-10-CM | POA: Diagnosis not present

## 2021-06-05 DIAGNOSIS — C3412 Malignant neoplasm of upper lobe, left bronchus or lung: Secondary | ICD-10-CM | POA: Insufficient documentation

## 2021-06-05 DIAGNOSIS — C61 Malignant neoplasm of prostate: Secondary | ICD-10-CM | POA: Diagnosis not present

## 2021-06-05 DIAGNOSIS — I4891 Unspecified atrial fibrillation: Secondary | ICD-10-CM | POA: Diagnosis not present

## 2021-06-05 DIAGNOSIS — C782 Secondary malignant neoplasm of pleura: Secondary | ICD-10-CM | POA: Diagnosis not present

## 2021-06-05 DIAGNOSIS — Z5112 Encounter for antineoplastic immunotherapy: Secondary | ICD-10-CM | POA: Diagnosis present

## 2021-06-05 LAB — CMP (CANCER CENTER ONLY)
ALT: 12 U/L (ref 0–44)
AST: 21 U/L (ref 15–41)
Albumin: 4.2 g/dL (ref 3.5–5.0)
Alkaline Phosphatase: 104 U/L (ref 38–126)
Anion gap: 9 (ref 5–15)
BUN: 21 mg/dL (ref 8–23)
CO2: 23 mmol/L (ref 22–32)
Calcium: 9.5 mg/dL (ref 8.9–10.3)
Chloride: 102 mmol/L (ref 98–111)
Creatinine: 0.91 mg/dL (ref 0.61–1.24)
GFR, Estimated: >60 mL/min (ref >60)
Glucose, Bld: 209 mg/dL — ABNORMAL HIGH (ref 70–99)
Potassium: 4.4 mmol/L (ref 3.5–5.1)
Sodium: 134 mmol/L — ABNORMAL LOW (ref 135–145)
Total Bilirubin: 0.5 mg/dL (ref 0.3–1.2)
Total Protein: 7.7 g/dL (ref 6.5–8.1)

## 2021-06-05 LAB — CBC WITH DIFFERENTIAL (CANCER CENTER ONLY)
Abs Immature Granulocytes: 0.06 10*3/uL (ref 0.00–0.07)
Basophils Absolute: 0 10*3/uL (ref 0.0–0.1)
Basophils Relative: 0 %
Eosinophils Absolute: 0.2 10*3/uL (ref 0.0–0.5)
Eosinophils Relative: 4 %
HCT: 34.7 % — ABNORMAL LOW (ref 39.0–52.0)
Hemoglobin: 11.4 g/dL — ABNORMAL LOW (ref 13.0–17.0)
Immature Granulocytes: 1 %
Lymphocytes Relative: 25 %
Lymphs Abs: 1.3 10*3/uL (ref 0.7–4.0)
MCH: 28.9 pg (ref 26.0–34.0)
MCHC: 32.9 g/dL (ref 30.0–36.0)
MCV: 87.8 fL (ref 80.0–100.0)
Monocytes Absolute: 0.8 10*3/uL (ref 0.1–1.0)
Monocytes Relative: 15 %
Neutro Abs: 2.8 10*3/uL (ref 1.7–7.7)
Neutrophils Relative %: 55 %
Platelet Count: 85 10*3/uL — ABNORMAL LOW (ref 150–400)
RBC: 3.95 MIL/uL — ABNORMAL LOW (ref 4.22–5.81)
RDW: 15.9 % — ABNORMAL HIGH (ref 11.5–15.5)
WBC Count: 5.1 10*3/uL (ref 4.0–10.5)
nRBC: 0 % (ref 0.0–0.2)

## 2021-06-05 LAB — TSH: TSH: 1.484 u[IU]/mL (ref 0.320–4.118)

## 2021-06-06 DIAGNOSIS — C3491 Malignant neoplasm of unspecified part of right bronchus or lung: Secondary | ICD-10-CM | POA: Diagnosis not present

## 2021-06-09 ENCOUNTER — Encounter (HOSPITAL_COMMUNITY): Payer: Self-pay

## 2021-06-10 ENCOUNTER — Encounter: Payer: Self-pay | Admitting: *Deleted

## 2021-06-10 NOTE — Progress Notes (Signed)
Oncology Nurse Navigator Documentation  Oncology Nurse Navigator Flowsheets 06/10/2021  Abnormal Finding Date 02/12/2021  Confirmed Diagnosis Date 03/07/2021  Diagnosis Status Confirmed Diagnosis Complete  Planned Course of Treatment Chemotherapy  Phase of Treatment Chemo  Chemotherapy Actual Start Date: 04/17/2021  Navigator Follow Up Date: 06/12/2021  Navigator Follow Up Reason: Follow-up Appointment  Navigator Location [facility]  Navigator Encounter Type Pathology Review/I followed up and found that Vincent Velez has foundation one results. Printed and will place on Dr. Worthy Flank desk.   Treatment Initiated Date 04/17/2021  Patient Visit Type Other  Treatment Phase Treatment  Barriers/Navigation Needs Coordination of Care  Interventions Coordination of Care  Acuity Level 2-Minimal Needs (1-2 Barriers Identified)  Coordination of Care Pathology  Time Spent with Patient 45

## 2021-06-11 ENCOUNTER — Ambulatory Visit: Payer: Medicare Other

## 2021-06-11 ENCOUNTER — Other Ambulatory Visit: Payer: Medicare Other

## 2021-06-11 ENCOUNTER — Ambulatory Visit: Payer: Medicare Other | Admitting: Physician Assistant

## 2021-06-11 NOTE — Progress Notes (Signed)
[place] OFFICE PROGRESS NOTE  [name], MD [place] [address] [place] [address]  DIAGNOSIS: Recurrent/metastatic non-small cell lung cancer, adenocarcinoma initially diagnosed as a stage Ib (T2 a, N0, M0) adenocarcinoma in September 2020 status post wedge resection of the left upper lobe nodule.  The patient has disease recurrence and September 2022 with metastatic mediastinal lymphadenopathy as well as left hilar and left pleural metastatic disease   PD-L1 expression was negative.   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  First dose April 30, 2021. Status post 2 cycles. Discontinued due to molecular studies  CURRENT THERAPY: Tagrisso 80 mg p.o. daily starting the next few weeks.  INTERVAL HISTORY: Vincent Velez 81 y.o. male returns to the clinic today for a follow-up visit.  The patient was recently found to have recurrent lung cancer.  The patient had a repeat biopsy which did not show any actionable mutations and his molecular studies do not show any insufficient tissue to run foundation 1 testing and low circulating tumor DNA on the guardant 360 liquid biopsy testing.   Molecular studies were performed on the initial tumor that was resected in 2020 which show that he is positive for EGFR mutation.   Overall, the patient is feeling fairly well today without any concerning complaints.  He did tolerate his first 2 cycles of chemotherapy well except for some generalized weakness compared to his baseline.  Denies any recent fever, chills, or night sweats.  He denies any unexplained weight loss since his last  appointment.  Denies any chest pain or recent hemoptysis.  He reports his baseline dyspnea on exertion.  Denies significant cough.  Denies any nausea, vomiting, diarrhea, or constipation.  Denies any headache or visual changes except for his baseline visual changes secondary to glaucoma.  The patient is here today for evaluation and repeat blood work and for more detailed discussion about his current condition and recommended treatment options.    MEDICAL HISTORY: Past Medical History:  Diagnosis Date   AAA (abdominal aortic aneurysm)    Anxiety    Arthritis    Asthma    when younger   Atrial fibrillation (HCC)    CAD (coronary artery disease)    Cataract    removed both   COPD (chronic obstructive pulmonary disease) (HCC)    Diabetes mellitus    Type II   Dysrhythmia    afib   Esophageal reflux    Family history of malignant neoplasm of gastrointestinal tract    Heart murmur    Hiatal hernia    Hyperlipemia    Hypertension    Lung nodule    a. PET scan highly suspicious for malignancy. Bronch with biopsy to be done after TAVR   Malignant neoplasm of prostate [facility])    prostate    Peripheral vascular disease ([place])    Pneumonia    Stricture and stenosis of esophagus     ALLERGIES:  is allergic to macrolides and ketolides.  MEDICATIONS:  Current Outpatient Medications  Medication Sig Dispense Refill   ACCU-CHEK GUIDE test strip USE TO CHECK YOUR BLOOD SUGAR ONCE DAILY     acetaminophen (TYLENOL)  500 MG tablet Take 2 tablets (1,000 mg total) by mouth every 6 (six) hours as needed for mild pain or fever. 30 tablet 0   diltiazem (CARDIZEM SR) 60 MG 12 hr capsule Take 1 capsule (60 mg total) by mouth every 12 (twelve) hours. 60 capsule 7   dorzolamide-timolol (COSOPT) 22.3-6.8 MG/ML ophthalmic solution Place 1 drop into both eyes in the morning, at noon, and at bedtime.     folic acid (FOLVITE) 1 MG tablet Take 1 tablet (1 mg total) by mouth daily. 30 tablet 4   glimepiride  (AMARYL) 2 MG tablet Take 2 mg by mouth daily with breakfast.     JARDIANCE 25 MG TABS tablet Take 25 mg by mouth daily.     metFORMIN (GLUCOPHAGE-XR) 500 MG 24 hr tablet Take 1,000 mg by mouth every evening.      metoprolol succinate (TOPROL-XL) 50 MG 24 hr tablet Take 50 mg by mouth daily.     Multiple Vitamins-Minerals (MULTIVITAMIN WITH MINERALS) tablet Take 1 tablet by mouth daily.     Multiple Vitamins-Minerals (ZINC PO) Take 1 tablet by mouth daily.     osimertinib mesylate (TAGRISSO) 80 MG tablet Take 1 tablet (80 mg total) by mouth daily. 30 tablet 3   pantoprazole (PROTONIX) 40 MG tablet Take 40 mg by mouth daily.     prochlorperazine (COMPAZINE) 10 MG tablet Take 1 tablet (10 mg total) by mouth every 6 (six) hours as needed for nausea or vomiting. 30 tablet 0   simvastatin (ZOCOR) 40 MG tablet Take 40 mg by mouth at bedtime.     XARELTO 20 MG TABS tablet TAKE 1 TABLET BY MOUTH  DAILY WITH SUPPER 90 tablet 1   No current facility-administered medications for this visit.    SURGICAL HISTORY:  Past Surgical History:  Procedure Laterality Date   ABDOMINAL AORTA STENT     CARDIAC CATHETERIZATION     CARDIOVERSION N/A 12/04/2015   Procedure: CARDIOVERSION;  Surgeon: Sanda Klein, MD;  Location: [place] [facility];  Service: Cardiovascular;  Laterality: N/A;   COLONOSCOPY     FINGER SURGERY Right    middle finger- amputation   INSERTION PROSTATE RADIATION SEED     MEDIASTINOSCOPY N/A 01/06/2019   Procedure: MEDIASTINOSCOPY WITH BIOPIES;  Surgeon: Gaye Pollack, MD;  Location: [place];  Service: Thoracic;  Laterality: N/A;   RIGHT/LEFT HEART CATH AND CORONARY ANGIOGRAPHY N/A 10/26/2018   Procedure: RIGHT/LEFT HEART CATH AND CORONARY ANGIOGRAPHY;  Surgeon: Burnell Blanks, MD;  Location: [place] CV [facility];  Service: Cardiovascular;  Laterality: N/A;   TEE WITHOUT CARDIOVERSION N/A 12/06/2018   Procedure: TRANSESOPHAGEAL ECHOCARDIOGRAM (TEE);  Surgeon: Burnell Blanks, MD;   Location: [place] CV [facility];  Service: Open Heart Surgery;  Laterality: N/A;   TRANSCATHETER AORTIC VALVE REPLACEMENT, TRANSFEMORAL N/A 12/06/2018   Procedure: TRANSCATHETER AORTIC VALVE REPLACEMENT, TRANSFEMORAL;  Surgeon: Burnell Blanks, MD;  Location: [place] [facility];  Service: Open Heart Surgery;  Laterality: N/A;   UPPER GASTROINTESTINAL ENDOSCOPY     VIDEO ASSISTED THORACOSCOPY (VATS)/THOROCOTOMY Left 02/02/2019   Procedure: VIDEO ASSISTED THORACOSCOPY (VATS)/THOROCOTOMY;  Surgeon: Gaye Pollack, MD;  Location: [place] OR;  Service: Thoracic;  Laterality: Left;   VIDEO BRONCHOSCOPY N/A 01/06/2019   Procedure: VIDEO BRONCHOSCOPY;  Surgeon: Gaye Pollack, MD;  Location: [place] OR;  Service: Thoracic;  Laterality: N/A;   VIDEO BRONCHOSCOPY WITH ENDOBRONCHIAL ULTRASOUND N/A 03/07/2021   Procedure: VIDEO BRONCHOSCOPY WITH ENDOBRONCHIAL ULTRASOUND;  Surgeon: [name], MD;  Location:  [facility];  Service: Thoracic;  Laterality: N/A;   WEDGE RESECTION Left 02/02/2019   Procedure: LUNG RESECTION;  Surgeon: Gaye Pollack, MD;  Location: [facility];  Service: Thoracic;  Laterality: Left;    REVIEW OF SYSTEMS:   Review of Systems  Constitutional: Positive for generalized weakness and fatigue.  Negative for appetite change, chills, fatigue, fever and unexpected weight change.  HENT:   Negative for mouth sores, nosebleeds, sore throat and trouble swallowing.   Eyes: Negative for eye problems and icterus.  Respiratory: Positive for baseline dyspnea on exertion.  Negative for cough, hemoptysis, and wheezing.   Cardiovascular: Negative for chest pain and leg swelling.  Gastrointestinal: Negative for abdominal pain, constipation, diarrhea, nausea and vomiting.  Genitourinary: Negative for bladder incontinence, difficulty urinating, dysuria, frequency and hematuria.   Musculoskeletal: Negative for back pain, gait problem, neck pain and neck stiffness.  Skin: Negative for itching and rash.   Neurological: Negative for dizziness, extremity weakness, gait problem, headaches, light-headedness and seizures.  Hematological: Negative for adenopathy. Does not bruise/bleed easily.  Psychiatric/Behavioral: Negative for confusion, depression and sleep disturbance. The patient is not nervous/anxious.     PHYSICAL EXAMINATION:  Blood pressure 106/75, pulse 66, temperature 97.8 F (36.6 C), temperature source Temporal, resp. rate 17, height _0  (1.753 m), weight 168 lb (76.2 kg), SpO2 97 %.  ECOG PERFORMANCE STATUS: 1-2  Physical Exam  Constitutional: Oriented to person, place, and time and well-developed, well-nourished, and in no distress.  HENT:  Head: Normocephalic and atraumatic.  Mouth/Throat: Oropharynx is clear and moist. No oropharyngeal exudate.  Eyes: Conjunctivae are normal. Right eye exhibits no discharge. Left eye exhibits no discharge. No scleral icterus.  Neck: Normal range of motion. Neck supple.  Cardiovascular: Normal rate, regular rhythm, systolic murmur noted.  And intact distal pulses.   Pulmonary/Chest: Effort normal and breath sounds normal. No respiratory distress. No wheezes. No rales.  Abdominal: Soft. Bowel sounds are normal. Exhibits no distension and no mass. There is no tenderness.  Musculoskeletal: Normal range of motion. Exhibits no edema.  Lymphadenopathy:    No cervical adenopathy.  Neurological: Alert and oriented to person, place, and time. Exhibits normal muscle tone. Gait normal. Coordination normal.  Skin: Skin is warm and dry. No rash noted. Not diaphoretic. No erythema. No pallor.  Psychiatric: Mood, memory and judgment normal.  Vitals reviewed.  LABORATORY DATA: Lab Results  Component Value Date   WBC 5.1 06/12/2021   HGB 11.3 (L) 06/12/2021   HCT 34.3 (L) 06/12/2021   MCV 88.9 06/12/2021   PLT 171 06/12/2021      Chemistry      Component Value Date/Time   NA 136 06/12/2021 0835   NA 138 12/06/2019 1438   K 4.0 06/12/2021 0835    CL 106 06/12/2021 0835   CO2 21 (L) 06/12/2021 0835   BUN 18 06/12/2021 0835   BUN 14 12/06/2019 1438   CREATININE 0.82 06/12/2021 0835   CREATININE 0.81 11/29/2015 1335      Component Value Date/Time   CALCIUM 9.4 06/12/2021 0835   ALKPHOS 94 06/12/2021 0835   AST 20 06/12/2021 0835   ALT 13 06/12/2021 0835   BILITOT 0.5 06/12/2021 0835       RADIOGRAPHIC STUDIES:  No results found.   ASSESSMENT/PLAN:   This is a very pleasant 81 year old Caucasian male male recently diagnosed with recurrent/metastatic non-small cell lung cancer, adenocarcinoma that was initially diagnosed as stage Ib (T2 a, N0, M0) adenocarcinoma in September  2020 status post wedge resection of the left lower lobe lung nodule and he had recurrence in September 2022 presented with metastatic mediastinal lymphadenopathy in addition to left hilar and left pleural metastatic disease.  The patient was found to have EGFR mutation on the original resected tumor from 2020.   Patient underwent 2 cycles of systemic palliative chemotherapy with carboplatin AUC of 5 and Alimta 500 mg per metered squared and Keytruda 200 mg IV every 3 weeks.  He received 2 cycles.  This will be discontinued due to receiving the results of his molecular studies which show EGFR mutation.  The patient was seen with Dr. Julien Nordmann today.  Dr. Julien Nordmann had a lengthy discussion today with the patient his current condition and recommended treatment options.  Dr. Julien Nordmann recommends that the patient undergo targeted treatment with Tagrisso 80 mg p.o. daily. However, repeat EKG today showed persistent prolonged QTC at 521 ms.  He is not on any QTC prolonging drugs per pharmacy.  Dr. Julien Nordmann recommends starting the patient on Tarceva p.o. daily. The patient is interested in this option and he is expected to start his first treatment the next few weeks.  Dr. Julien Nordmann recommends starting treatment in approximately 2 weeks to allow time for his last round of  chemotherapy and immunotherapy to clear from his system to avoid additional toxicities with Tarceva.   The patient was advised to discontinue his Protonix.  We will arrange for the patient to receive education by the oral chemotherapy pharmacist today.  The adverse side effects of treatment were discussed including but not limited to diarrhea and skin rash, decreased appetite, and fatigue.  We will see the patient back for follow-up visit in 4 weeks for evaluation and repeat blood work.  Dr. Julien Nordmann also recommends reaching for restaging CT scan of the chest next week to establish new baseline prior to starting targeted treatment with Tagrisso and to assess treatment response to his first 2 rounds of chemotherapy/immunotherapy.   The patient was advised to call immediately if he has any concerning symptoms in the interval. The patient voices understanding of current disease status and treatment options and is in agreement with the current care plan. All questions were answered. The patient knows to call the clinic with any problems, questions or concerns. We can certainly see the patient much sooner if necessary            Orders Placed This Encounter  Procedures   CT Chest W Contrast    Standing Status:   Future    Standing Expiration Date:   06/12/2022    Order Specific Question:   If indicated for the ordered procedure, I authorize the administration of contrast media per Radiology protocol    Answer:   Yes    Order Specific Question:   Preferred imaging location?    Answer:   [facility]   CBC with Differential ([place] Only)    Standing Status:   Future    Standing Expiration Date:   06/12/2022   CMP ([place] only)    Standing Status:   Future    Standing Expiration Date:   06/12/2022   EKG 12-Lead      Sarvesh Meddaugh L Keeli Roberg, PA-C 06/12/21  ADDENDUM: Hematology/Oncology Attending: I had a face-to-face encounter with the patient today.  I  reviewed his record, lab and recommended his care plan.  This is a very pleasant 81 years old white male diagnosed with recurrent/metastatic non-small cell lung cancer, adenocarcinoma that  was initially diagnosed as a stage Ib (T2 a, N0, M0) non-small cell lung cancer, adenocarcinoma in September 2020 status post wedge resection of the left lower lobe lung nodules and he has disease recurrence in September 2022 with metastatic mediastinal lymphadenopathy in addition to left hilar and left pleural based the metastatic disease.  The new biopsy was sent for molecular study but there was insufficient material for molecular studies by foundation 1 or guardant 360. The patient is started systemic chemotherapy with carboplatin for AUC of 5, Alimta 500 Mg/M2 and Keytruda 200 Mg IV every 3 weeks status post 2 cycles.  During his treatment we sent the archival tissue from his previous surgical resection to foundation 1 for molecular studies and the final report came recently and showing that the patient has positive EGFR mutation with deletion in exon 19. I had a lengthy discussion with the patient and his daughter today about his current condition and treatment options. I recommended for the patient to have repeat CT scan of the chest for restaging of his disease after the first 2 cycles of his chemotherapy. I discussed with the patient switching his treatment to targeted therapy and he and his daughter were interested in this option.  Initially we consider The patient for treatment with Tagrisso but EKG showed QT prolongation which is concerning to start the patient on treatment with Tagrisso for risk of arrhythmia.  We will consider him for treatment with first generation EGFR tyrosine kinase inhibitor like Tarceva 150 mg p.o. daily.  We discussed with the patient the adverse effect of this treatment including but not limited to skin rash, diarrhea, dry skin, paronychia, liver or renal dysfunction.  He will also see  the pharmacist for oral oncolytic for discussion and education about his treatment and also to help The patient with refill of his medication. Will consider restarting his treatment in around 2 weeks from now after reviewing his imaging studies. The patient will come back for follow-up visit in 4 weeks for evaluation and management of any adverse effect of his treatment. He was advised to call immediately if he has any other concerning symptoms in the interval. The total time spent in the appointment was 40 minutes. Disclaimer: This note was dictated with voice recognition software. Similar sounding words can inadvertently be transcribed and may be missed upon review.  Eilleen Kempf, MD 06/12/21

## 2021-06-12 ENCOUNTER — Telehealth: Payer: Self-pay | Admitting: Pharmacist

## 2021-06-12 ENCOUNTER — Telehealth: Payer: Self-pay

## 2021-06-12 ENCOUNTER — Other Ambulatory Visit (HOSPITAL_COMMUNITY): Payer: Self-pay

## 2021-06-12 ENCOUNTER — Inpatient Hospital Stay: Payer: Medicare Other

## 2021-06-12 ENCOUNTER — Encounter: Payer: Self-pay | Admitting: Internal Medicine

## 2021-06-12 ENCOUNTER — Inpatient Hospital Stay (HOSPITAL_BASED_OUTPATIENT_CLINIC_OR_DEPARTMENT_OTHER): Payer: Medicare Other | Admitting: Physician Assistant

## 2021-06-12 ENCOUNTER — Other Ambulatory Visit: Payer: Self-pay | Admitting: Internal Medicine

## 2021-06-12 ENCOUNTER — Other Ambulatory Visit: Payer: Self-pay

## 2021-06-12 VITALS — BP 106/75 | HR 66 | Temp 97.8°F | Resp 17 | Ht 69.0 in | Wt 168.0 lb

## 2021-06-12 DIAGNOSIS — C3492 Malignant neoplasm of unspecified part of left bronchus or lung: Secondary | ICD-10-CM | POA: Diagnosis not present

## 2021-06-12 DIAGNOSIS — C3490 Malignant neoplasm of unspecified part of unspecified bronchus or lung: Secondary | ICD-10-CM

## 2021-06-12 DIAGNOSIS — I4891 Unspecified atrial fibrillation: Secondary | ICD-10-CM | POA: Diagnosis not present

## 2021-06-12 DIAGNOSIS — Z7189 Other specified counseling: Secondary | ICD-10-CM

## 2021-06-12 DIAGNOSIS — C782 Secondary malignant neoplasm of pleura: Secondary | ICD-10-CM | POA: Diagnosis not present

## 2021-06-12 DIAGNOSIS — R5383 Other fatigue: Secondary | ICD-10-CM | POA: Diagnosis not present

## 2021-06-12 DIAGNOSIS — R531 Weakness: Secondary | ICD-10-CM | POA: Diagnosis not present

## 2021-06-12 DIAGNOSIS — C61 Malignant neoplasm of prostate: Secondary | ICD-10-CM | POA: Diagnosis not present

## 2021-06-12 DIAGNOSIS — C3412 Malignant neoplasm of upper lobe, left bronchus or lung: Secondary | ICD-10-CM | POA: Diagnosis not present

## 2021-06-12 LAB — CBC WITH DIFFERENTIAL (CANCER CENTER ONLY)
Abs Immature Granulocytes: 0.04 10*3/uL (ref 0.00–0.07)
Basophils Absolute: 0 10*3/uL (ref 0.0–0.1)
Basophils Relative: 1 %
Eosinophils Absolute: 0.3 10*3/uL (ref 0.0–0.5)
Eosinophils Relative: 7 %
HCT: 34.3 % — ABNORMAL LOW (ref 39.0–52.0)
Hemoglobin: 11.3 g/dL — ABNORMAL LOW (ref 13.0–17.0)
Immature Granulocytes: 1 %
Lymphocytes Relative: 23 %
Lymphs Abs: 1.2 10*3/uL (ref 0.7–4.0)
MCH: 29.3 pg (ref 26.0–34.0)
MCHC: 32.9 g/dL (ref 30.0–36.0)
MCV: 88.9 fL (ref 80.0–100.0)
Monocytes Absolute: 0.8 10*3/uL (ref 0.1–1.0)
Monocytes Relative: 15 %
Neutro Abs: 2.8 10*3/uL (ref 1.7–7.7)
Neutrophils Relative %: 53 %
Platelet Count: 171 10*3/uL (ref 150–400)
RBC: 3.86 MIL/uL — ABNORMAL LOW (ref 4.22–5.81)
RDW: 17 % — ABNORMAL HIGH (ref 11.5–15.5)
WBC Count: 5.1 10*3/uL (ref 4.0–10.5)
nRBC: 0 % (ref 0.0–0.2)

## 2021-06-12 LAB — CMP (CANCER CENTER ONLY)
ALT: 13 U/L (ref 0–44)
AST: 20 U/L (ref 15–41)
Albumin: 4 g/dL (ref 3.5–5.0)
Alkaline Phosphatase: 94 U/L (ref 38–126)
Anion gap: 9 (ref 5–15)
BUN: 18 mg/dL (ref 8–23)
CO2: 21 mmol/L — ABNORMAL LOW (ref 22–32)
Calcium: 9.4 mg/dL (ref 8.9–10.3)
Chloride: 106 mmol/L (ref 98–111)
Creatinine: 0.82 mg/dL (ref 0.61–1.24)
GFR, Estimated: >60 mL/min (ref >60)
Glucose, Bld: 212 mg/dL — ABNORMAL HIGH (ref 70–99)
Potassium: 4 mmol/L (ref 3.5–5.1)
Sodium: 136 mmol/L (ref 135–145)
Total Bilirubin: 0.5 mg/dL (ref 0.3–1.2)
Total Protein: 7.2 g/dL (ref 6.5–8.1)

## 2021-06-12 MED ORDER — ERLOTINIB HCL 150 MG PO TABS
150.0000 mg | ORAL_TABLET | Freq: Every day | ORAL | 3 refills | Status: DC
  Filled 2021-06-12 – 2021-06-27 (×2): qty 30, 30d supply, fill #0
  Filled 2021-07-24: qty 30, 30d supply, fill #1

## 2021-06-12 MED ORDER — OSIMERTINIB MESYLATE 80 MG PO TABS
80.0000 mg | ORAL_TABLET | Freq: Every day | ORAL | 3 refills | Status: DC
  Filled 2021-06-12: qty 30, 30d supply, fill #0

## 2021-06-12 NOTE — Telephone Encounter (Signed)
Oral Oncology Pharmacist Encounter  Received new prescription for Tarceva (erlotinib) for the treatment of metastatic, non-small cell lung cancer with presence of EGFR exon 19 deletion, planned duration until disease progression or unacceptable drug toxicity.  CBC w/ Diff and CMP from 06/12/21 assessed, no baseline dose adjustments required. Prescription dose and frequency assessed for appropriateness. Per MD plan to start therapy in 2 weeks after repeat imaging studies are complete.   Current medication list in Epic reviewed, DDIs with Tarceva identified: Category C DDI between Tarceva and Diltiazem - diltiazem, a moderate CYP3A4 inhibitor may increase serum concentrations of Tarceva. Recommend monitoring for increase side effects from Tarceva. No dose adjustments or change in therapy recommended at this time. Category C DDI between Tarceva and Simvastatin - Tarceva may increase risk of ADEs of simvastatin - support for this is limited to case reports. No change in therapy warranted at this time.   Evaluated chart and no patient barriers to medication adherence noted.   Patient agreement for treatment documented in MD note on 06/12/21.  Prescription has been e-scribed to the [facility] for benefits analysis and approval.  Oral Oncology Clinic will continue to follow for insurance authorization, copayment issues, initial counseling and start date.  Leron Croak, PharmD, BCPS Hematology/Oncology Clinical Pharmacist [facility] [place] [phone] 06/12/2021 5:57 PM

## 2021-06-12 NOTE — Telephone Encounter (Signed)
Oral Oncology Patient Advocate Encounter   Was successful in securing patient an $47 grant from Patient [place] [facility]) to provide copayment coverage for Tarceva.  This will keep the out of pocket expense at $0.     The billing information is as follows and has been shared with [place].   Member ID: [id number] Group ID: [id number] RxBin: [id number] Dates of Eligibility: 03/14/21 through 06/11/22  Fund:  [place] Patient [place] Phone [phone] Fax [phone] 06/12/2021 2:53 PM

## 2021-06-12 NOTE — Progress Notes (Signed)
DISCONTINUE OFF PATHWAY REGIMEN - Non-Small Cell Lung   [id number]:Pembrolizumab 200 mg  IV D1 + Pemetrexed 500 mg/m2 IV D1 + Carboplatin AUC=5 IV D1 q21 Days:   A cycle is every 21 days:     Pembrolizumab      Pemetrexed      Carboplatin   **Always confirm dose/schedule in your pharmacy ordering system**  REASON: Other Reason PRIOR TREATMENT: Off Pathway: Pembrolizumab 200 mg  IV D1 + Pemetrexed 500 mg/m2 IV D1 + Carboplatin AUC=5 IV D1 q21 Days TREATMENT RESPONSE: Unable to Evaluate  START ON PATHWAY REGIMEN - Non-Small Cell Lung     A cycle is every 28 days:     Osimertinib   **Always confirm dose/schedule in your pharmacy ordering system**  Patient Characteristics: Stage IV Metastatic, Nonsquamous, Molecular Analysis Completed, Molecular Alteration Present and Eligible for Molecular Targeted Therapy, Initial Molecular Targeted Therapy, EGFR Mutation - Common (Exon 19 Deletion or Exon 21 L858R Substitution) Therapeutic Status: Stage IV Metastatic Histology: Nonsquamous Cell Broad Molecular Profiling Status: Molecular Analysis Completed Molecular Analysis Results: Alteration Present and Eligible for Molecular Targeted Therapy Molecular Alteration Present: EGFR Mutation - Common (Exon 19 Deletion or Exon 21 L858R Substitution) Molecular Targeted Line of Therapy: Initial Molecular Targeted Therapy Intent of Therapy: Non-Curative / Palliative Intent, Discussed with Patient

## 2021-06-12 NOTE — Telephone Encounter (Signed)
Oral Oncology Patient Advocate Encounter   Was successful in securing patient a $4000 grant from [employer] to provide copayment coverage for Tarceva.  This will keep the out of pocket expense at $0.     The billing information is as follows and has been shared with [place].   Member ID: [id number] Group ID: [id number] RxBin: [id number] PCN: [id number] Dates of Eligibility: 06/12/21 through 06/12/22  Fund name:  [employer].  [place] Patient [place] Phone [phone] Fax [phone] 06/12/2021 2:55 PM

## 2021-06-12 NOTE — Telephone Encounter (Signed)
Oral Oncology Pharmacist Encounter  Prior Authorization for Tarceva (erlotinib) has been approved.    PA# [id number] Effective dates: 06/12/21 through 05/31/22  Oral Oncology Clinic will continue to follow.   Leron Croak, PharmD, BCPS Hematology/Oncology Clinical Pharmacist [facility] [phone] 06/12/2021 6:06 PM

## 2021-06-12 NOTE — Telephone Encounter (Signed)
Oral Oncology Pharmacist Encounter   Received notification from [employer] that prior authorization for Tarceva (erlotinib) is required.   PA submitted on CoverMyMeds Key: [id number] Status is pending   Oral Oncology Clinic will continue to follow.   Leron Croak, PharmD, BCPS Hematology/Oncology Clinical Pharmacist [facility] [phone] 06/12/2021 6:05 PM

## 2021-06-13 ENCOUNTER — Other Ambulatory Visit (HOSPITAL_COMMUNITY): Payer: Self-pay

## 2021-06-18 ENCOUNTER — Other Ambulatory Visit: Payer: Medicare Other

## 2021-06-19 ENCOUNTER — Other Ambulatory Visit: Payer: Medicare Other

## 2021-06-25 ENCOUNTER — Encounter (HOSPITAL_COMMUNITY): Payer: Self-pay

## 2021-06-25 ENCOUNTER — Other Ambulatory Visit: Payer: Self-pay

## 2021-06-25 ENCOUNTER — Other Ambulatory Visit: Payer: Medicare Other

## 2021-06-25 ENCOUNTER — Ambulatory Visit (HOSPITAL_COMMUNITY)
Admission: RE | Admit: 2021-06-25 | Discharge: 2021-06-25 | Disposition: A | Discharge status: Home / Self Care | Payer: Medicare Other | Source: Ambulatory Visit | Attending: Physician Assistant | Admitting: Physician Assistant

## 2021-06-25 ENCOUNTER — Other Ambulatory Visit (HOSPITAL_COMMUNITY): Payer: Self-pay

## 2021-06-25 DIAGNOSIS — C3492 Malignant neoplasm of unspecified part of left bronchus or lung: Secondary | ICD-10-CM | POA: Insufficient documentation

## 2021-06-25 DIAGNOSIS — I700 Atherosclerosis of aorta: Secondary | ICD-10-CM | POA: Diagnosis not present

## 2021-06-25 DIAGNOSIS — R911 Solitary pulmonary nodule: Secondary | ICD-10-CM | POA: Diagnosis not present

## 2021-06-25 DIAGNOSIS — J90 Pleural effusion, not elsewhere classified: Secondary | ICD-10-CM | POA: Diagnosis not present

## 2021-06-25 IMAGING — CT CT CHEST W/ CM
2 of 4 series · 14 of 36 positions shown, 17 images · IV contrast (OMNIPAQUE)
Comparison: Chest CT [DATE].

CLINICAL DATA: 80-year-old male with history of non-small cell lung
cancer diagnosed in [IS] with recurrence in [IS]. Follow-up study.

EXAM:
CT CHEST WITH CONTRAST
TECHNIQUE: Multidetector CT imaging of the chest was performed during
intravenous contrast administration.

[Series 2: axial st · axial · 0.74mm/px · z∈[-274,-10]mm · 11 of 154 slices shown, 14 images]
[im 11/154  mediastinal]
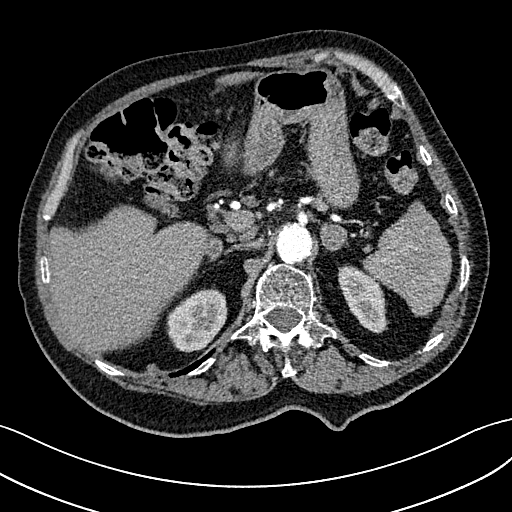
[im 11/154  lung]
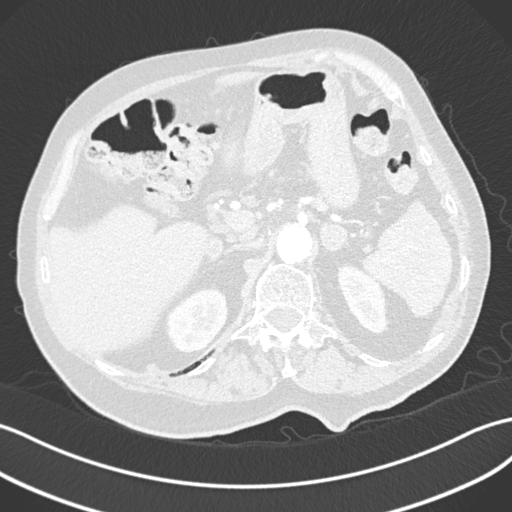
[im 22/154  lung]
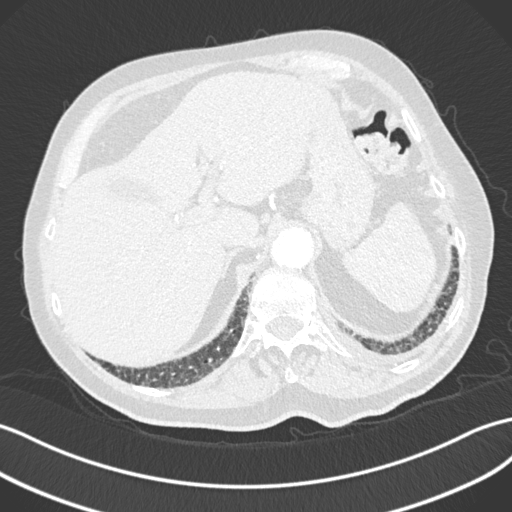
[im 33/154  lung]
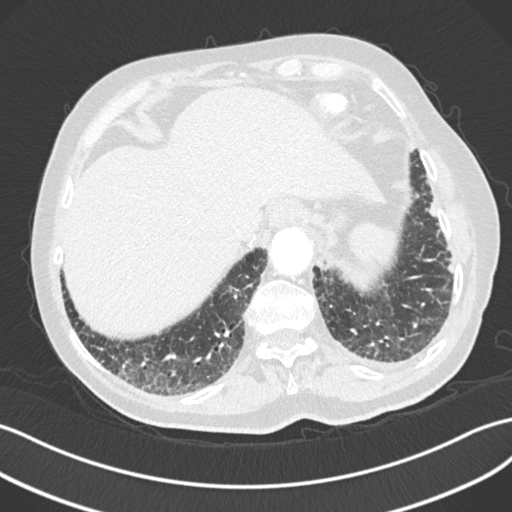
[im 55/154  lung]
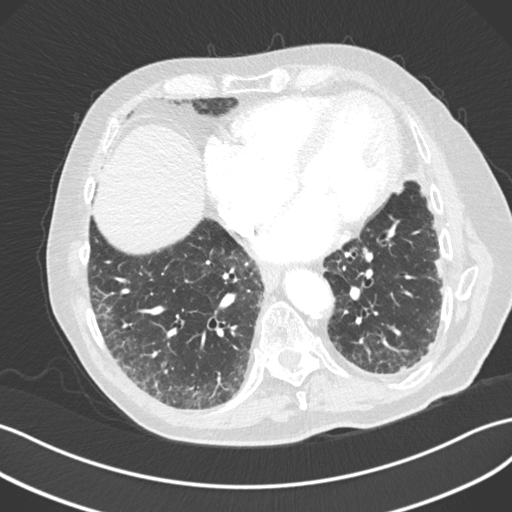
[im 66/154  mediastinal]
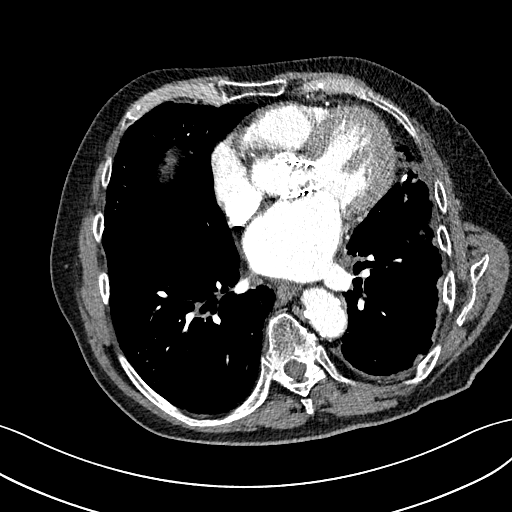
[im 66/154  lung]
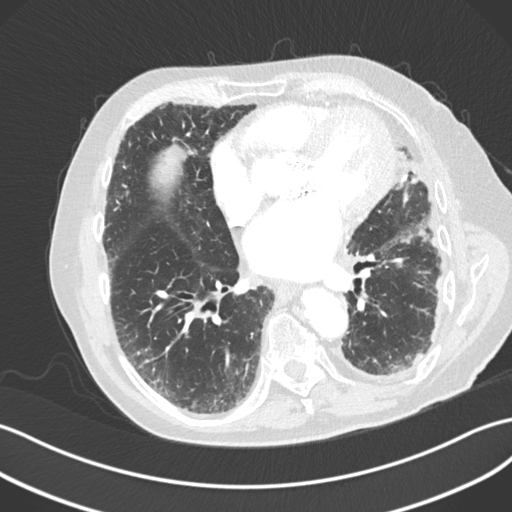
[im 77/154  lung]
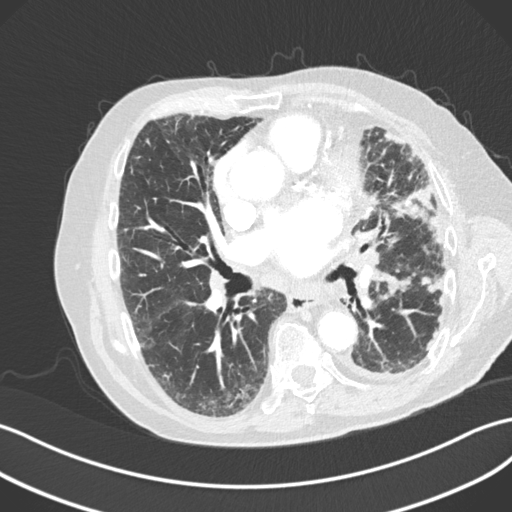
[im 88/154  lung]
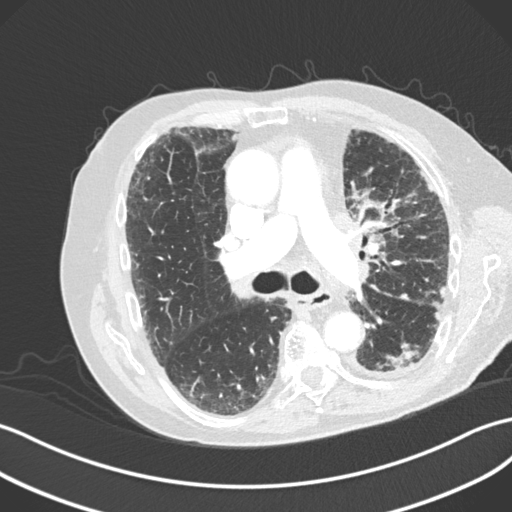
[im 99/154  lung]
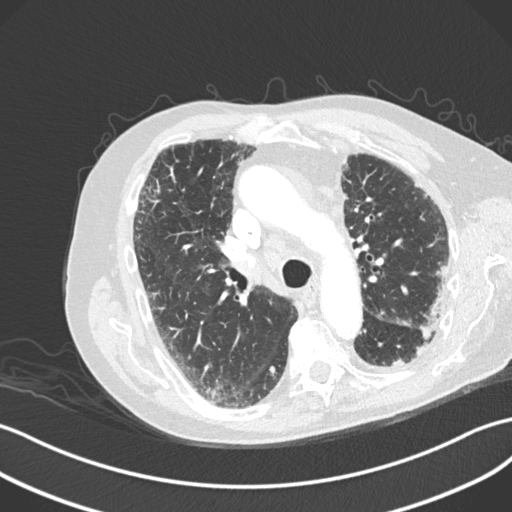
[im 121/154  mediastinal]
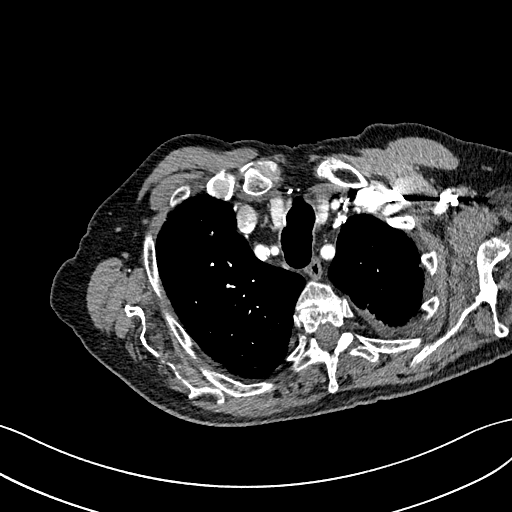
[im 121/154  lung]
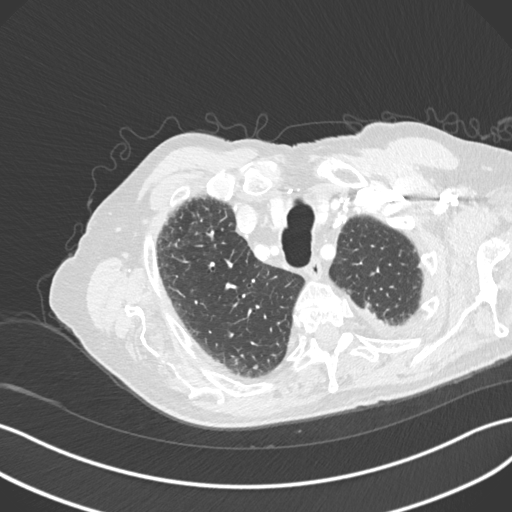
[im 132/154  lung]
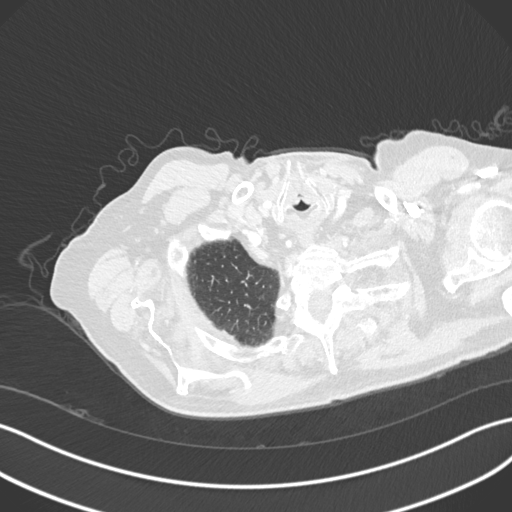
[im 143/154  lung]
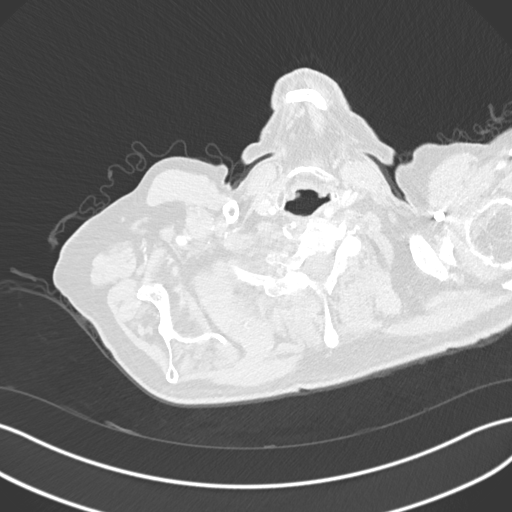

[Series 6: coronal · coronal · 0.64mm/px · 3 of 154 slices shown]
[im 31/154  lung]
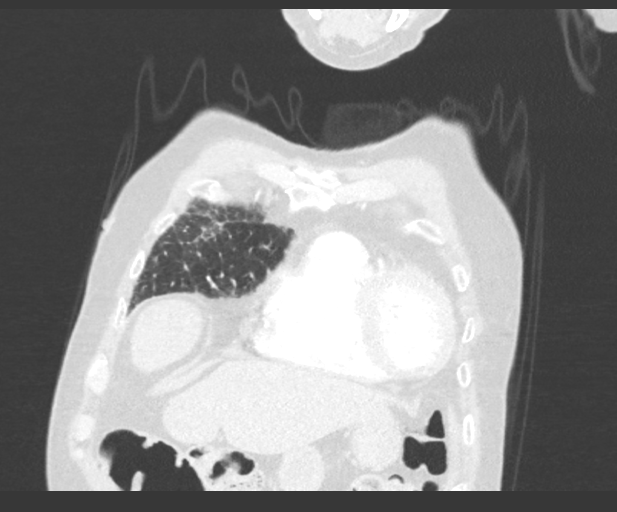
[im 62/154  lung]
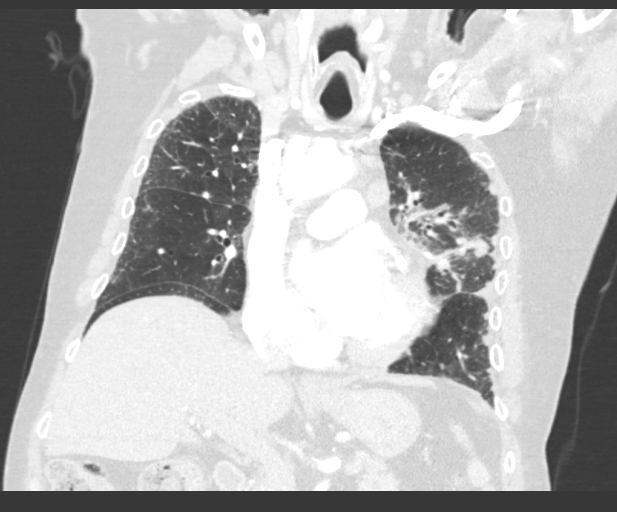
[im 92/154  lung]
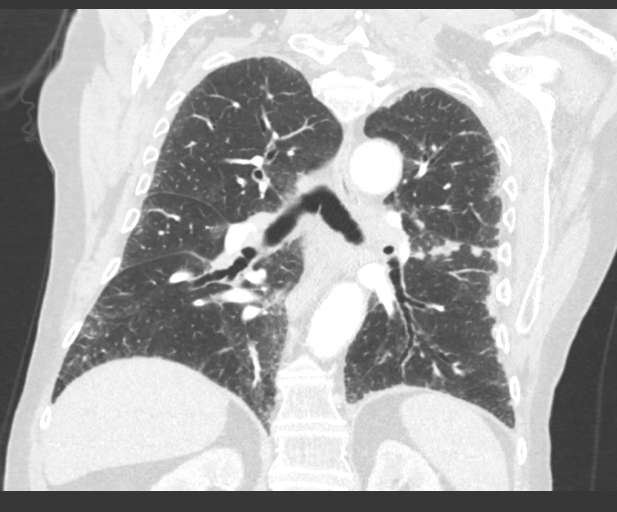

[14 of 36 positions shown; findings below may reference images not displayed]

RADIATION DOSE REDUCTION: This exam was performed according to the
departmental dose-optimization program which includes automated
exposure control, adjustment of the mA and/or kV according to
patient size and/or use of iterative reconstruction technique.

CONTRAST:  75mL OMNIPAQUE IOHEXOL 300 MG/ML  SOLN
FINDINGS: Cardiovascular: Heart size is mildly enlarged. There is no
significant pericardial fluid, thickening or pericardial
calcification. There is aortic atherosclerosis, as well as
atherosclerosis of the great vessels of the mediastinum and the
coronary arteries, including calcified atherosclerotic plaque in the
left main, left anterior descending, left circumflex and right
coronary arteries. Status post TAVR.

Mediastinum/Nodes: Multiple prominent borderline enlarged and mildly
enlarged mediastinal and bilateral hilar lymph nodes, largest of
which measures up to 1.7 cm in short axis in the right hilar nodal
station (axial image 62 of series 2) and 1.5 cm in short axis in the
prevascular nodal station (axial image 61 of series 2). Esophagus is
unremarkable in appearance. No axillary lymphadenopathy.

Lungs/Pleura: Increasing nodular and mass-like thickening of the
pleural surfaces throughout the left hemithorax, compatible with
progressive metastatic disease to the pleura. This is most evident
adjacent to the suture line in the inferior aspect of the lingula.
No new suspicious appearing pulmonary nodules or masses are noted in
the right lung. No acute consolidative airspace disease. Trace left
pleural effusion. No right pleural effusion. Widespread peripheral
predominant areas of ground-glass attenuation, septal thickening and
subpleural reticulation are noted throughout the lungs bilaterally
with no discernible craniocaudal gradient, progressive compared to
prior examinations.

Upper Abdomen: Aortic atherosclerosis. 1.8 cm left adrenal nodule,
stable, previously characterized as an adenoma.

Musculoskeletal: There are no aggressive appearing lytic or blastic
lesions noted in the visualized portions of the skeleton.
IMPRESSION: 1. Progressive metastatic disease throughout the pleura of the left
hemithorax with new right hilar lymphadenopathy and increasing
prevascular lymphadenopathy.
2. The appearance of the lung parenchyma suggests progressive
interstitial lung disease, at this time, categorized as most
compatible with an alternative diagnosis (not usual interstitial
pneumonia) per current ATS guidelines. Outpatient referral to
Pulmonology for further clinical evaluation is suggested.
3. Aortic atherosclerosis, in addition to left main and three-vessel
coronary artery disease.
4. Mild cardiomegaly.
5. Stable left adrenal nodule, previously characterized as a benign
adenoma.

Aortic Atherosclerosis ([IS]-[IS]).

## 2021-06-25 MED ORDER — IOHEXOL 300 MG/ML  SOLN
75.0000 mL | Freq: Once | INTRAMUSCULAR | Status: AC | PRN
  Administered 2021-06-25: 16:00:00 75 mL via INTRAVENOUS

## 2021-06-25 MED ORDER — SODIUM CHLORIDE (PF) 0.9 % IJ SOLN
INTRAMUSCULAR | Status: AC
  Filled 2021-06-25: qty 50

## 2021-06-26 ENCOUNTER — Other Ambulatory Visit: Payer: Medicare Other

## 2021-06-27 ENCOUNTER — Other Ambulatory Visit (HOSPITAL_COMMUNITY): Payer: Self-pay

## 2021-06-27 NOTE — Telephone Encounter (Signed)
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  ° °Counseled patient on administration, dosing, side effects, monitoring, drug-food interactions, safe handling, storage, and disposal. ° °Patient will take Tarceva 150 tablets, 1 tablet by mouth once daily, on an empty stomach, 1 hour before or 2 hours after a meal. ° °Patient knows to avoid grapefruit and grapefruit juice while on Tarceva. ° °Tarceva start date: 06/28/21 ° °Adverse effects include but are not limited to: rash, diarrhea, nausea, vomiting, decreased appetitie, fatigue, dyspnea, and ocular disorders.  ° °Patient will obtain anti diarrheal and alert the office of 4 or more loose stools above baseline. ° °Reviewed with patient importance of keeping a medication schedule and plan for any missed doses. No barriers to medication adherence identified. ° °Medication reconciliation performed and medication/allergy list updated. ° °Insurance authorization for Tarceva has been obtained. Patient will pick this up from the [facility] outpatient pharmacy on 06/27/21.  ° °Patient informed the pharmacy will reach out 5-7 days prior to needing next fill of Tarceva to coordinate continued medication acquisition to prevent break in therapy. ° °All questions answered. ° °Vincent Velez voiced understanding and appreciation.  ° °Medication education handout placed in mail for patient. Patient knows to call the office with questions or concerns. Oral Chemotherapy Clinic phone number provided to patient.  ° °Rebecca Fanning, PharmD, BCPS °Hematology/Oncology Clinical Pharmacist °[facility] °[phone] °06/27/2021 11:18 AM ° °

## 2021-06-27 NOTE — Telephone Encounter (Signed)
Oral Chemotherapy Pharmacist Encounter   Attempted to reach patient to provide update and offer for initial counseling on oral medication: Tarceva (erlotinib).   No answer. Left voicemail for patient to call back to discuss details of medication acquisition and initial counseling session.  Leron Croak, PharmD, BCPS Hematology/Oncology Clinical Pharmacist [facility] [phone] 06/27/2021 9:52 AM

## 2021-06-30 DIAGNOSIS — E7800 Pure hypercholesterolemia, unspecified: Secondary | ICD-10-CM | POA: Diagnosis not present

## 2021-06-30 DIAGNOSIS — I480 Paroxysmal atrial fibrillation: Secondary | ICD-10-CM | POA: Diagnosis not present

## 2021-06-30 DIAGNOSIS — E1169 Type 2 diabetes mellitus with other specified complication: Secondary | ICD-10-CM | POA: Diagnosis not present

## 2021-06-30 DIAGNOSIS — E119 Type 2 diabetes mellitus without complications: Secondary | ICD-10-CM | POA: Diagnosis not present

## 2021-06-30 DIAGNOSIS — I10 Essential (primary) hypertension: Secondary | ICD-10-CM | POA: Diagnosis not present

## 2021-06-30 DIAGNOSIS — K219 Gastro-esophageal reflux disease without esophagitis: Secondary | ICD-10-CM | POA: Diagnosis not present

## 2021-07-02 ENCOUNTER — Ambulatory Visit: Payer: Medicare Other

## 2021-07-02 ENCOUNTER — Other Ambulatory Visit: Payer: Medicare Other

## 2021-07-02 ENCOUNTER — Ambulatory Visit: Payer: Medicare Other | Admitting: Internal Medicine

## 2021-07-03 ENCOUNTER — Other Ambulatory Visit: Payer: Medicare Other

## 2021-07-03 ENCOUNTER — Telehealth: Payer: Self-pay | Admitting: Internal Medicine

## 2021-07-03 ENCOUNTER — Ambulatory Visit: Payer: Medicare Other | Admitting: Internal Medicine

## 2021-07-03 ENCOUNTER — Ambulatory Visit: Payer: Medicare Other

## 2021-07-03 NOTE — Telephone Encounter (Signed)
Scheduled per 01/12 los, patient has been called and voicemail was left.

## 2021-07-08 ENCOUNTER — Other Ambulatory Visit: Payer: Self-pay

## 2021-07-08 DIAGNOSIS — I714 Abdominal aortic aneurysm, without rupture, unspecified: Secondary | ICD-10-CM

## 2021-07-09 ENCOUNTER — Other Ambulatory Visit: Payer: Self-pay

## 2021-07-09 ENCOUNTER — Inpatient Hospital Stay (HOSPITAL_BASED_OUTPATIENT_CLINIC_OR_DEPARTMENT_OTHER): Payer: Medicare Other | Admitting: Internal Medicine

## 2021-07-09 ENCOUNTER — Inpatient Hospital Stay: Payer: Medicare Other | Attending: Physician Assistant

## 2021-07-09 VITALS — BP 125/77 | HR 61 | Temp 98.2°F | Resp 16 | Wt 162.6 lb

## 2021-07-09 DIAGNOSIS — C3490 Malignant neoplasm of unspecified part of unspecified bronchus or lung: Secondary | ICD-10-CM | POA: Diagnosis not present

## 2021-07-09 DIAGNOSIS — F17200 Nicotine dependence, unspecified, uncomplicated: Secondary | ICD-10-CM | POA: Insufficient documentation

## 2021-07-09 DIAGNOSIS — C782 Secondary malignant neoplasm of pleura: Secondary | ICD-10-CM | POA: Insufficient documentation

## 2021-07-09 DIAGNOSIS — J90 Pleural effusion, not elsewhere classified: Secondary | ICD-10-CM | POA: Diagnosis not present

## 2021-07-09 DIAGNOSIS — F419 Anxiety disorder, unspecified: Secondary | ICD-10-CM | POA: Insufficient documentation

## 2021-07-09 DIAGNOSIS — D696 Thrombocytopenia, unspecified: Secondary | ICD-10-CM | POA: Insufficient documentation

## 2021-07-09 DIAGNOSIS — Z7901 Long term (current) use of anticoagulants: Secondary | ICD-10-CM | POA: Insufficient documentation

## 2021-07-09 DIAGNOSIS — R197 Diarrhea, unspecified: Secondary | ICD-10-CM | POA: Diagnosis not present

## 2021-07-09 DIAGNOSIS — I700 Atherosclerosis of aorta: Secondary | ICD-10-CM | POA: Insufficient documentation

## 2021-07-09 DIAGNOSIS — Z800 Family history of malignant neoplasm of digestive organs: Secondary | ICD-10-CM | POA: Insufficient documentation

## 2021-07-09 DIAGNOSIS — H409 Unspecified glaucoma: Secondary | ICD-10-CM | POA: Insufficient documentation

## 2021-07-09 DIAGNOSIS — R5383 Other fatigue: Secondary | ICD-10-CM | POA: Diagnosis not present

## 2021-07-09 DIAGNOSIS — Z923 Personal history of irradiation: Secondary | ICD-10-CM | POA: Insufficient documentation

## 2021-07-09 DIAGNOSIS — I359 Nonrheumatic aortic valve disorder, unspecified: Secondary | ICD-10-CM | POA: Insufficient documentation

## 2021-07-09 DIAGNOSIS — R0600 Dyspnea, unspecified: Secondary | ICD-10-CM | POA: Insufficient documentation

## 2021-07-09 DIAGNOSIS — Z79899 Other long term (current) drug therapy: Secondary | ICD-10-CM | POA: Diagnosis not present

## 2021-07-09 DIAGNOSIS — I4891 Unspecified atrial fibrillation: Secondary | ICD-10-CM | POA: Diagnosis not present

## 2021-07-09 DIAGNOSIS — Z85118 Personal history of other malignant neoplasm of bronchus and lung: Secondary | ICD-10-CM | POA: Insufficient documentation

## 2021-07-09 DIAGNOSIS — R590 Localized enlarged lymph nodes: Secondary | ICD-10-CM | POA: Insufficient documentation

## 2021-07-09 DIAGNOSIS — Z8546 Personal history of malignant neoplasm of prostate: Secondary | ICD-10-CM | POA: Diagnosis not present

## 2021-07-09 DIAGNOSIS — Z952 Presence of prosthetic heart valve: Secondary | ICD-10-CM | POA: Insufficient documentation

## 2021-07-09 DIAGNOSIS — Z5111 Encounter for antineoplastic chemotherapy: Secondary | ICD-10-CM | POA: Diagnosis present

## 2021-07-09 DIAGNOSIS — E7800 Pure hypercholesterolemia, unspecified: Secondary | ICD-10-CM | POA: Insufficient documentation

## 2021-07-09 DIAGNOSIS — C3412 Malignant neoplasm of upper lobe, left bronchus or lung: Secondary | ICD-10-CM | POA: Insufficient documentation

## 2021-07-09 DIAGNOSIS — I499 Cardiac arrhythmia, unspecified: Secondary | ICD-10-CM | POA: Insufficient documentation

## 2021-07-09 DIAGNOSIS — R1013 Epigastric pain: Secondary | ICD-10-CM | POA: Diagnosis not present

## 2021-07-09 DIAGNOSIS — K219 Gastro-esophageal reflux disease without esophagitis: Secondary | ICD-10-CM | POA: Diagnosis not present

## 2021-07-09 DIAGNOSIS — Z5112 Encounter for antineoplastic immunotherapy: Secondary | ICD-10-CM | POA: Diagnosis present

## 2021-07-09 DIAGNOSIS — I714 Abdominal aortic aneurysm, without rupture, unspecified: Secondary | ICD-10-CM | POA: Insufficient documentation

## 2021-07-09 LAB — CBC WITH DIFFERENTIAL (CANCER CENTER ONLY)
Abs Immature Granulocytes: 0.05 10*3/uL (ref 0.00–0.07)
Basophils Absolute: 0.1 10*3/uL (ref 0.0–0.1)
Basophils Relative: 1 %
Eosinophils Absolute: 1.2 10*3/uL — ABNORMAL HIGH (ref 0.0–0.5)
Eosinophils Relative: 12 %
HCT: 41.1 % (ref 39.0–52.0)
Hemoglobin: 13.4 g/dL (ref 13.0–17.0)
Immature Granulocytes: 1 %
Lymphocytes Relative: 14 %
Lymphs Abs: 1.4 10*3/uL (ref 0.7–4.0)
MCH: 29.8 pg (ref 26.0–34.0)
MCHC: 32.6 g/dL (ref 30.0–36.0)
MCV: 91.3 fL (ref 80.0–100.0)
Monocytes Absolute: 0.9 10*3/uL (ref 0.1–1.0)
Monocytes Relative: 9 %
Neutro Abs: 6.6 10*3/uL (ref 1.7–7.7)
Neutrophils Relative %: 63 %
Platelet Count: 161 10*3/uL (ref 150–400)
RBC: 4.5 MIL/uL (ref 4.22–5.81)
RDW: 17 % — ABNORMAL HIGH (ref 11.5–15.5)
WBC Count: 10.3 10*3/uL (ref 4.0–10.5)
nRBC: 0 % (ref 0.0–0.2)

## 2021-07-09 LAB — CMP (CANCER CENTER ONLY)
ALT: 15 U/L (ref 0–44)
AST: 21 U/L (ref 15–41)
Albumin: 4.1 g/dL (ref 3.5–5.0)
Alkaline Phosphatase: 121 U/L (ref 38–126)
Anion gap: 10 (ref 5–15)
BUN: 24 mg/dL — ABNORMAL HIGH (ref 8–23)
CO2: 22 mmol/L (ref 22–32)
Calcium: 10 mg/dL (ref 8.9–10.3)
Chloride: 104 mmol/L (ref 98–111)
Creatinine: 1.04 mg/dL (ref 0.61–1.24)
GFR, Estimated: >60 mL/min (ref >60)
Glucose, Bld: 229 mg/dL — ABNORMAL HIGH (ref 70–99)
Potassium: 3.8 mmol/L (ref 3.5–5.1)
Sodium: 136 mmol/L (ref 135–145)
Total Bilirubin: 1 mg/dL (ref 0.3–1.2)
Total Protein: 7.7 g/dL (ref 6.5–8.1)

## 2021-07-09 LAB — TSH: TSH: 1.681 u[IU]/mL (ref 0.320–4.118)

## 2021-07-09 NOTE — Progress Notes (Signed)
[facility] Telephone:([phone]   Fax:([phone]) [phone]  OFFICE PROGRESS NOTE  [name], MD [address] [place] [address]  DIAGNOSIS: Recurrent/metastatic non-small cell lung cancer, adenocarcinoma initially diagnosed as a stage Ib (T2 a, N0, M0) adenocarcinoma in September 2020 status post wedge resection of the left upper lobe nodule.  The patient has disease recurrence and September 2022 with metastatic mediastinal lymphadenopathy as well as left hilar and left pleural metastatic disease   PD-L1 expression was negative.   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  First dose April 30, 2021. Status post 2 cycles. Discontinued due to molecular studies   CURRENT THERAPY: Tarceva 150 mg p.o. daily started June 28, 2021.  INTERVAL HISTORY: Vincent Velez 81 y.o. male returns to the clinic today for follow-up visit accompanied by his daughter.  The patient is feeling fine today with no concerning complaints except for the acid reflux that has been getting worse especially after discontinuing his PPI.  He has been using Tums and Rolaids with minimal improvement.  He has 1 episode of diarrhea after restarting his treatment.  He has no current nausea, vomiting, abdominal pain or constipation.  He has no chest pain, shortness of breath, cough or hemoptysis.  He has no headache or visual changes.  He has been tolerating his treatment with Tarceva fairly well.  He had repeat CT scan of the chest before starting the first dose of this treatment and he is here for evaluation and discussion of his scan  results.   MEDICAL HISTORY: Past Medical History:  Diagnosis Date   AAA (abdominal aortic aneurysm)    Anxiety    Arthritis    Asthma    when younger   Atrial fibrillation (HCC)    CAD (coronary artery disease)    Cataract    removed both   COPD (chronic obstructive pulmonary disease) (HCC)    Diabetes mellitus    Type II   Dysrhythmia    afib   Esophageal reflux    Family history of malignant neoplasm of gastrointestinal tract    Heart murmur    Hiatal hernia    Hyperlipemia    Hypertension    Lung nodule    a. PET scan highly suspicious for malignancy. Bronch with biopsy to be done after TAVR   Malignant neoplasm of prostate [facility])    prostate    Peripheral vascular disease ([place])    Pneumonia    Stricture and stenosis of esophagus     ALLERGIES:  is allergic to macrolides and ketolides.  MEDICATIONS:  Current Outpatient Medications  Medication Sig Dispense Refill   ACCU-CHEK GUIDE test strip USE TO CHECK YOUR BLOOD SUGAR ONCE DAILY     acetaminophen (TYLENOL) 500 MG tablet Take 2 tablets (1,000 mg total) by mouth every 6 (six) hours as needed for mild pain or fever. 30 tablet 0   diltiazem (CARDIZEM SR) 60 MG 12 hr capsule Take 1 capsule (60 mg total) by mouth every 12 (twelve) hours. 60 capsule 7   dorzolamide-timolol (COSOPT) 22.3-6.8 MG/ML ophthalmic solution Place 1 drop into both eyes in  the morning, at noon, and at bedtime.     erlotinib (TARCEVA) 150 MG tablet Take 1 tablet (150 mg total) by mouth daily. Take on an empty stomach 1 hour before meals or 2 hours after. 30 tablet 3   folic acid (FOLVITE) 1 MG tablet Take 1 tablet (1 mg total) by mouth daily. 30 tablet 4   glimepiride (AMARYL) 2 MG tablet Take 2 mg by mouth daily with breakfast.     JARDIANCE 25 MG TABS tablet Take 25 mg by mouth daily.     metFORMIN (GLUCOPHAGE-XR) 500 MG 24 hr tablet Take 1,000 mg by mouth every evening.      metoprolol succinate (TOPROL-XL) 50 MG 24 hr tablet Take 50 mg by mouth  daily.     Multiple Vitamins-Minerals (MULTIVITAMIN WITH MINERALS) tablet Take 1 tablet by mouth daily.     Multiple Vitamins-Minerals (ZINC PO) Take 1 tablet by mouth daily.     prochlorperazine (COMPAZINE) 10 MG tablet Take 1 tablet (10 mg total) by mouth every 6 (six) hours as needed for nausea or vomiting. 30 tablet 0   simvastatin (ZOCOR) 40 MG tablet Take 40 mg by mouth at bedtime.     XARELTO 20 MG TABS tablet TAKE 1 TABLET BY MOUTH  DAILY WITH SUPPER 90 tablet 1   No current facility-administered medications for this visit.    SURGICAL HISTORY:  Past Surgical History:  Procedure Laterality Date   ABDOMINAL AORTA STENT     CARDIAC CATHETERIZATION     CARDIOVERSION N/A 12/04/2015   Procedure: CARDIOVERSION;  Surgeon: Sanda Klein, MD;  Location: [facility];  Service: Cardiovascular;  Laterality: N/A;   COLONOSCOPY     FINGER SURGERY Right    middle finger- amputation   INSERTION PROSTATE RADIATION SEED     MEDIASTINOSCOPY N/A 01/06/2019   Procedure: MEDIASTINOSCOPY WITH BIOPIES;  Surgeon: Gaye Pollack, MD;  Location: [facility];  Service: Thoracic;  Laterality: N/A;   RIGHT/LEFT HEART CATH AND CORONARY ANGIOGRAPHY N/A 10/26/2018   Procedure: RIGHT/LEFT HEART CATH AND CORONARY ANGIOGRAPHY;  Surgeon: Burnell Blanks, MD;  Location: [facility];  Service: Cardiovascular;  Laterality: N/A;   TEE WITHOUT CARDIOVERSION N/A 12/06/2018   Procedure: TRANSESOPHAGEAL ECHOCARDIOGRAM (TEE);  Surgeon: Burnell Blanks, MD;  Location: [facility];  Service: Open Heart Surgery;  Laterality: N/A;   TRANSCATHETER AORTIC VALVE REPLACEMENT, TRANSFEMORAL N/A 12/06/2018   Procedure: TRANSCATHETER AORTIC VALVE REPLACEMENT, TRANSFEMORAL;  Surgeon: Burnell Blanks, MD;  Location: [facility];  Service: Open Heart Surgery;  Laterality: N/A;   UPPER GASTROINTESTINAL ENDOSCOPY     VIDEO ASSISTED THORACOSCOPY (VATS)/THOROCOTOMY Left 02/02/2019   Procedure: VIDEO ASSISTED  THORACOSCOPY (VATS)/THOROCOTOMY;  Surgeon: Gaye Pollack, MD;  Location: [facility];  Service: Thoracic;  Laterality: Left;   VIDEO BRONCHOSCOPY N/A 01/06/2019   Procedure: VIDEO BRONCHOSCOPY;  Surgeon: Gaye Pollack, MD;  Location: [facility];  Service: Thoracic;  Laterality: N/A;   VIDEO BRONCHOSCOPY WITH ENDOBRONCHIAL ULTRASOUND N/A 03/07/2021   Procedure: VIDEO BRONCHOSCOPY WITH ENDOBRONCHIAL ULTRASOUND;  Surgeon: [name], MD;  Location: [facility];  Service: Thoracic;  Laterality: N/A;   WEDGE RESECTION Left 02/02/2019   Procedure: LUNG RESECTION;  Surgeon: Gaye Pollack, MD;  Location: [facility];  Service: Thoracic;  Laterality: Left;    REVIEW OF SYSTEMS:  Constitutional: positive for fatigue Eyes: negative Ears, nose, mouth, throat, and face: negative Respiratory: negative Cardiovascular: negative Gastrointestinal: positive for dyspepsia and reflux symptoms Genitourinary:negative Integument/breast:  negative Hematologic/lymphatic: negative Musculoskeletal:negative Neurological: negative Behavioral/Psych: negative Endocrine: negative Allergic/Immunologic: negative   PHYSICAL EXAMINATION: General appearance: alert, cooperative, fatigued, and no distress Head: Normocephalic, without obvious abnormality, atraumatic Neck: no adenopathy, no JVD, supple, symmetrical, trachea midline, and thyroid not enlarged, symmetric, no tenderness/mass/nodules Lymph nodes: Cervical, supraclavicular, and axillary nodes normal. Resp: clear to auscultation bilaterally Back: symmetric, no curvature. ROM normal. No CVA tenderness. Cardio: regular rate and rhythm, S1, S2 normal, no murmur, click, rub or gallop GI: soft, non-tender; bowel sounds normal; no masses,  no organomegaly Extremities: extremities normal, atraumatic, no cyanosis or edema Neurologic: Alert and oriented X 3, normal strength and tone. Normal symmetric reflexes. Normal coordination and gait  ECOG PERFORMANCE STATUS: 1 - Symptomatic but  completely ambulatory  Blood pressure 125/77, pulse 61, temperature 98.2 F (36.8 C), temperature source Tympanic, resp. rate 16, weight 162 lb 9.6 oz (73.8 kg), SpO2 98 %.  LABORATORY DATA: Lab Results  Component Value Date   WBC 5.1 06/12/2021   HGB 11.3 (L) 06/12/2021   HCT 34.3 (L) 06/12/2021   MCV 88.9 06/12/2021   PLT 171 06/12/2021      Chemistry      Component Value Date/Time   NA 136 06/12/2021 0835   NA 138 12/06/2019 1438   K 4.0 06/12/2021 0835   CL 106 06/12/2021 0835   CO2 21 (L) 06/12/2021 0835   BUN 18 06/12/2021 0835   BUN 14 12/06/2019 1438   CREATININE 0.82 06/12/2021 0835   CREATININE 0.81 11/29/2015 1335      Component Value Date/Time   CALCIUM 9.4 06/12/2021 0835   ALKPHOS 94 06/12/2021 0835   AST 20 06/12/2021 0835   ALT 13 06/12/2021 0835   BILITOT 0.5 06/12/2021 0835       RADIOGRAPHIC STUDIES: CT Chest W Contrast  Result Date: 06/27/2021 CLINICAL DATA:  81 year old male with history of non-small cell lung cancer diagnosed in 2019 with recurrence in 2022. Follow-up study. EXAM: CT CHEST WITH CONTRAST TECHNIQUE: Multidetector CT imaging of the chest was performed during intravenous contrast administration. RADIATION DOSE REDUCTION: This exam was performed according to the departmental dose-optimization program which includes automated exposure control, adjustment of the mA and/or kV according to patient size and/or use of iterative reconstruction technique. CONTRAST:  18m OMNIPAQUE IOHEXOL 300 MG/ML  SOLN COMPARISON:  Chest CT 02/12/2021. FINDINGS: Cardiovascular: Heart size is mildly enlarged. There is no significant pericardial fluid, thickening or pericardial calcification. There is aortic atherosclerosis, as well as atherosclerosis of the great vessels of the mediastinum and the coronary arteries, including calcified atherosclerotic plaque in the left main, left anterior descending, left circumflex and right coronary arteries. Status post TAVR.  Mediastinum/Nodes: Multiple prominent borderline enlarged and mildly enlarged mediastinal and bilateral hilar lymph nodes, largest of which measures up to 1.7 cm in short axis in the right hilar nodal station (axial image 62 of series 2) and 1.5 cm in short axis in the prevascular nodal station (axial image 61 of series 2). Esophagus is unremarkable in appearance. No axillary lymphadenopathy. Lungs/Pleura: Increasing nodular and mass-like thickening of the pleural surfaces throughout the left hemithorax, compatible with progressive metastatic disease to the pleura. This is most evident adjacent to the suture line in the inferior aspect of the lingula. No new suspicious appearing pulmonary nodules or masses are noted in the right lung. No acute consolidative airspace disease. Trace left pleural effusion. No right pleural effusion. Widespread peripheral predominant areas of ground-glass attenuation, septal thickening and subpleural reticulation are noted  throughout the lungs bilaterally with no discernible craniocaudal gradient, progressive compared to prior examinations. Upper Abdomen: Aortic atherosclerosis. 1.8 cm left adrenal nodule, stable, previously characterized as an adenoma. Musculoskeletal: There are no aggressive appearing lytic or blastic lesions noted in the visualized portions of the skeleton. IMPRESSION: 1. Progressive metastatic disease throughout the pleura of the left hemithorax with new right hilar lymphadenopathy and increasing prevascular lymphadenopathy. 2. The appearance of the lung parenchyma suggests progressive interstitial lung disease, at this time, categorized as most compatible with an alternative diagnosis (not usual interstitial pneumonia) per current ATS guidelines. Outpatient referral to Pulmonology for further clinical evaluation is suggested. 3. Aortic atherosclerosis, in addition to left main and three-vessel coronary artery disease. 4. Mild cardiomegaly. 5. Stable left adrenal  nodule, previously characterized as a benign adenoma. Aortic Atherosclerosis (ICD10-I70.0). Electronically Signed   By: Vinnie Langton M.D.   On: 06/27/2021 09:22    ASSESSMENT AND PLAN:  This is a very pleasant 81 years old white male recently diagnosed with recurrent/metastatic non-small cell lung cancer, adenocarcinoma that was initially diagnosed as stage Ib (T2 a, N0, M0) adenocarcinoma in September 2020 status post wedge resection of the left lower lobe lung nodule and he has recurrence in September 2022 presented with metastatic mediastinal lymphadenopathy in addition to left hilar and left pleural metastatic disease. The patient had molecular studies by foundation 1 but there was insufficient material for the molecular test.  His PD-L1 expression was negative.  I will ask the pathology department to send his original resected tumor in 2020 for molecular studies as it may have enough tissue for testing. The patient started systemic chemotherapy with carboplatin for AUC of 5, Alimta 500 Mg/M2 and Keytruda 200 Mg IV every 3 weeks on April 30, 2021.  Status post 2 cycles.  He had evidence for disease progression after the first 2 cycles of his treatment. He had repeat CT scan of the chest that showed progressive metastatic disease throughout the pleura of the left hemothorax with new right hilar lymphadenopathy and increasing prevascular lymphadenopathy.  His treatment with chemotherapy was discontinued. Molecular studies from his resected tumor in September 2020 showed positive EGFR mutation, exon 25. The patient started treatment with Tarceva 150 mg p.o. daily on June 28, 2021.  He was not a good candidate for treatment with Tagrisso because of prolonged QT interval and concern about arrhythmia with Tagrisso. The patient tolerated the first 2 weeks of his treatment fairly well with no concerning adverse effect except for the acid reflux and one episode of diarrhea. I recommended for the  patient to continue his current treatment with Tarceva with the same dose. Regarding the acid reflux I advised him to use Pepcid 2 hours after his morning dose of Tarceva and again in the evening 10 hours before his Tarceva. I will see the patient back for follow-up visit in around 3 weeks for evaluation and repeat blood work. He was advised to call immediately if he has any other concerning symptoms in the interval. The patient voices understanding of current disease status and treatment options and is in agreement with the current care plan.  All questions were answered. The patient knows to call the clinic with any problems, questions or concerns. We can certainly see the patient much sooner if necessary.  Disclaimer: This note was dictated with voice recognition software. Similar sounding words can inadvertently be transcribed and may not be corrected upon review.

## 2021-07-10 ENCOUNTER — Ambulatory Visit (INDEPENDENT_AMBULATORY_CARE_PROVIDER_SITE_OTHER): Payer: Medicare Other | Admitting: Physician Assistant

## 2021-07-10 ENCOUNTER — Other Ambulatory Visit: Payer: Medicare Other

## 2021-07-10 ENCOUNTER — Ambulatory Visit (HOSPITAL_COMMUNITY)
Admission: RE | Admit: 2021-07-10 | Discharge: 2021-07-10 | Disposition: A | Discharge status: Home / Self Care | Payer: Medicare Other | Source: Ambulatory Visit | Attending: Vascular Surgery | Admitting: Vascular Surgery

## 2021-07-10 VITALS — BP 114/76 | HR 109 | Temp 97.9°F | Resp 20 | Ht 69.0 in | Wt 163.5 lb

## 2021-07-10 DIAGNOSIS — I714 Abdominal aortic aneurysm, without rupture, unspecified: Secondary | ICD-10-CM | POA: Insufficient documentation

## 2021-07-10 DIAGNOSIS — I70212 Atherosclerosis of native arteries of extremities with intermittent claudication, left leg: Secondary | ICD-10-CM | POA: Diagnosis not present

## 2021-07-10 NOTE — Progress Notes (Signed)
HISTORY AND PHYSICAL     CC:  follow up. For EVAR Requesting Provider:  [name], MD  HPI: This is a 81 y.o. male who is here today for follow up for AAA and is s/p EVAR in  November 2010 by Dr. Oneida Alar.  Pt was last seen February 2022 and at that time, he was doing well without abdominal pain or claudication.  The pt returns today for follow up studies.  He states that he has stage 4 lung cancer.  He has hx of left VATS, left muscle sparing thoracotomy and wedge resection of LUL lung nodule in 2020 by Dr. Cyndia Bent.  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  He states he underwent chemo, but this was not successful and now he is taking pills.    He states that he has started getting some pain in the left calf with walking.  He states he thinks he could go about a city block before cramping.  He states he used to be able to walk much further.  He is not smoking.  He does not have non healing wounds or rest pain.   The pt is on a statin for cholesterol management.    The pt is not on an aspirin.    Other AC:  Xarelto The pt is on CCB, BB for hypertension.  The pt does have diabetes. Tobacco hx:  former   Past Medical History:  Diagnosis Date   AAA (abdominal aortic aneurysm)    Anxiety    Arthritis    Asthma    when younger   Atrial fibrillation (HCC)    CAD (coronary artery disease)    Cataract    removed both   COPD (chronic obstructive pulmonary disease) (HCC)    Diabetes mellitus    Type II   Dysrhythmia    afib   Esophageal reflux    Family history of malignant neoplasm of gastrointestinal tract    Heart murmur    Hiatal hernia    Hyperlipemia    Hypertension    Lung nodule    a. PET scan highly suspicious for malignancy. Bronch with biopsy to be done after TAVR   Malignant neoplasm of prostate ([place])    prostate     Peripheral vascular disease ([place])    Pneumonia    Stricture and stenosis of esophagus     Past Surgical History:  Procedure Laterality Date   ABDOMINAL AORTA STENT     CARDIAC CATHETERIZATION     CARDIOVERSION N/A 12/04/2015   Procedure: CARDIOVERSION;  Surgeon: Sanda Klein, MD;  Location: [facility];  Service: Cardiovascular;  Laterality: N/A;   COLONOSCOPY     FINGER SURGERY Right    middle finger- amputation   INSERTION PROSTATE RADIATION SEED     MEDIASTINOSCOPY N/A 01/06/2019   Procedure: MEDIASTINOSCOPY WITH BIOPIES;  Surgeon: Gaye Pollack, MD;  Location: [facility];  Service: Thoracic;  Laterality: N/A;   RIGHT/LEFT HEART CATH AND CORONARY ANGIOGRAPHY N/A 10/26/2018   Procedure: RIGHT/LEFT HEART CATH AND CORONARY ANGIOGRAPHY;  Surgeon: Burnell Blanks, MD;  Location: [facility];  Service: Cardiovascular;  Laterality: N/A;   TEE WITHOUT CARDIOVERSION N/A 12/06/2018   Procedure: TRANSESOPHAGEAL ECHOCARDIOGRAM (TEE);  Surgeon: Burnell Blanks, MD;  Location: [facility];  Service: Open Heart Surgery;  Laterality: N/A;   TRANSCATHETER AORTIC VALVE REPLACEMENT, TRANSFEMORAL N/A 12/06/2018  Procedure: TRANSCATHETER AORTIC VALVE REPLACEMENT, TRANSFEMORAL;  Surgeon: Burnell Blanks, MD;  Location: [facility];  Service: Open Heart Surgery;  Laterality: N/A;   UPPER GASTROINTESTINAL ENDOSCOPY     VIDEO ASSISTED THORACOSCOPY (VATS)/THOROCOTOMY Left 02/02/2019   Procedure: VIDEO ASSISTED THORACOSCOPY (VATS)/THOROCOTOMY;  Surgeon: Gaye Pollack, MD;  Location: [facility];  Service: Thoracic;  Laterality: Left;   VIDEO BRONCHOSCOPY N/A 01/06/2019   Procedure: VIDEO BRONCHOSCOPY;  Surgeon: Gaye Pollack, MD;  Location: [facility];  Service: Thoracic;  Laterality: N/A;   VIDEO BRONCHOSCOPY WITH ENDOBRONCHIAL ULTRASOUND N/A 03/07/2021   Procedure: VIDEO BRONCHOSCOPY WITH ENDOBRONCHIAL ULTRASOUND;  Surgeon: [name], MD;  Location: [facility];  Service:  Thoracic;  Laterality: N/A;   WEDGE RESECTION Left 02/02/2019   Procedure: LUNG RESECTION;  Surgeon: Gaye Pollack, MD;  Location: [facility];  Service: Thoracic;  Laterality: Left;    Allergies  Allergen Reactions   Macrolides And Ketolides Other (See Comments)    Pt unsure of this     Current Outpatient Medications  Medication Sig Dispense Refill   ACCU-CHEK GUIDE test strip USE TO CHECK YOUR BLOOD SUGAR ONCE DAILY     acetaminophen (TYLENOL) 500 MG tablet Take 2 tablets (1,000 mg total) by mouth every 6 (six) hours as needed for mild pain or fever. 30 tablet 0   diltiazem (CARDIZEM SR) 60 MG 12 hr capsule Take 1 capsule (60 mg total) by mouth every 12 (twelve) hours. 60 capsule 7   dorzolamide-timolol (COSOPT) 22.3-6.8 MG/ML ophthalmic solution Place 1 drop into both eyes in the morning, at noon, and at bedtime.     erlotinib (TARCEVA) 150 MG tablet Take 1 tablet (150 mg total) by mouth daily. Take on an empty stomach 1 hour before meals or 2 hours after. 30 tablet 3   folic acid (FOLVITE) 1 MG tablet Take 1 tablet (1 mg total) by mouth daily. 30 tablet 4   glimepiride (AMARYL) 2 MG tablet Take 2 mg by mouth daily with breakfast.     JARDIANCE 25 MG TABS tablet Take 25 mg by mouth daily.     metFORMIN (GLUCOPHAGE-XR) 500 MG 24 hr tablet Take 1,000 mg by mouth every evening.      metoprolol succinate (TOPROL-XL) 50 MG 24 hr tablet Take 50 mg by mouth daily.     Multiple Vitamins-Minerals (MULTIVITAMIN WITH MINERALS) tablet Take 1 tablet by mouth daily.     Multiple Vitamins-Minerals (ZINC PO) Take 1 tablet by mouth daily.     prochlorperazine (COMPAZINE) 10 MG tablet Take 1 tablet (10 mg total) by mouth every 6 (six) hours as needed for nausea or vomiting. 30 tablet 0   simvastatin (ZOCOR) 40 MG tablet Take 40 mg by mouth at bedtime.     XARELTO 20 MG TABS tablet TAKE 1 TABLET BY MOUTH  DAILY WITH SUPPER 90 tablet 1   No current facility-administered medications for this visit.    Family  History  Problem Relation Age of Onset   Colon cancer Father        questionable   Diabetes Mother    Heart disease Mother        Heart Disease before age 4   Deep vein thrombosis Mother    Hyperlipidemia Mother    Hypertension Mother    Varicose Veins Mother    Hypertension Brother    Heart attack Brother    Prostate cancer Brother    Colon polyps Neg Hx    Esophageal  cancer Neg Hx    Rectal cancer Neg Hx    Stomach cancer Neg Hx     Social History   Socioeconomic History   Marital status: Widowed    Spouse name: Not on file   Number of children: 2   Years of education: Not on file   Highest education level: Not on file  Occupational History   Occupation: engineer-retired    Employer: [employer]  Tobacco Use   Smoking status: Former    Types: Pipe, Cigars    Quit date: 12/2018    Years since quitting: 2.5   Smokeless tobacco: Never  Vaping Use   Vaping Use: Never used  Substance and Sexual Activity   Alcohol use: Not Currently    Alcohol/week: 0.0 standard drinks    Comment: occ   Drug use: No   Sexual activity: Not on file  Other Topics Concern   Not on file  Social History Narrative   Not on file   Social Determinants of Health   Financial Resource Strain: Not on file  Food Insecurity: Not on file  Transportation Needs: Not on file  Physical Activity: Not on file  Stress: Not on file  Social Connections: Not on file  Intimate Partner Violence: Not on file     REVIEW OF SYSTEMS:   [X]  denotes positive finding, [ ]  denotes negative finding Cardiac  Comments:  Chest pain or chest pressure:    Shortness of breath upon exertion: x   Short of breath when lying flat:    Irregular heart rhythm: x afib      Vascular    Pain in calf, thigh, or hip brought on by ambulation: x   Pain in feet at night that wakes you up from your sleep:     Blood clot in your veins:    Leg swelling:         Pulmonary    Oxygen at home:    Productive cough:      Wheezing:         Neurologic    Sudden weakness in arms or legs:     Sudden numbness in arms or legs:     Sudden onset of difficulty speaking or slurred speech:    Temporary loss of vision in one eye:     Problems with dizziness:  x       Gastrointestinal    Blood in stool:     Vomited blood:         Genitourinary    Burning when urinating:     Blood in urine:        Psychiatric    Major depression:         Hematologic    Bleeding problems:    Problems with blood clotting too easily:        Skin    Rashes or ulcers:        Constitutional    Fever or chills:      PHYSICAL EXAMINATION:  Today's Vitals   07/10/21 0919  BP: 114/76  Pulse: (!) 109  Resp: 20  Temp: 97.9 F (36.6 C)  TempSrc: Temporal  SpO2: 97%  Weight: 163 lb 8 oz (74.2 kg)  Height: 5\' 9"  (1.753 m)  PainSc: 6   PainLoc: Leg   Body mass index is 24.14 kg/m.   General:  WDWN in NAD; vital signs documented above Gait: Not observed HENT: WNL, normocephalic Pulmonary: normal non-labored breathing , without wheezing Cardiac: irregular HR,  without  Murmur; without carotid bruits Abdomen: soft, NT, no masses; aortic pulse is not palpable Skin: without rashes Vascular Exam/Pulses:  Right Left  Radial 2+ (normal) 2+ (normal)  Femoral 2+ (normal) 1+ (weak)  DP 2+ (normal) AT is monophasic  PT Unable to palpate absent  Peroneal Not evaluated monophasic   Extremities: without ischemic changes, without Gangrene , without cellulitis; without open wounds;  Musculoskeletal: no muscle wasting or atrophy  Neurologic: A&O X 3;  No focal weakness or paresthesias are detected Psychiatric:  The pt has Normal affect.   Non-Invasive Vascular Imaging:   EVAR Arterial duplex on 07/10/2021: Abdominal Aorta Findings:  +--------+-------+----------+----------+--------+--------+--------+   Location AP (cm) Trans (cm) PSV (cm/s) Waveform Thrombus Comments    +--------+-------+----------+----------+--------+--------+--------+   Proximal 2.73    2.57       174                                     +--------+-------+----------+----------+--------+--------+--------+     Endovascular Aortic Repair (EVAR):  +----------+----------------+-------------------+-------------------+              Diameter AP (cm) Diameter Trans (cm) Velocities (cm/sec)   +----------+----------------+-------------------+-------------------+   Aorta      4.35             4.54                57                    +----------+----------------+-------------------+-------------------+   Right Limb 1.28             1.35                67                    +----------+----------------+-------------------+-------------------+   Left Limb  1.55             1.51                81                    +----------+----------------+-------------------+-------------------+   +-------------+-----------+   Endoleak Type No endoleak   +-------------+-----------+   Summary:  Abdominal Aorta: Patent endovascular aneurysm repair with no evidence of endoleak.    ASSESSMENT/PLAN:: 81 y.o. male here with hx of EVAR  November 20210 by Dr. Oneida Alar.  EVAR -duplex today is essentially unchanged from one year ago.  He does not have endoleak.   -pt will f/u in one year with duplex and see PA.  Short distance claudication -pt now with left leg claudication.  He does not have rest pain or non healing wounds.  He has a faintly palpable left femoral pulse and monophasic doppler signals left foot.  I have encouraged him to do a graduated walking program.  I will bring him back in 3 months with ABI and see MD to evaluate sx.  If his sx are worse, MD to determine if pt is candidate for arteriogram given stage 4 cancer.  Discussed with pt that if he develops any rest pain or non healing wounds, he will contact us sooner.   -continue statin -pt is on Xarelto for afib.   Leontine Locket, [facility] [phone]  Clinic MD:   Scot Dock

## 2021-07-14 DIAGNOSIS — I10 Essential (primary) hypertension: Secondary | ICD-10-CM | POA: Diagnosis not present

## 2021-07-14 DIAGNOSIS — Z7984 Long term (current) use of oral hypoglycemic drugs: Secondary | ICD-10-CM | POA: Diagnosis not present

## 2021-07-14 DIAGNOSIS — Z952 Presence of prosthetic heart valve: Secondary | ICD-10-CM | POA: Diagnosis not present

## 2021-07-14 DIAGNOSIS — C3492 Malignant neoplasm of unspecified part of left bronchus or lung: Secondary | ICD-10-CM | POA: Diagnosis not present

## 2021-07-14 DIAGNOSIS — I480 Paroxysmal atrial fibrillation: Secondary | ICD-10-CM | POA: Diagnosis not present

## 2021-07-14 DIAGNOSIS — E7800 Pure hypercholesterolemia, unspecified: Secondary | ICD-10-CM | POA: Diagnosis not present

## 2021-07-14 DIAGNOSIS — E1169 Type 2 diabetes mellitus with other specified complication: Secondary | ICD-10-CM | POA: Diagnosis not present

## 2021-07-16 ENCOUNTER — Telehealth: Payer: Self-pay

## 2021-07-16 ENCOUNTER — Other Ambulatory Visit (HOSPITAL_COMMUNITY): Payer: Self-pay

## 2021-07-16 NOTE — Telephone Encounter (Signed)
Discussed with Cassandra, PA-C who confirmed a rash is a possible side effect from rx Tarceva and advised pt can use hydrocortisone cream and take Benadryl. Pt was advised of this and expressed understanding of this information.

## 2021-07-16 NOTE — Telephone Encounter (Signed)
Pt LM stating since starting Tarceva, he has developed a rash on his head.

## 2021-07-17 ENCOUNTER — Other Ambulatory Visit: Payer: Medicare Other

## 2021-07-18 ENCOUNTER — Other Ambulatory Visit: Payer: Self-pay | Admitting: *Deleted

## 2021-07-18 DIAGNOSIS — I70212 Atherosclerosis of native arteries of extremities with intermittent claudication, left leg: Secondary | ICD-10-CM

## 2021-07-24 ENCOUNTER — Ambulatory Visit: Payer: Medicare Other | Admitting: Physician Assistant

## 2021-07-24 ENCOUNTER — Other Ambulatory Visit (HOSPITAL_COMMUNITY): Payer: Self-pay

## 2021-07-24 ENCOUNTER — Ambulatory Visit: Payer: Medicare Other

## 2021-07-24 ENCOUNTER — Other Ambulatory Visit: Payer: Medicare Other

## 2021-07-25 ENCOUNTER — Telehealth: Payer: Self-pay

## 2021-07-25 NOTE — Telephone Encounter (Signed)
Called pt's pharmacy to confirm he had refill left on Folic acid. Returned patient's call to advise it would be ready for pick-up this afternoon.

## 2021-07-28 ENCOUNTER — Other Ambulatory Visit (HOSPITAL_COMMUNITY): Payer: Self-pay

## 2021-07-31 ENCOUNTER — Inpatient Hospital Stay (HOSPITAL_BASED_OUTPATIENT_CLINIC_OR_DEPARTMENT_OTHER): Payer: Medicare Other | Admitting: Internal Medicine

## 2021-07-31 ENCOUNTER — Inpatient Hospital Stay: Payer: Medicare Other | Attending: Physician Assistant

## 2021-07-31 ENCOUNTER — Other Ambulatory Visit: Payer: Medicare Other

## 2021-07-31 ENCOUNTER — Other Ambulatory Visit: Payer: Self-pay

## 2021-07-31 VITALS — BP 126/77 | HR 77 | Temp 97.1°F | Resp 18 | Wt 160.1 lb

## 2021-07-31 DIAGNOSIS — R0609 Other forms of dyspnea: Secondary | ICD-10-CM | POA: Insufficient documentation

## 2021-07-31 DIAGNOSIS — C782 Secondary malignant neoplasm of pleura: Secondary | ICD-10-CM | POA: Diagnosis not present

## 2021-07-31 DIAGNOSIS — Z79899 Other long term (current) drug therapy: Secondary | ICD-10-CM | POA: Diagnosis not present

## 2021-07-31 DIAGNOSIS — Z8639 Personal history of other endocrine, nutritional and metabolic disease: Secondary | ICD-10-CM | POA: Insufficient documentation

## 2021-07-31 DIAGNOSIS — Z8679 Personal history of other diseases of the circulatory system: Secondary | ICD-10-CM | POA: Insufficient documentation

## 2021-07-31 DIAGNOSIS — R21 Rash and other nonspecific skin eruption: Secondary | ICD-10-CM | POA: Diagnosis not present

## 2021-07-31 DIAGNOSIS — Z7901 Long term (current) use of anticoagulants: Secondary | ICD-10-CM | POA: Insufficient documentation

## 2021-07-31 DIAGNOSIS — C3490 Malignant neoplasm of unspecified part of unspecified bronchus or lung: Secondary | ICD-10-CM

## 2021-07-31 DIAGNOSIS — Z923 Personal history of irradiation: Secondary | ICD-10-CM | POA: Diagnosis not present

## 2021-07-31 DIAGNOSIS — Z5111 Encounter for antineoplastic chemotherapy: Secondary | ICD-10-CM

## 2021-07-31 DIAGNOSIS — Z800 Family history of malignant neoplasm of digestive organs: Secondary | ICD-10-CM | POA: Diagnosis not present

## 2021-07-31 DIAGNOSIS — R059 Cough, unspecified: Secondary | ICD-10-CM | POA: Insufficient documentation

## 2021-07-31 DIAGNOSIS — Z8546 Personal history of malignant neoplasm of prostate: Secondary | ICD-10-CM | POA: Diagnosis not present

## 2021-07-31 DIAGNOSIS — R5383 Other fatigue: Secondary | ICD-10-CM

## 2021-07-31 DIAGNOSIS — C3412 Malignant neoplasm of upper lobe, left bronchus or lung: Secondary | ICD-10-CM | POA: Insufficient documentation

## 2021-07-31 DIAGNOSIS — I4891 Unspecified atrial fibrillation: Secondary | ICD-10-CM | POA: Insufficient documentation

## 2021-07-31 DIAGNOSIS — Z5112 Encounter for antineoplastic immunotherapy: Secondary | ICD-10-CM | POA: Diagnosis present

## 2021-07-31 DIAGNOSIS — R197 Diarrhea, unspecified: Secondary | ICD-10-CM | POA: Insufficient documentation

## 2021-07-31 DIAGNOSIS — R590 Localized enlarged lymph nodes: Secondary | ICD-10-CM | POA: Diagnosis not present

## 2021-07-31 DIAGNOSIS — Z87891 Personal history of nicotine dependence: Secondary | ICD-10-CM | POA: Diagnosis not present

## 2021-07-31 DIAGNOSIS — C3492 Malignant neoplasm of unspecified part of left bronchus or lung: Secondary | ICD-10-CM

## 2021-07-31 LAB — CBC WITH DIFFERENTIAL (CANCER CENTER ONLY)
Abs Immature Granulocytes: 0.06 10*3/uL (ref 0.00–0.07)
Basophils Absolute: 0.1 10*3/uL (ref 0.0–0.1)
Basophils Relative: 1 %
Eosinophils Absolute: 0.7 10*3/uL — ABNORMAL HIGH (ref 0.0–0.5)
Eosinophils Relative: 7 %
HCT: 37.4 % — ABNORMAL LOW (ref 39.0–52.0)
Hemoglobin: 12.5 g/dL — ABNORMAL LOW (ref 13.0–17.0)
Immature Granulocytes: 1 %
Lymphocytes Relative: 11 %
Lymphs Abs: 1.1 10*3/uL (ref 0.7–4.0)
MCH: 29.9 pg (ref 26.0–34.0)
MCHC: 33.4 g/dL (ref 30.0–36.0)
MCV: 89.5 fL (ref 80.0–100.0)
Monocytes Absolute: 0.8 10*3/uL (ref 0.1–1.0)
Monocytes Relative: 8 %
Neutro Abs: 7.2 10*3/uL (ref 1.7–7.7)
Neutrophils Relative %: 72 %
Platelet Count: 152 10*3/uL (ref 150–400)
RBC: 4.18 MIL/uL — ABNORMAL LOW (ref 4.22–5.81)
RDW: 15.8 % — ABNORMAL HIGH (ref 11.5–15.5)
WBC Count: 9.9 10*3/uL (ref 4.0–10.5)
nRBC: 0 % (ref 0.0–0.2)

## 2021-07-31 LAB — CMP (CANCER CENTER ONLY)
ALT: 30 U/L (ref 0–44)
AST: 34 U/L (ref 15–41)
Albumin: 3.6 g/dL (ref 3.5–5.0)
Alkaline Phosphatase: 126 U/L (ref 38–126)
Anion gap: 8 (ref 5–15)
BUN: 22 mg/dL (ref 8–23)
CO2: 23 mmol/L (ref 22–32)
Calcium: 9.4 mg/dL (ref 8.9–10.3)
Chloride: 106 mmol/L (ref 98–111)
Creatinine: 0.88 mg/dL (ref 0.61–1.24)
GFR, Estimated: >60 mL/min (ref >60)
Glucose, Bld: 164 mg/dL — ABNORMAL HIGH (ref 70–99)
Potassium: 3.4 mmol/L — ABNORMAL LOW (ref 3.5–5.1)
Sodium: 137 mmol/L (ref 135–145)
Total Bilirubin: 0.6 mg/dL (ref 0.3–1.2)
Total Protein: 7.3 g/dL (ref 6.5–8.1)

## 2021-07-31 LAB — TSH: TSH: 0.855 u[IU]/mL (ref 0.320–4.118)

## 2021-07-31 NOTE — Progress Notes (Signed)
[place] [place] Telephone:([phone]) [phone]   Fax:(336) [phone]  OFFICE PROGRESS NOTE  [name], MD [address] [place] [address]  DIAGNOSIS: Recurrent/metastatic non-small cell lung cancer, adenocarcinoma initially diagnosed as a stage Ib (T2 a, N0, M0) adenocarcinoma in September 2020 status post wedge resection of the left upper lobe nodule.  The patient has disease recurrence and September 2022 with metastatic mediastinal lymphadenopathy as well as left hilar and left pleural metastatic disease   PD-L1 expression was negative.   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  First dose April 30, 2021. Status post 2 cycles. Discontinued due to molecular studies   CURRENT THERAPY: Tarceva 150 mg p.o. daily started June 28, 2021.  Status post 1 month of treatment  INTERVAL HISTORY: Vincent Velez 81 y.o. male returns to the clinic today for follow-up visit accompanied by his daughter.  The patient is feeling fine today with no concerning complaints except for skin rash especially on the upper extremities.  He has been applying hydrocortisone cream with no improvement.  He denied having any current chest pain but continues to have the baseline shortness of breath and mild cough with no hemoptysis.  He has no nausea, vomiting, but has few episodes diarrhea improved with Imodium.  He has no abdominal pain or constipation.  He has no headache or visual changes.  He is here today for evaluation and repeat blood work.    MEDICAL HISTORY: Past Medical History:  Diagnosis Date   AAA (abdominal aortic aneurysm)     Anxiety    Arthritis    Asthma    when younger   Atrial fibrillation (HCC)    CAD (coronary artery disease)    Cataract    removed both   COPD (chronic obstructive pulmonary disease) (HCC)    Diabetes mellitus    Type II   Dysrhythmia    afib   Esophageal reflux    Family history of malignant neoplasm of gastrointestinal tract    Heart murmur    Hiatal hernia    Hyperlipemia    Hypertension    Lung nodule    a. PET scan highly suspicious for malignancy. Bronch with biopsy to be done after TAVR   Malignant neoplasm of prostate [facility])    prostate    Peripheral vascular disease ([place])    Pneumonia    Stricture and stenosis of esophagus     ALLERGIES:  is allergic to macrolides and ketolides.  MEDICATIONS:  Current Outpatient Medications  Medication Sig Dispense Refill   ACCU-CHEK GUIDE test strip USE TO CHECK YOUR BLOOD SUGAR ONCE DAILY     acetaminophen (TYLENOL) 500 MG tablet Take 2 tablets (1,000 mg total) by mouth every 6 (six) hours as needed for mild pain or fever. 30 tablet 0   diltiazem (CARDIZEM SR) 60 MG 12 hr capsule Take 1 capsule (60 mg total) by mouth every 12 (twelve) hours. 60 capsule 7   dorzolamide-timolol (COSOPT) 22.3-6.8 MG/ML ophthalmic solution Place 1 drop into both eyes in the morning, at noon, and at bedtime.     erlotinib (TARCEVA) 150 MG tablet Take  1 tablet (150 mg total) by mouth daily. Take on an empty stomach 1 hour before meals or 2 hours after. 30 tablet 3   folic acid (FOLVITE) 1 MG tablet Take 1 tablet (1 mg total) by mouth daily. 30 tablet 4   glimepiride (AMARYL) 2 MG tablet Take 2 mg by mouth daily with breakfast.     JARDIANCE 25 MG TABS tablet Take 25 mg by mouth daily.     metFORMIN (GLUCOPHAGE-XR) 500 MG 24 hr tablet Take 1,000 mg by mouth every evening.      metoprolol succinate (TOPROL-XL) 50 MG 24 hr tablet Take 50 mg by mouth daily.     Multiple Vitamins-Minerals (MULTIVITAMIN WITH MINERALS) tablet Take 1 tablet by mouth daily.      Multiple Vitamins-Minerals (ZINC PO) Take 1 tablet by mouth daily.     prochlorperazine (COMPAZINE) 10 MG tablet Take 1 tablet (10 mg total) by mouth every 6 (six) hours as needed for nausea or vomiting. 30 tablet 0   simvastatin (ZOCOR) 40 MG tablet Take 40 mg by mouth at bedtime.     XARELTO 20 MG TABS tablet TAKE 1 TABLET BY MOUTH  DAILY WITH SUPPER 90 tablet 1   No current facility-administered medications for this visit.    SURGICAL HISTORY:  Past Surgical History:  Procedure Laterality Date   ABDOMINAL AORTA STENT     CARDIAC CATHETERIZATION     CARDIOVERSION N/A 12/04/2015   Procedure: CARDIOVERSION;  Surgeon: Sanda Klein, MD;  Location: [facility];  Service: Cardiovascular;  Laterality: N/A;   COLONOSCOPY     FINGER SURGERY Right    middle finger- amputation   INSERTION PROSTATE RADIATION SEED     MEDIASTINOSCOPY N/A 01/06/2019   Procedure: MEDIASTINOSCOPY WITH BIOPIES;  Surgeon: Gaye Pollack, MD;  Location: [facility];  Service: Thoracic;  Laterality: N/A;   RIGHT/LEFT HEART CATH AND CORONARY ANGIOGRAPHY N/A 10/26/2018   Procedure: RIGHT/LEFT HEART CATH AND CORONARY ANGIOGRAPHY;  Surgeon: Burnell Blanks, MD;  Location: [facility];  Service: Cardiovascular;  Laterality: N/A;   TEE WITHOUT CARDIOVERSION N/A 12/06/2018   Procedure: TRANSESOPHAGEAL ECHOCARDIOGRAM (TEE);  Surgeon: Burnell Blanks, MD;  Location: [facility];  Service: Open Heart Surgery;  Laterality: N/A;   TRANSCATHETER AORTIC VALVE REPLACEMENT, TRANSFEMORAL N/A 12/06/2018   Procedure: TRANSCATHETER AORTIC VALVE REPLACEMENT, TRANSFEMORAL;  Surgeon: Burnell Blanks, MD;  Location: [facility];  Service: Open Heart Surgery;  Laterality: N/A;   UPPER GASTROINTESTINAL ENDOSCOPY     VIDEO ASSISTED THORACOSCOPY (VATS)/THOROCOTOMY Left 02/02/2019   Procedure: VIDEO ASSISTED THORACOSCOPY (VATS)/THOROCOTOMY;  Surgeon: Gaye Pollack, MD;  Location: [facility];  Service: Thoracic;   Laterality: Left;   VIDEO BRONCHOSCOPY N/A 01/06/2019   Procedure: VIDEO BRONCHOSCOPY;  Surgeon: Gaye Pollack, MD;  Location: [facility];  Service: Thoracic;  Laterality: N/A;   VIDEO BRONCHOSCOPY WITH ENDOBRONCHIAL ULTRASOUND N/A 03/07/2021   Procedure: VIDEO BRONCHOSCOPY WITH ENDOBRONCHIAL ULTRASOUND;  Surgeon: [name], MD;  Location: [facility];  Service: Thoracic;  Laterality: N/A;   WEDGE RESECTION Left 02/02/2019   Procedure: LUNG RESECTION;  Surgeon: Gaye Pollack, MD;  Location: [facility];  Service: Thoracic;  Laterality: Left;    REVIEW OF SYSTEMS:  A comprehensive review of systems was negative except for: Respiratory: positive for cough and dyspnea on exertion Gastrointestinal: positive for diarrhea   PHYSICAL EXAMINATION: General appearance: alert, cooperative, and no distress Head: Normocephalic, without obvious abnormality, atraumatic Neck: no adenopathy, no JVD,  supple, symmetrical, trachea midline, and thyroid not enlarged, symmetric, no tenderness/mass/nodules Lymph nodes: Cervical, supraclavicular, and axillary nodes normal. Resp: clear to auscultation bilaterally Back: symmetric, no curvature. ROM normal. No CVA tenderness. Cardio: regular rate and rhythm, S1, S2 normal, no murmur, click, rub or gallop GI: soft, non-tender; bowel sounds normal; no masses,  no organomegaly Extremities: extremities normal, atraumatic, no cyanosis or edema  ECOG PERFORMANCE STATUS: 1 - Symptomatic but completely ambulatory  Blood pressure 126/77, pulse 77, temperature (!) 97.1 F (36.2 C), temperature source Tympanic, resp. rate 18, weight 160 lb 1 oz (72.6 kg), SpO2 95 %.  LABORATORY DATA: Lab Results  Component Value Date   WBC 9.9 07/31/2021   HGB 12.5 (L) 07/31/2021   HCT 37.4 (L) 07/31/2021   MCV 89.5 07/31/2021   PLT 152 07/31/2021      Chemistry      Component Value Date/Time   NA 136 07/09/2021 0813   NA 138 12/06/2019 1438   K 3.8 07/09/2021 0813   CL 104 07/09/2021  0813   CO2 22 07/09/2021 0813   BUN 24 (H) 07/09/2021 0813   BUN 14 12/06/2019 1438   CREATININE 1.04 07/09/2021 0813   CREATININE 0.81 11/29/2015 1335      Component Value Date/Time   CALCIUM 10.0 07/09/2021 0813   ALKPHOS 121 07/09/2021 0813   AST 21 07/09/2021 0813   ALT 15 07/09/2021 0813   BILITOT 1.0 07/09/2021 0813       RADIOGRAPHIC STUDIES: VAS [place] EVAR DUPLEX  Result Date: 07/10/2021 Endovascular Aortic Repair Study (EVAR) Patient Name:  [name]  Date of Exam:   07/10/2021 Medical Rec #: [id number]         Accession #:    [id number] Date of Birth: 06-19-1940         Patient Gender: M Patient Age:   39 years Exam Location:  [facility] Procedure:      VAS [place] EVAR DUPLEX Referring Phys: Harrell Gave DICKSON --------------------------------------------------------------------------------  Indications: Follow up exam for EVAR. Risk Factors: Hypertension, hyperlipidemia, past history of smoking, coronary               artery disease.  Performing Technologist: Delorise Shiner RVT  Examination Guidelines: A complete evaluation includes B-mode imaging, spectral Doppler, color Doppler, and power Doppler as needed of all accessible portions of each vessel. Bilateral testing is considered an integral part of a complete examination. Limited examinations for reoccurring indications may be performed as noted.  Abdominal Aorta Findings: +--------+-------+----------+----------+--------+--------+--------+  Location AP (cm) Trans (cm) PSV (cm/s) Waveform Thrombus Comments  +--------+-------+----------+----------+--------+--------+--------+  Proximal 2.73    2.57       174                                    +--------+-------+----------+----------+--------+--------+--------+ Endovascular Aortic Repair (EVAR): +----------+----------------+-------------------+-------------------+             Diameter AP (cm) Diameter Trans (cm) Velocities (cm/sec)   +----------+----------------+-------------------+-------------------+  Aorta      4.35             4.54                57                   +----------+----------------+-------------------+-------------------+  Right Limb 1.28             1.35  67                   +----------+----------------+-------------------+-------------------+  Left Limb  1.55             1.51                81                   +----------+----------------+-------------------+-------------------+ +-------------+-----------+  Endoleak Type No endoleak  +-------------+-----------+  Summary: Abdominal Aorta: Patent endovascular aneurysm repair with no evidence of endoleak.  *See table(s) above for measurements and observations.  Electronically signed by Deitra Mayo MD on 07/10/2021 at 9:51:32 AM.    Final     ASSESSMENT AND PLAN:  This is a very pleasant 81 years old white male recently diagnosed with recurrent/metastatic non-small cell lung cancer, adenocarcinoma that was initially diagnosed as stage Ib (T2 a, N0, M0) adenocarcinoma in September 2020 status post wedge resection of the left lower lobe lung nodule and he has recurrence in September 2022 presented with metastatic mediastinal lymphadenopathy in addition to left hilar and left pleural metastatic disease. The patient had molecular studies by foundation 1 but there was insufficient material for the molecular test.  His PD-L1 expression was negative.  I will ask the pathology department to send his original resected tumor in 2020 for molecular studies as it may have enough tissue for testing. The patient started systemic chemotherapy with carboplatin for AUC of 5, Alimta 500 Mg/M2 and Keytruda 200 Mg IV every 3 weeks on April 30, 2021.  Status post 2 cycles.  He had evidence for disease progression after the first 2 cycles of his treatment. He had repeat CT scan of the chest that showed progressive metastatic disease throughout the pleura of the left  hemothorax with new right hilar lymphadenopathy and increasing prevascular lymphadenopathy.  His treatment with chemotherapy was discontinued. Molecular studies from his resected tumor in September 2020 showed positive EGFR mutation, exon 79. The patient started treatment with Tarceva 150 mg p.o. daily on June 28, 2021.  He was not a good candidate for treatment with Tagrisso because of prolonged QT interval and concern about arrhythmia with Tagrisso. The patient has been tolerating his treatment with Tarceva fairly well except for the skin rash. I recommended for him to continue his current treatment with Tarceva with the same dose. I will see him back for follow-up visit in 1 months for evaluation with repeat CT scan of the chest, abdomen and pelvis for restaging of his disease. For the skin rash, I will start him on Medrol Dosepak and he will continue with the hydrocortisone cream on as-needed basis. Regarding the acid reflux I advised him to use Pepcid 2 hours after his morning dose of Tarceva and again in the evening 10 hours before his Tarceva. The patient was advised to call immediately if he has any other concerning symptoms in the interval. The patient voices understanding of current disease status and treatment options and is in agreement with the current care plan.  All questions were answered. The patient knows to call the clinic with any problems, questions or concerns. We can certainly see the patient much sooner if necessary.  Disclaimer: This note was dictated with voice recognition software. Similar sounding words can inadvertently be transcribed and may not be corrected upon review.

## 2021-08-01 ENCOUNTER — Other Ambulatory Visit: Payer: Self-pay

## 2021-08-01 DIAGNOSIS — C3492 Malignant neoplasm of unspecified part of left bronchus or lung: Secondary | ICD-10-CM

## 2021-08-01 MED ORDER — METHYLPREDNISOLONE 4 MG PO TBPK: ORAL_TABLET | ORAL | 0 refills | Status: DC

## 2021-08-03 ENCOUNTER — Encounter: Payer: Self-pay | Admitting: Internal Medicine

## 2021-08-04 ENCOUNTER — Telehealth: Payer: Self-pay | Admitting: Internal Medicine

## 2021-08-04 NOTE — Telephone Encounter (Signed)
Scheduled per 03/02 los, patient has been called and voicemail was left. ?

## 2021-08-07 ENCOUNTER — Other Ambulatory Visit: Payer: Medicare Other

## 2021-08-07 ENCOUNTER — Ambulatory Visit: Payer: Medicare Other | Admitting: Internal Medicine

## 2021-08-07 ENCOUNTER — Ambulatory Visit: Payer: Medicare Other

## 2021-08-14 ENCOUNTER — Other Ambulatory Visit: Payer: Self-pay | Admitting: *Deleted

## 2021-08-19 ENCOUNTER — Other Ambulatory Visit (HOSPITAL_COMMUNITY): Payer: Self-pay

## 2021-08-22 ENCOUNTER — Telehealth: Payer: Self-pay

## 2021-08-22 ENCOUNTER — Other Ambulatory Visit: Payer: Self-pay | Admitting: Physician Assistant

## 2021-08-22 DIAGNOSIS — R21 Rash and other nonspecific skin eruption: Secondary | ICD-10-CM

## 2021-08-22 DIAGNOSIS — C3492 Malignant neoplasm of unspecified part of left bronchus or lung: Secondary | ICD-10-CM

## 2021-08-22 MED ORDER — METHYLPREDNISOLONE 4 MG PO TBPK: ORAL_TABLET | ORAL | 0 refills | Status: DC

## 2021-08-22 NOTE — Telephone Encounter (Signed)
I have left a detailed message for the pt advising Dr. Julien Nordmann states to hold his Tarceva for now and a Medrol Dosepak has been sent to his pharmacy. ?

## 2021-08-22 NOTE — Telephone Encounter (Signed)
-----   Message from Britt Boozer, [facility] sent at 08/22/2021 10:52 AM EDT ----- ?Regarding: Pt with rash/dry swollen eyes ?Good Morning, ? ?Mr. Freiberger just called - he stated that he finished his steroids over a week ago (couldn't specify when) but that his rash has come back worse and that he's also experiencing dry and swollen eyes. Wanted this on your radar in case you would like to proceed with further supportive treatment. ? ?I transferred his call to POD 3 to discuss as well. ? ?Thanks, ?Wells Guiles ? ?

## 2021-08-25 ENCOUNTER — Telehealth: Payer: Self-pay | Admitting: Physician Assistant

## 2021-08-25 NOTE — Telephone Encounter (Signed)
I called the patient and spoke to his daughter.  Dr. Julien Nordmann recommended holding Tarceva for 5 to 7 days.  The patient's daughter is concerned that when he resumes taking it, that the patient will have another rash.  I discussed again with Dr. Julien Nordmann.  Dr. Julien Nordmann recommends resuming the [place] every other day until we see him next week starting later this week.  He will have a restaging CT scan of the chest, abdomen, and pelvis prior to his next appointment on 09/04/2021.  Depending on the results of the scan, if he shows a response to treatment, Dr. Julien Nordmann will likely want to continue on Tarceva and make dose adjustments.  The patient's daughter took off 09/04/2021 for this appointment so she would like to keep his appointment as scheduled.  She expressed understanding with the instructions. ?

## 2021-08-25 NOTE — Telephone Encounter (Signed)
I talked to Dr. Julien Nordmann who recommended holding Tarceva for 5-7 days. I called the patient to let him know this, which means he should resume his Tarceva later this week. I was unable to reach him. I left a voicemail regarding this on his home and mobile number. I also reminding him of the scan appointments. We can also discuss moving his follow up to 4/4 since his scan will be performed on 4/3. I asked that he call us back to ensure that the instructions are clear. Pending a call back.  ?

## 2021-08-28 ENCOUNTER — Other Ambulatory Visit (HOSPITAL_COMMUNITY): Payer: Self-pay

## 2021-08-29 ENCOUNTER — Other Ambulatory Visit (HOSPITAL_COMMUNITY): Payer: Self-pay

## 2021-09-01 ENCOUNTER — Other Ambulatory Visit: Payer: Self-pay

## 2021-09-01 ENCOUNTER — Ambulatory Visit (HOSPITAL_COMMUNITY)
Admission: RE | Admit: 2021-09-01 | Discharge: 2021-09-01 | Disposition: A | Discharge status: Home / Self Care | Payer: Medicare Other | Source: Ambulatory Visit | Attending: Internal Medicine | Admitting: Internal Medicine

## 2021-09-01 ENCOUNTER — Inpatient Hospital Stay: Payer: Medicare Other | Attending: Physician Assistant

## 2021-09-01 DIAGNOSIS — Z8679 Personal history of other diseases of the circulatory system: Secondary | ICD-10-CM | POA: Insufficient documentation

## 2021-09-01 DIAGNOSIS — E119 Type 2 diabetes mellitus without complications: Secondary | ICD-10-CM | POA: Insufficient documentation

## 2021-09-01 DIAGNOSIS — J8410 Pulmonary fibrosis, unspecified: Secondary | ICD-10-CM | POA: Diagnosis not present

## 2021-09-01 DIAGNOSIS — I700 Atherosclerosis of aorta: Secondary | ICD-10-CM | POA: Insufficient documentation

## 2021-09-01 DIAGNOSIS — E278 Other specified disorders of adrenal gland: Secondary | ICD-10-CM | POA: Diagnosis not present

## 2021-09-01 DIAGNOSIS — R5383 Other fatigue: Secondary | ICD-10-CM | POA: Diagnosis not present

## 2021-09-01 DIAGNOSIS — J90 Pleural effusion, not elsewhere classified: Secondary | ICD-10-CM | POA: Insufficient documentation

## 2021-09-01 DIAGNOSIS — C782 Secondary malignant neoplasm of pleura: Secondary | ICD-10-CM | POA: Insufficient documentation

## 2021-09-01 DIAGNOSIS — I4891 Unspecified atrial fibrillation: Secondary | ICD-10-CM | POA: Insufficient documentation

## 2021-09-01 DIAGNOSIS — Z800 Family history of malignant neoplasm of digestive organs: Secondary | ICD-10-CM | POA: Insufficient documentation

## 2021-09-01 DIAGNOSIS — R590 Localized enlarged lymph nodes: Secondary | ICD-10-CM | POA: Insufficient documentation

## 2021-09-01 DIAGNOSIS — C3490 Malignant neoplasm of unspecified part of unspecified bronchus or lung: Secondary | ICD-10-CM

## 2021-09-01 DIAGNOSIS — R21 Rash and other nonspecific skin eruption: Secondary | ICD-10-CM | POA: Insufficient documentation

## 2021-09-01 DIAGNOSIS — C3412 Malignant neoplasm of upper lobe, left bronchus or lung: Secondary | ICD-10-CM | POA: Insufficient documentation

## 2021-09-01 DIAGNOSIS — R195 Other fecal abnormalities: Secondary | ICD-10-CM | POA: Diagnosis not present

## 2021-09-01 DIAGNOSIS — I714 Abdominal aortic aneurysm, without rupture, unspecified: Secondary | ICD-10-CM | POA: Insufficient documentation

## 2021-09-01 DIAGNOSIS — Z7901 Long term (current) use of anticoagulants: Secondary | ICD-10-CM | POA: Insufficient documentation

## 2021-09-01 DIAGNOSIS — R197 Diarrhea, unspecified: Secondary | ICD-10-CM | POA: Insufficient documentation

## 2021-09-01 DIAGNOSIS — Z923 Personal history of irradiation: Secondary | ICD-10-CM | POA: Insufficient documentation

## 2021-09-01 DIAGNOSIS — Z79899 Other long term (current) drug therapy: Secondary | ICD-10-CM | POA: Insufficient documentation

## 2021-09-01 DIAGNOSIS — R911 Solitary pulmonary nodule: Secondary | ICD-10-CM | POA: Diagnosis not present

## 2021-09-01 DIAGNOSIS — Z8546 Personal history of malignant neoplasm of prostate: Secondary | ICD-10-CM | POA: Insufficient documentation

## 2021-09-01 DIAGNOSIS — I2510 Atherosclerotic heart disease of native coronary artery without angina pectoris: Secondary | ICD-10-CM | POA: Insufficient documentation

## 2021-09-01 LAB — CBC WITH DIFFERENTIAL (CANCER CENTER ONLY)
Abs Immature Granulocytes: 0.19 10*3/uL — ABNORMAL HIGH (ref 0.00–0.07)
Basophils Absolute: 0.1 10*3/uL (ref 0.0–0.1)
Basophils Relative: 1 %
Eosinophils Absolute: 0.6 10*3/uL — ABNORMAL HIGH (ref 0.0–0.5)
Eosinophils Relative: 6 %
HCT: 42.9 % (ref 39.0–52.0)
Hemoglobin: 13.8 g/dL (ref 13.0–17.0)
Immature Granulocytes: 2 %
Lymphocytes Relative: 17 %
Lymphs Abs: 1.6 10*3/uL (ref 0.7–4.0)
MCH: 29.3 pg (ref 26.0–34.0)
MCHC: 32.2 g/dL (ref 30.0–36.0)
MCV: 91.1 fL (ref 80.0–100.0)
Monocytes Absolute: 0.9 10*3/uL (ref 0.1–1.0)
Monocytes Relative: 10 %
Neutro Abs: 5.8 10*3/uL (ref 1.7–7.7)
Neutrophils Relative %: 64 %
Platelet Count: 154 10*3/uL (ref 150–400)
RBC: 4.71 MIL/uL (ref 4.22–5.81)
RDW: 15.5 % (ref 11.5–15.5)
WBC Count: 9.1 10*3/uL (ref 4.0–10.5)
nRBC: 0 % (ref 0.0–0.2)

## 2021-09-01 LAB — CMP (CANCER CENTER ONLY)
ALT: 30 U/L (ref 0–44)
AST: 27 U/L (ref 15–41)
Albumin: 3.8 g/dL (ref 3.5–5.0)
Alkaline Phosphatase: 105 U/L (ref 38–126)
Anion gap: 7 (ref 5–15)
BUN: 24 mg/dL — ABNORMAL HIGH (ref 8–23)
CO2: 27 mmol/L (ref 22–32)
Calcium: 9.4 mg/dL (ref 8.9–10.3)
Chloride: 102 mmol/L (ref 98–111)
Creatinine: 0.91 mg/dL (ref 0.61–1.24)
GFR, Estimated: >60 mL/min (ref >60)
Glucose, Bld: 202 mg/dL — ABNORMAL HIGH (ref 70–99)
Potassium: 4.4 mmol/L (ref 3.5–5.1)
Sodium: 136 mmol/L (ref 135–145)
Total Bilirubin: 0.5 mg/dL (ref 0.3–1.2)
Total Protein: 7.5 g/dL (ref 6.5–8.1)

## 2021-09-01 LAB — TSH: TSH: 1.335 u[IU]/mL (ref 0.320–4.118)

## 2021-09-01 IMAGING — CT CT ABD-PELV W/ CM
2 of 5 series · 12 of 36 positions shown, 15 images · IV contrast (OMNIPAQUE)
Comparison: CT chest, [DATE], PET-CT, [DATE]

CLINICAL DATA: Non-small cell lung cancer staging * Tracking Code:
BO *

EXAM:
CT CHEST, ABDOMEN, AND PELVIS WITH CONTRAST
TECHNIQUE: Multidetector CT imaging of the chest, abdomen and pelvis was
performed following the standard protocol during bolus
administration of intravenous contrast.

[Series 2: cap with · axial · 0.79mm/px · z∈[-437,+53]mm · 9 of 124 slices shown, 12 images]
[im 13/124  mediastinal]
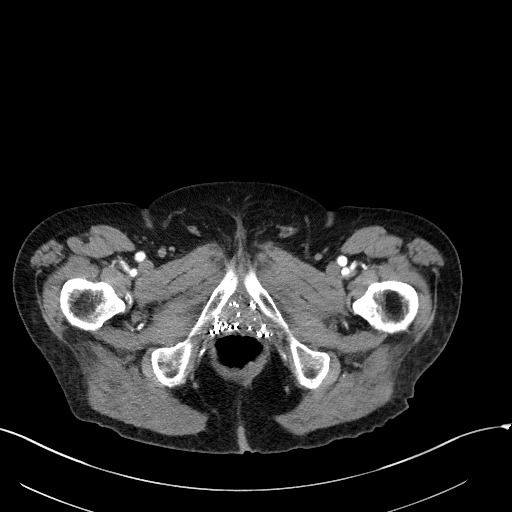
[im 13/124  lung]
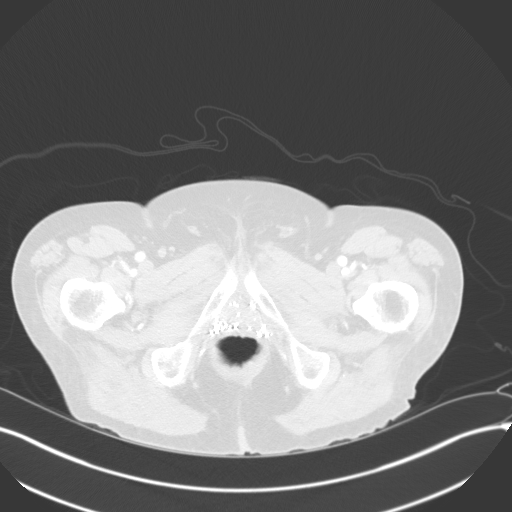
[im 25/124  lung]
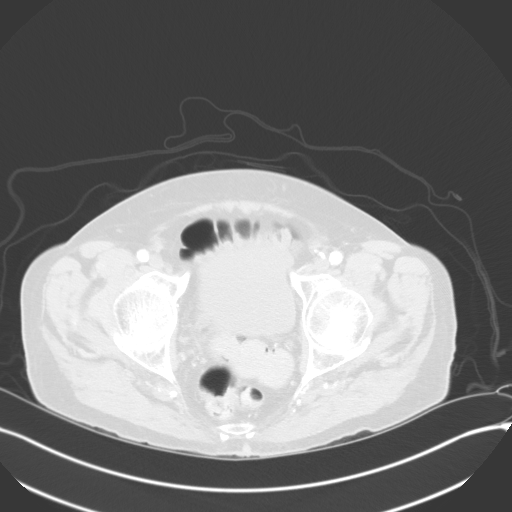
[im 37/124  lung]
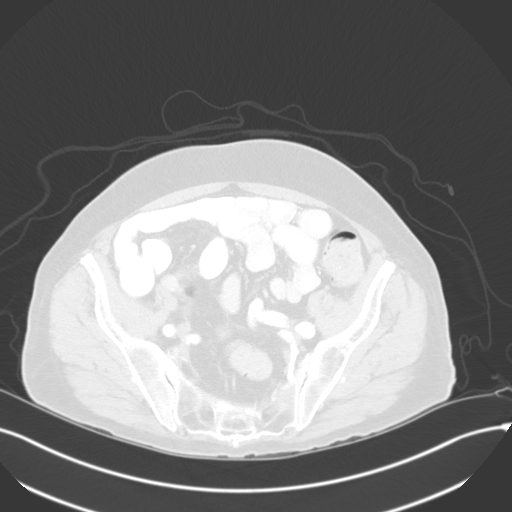
[im 50/124  lung]
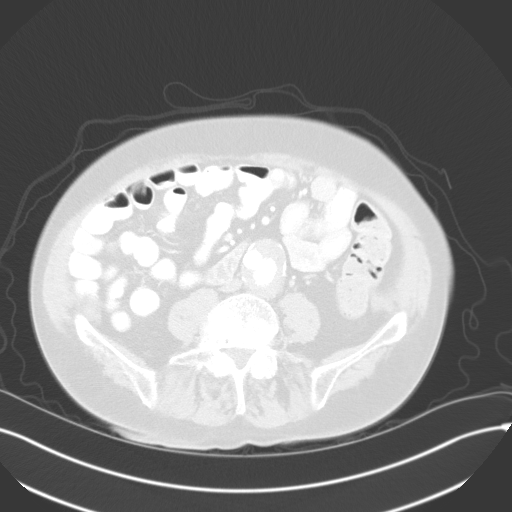
[im 62/124  mediastinal]
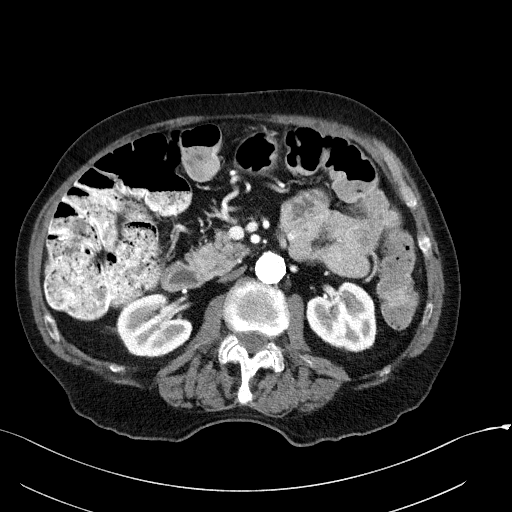
[im 62/124  lung]
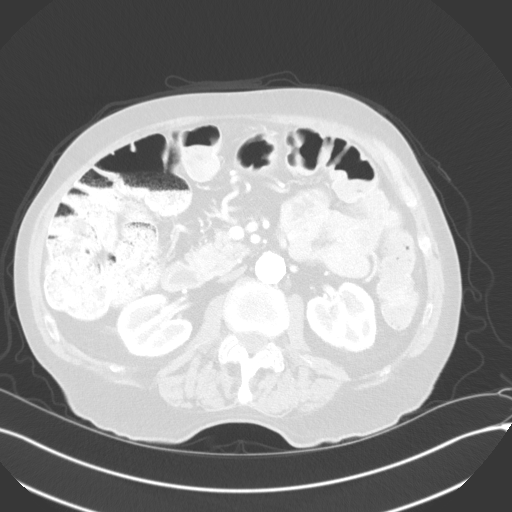
[im 74/124  lung]
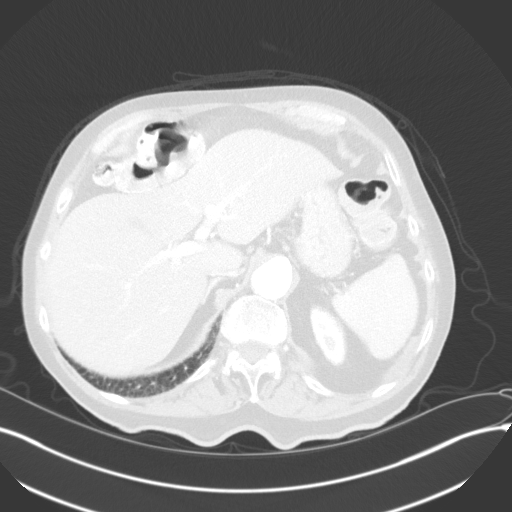
[im 87/124  lung]
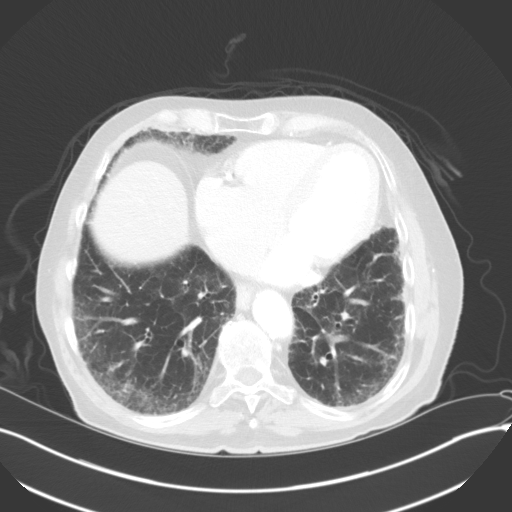
[im 99/124  lung]
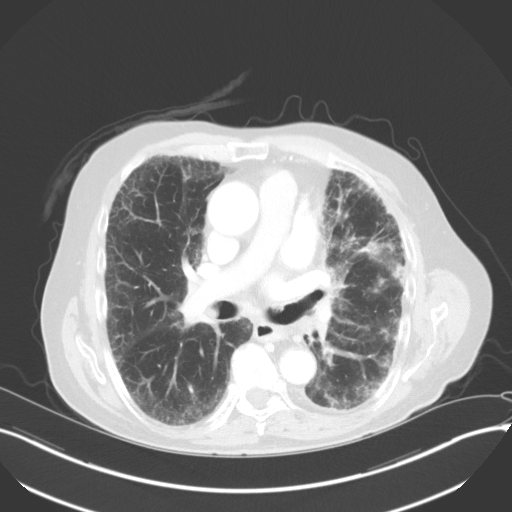
[im 111/124  mediastinal]
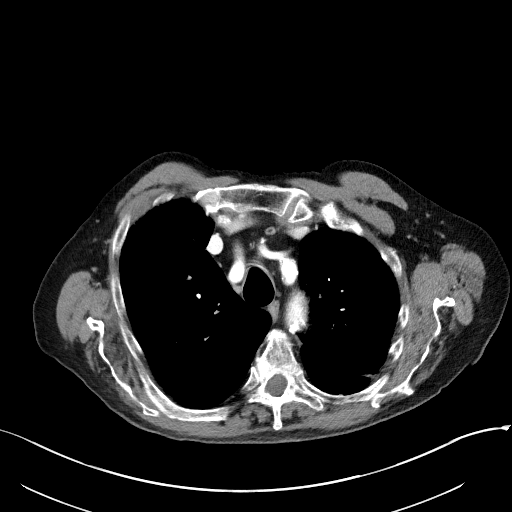
[im 111/124  lung]
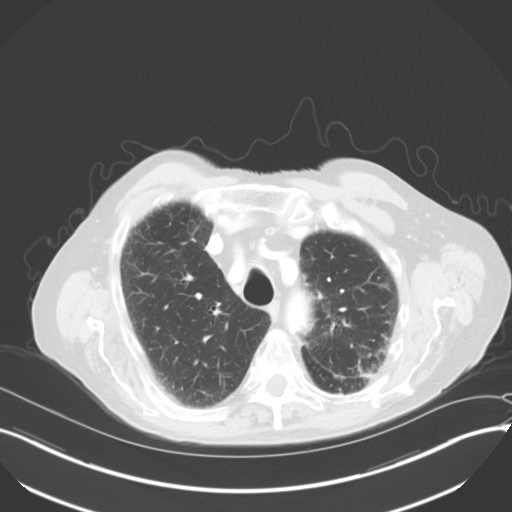

[Series 3: coronals · coronal · 0.75mm/px · 3 of 155 slices shown]
[im 31/155  lung]
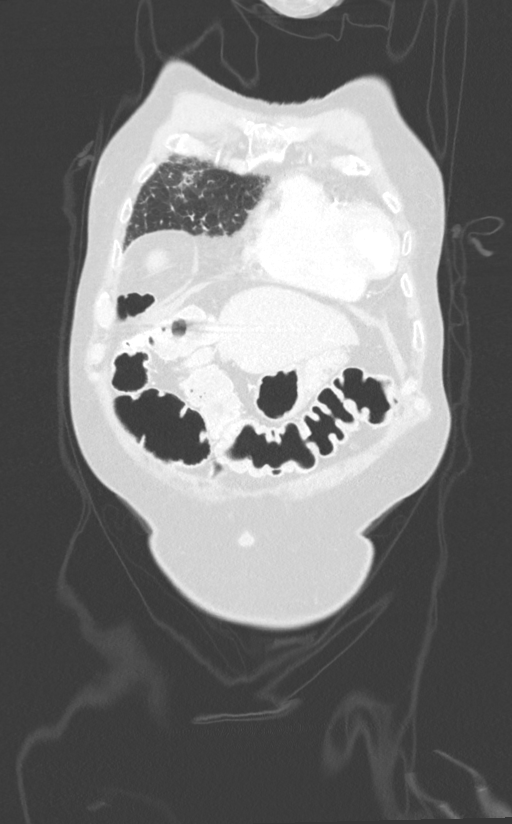
[im 62/155  lung]
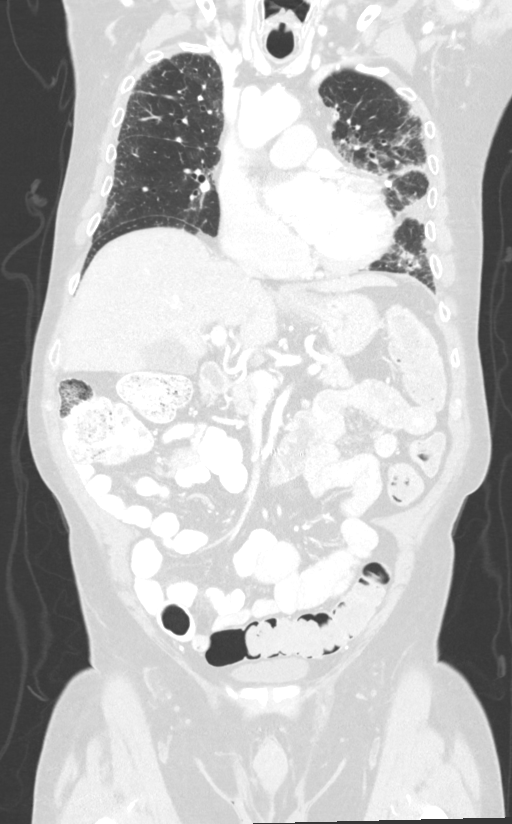
[im 93/155  lung]
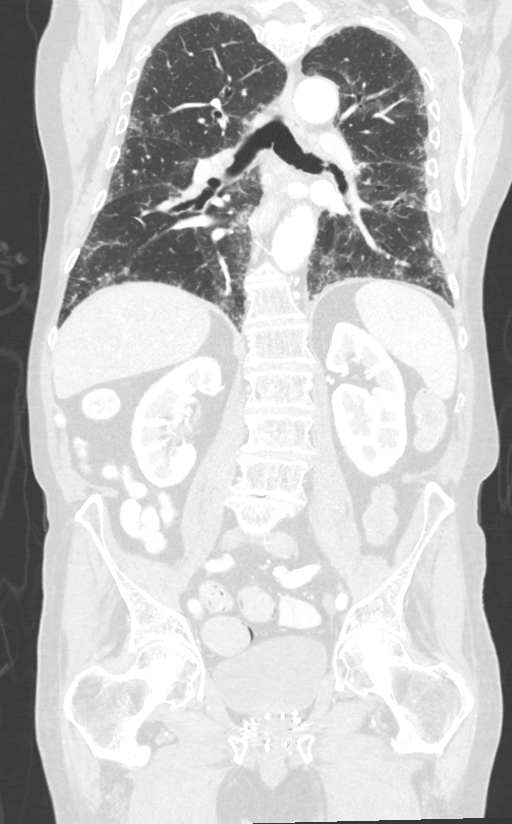

[12 of 36 positions shown; findings below may reference images not displayed]

RADIATION DOSE REDUCTION: This exam was performed according to the
departmental dose-optimization program which includes automated
exposure control, adjustment of the mA and/or kV according to
patient size and/or use of iterative reconstruction technique.

CONTRAST:  100mL OMNIPAQUE IOHEXOL 300 MG/ML SOLN, additional oral
enteric contrast
FINDINGS: CT CHEST FINDINGS

Cardiovascular: Aortic atherosclerosis. Aortic valve stent
endograft. Cardiomegaly. Three-vessel coronary artery
calcifications. No pericardial effusion.

Mediastinum/Nodes: Interval decrease in size enlarged mediastinal
and hilar lymph nodes, largest AP window node measuring 1.4 x
cm, previously 2.2 x 1.5 cm (series 2, image 21), largest
pretracheal node measuring 1.6 x 0.8 cm, previously 2.2 x 1.3 cm
(series 2, image 19). Thyroid gland, trachea, and esophagus
demonstrate no significant findings.

Lungs/Pleura: There is a persistent, trace left pleural effusion
with diminished pleural nodularity throughout the left hemithorax.
Small index pleural nodule of the dependent left lower lobe measures
0.3 cm, previously 0.7 cm (series 2, image 37). Redemonstrated
moderate pulmonary fibrosis in a pattern with apical to basal
gradient, featuring irregular peripheral interstitial opacity,
septal thickening, some suggestion of subpleural sparing, and some
centrilobular nodularity. Status post lingular wedge resection
(series 6, image 71). Stable, benign incidental nodule of the right
apex measuring 0.3 cm (series 6, image 17).

Musculoskeletal: No chest wall mass or suspicious osseous lesions
identified.

CT ABDOMEN PELVIS FINDINGS

Hepatobiliary: No solid liver abnormality is seen. No gallstones,
gallbladder wall thickening, or biliary dilatation.

Pancreas: Unremarkable. No pancreatic ductal dilatation or
surrounding inflammatory changes.

Spleen: Normal in size without significant abnormality.

Adrenals/Urinary Tract: Unchanged left adrenal nodule measuring
x 1.8 cm, not previously PET avid and consistent with a benign
adrenal adenoma (series 2, image 54). Kidneys are normal, without
renal calculi, solid lesion, or hydronephrosis. Bladder is
unremarkable.

Stomach/Bowel: Stomach is within normal limits. Appendix appears
normal. No evidence of bowel wall thickening, distention, or
inflammatory changes. Large burden of stool throughout the colon and
rectum.

Vascular/Lymphatic: Aortic atherosclerosis. Abdominal aortic
aneurysm status post aortobiiliac stent endograft repair. No
enlarged abdominal or pelvic lymph nodes.

Reproductive: Prostate brachytherapy.

Other: No abdominal wall hernia or abnormality. No ascites.

Musculoskeletal: No acute osseous findings.
IMPRESSION: 1. Interval decrease in size of enlarged mediastinal and hilar lymph
nodes, consistent with treatment response of nodal metastatic
disease.
2. Diminished pleural nodularity throughout the left hemithorax,
consistent with treatment response of pleural metastatic disease.
Trace, persistent left pleural effusion.
3. Redemonstrated moderate pulmonary fibrosis in a pattern with
apical to basal gradient, featuring irregular peripheral
interstitial opacity, septal thickening, some suggestion of
subpleural sparing, and some centrilobular nodularity. Findings
remain suggestive of an alternative diagnosis (not UIP) per
consensus guidelines: Diagnosis of Idiopathic Pulmonary Fibrosis: An
Official ATS/ERS/JRS/ALAT Clinical Practice Guideline. Am J Respir
Crit Care Med Vol 198, JHONNY JOSE 5, [GK]-e[DATE].
4. No evidence of distant metastatic disease within the abdomen or
pelvis.
5. Unchanged left adrenal nodule, not previously PET avid and
consistent with a benign adrenal adenoma.
6. Coronary artery disease.
7. Status post aortobiiliac stent endograft repair of abdominal
aortic aneurysm.

Aortic Atherosclerosis ([GK]-[GK]).

## 2021-09-01 MED ORDER — IOHEXOL 300 MG/ML  SOLN
100.0000 mL | Freq: Once | INTRAMUSCULAR | Status: AC | PRN
  Administered 2021-09-01: 100 mL via INTRAVENOUS

## 2021-09-04 ENCOUNTER — Inpatient Hospital Stay: Payer: Medicare Other | Admitting: Internal Medicine

## 2021-09-04 ENCOUNTER — Other Ambulatory Visit: Payer: Self-pay

## 2021-09-04 ENCOUNTER — Encounter: Payer: Self-pay | Admitting: Internal Medicine

## 2021-09-04 ENCOUNTER — Other Ambulatory Visit (HOSPITAL_COMMUNITY): Payer: Self-pay

## 2021-09-04 ENCOUNTER — Inpatient Hospital Stay (HOSPITAL_BASED_OUTPATIENT_CLINIC_OR_DEPARTMENT_OTHER): Payer: Medicare Other | Admitting: Internal Medicine

## 2021-09-04 VITALS — BP 123/62 | HR 67 | Temp 97.5°F | Resp 18

## 2021-09-04 DIAGNOSIS — E119 Type 2 diabetes mellitus without complications: Secondary | ICD-10-CM | POA: Diagnosis not present

## 2021-09-04 DIAGNOSIS — R197 Diarrhea, unspecified: Secondary | ICD-10-CM | POA: Diagnosis not present

## 2021-09-04 DIAGNOSIS — Z5111 Encounter for antineoplastic chemotherapy: Secondary | ICD-10-CM | POA: Diagnosis not present

## 2021-09-04 DIAGNOSIS — Z8546 Personal history of malignant neoplasm of prostate: Secondary | ICD-10-CM | POA: Diagnosis not present

## 2021-09-04 DIAGNOSIS — Z8679 Personal history of other diseases of the circulatory system: Secondary | ICD-10-CM | POA: Diagnosis not present

## 2021-09-04 DIAGNOSIS — C3492 Malignant neoplasm of unspecified part of left bronchus or lung: Secondary | ICD-10-CM | POA: Diagnosis not present

## 2021-09-04 DIAGNOSIS — C782 Secondary malignant neoplasm of pleura: Secondary | ICD-10-CM | POA: Diagnosis not present

## 2021-09-04 DIAGNOSIS — I2510 Atherosclerotic heart disease of native coronary artery without angina pectoris: Secondary | ICD-10-CM | POA: Diagnosis not present

## 2021-09-04 DIAGNOSIS — Z7901 Long term (current) use of anticoagulants: Secondary | ICD-10-CM | POA: Diagnosis not present

## 2021-09-04 DIAGNOSIS — R21 Rash and other nonspecific skin eruption: Secondary | ICD-10-CM | POA: Diagnosis not present

## 2021-09-04 DIAGNOSIS — Z79899 Other long term (current) drug therapy: Secondary | ICD-10-CM | POA: Diagnosis not present

## 2021-09-04 DIAGNOSIS — C3412 Malignant neoplasm of upper lobe, left bronchus or lung: Secondary | ICD-10-CM | POA: Diagnosis not present

## 2021-09-04 DIAGNOSIS — R5383 Other fatigue: Secondary | ICD-10-CM | POA: Diagnosis not present

## 2021-09-04 DIAGNOSIS — I700 Atherosclerosis of aorta: Secondary | ICD-10-CM | POA: Diagnosis not present

## 2021-09-04 DIAGNOSIS — Z800 Family history of malignant neoplasm of digestive organs: Secondary | ICD-10-CM | POA: Diagnosis not present

## 2021-09-04 DIAGNOSIS — R590 Localized enlarged lymph nodes: Secondary | ICD-10-CM | POA: Diagnosis not present

## 2021-09-04 DIAGNOSIS — I714 Abdominal aortic aneurysm, without rupture, unspecified: Secondary | ICD-10-CM | POA: Diagnosis not present

## 2021-09-04 DIAGNOSIS — Z5112 Encounter for antineoplastic immunotherapy: Secondary | ICD-10-CM | POA: Diagnosis present

## 2021-09-04 DIAGNOSIS — J90 Pleural effusion, not elsewhere classified: Secondary | ICD-10-CM | POA: Diagnosis not present

## 2021-09-04 DIAGNOSIS — Z923 Personal history of irradiation: Secondary | ICD-10-CM | POA: Diagnosis not present

## 2021-09-04 DIAGNOSIS — I4891 Unspecified atrial fibrillation: Secondary | ICD-10-CM | POA: Diagnosis not present

## 2021-09-04 MED ORDER — ERLOTINIB HCL 100 MG PO TABS
100.0000 mg | ORAL_TABLET | Freq: Every day | ORAL | 3 refills | Status: DC
  Filled 2021-09-04 – 2021-09-05 (×2): qty 30, 30d supply, fill #0
  Filled 2021-10-10: qty 30, 30d supply, fill #1
  Filled 2021-11-03: qty 30, 30d supply, fill #2
  Filled 2021-12-01: qty 30, 30d supply, fill #3

## 2021-09-04 NOTE — Progress Notes (Signed)
?    [facility] ?[phone]   [phone] ? ?OFFICE PROGRESS NOTE ? ?[name], MD ?[address] ?[place] [address] ? ?DIAGNOSIS: Recurrent/metastatic non-small cell lung cancer, adenocarcinoma initially diagnosed as a stage Ib (T2 a, N0, M0) adenocarcinoma in September 2020 status post wedge resection of the left upper lobe nodule.  The patient has disease recurrence and September 2022 with metastatic mediastinal lymphadenopathy as well as left hilar and left pleural metastatic disease ?  ?PD-L1 expression was negative. ?  ?Molecular Studies: Insufficient genetic material on guardant 360 and for foundation 1 ?  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  ?PRIOR THERAPY:  ?1) wedge resection of left upper lobe nodule in September 2020 ?2) Systemic chemotherapy with carboplatin for AUC of 5, Alimta 500 Mg/M2 and Keytruda 200 Mg IV every 3 weeks.  First dose April 30, 2021. Status post 2 cycles. Discontinued due to molecular studies positive for EGFR mutation ?  ?CURRENT THERAPY: Tarceva 150 mg p.o. daily started June 28, 2021.  Status post 2 month of treatment.  His dose was changed to Tarceva 100 mg p.o. daily on 09/04/2021 secondary to intolerance and frequent skin rash. ? ?INTERVAL HISTORY: ?Vincent Velez 81 y.o. male returns to the clinic today for follow-up visit accompanied by his daughter.  The patient is feeling fine today with no concerning complaints except for the frequent flare of his skin rash especially on the upper extremities, back of the neck, legs as well as scalp.  He was given treatment with Medrol Dosepak with improvement of the rash.  He still have some residual rash on the arms.  He denied having any current chest pain, shortness of breath, cough or hemoptysis.  He has no nausea, vomiting but has occasional diarrhea with no constipation or abdominal pain.  He has no  recent weight loss or night sweats.  He has no headache or visual changes.  He is here today for evaluation with repeat CT scan of the chest, abdomen and pelvis for restaging of his disease. ? ? ?MEDICAL HISTORY: ?Past Medical History:  ?Diagnosis Date  ? AAA (abdominal aortic aneurysm) ([place])   ? Anxiety   ? Arthritis   ? Asthma   ? when younger  ? Atrial fibrillation ([place])   ? CAD (coronary artery disease)   ? Cataract   ? removed both  ? COPD (chronic obstructive pulmonary disease) ([place])   ? 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 Dysrhythmia   ? afib  ? Esophageal reflux   ? Family history of malignant neoplasm of gastrointestinal tract   ? Heart murmur   ? Hiatal hernia   ? Hyperlipemia   ? Hypertension   ? Lung nodule   ? a. PET scan highly suspicious for malignancy. Bronch with biopsy to be done after TAVR  ? Malignant neoplasm of prostate ([place])   ? prostate   ? Peripheral vascular disease ([place])   ? Pneumonia   ? Stricture and stenosis of esophagus   ? ? ?ALLERGIES:  is allergic to macrolides and ketolides. ? ?MEDICATIONS:  ?Current Outpatient Medications  ?Medication Sig Dispense Refill  ? ACCU-CHEK GUIDE test strip USE TO CHECK YOUR BLOOD SUGAR ONCE DAILY    ? acetaminophen (TYLENOL) 500 MG tablet Take 2 tablets (1,000 mg total) by mouth every 6 (six) hours as needed for mild pain or fever. 30 tablet 0  ?  diltiazem (CARDIZEM SR) 60 MG 12 hr capsule Take 1 capsule (60 mg total) by mouth every 12 (twelve) hours. 60 capsule 7  ? dorzolamide-timolol (COSOPT) 22.3-6.8 MG/ML ophthalmic solution Place 1 drop into both eyes in the morning, at noon, and at bedtime.    ? erlotinib (TARCEVA) 150 MG tablet Take 1 tablet (150 mg total) by mouth daily. Take on an empty stomach 1 hour before meals or 2 hours after. 30 tablet 3  ? folic acid (FOLVITE) 1 MG tablet Take 1 tablet (1 mg total) by mouth daily. 30 tablet 4  ? glimepiride (AMARYL) 2 MG tablet Take 2 mg by mouth daily with breakfast.    ? JARDIANCE 25 MG TABS tablet Take  25 mg by mouth daily.    ? metFORMIN (GLUCOPHAGE-XR) 500 MG 24 hr tablet Take 1,000 mg by mouth every evening.     ? methylPREDNISolone (MEDROL DOSEPAK) 4 MG TBPK tablet Use as instructed 21 tablet 0  ? metoprolol succinate (TOPROL-XL) 50 MG 24 hr tablet Take 50 mg by mouth daily.    ? Multiple Vitamins-Minerals (MULTIVITAMIN WITH MINERALS) tablet Take 1 tablet by mouth daily.    ? Multiple Vitamins-Minerals (ZINC PO) Take 1 tablet by mouth daily.    ? prochlorperazine (COMPAZINE) 10 MG tablet Take 1 tablet (10 mg total) by mouth every 6 (six) hours as needed for nausea or vomiting. 30 tablet 0  ? simvastatin (ZOCOR) 40 MG tablet Take 40 mg by mouth at bedtime.    ? XARELTO 20 MG TABS tablet TAKE 1 TABLET BY MOUTH  DAILY WITH SUPPER 90 tablet 1  ? ?No current facility-administered medications for this visit.  ? ? ?SURGICAL HISTORY:  ?Past Surgical History:  ?Procedure Laterality Date  ? ABDOMINAL AORTA STENT    ? CARDIAC CATHETERIZATION    ? CARDIOVERSION N/A 12/04/2015  ? Procedure: CARDIOVERSION;  Surgeon: Sanda Klein, MD;  [facility];  Service: Cardiovascular;  Laterality: N/A;  ? COLONOSCOPY    ? FINGER SURGERY Right   ? middle finger- amputation  ? INSERTION PROSTATE RADIATION SEED    ? MEDIASTINOSCOPY N/A 01/06/2019  ? Procedure: MEDIASTINOSCOPY WITH BIOPIES;  Surgeon: Gaye Pollack, MD;  [facility];  Service: Thoracic;  Laterality: N/A;  ? RIGHT/LEFT HEART CATH AND CORONARY ANGIOGRAPHY N/A 10/26/2018  ? Procedure: RIGHT/LEFT HEART CATH AND CORONARY ANGIOGRAPHY;  Surgeon: Burnell Blanks, MD;  Location: [facility];  Service: Cardiovascular;  Laterality: N/A;  ? TEE WITHOUT CARDIOVERSION N/A 12/06/2018  ? Procedure: TRANSESOPHAGEAL ECHOCARDIOGRAM (TEE);  Surgeon: Burnell Blanks, MD;  Location: [facility];  Service: Open Heart Surgery;  Laterality: N/A;  ? TRANSCATHETER AORTIC VALVE REPLACEMENT, TRANSFEMORAL N/A 12/06/2018  ? Procedure: TRANSCATHETER AORTIC VALVE  REPLACEMENT, TRANSFEMORAL;  Surgeon: Burnell Blanks, MD;  Location: [facility];  Service: Open Heart Surgery;  Laterality: N/A;  ? UPPER GASTROINTESTINAL ENDOSCOPY    ? VIDEO ASSISTED THORACOSCOPY (VATS)/THOROCOTOMY Left 02/02/2019  ? Procedure: VIDEO ASSISTED THORACOSCOPY (VATS)/THOROCOTOMY;  Surgeon: Gaye Pollack, MD;  [facility];  Service: Thoracic;  Laterality: Left;  ? VIDEO BRONCHOSCOPY N/A 01/06/2019  ? Procedure: VIDEO BRONCHOSCOPY;  Surgeon: Gaye Pollack, MD;  [facility];  Service: Thoracic;  Laterality: N/A;  ? VIDEO BRONCHOSCOPY WITH ENDOBRONCHIAL ULTRASOUND N/A 03/07/2021  ? Procedure: VIDEO BRONCHOSCOPY WITH ENDOBRONCHIAL ULTRASOUND;  Surgeon: [name], MD;  Location: [facility];  Service: Thoracic;  Laterality: N/A;  ? WEDGE RESECTION Left 02/02/2019  ?  Procedure: LUNG RESECTION;  Surgeon: Gaye Pollack, MD;  [facility];  Service: Thoracic;  Laterality: Left;  ? ? ?REVIEW OF SYSTEMS:  Constitutional: positive for fatigue ?Eyes: negative ?Ears, nose, mouth, throat, and face: negative ?Respiratory: negative ?Cardiovascular: negative ?Gastrointestinal: positive for diarrhea ?Genitourinary:negative ?Integument/breast: positive for dryness and rash ?Hematologic/lymphatic: negative ?Musculoskeletal:negative ?Neurological: negative ?Behavioral/Psych: negative ?Endocrine: negative ?Allergic/Immunologic: negative  ? ?PHYSICAL EXAMINATION: General appearance: alert, cooperative, fatigued, and no distress ?Head: Normocephalic, without obvious abnormality, atraumatic ?Neck: no adenopathy, no JVD, supple, symmetrical, trachea midline, and thyroid not enlarged, symmetric, no tenderness/mass/nodules ?Lymph nodes: Cervical, supraclavicular, and axillary nodes normal. ?Resp: clear to auscultation bilaterally ?Back: symmetric, no curvature. ROM normal. No CVA tenderness. ?Cardio: irregularly irregular rhythm ?GI: soft, non-tender; bowel sounds normal; no masses,  no  organomegaly ?Extremities: extremities normal, atraumatic, no cyanosis or edema ?Neurologic: Alert and oriented X 3, normal strength and tone. Normal symmetric reflexes. Normal coordination and gait ? ?ECOG PERFORMANCE

## 2021-09-05 ENCOUNTER — Other Ambulatory Visit (HOSPITAL_COMMUNITY): Payer: Self-pay

## 2021-09-08 ENCOUNTER — Other Ambulatory Visit (HOSPITAL_COMMUNITY): Payer: Self-pay

## 2021-09-22 ENCOUNTER — Other Ambulatory Visit: Payer: Self-pay | Admitting: Internal Medicine

## 2021-09-25 ENCOUNTER — Other Ambulatory Visit: Payer: Self-pay | Admitting: Internal Medicine

## 2021-09-25 ENCOUNTER — Other Ambulatory Visit (HOSPITAL_COMMUNITY): Payer: Self-pay

## 2021-09-26 ENCOUNTER — Encounter: Payer: Self-pay | Admitting: Internal Medicine

## 2021-09-30 ENCOUNTER — Other Ambulatory Visit (HOSPITAL_COMMUNITY): Payer: Self-pay

## 2021-10-01 ENCOUNTER — Other Ambulatory Visit (HOSPITAL_COMMUNITY): Payer: Self-pay

## 2021-10-06 NOTE — Progress Notes (Signed)
[facility] ? ?ASSESSMENT / PLAN: ?Vincent Velez is a 81 y.o. male with atherosclerosis of native arteries of bilateral lower extremities causing intermittent claudication. ? ?Patient counseled patients with asymptomatic peripheral arterial disease or claudication have a 1-2% risk of developing chronic limb threatening ischemia, but a 15-30% risk of mortality in the next 5 years. Intervention should only be considered for medically optimized patients with disabling symptoms.  ? ?Recommend the following which can slow the progression of atherosclerosis and reduce the risk of major adverse cardiac / limb events:  ?Complete cessation from all tobacco products. ?Blood glucose control with goal A1c < 7%. ?Blood pressure control with goal blood pressure < 140/90 mmHg. ?Lipid reduction therapy with goal LDL-C <100 mg/dL (<70 if symptomatic from PAD).  ?Aspirin 81mg  PO QD.  ?Atorvastatin 40-80mg  PO QD (or other "high intensity" statin therapy). ?Daily walking to and past the point of discomfort.  ?Adequate hydration (at least 2 liters / day) if patient's heart and kidney function is adequate. ? ?I encouraged the patient to continue conservative therapy as long as he can tolerate.  I will see him back in a year with duplex ultrasound of the abdominal aorta and ankle-brachial index. ? ?CHIEF COMPLAINT: Follow-up EVAR and peripheral arterial disease ? ?HISTORY OF PRESENT ILLNESS: ?Vincent Velez is a 81 y.o. male returns to clinic for short interval follow-up of peripheral arterial disease.  The patient reports improving claudication symptoms.  He has no symptoms typical of ischemic rest pain.  He has no ulcers about his feet.  He is remaining active.  He is tolerating chemotherapy for stage IV lung cancer reasonably well. ? ?Past Medical History:  ?Diagnosis Date  ? AAA (abdominal aortic aneurysm) ([place])   ? Anxiety   ? Arthritis   ? Asthma   ? when younger  ? Atrial fibrillation ([place])   ?  CAD (coronary artery disease)   ? Cataract   ? removed both  ? COPD (chronic obstructive pulmonary disease) ([place])   ? 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 Dysrhythmia   ? afib  ? Esophageal reflux   ? Family history of malignant neoplasm of gastrointestinal tract   ? Heart murmur   ? Hiatal hernia   ? Hyperlipemia   ? Hypertension   ? Lung nodule   ? a. PET scan highly suspicious for malignancy. Bronch with biopsy to be done after TAVR  ? Malignant neoplasm of prostate ([place])   ? prostate   ? Peripheral vascular disease ([place])   ? Pneumonia   ? Stricture and stenosis of esophagus   ? ? ?Past Surgical History:  ?Procedure Laterality Date  ? ABDOMINAL AORTA STENT    ? CARDIAC CATHETERIZATION    ? CARDIOVERSION N/A 12/04/2015  ? Procedure: CARDIOVERSION;  Surgeon: Sanda Klein, MD;  [facility];  Service: Cardiovascular;  Laterality: N/A;  ? COLONOSCOPY    ? FINGER SURGERY Right   ? middle finger- amputation  ? INSERTION PROSTATE RADIATION SEED    ? MEDIASTINOSCOPY N/A 01/06/2019  ? Procedure: MEDIASTINOSCOPY WITH BIOPIES;  Surgeon: Gaye Pollack, MD;  [facility];  Service: Thoracic;  Laterality: N/A;  ? RIGHT/LEFT HEART CATH AND CORONARY ANGIOGRAPHY N/A 10/26/2018  ? Procedure: RIGHT/LEFT HEART CATH AND CORONARY ANGIOGRAPHY;  Surgeon: Burnell Blanks, MD;  Location: [facility];  Service: Cardiovascular;  Laterality: N/A;  ? TEE WITHOUT CARDIOVERSION N/A 12/06/2018  ? Procedure: TRANSESOPHAGEAL ECHOCARDIOGRAM (TEE);  Surgeon: Burnell Blanks, MD;  Location: [facility];  Service: Open Heart Surgery;  Laterality: N/A;  ? TRANSCATHETER AORTIC VALVE REPLACEMENT, TRANSFEMORAL N/A 12/06/2018  ? Procedure: TRANSCATHETER AORTIC VALVE REPLACEMENT, TRANSFEMORAL;  Surgeon: Burnell Blanks, MD;  Location: [facility];  Service: Open Heart Surgery;  Laterality: N/A;  ? UPPER GASTROINTESTINAL ENDOSCOPY    ? VIDEO ASSISTED THORACOSCOPY (VATS)/THOROCOTOMY Left 02/02/2019  ? Procedure:  VIDEO ASSISTED THORACOSCOPY (VATS)/THOROCOTOMY;  Surgeon: Gaye Pollack, MD;  [facility];  Service: Thoracic;  Laterality: Left;  ? VIDEO BRONCHOSCOPY N/A 01/06/2019  ? Procedure: VIDEO BRONCHOSCOPY;  Surgeon: Gaye Pollack, MD;  [facility];  Service: Thoracic;  Laterality: N/A;  ? VIDEO BRONCHOSCOPY WITH ENDOBRONCHIAL ULTRASOUND N/A 03/07/2021  ? Procedure: VIDEO BRONCHOSCOPY WITH ENDOBRONCHIAL ULTRASOUND;  Surgeon: [name], MD;  Location: [facility];  Service: Thoracic;  Laterality: N/A;  ? WEDGE RESECTION Left 02/02/2019  ? Procedure: LUNG RESECTION;  Surgeon: Gaye Pollack, MD;  Location: [facility];  Service: Thoracic;  Laterality: Left;  ? ? ?Family History  ?Problem Relation Age of Onset  ? Colon cancer Father   ?     questionable  ? Diabetes Mother   ? Heart disease Mother   ?     Heart Disease before age 7  ? Deep vein thrombosis Mother   ? Hyperlipidemia Mother   ? Hypertension Mother   ? Varicose Veins Mother   ? Hypertension Brother   ? Heart attack Brother   ? Prostate cancer Brother   ? Colon polyps Neg Hx   ? Esophageal cancer Neg Hx   ? Rectal cancer Neg Hx   ? Stomach cancer Neg Hx   ? ? ?Social History  ? ?Socioeconomic History  ? Marital status: Widowed  ?  Spouse name: Not on file  ? Number of children: 2  ? Years of education: Not on file  ? Highest education level: Not on file  ?Occupational History  ? Occupation: engineer-retired  ?  Employer: [employer]  ?Tobacco Use  ? Smoking status: Former  ?  Types: Pipe, Cigars  ?  Quit date: 12/2018  ?  Years since quitting: 2.7  ? Smokeless tobacco: Never  ?Vaping Use  ? Vaping Use: Never used  ?Substance and Sexual Activity  ? Alcohol use: Not Currently  ?  Alcohol/week: 0.0 standard drinks  ?  Comment: occ  ? Drug use: No  ? Sexual activity: Not on file  ?Other Topics Concern  ? Not on file  ?Social History Narrative  ? Not on file  ? ?Social Determinants of Health  ? ?Financial Resource Strain: Not on file  ?Food Insecurity: Not on  file  ?Transportation Needs: Not on file  ?Physical Activity: Not on file  ?Stress: Not on file  ?Social Connections: Not on file  ?Intimate Partner Violence: Not on file  ? ? ?Allergies  ?Allergen Reactions  ? Macrolides And Ketolides Other (See Comments)  ?  Pt unsure of this   ? ? ?Current Outpatient Medications  ?Medication Sig Dispense Refill  ? ACCU-CHEK GUIDE test strip USE TO CHECK YOUR BLOOD SUGAR ONCE DAILY    ? acetaminophen (TYLENOL) 500 MG tablet Take 2 tablets (1,000 mg total) by mouth every 6 (six) hours as needed for mild pain or fever. 30 tablet 0  ? diltiazem (CARDIZEM SR) 60 MG 12 hr capsule Take 1 capsule (60 mg total) by mouth every 12 (twelve) hours. 60 capsule  7  ? dorzolamide-timolol (COSOPT) 22.3-6.8 MG/ML ophthalmic solution Place 1 drop into both eyes in the morning, at noon, and at bedtime.    ? erlotinib (TARCEVA) 100 MG tablet Take 1 tablet (100 mg total) by mouth daily. Take on an empty stomach 1 hour before meals or 2 hours after. 30 tablet 3  ? folic acid (FOLVITE) 1 MG tablet Take 1 tablet (1 mg total) by mouth daily. 30 tablet 4  ? glimepiride (AMARYL) 2 MG tablet Take 2 mg by mouth daily with breakfast.    ? JARDIANCE 25 MG TABS tablet Take 25 mg by mouth daily.    ? metFORMIN (GLUCOPHAGE-XR) 500 MG 24 hr tablet Take 1,000 mg by mouth every evening.     ? methylPREDNISolone (MEDROL DOSEPAK) 4 MG TBPK tablet Use as instructed 21 tablet 0  ? metoprolol succinate (TOPROL-XL) 50 MG 24 hr tablet Take 50 mg by mouth daily.    ? Multiple Vitamins-Minerals (MULTIVITAMIN WITH MINERALS) tablet Take 1 tablet by mouth daily.    ? Multiple Vitamins-Minerals (ZINC PO) Take 1 tablet by mouth daily.    ? prochlorperazine (COMPAZINE) 10 MG tablet Take 1 tablet (10 mg total) by mouth every 6 (six) hours as needed for nausea or vomiting. 30 tablet 0  ? simvastatin (ZOCOR) 40 MG tablet Take 40 mg by mouth at bedtime.    ? XARELTO 20 MG TABS tablet TAKE 1 TABLET BY MOUTH  DAILY WITH SUPPER 90 tablet 1   ? ?No current facility-administered medications for this visit.  ? ? ?PHYSICAL EXAM ?Vitals:  ? 10/07/21 1235  ?BP: 109/68  ?Pulse: 99  ?Resp: 20  ?Temp: 98.3 ?F (36.8 ?C)  ?SpO2: 96%  ?Weight: 158 lb

## 2021-10-07 ENCOUNTER — Ambulatory Visit (INDEPENDENT_AMBULATORY_CARE_PROVIDER_SITE_OTHER): Payer: Medicare Other | Admitting: Vascular Surgery

## 2021-10-07 ENCOUNTER — Ambulatory Visit (HOSPITAL_COMMUNITY)
Admission: RE | Admit: 2021-10-07 | Discharge: 2021-10-07 | Disposition: A | Discharge status: Home / Self Care | Payer: Medicare Other | Source: Ambulatory Visit | Attending: Vascular Surgery | Admitting: Vascular Surgery

## 2021-10-07 ENCOUNTER — Encounter: Payer: Self-pay | Admitting: Vascular Surgery

## 2021-10-07 VITALS — BP 109/68 | HR 99 | Temp 98.3°F | Resp 20 | Ht 69.0 in | Wt 158.0 lb

## 2021-10-07 DIAGNOSIS — I70212 Atherosclerosis of native arteries of extremities with intermittent claudication, left leg: Secondary | ICD-10-CM | POA: Diagnosis not present

## 2021-10-07 DIAGNOSIS — I739 Peripheral vascular disease, unspecified: Secondary | ICD-10-CM | POA: Diagnosis not present

## 2021-10-08 ENCOUNTER — Telehealth: Payer: Self-pay | Admitting: Internal Medicine

## 2021-10-08 NOTE — Telephone Encounter (Signed)
Called patient regarding upcoming appointments, patient is notified. 

## 2021-10-09 ENCOUNTER — Inpatient Hospital Stay: Payer: Medicare Other | Attending: Physician Assistant

## 2021-10-09 ENCOUNTER — Inpatient Hospital Stay (HOSPITAL_BASED_OUTPATIENT_CLINIC_OR_DEPARTMENT_OTHER): Payer: Medicare Other | Admitting: Internal Medicine

## 2021-10-09 ENCOUNTER — Other Ambulatory Visit: Payer: Self-pay

## 2021-10-09 VITALS — BP 103/62 | HR 56 | Temp 96.6°F | Wt 161.1 lb

## 2021-10-09 DIAGNOSIS — Z7901 Long term (current) use of anticoagulants: Secondary | ICD-10-CM | POA: Insufficient documentation

## 2021-10-09 DIAGNOSIS — Z800 Family history of malignant neoplasm of digestive organs: Secondary | ICD-10-CM | POA: Diagnosis not present

## 2021-10-09 DIAGNOSIS — I4891 Unspecified atrial fibrillation: Secondary | ICD-10-CM | POA: Diagnosis not present

## 2021-10-09 DIAGNOSIS — Z5111 Encounter for antineoplastic chemotherapy: Secondary | ICD-10-CM | POA: Diagnosis not present

## 2021-10-09 DIAGNOSIS — Z7952 Long term (current) use of systemic steroids: Secondary | ICD-10-CM | POA: Insufficient documentation

## 2021-10-09 DIAGNOSIS — C3412 Malignant neoplasm of upper lobe, left bronchus or lung: Secondary | ICD-10-CM | POA: Diagnosis not present

## 2021-10-09 DIAGNOSIS — R21 Rash and other nonspecific skin eruption: Secondary | ICD-10-CM | POA: Diagnosis not present

## 2021-10-09 DIAGNOSIS — Z79899 Other long term (current) drug therapy: Secondary | ICD-10-CM | POA: Diagnosis not present

## 2021-10-09 DIAGNOSIS — R5383 Other fatigue: Secondary | ICD-10-CM | POA: Insufficient documentation

## 2021-10-09 DIAGNOSIS — R197 Diarrhea, unspecified: Secondary | ICD-10-CM | POA: Insufficient documentation

## 2021-10-09 DIAGNOSIS — C3490 Malignant neoplasm of unspecified part of unspecified bronchus or lung: Secondary | ICD-10-CM | POA: Diagnosis not present

## 2021-10-09 DIAGNOSIS — Z8546 Personal history of malignant neoplasm of prostate: Secondary | ICD-10-CM | POA: Insufficient documentation

## 2021-10-09 DIAGNOSIS — Z923 Personal history of irradiation: Secondary | ICD-10-CM | POA: Diagnosis not present

## 2021-10-09 DIAGNOSIS — C782 Secondary malignant neoplasm of pleura: Secondary | ICD-10-CM | POA: Insufficient documentation

## 2021-10-09 DIAGNOSIS — C3492 Malignant neoplasm of unspecified part of left bronchus or lung: Secondary | ICD-10-CM

## 2021-10-09 DIAGNOSIS — Z5112 Encounter for antineoplastic immunotherapy: Secondary | ICD-10-CM | POA: Diagnosis not present

## 2021-10-09 LAB — CMP (CANCER CENTER ONLY)
ALT: 19 U/L (ref 0–44)
AST: 23 U/L (ref 15–41)
Albumin: 4 g/dL (ref 3.5–5.0)
Alkaline Phosphatase: 105 U/L (ref 38–126)
Anion gap: 6 (ref 5–15)
BUN: 17 mg/dL (ref 8–23)
CO2: 27 mmol/L (ref 22–32)
Calcium: 9.9 mg/dL (ref 8.9–10.3)
Chloride: 103 mmol/L (ref 98–111)
Creatinine: 1.02 mg/dL (ref 0.61–1.24)
GFR, Estimated: >60 mL/min (ref >60)
Glucose, Bld: 299 mg/dL — ABNORMAL HIGH (ref 70–99)
Potassium: 4.8 mmol/L (ref 3.5–5.1)
Sodium: 136 mmol/L (ref 135–145)
Total Bilirubin: 0.7 mg/dL (ref 0.3–1.2)
Total Protein: 7.7 g/dL (ref 6.5–8.1)

## 2021-10-09 LAB — CBC WITH DIFFERENTIAL (CANCER CENTER ONLY)
Abs Immature Granulocytes: 0.07 10*3/uL (ref 0.00–0.07)
Basophils Absolute: 0.1 10*3/uL (ref 0.0–0.1)
Basophils Relative: 1 %
Eosinophils Absolute: 0.6 10*3/uL — ABNORMAL HIGH (ref 0.0–0.5)
Eosinophils Relative: 6 %
HCT: 41.6 % (ref 39.0–52.0)
Hemoglobin: 13.8 g/dL (ref 13.0–17.0)
Immature Granulocytes: 1 %
Lymphocytes Relative: 17 %
Lymphs Abs: 1.6 10*3/uL (ref 0.7–4.0)
MCH: 29.9 pg (ref 26.0–34.0)
MCHC: 33.2 g/dL (ref 30.0–36.0)
MCV: 90 fL (ref 80.0–100.0)
Monocytes Absolute: 0.9 10*3/uL (ref 0.1–1.0)
Monocytes Relative: 10 %
Neutro Abs: 6.3 10*3/uL (ref 1.7–7.7)
Neutrophils Relative %: 65 %
Platelet Count: 151 10*3/uL (ref 150–400)
RBC: 4.62 MIL/uL (ref 4.22–5.81)
RDW: 16.1 % — ABNORMAL HIGH (ref 11.5–15.5)
WBC Count: 9.6 10*3/uL (ref 4.0–10.5)
nRBC: 0 % (ref 0.0–0.2)

## 2021-10-09 NOTE — Progress Notes (Signed)
?    [facility] ?[phone]   [phone] ? ?OFFICE PROGRESS NOTE ? ?[name], MD ?[address] ?[place] [address] ? ?DIAGNOSIS: Recurrent/metastatic non-small cell lung cancer, adenocarcinoma initially diagnosed as a stage Ib (T2 a, N0, M0) adenocarcinoma in September 2020 status post wedge resection of the left upper lobe nodule.  The patient has disease recurrence and September 2022 with metastatic mediastinal lymphadenopathy as well as left hilar and left pleural metastatic disease ?  ?PD-L1 expression was negative. ?  ?Molecular Studies: Insufficient genetic material on guardant 360 and for foundation 1 ?  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  ?PRIOR THERAPY:  ?1) wedge resection of left upper lobe nodule in September 2020 ?2) Systemic chemotherapy with carboplatin for AUC of 5, Alimta 500 Mg/M2 and Keytruda 200 Mg IV every 3 weeks.  First dose April 30, 2021. Status post 2 cycles. Discontinued due to molecular studies positive for EGFR mutation ?  ?CURRENT THERAPY: Tarceva 150 mg p.o. daily started June 28, 2021.  Status post 2 month of treatment.  His dose was changed to Tarceva 100 mg p.o. daily on 09/04/2021 secondary to intolerance and frequent skin rash.  Status post 1 months of treatment. ? ?INTERVAL HISTORY: ?Vincent Velez 81 y.o. male returns to the clinic today for follow-up visit accompanied by his daughter.  The patient is feeling fine today with no concerning complaints except for mild fatigue.  His skin rash is much better on the Tarceva 100 mg p.o. daily.  He denied having any chest pain but has shortness of breath with exertion with mild cough and no hemoptysis.  He has no nausea, vomiting, diarrhea or constipation.  He has no headache or visual changes.  He has no recent weight loss or night sweats.  He is here today for evaluation and repeat blood  work. ? ?MEDICAL HISTORY: ?Past Medical History:  ?Diagnosis Date  ? AAA (abdominal aortic aneurysm) ([facility])   ? Anxiety   ? Arthritis   ? Asthma   ? when younger  ? Atrial fibrillation ([facility])   ? CAD (coronary artery disease)   ? Cataract   ? removed both  ? COPD (chronic obstructive pulmonary disease) ([facility])   ? 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 Dysrhythmia   ? afib  ? Esophageal reflux   ? Family history of malignant neoplasm of gastrointestinal tract   ? Heart murmur   ? Hiatal hernia   ? Hyperlipemia   ? Hypertension   ? Lung nodule   ? a. PET scan highly suspicious for malignancy. Bronch with biopsy to be done after TAVR  ? Malignant neoplasm of prostate ([facility])   ? prostate   ? Peripheral vascular disease ([facility])   ? Pneumonia   ? Stricture and stenosis of esophagus   ? ? ?ALLERGIES:  is allergic to macrolides and ketolides. ? ?MEDICATIONS:  ?Current Outpatient Medications  ?Medication Sig Dispense Refill  ? ACCU-CHEK GUIDE test strip USE TO CHECK YOUR BLOOD SUGAR ONCE DAILY    ? acetaminophen (TYLENOL) 500 MG tablet Take 2 tablets (1,000 mg total) by mouth every 6 (six) hours as needed for mild pain or fever. 30 tablet 0  ? diltiazem (CARDIZEM SR) 60 MG 12 hr capsule Take 1 capsule (60 mg total) by mouth every 12 (twelve) hours. 60 capsule 7  ? dorzolamide-timolol (COSOPT) 22.3-6.8 MG/ML ophthalmic solution Place  1 drop into both eyes in the morning, at noon, and at bedtime.    ? erlotinib (TARCEVA) 100 MG tablet Take 1 tablet (100 mg total) by mouth daily. Take on an empty stomach 1 hour before meals or 2 hours after. 30 tablet 3  ? folic acid (FOLVITE) 1 MG tablet Take 1 tablet (1 mg total) by mouth daily. 30 tablet 4  ? glimepiride (AMARYL) 2 MG tablet Take 2 mg by mouth daily with breakfast.    ? JARDIANCE 25 MG TABS tablet Take 25 mg by mouth daily.    ? metFORMIN (GLUCOPHAGE-XR) 500 MG 24 hr tablet Take 1,000 mg by mouth every evening.     ? methylPREDNISolone (MEDROL DOSEPAK) 4 MG TBPK tablet Use as instructed  21 tablet 0  ? metoprolol succinate (TOPROL-XL) 50 MG 24 hr tablet Take 50 mg by mouth daily.    ? Multiple Vitamins-Minerals (MULTIVITAMIN WITH MINERALS) tablet Take 1 tablet by mouth daily.    ? Multiple Vitamins-Minerals (ZINC PO) Take 1 tablet by mouth daily.    ? prochlorperazine (COMPAZINE) 10 MG tablet Take 1 tablet (10 mg total) by mouth every 6 (six) hours as needed for nausea or vomiting. 30 tablet 0  ? simvastatin (ZOCOR) 40 MG tablet Take 40 mg by mouth at bedtime.    ? XARELTO 20 MG TABS tablet TAKE 1 TABLET BY MOUTH  DAILY WITH SUPPER 90 tablet 1  ? ?No current facility-administered medications for this visit.  ? ? ?SURGICAL HISTORY:  ?Past Surgical History:  ?Procedure Laterality Date  ? ABDOMINAL AORTA STENT    ? CARDIAC CATHETERIZATION    ? CARDIOVERSION N/A 12/04/2015  ? Procedure: CARDIOVERSION;  Surgeon: Sanda Klein, MD;  [facility];  Service: Cardiovascular;  Laterality: N/A;  ? COLONOSCOPY    ? FINGER SURGERY Right   ? middle finger- amputation  ? INSERTION PROSTATE RADIATION SEED    ? MEDIASTINOSCOPY N/A 01/06/2019  ? Procedure: MEDIASTINOSCOPY WITH BIOPIES;  Surgeon: Gaye Pollack, MD;  Location: [facility];  Service: Thoracic;  Laterality: N/A;  ? RIGHT/LEFT HEART CATH AND CORONARY ANGIOGRAPHY N/A 10/26/2018  ? Procedure: RIGHT/LEFT HEART CATH AND CORONARY ANGIOGRAPHY;  Surgeon: Burnell Blanks, MD;  Location: [facility];  Service: Cardiovascular;  Laterality: N/A;  ? TEE WITHOUT CARDIOVERSION N/A 12/06/2018  ? Procedure: TRANSESOPHAGEAL ECHOCARDIOGRAM (TEE);  Surgeon: Burnell Blanks, MD;  Location: [facility];  Service: Open Heart Surgery;  Laterality: N/A;  ? TRANSCATHETER AORTIC VALVE REPLACEMENT, TRANSFEMORAL N/A 12/06/2018  ? Procedure: TRANSCATHETER AORTIC VALVE REPLACEMENT, TRANSFEMORAL;  Surgeon: Burnell Blanks, MD;  Location: [facility];  Service: Open Heart Surgery;  Laterality: N/A;  ? UPPER GASTROINTESTINAL ENDOSCOPY    ? VIDEO  ASSISTED THORACOSCOPY (VATS)/THOROCOTOMY Left 02/02/2019  ? Procedure: VIDEO ASSISTED THORACOSCOPY (VATS)/THOROCOTOMY;  Surgeon: Gaye Pollack, MD;  [facility];  Service: Thoracic;  Laterality: Left;  ? VIDEO BRONCHOSCOPY N/A 01/06/2019  ? Procedure: VIDEO BRONCHOSCOPY;  Surgeon: Gaye Pollack, MD;  [facility];  Service: Thoracic;  Laterality: N/A;  ? VIDEO BRONCHOSCOPY WITH ENDOBRONCHIAL ULTRASOUND N/A 03/07/2021  ? Procedure: VIDEO BRONCHOSCOPY WITH ENDOBRONCHIAL ULTRASOUND;  Surgeon: [name], MD;  Location: [facility];  Service: Thoracic;  Laterality: N/A;  ? WEDGE RESECTION Left 02/02/2019  ? Procedure: LUNG RESECTION;  Surgeon: Gaye Pollack, MD;  [facility];  Service: Thoracic;  Laterality: Left;  ? ? ?REVIEW OF SYSTEMS:  A comprehensive review of systems  was negative except for: Constitutional: positive for fatigue ?Gastrointestinal: positive for diarrhea ?Integument/breast: positive for rash  ? ?PHYSICAL EXAMINATION: General appearance: alert, cooperative, fatigued, and no distress ?Head: Normocephalic, without obvious abnormality, atraumatic ?Neck: no adenopathy, no JVD, supple, symmetrical, trachea midline, and thyroid not enlarged, symmetric, no tenderness/mass/nodules ?Lymph nodes: Cervical, supraclavicular, and axillary nodes normal. ?Resp: clear to auscultation bilaterally ?Back: symmetric, no curvature. ROM normal. No CVA tenderness. ?Cardio: irregularly irregular rhythm ?GI: soft, non-tender; bowel sounds normal; no masses,  no organomegaly ?Extremities: extremities normal, atraumatic, no cyanosis or edema ? ?ECOG PERFORMANCE STATUS: 1 - Symptomatic but completely ambulatory ? ?Blood pressure 103/62, pulse (!) 56, temperature (!) 96.6 ?F (35.9 ?C), temperature source Tympanic, weight 161 lb 2 oz (73.1 kg), SpO2 96 %. ? ?LABORATORY DATA: ?Lab Results  ?Component Value Date  ? WBC 9.6 10/09/2021  ? HGB 13.8 10/09/2021  ? HCT 41.6 10/09/2021  ? MCV 90.0 10/09/2021  ? PLT 151  10/09/2021  ? ? ?  Chemistry   ?   ?Component Value Date/Time  ? NA 136 09/01/2021 1034  ? NA 138 12/06/2019 1438  ? K 4.4 09/01/2021 1034  ? CL 102 09/01/2021 1034  ? CO2 27 09/01/2021 1034  ? BUN 24 (H) 09/01/2021

## 2021-10-10 ENCOUNTER — Other Ambulatory Visit (HOSPITAL_COMMUNITY): Payer: Self-pay

## 2021-10-13 ENCOUNTER — Other Ambulatory Visit (HOSPITAL_COMMUNITY): Payer: Self-pay

## 2021-10-16 ENCOUNTER — Encounter: Payer: Self-pay | Admitting: Internal Medicine

## 2021-11-03 ENCOUNTER — Other Ambulatory Visit (HOSPITAL_COMMUNITY): Payer: Self-pay

## 2021-11-04 ENCOUNTER — Other Ambulatory Visit (HOSPITAL_COMMUNITY): Payer: Self-pay

## 2021-11-07 ENCOUNTER — Other Ambulatory Visit (HOSPITAL_COMMUNITY): Payer: Self-pay

## 2021-11-13 ENCOUNTER — Inpatient Hospital Stay: Payer: Medicare Other | Attending: Physician Assistant

## 2021-11-13 ENCOUNTER — Other Ambulatory Visit: Payer: Self-pay

## 2021-11-13 ENCOUNTER — Inpatient Hospital Stay (HOSPITAL_BASED_OUTPATIENT_CLINIC_OR_DEPARTMENT_OTHER): Payer: Medicare Other | Admitting: Internal Medicine

## 2021-11-13 ENCOUNTER — Encounter: Payer: Self-pay | Admitting: Internal Medicine

## 2021-11-13 VITALS — BP 117/73 | HR 57 | Temp 97.4°F | Resp 17 | Wt 162.1 lb

## 2021-11-13 DIAGNOSIS — C3490 Malignant neoplasm of unspecified part of unspecified bronchus or lung: Secondary | ICD-10-CM | POA: Diagnosis not present

## 2021-11-13 DIAGNOSIS — Z923 Personal history of irradiation: Secondary | ICD-10-CM | POA: Diagnosis not present

## 2021-11-13 DIAGNOSIS — Z7901 Long term (current) use of anticoagulants: Secondary | ICD-10-CM | POA: Insufficient documentation

## 2021-11-13 DIAGNOSIS — C782 Secondary malignant neoplasm of pleura: Secondary | ICD-10-CM | POA: Diagnosis not present

## 2021-11-13 DIAGNOSIS — Z79899 Other long term (current) drug therapy: Secondary | ICD-10-CM | POA: Diagnosis not present

## 2021-11-13 DIAGNOSIS — Z8546 Personal history of malignant neoplasm of prostate: Secondary | ICD-10-CM | POA: Diagnosis not present

## 2021-11-13 DIAGNOSIS — R21 Rash and other nonspecific skin eruption: Secondary | ICD-10-CM | POA: Insufficient documentation

## 2021-11-13 DIAGNOSIS — R5383 Other fatigue: Secondary | ICD-10-CM | POA: Insufficient documentation

## 2021-11-13 DIAGNOSIS — R590 Localized enlarged lymph nodes: Secondary | ICD-10-CM | POA: Diagnosis not present

## 2021-11-13 DIAGNOSIS — Z800 Family history of malignant neoplasm of digestive organs: Secondary | ICD-10-CM | POA: Diagnosis not present

## 2021-11-13 DIAGNOSIS — C3412 Malignant neoplasm of upper lobe, left bronchus or lung: Secondary | ICD-10-CM | POA: Diagnosis not present

## 2021-11-13 LAB — CBC WITH DIFFERENTIAL (CANCER CENTER ONLY)
Abs Immature Granulocytes: 0.1 10*3/uL — ABNORMAL HIGH (ref 0.00–0.07)
Basophils Absolute: 0.1 10*3/uL (ref 0.0–0.1)
Basophils Relative: 1 %
Eosinophils Absolute: 0.4 10*3/uL (ref 0.0–0.5)
Eosinophils Relative: 4 %
HCT: 41.2 % (ref 39.0–52.0)
Hemoglobin: 13.7 g/dL (ref 13.0–17.0)
Immature Granulocytes: 1 %
Lymphocytes Relative: 16 %
Lymphs Abs: 1.6 10*3/uL (ref 0.7–4.0)
MCH: 30 pg (ref 26.0–34.0)
MCHC: 33.3 g/dL (ref 30.0–36.0)
MCV: 90.4 fL (ref 80.0–100.0)
Monocytes Absolute: 0.9 10*3/uL (ref 0.1–1.0)
Monocytes Relative: 8 %
Neutro Abs: 7.3 10*3/uL (ref 1.7–7.7)
Neutrophils Relative %: 70 %
Platelet Count: 160 10*3/uL (ref 150–400)
RBC: 4.56 MIL/uL (ref 4.22–5.81)
RDW: 15.5 % (ref 11.5–15.5)
WBC Count: 10.3 10*3/uL (ref 4.0–10.5)
nRBC: 0 % (ref 0.0–0.2)

## 2021-11-13 LAB — CMP (CANCER CENTER ONLY)
ALT: 25 U/L (ref 0–44)
AST: 32 U/L (ref 15–41)
Albumin: 4 g/dL (ref 3.5–5.0)
Alkaline Phosphatase: 108 U/L (ref 38–126)
Anion gap: 6 (ref 5–15)
BUN: 26 mg/dL — ABNORMAL HIGH (ref 8–23)
CO2: 26 mmol/L (ref 22–32)
Calcium: 10.1 mg/dL (ref 8.9–10.3)
Chloride: 104 mmol/L (ref 98–111)
Creatinine: 0.89 mg/dL (ref 0.61–1.24)
GFR, Estimated: >60 mL/min (ref >60)
Glucose, Bld: 175 mg/dL — ABNORMAL HIGH (ref 70–99)
Potassium: 4.3 mmol/L (ref 3.5–5.1)
Sodium: 136 mmol/L (ref 135–145)
Total Bilirubin: 0.7 mg/dL (ref 0.3–1.2)
Total Protein: 7.5 g/dL (ref 6.5–8.1)

## 2021-11-13 NOTE — Progress Notes (Signed)
[facility] [phone]   [phone]  OFFICE PROGRESS NOTE  [name], MD [address] [place] [address]  DIAGNOSIS: Recurrent/metastatic non-small cell lung cancer, adenocarcinoma initially diagnosed as a stage Ib (T2 a, N0, M0) adenocarcinoma in September 2020 status post wedge resection of the left upper lobe nodule.  The patient has disease recurrence and September 2022 with metastatic mediastinal lymphadenopathy as well as left hilar and left pleural metastatic disease   PD-L1 expression was negative.   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  First dose April 30, 2021. Status post 2 cycles. Discontinued due to molecular studies positive for EGFR mutation   CURRENT THERAPY: Tarceva 150 mg p.o. daily started June 28, 2021.  Status post 2 month of treatment.  His dose was changed to Tarceva 100 mg p.o. daily on 09/04/2021 secondary to intolerance and frequent skin rash.  Status post 2 months of treatment.  INTERVAL HISTORY: Vincent Velez 81 y.o. male returns to the clinic today for follow-up visit accompanied by his grandson.  The patient is feeling fine today with no concerning complaints except for mild rash on the upper back.  He apply hydrocortisone cream with improvement.  He denied having any current nausea, vomiting, diarrhea or constipation.  He has no chest pain, shortness of breath, cough or hemoptysis.  He continues to tolerate his treatment with Tarceva fairly well.  The patient is here today for evaluation and repeat blood work.  MEDICAL HISTORY: Past Medical History:   Diagnosis Date   AAA (abdominal aortic aneurysm) ([facility])    Anxiety    Arthritis    Asthma    when younger   Atrial fibrillation ([facility])    CAD (coronary artery disease)    Cataract    removed both   COPD (chronic obstructive pulmonary disease) ([facility])    Diabetes mellitus    Type II   Dysrhythmia    afib   Esophageal reflux    Family history of malignant neoplasm of gastrointestinal tract    Heart murmur    Hiatal hernia    Hyperlipemia    Hypertension    Lung nodule    a. PET scan highly suspicious for malignancy. Bronch with biopsy to be done after TAVR   Malignant neoplasm of prostate [facility])    prostate    Peripheral vascular disease ([facility])    Pneumonia    Stricture and stenosis of esophagus     ALLERGIES:  is allergic to macrolides and ketolides.  MEDICATIONS:  Current Outpatient Medications  Medication Sig Dispense Refill   ACCU-CHEK GUIDE test strip USE TO CHECK YOUR BLOOD SUGAR ONCE DAILY     acetaminophen (TYLENOL) 500 MG tablet Take 2 tablets (1,000 mg total) by mouth every 6 (six) hours as needed for mild pain or fever. 30 tablet 0   diltiazem (CARDIZEM SR) 60 MG 12 hr capsule Take 1 capsule (60 mg total) by mouth every 12 (twelve) hours. 60 capsule 7   dorzolamide-timolol (COSOPT) 22.3-6.8 MG/ML ophthalmic solution Place 1 drop into both eyes in the morning, at noon, and at bedtime.  erlotinib (TARCEVA) 100 MG tablet Take 1 tablet (100 mg total) by mouth daily. Take on an empty stomach 1 hour before meals or 2 hours after. 30 tablet 3   folic acid (FOLVITE) 1 MG tablet Take 1 tablet (1 mg total) by mouth daily. 30 tablet 4   glimepiride (AMARYL) 2 MG tablet Take 2 mg by mouth daily with breakfast.     JARDIANCE 25 MG TABS tablet Take 25 mg by mouth daily.     metFORMIN (GLUCOPHAGE-XR) 500 MG 24 hr tablet Take 1,000 mg by mouth every evening.      metoprolol succinate (TOPROL-XL) 50 MG 24 hr tablet Take 50 mg by mouth daily.     Multiple Vitamins-Minerals  (MULTIVITAMIN WITH MINERALS) tablet Take 1 tablet by mouth daily.     simvastatin (ZOCOR) 40 MG tablet Take 40 mg by mouth at bedtime.     XARELTO 20 MG TABS tablet TAKE 1 TABLET BY MOUTH  DAILY WITH SUPPER 90 tablet 1   Multiple Vitamins-Minerals (ZINC PO) Take 1 tablet by mouth daily.     prochlorperazine (COMPAZINE) 10 MG tablet Take 1 tablet (10 mg total) by mouth every 6 (six) hours as needed for nausea or vomiting. (Patient not taking: Reported on 11/13/2021) 30 tablet 0   No current facility-administered medications for this visit.    SURGICAL HISTORY:  Past Surgical History:  Procedure Laterality Date   ABDOMINAL AORTA STENT     CARDIAC CATHETERIZATION     CARDIOVERSION N/A 12/04/2015   Procedure: CARDIOVERSION;  Surgeon: Sanda Klein, MD;  Location: [facility];  Service: Cardiovascular;  Laterality: N/A;   COLONOSCOPY     FINGER SURGERY Right    middle finger- amputation   INSERTION PROSTATE RADIATION SEED     MEDIASTINOSCOPY N/A 01/06/2019   Procedure: MEDIASTINOSCOPY WITH BIOPIES;  Surgeon: Gaye Pollack, MD;  Location: [facility];  Service: Thoracic;  Laterality: N/A;   RIGHT/LEFT HEART CATH AND CORONARY ANGIOGRAPHY N/A 10/26/2018   Procedure: RIGHT/LEFT HEART CATH AND CORONARY ANGIOGRAPHY;  Surgeon: Burnell Blanks, MD;  Location: [facility];  Service: Cardiovascular;  Laterality: N/A;   TEE WITHOUT CARDIOVERSION N/A 12/06/2018   Procedure: TRANSESOPHAGEAL ECHOCARDIOGRAM (TEE);  Surgeon: Burnell Blanks, MD;  Location: [facility];  Service: Open Heart Surgery;  Laterality: N/A;   TRANSCATHETER AORTIC VALVE REPLACEMENT, TRANSFEMORAL N/A 12/06/2018   Procedure: TRANSCATHETER AORTIC VALVE REPLACEMENT, TRANSFEMORAL;  Surgeon: Burnell Blanks, MD;  Location: [facility];  Service: Open Heart Surgery;  Laterality: N/A;   UPPER GASTROINTESTINAL ENDOSCOPY     VIDEO ASSISTED THORACOSCOPY (VATS)/THOROCOTOMY Left 02/02/2019   Procedure: VIDEO  ASSISTED THORACOSCOPY (VATS)/THOROCOTOMY;  Surgeon: Gaye Pollack, MD;  Location: [facility] OR;  Service: Thoracic;  Laterality: Left;   VIDEO BRONCHOSCOPY N/A 01/06/2019   Procedure: VIDEO BRONCHOSCOPY;  Surgeon: Gaye Pollack, MD;  Location: [facility];  Service: Thoracic;  Laterality: N/A;   VIDEO BRONCHOSCOPY WITH ENDOBRONCHIAL ULTRASOUND N/A 03/07/2021   Procedure: VIDEO BRONCHOSCOPY WITH ENDOBRONCHIAL ULTRASOUND;  Surgeon: [name], MD;  Location: [facility];  Service: Thoracic;  Laterality: N/A;   WEDGE RESECTION Left 02/02/2019   Procedure: LUNG RESECTION;  Surgeon: Gaye Pollack, MD;  [facility];  Service: Thoracic;  Laterality: Left;    REVIEW OF SYSTEMS:  A comprehensive review of systems was negative except for: Constitutional: positive for fatigue Integument/breast: positive for rash   PHYSICAL EXAMINATION: General appearance: alert, cooperative, fatigued, and no distress Head: Normocephalic,  without obvious abnormality, atraumatic Neck: no adenopathy, no JVD, supple, symmetrical, trachea midline, and thyroid not enlarged, symmetric, no tenderness/mass/nodules Lymph nodes: Cervical, supraclavicular, and axillary nodes normal. Resp: clear to auscultation bilaterally Back: symmetric, no curvature. ROM normal. No CVA tenderness. Cardio: irregularly irregular rhythm GI: soft, non-tender; bowel sounds normal; no masses,  no organomegaly Extremities: extremities normal, atraumatic, no cyanosis or edema  ECOG PERFORMANCE STATUS: 1 - Symptomatic but completely ambulatory  Blood pressure 117/73, pulse (!) 57, temperature (!) 97.4 F (36.3 C), temperature source Tympanic, resp. rate 17, weight 162 lb 1 oz (73.5 kg), SpO2 95 %.  LABORATORY DATA: Lab Results  Component Value Date   WBC 10.3 11/13/2021   HGB 13.7 11/13/2021   HCT 41.2 11/13/2021   MCV 90.4 11/13/2021   PLT 160 11/13/2021      Chemistry      Component Value Date/Time   NA 136 11/13/2021 0857   NA 138  12/06/2019 1438   K 4.3 11/13/2021 0857   CL 104 11/13/2021 0857   CO2 26 11/13/2021 0857   BUN 26 (H) 11/13/2021 0857   BUN 14 12/06/2019 1438   CREATININE 0.89 11/13/2021 0857   CREATININE 0.81 11/29/2015 1335      Component Value Date/Time   CALCIUM 10.1 11/13/2021 0857   ALKPHOS 108 11/13/2021 0857   AST 32 11/13/2021 0857   ALT 25 11/13/2021 0857   BILITOT 0.7 11/13/2021 0857       RADIOGRAPHIC STUDIES: No results found.  ASSESSMENT AND PLAN:  This is a very pleasant 81 years old white male recently diagnosed with recurrent/metastatic non-small cell lung cancer, adenocarcinoma that was initially diagnosed as stage Ib (T2 a, N0, M0) adenocarcinoma in September 2020 status post wedge resection of the left lower lobe lung nodule and he has recurrence in September 2022 presented with metastatic mediastinal lymphadenopathy in addition to left hilar and left pleural metastatic disease. The patient had molecular studies by foundation 1 but there was insufficient material for the molecular test.  His PD-L1 expression was negative.  I will ask the pathology department to send his original resected tumor in 2020 for molecular studies as it may have enough tissue for testing. The patient started systemic chemotherapy with carboplatin for AUC of 5, Alimta 500 Mg/M2 and Keytruda 200 Mg IV every 3 weeks on April 30, 2021.  Status post 2 cycles.  He had evidence for disease progression after the first 2 cycles of his treatment. He had repeat CT scan of the chest that showed progressive metastatic disease throughout the pleura of the left hemothorax with new right hilar lymphadenopathy and increasing prevascular lymphadenopathy.  His treatment with chemotherapy was discontinued. Molecular studies from his resected tumor in September 2020 showed positive EGFR mutation, exon 24. The patient started treatment with Tarceva 150 mg p.o. daily on June 28, 2021.  He was not a good candidate for  treatment with Tagrisso because of prolonged QT interval and concern about arrhythmia with Tagrisso. He has been on treatment with Tarceva 100 mg p.o. daily and tolerating it much better. The patient has been tolerating this treatment well with no concerning adverse effect except for mild rash. I recommended for him to continue his current treatment with Tarceva 100 mg p.o. daily. I will see him back for follow-up visit in 1 months for evaluation and repeat CT scan of the chest, abdomen and pelvis for restaging of his disease. The patient was advised to call immediately if he has any other  concerning symptoms in the interval. The patient voices understanding of current disease status and treatment options and is in agreement with the current care plan.  All questions were answered. The patient knows to call the clinic with any problems, questions or concerns. We can certainly see the patient much sooner if necessary.  Disclaimer: This note was dictated with voice recognition software. Similar sounding words can inadvertently be transcribed and may not be corrected upon review.

## 2021-11-20 ENCOUNTER — Encounter: Payer: Self-pay | Admitting: *Deleted

## 2021-11-20 NOTE — Progress Notes (Signed)
I followed up on Mr. Vincent Velez's treatment plan and noted he is set up for a follow up with Dr. Julien Nordmann at this time.

## 2021-11-26 ENCOUNTER — Other Ambulatory Visit (HOSPITAL_COMMUNITY): Payer: Self-pay

## 2021-12-01 ENCOUNTER — Other Ambulatory Visit (HOSPITAL_COMMUNITY): Payer: Self-pay

## 2021-12-03 ENCOUNTER — Other Ambulatory Visit (HOSPITAL_COMMUNITY): Payer: Self-pay

## 2021-12-04 ENCOUNTER — Other Ambulatory Visit (HOSPITAL_COMMUNITY): Payer: Self-pay

## 2021-12-12 ENCOUNTER — Other Ambulatory Visit: Payer: Self-pay

## 2021-12-12 ENCOUNTER — Inpatient Hospital Stay: Payer: Medicare Other | Attending: Physician Assistant

## 2021-12-12 ENCOUNTER — Ambulatory Visit (HOSPITAL_COMMUNITY)
Admission: RE | Admit: 2021-12-12 | Discharge: 2021-12-12 | Disposition: A | Discharge status: Home / Self Care | Payer: Medicare Other | Source: Ambulatory Visit | Attending: Internal Medicine | Admitting: Internal Medicine

## 2021-12-12 DIAGNOSIS — I7143 Infrarenal abdominal aortic aneurysm, without rupture: Secondary | ICD-10-CM | POA: Diagnosis not present

## 2021-12-12 DIAGNOSIS — R42 Dizziness and giddiness: Secondary | ICD-10-CM | POA: Insufficient documentation

## 2021-12-12 DIAGNOSIS — Z79899 Other long term (current) drug therapy: Secondary | ICD-10-CM | POA: Insufficient documentation

## 2021-12-12 DIAGNOSIS — C3412 Malignant neoplasm of upper lobe, left bronchus or lung: Secondary | ICD-10-CM | POA: Insufficient documentation

## 2021-12-12 DIAGNOSIS — J90 Pleural effusion, not elsewhere classified: Secondary | ICD-10-CM | POA: Diagnosis not present

## 2021-12-12 DIAGNOSIS — R21 Rash and other nonspecific skin eruption: Secondary | ICD-10-CM | POA: Insufficient documentation

## 2021-12-12 DIAGNOSIS — I4891 Unspecified atrial fibrillation: Secondary | ICD-10-CM | POA: Insufficient documentation

## 2021-12-12 DIAGNOSIS — C3490 Malignant neoplasm of unspecified part of unspecified bronchus or lung: Secondary | ICD-10-CM

## 2021-12-12 DIAGNOSIS — R197 Diarrhea, unspecified: Secondary | ICD-10-CM | POA: Insufficient documentation

## 2021-12-12 DIAGNOSIS — Z902 Acquired absence of lung [part of]: Secondary | ICD-10-CM | POA: Insufficient documentation

## 2021-12-12 DIAGNOSIS — J8410 Pulmonary fibrosis, unspecified: Secondary | ICD-10-CM | POA: Diagnosis not present

## 2021-12-12 DIAGNOSIS — C782 Secondary malignant neoplasm of pleura: Secondary | ICD-10-CM | POA: Insufficient documentation

## 2021-12-12 LAB — CBC WITH DIFFERENTIAL (CANCER CENTER ONLY)
Abs Immature Granulocytes: 0.06 10*3/uL (ref 0.00–0.07)
Basophils Absolute: 0.1 10*3/uL (ref 0.0–0.1)
Basophils Relative: 1 %
Eosinophils Absolute: 0.4 10*3/uL (ref 0.0–0.5)
Eosinophils Relative: 5 %
HCT: 40.8 % (ref 39.0–52.0)
Hemoglobin: 13.7 g/dL (ref 13.0–17.0)
Immature Granulocytes: 1 %
Lymphocytes Relative: 15 %
Lymphs Abs: 1.4 10*3/uL (ref 0.7–4.0)
MCH: 29.9 pg (ref 26.0–34.0)
MCHC: 33.6 g/dL (ref 30.0–36.0)
MCV: 89.1 fL (ref 80.0–100.0)
Monocytes Absolute: 0.8 10*3/uL (ref 0.1–1.0)
Monocytes Relative: 8 %
Neutro Abs: 6.6 10*3/uL (ref 1.7–7.7)
Neutrophils Relative %: 70 %
Platelet Count: 147 10*3/uL — ABNORMAL LOW (ref 150–400)
RBC: 4.58 MIL/uL (ref 4.22–5.81)
RDW: 14.6 % (ref 11.5–15.5)
WBC Count: 9.3 10*3/uL (ref 4.0–10.5)
nRBC: 0 % (ref 0.0–0.2)

## 2021-12-12 LAB — CMP (CANCER CENTER ONLY)
ALT: 16 U/L (ref 0–44)
AST: 20 U/L (ref 15–41)
Albumin: 4.2 g/dL (ref 3.5–5.0)
Alkaline Phosphatase: 114 U/L (ref 38–126)
Anion gap: 8 (ref 5–15)
BUN: 16 mg/dL (ref 8–23)
CO2: 24 mmol/L (ref 22–32)
Calcium: 9.7 mg/dL (ref 8.9–10.3)
Chloride: 104 mmol/L (ref 98–111)
Creatinine: 0.89 mg/dL (ref 0.61–1.24)
GFR, Estimated: >60 mL/min (ref >60)
Glucose, Bld: 149 mg/dL — ABNORMAL HIGH (ref 70–99)
Potassium: 4.2 mmol/L (ref 3.5–5.1)
Sodium: 136 mmol/L (ref 135–145)
Total Bilirubin: 1 mg/dL (ref 0.3–1.2)
Total Protein: 7.6 g/dL (ref 6.5–8.1)

## 2021-12-12 MED ORDER — IOHEXOL 300 MG/ML  SOLN
100.0000 mL | Freq: Once | INTRAMUSCULAR | Status: AC | PRN
  Administered 2021-12-12: 100 mL via INTRAVENOUS

## 2021-12-12 MED ORDER — SODIUM CHLORIDE (PF) 0.9 % IJ SOLN
INTRAMUSCULAR | Status: AC
  Filled 2021-12-12: qty 50

## 2021-12-15 ENCOUNTER — Other Ambulatory Visit: Payer: Self-pay

## 2021-12-15 ENCOUNTER — Inpatient Hospital Stay (HOSPITAL_BASED_OUTPATIENT_CLINIC_OR_DEPARTMENT_OTHER): Payer: Medicare Other | Admitting: Internal Medicine

## 2021-12-15 VITALS — BP 113/68 | HR 83 | Temp 97.6°F | Resp 16 | Wt 163.3 lb

## 2021-12-15 DIAGNOSIS — R42 Dizziness and giddiness: Secondary | ICD-10-CM | POA: Diagnosis not present

## 2021-12-15 DIAGNOSIS — Z902 Acquired absence of lung [part of]: Secondary | ICD-10-CM | POA: Diagnosis not present

## 2021-12-15 DIAGNOSIS — C782 Secondary malignant neoplasm of pleura: Secondary | ICD-10-CM | POA: Diagnosis not present

## 2021-12-15 DIAGNOSIS — R21 Rash and other nonspecific skin eruption: Secondary | ICD-10-CM | POA: Diagnosis not present

## 2021-12-15 DIAGNOSIS — Z79899 Other long term (current) drug therapy: Secondary | ICD-10-CM | POA: Diagnosis not present

## 2021-12-15 DIAGNOSIS — C3412 Malignant neoplasm of upper lobe, left bronchus or lung: Secondary | ICD-10-CM | POA: Diagnosis not present

## 2021-12-15 DIAGNOSIS — C3490 Malignant neoplasm of unspecified part of unspecified bronchus or lung: Secondary | ICD-10-CM | POA: Diagnosis not present

## 2021-12-15 DIAGNOSIS — R197 Diarrhea, unspecified: Secondary | ICD-10-CM | POA: Diagnosis not present

## 2021-12-15 DIAGNOSIS — I4891 Unspecified atrial fibrillation: Secondary | ICD-10-CM | POA: Diagnosis not present

## 2021-12-15 NOTE — Progress Notes (Signed)
[place] [phone]   [phone]  OFFICE PROGRESS NOTE  [name], MD [address] [place] [address]  DIAGNOSIS: Recurrent/metastatic non-small cell lung cancer, adenocarcinoma initially diagnosed as a stage Ib (T2 a, N0, M0) adenocarcinoma in September 2020 status post wedge resection of the left upper lobe nodule.  The patient has disease recurrence and September 2022 with metastatic mediastinal lymphadenopathy as well as left hilar and left pleural metastatic disease   PD-L1 expression was negative.   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  First dose April 30, 2021. Status post 2 cycles. Discontinued due to molecular studies positive for EGFR mutation   CURRENT THERAPY: Tarceva 150 mg p.o. daily started June 28, 2021.  Status post 2 month of treatment.  His dose was changed to Tarceva 100 mg p.o. daily on 09/04/2021 secondary to intolerance and frequent skin rash.  Status post 3 months of treatment.  INTERVAL HISTORY: Vincent Velez 81 y.o. male returns to the clinic today for follow-up visit accompanied by his grandson.  The patient is feeling fine today with no concerning complaints except for occasional dizzy spells.  He denied having any chest pain, shortness of breath, cough or hemoptysis.  He has no nausea, vomiting, diarrhea or constipation.  He continues to have mild skin rash especially in the neck area.  He has no recent weight loss or night sweats.  He has been tolerating his treatment with Tarceva fairly well.  The patient had repeat CT scan of the chest, abdomen and  pelvis performed recently and he is here for evaluation and discussion of his scan results.   MEDICAL HISTORY: Past Medical History:  Diagnosis Date   AAA (abdominal aortic aneurysm) ([place])    Anxiety    Arthritis    Asthma    when younger   Atrial fibrillation ([facility])    CAD (coronary artery disease)    Cataract    removed both   COPD (chronic obstructive pulmonary disease) ([facility])    Diabetes mellitus    Type II   Dysrhythmia    afib   Esophageal reflux    Family history of malignant neoplasm of gastrointestinal tract    Heart murmur    Hiatal hernia    Hyperlipemia    Hypertension    Lung nodule    a. PET scan highly suspicious for malignancy. Bronch with biopsy to be done after TAVR   Malignant neoplasm of prostate [facility])    prostate    Peripheral vascular disease ([place])    Pneumonia    Stricture and stenosis of esophagus     ALLERGIES:  is allergic to macrolides and ketolides.  MEDICATIONS:  Current Outpatient Medications  Medication Sig Dispense Refill   ACCU-CHEK GUIDE test strip USE TO CHECK YOUR BLOOD SUGAR ONCE DAILY     acetaminophen (TYLENOL) 500 MG tablet Take 2 tablets (1,000 mg total) by mouth every 6 (six) hours as needed for mild pain or fever. 30 tablet 0   diltiazem (CARDIZEM SR) 60 MG 12 hr capsule Take 1 capsule (60 mg total) by mouth every 12 (twelve)  hours. 60 capsule 7   dorzolamide-timolol (COSOPT) 22.3-6.8 MG/ML ophthalmic solution Place 1 drop into both eyes in the morning, at noon, and at bedtime.     erlotinib (TARCEVA) 100 MG tablet Take 1 tablet (100 mg total) by mouth daily. Take on an empty stomach 1 hour before meals or 2 hours after. 30 tablet 3   folic acid (FOLVITE) 1 MG tablet Take 1 tablet (1 mg total) by mouth daily. 30 tablet 4   glimepiride (AMARYL) 2 MG tablet Take 2 mg by mouth daily with breakfast.     JARDIANCE 25 MG TABS tablet Take 25 mg by mouth daily.     metFORMIN (GLUCOPHAGE-XR) 500 MG 24 hr tablet Take 1,000 mg by mouth  every evening.      metoprolol succinate (TOPROL-XL) 50 MG 24 hr tablet Take 50 mg by mouth daily.     Multiple Vitamins-Minerals (MULTIVITAMIN WITH MINERALS) tablet Take 1 tablet by mouth daily.     Multiple Vitamins-Minerals (ZINC PO) Take 1 tablet by mouth daily.     prochlorperazine (COMPAZINE) 10 MG tablet Take 1 tablet (10 mg total) by mouth every 6 (six) hours as needed for nausea or vomiting. (Patient not taking: Reported on 11/13/2021) 30 tablet 0   simvastatin (ZOCOR) 40 MG tablet Take 40 mg by mouth at bedtime.     XARELTO 20 MG TABS tablet TAKE 1 TABLET BY MOUTH  DAILY WITH SUPPER 90 tablet 1   No current facility-administered medications for this visit.    SURGICAL HISTORY:  Past Surgical History:  Procedure Laterality Date   ABDOMINAL AORTA STENT     CARDIAC CATHETERIZATION     CARDIOVERSION N/A 12/04/2015   Procedure: CARDIOVERSION;  Surgeon: Sanda Klein, MD;  Location: [facility];  Service: Cardiovascular;  Laterality: N/A;   COLONOSCOPY     FINGER SURGERY Right    middle finger- amputation   INSERTION PROSTATE RADIATION SEED     MEDIASTINOSCOPY N/A 01/06/2019   Procedure: MEDIASTINOSCOPY WITH BIOPIES;  Surgeon: Gaye Pollack, MD;  Location: [facility];  Service: Thoracic;  Laterality: N/A;   RIGHT/LEFT HEART CATH AND CORONARY ANGIOGRAPHY N/A 10/26/2018   Procedure: RIGHT/LEFT HEART CATH AND CORONARY ANGIOGRAPHY;  Surgeon: Burnell Blanks, MD;  Location: [facility];  Service: Cardiovascular;  Laterality: N/A;   TEE WITHOUT CARDIOVERSION N/A 12/06/2018   Procedure: TRANSESOPHAGEAL ECHOCARDIOGRAM (TEE);  Surgeon: Burnell Blanks, MD;  Location: [facility];  Service: Open Heart Surgery;  Laterality: N/A;   TRANSCATHETER AORTIC VALVE REPLACEMENT, TRANSFEMORAL N/A 12/06/2018   Procedure: TRANSCATHETER AORTIC VALVE REPLACEMENT, TRANSFEMORAL;  Surgeon: Burnell Blanks, MD;  Location: [facility];  Service: Open Heart Surgery;  Laterality:  N/A;   UPPER GASTROINTESTINAL ENDOSCOPY     VIDEO ASSISTED THORACOSCOPY (VATS)/THOROCOTOMY Left 02/02/2019   Procedure: VIDEO ASSISTED THORACOSCOPY (VATS)/THOROCOTOMY;  Surgeon: Gaye Pollack, MD;  Location: [facility];  Service: Thoracic;  Laterality: Left;   VIDEO BRONCHOSCOPY N/A 01/06/2019   Procedure: VIDEO BRONCHOSCOPY;  Surgeon: Gaye Pollack, MD;  Location: [facility];  Service: Thoracic;  Laterality: N/A;   VIDEO BRONCHOSCOPY WITH ENDOBRONCHIAL ULTRASOUND N/A 03/07/2021   Procedure: VIDEO BRONCHOSCOPY WITH ENDOBRONCHIAL ULTRASOUND;  Surgeon: [name], MD;  Location: [facility];  Service: Thoracic;  Laterality: N/A;   WEDGE RESECTION Left 02/02/2019   Procedure: LUNG RESECTION;  Surgeon: Gaye Pollack, MD;  Location: [facility];  Service: Thoracic;  Laterality: Left;    REVIEW OF SYSTEMS:  Constitutional:  positive for fatigue Eyes: negative Ears, nose, mouth, throat, and face: negative Respiratory: negative Cardiovascular: negative Gastrointestinal: negative Genitourinary:negative Integument/breast: positive for rash Hematologic/lymphatic: negative Musculoskeletal:negative Neurological: negative Behavioral/Psych: negative Endocrine: negative Allergic/Immunologic: negative   PHYSICAL EXAMINATION: General appearance: alert, cooperative, fatigued, and no distress Head: Normocephalic, without obvious abnormality, atraumatic Neck: no adenopathy, no JVD, supple, symmetrical, trachea midline, and thyroid not enlarged, symmetric, no tenderness/mass/nodules Lymph nodes: Cervical, supraclavicular, and axillary nodes normal. Resp: clear to auscultation bilaterally Back: symmetric, no curvature. ROM normal. No CVA tenderness. Cardio: irregularly irregular rhythm GI: soft, non-tender; bowel sounds normal; no masses,  no organomegaly Extremities: extremities normal, atraumatic, no cyanosis or edema Neurologic: Alert and oriented X 3, normal strength and tone. Normal symmetric reflexes. Normal  coordination and gait  ECOG PERFORMANCE STATUS: 1 - Symptomatic but completely ambulatory  Blood pressure 113/68, pulse 83, temperature 97.6 F (36.4 C), temperature source Oral, resp. rate 16, weight 163 lb 4.8 oz (74.1 kg), SpO2 97 %.  LABORATORY DATA: Lab Results  Component Value Date   WBC 9.3 12/12/2021   HGB 13.7 12/12/2021   HCT 40.8 12/12/2021   MCV 89.1 12/12/2021   PLT 147 (L) 12/12/2021      Chemistry      Component Value Date/Time   NA 136 12/12/2021 1010   NA 138 12/06/2019 1438   K 4.2 12/12/2021 1010   CL 104 12/12/2021 1010   CO2 24 12/12/2021 1010   BUN 16 12/12/2021 1010   BUN 14 12/06/2019 1438   CREATININE 0.89 12/12/2021 1010   CREATININE 0.81 11/29/2015 1335      Component Value Date/Time   CALCIUM 9.7 12/12/2021 1010   ALKPHOS 114 12/12/2021 1010   AST 20 12/12/2021 1010   ALT 16 12/12/2021 1010   BILITOT 1.0 12/12/2021 1010       RADIOGRAPHIC STUDIES: CT Chest W Contrast  Result Date: 12/13/2021 CLINICAL DATA:  Non-small cell lung cancer restaging * Tracking Code: BO * EXAM: CT CHEST, ABDOMEN, AND PELVIS WITH CONTRAST TECHNIQUE: Multidetector CT imaging of the chest, abdomen and pelvis was performed following the standard protocol during bolus administration of intravenous contrast. RADIATION DOSE REDUCTION: This exam was performed according to the departmental dose-optimization program which includes automated exposure control, adjustment of the mA and/or kV according to patient size and/or use of iterative reconstruction technique. CONTRAST:  134m OMNIPAQUE IOHEXOL 300 MG/ML SOLN, additional oral enteric contrast COMPARISON:  09/01/2021 FINDINGS: CT CHEST FINDINGS Cardiovascular: Aortic atherosclerosis. Status post aortic valve stent endograft. Cardiomegaly. Extensive three-vessel coronary artery calcifications. No pericardial effusion. Mediastinum/Nodes: No significant change in enlarged mediastinal and hilar lymph nodes, index pretracheal node  measuring 1.7 x 0.8 cm, previously 1.6 x 0.8 cm (series 2, image 21), index AP window lymph node measuring 1.4 x 0.8 cm, previously 1.3 x 0.8 cm (series 2, image 23). Thyroid gland, trachea, and esophagus demonstrate no significant findings. Lungs/Pleura: Unchanged left-sided pleural thickening with subtle nodularity. Unchanged trace left pleural effusion. Unchanged, underlying moderate pulmonary fibrosis in a pattern with apical to basal gradient featuring irregular peripheral interstitial opacity, septal thickening, some suggestion of subpleural sparing at the dependent lung bases, and fine centrilobular nodularity throughout. Status post lingular wedge resection. Stable, benign 0.3 cm nodule of the right pulmonary apex (series 4, image 24). Musculoskeletal: No chest wall mass or suspicious osseous lesions identifie 1 d. CT ABDOMEN PELVIS FINDINGS Hepatobiliary: No solid liver abnormality is seen. No gallstones, gallbladder wall thickening, or biliary dilatation. Pancreas: Unremarkable. No pancreatic ductal dilatation  or surrounding inflammatory changes. Spleen: Normal in size without significant abnormality. Adrenals/Urinary Tract: Stable left adrenal nodule, not previously FDG avid and consistent with a benign adenoma (series 2, image 56). Kidneys are normal, without renal calculi, solid lesion, or hydronephrosis. Bladder is unremarkable. Stomach/Bowel: Stomach is within normal limits. Appendix appears normal. No evidence of bowel wall thickening, distention, or inflammatory changes. Large burden of stool throughout the colon and rectum. Vascular/Lymphatic: Aortic atherosclerosis. Status post aortobiiliac stent endograft repair of infrarenal abdominal aortic aneurysm. No enlarged abdominal or pelvic lymph nodes. Reproductive: Prostate brachytherapy. Other: No abdominal wall hernia or abnormality. No ascites. Musculoskeletal: No acute osseous findings. IMPRESSION: 1. Unchanged left-sided pleural thickening with  subtle nodularity consistent with treated pleural metastatic disease. Unchanged trace left pleural effusion. 2. No significant change in enlarged mediastinal and hilar lymph nodes, consistent with treated nodal metastatic disease. 3. No evidence of lymphadenopathy or metastatic disease within the abdomen or pelvis. 4. Unchanged underlying moderate pulmonary fibrosis in a pattern with apical to basal gradient featuring irregular peripheral interstitial opacity, septal thickening, some suggestion of subpleural sparing at the dependent lung bases, and fine centrilobular nodularity throughout. Findings are suggestive of an alternative diagnosis (not UIP) per consensus guidelines: Diagnosis of Idiopathic Pulmonary Fibrosis: An Official ATS/ERS/JRS/ALAT Clinical Practice Guideline. [place], Iss 5, [phone], Jan 30 2017. 5. Cardiomegaly and coronary artery disease. 6. Status post aortobiiliac stent endograft repair of abdominal aortic aneurysm. Aortic Atherosclerosis (ICD10-I70.0). Electronically Signed   By: Delanna Ahmadi M.D.   On: 12/13/2021 19:36   CT Abdomen Pelvis W Contrast  Result Date: 12/13/2021 CLINICAL DATA:  Non-small cell lung cancer restaging * Tracking Code: BO * EXAM: CT CHEST, ABDOMEN, AND PELVIS WITH CONTRAST TECHNIQUE: Multidetector CT imaging of the chest, abdomen and pelvis was performed following the standard protocol during bolus administration of intravenous contrast. RADIATION DOSE REDUCTION: This exam was performed according to the departmental dose-optimization program which includes automated exposure control, adjustment of the mA and/or kV according to patient size and/or use of iterative reconstruction technique. CONTRAST:  115m OMNIPAQUE IOHEXOL 300 MG/ML SOLN, additional oral enteric contrast COMPARISON:  09/01/2021 FINDINGS: CT CHEST FINDINGS Cardiovascular: Aortic atherosclerosis. Status post aortic valve stent endograft. Cardiomegaly. Extensive three-vessel  coronary artery calcifications. No pericardial effusion. Mediastinum/Nodes: No significant change in enlarged mediastinal and hilar lymph nodes, index pretracheal node measuring 1.7 x 0.8 cm, previously 1.6 x 0.8 cm (series 2, image 21), index AP window lymph node measuring 1.4 x 0.8 cm, previously 1.3 x 0.8 cm (series 2, image 23). Thyroid gland, trachea, and esophagus demonstrate no significant findings. Lungs/Pleura: Unchanged left-sided pleural thickening with subtle nodularity. Unchanged trace left pleural effusion. Unchanged, underlying moderate pulmonary fibrosis in a pattern with apical to basal gradient featuring irregular peripheral interstitial opacity, septal thickening, some suggestion of subpleural sparing at the dependent lung bases, and fine centrilobular nodularity throughout. Status post lingular wedge resection. Stable, benign 0.3 cm nodule of the right pulmonary apex (series 4, image 24). Musculoskeletal: No chest wall mass or suspicious osseous lesions identifie 1 d. CT ABDOMEN PELVIS FINDINGS Hepatobiliary: No solid liver abnormality is seen. No gallstones, gallbladder wall thickening, or biliary dilatation. Pancreas: Unremarkable. No pancreatic ductal dilatation or surrounding inflammatory changes. Spleen: Normal in size without significant abnormality. Adrenals/Urinary Tract: Stable left adrenal nodule, not previously FDG avid and consistent with a benign adenoma (series 2, image 56). Kidneys are normal, without renal calculi, solid lesion, or hydronephrosis. Bladder is unremarkable. Stomach/Bowel: Stomach  is within normal limits. Appendix appears normal. No evidence of bowel wall thickening, distention, or inflammatory changes. Large burden of stool throughout the colon and rectum. Vascular/Lymphatic: Aortic atherosclerosis. Status post aortobiiliac stent endograft repair of infrarenal abdominal aortic aneurysm. No enlarged abdominal or pelvic lymph nodes. Reproductive: Prostate  brachytherapy. Other: No abdominal wall hernia or abnormality. No ascites. Musculoskeletal: No acute osseous findings. IMPRESSION: 1. Unchanged left-sided pleural thickening with subtle nodularity consistent with treated pleural metastatic disease. Unchanged trace left pleural effusion. 2. No significant change in enlarged mediastinal and hilar lymph nodes, consistent with treated nodal metastatic disease. 3. No evidence of lymphadenopathy or metastatic disease within the abdomen or pelvis. 4. Unchanged underlying moderate pulmonary fibrosis in a pattern with apical to basal gradient featuring irregular peripheral interstitial opacity, septal thickening, some suggestion of subpleural sparing at the dependent lung bases, and fine centrilobular nodularity throughout. Findings are suggestive of an alternative diagnosis (not UIP) per consensus guidelines: Diagnosis of Idiopathic Pulmonary Fibrosis: An Official ATS/ERS/JRS/ALAT Clinical Practice Guideline. [place], Iss 5, [phone], Jan 30 2017. 5. Cardiomegaly and coronary artery disease. 6. Status post aortobiiliac stent endograft repair of abdominal aortic aneurysm. Aortic Atherosclerosis (ICD10-I70.0). Electronically Signed   By: Delanna Ahmadi M.D.   On: 12/13/2021 19:36    ASSESSMENT AND PLAN:  This is a very pleasant 81 years old white male recently diagnosed with recurrent/metastatic non-small cell lung cancer, adenocarcinoma that was initially diagnosed as stage Ib (T2 a, N0, M0) adenocarcinoma in September 2020 status post wedge resection of the left lower lobe lung nodule and he has recurrence in September 2022 presented with metastatic mediastinal lymphadenopathy in addition to left hilar and left pleural metastatic disease. The patient had molecular studies by foundation 1 but there was insufficient material for the molecular test.  His PD-L1 expression was negative.  I will ask the pathology department to send his original  resected tumor in 2020 for molecular studies as it may have enough tissue for testing. The patient started systemic chemotherapy with carboplatin for AUC of 5, Alimta 500 Mg/M2 and Keytruda 200 Mg IV every 3 weeks on April 30, 2021.  Status post 2 cycles.  He had evidence for disease progression after the first 2 cycles of his treatment. He had repeat CT scan of the chest that showed progressive metastatic disease throughout the pleura of the left hemothorax with new right hilar lymphadenopathy and increasing prevascular lymphadenopathy.  His treatment with chemotherapy was discontinued. Molecular studies from his resected tumor in September 2020 showed positive EGFR mutation, exon 37. The patient started treatment with Tarceva 150 mg p.o. daily on June 28, 2021.  He was not a good candidate for treatment with Tagrisso because of prolonged QT interval and concern about arrhythmia with Tagrisso. He has been on treatment with Tarceva 100 mg p.o. daily status post 3 months and tolerating it much better. The patient had repeat CT scan of the chest, abdomen and pelvis performed recently.  I personally and independently reviewed the scans and discussed the result with the patient and his grandson today.  His scan showed no concerning findings for disease progression. I recommended for him to continue his current treatment with Tarceva with the same dose 100 mg p.o. daily. I will see the patient back for follow-up visit in 1 months for evaluation and repeat blood work. For the skin rash she will continue to apply hydrocortisone cream on as-needed basis. For the few episodes of diarrhea he  will use Imodium as needed. For the dizzy spells, this could be secondary to his atrial fibrillation but if persistent will consider repeating MRI of the brain to rule out brain metastasis. The patient was advised to call immediately if he has any other concerning symptoms in the interval.  The patient voices  understanding of current disease status and treatment options and is in agreement with the current care plan.  All questions were answered. The patient knows to call the clinic with any problems, questions or concerns. We can certainly see the patient much sooner if necessary.  Disclaimer: This note was dictated with voice recognition software. Similar sounding words can inadvertently be transcribed and may not be corrected upon review.

## 2021-12-22 ENCOUNTER — Other Ambulatory Visit: Payer: Self-pay

## 2021-12-22 ENCOUNTER — Telehealth: Payer: Self-pay | Admitting: Internal Medicine

## 2021-12-22 NOTE — Telephone Encounter (Signed)
Called patient regarding upcoming Rescheduled August appointment, spoke with patient's daughter. Patient will be notified.

## 2021-12-25 ENCOUNTER — Other Ambulatory Visit (HOSPITAL_COMMUNITY): Payer: Self-pay

## 2021-12-25 ENCOUNTER — Other Ambulatory Visit: Payer: Self-pay | Admitting: Internal Medicine

## 2021-12-25 DIAGNOSIS — C3492 Malignant neoplasm of unspecified part of left bronchus or lung: Secondary | ICD-10-CM

## 2021-12-25 MED ORDER — ERLOTINIB HCL 100 MG PO TABS
100.0000 mg | ORAL_TABLET | Freq: Every day | ORAL | 3 refills | Status: DC
  Filled 2021-12-25: qty 30, 30d supply, fill #0
  Filled 2022-01-20: qty 30, 30d supply, fill #1
  Filled 2022-02-24: qty 30, 30d supply, fill #2
  Filled 2022-03-24: qty 30, 30d supply, fill #3

## 2021-12-26 ENCOUNTER — Other Ambulatory Visit (HOSPITAL_COMMUNITY): Payer: Self-pay

## 2021-12-27 ENCOUNTER — Other Ambulatory Visit: Payer: Self-pay

## 2021-12-29 ENCOUNTER — Other Ambulatory Visit (HOSPITAL_COMMUNITY): Payer: Self-pay

## 2022-01-12 DIAGNOSIS — E7800 Pure hypercholesterolemia, unspecified: Secondary | ICD-10-CM | POA: Diagnosis not present

## 2022-01-12 DIAGNOSIS — Z23 Encounter for immunization: Secondary | ICD-10-CM | POA: Diagnosis not present

## 2022-01-12 DIAGNOSIS — F419 Anxiety disorder, unspecified: Secondary | ICD-10-CM | POA: Diagnosis not present

## 2022-01-12 DIAGNOSIS — Z952 Presence of prosthetic heart valve: Secondary | ICD-10-CM | POA: Diagnosis not present

## 2022-01-12 DIAGNOSIS — I10 Essential (primary) hypertension: Secondary | ICD-10-CM | POA: Diagnosis not present

## 2022-01-12 DIAGNOSIS — C61 Malignant neoplasm of prostate: Secondary | ICD-10-CM | POA: Diagnosis not present

## 2022-01-12 DIAGNOSIS — I480 Paroxysmal atrial fibrillation: Secondary | ICD-10-CM | POA: Diagnosis not present

## 2022-01-12 DIAGNOSIS — Z1331 Encounter for screening for depression: Secondary | ICD-10-CM | POA: Diagnosis not present

## 2022-01-12 DIAGNOSIS — Z0000 Encounter for general adult medical examination without abnormal findings: Secondary | ICD-10-CM | POA: Diagnosis not present

## 2022-01-12 DIAGNOSIS — E1169 Type 2 diabetes mellitus with other specified complication: Secondary | ICD-10-CM | POA: Diagnosis not present

## 2022-01-12 DIAGNOSIS — C3492 Malignant neoplasm of unspecified part of left bronchus or lung: Secondary | ICD-10-CM | POA: Diagnosis not present

## 2022-01-15 ENCOUNTER — Other Ambulatory Visit: Payer: Medicare Other

## 2022-01-15 ENCOUNTER — Ambulatory Visit: Payer: Medicare Other | Admitting: Internal Medicine

## 2022-01-19 ENCOUNTER — Inpatient Hospital Stay: Payer: Medicare Other | Attending: Physician Assistant

## 2022-01-19 ENCOUNTER — Other Ambulatory Visit: Payer: Self-pay

## 2022-01-19 ENCOUNTER — Inpatient Hospital Stay (HOSPITAL_BASED_OUTPATIENT_CLINIC_OR_DEPARTMENT_OTHER): Payer: Medicare Other | Admitting: Internal Medicine

## 2022-01-19 VITALS — BP 107/72 | HR 74 | Temp 97.8°F | Wt 162.2 lb

## 2022-01-19 DIAGNOSIS — C3412 Malignant neoplasm of upper lobe, left bronchus or lung: Secondary | ICD-10-CM | POA: Diagnosis not present

## 2022-01-19 DIAGNOSIS — Z8546 Personal history of malignant neoplasm of prostate: Secondary | ICD-10-CM | POA: Insufficient documentation

## 2022-01-19 DIAGNOSIS — Z7901 Long term (current) use of anticoagulants: Secondary | ICD-10-CM | POA: Diagnosis not present

## 2022-01-19 DIAGNOSIS — R590 Localized enlarged lymph nodes: Secondary | ICD-10-CM | POA: Diagnosis not present

## 2022-01-19 DIAGNOSIS — Z923 Personal history of irradiation: Secondary | ICD-10-CM | POA: Diagnosis not present

## 2022-01-19 DIAGNOSIS — C3490 Malignant neoplasm of unspecified part of unspecified bronchus or lung: Secondary | ICD-10-CM

## 2022-01-19 DIAGNOSIS — R197 Diarrhea, unspecified: Secondary | ICD-10-CM | POA: Insufficient documentation

## 2022-01-19 DIAGNOSIS — R21 Rash and other nonspecific skin eruption: Secondary | ICD-10-CM | POA: Insufficient documentation

## 2022-01-19 DIAGNOSIS — C782 Secondary malignant neoplasm of pleura: Secondary | ICD-10-CM | POA: Diagnosis not present

## 2022-01-19 DIAGNOSIS — Z800 Family history of malignant neoplasm of digestive organs: Secondary | ICD-10-CM | POA: Diagnosis not present

## 2022-01-19 DIAGNOSIS — Z79899 Other long term (current) drug therapy: Secondary | ICD-10-CM | POA: Insufficient documentation

## 2022-01-19 LAB — CMP (CANCER CENTER ONLY)
ALT: 21 U/L (ref 0–44)
AST: 30 U/L (ref 15–41)
Albumin: 4.1 g/dL (ref 3.5–5.0)
Alkaline Phosphatase: 106 U/L (ref 38–126)
Anion gap: 7 (ref 5–15)
BUN: 16 mg/dL (ref 8–23)
CO2: 25 mmol/L (ref 22–32)
Calcium: 9.8 mg/dL (ref 8.9–10.3)
Chloride: 104 mmol/L (ref 98–111)
Creatinine: 0.94 mg/dL (ref 0.61–1.24)
GFR, Estimated: >60 mL/min (ref >60)
Glucose, Bld: 102 mg/dL — ABNORMAL HIGH (ref 70–99)
Potassium: 3.8 mmol/L (ref 3.5–5.1)
Sodium: 136 mmol/L (ref 135–145)
Total Bilirubin: 0.9 mg/dL (ref 0.3–1.2)
Total Protein: 7.3 g/dL (ref 6.5–8.1)

## 2022-01-19 LAB — CBC WITH DIFFERENTIAL (CANCER CENTER ONLY)
Abs Immature Granulocytes: 0.06 10*3/uL (ref 0.00–0.07)
Basophils Absolute: 0 10*3/uL (ref 0.0–0.1)
Basophils Relative: 1 %
Eosinophils Absolute: 0.8 10*3/uL — ABNORMAL HIGH (ref 0.0–0.5)
Eosinophils Relative: 12 %
HCT: 40 % (ref 39.0–52.0)
Hemoglobin: 13.4 g/dL (ref 13.0–17.0)
Immature Granulocytes: 1 %
Lymphocytes Relative: 21 %
Lymphs Abs: 1.4 10*3/uL (ref 0.7–4.0)
MCH: 29.7 pg (ref 26.0–34.0)
MCHC: 33.5 g/dL (ref 30.0–36.0)
MCV: 88.7 fL (ref 80.0–100.0)
Monocytes Absolute: 0.7 10*3/uL (ref 0.1–1.0)
Monocytes Relative: 11 %
Neutro Abs: 3.6 10*3/uL (ref 1.7–7.7)
Neutrophils Relative %: 54 %
Platelet Count: 154 10*3/uL (ref 150–400)
RBC: 4.51 MIL/uL (ref 4.22–5.81)
RDW: 14.4 % (ref 11.5–15.5)
WBC Count: 6.5 10*3/uL (ref 4.0–10.5)
nRBC: 0 % (ref 0.0–0.2)

## 2022-01-19 NOTE — Progress Notes (Signed)
[facility] [phone]   Fax:([phone]) [phone]  OFFICE PROGRESS NOTE  [name], MD [address] [place] [address]  DIAGNOSIS: Recurrent/metastatic non-small cell lung cancer, adenocarcinoma initially diagnosed as a stage Ib (T2 a, N0, M0) adenocarcinoma in September 2020 status post wedge resection of the left upper lobe nodule.  The patient has disease recurrence and September 2022 with metastatic mediastinal lymphadenopathy as well as left hilar and left pleural metastatic disease   PD-L1 expression was negative.   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  First dose April 30, 2021. Status post 2 cycles. Discontinued due to molecular studies positive for EGFR mutation   CURRENT THERAPY: Tarceva 150 mg p.o. daily started June 28, 2021.  Status post 2 month of treatment.  His dose was changed to Tarceva 100 mg p.o. daily on 09/04/2021 secondary to intolerance and frequent skin rash.  Status post 4 months of treatment.  INTERVAL HISTORY: Vincent Velez 81 y.o. male returns to the clinic today for follow-up visit.  The patient is feeling fine today with no concerning complaints except for intermittent episodes of diarrhea few times a week and he uses Imodium on as-needed basis. The patient has no chest pain, shortness of breath, cough or hemoptysis.  He denied having any fever or chills.  He has no nausea, vomiting, abdominal pain or constipation.  He has no recent weight loss or night sweats.  He continues to tolerate his treatment with Tarceva fairly well except for the diarrhea.  He is here today  for evaluation and repeat blood work.  MEDICAL HISTORY: Past Medical History:  Diagnosis Date   AAA (abdominal aortic aneurysm) ([place])    Anxiety    Arthritis    Asthma    when younger   Atrial fibrillation (HCC)    CAD (coronary artery disease)    Cataract    removed both   COPD (chronic obstructive pulmonary disease) (HCC)    Diabetes mellitus    Type II   Dysrhythmia    afib   Esophageal reflux    Family history of malignant neoplasm of gastrointestinal tract    Heart murmur    Hiatal hernia    Hyperlipemia    Hypertension    Lung nodule    a. PET scan highly suspicious for malignancy. Bronch with biopsy to be done after TAVR   Malignant neoplasm of prostate [facility])    prostate    Peripheral vascular disease ([place])    Pneumonia    Stricture and stenosis of esophagus     ALLERGIES:  is allergic to macrolides and ketolides.  MEDICATIONS:  Current Outpatient Medications  Medication Sig Dispense Refill   ACCU-CHEK GUIDE test strip USE TO CHECK YOUR BLOOD SUGAR ONCE DAILY     acetaminophen (TYLENOL) 500 MG tablet Take 2 tablets (1,000 mg total) by mouth every 6 (six) hours as needed for mild pain or fever. 30 tablet 0   diltiazem (CARDIZEM SR) 60 MG 12 hr capsule Take 1 capsule (60 mg total) by mouth every 12 (twelve) hours. 60 capsule 7   dorzolamide-timolol (COSOPT) 22.3-6.8 MG/ML  ophthalmic solution Place 1 drop into both eyes in the morning, at noon, and at bedtime.     erlotinib (TARCEVA) 100 MG tablet Take 1 tablet (100 mg total) by mouth daily. Take on an empty stomach 1 hour before meals or 2 hours after. 30 tablet 3   glimepiride (AMARYL) 2 MG tablet Take 2 mg by mouth daily with breakfast.     JARDIANCE 25 MG TABS tablet Take 25 mg by mouth daily.     metFORMIN (GLUCOPHAGE-XR) 500 MG 24 hr tablet Take 1,000 mg by mouth every evening.      metoprolol succinate (TOPROL-XL) 50 MG 24 hr tablet Take 50 mg by mouth daily.     Multiple Vitamins-Minerals (MULTIVITAMIN WITH  MINERALS) tablet Take 1 tablet by mouth daily.     Multiple Vitamins-Minerals (ZINC PO) Take 1 tablet by mouth daily.     prochlorperazine (COMPAZINE) 10 MG tablet Take 1 tablet (10 mg total) by mouth every 6 (six) hours as needed for nausea or vomiting. (Patient not taking: Reported on 11/13/2021) 30 tablet 0   simvastatin (ZOCOR) 40 MG tablet Take 40 mg by mouth at bedtime.     XARELTO 20 MG TABS tablet TAKE 1 TABLET BY MOUTH  DAILY WITH SUPPER 90 tablet 1   No current facility-administered medications for this visit.    SURGICAL HISTORY:  Past Surgical History:  Procedure Laterality Date   ABDOMINAL AORTA STENT     CARDIAC CATHETERIZATION     CARDIOVERSION N/A 12/04/2015   Procedure: CARDIOVERSION;  Surgeon: Sanda Klein, MD;  Location: [facility];  Service: Cardiovascular;  Laterality: N/A;   COLONOSCOPY     FINGER SURGERY Right    middle finger- amputation   INSERTION PROSTATE RADIATION SEED     MEDIASTINOSCOPY N/A 01/06/2019   Procedure: MEDIASTINOSCOPY WITH BIOPIES;  Surgeon: Gaye Pollack, MD;  Location: [facility];  Service: Thoracic;  Laterality: N/A;   RIGHT/LEFT HEART CATH AND CORONARY ANGIOGRAPHY N/A 10/26/2018   Procedure: RIGHT/LEFT HEART CATH AND CORONARY ANGIOGRAPHY;  Surgeon: Burnell Blanks, MD;  Location: [facility];  Service: Cardiovascular;  Laterality: N/A;   TEE WITHOUT CARDIOVERSION N/A 12/06/2018   Procedure: TRANSESOPHAGEAL ECHOCARDIOGRAM (TEE);  Surgeon: Burnell Blanks, MD;  Location: [facility];  Service: Open Heart Surgery;  Laterality: N/A;   TRANSCATHETER AORTIC VALVE REPLACEMENT, TRANSFEMORAL N/A 12/06/2018   Procedure: TRANSCATHETER AORTIC VALVE REPLACEMENT, TRANSFEMORAL;  Surgeon: Burnell Blanks, MD;  Location: [facility];  Service: Open Heart Surgery;  Laterality: N/A;   UPPER GASTROINTESTINAL ENDOSCOPY     VIDEO ASSISTED THORACOSCOPY (VATS)/THOROCOTOMY Left 02/02/2019   Procedure: VIDEO ASSISTED THORACOSCOPY  (VATS)/THOROCOTOMY;  Surgeon: Gaye Pollack, MD;  Location: [facility];  Service: Thoracic;  Laterality: Left;   VIDEO BRONCHOSCOPY N/A 01/06/2019   Procedure: VIDEO BRONCHOSCOPY;  Surgeon: Gaye Pollack, MD;  Location: [facility];  Service: Thoracic;  Laterality: N/A;   VIDEO BRONCHOSCOPY WITH ENDOBRONCHIAL ULTRASOUND N/A 03/07/2021   Procedure: VIDEO BRONCHOSCOPY WITH ENDOBRONCHIAL ULTRASOUND;  Surgeon: [name], MD;  Location: [facility];  Service: Thoracic;  Laterality: N/A;   WEDGE RESECTION Left 02/02/2019   Procedure: LUNG RESECTION;  Surgeon: Gaye Pollack, MD;  [facility];  Service: Thoracic;  Laterality: Left;    REVIEW OF SYSTEMS:  A comprehensive review of systems was negative except for: Gastrointestinal: positive for diarrhea   PHYSICAL EXAMINATION: General appearance: alert, cooperative, and no distress Head: Normocephalic, without obvious abnormality, atraumatic Neck:  no adenopathy, no JVD, supple, symmetrical, trachea midline, and thyroid not enlarged, symmetric, no tenderness/mass/nodules Lymph nodes: Cervical, supraclavicular, and axillary nodes normal. Resp: clear to auscultation bilaterally Back: symmetric, no curvature. ROM normal. No CVA tenderness. Cardio: irregularly irregular rhythm GI: soft, non-tender; bowel sounds normal; no masses,  no organomegaly Extremities: extremities normal, atraumatic, no cyanosis or edema  ECOG PERFORMANCE STATUS: 1 - Symptomatic but completely ambulatory  Blood pressure 107/72, pulse 74, temperature 97.8 F (36.6 C), temperature source Tympanic, weight 162 lb 3.2 oz (73.6 kg), SpO2 96 %.  LABORATORY DATA: Lab Results  Component Value Date   WBC 6.5 01/19/2022   HGB 13.4 01/19/2022   HCT 40.0 01/19/2022   MCV 88.7 01/19/2022   PLT 154 01/19/2022      Chemistry      Component Value Date/Time   NA 136 12/12/2021 1010   NA 138 12/06/2019 1438   K 4.2 12/12/2021 1010   CL 104 12/12/2021 1010   CO2 24 12/12/2021 1010    BUN 16 12/12/2021 1010   BUN 14 12/06/2019 1438   CREATININE 0.89 12/12/2021 1010   CREATININE 0.81 11/29/2015 1335      Component Value Date/Time   CALCIUM 9.7 12/12/2021 1010   ALKPHOS 114 12/12/2021 1010   AST 20 12/12/2021 1010   ALT 16 12/12/2021 1010   BILITOT 1.0 12/12/2021 1010       RADIOGRAPHIC STUDIES: No results found.  ASSESSMENT AND PLAN:  This is a very pleasant 81 years old white male recently diagnosed with recurrent/metastatic non-small cell lung cancer, adenocarcinoma that was initially diagnosed as stage Ib (T2 a, N0, M0) adenocarcinoma in September 2020 status post wedge resection of the left lower lobe lung nodule and he has recurrence in September 2022 presented with metastatic mediastinal lymphadenopathy in addition to left hilar and left pleural metastatic disease. The patient had molecular studies by foundation 1 but there was insufficient material for the molecular test.  His PD-L1 expression was negative.  I will ask the pathology department to send his original resected tumor in 2020 for molecular studies as it may have enough tissue for testing. The patient started systemic chemotherapy with carboplatin for AUC of 5, Alimta 500 Mg/M2 and Keytruda 200 Mg IV every 3 weeks on April 30, 2021.  Status post 2 cycles.  He had evidence for disease progression after the first 2 cycles of his treatment. He had repeat CT scan of the chest that showed progressive metastatic disease throughout the pleura of the left hemothorax with new right hilar lymphadenopathy and increasing prevascular lymphadenopathy.  His treatment with chemotherapy was discontinued. Molecular studies from his resected tumor in September 2020 showed positive EGFR mutation, exon 23. The patient started treatment with Tarceva 150 mg p.o. daily on June 28, 2021.  He was not a good candidate for treatment with Tagrisso because of prolonged QT interval and concern about arrhythmia with Tagrisso. He has  been on treatment with Tarceva 100 mg p.o. daily status post 4 months. The patient has been tolerating this treatment fairly well except for few episodes of diarrhea improved with Imodium. I recommended for him to continue with the same dose of Tarceva 100 mg p.o. daily. I will see him back for follow-up visit in 1 months for evaluation and repeat blood work. For the skin rash she will continue to apply hydrocortisone cream on as-needed basis. The patient was advised to call immediately if he has any other concerning symptoms in the interval. The patient  voices understanding of current disease status and treatment options and is in agreement with the current care plan.  All questions were answered. The patient knows to call the clinic with any problems, questions or concerns. We can certainly see the patient much sooner if necessary.  Disclaimer: This note was dictated with voice recognition software. Similar sounding words can inadvertently be transcribed and may not be corrected upon review.

## 2022-01-20 ENCOUNTER — Other Ambulatory Visit (HOSPITAL_COMMUNITY): Payer: Self-pay

## 2022-01-21 ENCOUNTER — Other Ambulatory Visit: Payer: Self-pay

## 2022-01-21 ENCOUNTER — Other Ambulatory Visit (HOSPITAL_COMMUNITY): Payer: Self-pay

## 2022-01-26 ENCOUNTER — Telehealth: Payer: Self-pay | Admitting: Physician Assistant

## 2022-01-26 NOTE — Telephone Encounter (Signed)
Scheduled per 08/22 [facility], spoke with patient's daughter. Patient will be notified.

## 2022-01-30 ENCOUNTER — Other Ambulatory Visit: Payer: Self-pay

## 2022-02-14 NOTE — Progress Notes (Unsigned)
[name] OFFICE PROGRESS NOTE  [name], MD [address] [place] [address]  DIAGNOSIS: Recurrent/metastatic non-small cell lung cancer, adenocarcinoma initially diagnosed as a stage Ib (T2 a, N0, M0) adenocarcinoma in September 2020 status post wedge resection of the left upper lobe nodule.  The patient has disease recurrence and September 2022 with metastatic mediastinal lymphadenopathy as well as left hilar and left pleural metastatic disease   PD-L1 expression was negative.   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  First dose April 30, 2021. Status post 2 cycles. Discontinued due to molecular studies positive for EGFR mutation  CURRENT THERAPY: Tarceva 150 mg p.o. daily started June 28, 2021.  Status post 2 month of treatment.  His dose was changed to Tarceva 100 mg p.o. daily on 09/04/2021 secondary to intolerance and frequent skin rash.  Status post *** months of treatment.  INTERVAL HISTORY: Vincent Velez 81 y.o. male returns to clinic today for follow-up visit.  The patient was last seen on 01/19/2022.  The patient is currently undergoing targeted treatment with dose reduced Tarceva which she is tolerating fair except for intermittent diarrhea which is manageable with Imodium as needed.  Rash?  Today he denies any changes in his health.  Denies any fever, chills, night sweats, or unexplained weight loss.  Denies any chest pain, cough, or hemoptysis.  Baseline dyspnea on exertion.  Denies any nausea, vomiting, abdominal pain, or constipation.  Denies any headache or visual changes.  He is here today for  evaluation and repeat blood work      MEDICAL HISTORY: Past Medical History:  Diagnosis Date   AAA (abdominal aortic aneurysm) ([facility])    Anxiety    Arthritis    Asthma    when younger   Atrial fibrillation (H[facility])    CAD (coronary artery disease)    Cataract    removed both   COPD (chronic obstructive pulmonary disease) (H[facility])    Diabetes mellitus    Type II   Dysrhythmia    afib   Esophageal reflux    Family history of malignant neoplasm of gastrointestinal tract    Heart murmur    Hiatal hernia    Hyperlipemia    Hypertension    Lung nodule    a. PET scan highly suspicious for malignancy. Bronch with biopsy to be done after TAVR   Malignant neoplasm of prostate [facility])    prostate    Peripheral vascular disease ([place])    Pneumonia    Stricture and stenosis of esophagus     ALLERGIES:  is allergic to macrolides and ketolides.  MEDICATIONS:  Current Outpatient Medications  Medication Sig Dispense Refill   ACCU-CHEK GUIDE test strip USE TO CHECK YOUR BLOOD SUGAR ONCE DAILY     acetaminophen (TYLENOL) 500 MG tablet Take 2 tablets (1,000 mg total) by mouth every 6 (six) hours as needed for mild pain or fever. 30 tablet 0   diltiazem (CARDIZEM SR) 60 MG 12 hr capsule Take 1 capsule (60 mg total) by mouth every 12 (twelve) hours. 60 capsule 7   dorzolamide-timolol (COSOPT) 22.3-6.8 MG/ML ophthalmic solution Place 1 drop into both  eyes in the morning, at noon, and at bedtime.     erlotinib (TARCEVA) 100 MG tablet Take 1 tablet (100 mg total) by mouth daily. Take on an empty stomach 1 hour before meals or 2 hours after. 30 tablet 3   glimepiride (AMARYL) 2 MG tablet Take 2 mg by mouth daily with breakfast.     JARDIANCE 25 MG TABS tablet Take 25 mg by mouth daily.     metFORMIN (GLUCOPHAGE-XR) 500 MG 24 hr tablet Take 1,000 mg by mouth every evening.      metoprolol succinate (TOPROL-XL) 50 MG 24 hr tablet Take 50 mg by mouth daily.     Multiple Vitamins-Minerals (MULTIVITAMIN  WITH MINERALS) tablet Take 1 tablet by mouth daily.     Multiple Vitamins-Minerals (ZINC PO) Take 1 tablet by mouth daily.     prochlorperazine (COMPAZINE) 10 MG tablet Take 1 tablet (10 mg total) by mouth every 6 (six) hours as needed for nausea or vomiting. (Patient not taking: Reported on 11/13/2021) 30 tablet 0   simvastatin (ZOCOR) 40 MG tablet Take 40 mg by mouth at bedtime.     XARELTO 20 MG TABS tablet TAKE 1 TABLET BY MOUTH  DAILY WITH SUPPER 90 tablet 1   No current facility-administered medications for this visit.    SURGICAL HISTORY:  Past Surgical History:  Procedure Laterality Date   ABDOMINAL AORTA STENT     CARDIAC CATHETERIZATION     CARDIOVERSION N/A 12/04/2015   Procedure: CARDIOVERSION;  Surgeon: Sanda Klein, MD;  Location: [facility];  Service: Cardiovascular;  Laterality: N/A;   COLONOSCOPY     FINGER SURGERY Right    middle finger- amputation   INSERTION PROSTATE RADIATION SEED     MEDIASTINOSCOPY N/A 01/06/2019   Procedure: MEDIASTINOSCOPY WITH BIOPIES;  Surgeon: Gaye Pollack, MD;  Location: [facility];  Service: Thoracic;  Laterality: N/A;   RIGHT/LEFT HEART CATH AND CORONARY ANGIOGRAPHY N/A 10/26/2018   Procedure: RIGHT/LEFT HEART CATH AND CORONARY ANGIOGRAPHY;  Surgeon: Burnell Blanks, MD;  Location: [facility];  Service: Cardiovascular;  Laterality: N/A;   TEE WITHOUT CARDIOVERSION N/A 12/06/2018   Procedure: TRANSESOPHAGEAL ECHOCARDIOGRAM (TEE);  Surgeon: Burnell Blanks, MD;  Location: [facility];  Service: Open Heart Surgery;  Laterality: N/A;   TRANSCATHETER AORTIC VALVE REPLACEMENT, TRANSFEMORAL N/A 12/06/2018   Procedure: TRANSCATHETER AORTIC VALVE REPLACEMENT, TRANSFEMORAL;  Surgeon: Burnell Blanks, MD;  Location: [facility];  Service: Open Heart Surgery;  Laterality: N/A;   UPPER GASTROINTESTINAL ENDOSCOPY     VIDEO ASSISTED THORACOSCOPY (VATS)/THOROCOTOMY Left 02/02/2019   Procedure: VIDEO ASSISTED  THORACOSCOPY (VATS)/THOROCOTOMY;  Surgeon: Gaye Pollack, MD;  Location: [facility];  Service: Thoracic;  Laterality: Left;   VIDEO BRONCHOSCOPY N/A 01/06/2019   Procedure: VIDEO BRONCHOSCOPY;  Surgeon: Gaye Pollack, MD;  Location: [facility];  Service: Thoracic;  Laterality: N/A;   VIDEO BRONCHOSCOPY WITH ENDOBRONCHIAL ULTRASOUND N/A 03/07/2021   Procedure: VIDEO BRONCHOSCOPY WITH ENDOBRONCHIAL ULTRASOUND;  Surgeon: [name], MD;  Location: [facility];  Service: Thoracic;  Laterality: N/A;   WEDGE RESECTION Left 02/02/2019   Procedure: LUNG RESECTION;  Surgeon: Gaye Pollack, MD;  [facility];  Service: Thoracic;  Laterality: Left;    REVIEW OF SYSTEMS:   Review of Systems  Constitutional: Negative for appetite change, chills, fatigue, fever and unexpected weight change.  HENT:   Negative for mouth sores, nosebleeds, sore throat and trouble swallowing.   Eyes: Negative for eye problems  and icterus.  Respiratory: Negative for cough, hemoptysis, shortness of breath and wheezing.   Cardiovascular: Negative for chest pain and leg swelling.  Gastrointestinal: Negative for abdominal pain, constipation, diarrhea, nausea and vomiting.  Genitourinary: Negative for bladder incontinence, difficulty urinating, dysuria, frequency and hematuria.   Musculoskeletal: Negative for back pain, gait problem, neck pain and neck stiffness.  Skin: Negative for itching and rash.  Neurological: Negative for dizziness, extremity weakness, gait problem, headaches, light-headedness and seizures.  Hematological: Negative for adenopathy. Does not bruise/bleed easily.  Psychiatric/Behavioral: Negative for confusion, depression and sleep disturbance. The patient is not nervous/anxious.     PHYSICAL EXAMINATION:  There were no vitals taken for this visit.  ECOG PERFORMANCE STATUS: {CHL ONC ECOG [id number]  Physical Exam  Constitutional: Oriented to person, place, and time and well-developed, well-nourished,  and in no distress. No distress.  HENT:  Head: Normocephalic and atraumatic.  Mouth/Throat: Oropharynx is clear and moist. No oropharyngeal exudate.  Eyes: Conjunctivae are normal. Right eye exhibits no discharge. Left eye exhibits no discharge. No scleral icterus.  Neck: Normal range of motion. Neck supple.  Cardiovascular: Normal rate, regular rhythm, normal heart sounds and intact distal pulses.   Pulmonary/Chest: Effort normal and breath sounds normal. No respiratory distress. No wheezes. No rales.  Abdominal: Soft. Bowel sounds are normal. Exhibits no distension and no mass. There is no tenderness.  Musculoskeletal: Normal range of motion. Exhibits no edema.  Lymphadenopathy:    No cervical adenopathy.  Neurological: Alert and oriented to person, place, and time. Exhibits normal muscle tone. Gait normal. Coordination normal.  Skin: Skin is warm and dry. No rash noted. Not diaphoretic. No erythema. No pallor.  Psychiatric: Mood, memory and judgment normal.  Vitals reviewed.  LABORATORY DATA: Lab Results  Component Value Date   WBC 6.5 01/19/2022   HGB 13.4 01/19/2022   HCT 40.0 01/19/2022   MCV 88.7 01/19/2022   PLT 154 01/19/2022      Chemistry      Component Value Date/Time   NA 136 01/19/2022 1444   NA 138 12/06/2019 1438   K 3.8 01/19/2022 1444   CL 104 01/19/2022 1444   CO2 25 01/19/2022 1444   BUN 16 01/19/2022 1444   BUN 14 12/06/2019 1438   CREATININE 0.94 01/19/2022 1444   CREATININE 0.81 11/29/2015 1335      Component Value Date/Time   CALCIUM 9.8 01/19/2022 1444   ALKPHOS 106 01/19/2022 1444   AST 30 01/19/2022 1444   ALT 21 01/19/2022 1444   BILITOT 0.9 01/19/2022 1444       RADIOGRAPHIC STUDIES:  No results found.   ASSESSMENT/PLAN:   This is a very pleasant 81 year old Caucasian male diagnosed with recurrent/metastatic non-small cell lung cancer, adenocarcinoma that was initially diagnosed as stage Ib (T2 a, N0, M0) adenocarcinoma in September  2020 status post wedge resection of the left lower lobe lung nodule and he had recurrence in September 2022 presented with metastatic mediastinal lymphadenopathy in addition to left hilar and left pleural metastatic disease.  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   Patient underwent 2 cycles of systemic palliative chemotherapy with carboplatin AUC of 5 and Alimta 500 mg per metered squared and Keytruda 200 mg IV every 3 weeks.  He received 2 cycles.  This will be discontinued due to receiving the results of his molecular studies which show EGFR mutation.  He was started on treatment with Tarceva  150 mg p.o. daily on 06/28/2021.  He was not a good candidate for treatment aggressive because of his prolonged QT and concern about arrhythmia while on Xarelto.  His dose was reduced to 100 mg p.o. daily of Tarceva status post ***months of treatment.  He is tolerating this well except for manageable diarrhea.  The patient was seen with Dr. Julien Nordmann today.  Labs were reviewed.  Recommend that he continue on the same treatment at the same dose.  I will arrange for restaging CT scan of the chest, abdomen, pelvis prior to his next appointment in 1 month.  We will see him back for follow-up visit a few days after his scan to review the results.  Rash?      No orders of the defined types were placed in this encounter.    I spent {CHL ONC TIME VISIT - WNIOE:[id number]} counseling the patient face to face. The total time spent in the appointment was {CHL ONC TIME VISIT - WEXHB:[id number]}.  Tyvion Edmondson L Latwan Luchsinger, PA-C 02/14/22

## 2022-02-16 ENCOUNTER — Inpatient Hospital Stay (HOSPITAL_BASED_OUTPATIENT_CLINIC_OR_DEPARTMENT_OTHER): Payer: Medicare Other | Admitting: Physician Assistant

## 2022-02-16 ENCOUNTER — Inpatient Hospital Stay: Payer: Medicare Other | Attending: Physician Assistant

## 2022-02-16 ENCOUNTER — Encounter: Payer: Self-pay | Admitting: Physician Assistant

## 2022-02-16 ENCOUNTER — Other Ambulatory Visit: Payer: Self-pay

## 2022-02-16 VITALS — BP 103/61 | HR 80 | Temp 97.8°F | Resp 16 | Ht 69.0 in | Wt 161.9 lb

## 2022-02-16 DIAGNOSIS — Z800 Family history of malignant neoplasm of digestive organs: Secondary | ICD-10-CM | POA: Insufficient documentation

## 2022-02-16 DIAGNOSIS — R21 Rash and other nonspecific skin eruption: Secondary | ICD-10-CM | POA: Insufficient documentation

## 2022-02-16 DIAGNOSIS — Z8546 Personal history of malignant neoplasm of prostate: Secondary | ICD-10-CM | POA: Insufficient documentation

## 2022-02-16 DIAGNOSIS — Z7901 Long term (current) use of anticoagulants: Secondary | ICD-10-CM | POA: Diagnosis not present

## 2022-02-16 DIAGNOSIS — Z79899 Other long term (current) drug therapy: Secondary | ICD-10-CM | POA: Diagnosis not present

## 2022-02-16 DIAGNOSIS — R5383 Other fatigue: Secondary | ICD-10-CM | POA: Insufficient documentation

## 2022-02-16 DIAGNOSIS — R590 Localized enlarged lymph nodes: Secondary | ICD-10-CM | POA: Diagnosis not present

## 2022-02-16 DIAGNOSIS — R197 Diarrhea, unspecified: Secondary | ICD-10-CM | POA: Diagnosis not present

## 2022-02-16 DIAGNOSIS — R0609 Other forms of dyspnea: Secondary | ICD-10-CM | POA: Diagnosis not present

## 2022-02-16 DIAGNOSIS — C782 Secondary malignant neoplasm of pleura: Secondary | ICD-10-CM | POA: Insufficient documentation

## 2022-02-16 DIAGNOSIS — C3492 Malignant neoplasm of unspecified part of left bronchus or lung: Secondary | ICD-10-CM | POA: Diagnosis not present

## 2022-02-16 DIAGNOSIS — C3490 Malignant neoplasm of unspecified part of unspecified bronchus or lung: Secondary | ICD-10-CM

## 2022-02-16 DIAGNOSIS — C3412 Malignant neoplasm of upper lobe, left bronchus or lung: Secondary | ICD-10-CM | POA: Insufficient documentation

## 2022-02-16 DIAGNOSIS — Z923 Personal history of irradiation: Secondary | ICD-10-CM | POA: Diagnosis not present

## 2022-02-16 DIAGNOSIS — R519 Headache, unspecified: Secondary | ICD-10-CM | POA: Diagnosis not present

## 2022-02-16 LAB — CBC WITH DIFFERENTIAL (CANCER CENTER ONLY)
Abs Immature Granulocytes: 0.06 10*3/uL (ref 0.00–0.07)
Basophils Absolute: 0.1 10*3/uL (ref 0.0–0.1)
Basophils Relative: 1 %
Eosinophils Absolute: 0.6 10*3/uL — ABNORMAL HIGH (ref 0.0–0.5)
Eosinophils Relative: 8 %
HCT: 39.4 % (ref 39.0–52.0)
Hemoglobin: 13.2 g/dL (ref 13.0–17.0)
Immature Granulocytes: 1 %
Lymphocytes Relative: 19 %
Lymphs Abs: 1.4 10*3/uL (ref 0.7–4.0)
MCH: 30 pg (ref 26.0–34.0)
MCHC: 33.5 g/dL (ref 30.0–36.0)
MCV: 89.5 fL (ref 80.0–100.0)
Monocytes Absolute: 0.7 10*3/uL (ref 0.1–1.0)
Monocytes Relative: 9 %
Neutro Abs: 4.7 10*3/uL (ref 1.7–7.7)
Neutrophils Relative %: 62 %
Platelet Count: 139 10*3/uL — ABNORMAL LOW (ref 150–400)
RBC: 4.4 MIL/uL (ref 4.22–5.81)
RDW: 15.2 % (ref 11.5–15.5)
WBC Count: 7.6 10*3/uL (ref 4.0–10.5)
nRBC: 0 % (ref 0.0–0.2)

## 2022-02-16 LAB — CMP (CANCER CENTER ONLY)
ALT: 19 U/L (ref 0–44)
AST: 25 U/L (ref 15–41)
Albumin: 4 g/dL (ref 3.5–5.0)
Alkaline Phosphatase: 115 U/L (ref 38–126)
Anion gap: 12 (ref 5–15)
BUN: 21 mg/dL (ref 8–23)
CO2: 20 mmol/L — ABNORMAL LOW (ref 22–32)
Calcium: 9.4 mg/dL (ref 8.9–10.3)
Chloride: 104 mmol/L (ref 98–111)
Creatinine: 0.96 mg/dL (ref 0.61–1.24)
GFR, Estimated: >60 mL/min (ref >60)
Glucose, Bld: 230 mg/dL — ABNORMAL HIGH (ref 70–99)
Potassium: 3.9 mmol/L (ref 3.5–5.1)
Sodium: 136 mmol/L (ref 135–145)
Total Bilirubin: 1 mg/dL (ref 0.3–1.2)
Total Protein: 7.3 g/dL (ref 6.5–8.1)

## 2022-02-18 ENCOUNTER — Other Ambulatory Visit: Payer: Self-pay

## 2022-02-19 ENCOUNTER — Ambulatory Visit: Payer: Medicare Other | Admitting: Physician Assistant

## 2022-02-19 ENCOUNTER — Other Ambulatory Visit: Payer: Medicare Other

## 2022-02-24 ENCOUNTER — Other Ambulatory Visit (HOSPITAL_COMMUNITY): Payer: Self-pay

## 2022-02-27 ENCOUNTER — Other Ambulatory Visit (HOSPITAL_COMMUNITY): Payer: Self-pay

## 2022-03-02 ENCOUNTER — Telehealth: Payer: Self-pay

## 2022-03-02 ENCOUNTER — Other Ambulatory Visit: Payer: Self-pay

## 2022-03-02 NOTE — Telephone Encounter (Signed)
Pts daughter LVM stating pts CT has not been schedule and that he is experiencing dizziness and nausea.  I have attempted to reach the pt but there was no answer. I did not leave him a VM. I also then called his daughter back. She did not answer, so I left her a detailed message advising that she needs to contact Centralized Scheduling for the CT and to please return my call so we can discuss the pts sx in further detail.

## 2022-03-12 ENCOUNTER — Ambulatory Visit (HOSPITAL_COMMUNITY)
Admission: RE | Admit: 2022-03-12 | Discharge: 2022-03-12 | Disposition: A | Discharge status: Home / Self Care | Payer: Medicare Other | Source: Ambulatory Visit | Attending: Physician Assistant | Admitting: Physician Assistant

## 2022-03-12 DIAGNOSIS — N281 Cyst of kidney, acquired: Secondary | ICD-10-CM | POA: Diagnosis not present

## 2022-03-12 DIAGNOSIS — J479 Bronchiectasis, uncomplicated: Secondary | ICD-10-CM | POA: Diagnosis not present

## 2022-03-12 DIAGNOSIS — C3492 Malignant neoplasm of unspecified part of left bronchus or lung: Secondary | ICD-10-CM | POA: Insufficient documentation

## 2022-03-12 DIAGNOSIS — R911 Solitary pulmonary nodule: Secondary | ICD-10-CM | POA: Diagnosis not present

## 2022-03-12 DIAGNOSIS — N2889 Other specified disorders of kidney and ureter: Secondary | ICD-10-CM | POA: Diagnosis not present

## 2022-03-12 MED ORDER — IOHEXOL 300 MG/ML  SOLN
100.0000 mL | Freq: Once | INTRAMUSCULAR | Status: AC | PRN
  Administered 2022-03-12: 100 mL via INTRAVENOUS

## 2022-03-12 MED ORDER — SODIUM CHLORIDE (PF) 0.9 % IJ SOLN
INTRAMUSCULAR | Status: AC
  Filled 2022-03-12: qty 50

## 2022-03-12 NOTE — Progress Notes (Unsigned)
Vincent Velez  [name], MD [place] [address] [place] [address]  DIAGNOSIS: Recurrent/metastatic non-small cell lung cancer, adenocarcinoma initially diagnosed as a stage Ib (T2 a, N0, M0) adenocarcinoma in September 2020 status post wedge resection of the left upper lobe nodule.  The patient has disease recurrence and September 2022 with metastatic mediastinal lymphadenopathy as well as left hilar and left pleural metastatic disease   PD-L1 expression was negative.   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  First dose April 30, 2021. Status post 2 cycles. Discontinued due to molecular studies positive for EGFR mutation  CURRENT THERAPY: Tarceva 150 mg p.o. daily started June 28, 2021.  Status post 2 month of treatment.  His dose was changed to Tarceva 100 mg p.o. daily on 09/04/2021 secondary to intolerance and frequent skin rash.  Status post 8.5 months of treatment.  INTERVAL HISTORY: Vincent Velez 81 y.o. male returns to the clinic today for a follow-up visit.  The patient was last seen in the clinic on 02/16/2022.  The patient is currently undergoing targeted treatment with dose reduced Tarceva.  He is tolerating the dose reduction well he has some intermittent diarrhea which is manageable with two tablets of Imodium if needed.  He no longer has a rash with being on dose reduced Tarceva.    He reports increased dizziness since last visit. He also reports feeling "wobbly."  He reports typical bouts of dizziness lasting about 15 seconds, but reports one occurrence a couple of weeks ago which required  20-30 minutes of rest in the car before recovery. He reports this was when he was working outside and is unsure if he over worked during that time frame. He states it was not very hot outside. He also felt nauseous after this occurred. He denies loss of consciousness. He had a valve replacement in the past for hx aortic stenosis. He denies any new weakness. He additionally reports blurry vision that worsened after the occurrence. He states it is worse in his left eye but notes long-standing issues with that eye.  Denies any fever, chills, or night sweats, weight loss. He reports his breathing is "okay".  He denies dyspnea, chest pain. He reports his coughing has increased and he is coughing up phlegm. He denies hemoptysis.  Denies any vomiting, or constipation. Reports nausea with certain foods. Reports he is still able to eat well although does not taste as much.   He recently had a restaging CT scan performed.  He is here today for evaluation and to review his scan results.    MEDICAL HISTORY: Past Medical History:  Diagnosis Date   AAA (abdominal aortic aneurysm) ([facility])    Anxiety    Arthritis    Asthma    when younger   Atrial fibrillation ([facility])    CAD (coronary artery disease)    Cataract    removed both   COPD (chronic obstructive pulmonary disease) ([facility])    Diabetes mellitus    Type II   Dysrhythmia    afib   Esophageal reflux    Family history of malignant neoplasm of gastrointestinal tract  Heart murmur    Hiatal hernia    Hyperlipemia    Hypertension    Lung nodule    a. PET scan highly suspicious for malignancy. Bronch with biopsy to be done after TAVR   Malignant neoplasm of prostate [facility])    prostate    Peripheral vascular disease ([facility])    Pneumonia    Stricture and stenosis of esophagus     ALLERGIES:  is allergic to macrolides and ketolides.  MEDICATIONS:  Current Outpatient Medications  Medication Sig Dispense Refill   ACCU-CHEK GUIDE test strip USE TO  CHECK YOUR BLOOD SUGAR ONCE DAILY     acetaminophen (TYLENOL) 500 MG tablet Take 2 tablets (1,000 mg total) by mouth every 6 (six) hours as needed for mild pain or fever. 30 tablet 0   diltiazem (CARDIZEM SR) 60 MG 12 hr capsule Take 1 capsule (60 mg total) by mouth every 12 (twelve) hours. 60 capsule 7   dorzolamide-timolol (COSOPT) 22.3-6.8 MG/ML ophthalmic solution Place 1 drop into both eyes in the morning, at noon, and at bedtime.     erlotinib (TARCEVA) 100 MG tablet Take 1 tablet (100 mg total) by mouth daily. Take on an empty stomach 1 hour before meals or 2 hours after. 30 tablet 3   glimepiride (AMARYL) 2 MG tablet Take 2 mg by mouth daily with breakfast.     JARDIANCE 25 MG TABS tablet Take 25 mg by mouth daily.     metFORMIN (GLUCOPHAGE-XR) 500 MG 24 hr tablet Take 1,000 mg by mouth every evening.      metoprolol succinate (TOPROL-XL) 25 MG 24 hr tablet Take 50 mg by mouth daily.     Multiple Vitamins-Minerals (MULTIVITAMIN WITH MINERALS) tablet Take 1 tablet by mouth daily.     Multiple Vitamins-Minerals (ZINC PO) Take 1 tablet by mouth daily.     prochlorperazine (COMPAZINE) 10 MG tablet Take 1 tablet (10 mg total) by mouth every 6 (six) hours as needed for nausea or vomiting. (Patient not taking: Reported on 11/13/2021) 30 tablet 0   simvastatin (ZOCOR) 40 MG tablet Take 40 mg by mouth at bedtime.     XARELTO 20 MG TABS tablet TAKE 1 TABLET BY MOUTH  DAILY WITH SUPPER 90 tablet 1   No current facility-administered medications for this visit.    SURGICAL HISTORY:  Past Surgical History:  Procedure Laterality Date   ABDOMINAL AORTA STENT     CARDIAC CATHETERIZATION     CARDIOVERSION N/A 12/04/2015   Procedure: CARDIOVERSION;  Surgeon: Sanda Klein, MD;  Location: [facility];  Service: Cardiovascular;  Laterality: N/A;   COLONOSCOPY     FINGER SURGERY Right    middle finger- amputation   INSERTION PROSTATE RADIATION SEED     MEDIASTINOSCOPY N/A 01/06/2019   Procedure:  MEDIASTINOSCOPY WITH BIOPIES;  Surgeon: Gaye Pollack, MD;  Location: [facility];  Service: Thoracic;  Laterality: N/A;   RIGHT/LEFT HEART CATH AND CORONARY ANGIOGRAPHY N/A 10/26/2018   Procedure: RIGHT/LEFT HEART CATH AND CORONARY ANGIOGRAPHY;  Surgeon: Burnell Blanks, MD;  Location: [facility];  Service: Cardiovascular;  Laterality: N/A;   TEE WITHOUT CARDIOVERSION N/A 12/06/2018   Procedure: TRANSESOPHAGEAL ECHOCARDIOGRAM (TEE);  Surgeon: Burnell Blanks, MD;  Location: [facility];  Service: Open Heart Surgery;  Laterality: N/A;   TRANSCATHETER AORTIC VALVE REPLACEMENT, TRANSFEMORAL N/A 12/06/2018   Procedure: TRANSCATHETER AORTIC VALVE REPLACEMENT, TRANSFEMORAL;  Surgeon: Burnell Blanks, MD;  Location: [facility];  Service: Open Heart  Surgery;  Laterality: N/A;   UPPER GASTROINTESTINAL ENDOSCOPY     VIDEO ASSISTED THORACOSCOPY (VATS)/THOROCOTOMY Left 02/02/2019   Procedure: VIDEO ASSISTED THORACOSCOPY (VATS)/THOROCOTOMY;  Surgeon: Gaye Pollack, MD;  Location: [facility];  Service: Thoracic;  Laterality: Left;   VIDEO BRONCHOSCOPY N/A 01/06/2019   Procedure: VIDEO BRONCHOSCOPY;  Surgeon: Gaye Pollack, MD;  Location: [facility];  Service: Thoracic;  Laterality: N/A;   VIDEO BRONCHOSCOPY WITH ENDOBRONCHIAL ULTRASOUND N/A 03/07/2021   Procedure: VIDEO BRONCHOSCOPY WITH ENDOBRONCHIAL ULTRASOUND;  Surgeon: [name], MD;  Location: [facility];  Service: Thoracic;  Laterality: N/A;   WEDGE RESECTION Left 02/02/2019   Procedure: LUNG RESECTION;  Surgeon: Gaye Pollack, MD;  Location: [facility];  Service: Thoracic;  Laterality: Left;    REVIEW OF SYSTEMS:   Review of Systems  Constitutional: Negative for appetite change, chills, fatigue, fever and unexpected weight change. POS FOR DIZZY  HENT:   Negative for mouth sores, nosebleeds, sore throat and trouble swallowing.   Eyes: Negative for eye problems and icterus. POS FOR BLURRY VISION Respiratory: Negative foR  hemoptysis, shortness of breath and wheezing.  POS FOR COUGH Cardiovascular: Negative for chest pain and leg swelling.  Gastrointestinal: Negative for abdominal pain, constipation, diarrhea, nausea and vomiting.  Genitourinary: Negative for bladder incontinence, difficulty urinating, dysuria, frequency and hematuria.   Musculoskeletal: Negative for back pain, gait problem, neck pain and neck stiffness.  Skin: Negative for itching and rash.  Neurological: Negative for extremity weakness, gait problem, headaches, light-headedness and seizures.  Hematological: Negative for adenopathy. Does not bruise/bleed easily.  Psychiatric/Behavioral: Negative for confusion, depression and sleep disturbance. The patient is not nervous/anxious.     PHYSICAL EXAMINATION:  There were no vitals taken for this visit.  ECOG PERFORMANCE STATUS: 1  Physical Exam  Constitutional: Oriented to person, place, and time and well-developed, well-nourished, and in no distress.  HENT:  Head: Normocephalic and atraumatic.  Mouth/Throat: Oropharynx is clear and moist. No oropharyngeal exudate.  Eyes: Conjunctivae are normal. Right eye exhibits no discharge. Left eye exhibits no discharge. No scleral icterus.  Neck: Normal range of motion. Neck supple.  Cardiovascular: Normal rate, regular rhythm, normal heart sounds and intact distal pulses.   Pulmonary/Chest: Effort normal and breath sounds normal. No respiratory distress. No wheezes. No rales.  Abdominal: Soft. Bowel sounds are normal. Exhibits no distension and no mass. There is no tenderness.  Musculoskeletal: Normal range of motion. Exhibits no edema.  Lymphadenopathy:    No cervical adenopathy.  Neurological: Alert and oriented to person, place, and time. Exhibits normal muscle tone. Gait normal. Coordination normal. SLIGHT LEFT SIDED FACIAL WEAKNESS AND slight RIGHT Tongue DEVIATION. Strength otherwise symmetrical and wnl.  Skin: Skin is warm and dry. No rash  noted. Not diaphoretic. No erythema. No pallor.  Psychiatric: Mood, memory and judgment normal.  Vitals reviewed.  LABORATORY DATA: Lab Results  Component Value Date   WBC 7.6 02/16/2022   HGB 13.2 02/16/2022   HCT 39.4 02/16/2022   MCV 89.5 02/16/2022   PLT 139 (L) 02/16/2022      Chemistry      Component Value Date/Time   NA 136 02/16/2022 1428   NA 138 12/06/2019 1438   K 3.9 02/16/2022 1428   CL 104 02/16/2022 1428   CO2 20 (L) 02/16/2022 1428   BUN 21 02/16/2022 1428   BUN 14 12/06/2019 1438   CREATININE 0.96 02/16/2022 1428   CREATININE 0.81 11/29/2015 1335      Component Value Date/Time  CALCIUM 9.4 02/16/2022 1428   ALKPHOS 115 02/16/2022 1428   AST 25 02/16/2022 1428   ALT 19 02/16/2022 1428   BILITOT 1.0 02/16/2022 1428       RADIOGRAPHIC STUDIES:  No results found.   ASSESSMENT/PLAN:   This is a very pleasant 81 year old Caucasian male diagnosed with recurrent/metastatic non-small cell lung cancer, adenocarcinoma that was initially diagnosed as stage Ib (T2 a, N0, M0) adenocarcinoma in September 2020 status post wedge resection of the left lower lobe lung nodule and he had recurrence in September 2022 presented with metastatic mediastinal lymphadenopathy in addition to left hilar and left pleural metastatic disease.  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    He underwent 2 cycles of systemic palliative chemotherapy with carboplatin AUC of 5 and Alimta 500 mg per metered squared and Keytruda 200 mg IV every 3 weeks.  He received 2 cycles.  This was discontinued due to receiving the results of his molecular studies which show EGFR mutation.   He was started on treatment with Tarceva 150 mg p.o. daily on 06/28/2021.  He was not a good candidate for Tagrisso because of his prolonged QT and concern about arrhythmia while on Tagrisso.   His dose was reduced to 100 mg p.o. daily of Tarceva. His dose was  reduced in April 2023. He is tolerating this well except for manageable diarrhea.  Patient was seen with Dr. Julien Nordmann today.  The patient recently had a restaging CT scan performed.  Dr. Julien Nordmann personally and independently reviewed the scan and discussed results with the patient today.  This scan showed no definite findings to suggest locally recurrent or definite metastatic disease.  The lung showed some worsening interstitial disease, could represent some degree of pneumonitis.  Dr. Julien Nordmann recommends arranging for the patient to take a Medrol Dosepak.  I reviewed signs and symptoms of steroids with the patient including the need to monitor his blood sugar closely.  Reviewed signs and symptoms of hyperglycemia.  He was advised to take this first in the morning.  If he develops any new or worsening cough in the interval, advised to please call us sooner for reevaluation.  Due to the patient's new onset of dizziness few weeks ago, this could be related to overexertion doing yard work and heavy lifting; however, this was unusual for the patient he is also developing some visual changes and had some nausea.  Therefore we will arrange for a brain MRI to rule out metastatic disease to the brain.  I will call him with the results.  As long as there is no evidence of disease progression on imaging, we will see the patient back for follow-up visit in 1 month.  Of course if he has evidence of metastatic disease we will call him sooner for evaluation.   He will continue taking Imodium if needed for diarrhea.  The patient was advised to call immediately if he has any concerning symptoms in the interval. The patient voices understanding of current disease status and treatment options and is in agreement with the current care plan. All questions were answered. The patient knows to call the clinic with any problems, questions or concerns. We can certainly see the patient much sooner if necessary      No  orders of the defined types were placed in this encounter.     Tikesha Mort L Kobi Mario, PA-C 03/12/22  ADDENDUM: Hematology/Oncology Attending: I had a face-to-face  encounter with the patient today.  I reviewed his record, lab, scan and recommended his care plan.  This is a very pleasant 81 years old white male with recurrent and metastatic non-small cell lung cancer, adenocarcinoma that initially diagnosed as stage Ib in September 2020 status post left upper lobe wedge resection and the patient had recurrence in September 2022 with negative PD-L1 expression but positive EGFR mutation with exon 19 deletion. The patient is currently undergoing treatment with Tarceva 150 mg p.o. daily after initial treatment with systemic chemotherapy with carboplatin, Alimta and Keytruda that was discontinued after the molecular studies came back positive for EGFR mutation and we could not start treatment with Tagrisso at that time because of the concern for severe pneumonitis after immunotherapy. The patient has been on treatment with Tarceva since January 2023 and he is currently on 100 mg p.o. daily and tolerating it fairly well.  He has some dizzy spells recently while working in the yard.  He also has some generalized weakness and visual changes. He had repeat CT scan of the chest, abdomen and pelvis performed recently.  I personally and independently reviewed the scan images and discussed the result with the patient today. His scan showed no concerning findings for disease progression but there was increased airspace disease in the lungs bilaterally with some prominent right hilar adenopathy. I recommended for the patient to continue his current treatment with Tarceva with the same dose. For the airspace disease that could be drug-induced pneumonitis, I will start the patient on Medrol Dosepak for the next week and monitor it closely. For the dizzy spells and visual changes, we will arrange for the patient to  have MRI of the brain to rule out brain metastasis. The patient will come back for follow-up visit in 1 months for evaluation and repeat blood work unless there is any concerning findings on the MRI, we will see him sooner. He was advised to call immediately if he has any other concerning symptoms in the interval. The total time spent in the appointment was 30 minutes. Disclaimer: This Velez was dictated with voice recognition software. Similar sounding words can inadvertently be transcribed and may be missed upon review. Eilleen Kempf, MD

## 2022-03-13 ENCOUNTER — Other Ambulatory Visit: Payer: Self-pay | Admitting: Medical Oncology

## 2022-03-13 DIAGNOSIS — C3492 Malignant neoplasm of unspecified part of left bronchus or lung: Secondary | ICD-10-CM

## 2022-03-13 DIAGNOSIS — Z5111 Encounter for antineoplastic chemotherapy: Secondary | ICD-10-CM

## 2022-03-16 ENCOUNTER — Inpatient Hospital Stay: Payer: Medicare Other | Attending: Physician Assistant

## 2022-03-16 ENCOUNTER — Inpatient Hospital Stay (HOSPITAL_BASED_OUTPATIENT_CLINIC_OR_DEPARTMENT_OTHER): Payer: Medicare Other | Admitting: Physician Assistant

## 2022-03-16 ENCOUNTER — Other Ambulatory Visit: Payer: Self-pay

## 2022-03-16 VITALS — BP 118/83 | HR 87 | Temp 97.6°F | Resp 16 | Ht 69.0 in | Wt 160.7 lb

## 2022-03-16 DIAGNOSIS — Z79899 Other long term (current) drug therapy: Secondary | ICD-10-CM | POA: Diagnosis not present

## 2022-03-16 DIAGNOSIS — R197 Diarrhea, unspecified: Secondary | ICD-10-CM | POA: Diagnosis not present

## 2022-03-16 DIAGNOSIS — H538 Other visual disturbances: Secondary | ICD-10-CM | POA: Diagnosis not present

## 2022-03-16 DIAGNOSIS — J984 Other disorders of lung: Secondary | ICD-10-CM | POA: Diagnosis not present

## 2022-03-16 DIAGNOSIS — R42 Dizziness and giddiness: Secondary | ICD-10-CM | POA: Diagnosis not present

## 2022-03-16 DIAGNOSIS — Z923 Personal history of irradiation: Secondary | ICD-10-CM | POA: Insufficient documentation

## 2022-03-16 DIAGNOSIS — C3412 Malignant neoplasm of upper lobe, left bronchus or lung: Secondary | ICD-10-CM | POA: Insufficient documentation

## 2022-03-16 DIAGNOSIS — C3492 Malignant neoplasm of unspecified part of left bronchus or lung: Secondary | ICD-10-CM

## 2022-03-16 DIAGNOSIS — R21 Rash and other nonspecific skin eruption: Secondary | ICD-10-CM | POA: Diagnosis not present

## 2022-03-16 DIAGNOSIS — Z7901 Long term (current) use of anticoagulants: Secondary | ICD-10-CM | POA: Diagnosis not present

## 2022-03-16 DIAGNOSIS — Z8546 Personal history of malignant neoplasm of prostate: Secondary | ICD-10-CM | POA: Diagnosis not present

## 2022-03-16 DIAGNOSIS — C782 Secondary malignant neoplasm of pleura: Secondary | ICD-10-CM | POA: Insufficient documentation

## 2022-03-16 DIAGNOSIS — Z800 Family history of malignant neoplasm of digestive organs: Secondary | ICD-10-CM | POA: Diagnosis not present

## 2022-03-16 DIAGNOSIS — Z5111 Encounter for antineoplastic chemotherapy: Secondary | ICD-10-CM

## 2022-03-16 DIAGNOSIS — R110 Nausea: Secondary | ICD-10-CM | POA: Insufficient documentation

## 2022-03-16 LAB — CMP (CANCER CENTER ONLY)
ALT: 23 U/L (ref 0–44)
AST: 33 U/L (ref 15–41)
Albumin: 4 g/dL (ref 3.5–5.0)
Alkaline Phosphatase: 126 U/L (ref 38–126)
Anion gap: 8 (ref 5–15)
BUN: 21 mg/dL (ref 8–23)
CO2: 24 mmol/L (ref 22–32)
Calcium: 9.8 mg/dL (ref 8.9–10.3)
Chloride: 102 mmol/L (ref 98–111)
Creatinine: 0.95 mg/dL (ref 0.61–1.24)
GFR, Estimated: >60 mL/min (ref >60)
Glucose, Bld: 168 mg/dL — ABNORMAL HIGH (ref 70–99)
Potassium: 4.3 mmol/L (ref 3.5–5.1)
Sodium: 134 mmol/L — ABNORMAL LOW (ref 135–145)
Total Bilirubin: 0.9 mg/dL (ref 0.3–1.2)
Total Protein: 7.9 g/dL (ref 6.5–8.1)

## 2022-03-16 LAB — CBC WITH DIFFERENTIAL (CANCER CENTER ONLY)
Abs Immature Granulocytes: 0.06 10*3/uL (ref 0.00–0.07)
Basophils Absolute: 0.1 10*3/uL (ref 0.0–0.1)
Basophils Relative: 1 %
Eosinophils Absolute: 0.7 10*3/uL — ABNORMAL HIGH (ref 0.0–0.5)
Eosinophils Relative: 10 %
HCT: 43.1 % (ref 39.0–52.0)
Hemoglobin: 14.3 g/dL (ref 13.0–17.0)
Immature Granulocytes: 1 %
Lymphocytes Relative: 19 %
Lymphs Abs: 1.4 10*3/uL (ref 0.7–4.0)
MCH: 29.8 pg (ref 26.0–34.0)
MCHC: 33.2 g/dL (ref 30.0–36.0)
MCV: 89.8 fL (ref 80.0–100.0)
Monocytes Absolute: 0.7 10*3/uL (ref 0.1–1.0)
Monocytes Relative: 10 %
Neutro Abs: 4.4 10*3/uL (ref 1.7–7.7)
Neutrophils Relative %: 59 %
Platelet Count: 126 10*3/uL — ABNORMAL LOW (ref 150–400)
RBC: 4.8 MIL/uL (ref 4.22–5.81)
RDW: 15 % (ref 11.5–15.5)
WBC Count: 7.4 10*3/uL (ref 4.0–10.5)
nRBC: 0 % (ref 0.0–0.2)

## 2022-03-16 MED ORDER — METHYLPREDNISOLONE 4 MG PO TBPK: ORAL_TABLET | ORAL | 0 refills | Status: DC

## 2022-03-20 ENCOUNTER — Other Ambulatory Visit: Payer: Self-pay

## 2022-03-24 ENCOUNTER — Other Ambulatory Visit (HOSPITAL_COMMUNITY): Payer: Self-pay

## 2022-03-25 ENCOUNTER — Other Ambulatory Visit (HOSPITAL_COMMUNITY): Payer: Self-pay

## 2022-03-30 ENCOUNTER — Ambulatory Visit (HOSPITAL_COMMUNITY)
Admission: RE | Admit: 2022-03-30 | Discharge: 2022-03-30 | Disposition: A | Discharge status: Home / Self Care | Payer: Medicare Other | Source: Ambulatory Visit | Attending: Physician Assistant | Admitting: Physician Assistant

## 2022-03-30 DIAGNOSIS — G319 Degenerative disease of nervous system, unspecified: Secondary | ICD-10-CM | POA: Diagnosis not present

## 2022-03-30 DIAGNOSIS — R42 Dizziness and giddiness: Secondary | ICD-10-CM | POA: Diagnosis not present

## 2022-03-30 DIAGNOSIS — C3492 Malignant neoplasm of unspecified part of left bronchus or lung: Secondary | ICD-10-CM | POA: Diagnosis not present

## 2022-03-30 MED ORDER — GADOBUTROL 1 MMOL/ML IV SOLN
7.5000 mL | Freq: Once | INTRAVENOUS | Status: AC | PRN
  Administered 2022-03-30: 7.5 mL via INTRAVENOUS

## 2022-04-02 ENCOUNTER — Telehealth: Payer: Self-pay

## 2022-04-02 NOTE — Telephone Encounter (Signed)
-----   Message from [employer], PA-C sent at 04/02/2022 10:13 AM EDT ----- Regarding: FW: Can you call him and let him know that there is no evidence of brain Mets ----- Message ----- From: Interface, Rad Results In Sent: 04/02/2022   9:33 AM EDT To: Tobe Sos Heilingoetter, PA-C

## 2022-04-02 NOTE — Telephone Encounter (Signed)
This nurse spoke with patients daughter and made her aware of the results from patients MR of Brain.  Advised that there is no evidence of brain mets.  The provider has no recommendations at this time.  She acknowledged understanding.  No questions or concerns noted and knows to call the clinic if the need should arise.

## 2022-04-07 ENCOUNTER — Other Ambulatory Visit: Payer: Self-pay

## 2022-04-13 ENCOUNTER — Telehealth: Payer: Self-pay | Admitting: Internal Medicine

## 2022-04-13 NOTE — Telephone Encounter (Signed)
Called per 11/11 in basket. Rescheduled appointments with patient's daughter. Patient notifed./

## 2022-04-14 ENCOUNTER — Other Ambulatory Visit: Payer: Self-pay

## 2022-04-16 ENCOUNTER — Other Ambulatory Visit (HOSPITAL_COMMUNITY): Payer: Self-pay

## 2022-04-16 ENCOUNTER — Other Ambulatory Visit: Payer: Medicare Other

## 2022-04-16 ENCOUNTER — Other Ambulatory Visit: Payer: Self-pay | Admitting: Physician Assistant

## 2022-04-16 ENCOUNTER — Ambulatory Visit: Payer: Medicare Other | Admitting: Physician Assistant

## 2022-04-16 DIAGNOSIS — C3492 Malignant neoplasm of unspecified part of left bronchus or lung: Secondary | ICD-10-CM

## 2022-04-16 MED ORDER — ERLOTINIB HCL 100 MG PO TABS
100.0000 mg | ORAL_TABLET | Freq: Every day | ORAL | 3 refills | Status: DC
  Filled 2022-04-16: qty 30, 30d supply, fill #0
  Filled 2022-05-28: qty 30, 30d supply, fill #1
  Filled 2022-06-22: qty 30, 30d supply, fill #2
  Filled 2022-07-24: qty 30, 30d supply, fill #3

## 2022-04-21 ENCOUNTER — Other Ambulatory Visit (HOSPITAL_COMMUNITY): Payer: Self-pay

## 2022-04-21 ENCOUNTER — Other Ambulatory Visit: Payer: Self-pay | Admitting: Medical Oncology

## 2022-04-21 DIAGNOSIS — Z5111 Encounter for antineoplastic chemotherapy: Secondary | ICD-10-CM

## 2022-04-22 ENCOUNTER — Other Ambulatory Visit: Payer: Self-pay

## 2022-04-22 ENCOUNTER — Inpatient Hospital Stay: Payer: Medicare Other | Attending: Physician Assistant

## 2022-04-22 ENCOUNTER — Inpatient Hospital Stay (HOSPITAL_BASED_OUTPATIENT_CLINIC_OR_DEPARTMENT_OTHER): Payer: Medicare Other | Admitting: Internal Medicine

## 2022-04-22 VITALS — BP 129/71 | HR 92 | Temp 97.5°F | Resp 17 | Wt 163.0 lb

## 2022-04-22 DIAGNOSIS — R9082 White matter disease, unspecified: Secondary | ICD-10-CM | POA: Diagnosis not present

## 2022-04-22 DIAGNOSIS — Z7901 Long term (current) use of anticoagulants: Secondary | ICD-10-CM | POA: Diagnosis not present

## 2022-04-22 DIAGNOSIS — C3412 Malignant neoplasm of upper lobe, left bronchus or lung: Secondary | ICD-10-CM | POA: Diagnosis not present

## 2022-04-22 DIAGNOSIS — R040 Epistaxis: Secondary | ICD-10-CM | POA: Diagnosis not present

## 2022-04-22 DIAGNOSIS — Z8673 Personal history of transient ischemic attack (TIA), and cerebral infarction without residual deficits: Secondary | ICD-10-CM | POA: Insufficient documentation

## 2022-04-22 DIAGNOSIS — C782 Secondary malignant neoplasm of pleura: Secondary | ICD-10-CM | POA: Diagnosis not present

## 2022-04-22 DIAGNOSIS — R590 Localized enlarged lymph nodes: Secondary | ICD-10-CM | POA: Insufficient documentation

## 2022-04-22 DIAGNOSIS — R5383 Other fatigue: Secondary | ICD-10-CM | POA: Diagnosis not present

## 2022-04-22 DIAGNOSIS — Z8546 Personal history of malignant neoplasm of prostate: Secondary | ICD-10-CM | POA: Insufficient documentation

## 2022-04-22 DIAGNOSIS — Z79899 Other long term (current) drug therapy: Secondary | ICD-10-CM | POA: Diagnosis not present

## 2022-04-22 DIAGNOSIS — G930 Cerebral cysts: Secondary | ICD-10-CM | POA: Insufficient documentation

## 2022-04-22 DIAGNOSIS — I4891 Unspecified atrial fibrillation: Secondary | ICD-10-CM | POA: Diagnosis not present

## 2022-04-22 DIAGNOSIS — R21 Rash and other nonspecific skin eruption: Secondary | ICD-10-CM | POA: Diagnosis not present

## 2022-04-22 DIAGNOSIS — Z923 Personal history of irradiation: Secondary | ICD-10-CM | POA: Insufficient documentation

## 2022-04-22 DIAGNOSIS — C3490 Malignant neoplasm of unspecified part of unspecified bronchus or lung: Secondary | ICD-10-CM

## 2022-04-22 DIAGNOSIS — Z5111 Encounter for antineoplastic chemotherapy: Secondary | ICD-10-CM

## 2022-04-22 DIAGNOSIS — Z800 Family history of malignant neoplasm of digestive organs: Secondary | ICD-10-CM | POA: Diagnosis not present

## 2022-04-22 LAB — CMP (CANCER CENTER ONLY)
ALT: 27 U/L (ref 0–44)
AST: 30 U/L (ref 15–41)
Albumin: 3.6 g/dL (ref 3.5–5.0)
Alkaline Phosphatase: 90 U/L (ref 38–126)
Anion gap: 8 (ref 5–15)
BUN: 15 mg/dL (ref 8–23)
CO2: 23 mmol/L (ref 22–32)
Calcium: 9.1 mg/dL (ref 8.9–10.3)
Chloride: 107 mmol/L (ref 98–111)
Creatinine: 1.05 mg/dL (ref 0.61–1.24)
GFR, Estimated: >60 mL/min (ref >60)
Glucose, Bld: 285 mg/dL — ABNORMAL HIGH (ref 70–99)
Potassium: 4.4 mmol/L (ref 3.5–5.1)
Sodium: 138 mmol/L (ref 135–145)
Total Bilirubin: 0.7 mg/dL (ref 0.3–1.2)
Total Protein: 7.3 g/dL (ref 6.5–8.1)

## 2022-04-22 LAB — CBC WITH DIFFERENTIAL (CANCER CENTER ONLY)
Abs Immature Granulocytes: 0.06 10*3/uL (ref 0.00–0.07)
Basophils Absolute: 0.1 10*3/uL (ref 0.0–0.1)
Basophils Relative: 1 %
Eosinophils Absolute: 0.4 10*3/uL (ref 0.0–0.5)
Eosinophils Relative: 6 %
HCT: 35.4 % — ABNORMAL LOW (ref 39.0–52.0)
Hemoglobin: 11.7 g/dL — ABNORMAL LOW (ref 13.0–17.0)
Immature Granulocytes: 1 %
Lymphocytes Relative: 21 %
Lymphs Abs: 1.3 10*3/uL (ref 0.7–4.0)
MCH: 29.6 pg (ref 26.0–34.0)
MCHC: 33.1 g/dL (ref 30.0–36.0)
MCV: 89.6 fL (ref 80.0–100.0)
Monocytes Absolute: 0.7 10*3/uL (ref 0.1–1.0)
Monocytes Relative: 11 %
Neutro Abs: 3.8 10*3/uL (ref 1.7–7.7)
Neutrophils Relative %: 60 %
Platelet Count: 129 10*3/uL — ABNORMAL LOW (ref 150–400)
RBC: 3.95 MIL/uL — ABNORMAL LOW (ref 4.22–5.81)
RDW: 14.6 % (ref 11.5–15.5)
WBC Count: 6.2 10*3/uL (ref 4.0–10.5)
nRBC: 0 % (ref 0.0–0.2)

## 2022-04-22 NOTE — Progress Notes (Signed)
[place] [phone]   [phone]  OFFICE PROGRESS NOTE  [name], MD [address] [place] [address]  DIAGNOSIS: Recurrent/metastatic non-small cell lung cancer, adenocarcinoma initially diagnosed as a stage Ib (T2 a, N0, M0) adenocarcinoma in September 2020 status post wedge resection of the left upper lobe nodule.  The patient has disease recurrence and September 2022 with metastatic mediastinal lymphadenopathy as well as left hilar and left pleural metastatic disease   PD-L1 expression was negative.   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  First dose April 30, 2021. Status post 2 cycles. Discontinued due to molecular studies positive for EGFR mutation   CURRENT THERAPY: Tarceva 150 mg p.o. daily started June 28, 2021.  Status post 2 month of treatment.  His dose was changed to Tarceva 100 mg p.o. daily on 09/04/2021 secondary to intolerance and frequent skin rash.  Status post 8 months of treatment.  INTERVAL HISTORY: Vincent Velez 81 y.o. male returns to the clinic today for follow-up visit accompanied by his grandson.  The patient is feeling fine today with no concerning complaints except for fatigue.  He also has some episodes of epistaxis recently.  He is currently on Xarelto for atrial fibrillation.  He denied having any current chest pain, shortness of breath, cough or hemoptysis.  He has no nausea, vomiting, diarrhea or constipation.  He has no headache or visual changes.  He is here today for evaluation and repeat blood work.  MEDICAL HISTORY: Past Medical History:   Diagnosis Date   AAA (abdominal aortic aneurysm) ([place])    Anxiety    Arthritis    Asthma    when younger   Atrial fibrillation (HCC)    CAD (coronary artery disease)    Cataract    removed both   COPD (chronic obstructive pulmonary disease) (H[facility])    Diabetes mellitus    Type II   Dysrhythmia    afib   Esophageal reflux    Family history of malignant neoplasm of gastrointestinal tract    Heart murmur    Hiatal hernia    Hyperlipemia    Hypertension    Lung nodule    a. PET scan highly suspicious for malignancy. Bronch with biopsy to be done after TAVR   Malignant neoplasm of prostate [facility])    prostate    Peripheral vascular disease ([place])    Pneumonia    Stricture and stenosis of esophagus     ALLERGIES:  is allergic to macrolides and ketolides.  MEDICATIONS:  Current Outpatient Medications  Medication Sig Dispense Refill   ACCU-CHEK GUIDE test strip USE TO CHECK YOUR BLOOD SUGAR ONCE DAILY     acetaminophen (TYLENOL) 500 MG tablet Take 2 tablets (1,000 mg total) by mouth every 6 (six) hours as needed for mild pain or fever. 30 tablet 0   diltiazem (CARDIZEM SR) 60 MG 12 hr capsule Take 1 capsule (60 mg total) by mouth every 12 (twelve) hours. 60 capsule 7   dorzolamide-timolol (COSOPT) 22.3-6.8 MG/ML ophthalmic solution Place 1 drop into both eyes in the morning, at noon, and at bedtime.  erlotinib (TARCEVA) 100 MG tablet Take 1 tablet (100 mg total) by mouth daily. Take on an empty stomach 1 hour before meals or 2 hours after. 30 tablet 3   glimepiride (AMARYL) 2 MG tablet Take 2 mg by mouth daily with breakfast.     JARDIANCE 25 MG TABS tablet Take 25 mg by mouth daily.     metFORMIN (GLUCOPHAGE-XR) 500 MG 24 hr tablet Take 1,000 mg by mouth every evening.      methylPREDNISolone (MEDROL DOSEPAK) 4 MG TBPK tablet Use as instructed 21 tablet 0   metoprolol succinate (TOPROL-XL) 25 MG 24 hr tablet Take 50 mg by mouth daily.     Multiple Vitamins-Minerals (MULTIVITAMIN  WITH MINERALS) tablet Take 1 tablet by mouth daily.     Multiple Vitamins-Minerals (ZINC PO) Take 1 tablet by mouth daily.     prochlorperazine (COMPAZINE) 10 MG tablet Take 1 tablet (10 mg total) by mouth every 6 (six) hours as needed for nausea or vomiting. 30 tablet 0   simvastatin (ZOCOR) 40 MG tablet Take 40 mg by mouth at bedtime.     XARELTO 20 MG TABS tablet TAKE 1 TABLET BY MOUTH  DAILY WITH SUPPER 90 tablet 1   No current facility-administered medications for this visit.    SURGICAL HISTORY:  Past Surgical History:  Procedure Laterality Date   ABDOMINAL AORTA STENT     CARDIAC CATHETERIZATION     CARDIOVERSION N/A 12/04/2015   Procedure: CARDIOVERSION;  Surgeon: Sanda Klein, MD;  Location: [facility];  Service: Cardiovascular;  Laterality: N/A;   COLONOSCOPY     FINGER SURGERY Right    middle finger- amputation   INSERTION PROSTATE RADIATION SEED     MEDIASTINOSCOPY N/A 01/06/2019   Procedure: MEDIASTINOSCOPY WITH BIOPIES;  Surgeon: Gaye Pollack, MD;  Location: [facility];  Service: Thoracic;  Laterality: N/A;   RIGHT/LEFT HEART CATH AND CORONARY ANGIOGRAPHY N/A 10/26/2018   Procedure: RIGHT/LEFT HEART CATH AND CORONARY ANGIOGRAPHY;  Surgeon: Burnell Blanks, MD;  Location: [facility];  Service: Cardiovascular;  Laterality: N/A;   TEE WITHOUT CARDIOVERSION N/A 12/06/2018   Procedure: TRANSESOPHAGEAL ECHOCARDIOGRAM (TEE);  Surgeon: Burnell Blanks, MD;  Location: [facility];  Service: Open Heart Surgery;  Laterality: N/A;   TRANSCATHETER AORTIC VALVE REPLACEMENT, TRANSFEMORAL N/A 12/06/2018   Procedure: TRANSCATHETER AORTIC VALVE REPLACEMENT, TRANSFEMORAL;  Surgeon: Burnell Blanks, MD;  Location: [facility];  Service: Open Heart Surgery;  Laterality: N/A;   UPPER GASTROINTESTINAL ENDOSCOPY     VIDEO ASSISTED THORACOSCOPY (VATS)/THOROCOTOMY Left 02/02/2019   Procedure: VIDEO ASSISTED THORACOSCOPY (VATS)/THOROCOTOMY;  Surgeon: Gaye Pollack, MD;  Location: [facility];  Service: Thoracic;  Laterality: Left;   VIDEO BRONCHOSCOPY N/A 01/06/2019   Procedure: VIDEO BRONCHOSCOPY;  Surgeon: Gaye Pollack, MD;  Location: [facility];  Service: Thoracic;  Laterality: N/A;   VIDEO BRONCHOSCOPY WITH ENDOBRONCHIAL ULTRASOUND N/A 03/07/2021   Procedure: VIDEO BRONCHOSCOPY WITH ENDOBRONCHIAL ULTRASOUND;  Surgeon: [name], MD;  Location: [facility];  Service: Thoracic;  Laterality: N/A;   WEDGE RESECTION Left 02/02/2019   Procedure: LUNG RESECTION;  Surgeon: Gaye Pollack, MD;  Location: [facility];  Service: Thoracic;  Laterality: Left;    REVIEW OF SYSTEMS:  A comprehensive review of systems was negative except for: Constitutional: positive for fatigue Ears, nose, mouth, throat, and face: positive for epistaxis   PHYSICAL EXAMINATION: General appearance: alert, cooperative, fatigued, and no distress Head: Normocephalic, without obvious abnormality, atraumatic Neck: no  adenopathy, no JVD, supple, symmetrical, trachea midline, and thyroid not enlarged, symmetric, no tenderness/mass/nodules Lymph nodes: Cervical, supraclavicular, and axillary nodes normal. Resp: clear to auscultation bilaterally Back: symmetric, no curvature. ROM normal. No CVA tenderness. Cardio: irregularly irregular rhythm GI: soft, non-tender; bowel sounds normal; no masses,  no organomegaly Extremities: extremities normal, atraumatic, no cyanosis or edema  ECOG PERFORMANCE STATUS: 1 - Symptomatic but completely ambulatory  Blood pressure 129/71, pulse 92, temperature (!) 97.5 F (36.4 C), temperature source Oral, resp. rate 17, weight 163 lb (73.9 kg), SpO2 95 %.  LABORATORY DATA: Lab Results  Component Value Date   WBC 6.2 04/22/2022   HGB 11.7 (L) 04/22/2022   HCT 35.4 (L) 04/22/2022   MCV 89.6 04/22/2022   PLT 129 (L) 04/22/2022      Chemistry      Component Value Date/Time   NA 134 (L) 03/16/2022 1510   NA 138 12/06/2019 1438   K 4.3 03/16/2022 1510    CL 102 03/16/2022 1510   CO2 24 03/16/2022 1510   BUN 21 03/16/2022 1510   BUN 14 12/06/2019 1438   CREATININE 0.95 03/16/2022 1510   CREATININE 0.81 11/29/2015 1335      Component Value Date/Time   CALCIUM 9.8 03/16/2022 1510   ALKPHOS 126 03/16/2022 1510   AST 33 03/16/2022 1510   ALT 23 03/16/2022 1510   BILITOT 0.9 03/16/2022 1510       RADIOGRAPHIC STUDIES: MR Brain W Wo Contrast  Result Date: 04/02/2022 CLINICAL DATA:  Lung cancer. Metastatic disease. Increased dizziness. EXAM: MRI HEAD WITHOUT AND WITH CONTRAST TECHNIQUE: Multiplanar, multiecho pulse sequences of the brain and surrounding structures were obtained without and with intravenous contrast. CONTRAST:  7.8m GADAVIST GADOBUTROL 1 MMOL/ML IV SOLN COMPARISON:  MR head 05/11/2021 FINDINGS: Brain: No acute infarct, hemorrhage, or mass lesion is present. 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 Moderate atrophy and diffuse white matter changes are stable. A remote cortical infarct in the posterior right parietal lobe on image 39 of series 9 is stable. The ventricles are proportionate to the degree of atrophy. No significant extraaxial fluid collection is present. Deep brain nuclei are otherwise within normal limits. The internal auditory canals are within normal limits. White matter changes extend into the brainstem, stable. 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 Postcontrast images demonstrate no pathologic enhancement to suggest metastatic disease of the brain or meninges. Vascular: Flow is present in the major intracranial arteries. Skull and upper cervical spine: The craniocervical junction is normal. Upper cervical spine is within normal limits. Marrow signal is unremarkable. Sinuses/Orbits: The paranasal sinuses and mastoid air cells are clear. Bilateral lens replacements are noted. Globes and orbits are otherwise unremarkable. IMPRESSION: 1. No evidence for metastatic disease to the brain or  meninges. 2. Stable atrophy and white matter disease likely reflects the sequela of chronic microvascular ischemia. 3. No acute intracranial abnormality or significant interval change. Electronically Signed   By: CSan MorelleM.D.   On: 04/02/2022 09:30    ASSESSMENT AND PLAN:  This is a very pleasant 81years old white male recently diagnosed with recurrent/metastatic non-small cell lung cancer, adenocarcinoma that was initially diagnosed as stage Ib (T2 a, N0, M0) adenocarcinoma in September 2020 status post wedge resection of the left lower lobe lung nodule and he has recurrence in September 2022 presented with metastatic mediastinal lymphadenopathy in addition to left hilar and left pleural metastatic disease. The patient had molecular studies by foundation 1  but there was insufficient material for the molecular test.  His PD-L1 expression was negative.  I will ask the pathology department to send his original resected tumor in 2020 for molecular studies as it may have enough tissue for testing. The patient started systemic chemotherapy with carboplatin for AUC of 5, Alimta 500 Mg/M2 and Keytruda 200 Mg IV every 3 weeks on April 30, 2021.  Status post 2 cycles.  He had evidence for disease progression after the first 2 cycles of his treatment. He had repeat CT scan of the chest that showed progressive metastatic disease throughout the pleura of the left hemothorax with new right hilar lymphadenopathy and increasing prevascular lymphadenopathy.  His treatment with chemotherapy was discontinued. Molecular studies from his resected tumor in September 2020 showed positive EGFR mutation, exon 51. The patient started treatment with Tarceva 150 mg p.o. daily on June 28, 2021.  He was not a good candidate for treatment with Tagrisso because of prolonged QT interval and concern about arrhythmia with Tagrisso. He has been on treatment with Tarceva 100 mg p.o. daily status post 7 months. The patient  has been tolerating this treatment well with no concerning adverse effect except for fatigue secondary to anemia probably from blood loss from epistaxis.  The patient is currently on treatment with Xarelto for atrial fibrillation and this probably the etiology for his epistaxis. I recommended for him to continue his current treatment with Tarceva with the same dose. He has an appointment with his dentist for some dental extraction and I do not see any contraindication from his treatment with Tarceva but he may need to hold his Xarelto for 1-2 days before the procedure to avoid any excessive bleeding. The patient will come back for follow-up visit in 1 months for evaluation and repeat blood work. He was advised to call immediately if he has any concerning symptoms in the interval. The patient voices understanding of current disease status and treatment options and is in agreement with the current care plan.  All questions were answered. The patient knows to call the clinic with any problems, questions or concerns. We can certainly see the patient much sooner if necessary.  Disclaimer: This note was dictated with voice recognition software. Similar sounding words can inadvertently be transcribed and may not be corrected upon review.

## 2022-04-23 ENCOUNTER — Other Ambulatory Visit: Payer: Self-pay

## 2022-04-24 ENCOUNTER — Telehealth: Payer: Self-pay | Admitting: Internal Medicine

## 2022-04-24 NOTE — Telephone Encounter (Signed)
Contacted patient to scheduled appointments. Patient is aware of appointments that are scheduled.   

## 2022-04-25 ENCOUNTER — Other Ambulatory Visit: Payer: Self-pay

## 2022-04-30 ENCOUNTER — Other Ambulatory Visit: Payer: Self-pay

## 2022-05-13 ENCOUNTER — Other Ambulatory Visit (HOSPITAL_COMMUNITY): Payer: Self-pay

## 2022-05-19 ENCOUNTER — Other Ambulatory Visit: Payer: Self-pay | Admitting: Pharmacist

## 2022-05-28 ENCOUNTER — Other Ambulatory Visit (HOSPITAL_COMMUNITY): Payer: Self-pay

## 2022-06-03 ENCOUNTER — Other Ambulatory Visit: Payer: Self-pay

## 2022-06-03 ENCOUNTER — Other Ambulatory Visit (HOSPITAL_COMMUNITY): Payer: Self-pay

## 2022-06-09 ENCOUNTER — Telehealth: Payer: Self-pay | Admitting: Pharmacy Technician

## 2022-06-09 ENCOUNTER — Other Ambulatory Visit (HOSPITAL_COMMUNITY): Payer: Self-pay

## 2022-06-09 NOTE — Telephone Encounter (Signed)
Oral Oncology Patient Advocate Encounter   Was successful in securing patient a $5,000 grant from [employer] (PA[employer]) to provide copayment coverage for Tarceva.  This will keep the out of pocket expense at $0.     I have spoken with the patient.    The billing information is as follows and has been shared with [employer]   RxBin: [id number] PCN:  [id number] Member ID: [id number] Group ID: [id number] Dates of Eligibility: 12/11/21 through 06/09/23  Lady Deutscher, CPhT-Adv Oncology Pharmacy Patient [place] Direct Number: [phone]  Fax: [phone]

## 2022-06-12 ENCOUNTER — Telehealth: Payer: Self-pay | Admitting: Pharmacy Technician

## 2022-06-12 ENCOUNTER — Other Ambulatory Visit (HOSPITAL_COMMUNITY): Payer: Self-pay

## 2022-06-12 NOTE — Telephone Encounter (Signed)
Oral Oncology Patient Advocate Encounter   Was successful in securing patient an $4,100 grant from [employer] [facility]) to provide copayment coverage for Tarceva.  This will keep the out of pocket expense at $0.     I have spoken with the patient.    The billing information is as follows and has been shared with [facility].   Member ID: [id number] Group ID: [id number] RxBin: [id number] Dates of Eligibility: 06/12/22 through 06/12/23  Fund:  NSCLC  Jinger Neighbors, CPhT-Adv Oncology Pharmacy Patient Advocate [facility] Direct Number: [phone]  Fax: [phone]

## 2022-06-15 ENCOUNTER — Other Ambulatory Visit: Payer: Self-pay | Admitting: Medical Oncology

## 2022-06-15 DIAGNOSIS — C3490 Malignant neoplasm of unspecified part of unspecified bronchus or lung: Secondary | ICD-10-CM

## 2022-06-16 ENCOUNTER — Inpatient Hospital Stay (HOSPITAL_BASED_OUTPATIENT_CLINIC_OR_DEPARTMENT_OTHER): Payer: Medicare Other | Admitting: Internal Medicine

## 2022-06-16 ENCOUNTER — Inpatient Hospital Stay: Payer: Medicare Other | Attending: Physician Assistant

## 2022-06-16 ENCOUNTER — Other Ambulatory Visit: Payer: Self-pay

## 2022-06-16 VITALS — BP 126/70 | HR 65 | Temp 97.6°F | Resp 20 | Wt 160.1 lb

## 2022-06-16 DIAGNOSIS — J4489 Other specified chronic obstructive pulmonary disease: Secondary | ICD-10-CM | POA: Insufficient documentation

## 2022-06-16 DIAGNOSIS — R197 Diarrhea, unspecified: Secondary | ICD-10-CM | POA: Diagnosis not present

## 2022-06-16 DIAGNOSIS — C771 Secondary and unspecified malignant neoplasm of intrathoracic lymph nodes: Secondary | ICD-10-CM | POA: Diagnosis not present

## 2022-06-16 DIAGNOSIS — C782 Secondary malignant neoplasm of pleura: Secondary | ICD-10-CM | POA: Insufficient documentation

## 2022-06-16 DIAGNOSIS — Z79899 Other long term (current) drug therapy: Secondary | ICD-10-CM | POA: Diagnosis not present

## 2022-06-16 DIAGNOSIS — I4891 Unspecified atrial fibrillation: Secondary | ICD-10-CM | POA: Diagnosis not present

## 2022-06-16 DIAGNOSIS — Z7901 Long term (current) use of anticoagulants: Secondary | ICD-10-CM | POA: Diagnosis not present

## 2022-06-16 DIAGNOSIS — Z800 Family history of malignant neoplasm of digestive organs: Secondary | ICD-10-CM | POA: Diagnosis not present

## 2022-06-16 DIAGNOSIS — Z8546 Personal history of malignant neoplasm of prostate: Secondary | ICD-10-CM | POA: Insufficient documentation

## 2022-06-16 DIAGNOSIS — C3490 Malignant neoplasm of unspecified part of unspecified bronchus or lung: Secondary | ICD-10-CM | POA: Diagnosis not present

## 2022-06-16 DIAGNOSIS — R21 Rash and other nonspecific skin eruption: Secondary | ICD-10-CM | POA: Insufficient documentation

## 2022-06-16 DIAGNOSIS — E119 Type 2 diabetes mellitus without complications: Secondary | ICD-10-CM | POA: Insufficient documentation

## 2022-06-16 DIAGNOSIS — C3412 Malignant neoplasm of upper lobe, left bronchus or lung: Secondary | ICD-10-CM | POA: Diagnosis present

## 2022-06-16 DIAGNOSIS — Z923 Personal history of irradiation: Secondary | ICD-10-CM | POA: Diagnosis not present

## 2022-06-16 LAB — CMP (CANCER CENTER ONLY)
ALT: 19 U/L (ref 0–44)
AST: 31 U/L (ref 15–41)
Albumin: 3.9 g/dL (ref 3.5–5.0)
Alkaline Phosphatase: 102 U/L (ref 38–126)
Anion gap: 8 (ref 5–15)
BUN: 26 mg/dL — ABNORMAL HIGH (ref 8–23)
CO2: 23 mmol/L (ref 22–32)
Calcium: 9.7 mg/dL (ref 8.9–10.3)
Chloride: 104 mmol/L (ref 98–111)
Creatinine: 0.97 mg/dL (ref 0.61–1.24)
GFR, Estimated: >60 mL/min (ref >60)
Glucose, Bld: 151 mg/dL — ABNORMAL HIGH (ref 70–99)
Potassium: 4.4 mmol/L (ref 3.5–5.1)
Sodium: 135 mmol/L (ref 135–145)
Total Bilirubin: 0.5 mg/dL (ref 0.3–1.2)
Total Protein: 7.7 g/dL (ref 6.5–8.1)

## 2022-06-16 LAB — CBC WITH DIFFERENTIAL (CANCER CENTER ONLY)
Abs Immature Granulocytes: 0.06 10*3/uL (ref 0.00–0.07)
Basophils Absolute: 0.1 10*3/uL (ref 0.0–0.1)
Basophils Relative: 1 %
Eosinophils Absolute: 0.5 10*3/uL (ref 0.0–0.5)
Eosinophils Relative: 5 %
HCT: 41.3 % (ref 39.0–52.0)
Hemoglobin: 13.5 g/dL (ref 13.0–17.0)
Immature Granulocytes: 1 %
Lymphocytes Relative: 20 %
Lymphs Abs: 1.7 10*3/uL (ref 0.7–4.0)
MCH: 28.9 pg (ref 26.0–34.0)
MCHC: 32.7 g/dL (ref 30.0–36.0)
MCV: 88.4 fL (ref 80.0–100.0)
Monocytes Absolute: 0.8 10*3/uL (ref 0.1–1.0)
Monocytes Relative: 10 %
Neutro Abs: 5.4 10*3/uL (ref 1.7–7.7)
Neutrophils Relative %: 63 %
Platelet Count: 143 10*3/uL — ABNORMAL LOW (ref 150–400)
RBC: 4.67 MIL/uL (ref 4.22–5.81)
RDW: 15.2 % (ref 11.5–15.5)
WBC Count: 8.5 10*3/uL (ref 4.0–10.5)
nRBC: 0 % (ref 0.0–0.2)

## 2022-06-16 NOTE — Progress Notes (Signed)
[facility] [phone]   [phone]  OFFICE PROGRESS NOTE  [name], MD [address] [place] [address]  DIAGNOSIS: Recurrent/metastatic non-small cell lung cancer, adenocarcinoma initially diagnosed as a stage Ib (T2 a, N0, M0) adenocarcinoma in September 2020 status post wedge resection of the left upper lobe nodule.  The patient has disease recurrence and September 2022 with metastatic mediastinal lymphadenopathy as well as left hilar and left pleural metastatic disease   PD-L1 expression was negative.   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  First dose April 30, 2021. Status post 2 cycles. Discontinued due to molecular studies positive for EGFR mutation   CURRENT THERAPY: Tarceva 150 mg p.o. daily started June 28, 2021.  Status post 2 month of treatment.  His dose was changed to Tarceva 100 mg p.o. daily on 09/04/2021 secondary to intolerance and frequent skin rash.  Status post 9 months of treatment.  INTERVAL HISTORY: Vincent Velez 82 y.o. male returns to the clinic today for follow-up visit accompanied by his daughter.  The patient is feeling fine today with no concerning complaints except for mild fatigue and occasional diarrhea.  He denied having any chest pain, shortness of breath, cough or hemoptysis.  He has no nausea, vomiting, abdominal pain or constipation.  He has no headache or visual changes.  He has been tolerating his treatment with Tarceva fairly well.  He is here today for evaluation and repeat blood work.   MEDICAL HISTORY: Past Medical History:  Diagnosis  Date   AAA (abdominal aortic aneurysm) (HCC)    Anxiety    Arthritis    Asthma    when younger   Atrial fibrillation (HCC)    CAD (coronary artery disease)    Cataract    removed both   COPD (chronic obstructive pulmonary disease) (HCC)    Diabetes mellitus    Type II   Dysrhythmia    afib   Esophageal reflux    Family history of malignant neoplasm of gastrointestinal tract    Heart murmur    Hiatal hernia    Hyperlipemia    Hypertension    Lung nodule    a. PET scan highly suspicious for malignancy. Bronch with biopsy to be done after TAVR   Malignant neoplasm of prostate [facility])    prostate    Peripheral vascular disease (HCC)    Pneumonia    Stricture and stenosis of esophagus     ALLERGIES:  is allergic to macrolides and ketolides.  MEDICATIONS:  Current Outpatient Medications  Medication Sig Dispense Refill   ACCU-CHEK GUIDE test strip USE TO CHECK YOUR BLOOD SUGAR ONCE DAILY     acetaminophen (TYLENOL) 500 MG tablet Take 2 tablets (1,000 mg total) by mouth every 6 (six) hours as needed for mild pain or fever. 30 tablet 0   diltiazem (CARDIZEM SR) 60 MG 12 hr capsule Take 1 capsule (60 mg total) by mouth every 12 (twelve) hours. 60 capsule 7   dorzolamide-timolol (COSOPT) 22.3-6.8 MG/ML ophthalmic solution Place 1 drop into both eyes in the morning, at noon, and at bedtime.  erlotinib (TARCEVA) 100 MG tablet Take 1 tablet (100 mg total) by mouth daily. Take on an empty stomach 1 hour before meals or 2 hours after. 30 tablet 3   glimepiride (AMARYL) 2 MG tablet Take 2 mg by mouth daily with breakfast.     JARDIANCE 25 MG TABS tablet Take 25 mg by mouth daily.     metFORMIN (GLUCOPHAGE-XR) 500 MG 24 hr tablet Take 1,000 mg by mouth every evening.      methylPREDNISolone (MEDROL DOSEPAK) 4 MG TBPK tablet Use as instructed 21 tablet 0   metoprolol succinate (TOPROL-XL) 25 MG 24 hr tablet Take 50 mg by mouth daily.     Multiple Vitamins-Minerals (MULTIVITAMIN WITH  MINERALS) tablet Take 1 tablet by mouth daily.     Multiple Vitamins-Minerals (ZINC PO) Take 1 tablet by mouth daily.     prochlorperazine (COMPAZINE) 10 MG tablet Take 1 tablet (10 mg total) by mouth every 6 (six) hours as needed for nausea or vomiting. 30 tablet 0   simvastatin (ZOCOR) 40 MG tablet Take 40 mg by mouth at bedtime.     XARELTO 20 MG TABS tablet TAKE 1 TABLET BY MOUTH  DAILY WITH SUPPER 90 tablet 1   No current facility-administered medications for this visit.    SURGICAL HISTORY:  Past Surgical History:  Procedure Laterality Date   ABDOMINAL AORTA STENT     CARDIAC CATHETERIZATION     CARDIOVERSION N/A 12/04/2015   Procedure: CARDIOVERSION;  Surgeon: Thurmon Fair, MD;  Location: [facility];  Service: Cardiovascular;  Laterality: N/A;   COLONOSCOPY     FINGER SURGERY Right    middle finger- amputation   INSERTION PROSTATE RADIATION SEED     MEDIASTINOSCOPY N/A 01/06/2019   Procedure: MEDIASTINOSCOPY WITH BIOPIES;  Surgeon: Alleen Borne, MD;  Location: [facility] OR;  Service: Thoracic;  Laterality: N/A;   RIGHT/LEFT HEART CATH AND CORONARY ANGIOGRAPHY N/A 10/26/2018   Procedure: RIGHT/LEFT HEART CATH AND CORONARY ANGIOGRAPHY;  Surgeon: Kathleene Hazel, MD;  Location: [facility] INVASIVE CV LAB;  Service: Cardiovascular;  Laterality: N/A;   TEE WITHOUT CARDIOVERSION N/A 12/06/2018   Procedure: TRANSESOPHAGEAL ECHOCARDIOGRAM (TEE);  Surgeon: Kathleene Hazel, MD;  Location: [facility] INVASIVE CV LAB;  Service: Open Heart Surgery;  Laterality: N/A;   TRANSCATHETER AORTIC VALVE REPLACEMENT, TRANSFEMORAL N/A 12/06/2018   Procedure: TRANSCATHETER AORTIC VALVE REPLACEMENT, TRANSFEMORAL;  Surgeon: Kathleene Hazel, MD;  Location: [facility] INVASIVE CV LAB;  Service: Open Heart Surgery;  Laterality: N/A;   UPPER GASTROINTESTINAL ENDOSCOPY     VIDEO ASSISTED THORACOSCOPY (VATS)/THOROCOTOMY Left 02/02/2019   Procedure: VIDEO ASSISTED THORACOSCOPY (VATS)/THOROCOTOMY;  Surgeon: Alleen Borne,  MD;  Location: [facility] OR;  Service: Thoracic;  Laterality: Left;   VIDEO BRONCHOSCOPY N/A 01/06/2019   Procedure: VIDEO BRONCHOSCOPY;  Surgeon: Alleen Borne, MD;  Location: [facility] OR;  Service: Thoracic;  Laterality: N/A;   VIDEO BRONCHOSCOPY WITH ENDOBRONCHIAL ULTRASOUND N/A 03/07/2021   Procedure: VIDEO BRONCHOSCOPY WITH ENDOBRONCHIAL ULTRASOUND;  Surgeon: Loreli Slot, MD;  Location: [facility] OR;  Service: Thoracic;  Laterality: N/A;   WEDGE RESECTION Left 02/02/2019   Procedure: LUNG RESECTION;  Surgeon: Alleen Borne, MD;  Location: [facility] OR;  Service: Thoracic;  Laterality: Left;    REVIEW OF SYSTEMS:  A comprehensive review of systems was negative except for: Constitutional: positive for fatigue Gastrointestinal: positive for diarrhea   PHYSICAL EXAMINATION: General appearance: alert, cooperative, fatigued, and no distress Head: Normocephalic, without obvious abnormality, atraumatic Neck: no adenopathy, no JVD, supple, symmetrical,  trachea midline, and thyroid not enlarged, symmetric, no tenderness/mass/nodules Lymph nodes: Cervical, supraclavicular, and axillary nodes normal. Resp: clear to auscultation bilaterally Back: symmetric, no curvature. ROM normal. No CVA tenderness. Cardio: irregularly irregular rhythm GI: soft, non-tender; bowel sounds normal; no masses,  no organomegaly Extremities: extremities normal, atraumatic, no cyanosis or edema  ECOG PERFORMANCE STATUS: 1 - Symptomatic but completely ambulatory  Blood pressure 126/70, pulse 65, temperature 97.6 F (36.4 C), resp. rate 20, weight 160 lb 1.6 oz (72.6 kg), SpO2 94 %.  LABORATORY DATA: Lab Results  Component Value Date   WBC 8.5 06/16/2022   HGB 13.5 06/16/2022   HCT 41.3 06/16/2022   MCV 88.4 06/16/2022   PLT 143 (L) 06/16/2022      Chemistry      Component Value Date/Time   NA 135 06/16/2022 1502   NA 138 12/06/2019 1438   K 4.4 06/16/2022 1502   CL 104 06/16/2022 1502   CO2 23 06/16/2022 1502   BUN 26  (H) 06/16/2022 1502   BUN 14 12/06/2019 1438   CREATININE 0.97 06/16/2022 1502   CREATININE 0.81 11/29/2015 1335      Component Value Date/Time   CALCIUM 9.7 06/16/2022 1502   ALKPHOS 102 06/16/2022 1502   AST 31 06/16/2022 1502   ALT 19 06/16/2022 1502   BILITOT 0.5 06/16/2022 1502       RADIOGRAPHIC STUDIES: No results found.  ASSESSMENT AND PLAN:  This is a very pleasant 82 years old white male recently diagnosed with recurrent/metastatic non-small cell lung cancer, adenocarcinoma that was initially diagnosed as stage Ib (T2 a, N0, M0) adenocarcinoma in September 2020 status post wedge resection of the left lower lobe lung nodule and he has recurrence in September 2022 presented with metastatic mediastinal lymphadenopathy in addition to left hilar and left pleural metastatic disease. The patient had molecular studies by foundation 1 but there was insufficient material for the molecular test.  His PD-L1 expression was negative.  I will ask the pathology department to send his original resected tumor in 2020 for molecular studies as it may have enough tissue for testing. The patient started systemic chemotherapy with carboplatin for AUC of 5, Alimta 500 Mg/M2 and Keytruda 200 Mg IV every 3 weeks on April 30, 2021.  Status post 2 cycles.  He had evidence for disease progression after the first 2 cycles of his treatment. He had repeat CT scan of the chest that showed progressive metastatic disease throughout the pleura of the left hemothorax with new right hilar lymphadenopathy and increasing prevascular lymphadenopathy.  His treatment with chemotherapy was discontinued. 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 The patient started treatment with Tarceva 150 mg p.o. daily on June 28, 2021.  He was not a good candidate for treatment with Tagrisso because of prolonged QT interval and concern about arrhythmia with Tagrisso. He has  been on treatment with Tarceva 100 mg p.o. daily status post 9 months. The patient has been tolerating this treatment well with no concerning adverse effect except for occasional diarrhea. I recommended for him to continue his current treatment with Tarceva with the same dose. I will see him back for follow-up visit in 1 months for evaluation with repeat CT scan of the chest, abdomen and pelvis for restaging of his disease. The patient was advised to call immediately if he has any other concerning symptoms in the interval.  The patient voices understanding of current disease status and treatment  options and is in agreement with the current care plan.  All questions were answered. The patient knows to call the clinic with any problems, questions or concerns. We can certainly see the patient much sooner if necessary.  Disclaimer: This note was dictated with voice recognition software. Similar sounding words can inadvertently be transcribed and may not be corrected upon review.

## 2022-06-22 ENCOUNTER — Telehealth: Payer: Self-pay | Admitting: Physician Assistant

## 2022-06-22 ENCOUNTER — Other Ambulatory Visit (HOSPITAL_COMMUNITY): Payer: Self-pay

## 2022-06-22 NOTE — Telephone Encounter (Signed)
Patient's daughter called to change time of lab to be right before scan. Adjusted time of lab. Patient will be notified.

## 2022-06-24 ENCOUNTER — Other Ambulatory Visit: Payer: Self-pay

## 2022-06-24 ENCOUNTER — Other Ambulatory Visit (HOSPITAL_COMMUNITY): Payer: Self-pay

## 2022-06-26 ENCOUNTER — Other Ambulatory Visit: Payer: Self-pay

## 2022-07-12 NOTE — Progress Notes (Signed)
[facility] OFFICE PROGRESS NOTE  Vincent Dills, MD [address] [place] [address]  DIAGNOSIS: Recurrent/metastatic non-small cell lung cancer, adenocarcinoma initially diagnosed as a stage Ib (T2 a, N0, M0) adenocarcinoma in September 2020 status post wedge resection of the left upper lobe nodule.  The patient has disease recurrence and September 2022 with metastatic mediastinal lymphadenopathy as well as left hilar and left pleural metastatic disease   PD-L1 expression was negative.   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  First dose April 30, 2021. Status post 2 cycles. Discontinued due to molecular studies positive for EGFR mutation  CURRENT THERAPY: Tarceva 150 mg p.o. daily started June 28, 2021.  Status post 2 month of treatment.  His dose was changed to Tarceva 100 mg p.o. daily on 09/04/2021 secondary to intolerance and frequent skin rash.  Status post 10 months of treatment.   INTERVAL HISTORY: Vincent Velez 82 y.o. male returns to the clinic today for a follow-up visit accompanied by his daughter.  The patient is feeling fairly well today without any concerning complaints except for he thinks he may feel a little bit weaker. Overall, the patient is very active at home. He often is in the yard cutting wood.  The patient was last seen by Dr. Arbutus Ped on 06/16/2022. The patient is currently on targeted treatment with dose reduced Tarceva secondary to intolerance with skin rash and diarrhea.  The patient has been tolerating the reduced dose better with mild fatigue, manageable skin rash, and occasional diarrhea.   He uses hydrocortisone cream for the rash. The patient denies any fever, chills, night sweats, or unexplained weight loss.  He does mention he coughs up clear phlegm and has some dyspnea with certain activities. He thinks this may have gotten a little worse.  He denies any chest pain or hemoptysis.  Denies any nausea, vomiting, or constipation. As previously mentioned, he does get some diarrhea due to Tarceva but he is taking an iron supplement which causes constipation, therefore, his bowel habits have balanced out recently. Had diarrhea periodically but gotten better taking iron.  Denies any headache or visual changes.  The patient recently had a restaging CT scan performed.  The patient is here today for evaluation and to review his scan results   MEDICAL HISTORY: Past Medical History:  Diagnosis Date   AAA (abdominal aortic aneurysm) (HCC)    Anxiety    Arthritis    Asthma    when younger   Atrial fibrillation (HCC)    CAD (coronary artery disease)    Cataract    removed both   COPD (chronic obstructive pulmonary disease) (HCC)    Diabetes mellitus    Type II   Dysrhythmia    afib   Esophageal reflux    Family history of malignant neoplasm of gastrointestinal tract    Heart murmur    Hiatal hernia    Hyperlipemia    Hypertension    Lung nodule    a. PET scan highly suspicious for malignancy. Bronch with biopsy to be done after TAVR   Malignant neoplasm of prostate [facility])    prostate  Peripheral vascular disease (HCC)    Pneumonia    Stricture and stenosis of esophagus     ALLERGIES:  is allergic to macrolides and ketolides.  MEDICATIONS:  Current Outpatient Medications  Medication Sig Dispense Refill   methylPREDNISolone (MEDROL DOSEPAK) 4 MG TBPK tablet Use as instructed 21 tablet 0   ACCU-CHEK GUIDE test strip USE TO CHECK YOUR BLOOD SUGAR ONCE DAILY     acetaminophen (TYLENOL) 500 MG tablet Take 2 tablets (1,000 mg total) by mouth every 6 (six) hours as needed for  mild pain or fever. 30 tablet 0   diltiazem (CARDIZEM SR) 60 MG 12 hr capsule Take 1 capsule (60 mg total) by mouth every 12 (twelve) hours. 60 capsule 7   dorzolamide-timolol (COSOPT) 22.3-6.8 MG/ML ophthalmic solution Place 1 drop into both eyes in the morning, at noon, and at bedtime.     erlotinib (TARCEVA) 100 MG tablet Take 1 tablet (100 mg total) by mouth daily. Take on an empty stomach 1 hour before meals or 2 hours after. 30 tablet 3   glimepiride (AMARYL) 2 MG tablet Take 2 mg by mouth daily with breakfast.     JARDIANCE 25 MG TABS tablet Take 25 mg by mouth daily.     metFORMIN (GLUCOPHAGE-XR) 500 MG 24 hr tablet Take 1,000 mg by mouth every evening.      metoprolol succinate (TOPROL-XL) 25 MG 24 hr tablet Take 50 mg by mouth daily.     Multiple Vitamins-Minerals (MULTIVITAMIN WITH MINERALS) tablet Take 1 tablet by mouth daily.     Multiple Vitamins-Minerals (ZINC PO) Take 1 tablet by mouth daily.     prochlorperazine (COMPAZINE) 10 MG tablet Take 1 tablet (10 mg total) by mouth every 6 (six) hours as needed for nausea or vomiting. 30 tablet 0   simvastatin (ZOCOR) 40 MG tablet Take 40 mg by mouth at bedtime.     XARELTO 20 MG TABS tablet TAKE 1 TABLET BY MOUTH  DAILY WITH SUPPER 90 tablet 1   No current facility-administered medications for this visit.    SURGICAL HISTORY:  Past Surgical History:  Procedure Laterality Date   ABDOMINAL AORTA STENT     CARDIAC CATHETERIZATION     CARDIOVERSION N/A 12/04/2015   Procedure: CARDIOVERSION;  Surgeon: Thurmon Fair, MD;  Location: [facility] ENDOSCOPY;  Service: Cardiovascular;  Laterality: N/A;   COLONOSCOPY     FINGER SURGERY Right    middle finger- amputation   INSERTION PROSTATE RADIATION SEED     MEDIASTINOSCOPY N/A 01/06/2019   Procedure: MEDIASTINOSCOPY WITH BIOPIES;  Surgeon: Alleen Borne, MD;  Location: [facility] OR;  Service: Thoracic;  Laterality: N/A;   RIGHT/LEFT HEART CATH AND CORONARY ANGIOGRAPHY N/A 10/26/2018   Procedure:  RIGHT/LEFT HEART CATH AND CORONARY ANGIOGRAPHY;  Surgeon: Kathleene Hazel, MD;  Location: [facility] INVASIVE CV LAB;  Service: Cardiovascular;  Laterality: N/A;   TEE WITHOUT CARDIOVERSION N/A 12/06/2018   Procedure: TRANSESOPHAGEAL ECHOCARDIOGRAM (TEE);  Surgeon: Kathleene Hazel, MD;  Location: [facility] INVASIVE CV LAB;  Service: Open Heart Surgery;  Laterality: N/A;   TRANSCATHETER AORTIC VALVE REPLACEMENT, TRANSFEMORAL N/A 12/06/2018   Procedure: TRANSCATHETER AORTIC VALVE REPLACEMENT, TRANSFEMORAL;  Surgeon: Kathleene Hazel, MD;  Location: [facility] INVASIVE CV LAB;  Service: Open Heart Surgery;  Laterality: N/A;   UPPER GASTROINTESTINAL ENDOSCOPY     VIDEO ASSISTED THORACOSCOPY (VATS)/THOROCOTOMY Left 02/02/2019   Procedure: VIDEO ASSISTED THORACOSCOPY (VATS)/THOROCOTOMY;  Surgeon: Alleen Borne, MD;  Location: [facility] OR;  Service: Thoracic;  Laterality: Left;  VIDEO BRONCHOSCOPY N/A 01/06/2019   Procedure: VIDEO BRONCHOSCOPY;  Surgeon: Alleen Borne, MD;  Location: [facility] OR;  Service: Thoracic;  Laterality: N/A;   VIDEO BRONCHOSCOPY WITH ENDOBRONCHIAL ULTRASOUND N/A 03/07/2021   Procedure: VIDEO BRONCHOSCOPY WITH ENDOBRONCHIAL ULTRASOUND;  Surgeon: Loreli Slot, MD;  Location: [facility] OR;  Service: Thoracic;  Laterality: N/A;   WEDGE RESECTION Left 02/02/2019   Procedure: LUNG RESECTION;  Surgeon: Alleen Borne, MD;  Location: [facility] OR;  Service: Thoracic;  Laterality: Left;    REVIEW OF SYSTEMS:   Review of Systems  Constitutional: Negative for appetite change, chills, fatigue, fever and unexpected weight change.  HENT:   Negative for mouth sores, nosebleeds, sore throat and trouble swallowing.   Eyes: Negative for eye problems and icterus.  Respiratory: Positive for mild increase in his cough and shortness of breath with exertion. Negative for hemoptysis and wheezing.   Cardiovascular: Negative for chest pain and leg swelling.  Gastrointestinal: Positive for mild occasional diarrhea.  Negative for abdominal pain, constipation, nausea and vomiting.  Genitourinary: Negative for bladder incontinence, difficulty urinating, dysuria, frequency and hematuria.   Musculoskeletal: Negative for back pain, gait problem, neck pain and neck stiffness.  Skin: Positive for dry scattered skin rash on upper extremities and face.  Neurological: Negative for dizziness, extremity weakness, gait problem, headaches, light-headedness and seizures.  Hematological: Negative for adenopathy. Does not bruise/bleed easily.  Psychiatric/Behavioral: Negative for confusion, depression and sleep disturbance. The patient is not nervous/anxious.     PHYSICAL EXAMINATION:  Blood pressure 117/67, pulse 66, temperature 97.6 F (36.4 C), temperature source Oral, resp. rate 16, weight 160 lb 6.4 oz (72.8 kg), SpO2 95 %.  ECOG PERFORMANCE STATUS: 1  Physical Exam  Constitutional: Oriented to person, place, and time and well-developed, well-nourished, and in no distress.  HENT:  Head: Normocephalic and atraumatic.  Mouth/Throat: Oropharynx is clear and moist. No oropharyngeal exudate.  Eyes: Conjunctivae are normal. Right eye exhibits no discharge. Left eye exhibits no discharge. No scleral icterus.  Neck: Normal range of motion. Neck supple.  Cardiovascular: Normal rate, regular rhythm, normal heart sounds and intact distal pulses.   Pulmonary/Chest: Effort normal and breath sounds normal. No respiratory distress. No wheezes. No rales.  Abdominal: Soft. Bowel sounds are normal. Exhibits no distension and no mass. There is no tenderness.  Musculoskeletal: Normal range of motion. Exhibits no edema.  Lymphadenopathy:    No cervical adenopathy.  Neurological: Alert and oriented to person, place, and time. Exhibits normal muscle tone. Gait normal. Coordination normal.  Skin: Positive for dry scattered skin rash on upper extremities and face/neck. Skin is warm and dry. Not diaphoretic. No erythema. No pallor.   Psychiatric: Mood, memory and judgment normal.  Vitals reviewed.  LABORATORY DATA: Lab Results  Component Value Date   WBC 8.5 07/13/2022   HGB 13.9 07/13/2022   HCT 41.9 07/13/2022   MCV 87.1 07/13/2022   PLT 145 (L) 07/13/2022      Chemistry      Component Value Date/Time   NA 136 07/13/2022 1235   NA 138 12/06/2019 1438   K 4.2 07/13/2022 1235   CL 103 07/13/2022 1235   CO2 25 07/13/2022 1235   BUN 19 07/13/2022 1235   BUN 14 12/06/2019 1438   CREATININE 0.91 07/13/2022 1235   CREATININE 0.81 11/29/2015 1335      Component Value Date/Time   CALCIUM 10.0 07/13/2022 1235   ALKPHOS 103 07/13/2022 1235   AST 27 07/13/2022 1235   ALT  16 07/13/2022 1235   BILITOT 0.7 07/13/2022 1235       RADIOGRAPHIC STUDIES:  CT Chest W Contrast  Result Date: 07/13/2022 CLINICAL DATA:  Restaging of non-small-cell lung cancer. Prior left-sided VATS. Wedge resection in 2020. * Tracking Code: BO * EXAM: CT CHEST, ABDOMEN, AND PELVIS WITH CONTRAST TECHNIQUE: Multidetector CT imaging of the chest, abdomen and pelvis was performed following the standard protocol during bolus administration of intravenous contrast. RADIATION DOSE REDUCTION: This exam was performed according to the departmental dose-optimization program which includes automated exposure control, adjustment of the mA and/or kV according to patient size and/or use of iterative reconstruction technique. CONTRAST:  OMNIPAQUE IOHEXOL 300 MG/ML  SOLN COMPARISON:  03/12/2022 FINDINGS: CT CHEST FINDINGS Cardiovascular: Aortic atherosclerosis. Upper normal ascending aortic caliber, 3.8 cm. Status post TAVR. Tortuous descending thoracic aorta. Mild cardiomegaly, without pericardial effusion. Three vessel coronary artery calcification. No central pulmonary embolism, on this non-dedicated study. Mediastinum/Nodes: Small low left jugular/supraclavicular nodes are unchanged and likely reactive. 1.5 cm right paratracheal node on 24/5 105 is  similar to on the prior exam (when remeasured). Node within the azygoesophageal recess is similar at 1.3 cm. Right hilar 1.8 cm node on 27/5 105 is similar to on the prior exam (when remeasured). 1.3 cm left hilar node is similar on 29/5 105. Lungs/Pleura: No pleural fluid. Again identified is interstitial lung disease with basilar and peripheral predominant reticulation, architectural distortion, traction bronchiolectasis, and ground-glass. Felt to be similar. Musculoskeletal: Anterior left fourth rib sclerosis is new since the prior on 34/505. Favored to be related to interval trauma. S shaped thoracolumbar spine curvature. CT ABDOMEN PELVIS FINDINGS Hepatobiliary: Normal liver. Normal gallbladder, without biliary ductal dilatation. Pancreas: Normal, without mass or ductal dilatation. Spleen: Normal in size, without focal abnormality. Adrenals/Urinary Tract: Normal right adrenal gland and right kidney. Mild renal cortical thinning bilaterally is likely within normal variation for age. Left renal too small to characterize lesions are most likely cysts . In the absence of clinically indicated signs/symptoms require(s) no independent follow-up. Left adrenal 1.7 by 1.7 cm nodule is similar to on the prior (when remeasured). No hydronephrosis.  Normal urinary bladder. Stomach/Bowel: Proximal gastric underdistention. Normal colon, appendix, and terminal ileum. Normal small bowel. Vascular/Lymphatic: Aortic atherosclerosis. Aortic stent graft repair. The native sac measures maximally 3.5 x 3.9 cm on 82/505. Compare 4.3 x 3.7 cm at the same level on the prior exam (when remeasured). No abdominopelvic adenopathy. Reproductive: Radiation seeds in the prostate. Symmetric seminal vesicles. Other: No significant free fluid. No free intraperitoneal air. No evidence of omental or peritoneal disease. Musculoskeletal: Mild osteopenia. IMPRESSION: 1. No new or progressive findings to suggest recurrent/metastatic disease. 2. 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 Mild thoracic adenopathy is unchanged, favored to be secondary. 3. Interval anterior left fourth rib sclerosis, likely healing fracture. 4. Ongoing stability of a left adrenal nodule, most consistent with a benign adenoma. 5. Coronary artery atherosclerosis. Aortic Atherosclerosis (ICD10-I70.0). Electronically Signed   By: Jeronimo Greaves M.D.   On: 07/13/2022 18:05   CT Abdomen Pelvis W Contrast  Result Date: 07/13/2022 CLINICAL DATA:  Restaging of non-small-cell lung cancer. Prior left-sided VATS. Wedge resection in 2020. * Tracking Code: BO * EXAM: CT CHEST, ABDOMEN, AND PELVIS WITH CONTRAST TECHNIQUE: Multidetector CT imaging of the chest, abdomen and pelvis was performed following the standard protocol during bolus administration of intravenous contrast. RADIATION DOSE REDUCTION: This exam was performed according to the departmental dose-optimization program  which includes automated exposure control, adjustment of the mA and/or kV according to patient size and/or use of iterative reconstruction technique. CONTRAST:  OMNIPAQUE IOHEXOL 300 MG/ML  SOLN COMPARISON:  03/12/2022 FINDINGS: CT CHEST FINDINGS Cardiovascular: Aortic atherosclerosis. Upper normal ascending aortic caliber, 3.8 cm. Status post TAVR. Tortuous descending thoracic aorta. Mild cardiomegaly, without pericardial effusion. Three vessel coronary artery calcification. No central pulmonary embolism, on this non-dedicated study. Mediastinum/Nodes: Small low left jugular/supraclavicular nodes are unchanged and likely reactive. 1.5 cm right paratracheal node on 24/5 105 is similar to on the prior exam (when remeasured). Node within the azygoesophageal recess is similar at 1.3 cm. Right hilar 1.8 cm node on 27/5 105 is similar to on the prior exam (when remeasured). 1.3 cm left hilar node is similar on 29/5 105. Lungs/Pleura: No pleural fluid. Again  identified is interstitial lung disease with basilar and peripheral predominant reticulation, architectural distortion, traction bronchiolectasis, and ground-glass. Felt to be similar. Musculoskeletal: Anterior left fourth rib sclerosis is new since the prior on 34/505. Favored to be related to interval trauma. S shaped thoracolumbar spine curvature. CT ABDOMEN PELVIS FINDINGS Hepatobiliary: Normal liver. Normal gallbladder, without biliary ductal dilatation. Pancreas: Normal, without mass or ductal dilatation. Spleen: Normal in size, without focal abnormality. Adrenals/Urinary Tract: Normal right adrenal gland and right kidney. Mild renal cortical thinning bilaterally is likely within normal variation for age. Left renal too small to characterize lesions are most likely cysts . In the absence of clinically indicated signs/symptoms require(s) no independent follow-up. Left adrenal 1.7 by 1.7 cm nodule is similar to on the prior (when remeasured). No hydronephrosis.  Normal urinary bladder. Stomach/Bowel: Proximal gastric underdistention. Normal colon, appendix, and terminal ileum. Normal small bowel. Vascular/Lymphatic: Aortic atherosclerosis. Aortic stent graft repair. The native sac measures maximally 3.5 x 3.9 cm on 82/505. Compare 4.3 x 3.7 cm at the same level on the prior exam (when remeasured). No abdominopelvic adenopathy. Reproductive: Radiation seeds in the prostate. Symmetric seminal vesicles. Other: No significant free fluid. No free intraperitoneal air. No evidence of omental or peritoneal disease. Musculoskeletal: Mild osteopenia. IMPRESSION: 1. No new or progressive findings to suggest recurrent/metastatic disease. 2. 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 Mild thoracic adenopathy is unchanged, favored to be secondary. 3. Interval anterior left fourth rib sclerosis, likely healing fracture. 4. Ongoing stability of a left adrenal nodule,  most consistent with a benign adenoma. 5. Coronary artery atherosclerosis. Aortic Atherosclerosis (ICD10-I70.0). Electronically Signed   By: Jeronimo Greaves M.D.   On: 07/13/2022 18:05     ASSESSMENT/PLAN:  This is a very pleasant 82 year old Caucasian male diagnosed with recurrent/metastatic non-small cell lung cancer, adenocarcinoma that was initially diagnosed as stage Ib (T2 a, N0, M0) adenocarcinoma in September 2020 status post wedge resection of the left lower lobe lung nodule and he had recurrence in September 2022 presented with metastatic mediastinal lymphadenopathy in addition to left hilar and left pleural metastatic disease.  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   He underwent 2 cycles of systemic palliative chemotherapy with carboplatin AUC of 5 and Alimta 500 mg per metered squared and Keytruda 200 mg IV every 3 weeks.  He received 2 cycles.  This was discontinued due to receiving the results of his molecular studies which show EGFR mutation.   He was started on treatment with Tarceva 150 mg p.o. daily on 06/28/2021.  He was not a good  candidate for Tagrisso because of his prolonged QT and concern about arrhythmia while on Tagrisso.   His dose was reduced to 100 mg p.o. daily of Tarceva. His dose was reduced in April 2023. He is tolerating this well except for manageable diarrhea.  The patient recently had a restaging CT scan performed.  Dr. Arbutus Ped personally independently reviewed the scan discussed results with the patient today.  The scan showed no evidence of disease progression. Dr. Arbutus Ped recommends that he continue on the same treatment at the same dose..  We will see him back for follow-up visit in  6 weeks for evaluation repeat blood work. We will them see him 6 weeks after that for evaluation and repeat CT scan.   He will continue to take Imodium if needed for diarrhea and use hydrocortisone for his face.   Regarding  his breathing, the patient is active at home and vitals normal. No acute or new findings to explain his mild increase in his cough or dyspnea on exertion. 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 The patient does not have a pulmonologist or use any inhalers. Overall he is functional and cuts wood in the yard at home. Dr. Arbutus Ped mentioned we can periodically prescribe medrol Dosepak to see if it helps his breathing. The patient would like to try one now to see if any improvement. I have sent to pharmacy and he will start this tomorrow morning. I also discussed he can take OTC delsym or robitussin if needed for cough.   The patient was advised to call immediately if he has any concerning symptoms in the interval. The patient voices understanding of current disease status and treatment options and is in agreement with the current care plan. All questions were answered. The patient knows to call the clinic with any problems, questions or concerns. We can certainly see the patient much sooner if necessary       Orders Placed This Encounter  Procedures   CBC with Differential (Cancer Center Only)    Standing Status:   Future    Standing Expiration Date:   07/16/2023   CMP (Cancer Center only)    Standing Status:   Future    Standing Expiration Date:   07/16/2023      Vincent Velez Vincent Riffe, PA-C 07/15/22  ADDENDUM: Hematology/Oncology Attending: I had a face-to-face encounter with the patient today.  I reviewed his record, lab, scan and recommended his care plan.  This is a very pleasant 82 years old white male with recurrent/metastatic non-small cell lung cancer, adenocarcinoma that was initially diagnosed as stage Ib in September 2020 status post wedge resection of the left upper lobe lung nodule but the patient had disease recurrence in September 2022 with metastatic lymphadenopathy as well as left hilar and  left pleural metastatic disease.  He has positive EGFR mutation with deletion in exon 47.  He was initially started systemic chemotherapy with carboplatin, Alimta and Keytruda for 2 cycles before the molecular studies showed positive EGFR mutation and Tagrisso was not used as first-line option because of concern about toxicity following treatment with immunotherapy.  The patient is currently undergoing treatment with Tarceva that initially started at a dose of 150 mg p.o. daily that was reduced after 2 months to Tarceva 100 mg p.o. daily secondary to intolerance with diarrhea and skin rash.  He is status post 8 months of treatment and has been tolerating it fairly well.  The patient had repeat CT scan of the chest, abdomen and pelvis performed recently.  I personally and independently reviewed the scan and discussed results with the patient and his daughter today. His scan showed no concerning findings for disease progression. I recommended for him to continue his current treatment with Tarceva with the same dose. He will come back for follow-up visit in 6 weeks for evaluation and repeat blood work. He was advised to call immediately if he has any concerning symptoms in the interval. The total time spent in the appointment was 30 minutes. Disclaimer: This note was dictated with voice recognition software. Similar sounding words can inadvertently be transcribed and may be missed upon review. Lajuana Matte, MD

## 2022-07-13 ENCOUNTER — Encounter (HOSPITAL_COMMUNITY): Payer: Self-pay

## 2022-07-13 ENCOUNTER — Other Ambulatory Visit: Payer: Self-pay

## 2022-07-13 ENCOUNTER — Ambulatory Visit (HOSPITAL_COMMUNITY)
Admission: RE | Admit: 2022-07-13 | Discharge: 2022-07-13 | Disposition: A | Discharge status: Home / Self Care | Payer: Medicare Other | Source: Ambulatory Visit | Attending: Internal Medicine | Admitting: Internal Medicine

## 2022-07-13 ENCOUNTER — Inpatient Hospital Stay: Payer: Medicare Other | Attending: Physician Assistant

## 2022-07-13 ENCOUNTER — Other Ambulatory Visit: Payer: No Typology Code available for payment source

## 2022-07-13 DIAGNOSIS — C3490 Malignant neoplasm of unspecified part of unspecified bronchus or lung: Secondary | ICD-10-CM

## 2022-07-13 DIAGNOSIS — Z800 Family history of malignant neoplasm of digestive organs: Secondary | ICD-10-CM | POA: Insufficient documentation

## 2022-07-13 DIAGNOSIS — Z7901 Long term (current) use of anticoagulants: Secondary | ICD-10-CM | POA: Insufficient documentation

## 2022-07-13 DIAGNOSIS — J8410 Pulmonary fibrosis, unspecified: Secondary | ICD-10-CM | POA: Insufficient documentation

## 2022-07-13 DIAGNOSIS — Z79899 Other long term (current) drug therapy: Secondary | ICD-10-CM | POA: Insufficient documentation

## 2022-07-13 DIAGNOSIS — Z923 Personal history of irradiation: Secondary | ICD-10-CM | POA: Insufficient documentation

## 2022-07-13 DIAGNOSIS — Z8546 Personal history of malignant neoplasm of prostate: Secondary | ICD-10-CM | POA: Insufficient documentation

## 2022-07-13 DIAGNOSIS — I700 Atherosclerosis of aorta: Secondary | ICD-10-CM | POA: Insufficient documentation

## 2022-07-13 DIAGNOSIS — J479 Bronchiectasis, uncomplicated: Secondary | ICD-10-CM | POA: Insufficient documentation

## 2022-07-13 DIAGNOSIS — R197 Diarrhea, unspecified: Secondary | ICD-10-CM | POA: Insufficient documentation

## 2022-07-13 DIAGNOSIS — C3412 Malignant neoplasm of upper lobe, left bronchus or lung: Secondary | ICD-10-CM | POA: Insufficient documentation

## 2022-07-13 DIAGNOSIS — R590 Localized enlarged lymph nodes: Secondary | ICD-10-CM | POA: Insufficient documentation

## 2022-07-13 DIAGNOSIS — M8580 Other specified disorders of bone density and structure, unspecified site: Secondary | ICD-10-CM | POA: Insufficient documentation

## 2022-07-13 DIAGNOSIS — J4489 Other specified chronic obstructive pulmonary disease: Secondary | ICD-10-CM | POA: Insufficient documentation

## 2022-07-13 DIAGNOSIS — C782 Secondary malignant neoplasm of pleura: Secondary | ICD-10-CM | POA: Insufficient documentation

## 2022-07-13 DIAGNOSIS — R21 Rash and other nonspecific skin eruption: Secondary | ICD-10-CM | POA: Insufficient documentation

## 2022-07-13 DIAGNOSIS — I2510 Atherosclerotic heart disease of native coronary artery without angina pectoris: Secondary | ICD-10-CM | POA: Insufficient documentation

## 2022-07-13 LAB — CMP (CANCER CENTER ONLY)
ALT: 16 U/L (ref 0–44)
AST: 27 U/L (ref 15–41)
Albumin: 4.2 g/dL (ref 3.5–5.0)
Alkaline Phosphatase: 103 U/L (ref 38–126)
Anion gap: 8 (ref 5–15)
BUN: 19 mg/dL (ref 8–23)
CO2: 25 mmol/L (ref 22–32)
Calcium: 10 mg/dL (ref 8.9–10.3)
Chloride: 103 mmol/L (ref 98–111)
Creatinine: 0.91 mg/dL (ref 0.61–1.24)
GFR, Estimated: >60 mL/min (ref >60)
Glucose, Bld: 133 mg/dL — ABNORMAL HIGH (ref 70–99)
Potassium: 4.2 mmol/L (ref 3.5–5.1)
Sodium: 136 mmol/L (ref 135–145)
Total Bilirubin: 0.7 mg/dL (ref 0.3–1.2)
Total Protein: 8.1 g/dL (ref 6.5–8.1)

## 2022-07-13 LAB — CBC WITH DIFFERENTIAL (CANCER CENTER ONLY)
Abs Immature Granulocytes: 0.07 10*3/uL (ref 0.00–0.07)
Basophils Absolute: 0.1 10*3/uL (ref 0.0–0.1)
Basophils Relative: 1 %
Eosinophils Absolute: 0.5 10*3/uL (ref 0.0–0.5)
Eosinophils Relative: 5 %
HCT: 41.9 % (ref 39.0–52.0)
Hemoglobin: 13.9 g/dL (ref 13.0–17.0)
Immature Granulocytes: 1 %
Lymphocytes Relative: 20 %
Lymphs Abs: 1.7 10*3/uL (ref 0.7–4.0)
MCH: 28.9 pg (ref 26.0–34.0)
MCHC: 33.2 g/dL (ref 30.0–36.0)
MCV: 87.1 fL (ref 80.0–100.0)
Monocytes Absolute: 0.8 10*3/uL (ref 0.1–1.0)
Monocytes Relative: 9 %
Neutro Abs: 5.5 10*3/uL (ref 1.7–7.7)
Neutrophils Relative %: 64 %
Platelet Count: 145 10*3/uL — ABNORMAL LOW (ref 150–400)
RBC: 4.81 MIL/uL (ref 4.22–5.81)
RDW: 15.5 % (ref 11.5–15.5)
WBC Count: 8.5 10*3/uL (ref 4.0–10.5)
nRBC: 0 % (ref 0.0–0.2)

## 2022-07-13 MED ORDER — SODIUM CHLORIDE (PF) 0.9 % IJ SOLN
INTRAMUSCULAR | Status: AC
  Filled 2022-07-13: qty 50

## 2022-07-13 MED ORDER — IOHEXOL 300 MG/ML  SOLN
100.0000 mL | Freq: Once | INTRAMUSCULAR | Status: AC | PRN
  Administered 2022-07-13: 100 mL via INTRAVENOUS

## 2022-07-15 ENCOUNTER — Inpatient Hospital Stay: Payer: Medicare Other | Admitting: Physician Assistant

## 2022-07-15 ENCOUNTER — Other Ambulatory Visit: Payer: Self-pay

## 2022-07-15 VITALS — BP 117/67 | HR 66 | Temp 97.6°F | Resp 16 | Wt 160.4 lb

## 2022-07-15 DIAGNOSIS — C3492 Malignant neoplasm of unspecified part of left bronchus or lung: Secondary | ICD-10-CM | POA: Diagnosis not present

## 2022-07-15 DIAGNOSIS — Z800 Family history of malignant neoplasm of digestive organs: Secondary | ICD-10-CM | POA: Diagnosis not present

## 2022-07-15 DIAGNOSIS — J479 Bronchiectasis, uncomplicated: Secondary | ICD-10-CM | POA: Diagnosis not present

## 2022-07-15 DIAGNOSIS — I2510 Atherosclerotic heart disease of native coronary artery without angina pectoris: Secondary | ICD-10-CM | POA: Diagnosis not present

## 2022-07-15 DIAGNOSIS — J4489 Other specified chronic obstructive pulmonary disease: Secondary | ICD-10-CM | POA: Diagnosis not present

## 2022-07-15 DIAGNOSIS — Z8546 Personal history of malignant neoplasm of prostate: Secondary | ICD-10-CM | POA: Diagnosis not present

## 2022-07-15 DIAGNOSIS — R590 Localized enlarged lymph nodes: Secondary | ICD-10-CM | POA: Diagnosis not present

## 2022-07-15 DIAGNOSIS — C782 Secondary malignant neoplasm of pleura: Secondary | ICD-10-CM | POA: Diagnosis not present

## 2022-07-15 DIAGNOSIS — C3412 Malignant neoplasm of upper lobe, left bronchus or lung: Secondary | ICD-10-CM | POA: Diagnosis present

## 2022-07-15 DIAGNOSIS — J8410 Pulmonary fibrosis, unspecified: Secondary | ICD-10-CM | POA: Diagnosis not present

## 2022-07-15 DIAGNOSIS — R197 Diarrhea, unspecified: Secondary | ICD-10-CM | POA: Diagnosis not present

## 2022-07-15 DIAGNOSIS — Z923 Personal history of irradiation: Secondary | ICD-10-CM | POA: Diagnosis not present

## 2022-07-15 DIAGNOSIS — Z79899 Other long term (current) drug therapy: Secondary | ICD-10-CM | POA: Diagnosis not present

## 2022-07-15 DIAGNOSIS — Z7901 Long term (current) use of anticoagulants: Secondary | ICD-10-CM | POA: Diagnosis not present

## 2022-07-15 DIAGNOSIS — M8580 Other specified disorders of bone density and structure, unspecified site: Secondary | ICD-10-CM | POA: Diagnosis not present

## 2022-07-15 DIAGNOSIS — I700 Atherosclerosis of aorta: Secondary | ICD-10-CM | POA: Diagnosis not present

## 2022-07-15 DIAGNOSIS — R21 Rash and other nonspecific skin eruption: Secondary | ICD-10-CM | POA: Diagnosis not present

## 2022-07-15 MED ORDER — METHYLPREDNISOLONE 4 MG PO TBPK: ORAL_TABLET | ORAL | 0 refills | Status: DC

## 2022-07-21 ENCOUNTER — Other Ambulatory Visit (HOSPITAL_COMMUNITY): Payer: Self-pay

## 2022-07-24 ENCOUNTER — Other Ambulatory Visit (HOSPITAL_COMMUNITY): Payer: Self-pay

## 2022-08-03 ENCOUNTER — Telehealth: Payer: Self-pay | Admitting: Internal Medicine

## 2022-08-03 NOTE — Telephone Encounter (Signed)
Called patient regarding March appointment, patient is notified.

## 2022-08-20 ENCOUNTER — Other Ambulatory Visit (HOSPITAL_COMMUNITY): Payer: Self-pay

## 2022-08-20 ENCOUNTER — Other Ambulatory Visit: Payer: Self-pay | Admitting: Physician Assistant

## 2022-08-20 DIAGNOSIS — C3492 Malignant neoplasm of unspecified part of left bronchus or lung: Secondary | ICD-10-CM

## 2022-08-20 MED ORDER — ERLOTINIB HCL 100 MG PO TABS
100.0000 mg | ORAL_TABLET | Freq: Every day | ORAL | 3 refills | Status: DC
  Filled 2022-08-20: qty 30, 30d supply, fill #0
  Filled 2022-09-21: qty 30, 30d supply, fill #1
  Filled 2022-10-22: qty 30, 30d supply, fill #2
  Filled 2023-01-13: qty 30, 30d supply, fill #3

## 2022-08-24 ENCOUNTER — Other Ambulatory Visit: Payer: Self-pay

## 2022-08-24 ENCOUNTER — Inpatient Hospital Stay: Payer: Medicare Other | Attending: Physician Assistant

## 2022-08-24 ENCOUNTER — Inpatient Hospital Stay (HOSPITAL_BASED_OUTPATIENT_CLINIC_OR_DEPARTMENT_OTHER): Payer: Medicare Other | Admitting: Internal Medicine

## 2022-08-24 VITALS — BP 112/65 | HR 66 | Temp 97.6°F | Resp 16 | Wt 158.1 lb

## 2022-08-24 DIAGNOSIS — C3490 Malignant neoplasm of unspecified part of unspecified bronchus or lung: Secondary | ICD-10-CM

## 2022-08-24 DIAGNOSIS — Z8546 Personal history of malignant neoplasm of prostate: Secondary | ICD-10-CM | POA: Insufficient documentation

## 2022-08-24 DIAGNOSIS — Z800 Family history of malignant neoplasm of digestive organs: Secondary | ICD-10-CM | POA: Insufficient documentation

## 2022-08-24 DIAGNOSIS — R21 Rash and other nonspecific skin eruption: Secondary | ICD-10-CM | POA: Insufficient documentation

## 2022-08-24 DIAGNOSIS — C782 Secondary malignant neoplasm of pleura: Secondary | ICD-10-CM | POA: Diagnosis not present

## 2022-08-24 DIAGNOSIS — I4891 Unspecified atrial fibrillation: Secondary | ICD-10-CM | POA: Insufficient documentation

## 2022-08-24 DIAGNOSIS — Z7901 Long term (current) use of anticoagulants: Secondary | ICD-10-CM | POA: Insufficient documentation

## 2022-08-24 DIAGNOSIS — C3412 Malignant neoplasm of upper lobe, left bronchus or lung: Secondary | ICD-10-CM | POA: Diagnosis not present

## 2022-08-24 DIAGNOSIS — R5383 Other fatigue: Secondary | ICD-10-CM | POA: Diagnosis not present

## 2022-08-24 DIAGNOSIS — Z79899 Other long term (current) drug therapy: Secondary | ICD-10-CM | POA: Diagnosis not present

## 2022-08-24 DIAGNOSIS — C3492 Malignant neoplasm of unspecified part of left bronchus or lung: Secondary | ICD-10-CM

## 2022-08-24 LAB — CBC WITH DIFFERENTIAL (CANCER CENTER ONLY)
Abs Immature Granulocytes: 0.04 10*3/uL (ref 0.00–0.07)
Basophils Absolute: 0.1 10*3/uL (ref 0.0–0.1)
Basophils Relative: 1 %
Eosinophils Absolute: 0.3 10*3/uL (ref 0.0–0.5)
Eosinophils Relative: 4 %
HCT: 42.1 % (ref 39.0–52.0)
Hemoglobin: 13.9 g/dL (ref 13.0–17.0)
Immature Granulocytes: 1 %
Lymphocytes Relative: 20 %
Lymphs Abs: 1.4 10*3/uL (ref 0.7–4.0)
MCH: 29 pg (ref 26.0–34.0)
MCHC: 33 g/dL (ref 30.0–36.0)
MCV: 87.9 fL (ref 80.0–100.0)
Monocytes Absolute: 0.7 10*3/uL (ref 0.1–1.0)
Monocytes Relative: 10 %
Neutro Abs: 4.8 10*3/uL (ref 1.7–7.7)
Neutrophils Relative %: 64 %
Platelet Count: 137 10*3/uL — ABNORMAL LOW (ref 150–400)
RBC: 4.79 MIL/uL (ref 4.22–5.81)
RDW: 15.2 % (ref 11.5–15.5)
WBC Count: 7.3 10*3/uL (ref 4.0–10.5)
nRBC: 0 % (ref 0.0–0.2)

## 2022-08-24 LAB — CMP (CANCER CENTER ONLY)
ALT: 21 U/L (ref 0–44)
AST: 27 U/L (ref 15–41)
Albumin: 4.2 g/dL (ref 3.5–5.0)
Alkaline Phosphatase: 101 U/L (ref 38–126)
Anion gap: 8 (ref 5–15)
BUN: 23 mg/dL (ref 8–23)
CO2: 26 mmol/L (ref 22–32)
Calcium: 10.1 mg/dL (ref 8.9–10.3)
Chloride: 103 mmol/L (ref 98–111)
Creatinine: 1.02 mg/dL (ref 0.61–1.24)
GFR, Estimated: >60 mL/min (ref >60)
Glucose, Bld: 167 mg/dL — ABNORMAL HIGH (ref 70–99)
Potassium: 4.1 mmol/L (ref 3.5–5.1)
Sodium: 137 mmol/L (ref 135–145)
Total Bilirubin: 0.9 mg/dL (ref 0.3–1.2)
Total Protein: 7.6 g/dL (ref 6.5–8.1)

## 2022-08-24 NOTE — Progress Notes (Signed)
[place] [phone]   [phone]  OFFICE PROGRESS NOTE  [name], MD [address] [place] [address]  DIAGNOSIS: Recurrent/metastatic non-small cell lung cancer, adenocarcinoma initially diagnosed as a stage Ib (T2 a, N0, M0) adenocarcinoma in September 2020 status post wedge resection of the left upper lobe nodule.  The patient has disease recurrence and September 2022 with metastatic mediastinal lymphadenopathy as well as left hilar and left pleural metastatic disease   PD-L1 expression was negative.   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  First dose April 30, 2021. Status post 2 cycles. Discontinued due to molecular studies positive for EGFR mutation   CURRENT THERAPY: Tarceva 150 mg p.o. daily started June 28, 2021.  Status post 2 month of treatment.  His dose was changed to Tarceva 100 mg p.o. daily on 09/04/2021 secondary to intolerance and frequent skin rash.  Status post 11 months of treatment.  INTERVAL HISTORY: Vincent Velez 82 y.o. male returns for clinic today for follow-up visit accompanied by his daughter.  The patient is feeling fine today with no concerning complaints except for mild fatigue.  He has no nausea, vomiting, diarrhea or constipation.  He has no chest pain, shortness of breath, cough or hemoptysis.  He has no recent weight loss or night sweats.  He continues to tolerate his treatment with Tarceva fairly well.  He is here today for evaluation and repeat blood work.  MEDICAL HISTORY: Past Medical History:  Diagnosis Date   AAA (abdominal aortic  aneurysm) ([place])    Anxiety    Arthritis    Asthma    when younger   Atrial fibrillation ([facility])    CAD (coronary artery disease)    Cataract    removed both   COPD (chronic obstructive pulmonary disease) ([facility])    Diabetes mellitus    Type II   Dysrhythmia    afib   Esophageal reflux    Family history of malignant neoplasm of gastrointestinal tract    Heart murmur    Hiatal hernia    Hyperlipemia    Hypertension    Lung nodule    a. PET scan highly suspicious for malignancy. Bronch with biopsy to be done after TAVR   Malignant neoplasm of prostate [facility])    prostate    Peripheral vascular disease ([place])    Pneumonia    Stricture and stenosis of esophagus     ALLERGIES:  is allergic to macrolides and ketolides.  MEDICATIONS:  Current Outpatient Medications  Medication Sig Dispense Refill   ACCU-CHEK GUIDE test strip USE TO CHECK YOUR BLOOD SUGAR ONCE DAILY     acetaminophen (TYLENOL) 500 MG tablet Take 2 tablets (1,000 mg total) by mouth every 6 (six) hours as needed for mild pain or fever. 30 tablet 0   diltiazem (CARDIZEM SR) 60 MG 12 hr capsule Take 1 capsule (60 mg total) by mouth every 12 (twelve) hours. 60 capsule 7   dorzolamide-timolol (COSOPT) 22.3-6.8 MG/ML ophthalmic solution Place 1 drop into both eyes in the morning, at noon, and at bedtime.     erlotinib (TARCEVA) 100  MG tablet Take 1 tablet (100 mg total) by mouth daily. Take on an empty stomach 1 hour before meals or 2 hours after. 30 tablet 3   glimepiride (AMARYL) 2 MG tablet Take 2 mg by mouth daily with breakfast.     JARDIANCE 25 MG TABS tablet Take 25 mg by mouth daily.     metFORMIN (GLUCOPHAGE-XR) 500 MG 24 hr tablet Take 1,000 mg by mouth every evening.      methylPREDNISolone (MEDROL DOSEPAK) 4 MG TBPK tablet Use as instructed 21 tablet 0   metoprolol succinate (TOPROL-XL) 25 MG 24 hr tablet Take 50 mg by mouth daily.     simvastatin (ZOCOR) 40 MG tablet Take 40 mg by mouth at bedtime.     XARELTO 20  MG TABS tablet TAKE 1 TABLET BY MOUTH  DAILY WITH SUPPER 90 tablet 1   Multiple Vitamins-Minerals (MULTIVITAMIN WITH MINERALS) tablet Take 1 tablet by mouth daily. (Patient not taking: Reported on 08/24/2022)     Multiple Vitamins-Minerals (ZINC PO) Take 1 tablet by mouth daily. (Patient not taking: Reported on 08/24/2022)     prochlorperazine (COMPAZINE) 10 MG tablet Take 1 tablet (10 mg total) by mouth every 6 (six) hours as needed for nausea or vomiting. (Patient not taking: Reported on 08/24/2022) 30 tablet 0   No current facility-administered medications for this visit.    SURGICAL HISTORY:  Past Surgical History:  Procedure Laterality Date   ABDOMINAL AORTA STENT     CARDIAC CATHETERIZATION     CARDIOVERSION N/A 12/04/2015   Procedure: CARDIOVERSION;  Surgeon: Sanda Klein, MD;  Location: [place] [facility];  Service: Cardiovascular;  Laterality: N/A;   COLONOSCOPY     FINGER SURGERY Right    middle finger- amputation   INSERTION PROSTATE RADIATION SEED     MEDIASTINOSCOPY N/A 01/06/2019   Procedure: MEDIASTINOSCOPY WITH BIOPIES;  Surgeon: Gaye Pollack, MD;  Location: [place];  Service: Thoracic;  Laterality: N/A;   RIGHT/LEFT HEART CATH AND CORONARY ANGIOGRAPHY N/A 10/26/2018   Procedure: RIGHT/LEFT HEART CATH AND CORONARY ANGIOGRAPHY;  Surgeon: Burnell Blanks, MD;  Location: [place] CV LAB;  Service: Cardiovascular;  Laterality: N/A;   TEE WITHOUT CARDIOVERSION N/A 12/06/2018   Procedure: TRANSESOPHAGEAL ECHOCARDIOGRAM (TEE);  Surgeon: Burnell Blanks, MD;  Location: [place] CV LAB;  Service: Open Heart Surgery;  Laterality: N/A;   TRANSCATHETER AORTIC VALVE REPLACEMENT, TRANSFEMORAL N/A 12/06/2018   Procedure: TRANSCATHETER AORTIC VALVE REPLACEMENT, TRANSFEMORAL;  Surgeon: Burnell Blanks, MD;  Location: [place] CV LAB;  Service: Open Heart Surgery;  Laterality: N/A;   UPPER GASTROINTESTINAL ENDOSCOPY     VIDEO ASSISTED THORACOSCOPY (VATS)/THOROCOTOMY Left  02/02/2019   Procedure: VIDEO ASSISTED THORACOSCOPY (VATS)/THOROCOTOMY;  Surgeon: Gaye Pollack, MD;  Location: [place] OR;  Service: Thoracic;  Laterality: Left;   VIDEO BRONCHOSCOPY N/A 01/06/2019   Procedure: VIDEO BRONCHOSCOPY;  Surgeon: Gaye Pollack, MD;  Location: [place] OR;  Service: Thoracic;  Laterality: N/A;   VIDEO BRONCHOSCOPY WITH ENDOBRONCHIAL ULTRASOUND N/A 03/07/2021   Procedure: VIDEO BRONCHOSCOPY WITH ENDOBRONCHIAL ULTRASOUND;  Surgeon: [name], MD;  Location: [place];  Service: Thoracic;  Laterality: N/A;   WEDGE RESECTION Left 02/02/2019   Procedure: LUNG RESECTION;  Surgeon: Gaye Pollack, MD;  Location: [facility];  Service: Thoracic;  Laterality: Left;    REVIEW OF SYSTEMS:  A comprehensive review of systems was negative except for: Constitutional: positive for fatigue   PHYSICAL EXAMINATION: General appearance: alert, cooperative, fatigued, and no distress Head: Normocephalic,  without obvious abnormality, atraumatic Neck: no adenopathy, no JVD, supple, symmetrical, trachea midline, and thyroid not enlarged, symmetric, no tenderness/mass/nodules Lymph nodes: Cervical, supraclavicular, and axillary nodes normal. Resp: clear to auscultation bilaterally Back: symmetric, no curvature. ROM normal. No CVA tenderness. Cardio: irregularly irregular rhythm GI: soft, non-tender; bowel sounds normal; no masses,  no organomegaly Extremities: extremities normal, atraumatic, no cyanosis or edema  ECOG PERFORMANCE STATUS: 1 - Symptomatic but completely ambulatory  Blood pressure 112/65, pulse 66, temperature 97.6 F (36.4 C), temperature source Temporal, resp. rate 16, weight 158 lb 1.6 oz (71.7 kg), SpO2 96 %.  LABORATORY DATA: Lab Results  Component Value Date   WBC 7.3 08/24/2022   HGB 13.9 08/24/2022   HCT 42.1 08/24/2022   MCV 87.9 08/24/2022   PLT 137 (L) 08/24/2022      Chemistry      Component Value Date/Time   NA 136 07/13/2022 1235   NA 138 12/06/2019 1438    K 4.2 07/13/2022 1235   CL 103 07/13/2022 1235   CO2 25 07/13/2022 1235   BUN 19 07/13/2022 1235   BUN 14 12/06/2019 1438   CREATININE 0.91 07/13/2022 1235   CREATININE 0.81 11/29/2015 1335      Component Value Date/Time   CALCIUM 10.0 07/13/2022 1235   ALKPHOS 103 07/13/2022 1235   AST 27 07/13/2022 1235   ALT 16 07/13/2022 1235   BILITOT 0.7 07/13/2022 1235       RADIOGRAPHIC STUDIES: No results found.  ASSESSMENT AND PLAN:  This is a very pleasant 82 years old white male recently diagnosed with recurrent/metastatic non-small cell lung cancer, adenocarcinoma that was initially diagnosed as stage Ib (T2 a, N0, M0) adenocarcinoma in September 2020 status post wedge resection of the left lower lobe lung nodule and he has recurrence in September 2022 presented with metastatic mediastinal lymphadenopathy in addition to left hilar and left pleural metastatic disease. The patient had molecular studies by foundation 1 but there was insufficient material for the molecular test.  His PD-L1 expression was negative.  I will ask the pathology department to send his original resected tumor in 2020 for molecular studies as it may have enough tissue for testing. The patient started systemic chemotherapy with carboplatin for AUC of 5, Alimta 500 Mg/M2 and Keytruda 200 Mg IV every 3 weeks on April 30, 2021.  Status post 2 cycles.  He had evidence for disease progression after the first 2 cycles of his treatment. He had repeat CT scan of the chest that showed progressive metastatic disease throughout the pleura of the left hemothorax with new right hilar lymphadenopathy and increasing prevascular lymphadenopathy.  His treatment with chemotherapy was discontinued. Molecular studies from his resected tumor in September 2020 showed positive EGFR mutation, exon 66. The patient started treatment with Tarceva 150 mg p.o. daily on June 28, 2021.  He was not a good candidate for treatment with Tagrisso  because of prolonged QT interval and concern about arrhythmia with Tagrisso. He has been on treatment with Tarceva 100 mg p.o. daily status post 11 months. The patient has been tolerating his treatment fairly well with no concerning adverse effects. I recommended for him to continue his current treatment with Tarceva with the same dose. I will see him back for follow-up visit in 1 months for evaluation and repeat blood work. The patient was advised to call immediately if he has any other concerning symptoms in the interval.  The patient voices understanding of current disease status and treatment options  and is in agreement with the current care plan.  All questions were answered. The patient knows to call the clinic with any problems, questions or concerns. We can certainly see the patient much sooner if necessary.  Disclaimer: This note was dictated with voice recognition software. Similar sounding words can inadvertently be transcribed and may not be corrected upon review.

## 2022-08-25 ENCOUNTER — Other Ambulatory Visit: Payer: Medicare Other

## 2022-08-25 ENCOUNTER — Ambulatory Visit: Payer: Medicare Other | Admitting: Internal Medicine

## 2022-08-26 ENCOUNTER — Other Ambulatory Visit: Payer: Self-pay

## 2022-08-26 ENCOUNTER — Other Ambulatory Visit (HOSPITAL_COMMUNITY): Payer: Self-pay

## 2022-09-10 ENCOUNTER — Telehealth: Payer: Self-pay | Admitting: Internal Medicine

## 2022-09-10 NOTE — Telephone Encounter (Signed)
Called patient regarding April appointments, left a voicemail.  

## 2022-09-21 ENCOUNTER — Other Ambulatory Visit: Payer: Self-pay

## 2022-09-24 ENCOUNTER — Other Ambulatory Visit: Payer: Self-pay

## 2022-09-24 ENCOUNTER — Inpatient Hospital Stay: Payer: Medicare Other | Attending: Physician Assistant

## 2022-09-24 ENCOUNTER — Inpatient Hospital Stay: Payer: Medicare Other | Admitting: Internal Medicine

## 2022-09-24 VITALS — BP 131/77 | HR 51 | Temp 97.2°F | Resp 17 | Ht 69.0 in | Wt 155.2 lb

## 2022-09-24 DIAGNOSIS — Z923 Personal history of irradiation: Secondary | ICD-10-CM | POA: Diagnosis not present

## 2022-09-24 DIAGNOSIS — C3412 Malignant neoplasm of upper lobe, left bronchus or lung: Secondary | ICD-10-CM | POA: Insufficient documentation

## 2022-09-24 DIAGNOSIS — C3490 Malignant neoplasm of unspecified part of unspecified bronchus or lung: Secondary | ICD-10-CM

## 2022-09-24 DIAGNOSIS — C782 Secondary malignant neoplasm of pleura: Secondary | ICD-10-CM | POA: Insufficient documentation

## 2022-09-24 DIAGNOSIS — Z79899 Other long term (current) drug therapy: Secondary | ICD-10-CM | POA: Diagnosis not present

## 2022-09-24 DIAGNOSIS — Z7901 Long term (current) use of anticoagulants: Secondary | ICD-10-CM | POA: Insufficient documentation

## 2022-09-24 DIAGNOSIS — C771 Secondary and unspecified malignant neoplasm of intrathoracic lymph nodes: Secondary | ICD-10-CM | POA: Insufficient documentation

## 2022-09-24 DIAGNOSIS — Z8546 Personal history of malignant neoplasm of prostate: Secondary | ICD-10-CM | POA: Insufficient documentation

## 2022-09-24 DIAGNOSIS — R197 Diarrhea, unspecified: Secondary | ICD-10-CM | POA: Diagnosis not present

## 2022-09-24 DIAGNOSIS — Z800 Family history of malignant neoplasm of digestive organs: Secondary | ICD-10-CM | POA: Diagnosis not present

## 2022-09-24 DIAGNOSIS — R21 Rash and other nonspecific skin eruption: Secondary | ICD-10-CM | POA: Insufficient documentation

## 2022-09-24 LAB — CBC WITH DIFFERENTIAL (CANCER CENTER ONLY)
Abs Immature Granulocytes: 0.06 10*3/uL (ref 0.00–0.07)
Basophils Absolute: 0.1 10*3/uL (ref 0.0–0.1)
Basophils Relative: 1 %
Eosinophils Absolute: 0.4 10*3/uL (ref 0.0–0.5)
Eosinophils Relative: 5 %
HCT: 42 % (ref 39.0–52.0)
Hemoglobin: 13.7 g/dL (ref 13.0–17.0)
Immature Granulocytes: 1 %
Lymphocytes Relative: 23 %
Lymphs Abs: 1.7 10*3/uL (ref 0.7–4.0)
MCH: 29.5 pg (ref 26.0–34.0)
MCHC: 32.6 g/dL (ref 30.0–36.0)
MCV: 90.3 fL (ref 80.0–100.0)
Monocytes Absolute: 0.8 10*3/uL (ref 0.1–1.0)
Monocytes Relative: 11 %
Neutro Abs: 4.4 10*3/uL (ref 1.7–7.7)
Neutrophils Relative %: 59 %
Platelet Count: 147 10*3/uL — ABNORMAL LOW (ref 150–400)
RBC: 4.65 MIL/uL (ref 4.22–5.81)
RDW: 15.3 % (ref 11.5–15.5)
WBC Count: 7.4 10*3/uL (ref 4.0–10.5)
nRBC: 0 % (ref 0.0–0.2)

## 2022-09-24 LAB — CMP (CANCER CENTER ONLY)
ALT: 18 U/L (ref 0–44)
AST: 25 U/L (ref 15–41)
Albumin: 4.2 g/dL (ref 3.5–5.0)
Alkaline Phosphatase: 94 U/L (ref 38–126)
Anion gap: 6 (ref 5–15)
BUN: 26 mg/dL — ABNORMAL HIGH (ref 8–23)
CO2: 26 mmol/L (ref 22–32)
Calcium: 9.8 mg/dL (ref 8.9–10.3)
Chloride: 103 mmol/L (ref 98–111)
Creatinine: 0.98 mg/dL (ref 0.61–1.24)
GFR, Estimated: >60 mL/min (ref >60)
Glucose, Bld: 174 mg/dL — ABNORMAL HIGH (ref 70–99)
Potassium: 4.2 mmol/L (ref 3.5–5.1)
Sodium: 135 mmol/L (ref 135–145)
Total Bilirubin: 1.2 mg/dL (ref 0.3–1.2)
Total Protein: 7.6 g/dL (ref 6.5–8.1)

## 2022-09-24 NOTE — Progress Notes (Signed)
[facility] Telephone:([phone]   Fax:([phone]) [phone]  OFFICE PROGRESS NOTE  [name], MD [address] [place] [address]  DIAGNOSIS: Recurrent/metastatic non-small cell lung cancer, adenocarcinoma initially diagnosed as a stage Ib (T2 a, N0, M0) adenocarcinoma in September 2020 status post wedge resection of the left upper lobe nodule.  The patient has disease recurrence and September 2022 with metastatic mediastinal lymphadenopathy as well as left hilar and left pleural metastatic disease   PD-L1 expression was negative.   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  First dose April 30, 2021. Status post 2 cycles. Discontinued due to molecular studies positive for EGFR mutation   CURRENT THERAPY: Tarceva 150 mg p.o. daily started June 28, 2021.  Status post 2 month of treatment.  His dose was changed to Tarceva 100 mg p.o. daily on 09/04/2021 secondary to intolerance and frequent skin rash.  Status post 12 months of treatment.  INTERVAL HISTORY: Vincent Velez 82 y.o. male returns to the clinic today for follow-up visit accompanied by his daughter.  The patient is feeling fine today with no concerning complaints except for mild diarrhea improved with Imodium.  He also has fatigue and lack of stamina recently.  He does not exercise at regular basis.  He denied having any current chest pain, shortness of breath, cough or hemoptysis.  He has no nausea, vomiting, diarrhea or constipation.  He has no headache or visual changes.  He has no recent weight loss or night sweats.  He continues to tolerate his  treatment with Erlotinib fairly well.  He is here today for evaluation and repeat blood work.  MEDICAL HISTORY: Past Medical History:  Diagnosis Date   AAA (abdominal aortic aneurysm) ([facility])    Anxiety    Arthritis    Asthma    when younger   Atrial fibrillation ([facility])    CAD (coronary artery disease)    Cataract    removed both   COPD (chronic obstructive pulmonary disease) ([facility])    Diabetes mellitus    Type II   Dysrhythmia    afib   Esophageal reflux    Family history of malignant neoplasm of gastrointestinal tract    Heart murmur    Hiatal hernia    Hyperlipemia    Hypertension    Lung nodule    a. PET scan highly suspicious for malignancy. Bronch with biopsy to be done after TAVR   Malignant neoplasm of prostate [facility])    prostate    Peripheral vascular disease ([facility])    Pneumonia    Stricture and stenosis of esophagus     ALLERGIES:  is allergic to macrolides and ketolides.  MEDICATIONS:  Current Outpatient Medications  Medication Sig Dispense Refill   ACCU-CHEK GUIDE test strip USE TO CHECK YOUR BLOOD SUGAR ONCE DAILY     acetaminophen (TYLENOL) 500 MG tablet Take 2 tablets (1,000 mg total) by mouth every 6 (six) hours as needed for mild pain or fever. 30 tablet 0   diltiazem (CARDIZEM SR) 60 MG 12 hr capsule Take 1 capsule (60 mg total) by mouth every 12 (twelve) hours.  60 capsule 7   dorzolamide-timolol (COSOPT) 22.3-6.8 MG/ML ophthalmic solution Place 1 drop into both eyes in the morning, at noon, and at bedtime.     erlotinib (TARCEVA) 100 MG tablet Take 1 tablet (100 mg total) by mouth daily. Take on an empty stomach 1 hour before meals or 2 hours after. 30 tablet 3   glimepiride (AMARYL) 2 MG tablet Take 2 mg by mouth daily with breakfast.     JARDIANCE 25 MG TABS tablet Take 25 mg by mouth daily.     metFORMIN (GLUCOPHAGE-XR) 500 MG 24 hr tablet Take 1,000 mg by mouth every evening.      methylPREDNISolone (MEDROL DOSEPAK) 4 MG TBPK tablet Use as instructed 21  tablet 0   metoprolol succinate (TOPROL-XL) 25 MG 24 hr tablet Take 50 mg by mouth daily.     Multiple Vitamins-Minerals (MULTIVITAMIN WITH MINERALS) tablet Take 1 tablet by mouth daily. (Patient not taking: Reported on 08/24/2022)     Multiple Vitamins-Minerals (ZINC PO) Take 1 tablet by mouth daily. (Patient not taking: Reported on 08/24/2022)     prochlorperazine (COMPAZINE) 10 MG tablet Take 1 tablet (10 mg total) by mouth every 6 (six) hours as needed for nausea or vomiting. (Patient not taking: Reported on 08/24/2022) 30 tablet 0   simvastatin (ZOCOR) 40 MG tablet Take 40 mg by mouth at bedtime.     XARELTO 20 MG TABS tablet TAKE 1 TABLET BY MOUTH  DAILY WITH SUPPER 90 tablet 1   No current facility-administered medications for this visit.    SURGICAL HISTORY:  Past Surgical History:  Procedure Laterality Date   ABDOMINAL AORTA STENT     CARDIAC CATHETERIZATION     CARDIOVERSION N/A 12/04/2015   Procedure: CARDIOVERSION;  Surgeon: Thurmon Fair, MD;  Location: [facility];  Service: Cardiovascular;  Laterality: N/A;   COLONOSCOPY     FINGER SURGERY Right    middle finger- amputation   INSERTION PROSTATE RADIATION SEED     MEDIASTINOSCOPY N/A 01/06/2019   Procedure: MEDIASTINOSCOPY WITH BIOPIES;  Surgeon: Alleen Borne, MD;  Location: [facility];  Service: Thoracic;  Laterality: N/A;   RIGHT/LEFT HEART CATH AND CORONARY ANGIOGRAPHY N/A 10/26/2018   Procedure: RIGHT/LEFT HEART CATH AND CORONARY ANGIOGRAPHY;  Surgeon: Kathleene Hazel, MD;  [facility];  Service: Cardiovascular;  Laterality: N/A;   TEE WITHOUT CARDIOVERSION N/A 12/06/2018   Procedure: TRANSESOPHAGEAL ECHOCARDIOGRAM (TEE);  Surgeon: Kathleene Hazel, MD;  Location: [facility] INVASIVE CV LAB;  Service: Open Heart Surgery;  Laterality: N/A;   TRANSCATHETER AORTIC VALVE REPLACEMENT, TRANSFEMORAL N/A 12/06/2018   Procedure: TRANSCATHETER AORTIC VALVE REPLACEMENT, TRANSFEMORAL;  Surgeon: Kathleene Hazel,  MD;  [facility];  Service: Open Heart Surgery;  Laterality: N/A;   UPPER GASTROINTESTINAL ENDOSCOPY     VIDEO ASSISTED THORACOSCOPY (VATS)/THOROCOTOMY Left 02/02/2019   Procedure: VIDEO ASSISTED THORACOSCOPY (VATS)/THOROCOTOMY;  Surgeon: Alleen Borne, MD;  [facility];  Service: Thoracic;  Laterality: Left;   VIDEO BRONCHOSCOPY N/A 01/06/2019   Procedure: VIDEO BRONCHOSCOPY;  Surgeon: Alleen Borne, MD;  Location[facility];  Service: Thoracic;  Laterality: N/A;   VIDEO BRONCHOSCOPY WITH ENDOBRONCHIAL ULTRASOUND N/A 03/07/2021   Procedure: VIDEO BRONCHOSCOPY WITH ENDOBRONCHIAL ULTRASOUND;  Surgeon: Loreli Slot, MD;  Location: [facility];  Service: Thoracic;  Laterality: N/A;   WEDGE RESECTION Left 02/02/2019   Procedure: LUNG RESECTION;  Surgeon: Alleen Borne, MD;  Location: [facility];  Service: Thoracic;  Laterality: Left;  REVIEW OF SYSTEMS:  A comprehensive review of systems was negative except for: Constitutional: positive for fatigue Gastrointestinal: positive for diarrhea   PHYSICAL EXAMINATION: General appearance: alert, cooperative, fatigued, and no distress Head: Normocephalic, without obvious abnormality, atraumatic Neck: no adenopathy, no JVD, supple, symmetrical, trachea midline, and thyroid not enlarged, symmetric, no tenderness/mass/nodules Lymph nodes: Cervical, supraclavicular, and axillary nodes normal. Resp: clear to auscultation bilaterally Back: symmetric, no curvature. ROM normal. No CVA tenderness. Cardio: irregularly irregular rhythm GI: soft, non-tender; bowel sounds normal; no masses,  no organomegaly Extremities: extremities normal, atraumatic, no cyanosis or edema  ECOG PERFORMANCE STATUS: 1 - Symptomatic but completely ambulatory  Blood pressure 131/77, pulse (!) 51, temperature (!) 97.2 F (36.2 C), temperature source Temporal, resp. rate 17, height  (1.753 m), weight 155 lb 3.2 oz (70.4 kg), SpO2 96 %.  LABORATORY DATA: Lab  Results  Component Value Date   WBC 7.4 09/24/2022   HGB 13.7 09/24/2022   HCT 42.0 09/24/2022   MCV 90.3 09/24/2022   PLT 147 (L) 09/24/2022      Chemistry      Component Value Date/Time   NA 137 08/24/2022 1512   NA 138 12/06/2019 1438   K 4.1 08/24/2022 1512   CL 103 08/24/2022 1512   CO2 26 08/24/2022 1512   BUN 23 08/24/2022 1512   BUN 14 12/06/2019 1438   CREATININE 1.02 08/24/2022 1512   CREATININE 0.81 11/29/2015 1335      Component Value Date/Time   CALCIUM 10.1 08/24/2022 1512   ALKPHOS 101 08/24/2022 1512   AST 27 08/24/2022 1512   ALT 21 08/24/2022 1512   BILITOT 0.9 08/24/2022 1512       RADIOGRAPHIC STUDIES: No results found.  ASSESSMENT AND PLAN:  This is a very pleasant 82 years old white male recently diagnosed with recurrent/metastatic non-small cell lung cancer, adenocarcinoma that was initially diagnosed as stage Ib (T2 a, N0, M0) adenocarcinoma in September 2020 status post wedge resection of the left lower lobe lung nodule and he has recurrence in September 2022 presented with metastatic mediastinal lymphadenopathy in addition to left hilar and left pleural metastatic disease. The patient had molecular studies by foundation 1 but there was insufficient material for the molecular test.  His PD-L1 expression was negative.  I will ask the pathology department to send his original resected tumor in 2020 for molecular studies as it may have enough tissue for testing. The patient started systemic chemotherapy with carboplatin for AUC of 5, Alimta 500 Mg/M2 and Keytruda 200 Mg IV every 3 weeks on April 30, 2021.  Status post 2 cycles.  He had evidence for disease progression after the first 2 cycles of his treatment. He had repeat CT scan of the chest that showed progressive metastatic disease throughout the pleura of the left hemothorax with new right hilar lymphadenopathy and increasing prevascular lymphadenopathy.  His treatment with chemotherapy was  discontinued. Molecular studies from his resected tumor in September 2020 showed positive EGFR mutation, exon 42. The patient started treatment with Tarceva 150 mg p.o. daily on June 28, 2021.  He was not a good candidate for treatment with Tagrisso because of prolonged QT interval and concern about arrhythmia with Tagrisso. He has been on treatment with Tarceva 100 mg p.o. daily status post 12 months. He has been tolerating his treatment well with no concerning adverse effect except for mild fatigue and few episodes of diarrhea. I recommended for him to continue his current treatment with Tarceva with  the same dose. I will see him back for follow-up visit in 1 months for evaluation with repeat CT scan of the chest, abdomen and pelvis for restaging of his disease. The patient was advised to call immediately if he has any other concerning symptoms in the interval. The patient voices understanding of current disease status and treatment options and is in agreement with the current care plan.  All questions were answered. The patient knows to call the clinic with any problems, questions or concerns. We can certainly see the patient much sooner if necessary.  Disclaimer: This note was dictated with voice recognition software. Similar sounding words can inadvertently be transcribed and may not be corrected upon review.

## 2022-09-28 ENCOUNTER — Telehealth: Payer: Self-pay | Admitting: Physician Assistant

## 2022-09-28 NOTE — Telephone Encounter (Signed)
patients daughter called to verify time and dates of appointments.

## 2022-09-28 NOTE — Telephone Encounter (Signed)
patients daughter called to move follow up appointment later in the day.

## 2022-09-29 ENCOUNTER — Other Ambulatory Visit (HOSPITAL_COMMUNITY): Payer: Self-pay

## 2022-10-21 NOTE — Progress Notes (Deleted)
[facility] OFFICE PROGRESS NOTE  [name], MD [address] [place] [address]  DIAGNOSIS: Recurrent/metastatic non-small cell lung cancer, adenocarcinoma initially diagnosed as a stage Ib (T2 a, N0, M0) adenocarcinoma in September 2020 status post wedge resection of the left upper lobe nodule.  The patient has disease recurrence and September 2022 with metastatic mediastinal lymphadenopathy as well as left hilar and left pleural metastatic disease    PD-L1 expression was negative.   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  First dose April 30, 2021. Status post 2 cycles. Discontinued due to molecular studies positive for EGFR mutation  CURRENT THERAPY: Tarceva 150 mg p.o. daily started June 28, 2021.  Status post 2 month of treatment.  His dose was changed to Tarceva 100 mg p.o. daily on 09/04/2021 secondary to intolerance and frequent skin rash.  Status post 13 months of treatment.   INTERVAL HISTORY: Vincent Velez 82 y.o. male returns  to the clinic today for a follow-up visit accompanied by his daughter.  The patient is feeling fairly well today without any concerning complaints except for fatigue and lack of stamina. The patient is currently on targeted treatment with dose reduced Tarceva secondary to intolerance with skin rash and diarrhea.  The patient has been tolerating the reduced dose better with mild fatigue, manageable skin rash, and occasional diarrhea.  He uses hydrocortisone cream for the rash. The patient denies any fever, chills, night sweats, or unexplained weight loss.  He does mention he coughs up  clear phlegm and has some dyspnea with certain activities. He thinks this may have gotten a little worse.  He denies any chest pain or hemoptysis.  Denies any nausea, vomiting, or constipation. As previously mentioned, he does get some diarrhea due to Tarceva but he is taking an iron supplement which causes constipation, therefore, his bowel habits have balanced out recently. Had diarrhea periodically but gotten better taking iron.  Denies any headache or visual changes.  The patient recently had a restaging CT scan performed.  The patient is here today for evaluation and to review his scan results    MEDICAL HISTORY: Past Medical History:  Diagnosis Date   AAA (abdominal aortic aneurysm) (HCC)    Anxiety    Arthritis    Asthma    when younger   Atrial fibrillation (HCC)    CAD (coronary artery disease)    Cataract    removed both   COPD (chronic obstructive pulmonary disease) (HCC)    Diabetes mellitus    Type II   Dysrhythmia    afib   Esophageal reflux    Family history of malignant neoplasm of gastrointestinal tract    Heart murmur    Hiatal hernia    Hyperlipemia    Hypertension    Lung nodule    a. PET scan highly suspicious for malignancy. Bronch with biopsy to be done after TAVR   Malignant neoplasm of prostate [facility])    prostate    Peripheral vascular disease (HCC)    Pneumonia    Stricture and stenosis of esophagus     ALLERGIES:  is allergic to macrolides and ketolides.  MEDICATIONS:  Current Outpatient Medications  Medication Sig Dispense Refill   ACCU-CHEK GUIDE test strip USE TO CHECK YOUR BLOOD SUGAR ONCE DAILY     acetaminophen (TYLENOL) 500 MG tablet Take 2 tablets (1,000 mg total) by mouth every 6 (six) hours as needed for mild pain or fever. 30 tablet 0   diltiazem (CARDIZEM SR) 60 MG 12 hr capsule Take 1 capsule (60 mg total) by mouth every 12 (twelve) hours. 60 capsule 7   dorzolamide-timolol (COSOPT) 22.3-6.8 MG/ML ophthalmic solution Place 1 drop into  both eyes in the morning, at noon, and at bedtime.     erlotinib (TARCEVA) 100 MG tablet Take 1 tablet (100 mg total) by mouth daily. Take on an empty stomach 1 hour before meals or 2 hours after. 30 tablet 3   glimepiride (AMARYL) 2 MG tablet Take 2 mg by mouth daily with breakfast.     JARDIANCE 25 MG TABS tablet Take 25 mg by mouth daily.     metFORMIN (GLUCOPHAGE-XR) 500 MG 24 hr tablet Take 1,000 mg by mouth every evening.      methylPREDNISolone (MEDROL DOSEPAK) 4 MG TBPK tablet Use as instructed 21 tablet 0   metoprolol succinate (TOPROL-XL) 25 MG 24 hr tablet Take 50 mg by mouth daily.     Multiple Vitamins-Minerals (MULTIVITAMIN WITH MINERALS) tablet Take 1 tablet by mouth daily. (Patient not taking: Reported on 08/24/2022)     Multiple Vitamins-Minerals (ZINC PO) Take 1 tablet by mouth daily. (Patient not taking: Reported on 08/24/2022)     prochlorperazine (COMPAZINE) 10 MG tablet Take 1 tablet (10 mg total) by mouth every 6 (six) hours as needed for nausea or vomiting. (Patient not taking: Reported on 08/24/2022) 30 tablet 0   simvastatin (ZOCOR) 40 MG tablet Take 40 mg by mouth at bedtime.     XARELTO 20 MG TABS tablet TAKE 1 TABLET BY MOUTH  DAILY WITH SUPPER 90 tablet 1   No current facility-administered medications for this visit.    SURGICAL HISTORY:  Past Surgical History:  Procedure Laterality Date   ABDOMINAL AORTA STENT     CARDIAC CATHETERIZATION     CARDIOVERSION N/A 12/04/2015   Procedure: CARDIOVERSION;  Surgeon: Thurmon Fair, MD;  Location: [facility];  Service: Cardiovascular;  Laterality: N/A;   COLONOSCOPY     FINGER SURGERY Right    middle finger- amputation   INSERTION PROSTATE RADIATION SEED     MEDIASTINOSCOPY N/A 01/06/2019   Procedure: MEDIASTINOSCOPY WITH BIOPIES;  Surgeon: Alleen Borne, MD;  Location: [facility] OR;  Service: Thoracic;  Laterality: N/A;   RIGHT/LEFT HEART CATH AND CORONARY ANGIOGRAPHY N/A 10/26/2018   Procedure: RIGHT/LEFT HEART CATH AND  CORONARY ANGIOGRAPHY;  Surgeon: Kathleene Hazel, MD;  Location: [facility] INVASIVE CV LAB;  Service: Cardiovascular;  Laterality: N/A;   TEE WITHOUT CARDIOVERSION N/A 12/06/2018   Procedure: TRANSESOPHAGEAL ECHOCARDIOGRAM (TEE);  Surgeon: Kathleene Hazel, MD;  Location: [facility] INVASIVE CV LAB;  Service: Open Heart Surgery;  Laterality: N/A;   TRANSCATHETER AORTIC VALVE REPLACEMENT, TRANSFEMORAL N/A 12/06/2018   Procedure: TRANSCATHETER AORTIC VALVE REPLACEMENT, TRANSFEMORAL;  Surgeon: Kathleene Hazel, MD;  Location: [facility] INVASIVE CV LAB;  Service: Open Heart Surgery;  Laterality: N/A;   UPPER GASTROINTESTINAL ENDOSCOPY     VIDEO ASSISTED THORACOSCOPY (VATS)/THOROCOTOMY Left 02/02/2019   Procedure: VIDEO ASSISTED THORACOSCOPY (VATS)/THOROCOTOMY;  Surgeon: Alleen Borne, MD;  Location: [facility] OR;  Service: Thoracic;  Laterality: Left;   VIDEO BRONCHOSCOPY N/A 01/06/2019   Procedure: VIDEO BRONCHOSCOPY;  Surgeon: Alleen Borne, MD;  Location: [facility] OR;  Service: Thoracic;  Laterality: N/A;   VIDEO BRONCHOSCOPY WITH ENDOBRONCHIAL ULTRASOUND N/A 03/07/2021   Procedure: VIDEO BRONCHOSCOPY WITH ENDOBRONCHIAL ULTRASOUND;  Surgeon: Loreli Slot, MD;  Location: [facility] OR;  Service: Thoracic;  Laterality: N/A;   WEDGE RESECTION Left 02/02/2019   Procedure: LUNG RESECTION;  Surgeon: Alleen Borne, MD;  Location: [facility] OR;  Service: Thoracic;  Laterality: Left;    REVIEW OF SYSTEMS:   Review of Systems  Constitutional: Negative for appetite change, chills, fatigue, fever and unexpected weight change.  HENT:   Negative for mouth sores, nosebleeds, sore throat and trouble swallowing.   Eyes: Negative for eye problems and icterus.  Respiratory: Negative for cough, hemoptysis, shortness of breath and wheezing.   Cardiovascular: Negative for chest pain and leg swelling.  Gastrointestinal: Negative for abdominal pain, constipation, diarrhea, nausea and vomiting.  Genitourinary: Negative for bladder  incontinence, difficulty urinating, dysuria, frequency and hematuria.   Musculoskeletal: Negative for back pain, gait problem, neck pain and neck stiffness.  Skin: Negative for itching and rash.  Neurological: Negative for dizziness, extremity weakness, gait problem, headaches, light-headedness and seizures.  Hematological: Negative for adenopathy. Does not bruise/bleed easily.  Psychiatric/Behavioral: Negative for confusion, depression and sleep disturbance. The patient is not nervous/anxious.     PHYSICAL EXAMINATION:  There were no vitals taken for this visit.  ECOG PERFORMANCE STATUS: {CHL ONC ECOG [id number]  Physical Exam  Constitutional: Oriented to person, place, and time and well-developed, well-nourished, and in no distress. No distress.  HENT:  Head: Normocephalic and atraumatic.  Mouth/Throat: Oropharynx is clear and moist. No oropharyngeal exudate.  Eyes: Conjunctivae are normal. Right eye exhibits no discharge. Left eye exhibits no discharge. No scleral icterus.  Neck: Normal range of motion. Neck supple.  Cardiovascular: Normal rate, regular rhythm, normal heart sounds and intact distal pulses.   Pulmonary/Chest: Effort normal and breath sounds normal. No respiratory distress. No wheezes. No rales.  Abdominal: Soft. Bowel sounds are normal. Exhibits no distension and no mass. There is no tenderness.  Musculoskeletal: Normal range of motion. Exhibits no edema.  Lymphadenopathy:    No cervical adenopathy.  Neurological: Alert and oriented to person, place, and time. Exhibits normal muscle tone. Gait normal. Coordination normal.  Skin: Skin is warm and dry. No rash noted. Not diaphoretic. No erythema. No pallor.  Psychiatric: Mood, memory and judgment normal.  Vitals reviewed.  LABORATORY DATA: Lab Results  Component Value Date   WBC 7.4 09/24/2022   HGB 13.7 09/24/2022   HCT 42.0 09/24/2022   MCV 90.3 09/24/2022   PLT 147 (L) 09/24/2022      Chemistry       Component Value Date/Time   NA 135 09/24/2022 1436   NA 138 12/06/2019 1438   K 4.2 09/24/2022 1436   CL 103 09/24/2022 1436   CO2 26 09/24/2022 1436   BUN 26 (H) 09/24/2022 1436   BUN 14 12/06/2019 1438   CREATININE 0.98 09/24/2022 1436   CREATININE 0.81 11/29/2015 1335      Component Value Date/Time   CALCIUM 9.8 09/24/2022 1436   ALKPHOS 94 09/24/2022 1436   AST 25 09/24/2022 1436   ALT 18 09/24/2022 1436   BILITOT 1.2 09/24/2022 1436       RADIOGRAPHIC STUDIES:  No results found.   ASSESSMENT/PLAN:  This is a very pleasant 82 year old Caucasian male diagnosed with recurrent/metastatic non-small cell lung cancer, adenocarcinoma that was initially diagnosed as stage  Ib (T2 a, N0, M0) adenocarcinoma in September 2020 status post wedge resection of the left lower lobe lung nodule and he had recurrence in September 2022 presented with metastatic mediastinal lymphadenopathy in addition to left hilar and left pleural metastatic disease.  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    He underwent 2 cycles of systemic palliative chemotherapy with carboplatin AUC of 5 and Alimta 500 mg per metered squared and Keytruda 200 mg IV every 3 weeks.  He received 2 cycles.  This was discontinued due to receiving the results of his molecular studies which show EGFR mutation.  He was started on treatment with Tarceva 150 mg p.o. daily on 06/28/2021.  He was not a good candidate for Tagrisso because of his prolonged QT and concern about arrhythmia while on Tagrisso.   His dose was reduced to 100 mg p.o. daily of Tarceva. His dose was reduced in April 2023. He is tolerating this well except for manageable diarrhea.   The patient recently had a restaging CT scan performed.  Dr. Arbutus Ped personally independently reviewed the scan discussed results with the patient today.  The scan showed ***. Dr. Arbutus Ped recommends that he continue on the same  treatment at the same dose..   We will see him back for follow-up visit in  6 weeks for evaluation repeat blood work. We will them see him 6 weeks after that for evaluation and repeat CT scan.    He will continue to take Imodium if needed for diarrhea and use hydrocortisone for his face.     ***Regarding his breathing, the patient is active at home and vitals normal. No acute or new findings to explain his mild increase in his cough or dyspnea on exertion. 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 The patient does not have a pulmonologist or use any inhalers. Overall he is functional and cuts wood in the yard at home. Dr. Arbutus Ped mentioned we can periodically prescribe medrol Dosepak to see if it helps his breathing. The patient would like to try one now to see if any improvement. I have sent to pharmacy and he will start this tomorrow morning. I also discussed he can take OTC delsym or robitussin if needed for cough. ***  The patient was advised to call immediately if he has any concerning symptoms in the interval. The patient voices understanding of current disease status and treatment options and is in agreement with the current care plan. All questions were answered. The patient knows to call the clinic with any problems, questions or concerns. We can certainly see the patient much sooner if necessary      No orders of the defined types were placed in this encounter.    I spent {CHL ONC TIME VISIT - [id number]} counseling the patient face to face. The total time spent in the appointment was {CHL ONC TIME VISIT - BJYNW:[id number]}.  Briteny Fulghum L Kaien Pezzullo, PA-C 10/21/22

## 2022-10-22 ENCOUNTER — Other Ambulatory Visit (HOSPITAL_COMMUNITY): Payer: Self-pay

## 2022-10-23 ENCOUNTER — Inpatient Hospital Stay: Payer: Medicare Other | Attending: Physician Assistant

## 2022-10-23 ENCOUNTER — Ambulatory Visit (HOSPITAL_COMMUNITY)
Admission: RE | Admit: 2022-10-23 | Discharge: 2022-10-23 | Disposition: A | Discharge status: Home / Self Care | Payer: Medicare Other | Source: Ambulatory Visit | Attending: Internal Medicine | Admitting: Internal Medicine

## 2022-10-23 DIAGNOSIS — C3490 Malignant neoplasm of unspecified part of unspecified bronchus or lung: Secondary | ICD-10-CM

## 2022-10-23 DIAGNOSIS — C3412 Malignant neoplasm of upper lobe, left bronchus or lung: Secondary | ICD-10-CM | POA: Diagnosis present

## 2022-10-23 LAB — CBC WITH DIFFERENTIAL (CANCER CENTER ONLY)
Abs Immature Granulocytes: 0.04 10*3/uL (ref 0.00–0.07)
Basophils Absolute: 0.1 10*3/uL (ref 0.0–0.1)
Basophils Relative: 1 %
Eosinophils Absolute: 0.3 10*3/uL (ref 0.0–0.5)
Eosinophils Relative: 4 %
HCT: 40.7 % (ref 39.0–52.0)
Hemoglobin: 13.9 g/dL (ref 13.0–17.0)
Immature Granulocytes: 1 %
Lymphocytes Relative: 19 %
Lymphs Abs: 1.5 10*3/uL (ref 0.7–4.0)
MCH: 30.5 pg (ref 26.0–34.0)
MCHC: 34.2 g/dL (ref 30.0–36.0)
MCV: 89.3 fL (ref 80.0–100.0)
Monocytes Absolute: 0.7 10*3/uL (ref 0.1–1.0)
Monocytes Relative: 9 %
Neutro Abs: 5.4 10*3/uL (ref 1.7–7.7)
Neutrophils Relative %: 66 %
Platelet Count: 114 10*3/uL — ABNORMAL LOW (ref 150–400)
RBC: 4.56 MIL/uL (ref 4.22–5.81)
RDW: 14.7 % (ref 11.5–15.5)
WBC Count: 8 10*3/uL (ref 4.0–10.5)
nRBC: 0 % (ref 0.0–0.2)

## 2022-10-23 LAB — CMP (CANCER CENTER ONLY)
ALT: 20 U/L (ref 0–44)
AST: 29 U/L (ref 15–41)
Albumin: 4 g/dL (ref 3.5–5.0)
Alkaline Phosphatase: 85 U/L (ref 38–126)
Anion gap: 7 (ref 5–15)
BUN: 26 mg/dL — ABNORMAL HIGH (ref 8–23)
CO2: 26 mmol/L (ref 22–32)
Calcium: 9.8 mg/dL (ref 8.9–10.3)
Chloride: 103 mmol/L (ref 98–111)
Creatinine: 0.86 mg/dL (ref 0.61–1.24)
GFR, Estimated: >60 mL/min (ref >60)
Glucose, Bld: 174 mg/dL — ABNORMAL HIGH (ref 70–99)
Potassium: 3.9 mmol/L (ref 3.5–5.1)
Sodium: 136 mmol/L (ref 135–145)
Total Bilirubin: 1.1 mg/dL (ref 0.3–1.2)
Total Protein: 7.4 g/dL (ref 6.5–8.1)

## 2022-10-23 MED ORDER — IOHEXOL 300 MG/ML  SOLN
100.0000 mL | Freq: Once | INTRAMUSCULAR | Status: AC | PRN
  Administered 2022-10-23: 100 mL via INTRAVENOUS

## 2022-10-27 ENCOUNTER — Ambulatory Visit: Payer: Medicare Other | Admitting: Physician Assistant

## 2022-10-27 ENCOUNTER — Inpatient Hospital Stay: Payer: Medicare Other | Admitting: Physician Assistant

## 2022-10-29 ENCOUNTER — Telehealth: Payer: Self-pay | Admitting: Internal Medicine

## 2022-10-29 NOTE — Telephone Encounter (Signed)
Scheduled per 05/28 scheduling message, patient has been called and notified. 

## 2022-11-02 NOTE — Progress Notes (Unsigned)
[facility] OFFICE PROGRESS NOTE  [name], MD [address] [place] [address]  DIAGNOSIS: Recurrent/metastatic non-small cell lung cancer, adenocarcinoma initially diagnosed as a stage Ib (T2 a, N0, M0) adenocarcinoma in September 2020 status post wedge resection of the left upper lobe nodule.  The patient has disease recurrence and September 2022 with metastatic mediastinal lymphadenopathy as well as left hilar and left pleural metastatic disease    PD-L1 expression was negative.   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  First dose April 30, 2021. Status post 2 cycles. Discontinued due to molecular studies positive for EGFR mutation  CURRENT THERAPY: Tarceva 150 mg p.o. daily started June 28, 2021.  Status post 2 month of treatment.  His dose was changed to Tarceva 100 mg p.o. daily on 09/04/2021 secondary to intolerance and frequent skin rash.  Status post 13 months of treatment.   INTERVAL HISTORY: Vincent Velez 82 y.o. male returns to the clinic today for a follow-up visit accompanied by a family member.  The patient is feeling fairly well today without any concerning complaints except except he reports he has had some epistaxis from his left nostril. He is on a blood thinner. He states some of his episodes have lasted an hour. He reports sometimes these episodes are provoked by blowing his nose, but other times he wakes up with nose bleeds.    The patient is currently on targeted treatment with dose reduced Tarceva secondary to intolerance with skin rash and diarrhea.  He uses hydrocortisone cream for the rash.  He uses imodium for diarrhea which helps, he does feel like he is having to take more imodium than prior though.    The patient denies any fever, chills, or night sweats.  He lost a few pounds since last being seen. He states his breathing is stable. He sometimes has dyspnea on exertion. He sometimes cough with clear phlegm. He denies any chest pain or hemoptysis.  Denies any nausea, vomiting, or constipation. He stopped taking his iron supplement recently because his prescription ran out and he does not know if he still needed this. Denies any headache or visual changes.  The patient recently had a restaging CT scan performed.  The patient is here today for evaluation and to review his scan results  MEDICAL HISTORY: Past Medical History:  Diagnosis Date   AAA (abdominal aortic aneurysm) (HCC)    Anxiety    Arthritis    Asthma    when younger   Atrial fibrillation (HCC)    CAD (coronary artery disease)    Cataract    removed both   COPD (chronic obstructive pulmonary disease) (HCC)    Diabetes mellitus    Type II   Dysrhythmia    afib   Esophageal reflux    Family history of malignant neoplasm of gastrointestinal tract    Heart murmur    Hiatal hernia    Hyperlipemia    Hypertension    Lung nodule    a. PET scan highly suspicious for malignancy. Bronch with biopsy to be done after TAVR   Malignant neoplasm of prostate [facility])    prostate  Peripheral vascular disease (HCC)    Pneumonia    Stricture and stenosis of esophagus     ALLERGIES:  is allergic to macrolides and ketolides.  MEDICATIONS:  Current Outpatient Medications  Medication Sig Dispense Refill   ACCU-CHEK GUIDE test strip USE TO CHECK YOUR BLOOD SUGAR ONCE DAILY     acetaminophen (TYLENOL) 500 MG tablet Take 2 tablets (1,000 mg total) by mouth every 6 (six) hours as needed for mild pain or fever. 30 tablet 0   diltiazem (CARDIZEM SR) 60 MG 12 hr capsule Take 1 capsule (60 mg total) by mouth every 12 (twelve)  hours. 60 capsule 7   dorzolamide-timolol (COSOPT) 22.3-6.8 MG/ML ophthalmic solution Place 1 drop into both eyes in the morning, at noon, and at bedtime.     erlotinib (TARCEVA) 100 MG tablet Take 1 tablet (100 mg total) by mouth daily. Take on an empty stomach 1 hour before meals or 2 hours after. 30 tablet 3   glimepiride (AMARYL) 2 MG tablet Take 2 mg by mouth daily with breakfast.     JARDIANCE 25 MG TABS tablet Take 25 mg by mouth daily.     metFORMIN (GLUCOPHAGE-XR) 500 MG 24 hr tablet Take 1,000 mg by mouth every evening.      methylPREDNISolone (MEDROL DOSEPAK) 4 MG TBPK tablet Use as instructed 21 tablet 0   metoprolol succinate (TOPROL-XL) 25 MG 24 hr tablet Take 50 mg by mouth daily.     Multiple Vitamins-Minerals (MULTIVITAMIN WITH MINERALS) tablet Take 1 tablet by mouth daily. (Patient not taking: Reported on 08/24/2022)     Multiple Vitamins-Minerals (ZINC PO) Take 1 tablet by mouth daily. (Patient not taking: Reported on 08/24/2022)     prochlorperazine (COMPAZINE) 10 MG tablet Take 1 tablet (10 mg total) by mouth every 6 (six) hours as needed for nausea or vomiting. (Patient not taking: Reported on 08/24/2022) 30 tablet 0   simvastatin (ZOCOR) 40 MG tablet Take 40 mg by mouth at bedtime.     XARELTO 20 MG TABS tablet TAKE 1 TABLET BY MOUTH  DAILY WITH SUPPER 90 tablet 1   No current facility-administered medications for this visit.    SURGICAL HISTORY:  Past Surgical History:  Procedure Laterality Date   ABDOMINAL AORTA STENT     CARDIAC CATHETERIZATION     CARDIOVERSION N/A 12/04/2015   Procedure: CARDIOVERSION;  Surgeon: Thurmon Fair, MD;  Location: [facility] ENDOSCOPY;  Service: Cardiovascular;  Laterality: N/A;   COLONOSCOPY     FINGER SURGERY Right    middle finger- amputation   INSERTION PROSTATE RADIATION SEED     MEDIASTINOSCOPY N/A 01/06/2019   Procedure: MEDIASTINOSCOPY WITH BIOPIES;  Surgeon: Alleen Borne, MD;  Location: [facility] OR;  Service: Thoracic;  Laterality: N/A;    RIGHT/LEFT HEART CATH AND CORONARY ANGIOGRAPHY N/A 10/26/2018   Procedure: RIGHT/LEFT HEART CATH AND CORONARY ANGIOGRAPHY;  Surgeon: Kathleene Hazel, MD;  Location: [facility] INVASIVE CV LAB;  Service: Cardiovascular;  Laterality: N/A;   TEE WITHOUT CARDIOVERSION N/A 12/06/2018   Procedure: TRANSESOPHAGEAL ECHOCARDIOGRAM (TEE);  Surgeon: Kathleene Hazel, MD;  Location: [facility] INVASIVE CV LAB;  Service: Open Heart Surgery;  Laterality: N/A;   TRANSCATHETER AORTIC VALVE REPLACEMENT, TRANSFEMORAL N/A 12/06/2018   Procedure: TRANSCATHETER AORTIC VALVE REPLACEMENT, TRANSFEMORAL;  Surgeon: Kathleene Hazel, MD;  Location: [facility] INVASIVE CV LAB;  Service: Open Heart Surgery;  Laterality: N/A;   UPPER GASTROINTESTINAL ENDOSCOPY     VIDEO ASSISTED THORACOSCOPY (VATS)/THOROCOTOMY Left 02/02/2019   Procedure: VIDEO ASSISTED THORACOSCOPY (  VATS)/THOROCOTOMY;  Surgeon: Alleen Borne, MD;  Location: [facility] OR;  Service: Thoracic;  Laterality: Left;   VIDEO BRONCHOSCOPY N/A 01/06/2019   Procedure: VIDEO BRONCHOSCOPY;  Surgeon: Alleen Borne, MD;  Location: [facility] OR;  Service: Thoracic;  Laterality: N/A;   VIDEO BRONCHOSCOPY WITH ENDOBRONCHIAL ULTRASOUND N/A 03/07/2021   Procedure: VIDEO BRONCHOSCOPY WITH ENDOBRONCHIAL ULTRASOUND;  Surgeon: Loreli Slot, MD;  Location: [facility] OR;  Service: Thoracic;  Laterality: N/A;   WEDGE RESECTION Left 02/02/2019   Procedure: LUNG RESECTION;  Surgeon: Alleen Borne, MD;  Location: [facility] OR;  Service: Thoracic;  Laterality: Left;    REVIEW OF SYSTEMS:   Review of Systems  Constitutional: Positive for fatigue and weight loss. Negative for chills, fever and unexpected weight change.  HENT: Positive for epistaxis (left nostril). Negative for mouth sores, sore throat and trouble swallowing.   Eyes: Negative for eye problems and icterus.  Respiratory: Positive for cough and shortness of breath with exertion. Negative for hemoptysis and wheezing.   Cardiovascular: Negative for  chest pain and leg swelling.  Gastrointestinal: Positive for occasional diarrhea. Negative for abdominal pain, constipation, nausea and vomiting.  Genitourinary: Negative for bladder incontinence, difficulty urinating, dysuria, frequency and hematuria.   Musculoskeletal: Negative for back pain, gait problem, neck pain and neck stiffness.  Skin: Positive for dry scattered skin rash on upper extremities and face.  Neurological: Negative for dizziness, extremity weakness, gait problem, headaches, light-headedness and seizures.  Hematological: Negative for adenopathy. Does not bruise/bleed easily.  Psychiatric/Behavioral: Negative for confusion, depression and sleep disturbance. The patient is not nervous/anxious.     PHYSICAL EXAMINATION:  Blood pressure 123/62, pulse (!) 50, temperature (!) 97.4 F (36.3 C), temperature source Oral, resp. rate 17, height 5\' 9"  (1.753 m), weight 149 lb 6.4 oz (67.8 kg), SpO2 98 %.  ECOG PERFORMANCE STATUS: 1  Physical Exam  Constitutional: Oriented to person, place, and time and well-developed, well-nourished, and in no distress.  HENT:  Head: Normocephalic and atraumatic.  Mouth/Throat: Oropharynx is clear and moist. No oropharyngeal exudate.  Eyes: Conjunctivae are normal. Right eye exhibits no discharge. Left eye exhibits no discharge. No scleral icterus.  Neck: Normal range of motion. Neck supple.  Cardiovascular: Bradycardic, regular rhythm, normal heart sounds and intact distal pulses.   Pulmonary/Chest: Effort normal and breath sounds normal. No respiratory distress. No wheezes. No rales.  Abdominal: Soft. Bowel sounds are normal. Exhibits no distension and no mass. There is no tenderness.  Musculoskeletal: Normal range of motion. Exhibits no edema.  Lymphadenopathy:    No cervical adenopathy.  Neurological: Alert and oriented to person, place, and time. Exhibits normal muscle tone. Gait normal. Coordination normal.  Skin: Positive for dry scattered  skin rash on upper extremities and face/neck. Skin is warm and dry. Not diaphoretic. No erythema. No pallor.  Psychiatric: Mood, memory and judgment normal.  Vitals reviewed.  LABORATORY DATA: Lab Results  Component Value Date   WBC 7.3 11/05/2022   HGB 13.8 11/05/2022   HCT 41.8 11/05/2022   MCV 89.7 11/05/2022   PLT 133 (L) 11/05/2022      Chemistry      Component Value Date/Time   NA 136 11/05/2022 1401   NA 138 12/06/2019 1438   K 3.9 11/05/2022 1401   CL 104 11/05/2022 1401   CO2 24 11/05/2022 1401   BUN 20 11/05/2022 1401   BUN 14 12/06/2019 1438   CREATININE 0.98 11/05/2022 1401   CREATININE 0.81 11/29/2015 1335  Component Value Date/Time   CALCIUM 9.7 11/05/2022 1401   ALKPHOS 88 11/05/2022 1401   AST 27 11/05/2022 1401   ALT 18 11/05/2022 1401   BILITOT 0.9 11/05/2022 1401       RADIOGRAPHIC STUDIES:  CT Chest W Contrast  Result Date: 10/27/2022 CLINICAL DATA:  Non-small cell lung cancer restaging, ongoing chemotherapy * Tracking Code: BO * EXAM: CT CHEST, ABDOMEN, AND PELVIS WITH CONTRAST TECHNIQUE: Multidetector CT imaging of the chest, abdomen and pelvis was performed following the standard protocol during bolus administration of intravenous contrast. RADIATION DOSE REDUCTION: This exam was performed according to the departmental dose-optimization program which includes automated exposure control, adjustment of the mA and/or kV according to patient size and/or use of iterative reconstruction technique. CONTRAST:  OMNIPAQUE IOHEXOL 300 MG/ML  SOLN COMPARISON:  07/13/2022 FINDINGS: CT CHEST FINDINGS Cardiovascular: Aortic atherosclerosis. Aortic valve stent endograft repair. Cardiomegaly. Three-vessel coronary artery calcifications. No pericardial effusion. Mediastinum/Nodes: Unchanged prominent mediastinal and hilar lymph nodes, largest pretracheal node measuring 1.9 x 1.0 cm (series 2, image 21). Thyroid gland, trachea, and esophagus demonstrate no  significant findings. Lungs/Pleura: Unchanged moderate pulmonary fibrosis in a pattern with apical to basal gradient, featuring irregular peripheral interstitial opacity, septal thickening traction bronchiectasis and subpleural bronchiolectasis without clear evidence of honeycombing. No pleural effusion or pneumothorax. Musculoskeletal: No chest wall abnormality. No acute osseous findings. Unchanged, subtly sclerotic nondisplaced fracture of the anterior left fourth rib (series 2, image 31). CT ABDOMEN PELVIS FINDINGS Hepatobiliary: No solid liver abnormality is seen. No gallstones, gallbladder wall thickening, or biliary dilatation. Pancreas: Unremarkable. No pancreatic ductal dilatation or surrounding inflammatory changes. Spleen: Normal in size without significant abnormality. Adrenals/Urinary Tract: Unchanged left adrenal nodule measuring 1.7 x 1.7 cm (series 2, image 66). Normal right adrenal. Kidneys are normal, without renal calculi, solid lesion, or hydronephrosis. Bladder is unremarkable. Stomach/Bowel: Stomach is within normal limits. Appendix is not clearly visualized. No evidence of bowel wall thickening, distention, or inflammatory changes. Moderate burden of stool throughout the colon and rectum Vascular/Lymphatic: Aortic atherosclerosis. Unchanged infrarenal abdominal aorta status post aortobiiliac stent endograft repair. Excluded aneurysm sac measures 4.2 x 3.6 cm with orthogonal technique (series 6, image 117, series 2, image 79). No enlarged abdominal or pelvic lymph nodes. Reproductive: Prostate brachytherapy. Other: No abdominal wall hernia or abnormality. No ascites. Musculoskeletal: No acute osseous findings. IMPRESSION: 1. Unchanged prominent mediastinal hilar lymph nodes. No new lymphadenopathy. 2. Unchanged moderate pulmonary fibrosis in a pattern with apical to basal gradient, featuring irregular peripheral interstitial opacity, septal thickening traction bronchiectasis and subpleural  bronchiolectasis without clear evidence of honeycombing. Findings are categorized as probable UIP per consensus guidelines: Diagnosis of Idiopathic Pulmonary Fibrosis: An Official ATS/ERS/JRS/ALAT Clinical Practice Guideline. Am Rosezetta Schlatter Crit Care Med Vol 198, Iss 5, [phone], Jan 30 2017. 3. 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 Attention on follow-up. 4. Unchanged infrarenal abdominal aorta status post aortobiiliac stent endograft repair. Excluded aneurysm sac measures 4.2 x 3.6 cm with orthogonal technique. 5. Prostate brachytherapy. 6. Cardiomegaly and coronary artery disease. Status post aortic valve stent endograft repair. Aortic Atherosclerosis (ICD10-I70.0). Electronically Signed   By: Jearld Lesch M.D.   On: 10/27/2022 12:52   CT Abdomen Pelvis W Contrast  Result Date: 10/27/2022 CLINICAL DATA:  Non-small cell lung cancer restaging, ongoing chemotherapy * Tracking Code: BO * EXAM: CT CHEST, ABDOMEN, AND PELVIS WITH CONTRAST TECHNIQUE: Multidetector CT imaging of the chest, abdomen and pelvis was performed following the  standard protocol during bolus administration of intravenous contrast. RADIATION DOSE REDUCTION: This exam was performed according to the departmental dose-optimization program which includes automated exposure control, adjustment of the mA and/or kV according to patient size and/or use of iterative reconstruction technique. CONTRAST:  OMNIPAQUE IOHEXOL 300 MG/ML  SOLN COMPARISON:  07/13/2022 FINDINGS: CT CHEST FINDINGS Cardiovascular: Aortic atherosclerosis. Aortic valve stent endograft repair. Cardiomegaly. Three-vessel coronary artery calcifications. No pericardial effusion. Mediastinum/Nodes: Unchanged prominent mediastinal and hilar lymph nodes, largest pretracheal node measuring 1.9 x 1.0 cm (series 2, image 21). Thyroid gland, trachea, and esophagus demonstrate no significant findings. Lungs/Pleura: Unchanged moderate pulmonary  fibrosis in a pattern with apical to basal gradient, featuring irregular peripheral interstitial opacity, septal thickening traction bronchiectasis and subpleural bronchiolectasis without clear evidence of honeycombing. No pleural effusion or pneumothorax. Musculoskeletal: No chest wall abnormality. No acute osseous findings. Unchanged, subtly sclerotic nondisplaced fracture of the anterior left fourth rib (series 2, image 31). CT ABDOMEN PELVIS FINDINGS Hepatobiliary: No solid liver abnormality is seen. No gallstones, gallbladder wall thickening, or biliary dilatation. Pancreas: Unremarkable. No pancreatic ductal dilatation or surrounding inflammatory changes. Spleen: Normal in size without significant abnormality. Adrenals/Urinary Tract: Unchanged left adrenal nodule measuring 1.7 x 1.7 cm (series 2, image 66). Normal right adrenal. Kidneys are normal, without renal calculi, solid lesion, or hydronephrosis. Bladder is unremarkable. Stomach/Bowel: Stomach is within normal limits. Appendix is not clearly visualized. No evidence of bowel wall thickening, distention, or inflammatory changes. Moderate burden of stool throughout the colon and rectum Vascular/Lymphatic: Aortic atherosclerosis. Unchanged infrarenal abdominal aorta status post aortobiiliac stent endograft repair. Excluded aneurysm sac measures 4.2 x 3.6 cm with orthogonal technique (series 6, image 117, series 2, image 79). No enlarged abdominal or pelvic lymph nodes. Reproductive: Prostate brachytherapy. Other: No abdominal wall hernia or abnormality. No ascites. Musculoskeletal: No acute osseous findings. IMPRESSION: 1. Unchanged prominent mediastinal hilar lymph nodes. No new lymphadenopathy. 2. Unchanged moderate pulmonary fibrosis in a pattern with apical to basal gradient, featuring irregular peripheral interstitial opacity, septal thickening traction bronchiectasis and subpleural bronchiolectasis without clear evidence of honeycombing. Findings are  categorized as probable UIP per consensus guidelines: Diagnosis of Idiopathic Pulmonary Fibrosis: An Official ATS/ERS/JRS/ALAT Clinical Practice Guideline. Am Rosezetta Schlatter Crit Care Med Vol 198, Iss 5, [phone], Jan 30 2017. 3. 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 Attention on follow-up. 4. Unchanged infrarenal abdominal aorta status post aortobiiliac stent endograft repair. Excluded aneurysm sac measures 4.2 x 3.6 cm with orthogonal technique. 5. Prostate brachytherapy. 6. Cardiomegaly and coronary artery disease. Status post aortic valve stent endograft repair. Aortic Atherosclerosis (ICD10-I70.0). Electronically Signed   By: Jearld Lesch M.D.   On: 10/27/2022 12:52     ASSESSMENT/PLAN:  This is a very pleasant 82 year old Caucasian male diagnosed with recurrent/metastatic non-small cell lung cancer, adenocarcinoma that was initially diagnosed as stage Ib (T2 a, N0, M0) adenocarcinoma in September 2020 status post wedge resection of the left lower lobe lung nodule and he had recurrence in September 2022 presented with metastatic mediastinal lymphadenopathy in addition to left hilar and left pleural metastatic disease.  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    He underwent 2 cycles of systemic palliative chemotherapy with carboplatin AUC of 5 and Alimta 500 mg per metered squared and Keytruda 200 mg IV every 3 weeks.  He received 2 cycles.  This was discontinued due to receiving the results  of his molecular studies which show EGFR mutation.  He was started on treatment with Tarceva 150 mg p.o. daily on 06/28/2021.  He was not a good candidate for Tagrisso because of his prolonged QT and concern about arrhythmia while on Tagrisso.   His dose was reduced to 100 mg p.o. daily of Tarceva. His dose was reduced in April 2023. He is tolerating this well except for manageable diarrhea.   The patient recently had  a restaging CT scan performed.  Dr. Arbutus Ped personally independently reviewed the scan discussed results with the patient today.  The scan showed no evidence of disease  progression. Dr. Arbutus Ped recommends that he continue on the same treatment at the same dose..   We will see him back for follow-up visit in 2 months weeks for evaluation and repeat blood work. We will them see him 2 months after that for evaluation and repeat CT scan.    He will continue to take Imodium if needed for diarrhea and use hydrocortisone for his face.    I will refer him to ENT for his epistaxis. Of note, he is on eliquis. I encouraged him to avoid forceful blowing of his nose. I also recommended he use saline spray to keep his nasal mucosa moist  The patient is on metoprolol and coreg. His pulse tends to be low at his appointments. He is asymptomatic. Encouraged him to talk to his PCP if he needs his doses reduced.   The patient was advised to call immediately if he has any concerning symptoms in the interval. The patient voices understanding of current disease status and treatment options and is in agreement with the current care plan. All questions were answered. The patient knows to call the clinic with any problems, questions or concerns. We can certainly see the patient much sooner if necessary    Orders Placed This Encounter  Procedures   Iron and Iron Binding Capacity ([facility] only)    Standing Status:   Future    Standing Expiration Date:   11/05/2023   Ferritin    Standing Status:   Future    Standing Expiration Date:   11/05/2023   CBC with Differential (Cancer Center Only)    Standing Status:   Future    Standing Expiration Date:   11/05/2023   CMP (Cancer Center only)    Standing Status:   Future    Standing Expiration Date:   11/05/2023   Ambulatory referral to ENT    Referral Priority:   Routine    Referral Type:   Consultation    Referral Reason:   Specialty Services Required    Requested  Specialty:   Otolaryngology    Number of Visits Requested:   1     Harlo Jaso L Kavita Bartl, PA-C 11/05/22  ADDENDUM: Hematology/Oncology Attending: I had a face-to-face encounter with the patient today.  I reviewed his record, lab, scan and recommended his care plan.  This is a very pleasant 82 years old white male with recurrent/metastatic non-small cell lung cancer, adenocarcinoma with positive EGFR mutation with exon 19 deletion that was initially diagnosed as a stage Ib in September 2020 with disease recurrence in 2022.  He is currently undergoing treatment with Tarceva 100 mg p.o. daily since April 2023.  He has been tolerating this treatment well with no concerning adverse effect except for occasional mild rash and diarrhea. The patient had repeat CT scan of the chest, abdomen and pelvis performed recently.  I personally and independently reviewed  the scan and discussed the result with the patient and his grandson. His scan showed no concerning findings for disease progression. I recommended for him to continue his current treatment with Tarceva with the same dose. I will see him back for follow-up visit in 2 months for evaluation and repeat blood work. The patient was advised to call immediately if he has any other concerning symptoms in the interval. The total time spent in the appointment was 30 minutes. Disclaimer: This note was dictated with voice recognition software. Similar sounding words can inadvertently be transcribed and may be missed upon review. Lajuana Matte, MD

## 2022-11-05 ENCOUNTER — Other Ambulatory Visit: Payer: Self-pay

## 2022-11-05 ENCOUNTER — Inpatient Hospital Stay: Payer: Medicare Other | Attending: Physician Assistant

## 2022-11-05 ENCOUNTER — Other Ambulatory Visit: Payer: Self-pay | Admitting: Physician Assistant

## 2022-11-05 ENCOUNTER — Telehealth: Payer: Self-pay | Admitting: Medical Oncology

## 2022-11-05 ENCOUNTER — Inpatient Hospital Stay: Payer: Medicare Other | Admitting: Physician Assistant

## 2022-11-05 VITALS — BP 123/62 | HR 50 | Temp 97.4°F | Resp 17 | Ht 69.0 in | Wt 149.4 lb

## 2022-11-05 DIAGNOSIS — J479 Bronchiectasis, uncomplicated: Secondary | ICD-10-CM | POA: Insufficient documentation

## 2022-11-05 DIAGNOSIS — Z923 Personal history of irradiation: Secondary | ICD-10-CM | POA: Diagnosis not present

## 2022-11-05 DIAGNOSIS — Z7901 Long term (current) use of anticoagulants: Secondary | ICD-10-CM | POA: Diagnosis not present

## 2022-11-05 DIAGNOSIS — R5383 Other fatigue: Secondary | ICD-10-CM | POA: Insufficient documentation

## 2022-11-05 DIAGNOSIS — I700 Atherosclerosis of aorta: Secondary | ICD-10-CM | POA: Diagnosis not present

## 2022-11-05 DIAGNOSIS — Z8546 Personal history of malignant neoplasm of prostate: Secondary | ICD-10-CM | POA: Insufficient documentation

## 2022-11-05 DIAGNOSIS — I2510 Atherosclerotic heart disease of native coronary artery without angina pectoris: Secondary | ICD-10-CM | POA: Diagnosis not present

## 2022-11-05 DIAGNOSIS — C782 Secondary malignant neoplasm of pleura: Secondary | ICD-10-CM | POA: Diagnosis not present

## 2022-11-05 DIAGNOSIS — R21 Rash and other nonspecific skin eruption: Secondary | ICD-10-CM | POA: Diagnosis not present

## 2022-11-05 DIAGNOSIS — Z79899 Other long term (current) drug therapy: Secondary | ICD-10-CM | POA: Insufficient documentation

## 2022-11-05 DIAGNOSIS — C3412 Malignant neoplasm of upper lobe, left bronchus or lung: Secondary | ICD-10-CM | POA: Insufficient documentation

## 2022-11-05 DIAGNOSIS — R0609 Other forms of dyspnea: Secondary | ICD-10-CM | POA: Diagnosis not present

## 2022-11-05 DIAGNOSIS — C3492 Malignant neoplasm of unspecified part of left bronchus or lung: Secondary | ICD-10-CM

## 2022-11-05 DIAGNOSIS — J8410 Pulmonary fibrosis, unspecified: Secondary | ICD-10-CM | POA: Insufficient documentation

## 2022-11-05 DIAGNOSIS — R634 Abnormal weight loss: Secondary | ICD-10-CM | POA: Diagnosis not present

## 2022-11-05 DIAGNOSIS — R197 Diarrhea, unspecified: Secondary | ICD-10-CM | POA: Diagnosis not present

## 2022-11-05 DIAGNOSIS — Z800 Family history of malignant neoplasm of digestive organs: Secondary | ICD-10-CM | POA: Insufficient documentation

## 2022-11-05 DIAGNOSIS — R0602 Shortness of breath: Secondary | ICD-10-CM | POA: Insufficient documentation

## 2022-11-05 DIAGNOSIS — C3490 Malignant neoplasm of unspecified part of unspecified bronchus or lung: Secondary | ICD-10-CM

## 2022-11-05 DIAGNOSIS — R040 Epistaxis: Secondary | ICD-10-CM | POA: Diagnosis not present

## 2022-11-05 LAB — CBC WITH DIFFERENTIAL (CANCER CENTER ONLY)
Abs Immature Granulocytes: 0.04 10*3/uL (ref 0.00–0.07)
Basophils Absolute: 0 10*3/uL (ref 0.0–0.1)
Basophils Relative: 1 %
Eosinophils Absolute: 0.4 10*3/uL (ref 0.0–0.5)
Eosinophils Relative: 6 %
HCT: 41.8 % (ref 39.0–52.0)
Hemoglobin: 13.8 g/dL (ref 13.0–17.0)
Immature Granulocytes: 1 %
Lymphocytes Relative: 20 %
Lymphs Abs: 1.4 10*3/uL (ref 0.7–4.0)
MCH: 29.6 pg (ref 26.0–34.0)
MCHC: 33 g/dL (ref 30.0–36.0)
MCV: 89.7 fL (ref 80.0–100.0)
Monocytes Absolute: 0.8 10*3/uL (ref 0.1–1.0)
Monocytes Relative: 10 %
Neutro Abs: 4.6 10*3/uL (ref 1.7–7.7)
Neutrophils Relative %: 62 %
Platelet Count: 133 10*3/uL — ABNORMAL LOW (ref 150–400)
RBC: 4.66 MIL/uL (ref 4.22–5.81)
RDW: 14.8 % (ref 11.5–15.5)
WBC Count: 7.3 10*3/uL (ref 4.0–10.5)
nRBC: 0 % (ref 0.0–0.2)

## 2022-11-05 LAB — CMP (CANCER CENTER ONLY)
ALT: 18 U/L (ref 0–44)
AST: 27 U/L (ref 15–41)
Albumin: 4 g/dL (ref 3.5–5.0)
Alkaline Phosphatase: 88 U/L (ref 38–126)
Anion gap: 8 (ref 5–15)
BUN: 20 mg/dL (ref 8–23)
CO2: 24 mmol/L (ref 22–32)
Calcium: 9.7 mg/dL (ref 8.9–10.3)
Chloride: 104 mmol/L (ref 98–111)
Creatinine: 0.98 mg/dL (ref 0.61–1.24)
GFR, Estimated: >60 mL/min (ref >60)
Glucose, Bld: 160 mg/dL — ABNORMAL HIGH (ref 70–99)
Potassium: 3.9 mmol/L (ref 3.5–5.1)
Sodium: 136 mmol/L (ref 135–145)
Total Bilirubin: 0.9 mg/dL (ref 0.3–1.2)
Total Protein: 7.6 g/dL (ref 6.5–8.1)

## 2022-11-05 NOTE — Telephone Encounter (Signed)
LVM to return my call about referring Mr. Vincent Velez for prolonged nosebleeds lasting an hour.

## 2022-11-17 ENCOUNTER — Other Ambulatory Visit (HOSPITAL_COMMUNITY): Payer: Self-pay

## 2022-11-19 ENCOUNTER — Other Ambulatory Visit (HOSPITAL_COMMUNITY): Payer: Self-pay

## 2023-01-08 NOTE — Progress Notes (Unsigned)
[facility] OFFICE PROGRESS NOTE  Vincent Dills, MD [address] [place] [address]  DIAGNOSIS: Recurrent/metastatic non-small cell lung cancer, adenocarcinoma initially diagnosed as a stage Ib (T2 a, N0, M0) adenocarcinoma in September 2020 status post wedge resection of the left upper lobe nodule.  The patient has disease recurrence and September 2022 with metastatic mediastinal lymphadenopathy as well as left hilar and left pleural metastatic disease    PD-L1 expression was negative.   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  First dose April 30, 2021. Status post 2 cycles. Discontinued due to molecular studies positive for EGFR mutation  CURRENT THERAPY: Tarceva 150 mg p.o. daily started June 28, 2021.  Status post 2 month of treatment.  His dose was changed to Tarceva 100 mg p.o. daily on 09/04/2021 secondary to intolerance and frequent skin rash.  Status post 18.5  months of treatment   INTERVAL HISTORY: Vincent JAROSZ 82 y.o. male returns to the clinic today for a follow-up visit accompanied by a family member.  The patient is feeling fair. He does have several concerns today.   At his last appointment, the patient was endorsing epistaxis.  I referred him to ENT.  It sounds like the patient was not able to establish with ENT.  Of note the patient is on a blood thinner for A-fib.  He also reports some weight loss.  It does look like the patient has had some significant weight loss over the last couple appointments.  He reports taste alterations.  He does not have any evidence of thrush on exam.  He is already  drinking 3 supplemental protein drinks per day.   He is also reporting some more dyspnea with activity.  He reports that his breathing improves with rest but he does feel like he is getting winded easier than prior.  He sometimes has a intermittent cough which produces clear phlegm.  Denies any Hemoptysis.  Denies any chest pain except he had 1 episode of chest discomfort in the interval since last being seen with walking to the mailbox which resolved with rest.  It looks like he saw cardiology in 2022 but is not following with them regularly.  He also does not have a pulmonologist.  Of note the patient's staging CT scans often note moderate pulmonary fibrosis.   The patient also reports intermittent dizziness.  Of note the patient is typically bradycardic at his appointments.  He is on Coreg and metoprolol.  He does feel like he is drinking plenty of fluids.  He denies any fever, chills, or night sweats.  He intermittently may have mild nausea.  The patient was not aware that he had an antiemetic which looks like it was last prescribed in 2022.  With his receiver, the patient does have intermittent diarrhea for which he uses Imodium.  He does report the Imodium does typically help resolve his diarrhea.  He is currently struggling with constipation at this moment.  He intermittently also has rashes from Tarceva for which he uses hydrocortisone cream.   He stopped taking his iron supplement recently because his prescription ran out and he does not know if  he still needed this.  This lab was repeated today and is still pending.  He is here today for evaluation and repeat blood work.   MEDICAL HISTORY: Past Medical History:  Diagnosis Date   AAA (abdominal aortic aneurysm) (HCC)    Anxiety    Arthritis    Asthma    when younger   Atrial fibrillation (HCC)    CAD (coronary artery disease)    Cataract    removed both   COPD (chronic obstructive pulmonary disease) (HCC)    Diabetes mellitus     Type II   Dysrhythmia    afib   Esophageal reflux    Family history of malignant neoplasm of gastrointestinal tract    Heart murmur    Hiatal hernia    Hyperlipemia    Hypertension    Lung nodule    a. PET scan highly suspicious for malignancy. Bronch with biopsy to be done after TAVR   Malignant neoplasm of prostate [facility])    prostate    Peripheral vascular disease (HCC)    Pneumonia    Stricture and stenosis of esophagus     ALLERGIES:  is allergic to macrolides and ketolides.  MEDICATIONS:  Current Outpatient Medications  Medication Sig Dispense Refill   ACCU-CHEK GUIDE test strip USE TO CHECK YOUR BLOOD SUGAR ONCE DAILY     acetaminophen (TYLENOL) 500 MG tablet Take 2 tablets (1,000 mg total) by mouth every 6 (six) hours as needed for mild pain or fever. 30 tablet 0   diltiazem (CARDIZEM SR) 60 MG 12 hr capsule Take 1 capsule (60 mg total) by mouth every 12 (twelve) hours. 60 capsule 7   dorzolamide-timolol (COSOPT) 22.3-6.8 MG/ML ophthalmic solution Place 1 drop into both eyes in the morning, at noon, and at bedtime.     erlotinib (TARCEVA) 100 MG tablet Take 1 tablet (100 mg total) by mouth daily. Take on an empty stomach 1 hour before meals or 2 hours after. 30 tablet 3   glimepiride (AMARYL) 2 MG tablet Take 2 mg by mouth daily with breakfast.     JARDIANCE 25 MG TABS tablet Take 25 mg by mouth daily.     metFORMIN (GLUCOPHAGE-XR) 500 MG 24 hr tablet Take 1,000 mg by mouth every evening.      methylPREDNISolone (MEDROL DOSEPAK) 4 MG TBPK tablet Use as instructed 21 tablet 0   metoprolol succinate (TOPROL-XL) 25 MG 24 hr tablet Take 50 mg by mouth daily.     Multiple Vitamins-Minerals (MULTIVITAMIN WITH MINERALS) tablet Take 1 tablet by mouth daily. (Patient not taking: Reported on 08/24/2022)     Multiple Vitamins-Minerals (ZINC PO) Take 1 tablet by mouth daily. (Patient not taking: Reported on 08/24/2022)     prochlorperazine (COMPAZINE) 10 MG tablet Take 1 tablet (10 mg  total) by mouth every 6 (six) hours as needed for nausea or vomiting. (Patient not taking: Reported on 08/24/2022) 30 tablet 0   simvastatin (ZOCOR) 40 MG tablet Take 40 mg by mouth at bedtime.     XARELTO 20 MG TABS tablet TAKE 1 TABLET BY MOUTH  DAILY WITH SUPPER 90 tablet 1   No current facility-administered medications for this visit.    SURGICAL HISTORY:  Past Surgical History:  Procedure Laterality Date   ABDOMINAL AORTA STENT     CARDIAC CATHETERIZATION     CARDIOVERSION N/A 12/04/2015   Procedure: CARDIOVERSION;  Surgeon: Thurmon Fair, MD;  Location: [facility] ENDOSCOPY;  Service: Cardiovascular;  Laterality: N/A;   COLONOSCOPY  FINGER SURGERY Right    middle finger- amputation   INSERTION PROSTATE RADIATION SEED     MEDIASTINOSCOPY N/A 01/06/2019   Procedure: MEDIASTINOSCOPY WITH BIOPIES;  Surgeon: Alleen Borne, MD;  Location: [facility] OR;  Service: Thoracic;  Laterality: N/A;   RIGHT/LEFT HEART CATH AND CORONARY ANGIOGRAPHY N/A 10/26/2018   Procedure: RIGHT/LEFT HEART CATH AND CORONARY ANGIOGRAPHY;  Surgeon: Kathleene Hazel, MD;  Location: [facility] INVASIVE CV LAB;  Service: Cardiovascular;  Laterality: N/A;   TEE WITHOUT CARDIOVERSION N/A 12/06/2018   Procedure: TRANSESOPHAGEAL ECHOCARDIOGRAM (TEE);  Surgeon: Kathleene Hazel, MD;  Location: [facility] INVASIVE CV LAB;  Service: Open Heart Surgery;  Laterality: N/A;   TRANSCATHETER AORTIC VALVE REPLACEMENT, TRANSFEMORAL N/A 12/06/2018   Procedure: TRANSCATHETER AORTIC VALVE REPLACEMENT, TRANSFEMORAL;  Surgeon: Kathleene Hazel, MD;  Location: [facility] INVASIVE CV LAB;  Service: Open Heart Surgery;  Laterality: N/A;   UPPER GASTROINTESTINAL ENDOSCOPY     VIDEO ASSISTED THORACOSCOPY (VATS)/THOROCOTOMY Left 02/02/2019   Procedure: VIDEO ASSISTED THORACOSCOPY (VATS)/THOROCOTOMY;  Surgeon: Alleen Borne, MD;  Location: [facility] OR;  Service: Thoracic;  Laterality: Left;   VIDEO BRONCHOSCOPY N/A 01/06/2019   Procedure: VIDEO BRONCHOSCOPY;  Surgeon:  Alleen Borne, MD;  Location: [facility] OR;  Service: Thoracic;  Laterality: N/A;   VIDEO BRONCHOSCOPY WITH ENDOBRONCHIAL ULTRASOUND N/A 03/07/2021   Procedure: VIDEO BRONCHOSCOPY WITH ENDOBRONCHIAL ULTRASOUND;  Surgeon: Loreli Slot, MD;  Location: [facility] OR;  Service: Thoracic;  Laterality: N/A;   WEDGE RESECTION Left 02/02/2019   Procedure: LUNG RESECTION;  Surgeon: Alleen Borne, MD;  Location: [facility] OR;  Service: Thoracic;  Laterality: Left;    REVIEW OF SYSTEMS:   Review of Systems  Constitutional: Positive for fatigue, decreased appetite, weight loss.  Negative for  chills and fever.  HENT: Positive for epistaxis. Negative for mouth sores, sore throat and trouble swallowing.   Eyes: Negative for eye problems and icterus.  Respiratory: Positive for dyspnea on exertion and cough (progressively worsening). Negative for hemoptysis and wheezing.   Cardiovascular: Negative for chest pain (none at this time) and leg swelling.  Gastrointestinal: Positive for intermittent diarrhea (currently has constipation). Positive for mild intermittent nausea. Negative for vomiting.  Genitourinary: Negative for bladder incontinence, difficulty urinating, dysuria, frequency and hematuria.   Musculoskeletal: Negative for back pain, gait problem, neck pain and neck stiffness.  Skin: Positive for itching and rash.  Neurological: positive for intermittent dizziness/lightheadedness. Negative for  extremity weakness, gait problem, headaches, and seizures.  Hematological: Negative for adenopathy. Does not bruise/bleed easily.  Psychiatric/Behavioral: Negative for confusion, depression and sleep disturbance. The patient is not nervous/anxious.     PHYSICAL EXAMINATION:  There were no vitals taken for this visit.  ECOG PERFORMANCE STATUS: 1  Physical Exam  Constitutional: Oriented to person, place, and time and well-developed, well-nourished, and in no distress.  HENT:  Head: Normocephalic and atraumatic.   Mouth/Throat: Oropharynx is clear and moist. No oropharyngeal exudate.  Eyes: Conjunctivae are normal. Right eye exhibits no discharge. Left eye exhibits no discharge. No scleral icterus.  Neck: Normal range of motion. Neck supple.  Cardiovascular: Normal rate, regular rhythm, normal heart sounds and intact distal pulses.   Pulmonary/Chest: Effort normal and breath sounds normal. No respiratory distress. No wheezes. No rales.  Abdominal: Soft. Bowel sounds are normal. Exhibits no distension and no mass. There is no tenderness.  Musculoskeletal: Normal range of motion. Exhibits no edema.  Lymphadenopathy:    No cervical adenopathy.  Neurological: Alert and oriented to person, place, and  time. Exhibits normal muscle tone. Gait normal. Coordination normal.  Skin: Skin is warm and dry. No rash noted. Not diaphoretic. No erythema. No pallor.  Psychiatric: Mood, memory and judgment normal.  Vitals reviewed.  LABORATORY DATA: Lab Results  Component Value Date   WBC 7.3 11/05/2022   HGB 13.8 11/05/2022   HCT 41.8 11/05/2022   MCV 89.7 11/05/2022   PLT 133 (L) 11/05/2022      Chemistry      Component Value Date/Time   NA 136 11/05/2022 1401   NA 138 12/06/2019 1438   K 3.9 11/05/2022 1401   CL 104 11/05/2022 1401   CO2 24 11/05/2022 1401   BUN 20 11/05/2022 1401   BUN 14 12/06/2019 1438   CREATININE 0.98 11/05/2022 1401   CREATININE 0.81 11/29/2015 1335      Component Value Date/Time   CALCIUM 9.7 11/05/2022 1401   ALKPHOS 88 11/05/2022 1401   AST 27 11/05/2022 1401   ALT 18 11/05/2022 1401   BILITOT 0.9 11/05/2022 1401       RADIOGRAPHIC STUDIES:  No results found.   ASSESSMENT/PLAN:  This is a very pleasant 82 year old Caucasian male diagnosed with recurrent/metastatic non-small cell lung cancer, adenocarcinoma that was initially diagnosed as stage Ib (T2 a, N0, M0) adenocarcinoma in September 2020 status post wedge resection of the left lower lobe lung nodule and he  had recurrence in September 2022 presented with metastatic mediastinal lymphadenopathy in addition to left hilar and left pleural metastatic disease.  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    He underwent 2 cycles of systemic palliative chemotherapy with carboplatin AUC of 5 and Alimta 500 mg per metered squared and Keytruda 200 mg IV every 3 weeks.  He received 2 cycles.  This was discontinued due to receiving the results of his molecular studies which show EGFR mutation.  He was started on treatment with Tarceva 150 mg p.o. daily on 06/28/2021.  He was not a good candidate for Tagrisso because of his prolonged QT and concern about arrhythmia while on Tagrisso.   His dose was reduced to 100 mg p.o. daily of Tarceva. His dose was reduced in April 2023. He is tolerating this well except for manageable diarrhea.   Labs were reviewed.  He discussed the patient's concerns today related to epistaxis, dizziness, progressive dyspnea only with activity, balance, and appetite.  We will arrange for repeat imaging studies with a CT of the chest, abdomen, and pelvis to be performed in the next 1 to 2 weeks.  We will also repeat a brain MRI.  If the brain MRI is negative for any metastatic disease to the brain, discussed with the patient that we would recommend reaching out to his prescribing provider for his Coreg and metoprolol.  The patient typically is bradycardic at his appointments and they may need to consider dose adjustment of these medications which may be causing occasional lightheadedness.  We would also asked that they talk to their PCP about whether he would benefit from physical therapy.  The patient does not use any assistive devices for his balance.  I offered to place an order for a cane or walker for him today but he declined.   Regarding, his breathing, the patient's prior imaging studies do mention he has moderate pulmonary fibrosis.  If  there are no acute findings, disease progression, drug toxicity, or signs of infection to explain his worsening shortness of  breath, we may consider referral to pulmonary medicine at his next appointment to see if there is any recommendations optimize his breathing for his pulmonary fibrosis.  The patient also has cardiomegaly on scans and has history of valve replacement and atrial fibrillation.  It may not be a bad idea to be reevaluated by cardiology as well since he has not seen them in several years.  The patient was previously referred to ENT at his last appointment due to his epistaxis.  We will follow-up on this referral.  In the meantime the patient was instructed to use saline spray in his nose daily to keep his nose moist and to avoid any forceful blowing of the nose.  If he has any significant bleeding, he was told to seek emergency room evaluation.  Regarding his appetite, the patient is already drinking 3 protein supplemental drinks per day.  He also struggles with insomnia for which he takes melatonin.  He is likely not a good candidate for Megace due to risks of blood clot. He is not a good candidate for Marinol given his age and that there is currently a back order of this medication. He is currently on a blood thinner for atrial fibrillation.  He would be open to taking Remeron to help with his insomnia and increase his appetite.  I sent a prescription to the pharmacy.  The patient has intermittent mild nausea.  It does not sound like he has a current prescription for an antiemetic as his last refill of his Compazine was in 2022  I have refilled this today.  He will continue to take Imodium if needed for diarrhea and use hydrocortisone for his rash.   We will see the patient back for follow-up visit in 2 weeks to review his scan results and discuss next steps.  The patient was advised to call immediately if he has any concerning symptoms in the interval. The patient voices  understanding of current disease status and treatment options and is in agreement with the current care plan. All questions were answered. The patient knows to call the clinic with any problems, questions or concerns. We can certainly see the patient much sooner if necessary    No orders of the defined types were placed in this encounter.     The total time spent in the appointment was 30-39 minutes   L , PA-C 01/08/23

## 2023-01-13 ENCOUNTER — Other Ambulatory Visit: Payer: Self-pay

## 2023-01-13 ENCOUNTER — Inpatient Hospital Stay: Payer: Medicare Other | Admitting: Physician Assistant

## 2023-01-13 ENCOUNTER — Inpatient Hospital Stay: Payer: Medicare Other | Attending: Physician Assistant

## 2023-01-13 ENCOUNTER — Other Ambulatory Visit (HOSPITAL_COMMUNITY): Payer: Self-pay

## 2023-01-13 VITALS — BP 134/63 | HR 60 | Temp 97.9°F | Resp 13 | Wt 144.0 lb

## 2023-01-13 DIAGNOSIS — M4802 Spinal stenosis, cervical region: Secondary | ICD-10-CM | POA: Diagnosis not present

## 2023-01-13 DIAGNOSIS — I2510 Atherosclerotic heart disease of native coronary artery without angina pectoris: Secondary | ICD-10-CM | POA: Diagnosis not present

## 2023-01-13 DIAGNOSIS — G4700 Insomnia, unspecified: Secondary | ICD-10-CM | POA: Insufficient documentation

## 2023-01-13 DIAGNOSIS — R5383 Other fatigue: Secondary | ICD-10-CM | POA: Diagnosis not present

## 2023-01-13 DIAGNOSIS — M5031 Other cervical disc degeneration,  high cervical region: Secondary | ICD-10-CM | POA: Insufficient documentation

## 2023-01-13 DIAGNOSIS — I4891 Unspecified atrial fibrillation: Secondary | ICD-10-CM | POA: Diagnosis not present

## 2023-01-13 DIAGNOSIS — K5900 Constipation, unspecified: Secondary | ICD-10-CM | POA: Diagnosis not present

## 2023-01-13 DIAGNOSIS — R197 Diarrhea, unspecified: Secondary | ICD-10-CM | POA: Insufficient documentation

## 2023-01-13 DIAGNOSIS — Z7901 Long term (current) use of anticoagulants: Secondary | ICD-10-CM | POA: Insufficient documentation

## 2023-01-13 DIAGNOSIS — R42 Dizziness and giddiness: Secondary | ICD-10-CM | POA: Diagnosis not present

## 2023-01-13 DIAGNOSIS — C782 Secondary malignant neoplasm of pleura: Secondary | ICD-10-CM | POA: Diagnosis not present

## 2023-01-13 DIAGNOSIS — Z923 Personal history of irradiation: Secondary | ICD-10-CM | POA: Insufficient documentation

## 2023-01-13 DIAGNOSIS — Z800 Family history of malignant neoplasm of digestive organs: Secondary | ICD-10-CM | POA: Diagnosis not present

## 2023-01-13 DIAGNOSIS — R110 Nausea: Secondary | ICD-10-CM | POA: Insufficient documentation

## 2023-01-13 DIAGNOSIS — R0602 Shortness of breath: Secondary | ICD-10-CM

## 2023-01-13 DIAGNOSIS — I6782 Cerebral ischemia: Secondary | ICD-10-CM | POA: Insufficient documentation

## 2023-01-13 DIAGNOSIS — R634 Abnormal weight loss: Secondary | ICD-10-CM | POA: Insufficient documentation

## 2023-01-13 DIAGNOSIS — F419 Anxiety disorder, unspecified: Secondary | ICD-10-CM | POA: Insufficient documentation

## 2023-01-13 DIAGNOSIS — J8410 Pulmonary fibrosis, unspecified: Secondary | ICD-10-CM | POA: Diagnosis not present

## 2023-01-13 DIAGNOSIS — Z79899 Other long term (current) drug therapy: Secondary | ICD-10-CM | POA: Diagnosis not present

## 2023-01-13 DIAGNOSIS — R001 Bradycardia, unspecified: Secondary | ICD-10-CM | POA: Diagnosis not present

## 2023-01-13 DIAGNOSIS — Z8546 Personal history of malignant neoplasm of prostate: Secondary | ICD-10-CM | POA: Insufficient documentation

## 2023-01-13 DIAGNOSIS — I700 Atherosclerosis of aorta: Secondary | ICD-10-CM | POA: Insufficient documentation

## 2023-01-13 DIAGNOSIS — R21 Rash and other nonspecific skin eruption: Secondary | ICD-10-CM | POA: Insufficient documentation

## 2023-01-13 DIAGNOSIS — J479 Bronchiectasis, uncomplicated: Secondary | ICD-10-CM | POA: Diagnosis not present

## 2023-01-13 DIAGNOSIS — C3492 Malignant neoplasm of unspecified part of left bronchus or lung: Secondary | ICD-10-CM

## 2023-01-13 DIAGNOSIS — C3490 Malignant neoplasm of unspecified part of unspecified bronchus or lung: Secondary | ICD-10-CM

## 2023-01-13 DIAGNOSIS — C3412 Malignant neoplasm of upper lobe, left bronchus or lung: Secondary | ICD-10-CM | POA: Diagnosis present

## 2023-01-13 LAB — CMP (CANCER CENTER ONLY)
ALT: 15 U/L (ref 0–44)
AST: 24 U/L (ref 15–41)
Albumin: 4.1 g/dL (ref 3.5–5.0)
Alkaline Phosphatase: 89 U/L (ref 38–126)
Anion gap: 6 (ref 5–15)
BUN: 40 mg/dL — ABNORMAL HIGH (ref 8–23)
CO2: 25 mmol/L (ref 22–32)
Calcium: 9.5 mg/dL (ref 8.9–10.3)
Chloride: 105 mmol/L (ref 98–111)
Creatinine: 0.83 mg/dL (ref 0.61–1.24)
GFR, Estimated: >60 mL/min (ref >60)
Glucose, Bld: 155 mg/dL — ABNORMAL HIGH (ref 70–99)
Potassium: 4.1 mmol/L (ref 3.5–5.1)
Sodium: 136 mmol/L (ref 135–145)
Total Bilirubin: 0.8 mg/dL (ref 0.3–1.2)
Total Protein: 7.8 g/dL (ref 6.5–8.1)

## 2023-01-13 LAB — CBC WITH DIFFERENTIAL (CANCER CENTER ONLY)
Abs Immature Granulocytes: 0.04 10*3/uL (ref 0.00–0.07)
Basophils Absolute: 0 10*3/uL (ref 0.0–0.1)
Basophils Relative: 0 %
Eosinophils Absolute: 0.3 10*3/uL (ref 0.0–0.5)
Eosinophils Relative: 3 %
HCT: 40.3 % (ref 39.0–52.0)
Hemoglobin: 13.4 g/dL (ref 13.0–17.0)
Immature Granulocytes: 1 %
Lymphocytes Relative: 18 %
Lymphs Abs: 1.3 10*3/uL (ref 0.7–4.0)
MCH: 29.7 pg (ref 26.0–34.0)
MCHC: 33.3 g/dL (ref 30.0–36.0)
MCV: 89.4 fL (ref 80.0–100.0)
Monocytes Absolute: 0.7 10*3/uL (ref 0.1–1.0)
Monocytes Relative: 10 %
Neutro Abs: 5 10*3/uL (ref 1.7–7.7)
Neutrophils Relative %: 68 %
Platelet Count: 103 10*3/uL — ABNORMAL LOW (ref 150–400)
RBC: 4.51 MIL/uL (ref 4.22–5.81)
RDW: 15.2 % (ref 11.5–15.5)
WBC Count: 7.4 10*3/uL (ref 4.0–10.5)
nRBC: 0 % (ref 0.0–0.2)

## 2023-01-13 LAB — IRON AND IRON BINDING CAPACITY (CC-WL,HP ONLY)
Iron: 57 ug/dL (ref 45–182)
Saturation Ratios: 12 % — ABNORMAL LOW (ref 17.9–39.5)
TIBC: 487 ug/dL — ABNORMAL HIGH (ref 250–450)
UIBC: 430 ug/dL — ABNORMAL HIGH (ref 117–376)

## 2023-01-13 MED ORDER — PROCHLORPERAZINE MALEATE 10 MG PO TABS: 10.0000 mg | ORAL_TABLET | Freq: Four times a day (QID) | ORAL | 0 refills | Status: DC | PRN

## 2023-01-13 MED ORDER — MIRTAZAPINE 15 MG PO TABS: 15.0000 mg | ORAL_TABLET | Freq: Every day | ORAL | 2 refills | Status: DC

## 2023-01-14 ENCOUNTER — Encounter: Payer: Self-pay | Admitting: Internal Medicine

## 2023-01-14 ENCOUNTER — Encounter: Payer: Self-pay | Admitting: Physician Assistant

## 2023-01-14 LAB — FERRITIN: Ferritin: 26 ng/mL (ref 24–336)

## 2023-01-14 NOTE — Progress Notes (Signed)
Faxed referral, patient demographics and last office notes to [facility] today  [phone] Phone: [phone].

## 2023-01-15 ENCOUNTER — Telehealth: Payer: Self-pay | Admitting: Physician Assistant

## 2023-01-15 NOTE — Telephone Encounter (Signed)
I called the patient's daughter to give a summary of his appointment earlier this week (please refer to progress note).  He also mentions that they have an appointment conflict for her next appointment on 01/27/2023.  I will talk to scheduling to reschedule this for 01/26/2023.

## 2023-01-20 ENCOUNTER — Ambulatory Visit (HOSPITAL_COMMUNITY)
Admission: RE | Admit: 2023-01-20 | Discharge: 2023-01-20 | Disposition: A | Discharge status: Home / Self Care | Payer: Medicare Other | Source: Ambulatory Visit | Attending: Physician Assistant | Admitting: Physician Assistant

## 2023-01-20 DIAGNOSIS — R42 Dizziness and giddiness: Secondary | ICD-10-CM | POA: Insufficient documentation

## 2023-01-20 DIAGNOSIS — R0602 Shortness of breath: Secondary | ICD-10-CM

## 2023-01-20 DIAGNOSIS — C3490 Malignant neoplasm of unspecified part of unspecified bronchus or lung: Secondary | ICD-10-CM

## 2023-01-20 MED ORDER — IOHEXOL 300 MG/ML  SOLN
100.0000 mL | Freq: Once | INTRAMUSCULAR | Status: AC | PRN
  Administered 2023-01-20: 100 mL via INTRAVENOUS

## 2023-01-20 MED ORDER — GADOBUTROL 1 MMOL/ML IV SOLN
6.5000 mL | Freq: Once | INTRAVENOUS | Status: AC | PRN
  Administered 2023-01-20: 6.5 mL via INTRAVENOUS

## 2023-01-22 NOTE — Progress Notes (Deleted)
[facility] OFFICE PROGRESS NOTE  [name], MD [address] [place] [address]  DIAGNOSIS: Recurrent/metastatic non-small cell lung cancer, adenocarcinoma initially diagnosed as a stage Ib (T2 a, N0, M0) adenocarcinoma in September 2020 status post wedge resection of the left upper lobe nodule.  The patient has disease recurrence and September 2022 with metastatic mediastinal lymphadenopathy as well as left hilar and left pleural metastatic disease    PD-L1 expression was negative.   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  First dose April 30, 2021. Status post 2 cycles. Discontinued due to molecular studies positive for EGFR mutation  CURRENT THERAPY: Tarceva 150 mg p.o. daily started June 28, 2021.  Status post 2 month of treatment.  His dose was changed to Tarceva 100 mg p.o. daily on 09/04/2021 secondary to intolerance and frequent skin rash.  Status post ***  months of treatment   INTERVAL HISTORY: Vincent Velez 82 y.o. male returns to the clinic today for follow-up visit accompanied by ***.  Patient was last seen on 01/13/2023.  The patient had several concerns at that time such as continuing significant epistaxis, significant weight loss, taste alterations, dyspnea with activity, bradycardia and dizziness, and iron deficiency.  Regarding the epistaxis, the patient was referred to ENT and he has an appointment on **.  He was instructed in the meantime to use saline spray which has ***.   Regarding his weight loss, I started him on Remeron.  Referral to nutrition?  For his taste alterations, he is advised to  salt water rinses and Biotene.  For the bradycardia, advised that he follow-up with cardiology regarding his blood pressure medications which can cause bradycardia and possibly contribute to his lightheadedness.  He was also advised to resume taking iron supplement due to iron deficiency.  Since last being seen, he reports his health is ***denies any fever, chills, or night sweats.  Weight loss?   Shortness of breath and cough?  Denies any chest pain or hemoptysis.  Denies any vomiting.  He may have mild nausea.  He was not aware in his last appointment that he had an antiemetic so I refilled this for him.  He does continue to have diarrhea secondary to his Tarceva for which he uses Imodium.  He also continues to have intermittent rashes for which he uses hydrocortisone cream.  He recently had a restaging CT scan performed.    * MEDICAL HISTORY: Past Medical History:  Diagnosis Date   AAA (abdominal aortic aneurysm) (HCC)    Anxiety    Arthritis    Asthma    when younger   Atrial fibrillation (HCC)    CAD (coronary artery disease)    Cataract    removed both   COPD (chronic obstructive pulmonary disease) (HCC)    Diabetes mellitus    Type II   Dysrhythmia    afib   Esophageal reflux    Family history of malignant neoplasm of gastrointestinal tract    Heart murmur    Hiatal hernia    Hyperlipemia    Hypertension    Lung nodule    a. PET scan highly suspicious for  malignancy. Bronch with biopsy to be done after TAVR   Malignant neoplasm of prostate [facility])    prostate    Peripheral vascular disease (HCC)    Pneumonia    Stricture and stenosis of esophagus     ALLERGIES:  is allergic to macrolides and ketolides.  MEDICATIONS:  Current Outpatient Medications  Medication Sig Dispense Refill   ACCU-CHEK GUIDE test strip USE TO CHECK YOUR BLOOD SUGAR ONCE DAILY     acetaminophen (TYLENOL) 500 MG tablet Take 2 tablets (1,000 mg total) by mouth every 6 (six) hours as needed for  mild pain or fever. 30 tablet 0   diltiazem (CARDIZEM SR) 60 MG 12 hr capsule Take 1 capsule (60 mg total) by mouth every 12 (twelve) hours. 60 capsule 7   dorzolamide-timolol (COSOPT) 22.3-6.8 MG/ML ophthalmic solution Place 1 drop into both eyes in the morning, at noon, and at bedtime.     erlotinib (TARCEVA) 100 MG tablet Take 1 tablet (100 mg total) by mouth daily. Take on an empty stomach 1 hour before meals or 2 hours after. 30 tablet 3   glimepiride (AMARYL) 2 MG tablet Take 2 mg by mouth daily with breakfast.     JARDIANCE 25 MG TABS tablet Take 25 mg by mouth daily.     metFORMIN (GLUCOPHAGE-XR) 500 MG 24 hr tablet Take 1,000 mg by mouth every evening.      methylPREDNISolone (MEDROL DOSEPAK) 4 MG TBPK tablet Use as instructed 21 tablet 0   metoprolol succinate (TOPROL-XL) 25 MG 24 hr tablet Take 50 mg by mouth daily.     mirtazapine (REMERON) 15 MG tablet Take 1 tablet (15 mg total) by mouth at bedtime. 30 tablet 2   Multiple Vitamins-Minerals (MULTIVITAMIN WITH MINERALS) tablet Take 1 tablet by mouth daily. (Patient not taking: Reported on 08/24/2022)     Multiple Vitamins-Minerals (ZINC PO) Take 1 tablet by mouth daily. (Patient not taking: Reported on 08/24/2022)     prochlorperazine (COMPAZINE) 10 MG tablet Take 1 tablet (10 mg total) by mouth every 6 (six) hours as needed for nausea or vomiting. 30 tablet 0   simvastatin (ZOCOR) 40 MG tablet Take 40 mg by mouth at bedtime.     XARELTO 20 MG TABS tablet TAKE 1 TABLET BY MOUTH  DAILY WITH SUPPER 90 tablet 1   No current facility-administered medications for this visit.    SURGICAL HISTORY:  Past Surgical History:  Procedure Laterality Date   ABDOMINAL AORTA STENT     CARDIAC CATHETERIZATION     CARDIOVERSION N/A 12/04/2015   Procedure: CARDIOVERSION;  Surgeon: Thurmon Fair, MD;  Location: [facility];  Service: Cardiovascular;  Laterality: N/A;   COLONOSCOPY     FINGER SURGERY Right    middle finger- amputation   INSERTION  PROSTATE RADIATION SEED     MEDIASTINOSCOPY N/A 01/06/2019   Procedure: MEDIASTINOSCOPY WITH BIOPIES;  Surgeon: Alleen Borne, MD;  Location: [facility] OR;  Service: Thoracic;  Laterality: N/A;   RIGHT/LEFT HEART CATH AND CORONARY ANGIOGRAPHY N/A 10/26/2018   Procedure: RIGHT/LEFT HEART CATH AND CORONARY ANGIOGRAPHY;  Surgeon: Kathleene Hazel, MD;  Location: [facility] INVASIVE CV LAB;  Service: Cardiovascular;  Laterality: N/A;   TEE WITHOUT CARDIOVERSION N/A 12/06/2018   Procedure: TRANSESOPHAGEAL ECHOCARDIOGRAM (TEE);  Surgeon: Kathleene Hazel, MD;  Location: [facility] INVASIVE CV LAB;  Service: Open Heart Surgery;  Laterality: N/A;   TRANSCATHETER AORTIC VALVE REPLACEMENT, TRANSFEMORAL N/A 12/06/2018   Procedure: TRANSCATHETER AORTIC VALVE REPLACEMENT, TRANSFEMORAL;  Surgeon: Verne Carrow  D, MD;  Location: [facility] INVASIVE CV LAB;  Service: Open Heart Surgery;  Laterality: N/A;   UPPER GASTROINTESTINAL ENDOSCOPY     VIDEO ASSISTED THORACOSCOPY (VATS)/THOROCOTOMY Left 02/02/2019   Procedure: VIDEO ASSISTED THORACOSCOPY (VATS)/THOROCOTOMY;  Surgeon: Alleen Borne, MD;  Location: [facility] OR;  Service: Thoracic;  Laterality: Left;   VIDEO BRONCHOSCOPY N/A 01/06/2019   Procedure: VIDEO BRONCHOSCOPY;  Surgeon: Alleen Borne, MD;  Location: [facility] OR;  Service: Thoracic;  Laterality: N/A;   VIDEO BRONCHOSCOPY WITH ENDOBRONCHIAL ULTRASOUND N/A 03/07/2021   Procedure: VIDEO BRONCHOSCOPY WITH ENDOBRONCHIAL ULTRASOUND;  Surgeon: Loreli Slot, MD;  Location: [facility] OR;  Service: Thoracic;  Laterality: N/A;   WEDGE RESECTION Left 02/02/2019   Procedure: LUNG RESECTION;  Surgeon: Alleen Borne, MD;  Location: [facility] OR;  Service: Thoracic;  Laterality: Left;    REVIEW OF SYSTEMS:   Review of Systems  Constitutional: Negative for appetite change, chills, fatigue, fever and unexpected weight change.  HENT:   Negative for mouth sores, nosebleeds, sore throat and trouble swallowing.   Eyes: Negative for eye problems and  icterus.  Respiratory: Negative for cough, hemoptysis, shortness of breath and wheezing.   Cardiovascular: Negative for chest pain and leg swelling.  Gastrointestinal: Negative for abdominal pain, constipation, diarrhea, nausea and vomiting.  Genitourinary: Negative for bladder incontinence, difficulty urinating, dysuria, frequency and hematuria.   Musculoskeletal: Negative for back pain, gait problem, neck pain and neck stiffness.  Skin: Negative for itching and rash.  Neurological: Negative for dizziness, extremity weakness, gait problem, headaches, light-headedness and seizures.  Hematological: Negative for adenopathy. Does not bruise/bleed easily.  Psychiatric/Behavioral: Negative for confusion, depression and sleep disturbance. The patient is not nervous/anxious.     PHYSICAL EXAMINATION:  There were no vitals taken for this visit.  ECOG PERFORMANCE STATUS: {CHL ONC ECOG [id number]  Physical Exam  Constitutional: Oriented to person, place, and time and well-developed, well-nourished, and in no distress. No distress.  HENT:  Head: Normocephalic and atraumatic.  Mouth/Throat: Oropharynx is clear and moist. No oropharyngeal exudate.  Eyes: Conjunctivae are normal. Right eye exhibits no discharge. Left eye exhibits no discharge. No scleral icterus.  Neck: Normal range of motion. Neck supple.  Cardiovascular: Normal rate, regular rhythm, normal heart sounds and intact distal pulses.   Pulmonary/Chest: Effort normal and breath sounds normal. No respiratory distress. No wheezes. No rales.  Abdominal: Soft. Bowel sounds are normal. Exhibits no distension and no mass. There is no tenderness.  Musculoskeletal: Normal range of motion. Exhibits no edema.  Lymphadenopathy:    No cervical adenopathy.  Neurological: Alert and oriented to person, place, and time. Exhibits normal muscle tone. Gait normal. Coordination normal.  Skin: Skin is warm and dry. No rash noted. Not diaphoretic. No  erythema. No pallor.  Psychiatric: Mood, memory and judgment normal.  Vitals reviewed.  LABORATORY DATA: Lab Results  Component Value Date   WBC 7.4 01/13/2023   HGB 13.4 01/13/2023   HCT 40.3 01/13/2023   MCV 89.4 01/13/2023   PLT 103 (L) 01/13/2023      Chemistry      Component Value Date/Time   NA 136 01/13/2023 1433   NA 138 12/06/2019 1438   K 4.1 01/13/2023 1433   CL 105 01/13/2023 1433   CO2 25 01/13/2023 1433   BUN 40 (H) 01/13/2023 1433   BUN 14 12/06/2019 1438   CREATININE 0.83 01/13/2023 1433   CREATININE 0.81 11/29/2015 1335      Component Value Date/Time   CALCIUM 9.5  01/13/2023 1433   ALKPHOS 89 01/13/2023 1433   AST 24 01/13/2023 1433   ALT 15 01/13/2023 1433   BILITOT 0.8 01/13/2023 1433       RADIOGRAPHIC STUDIES:  No results found.   ASSESSMENT/PLAN:  This is a very pleasant 82 year old Caucasian male diagnosed with recurrent/metastatic non-small cell lung cancer, adenocarcinoma that was initially diagnosed as stage Ib (T2 a, N0, M0) adenocarcinoma in September 2020 status post wedge resection of the left lower lobe lung nodule and he had recurrence in September 2022 presented with metastatic mediastinal lymphadenopathy in addition to left hilar and left pleural metastatic disease.  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    He underwent 2 cycles of systemic palliative chemotherapy with carboplatin AUC of 5 and Alimta 500 mg per metered squared and Keytruda 200 mg IV every 3 weeks.  He received 2 cycles.  This was discontinued due to receiving the results of his molecular studies which show EGFR mutation.   He was started on treatment with Tarceva 150 mg p.o. daily on 06/28/2021.  He was not a good candidate for Tagrisso because of his prolonged QT and concern about arrhythmia while on Tagrisso.  His dose was reduced to 100 mg p.o. daily of Tarceva. His dose was reduced in April 2023. He is  tolerating this well except for manageable diarrhea.   Patient was last seen 2 weeks ago but he was having several concerns at that time.  Therefore we arrange for restaging CT scan.  The patient was seen with Dr. Arbutus Ped today.  Dr. Arbutus Ped personally independently reviewed the scan and discussed results with the patient today.  The scan showed ***  Dr. Arbutus Ped recommends that he continue on the same treatment at the same dose.  The patient's scans do always note some pulmonary fibrosis.  I will refer the patient to pulmonary medicine to see if they have any recommendations how to optimize his breathing.  He also has cardiomegaly and I encouraged him to follow-up with his cardiologist in addition to adjustment of his blood pressure medications which can cause bradycardia and lightheadedness.  He will see ENT on ***as scheduled.  He will continue Remeron regarding his decreased appetite and insomnia.  He has a prescription for Compazine if needed for nausea.  He will continue using the hydrocortisone cream if needed for rash.  We will see him back for a follow-up visit in ***  The patient was advised to call immediately if he has any concerning symptoms in the interval. The patient voices understanding of current disease status and treatment options and is in agreement with the current care plan. All questions were answered. The patient knows to call the clinic with any problems, questions or concerns. We can certainly see the patient much sooner if necessary      No orders of the defined types were placed in this encounter.    I spent {CHL ONC TIME VISIT - ZOXWR:[id number]} counseling the patient face to face. The total time spent in the appointment was {CHL ONC TIME VISIT - BJYNW:[id number]}.  Sequoia Witz L Emmy Keng, PA-C 01/22/23

## 2023-01-25 ENCOUNTER — Other Ambulatory Visit: Payer: Self-pay

## 2023-01-25 ENCOUNTER — Encounter (HOSPITAL_COMMUNITY): Payer: Self-pay

## 2023-01-25 ENCOUNTER — Emergency Department (HOSPITAL_COMMUNITY)
Admission: EM | Admit: 2023-01-25 | Discharge: 2023-01-25 | Disposition: A | Discharge status: Home / Self Care | Payer: Medicare Other | Attending: Emergency Medicine | Admitting: Emergency Medicine

## 2023-01-25 ENCOUNTER — Telehealth: Payer: Self-pay

## 2023-01-25 ENCOUNTER — Emergency Department (HOSPITAL_COMMUNITY): Payer: Medicare Other

## 2023-01-25 DIAGNOSIS — I6350 Cerebral infarction due to unspecified occlusion or stenosis of unspecified cerebral artery: Secondary | ICD-10-CM

## 2023-01-25 DIAGNOSIS — I639 Cerebral infarction, unspecified: Secondary | ICD-10-CM | POA: Diagnosis not present

## 2023-01-25 DIAGNOSIS — Z7984 Long term (current) use of oral hypoglycemic drugs: Secondary | ICD-10-CM | POA: Diagnosis not present

## 2023-01-25 DIAGNOSIS — J449 Chronic obstructive pulmonary disease, unspecified: Secondary | ICD-10-CM | POA: Insufficient documentation

## 2023-01-25 DIAGNOSIS — C3490 Malignant neoplasm of unspecified part of unspecified bronchus or lung: Secondary | ICD-10-CM | POA: Insufficient documentation

## 2023-01-25 DIAGNOSIS — Z7901 Long term (current) use of anticoagulants: Secondary | ICD-10-CM | POA: Insufficient documentation

## 2023-01-25 DIAGNOSIS — I2510 Atherosclerotic heart disease of native coronary artery without angina pectoris: Secondary | ICD-10-CM | POA: Diagnosis not present

## 2023-01-25 DIAGNOSIS — E119 Type 2 diabetes mellitus without complications: Secondary | ICD-10-CM | POA: Diagnosis not present

## 2023-01-25 LAB — LIPID PANEL
Cholesterol: 133 mg/dL (ref 0–200)
HDL: 63 mg/dL (ref >40)
LDL Cholesterol: 54 mg/dL (ref 0–99)
Total CHOL/HDL Ratio: 2.1 RATIO
Triglycerides: 78 mg/dL (ref <150)
VLDL: 16 mg/dL (ref 0–40)

## 2023-01-25 LAB — CBC
HCT: 40.3 % (ref 39.0–52.0)
Hemoglobin: 13.1 g/dL (ref 13.0–17.0)
MCH: 28.8 pg (ref 26.0–34.0)
MCHC: 32.5 g/dL (ref 30.0–36.0)
MCV: 88.6 fL (ref 80.0–100.0)
Platelets: 99 10*3/uL — ABNORMAL LOW (ref 150–400)
RBC: 4.55 MIL/uL (ref 4.22–5.81)
RDW: 15.9 % — ABNORMAL HIGH (ref 11.5–15.5)
WBC: 7.4 10*3/uL (ref 4.0–10.5)
nRBC: 0 % (ref 0.0–0.2)

## 2023-01-25 LAB — BASIC METABOLIC PANEL
Anion gap: 14 (ref 5–15)
BUN: 29 mg/dL — ABNORMAL HIGH (ref 8–23)
CO2: 21 mmol/L — ABNORMAL LOW (ref 22–32)
Calcium: 9.4 mg/dL (ref 8.9–10.3)
Chloride: 100 mmol/L (ref 98–111)
Creatinine, Ser: 0.91 mg/dL (ref 0.61–1.24)
GFR, Estimated: >60 mL/min (ref >60)
Glucose, Bld: 135 mg/dL — ABNORMAL HIGH (ref 70–99)
Potassium: 4.5 mmol/L (ref 3.5–5.1)
Sodium: 135 mmol/L (ref 135–145)

## 2023-01-25 LAB — HEMOGLOBIN A1C
Hgb A1c MFr Bld: 6.6 % — ABNORMAL HIGH (ref 4.8–5.6)
Mean Plasma Glucose: 142.72 mg/dL

## 2023-01-25 MED ORDER — IOHEXOL 350 MG/ML SOLN
75.0000 mL | Freq: Once | INTRAVENOUS | Status: AC | PRN
  Administered 2023-01-25: 75 mL via INTRAVENOUS

## 2023-01-25 NOTE — Consult Note (Signed)
NEUROLOGY CONSULTATION NOTE   Date of service: January 25, 2023 Patient Name: Vincent Velez MRN:  [id number] DOB:  04-07-1941 Reason for consult: "stroke on MRI" Requesting Provider: Terald Sleeper, MD   History of Present Illness  Vincent Velez is a 82 y.o. 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  On exam, patient has no neurological deficits. Denies headache, dizziness, nausea, recent acute  illnesses, recent vaccinations. Endorses transient numbness in his lips last week. Endorses trouble swallowing that has increased during his cancer treatment, but has not worsened acutely.   NIHSS components Score: Comment  1a Level of Conscious 0[x]  1[]  2[]  3[]      1b LOC Questions 0[x]  1[]  2[]       1c LOC Commands 0[x]  1[]  2[]       2 Best Gaze 0[x]  1[]  2[]       3 Visual 0[x]  1[]  2[]  3[]      4 Facial Palsy 0[x]  1[]  2[]  3[]      5a Motor Arm - left 0[x]  1[]  2[]  3[]  4[]  UN[]    5b Motor Arm - Right 0[x]  1[]  2[]  3[]  4[]  UN[]    6a Motor Leg - Left 0[x]  1[]  2[]  3[]  4[]  UN[]    6b Motor Leg - Right 0[x]  1[]  2[]  3[]  4[]  UN[]    7 Limb Ataxia 0[x]  1[]  2[]  3[]  UN[]     8 Sensory 0[x]  1[]  2[]  UN[]      9 Best Language 0[x]  1[]  2[]  3[]      10 Dysarthria 0[x]  1[]  2[]  UN[]      11 Extinct. and Inattention 0[x]  1[]  2[]       TOTAL:   0       ROS   Constitutional Denies weight loss, fever and chills.   HEENT Denies changes in vision and hearing.   Respiratory Denies SOB and cough.   CV Denies palpitations and CP   GI Denies abdominal pain, nausea, vomiting and diarrhea.   GU Denies dysuria and urinary frequency.   MSK Denies myalgia and joint pain.   Skin Denies rash and pruritus.   Neurological Denies headache and syncope.   Psychiatric Denies recent changes in mood.  Denies anxiety and depression.    Past History   Past Medical History:  Diagnosis Date   AAA (abdominal aortic aneurysm) (HCC)    Anxiety    Arthritis    Asthma    when younger   Atrial fibrillation (HCC)    CAD (coronary artery disease)    Cataract    removed both   COPD (chronic obstructive pulmonary disease) (HCC)    Diabetes mellitus    Type II   Dysrhythmia    afib   Esophageal reflux    Family history of malignant neoplasm of gastrointestinal tract    Heart murmur    Hiatal hernia    Hyperlipemia    Hypertension    Lung nodule    a. PET scan highly suspicious for malignancy. Bronch with biopsy to be done after TAVR   Malignant neoplasm of prostate [facility])    prostate    Peripheral vascular disease (HCC)    Pneumonia    Stricture and stenosis of esophagus    Past Surgical History:  Procedure Laterality Date   ABDOMINAL AORTA STENT     CARDIAC CATHETERIZATION     CARDIOVERSION N/A  12/04/2015   Procedure: CARDIOVERSION;  Surgeon: Thurmon Fair, MD;  Location: [facility];  Service: Cardiovascular;  Laterality: N/A;   COLONOSCOPY     FINGER SURGERY Right    middle finger- amputation   INSERTION PROSTATE RADIATION SEED     MEDIASTINOSCOPY N/A 01/06/2019   Procedure: MEDIASTINOSCOPY WITH BIOPIES;  Surgeon: Alleen Borne, MD;  Location: [facility] OR;  Service: Thoracic;  Laterality: N/A;   RIGHT/LEFT HEART CATH AND CORONARY ANGIOGRAPHY N/A 10/26/2018   Procedure: RIGHT/LEFT HEART CATH AND CORONARY ANGIOGRAPHY;  Surgeon: Kathleene Hazel, MD;  Location: [facility] INVASIVE CV LAB;  Service: Cardiovascular;  Laterality: N/A;   TEE WITHOUT CARDIOVERSION N/A 12/06/2018   Procedure: TRANSESOPHAGEAL ECHOCARDIOGRAM (TEE);  Surgeon: Kathleene Hazel, MD;  Location: [facility] INVASIVE CV LAB;  Service: Open Heart Surgery;  Laterality: N/A;   TRANSCATHETER AORTIC VALVE REPLACEMENT, TRANSFEMORAL N/A 12/06/2018   Procedure: TRANSCATHETER AORTIC VALVE REPLACEMENT, TRANSFEMORAL;  Surgeon:  Kathleene Hazel, MD;  Location: [facility] INVASIVE CV LAB;  Service: Open Heart Surgery;  Laterality: N/A;   UPPER GASTROINTESTINAL ENDOSCOPY     VIDEO ASSISTED THORACOSCOPY (VATS)/THOROCOTOMY Left 02/02/2019   Procedure: VIDEO ASSISTED THORACOSCOPY (VATS)/THOROCOTOMY;  Surgeon: Alleen Borne, MD;  Location: [facility] OR;  Service: Thoracic;  Laterality: Left;   VIDEO BRONCHOSCOPY N/A 01/06/2019   Procedure: VIDEO BRONCHOSCOPY;  Surgeon: Alleen Borne, MD;  Location: [facility] OR;  Service: Thoracic;  Laterality: N/A;   VIDEO BRONCHOSCOPY WITH ENDOBRONCHIAL ULTRASOUND N/A 03/07/2021   Procedure: VIDEO BRONCHOSCOPY WITH ENDOBRONCHIAL ULTRASOUND;  Surgeon: Loreli Slot, MD;  Location: [facility] OR;  Service: Thoracic;  Laterality: N/A;   WEDGE RESECTION Left 02/02/2019   Procedure: LUNG RESECTION;  Surgeon: Alleen Borne, MD;  Location: [facility] OR;  Service: Thoracic;  Laterality: Left;   Family History  Problem Relation Age of Onset   Colon cancer Father        questionable   Diabetes Mother    Heart disease Mother        Heart Disease before age 59   Deep vein thrombosis Mother    Hyperlipidemia Mother    Hypertension Mother    Varicose Veins Mother    Hypertension Brother    Heart attack Brother    Prostate cancer Brother    Colon polyps Neg Hx    Esophageal cancer Neg Hx    Rectal cancer Neg Hx    Stomach cancer Neg Hx    Social History   Socioeconomic History   Marital status: Widowed    Spouse name: Not on file   Number of children: 2   Years of education: Not on file   Highest education level: Not on file  Occupational History   Occupation: engineer-retired    Employer: [employer]  Tobacco Use   Smoking status: Former    Types: Pipe, Cigars    Quit date: 12/2018    Years since quitting: 4.0   Smokeless tobacco: Never  Vaping Use   Vaping status: Never Used  Substance and Sexual Activity   Alcohol use: Not Currently    Alcohol/week: 0.0 standard drinks of alcohol    Comment: occ    Drug use: No   Sexual activity: Not on file  Other Topics Concern   Not on file  Social History Narrative   Not on file   Social Determinants of Health   Financial Resource Strain: Not on file  Food Insecurity: Not on file  Transportation Needs: Not on file  Physical Activity: Not on  file  Stress: Not on file  Social Connections: Not on file   Allergies  Allergen Reactions   Macrolides And Ketolides Other (See Comments)    Pt unsure of this     Medications  (Not in a hospital admission)    Vitals   Vitals:   01/25/23 1510  BP: 96/64  Pulse: 66  Resp: 16  Temp: (!) 97.3 F (36.3 C)  TempSrc: Oral  SpO2: 94%     There is no height or weight on file to calculate BMI.  Physical Exam   General: Laying comfortably in bed; in no acute distress.  HENT: Normal oropharynx and mucosa. Normal external appearance of ears and nose.  Neck: Supple, no pain or tenderness  CV: No JVD. No peripheral edema.  Pulmonary: Symmetric Chest rise. Normal respiratory effort.  Abdomen: Soft to touch, non-tender.  Skin: No rash. Normal palpation of skin.   Musculoskeletal: Normal digits and nails by inspection.   Neurologic Examination  Mental status/Cognition: Alert, oriented to self, place, month and year, good attention.  Speech/language: Fluent, comprehension intact, object naming intact, repetition intact.  Cranial nerves:   CN II Pupils equal and reactive to light, no VF deficits    CN III,IV,VI EOM intact, no gaze preference or deviation, no nystagmus    CN V normal sensation in V1, V2, and V3 segments bilaterally    CN VII no asymmetry, no nasolabial fold flattening    CN VIII normal hearing to speech    CN IX & X normal palatal elevation, no uvular deviation    CN XI 5/5 head turn and 5/5 shoulder shrug bilaterally    CN XII midline tongue protrusion    Motor:  No focal weakness. 5/5 strength throughout.  No drift seen in any extremity.   Sensation: Intact to light  touch throughout.   Coordination/Complex Motor:  - Finger to Nose: intact - Gait: deferred  Labs   CBC: No results for input(s): "WBC", "NEUTROABS", "HGB", "HCT", "MCV", "PLT" in the last 168 hours.  Basic Metabolic Panel:  Lab Results  Component Value Date   NA 136 01/13/2023   K 4.1 01/13/2023   CO2 25 01/13/2023   GLUCOSE 155 (H) 01/13/2023   BUN 40 (H) 01/13/2023   CREATININE 0.83 01/13/2023   CALCIUM 9.5 01/13/2023   GFRNONAA >60 01/13/2023   GFRAA 99 12/06/2019   Lipid Panel:  Lab Results  Component Value Date   LDLCALC 60 09/15/2013   HgbA1c:  Lab Results  Component Value Date   HGBA1C 7.1 (H) 01/30/2019   Urine Drug Screen: No results found for: "LABOPIA", "COCAINSCRNUR", "LABBENZ", "AMPHETMU", "THCU", "LABBARB"  Alcohol Level No results found for: "ETH"  CT angio Head and Neck with contrast: pending   MRI Brain w wo (Personally reviewed): - Small acute infarct in the right hemi pons - No evidence of intracranial metastatic disease   Impression   JACE FREIER is a 82 y.o. 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 His neurologic examination is notable for no focal weakness or deficits, NIH 0.  As patient is already on xarelto, there is no added benefit to adding Aspirin with his age. No need for additional stroke workup inpatient unless CT Angio shows significant stenosis or LVO.  Patient can follow-up with PCP for  echo if desired. As it is known that patient has Afib currently, echo result would likely not change the treatment plan at this time.   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 If you have any further questions, please contact the neurology consultation attending.   Pt seen by Neuro NP/APP and later by MD. Note/plan to be edited by MD as needed.    Lynnae January, DNP, AGACNP-BC [facility] Please use AMION for contact information & EPIC for messaging.  NEUROHOSPITALIST ADDENDUM Performed a face to face diagnostic evaluation.   I have reviewed the contents of history and physical exam as documented by PA/ARNP/Resident and agree with above documentation.  I have discussed and formulated the above plan as documented. Edits to the note have been made as needed.  Impression/Key exam findings/Plan: MRI brain was obtained to assess for any brain mets but instead showed an incidental R pontine stroke. Suspect this is likely a small vessel stroke. He is on Xarelto for Afibb and should continue Xarelto. Will get HbA1c and LDL here along with CTA head and neck. If the CTA head and neck is non revealing, he can be discharged home with outpatient follow up with neurology. He has no deficit on his neuro exam.  Erick Blinks, MD [facility] [phone]   If 7pm to 7am, please call on call as listed on AMION.

## 2023-01-25 NOTE — ED Triage Notes (Signed)
Pt sent by oncologist due to acute stroke on MRI that he had done last week. Pt unsure what symptoms prompted the MRI. Pt c.o feeling more tired than usual and having balance issues. No neuro deficits noted in triage. Pt last chemo treatment was today.

## 2023-01-25 NOTE — ED Notes (Signed)
Neurologist Doctor at bedside.

## 2023-01-25 NOTE — ED Provider Notes (Signed)
[place] EMERGENCY DEPARTMENT AT [facility] Provider Note   CSN: [id number] Arrival date & time: 01/25/23  1456     History  Chief Complaint  Patient presents with   Cerebrovascular Accident    Vincent Velez is a 82 y.o. male referred into ED by doctor's office with concern for new acute small infarct of the right hemi pons noted on MRI brain on 01/20/23.  Patient is otherwise asymptomatic with no numbness, weakness, speech difficulty.  Medical records reviewed, oncology office not 01/23/23, noting hx of recurrent metastatic non small cell lung cancer with lymph node involvement.    He is on Xarelto for A Fib and takes this medication  HPI     Home Medications Prior to Admission medications   Medication Sig Start Date End Date Taking? Authorizing Provider  ACCU-CHEK GUIDE test strip USE TO CHECK YOUR BLOOD SUGAR ONCE DAILY 03/28/21   [provider]  acetaminophen (TYLENOL) 500 MG tablet Take 2 tablets (1,000 mg total) by mouth every 6 (six) hours as needed for mild pain or fever. 02/06/19   Barrett, Erin R, PA-C  diltiazem (CARDIZEM SR) 60 MG 12 hr capsule Take 1 capsule (60 mg total) by mouth every 12 (twelve) hours. 06/06/19   Janetta Hora, PA-C  dorzolamide-timolol (COSOPT) 22.3-6.8 MG/ML ophthalmic solution Place 1 drop into both eyes in the morning, at noon, and at bedtime. 06/05/19   [provider]  erlotinib (TARCEVA) 100 MG tablet Take 1 tablet (100 mg total) by mouth daily. Take on an empty stomach 1 hour before meals or 2 hours after. 08/20/22   Heilingoetter, Cassandra L, PA-C  glimepiride (AMARYL) 2 MG tablet Take 2 mg by mouth daily with breakfast.    [provider]  JARDIANCE 25 MG TABS tablet Take 25 mg by mouth daily. 03/01/21   [provider]  metFORMIN (GLUCOPHAGE-XR) 500 MG 24 hr tablet Take 1,000 mg by mouth every evening.  07/01/18   [provider]  methylPREDNISolone (MEDROL DOSEPAK) 4 MG TBPK  tablet Use as instructed 07/15/22   Heilingoetter, Cassandra L, PA-C  metoprolol succinate (TOPROL-XL) 25 MG 24 hr tablet Take 50 mg by mouth daily. 03/03/21   [provider]  mirtazapine (REMERON) 15 MG tablet Take 1 tablet (15 mg total) by mouth at bedtime. 01/13/23   Heilingoetter, Cassandra L, PA-C  Multiple Vitamins-Minerals (MULTIVITAMIN WITH MINERALS) tablet Take 1 tablet by mouth daily. Patient not taking: Reported on 08/24/2022    [provider]  Multiple Vitamins-Minerals (ZINC PO) Take 1 tablet by mouth daily. Patient not taking: Reported on 08/24/2022    [provider]  prochlorperazine (COMPAZINE) 10 MG tablet Take 1 tablet (10 mg total) by mouth every 6 (six) hours as needed for nausea or vomiting. 01/13/23   Heilingoetter, Cassandra L, PA-C  simvastatin (ZOCOR) 40 MG tablet Take 40 mg by mouth at bedtime.    [provider]  XARELTO 20 MG TABS tablet TAKE 1 TABLET BY MOUTH  DAILY WITH SUPPER 12/20/17   Jake Bathe, MD      Allergies    Macrolides and ketolides    Review of Systems   Review of Systems  Physical Exam Updated Vital Signs BP 124/64   Pulse (!) 55   Temp (!) 97.4 F (36.3 C) (Oral)   Resp 18   SpO2 99%  Physical Exam Constitutional:      General: He is not in acute distress. HENT:  Head: Normocephalic and atraumatic.  Eyes:     Conjunctiva/sclera: Conjunctivae normal.     Pupils: Pupils are equal, round, and reactive to light.  Cardiovascular:     Rate and Rhythm: Normal rate and regular rhythm.  Pulmonary:     Effort: Pulmonary effort is normal. No respiratory distress.  Abdominal:     General: There is no distension.     Tenderness: There is no abdominal tenderness.  Skin:    General: Skin is warm and dry.  Neurological:     General: No focal deficit present.     Mental Status: He is alert and oriented to person, place, and time. Mental status is at baseline.     Cranial Nerves: No cranial nerve deficit.      Sensory: No sensory deficit.     Motor: No weakness.     Coordination: Coordination normal.     Comments: Left arm raise limited by shoulder injury/pain  Psychiatric:        Mood and Affect: Mood normal.        Behavior: Behavior normal.     ED Results / Procedures / Treatments   Labs (all labs ordered are listed, but only abnormal results are displayed) Labs Reviewed  BASIC METABOLIC PANEL - Abnormal; Notable for the following components:      Result Value   CO2 21 (*)    Glucose, Bld 135 (*)    BUN 29 (*)    All other components within normal limits  CBC - Abnormal; Notable for the following components:   RDW 15.9 (*)    Platelets 99 (*)    All other components within normal limits  HEMOGLOBIN A1C - Abnormal; Notable for the following components:   Hgb A1c MFr Bld 6.6 (*)    All other components within normal limits  LIPID PANEL    EKG None  Radiology CT ANGIO HEAD NECK W WO CM  Result Date: 01/25/2023 CLINICAL DATA:  Stroke/TIA EXAM: CT ANGIOGRAPHY HEAD AND NECK WITH AND WITHOUT CONTRAST TECHNIQUE: Multidetector CT imaging of the head and neck was performed using the standard protocol during bolus administration of intravenous contrast. Multiplanar CT image reconstructions and MIPs were obtained to evaluate the vascular anatomy. Carotid stenosis measurements (when applicable) are obtained utilizing NASCET criteria, using the distal internal carotid diameter as the denominator. RADIATION DOSE REDUCTION: This exam was performed according to the departmental dose-optimization program which includes automated exposure control, adjustment of the mA and/or kV according to patient size and/or use of iterative reconstruction technique. CONTRAST:  75mL OMNIPAQUE IOHEXOL 350 MG/ML SOLN COMPARISON:  None Available. FINDINGS: CT HEAD FINDINGS Brain: There is no mass, hemorrhage or extra-axial collection. Generalized volume loss. Old left caudate head small vessel infarct. There is  hypoattenuation of the periventricular white matter, most commonly indicating chronic ischemic microangiopathy. Mega cisterna magna. Skull: The visualized skull base, calvarium and extracranial soft tissues are normal. Sinuses/Orbits: No fluid levels or advanced mucosal thickening of the visualized paranasal sinuses. No mastoid or middle ear effusion. The orbits are normal. CTA NECK FINDINGS SKELETON: There is no bony spinal canal stenosis. No lytic or blastic lesion. OTHER NECK: Normal pharynx, larynx and major salivary glands. No cervical lymphadenopathy. Unremarkable thyroid gland. UPPER CHEST: Multifocal biapical opacity, likely pulmonary edema AORTIC ARCH: There is calcific atherosclerosis of the aortic arch. Conventional 3 vessel aortic branching pattern. RIGHT CAROTID SYSTEM: No dissection, occlusion or aneurysm. There is mixed density atherosclerosis extending into the proximal ICA, resulting in less  than 50% stenosis. LEFT CAROTID SYSTEM: No dissection, occlusion or aneurysm. There is calcific atherosclerosis extending into the proximal ICA, resulting in less than 50% stenosis. VERTEBRAL ARTERIES: Left dominant configuration.There is no dissection, occlusion or flow-limiting stenosis to the skull base (V1-V3 segments). CTA HEAD FINDINGS POSTERIOR CIRCULATION: --Vertebral arteries: Normal V4 segments. --Inferior cerebellar arteries: Normal. --Basilar artery: Normal. --Superior cerebellar arteries: Normal. --Posterior cerebral arteries (PCA): Normal. ANTERIOR CIRCULATION: --Intracranial internal carotid arteries: Atherosclerotic calcification of the internal carotid arteries at the skull base without hemodynamically significant stenosis. --Anterior cerebral arteries (ACA): Normal. Both A1 segments are present. Patent anterior communicating artery (a-comm). --Middle cerebral arteries (MCA): Normal. VENOUS SINUSES: As permitted by contrast timing, patent. ANATOMIC VARIANTS: None Review of the MIP images  confirms the above findings. IMPRESSION: 1. No emergent large vessel occlusion or hemodynamically significant stenosis of the head or neck. 2. Bilateral carotid bifurcation atherosclerosis without hemodynamically significant stenosis. 3. Multifocal biapical opacity, likely pulmonary edema. Aortic Atherosclerosis (ICD10-I70.0). Electronically Signed   By: Deatra Robinson M.D.   On: 01/25/2023 19:57    Procedures Procedures    Medications Ordered in ED Medications  iohexol (OMNIPAQUE) 350 MG/ML injection 75 mL (75 mLs Intravenous Contrast Given 01/25/23 1901)    ED Course/ Medical Decision Making/ A&P Clinical Course as of 01/25/23 2017  Mon Jan 25, 2023  1703 I spoke to [facility] neurology who advised CTA head and neck for stroke evaluation - final recs pending. [MT]  2010 Patient's imaging was reviewed.  No emergent or high-grade stenosis noted.  His LDL is under goal of 70.  Therefore per neurology recommendations it would be reasonable to discharge him home on continued Xarelto, no antiplatelet medication or statin indicated at this time, and have him follow-up with a neurologist as an outpatient.  Both the patient and his daughter at the bedside are in agreement with this plan and comfortable and eager to go home. [MT]    Clinical Course User Index [MT] Abrham Maslowski, Kermit Balo, MD                                 Medical Decision Making Amount and/or Complexity of Data Reviewed Labs: ordered. Radiology: ordered.  Risk Prescription drug management.   This patient presents to the ED with concern for infarct at least 93 days old noted on MRI brain outpatient. This involves an extensive number of treatment options, and is a complaint that carries with it a high risk of complications and morbidity.    Co-morbidities that complicate the patient evaluation: hx of a fib on xarelto, stroke risk  Additional history obtained from the patient's daughter at bedside  External records from outside  source obtained and reviewed including outpatient MRI of the brain showing a small right acute pons infarct  I ordered and personally interpreted labs.  The pertinent results include: No emergent findings  I ordered imaging studies including CTA head and neck I independently visualized and interpreted imaging which showed no high-grade stenosis I agree with the radiologist interpretation  The patient was maintained on a cardiac monitor.    I have reviewed the patients home medicines and have made adjustments as needed  Test Considered: No indication for lumbar puncture at this time.  Doubt meningitis or demyelinating disease  I requested consultation with the neurology,  and discussed lab and imaging findings as well as pertinent plan - they recommend: CT angiogram imaging, checking lipid panel.  If no emergent findings patient could follow-up as an outpatient.  There is no urgent indication for repeat echocardiogram at this time as the patient has a known history of A-fib and is on Xarelto.  However, per my review of the record the patient does not appear to have had an echocardiogram since 2022, may benefit to have a repeat scan done as an outpatient, and this was advised in his discharge summary.  He can do this through a cardiologist or PCP.  After the interventions noted above, I reevaluated the patient and found that they have: stayed the same  Dispostion:  After consideration of the diagnostic results and the patients response to treatment, I feel that the patent would benefit from outpatient follow-up         Final Clinical Impression(s) / ED Diagnoses Final diagnoses:  Cerebrovascular accident (CVA), unspecified mechanism [facility])    Rx / DC Orders ED Discharge Orders     None         Terald Sleeper, MD 01/25/23 2017

## 2023-01-25 NOTE — Discharge Instructions (Addendum)
Your MRI scan performed earlier this week showed that you had a small stroke in the right inner part of your brain, called the pons.  You do not appear to have any significant symptoms from the stroke.  We did additional images including a CT angiogram in the ER to ensure that you did not have serious blockages in the arteries of your head and neck.  Based on this scan, our neurologist was comfortable sending you home.  You will continue taking the Xarelto blood thinner.  You will need to follow-up with a neurologist as an outpatient.    You should also call your primary care provider or cardiologist office to discuss an outpatient echocardiogram (ultrasound of your heart). If you have not had a recent echocardiogram in the past 12 months, you may benefit from a repeat scan. This can be ordered as an outpatient, typically by a cardiology office, but sometimes by a primary care provider as well.  Please be aware, if you do develop any symptoms of a stroke, including speech difficulty, vision loss, numbness or weakness of the arms or legs, or sudden balance difficulty, or sudden severe headache, please call 911 or return to the hospital as soon as possible.

## 2023-01-25 NOTE — Telephone Encounter (Signed)
This nurse reached out to this patient per provider, spoke with patients daughter, Vincent Velez,  Made her aware that provider received a call from Radiology about patients MR of brain.  This nurse was advised to let patient know that she needs to go to [facility] due to scan showing possible stroke in the hemi pons.  Daughter acknowledged understanding.  No further questions or concerns noted at this time.

## 2023-01-26 ENCOUNTER — Inpatient Hospital Stay: Payer: Medicare Other

## 2023-01-26 ENCOUNTER — Inpatient Hospital Stay: Payer: Medicare Other | Admitting: Physician Assistant

## 2023-01-26 NOTE — Progress Notes (Signed)
Patient has phone visit on 8/28 at 1015.  Please call daughters phone at [phone] for phone visit.

## 2023-01-27 ENCOUNTER — Inpatient Hospital Stay (HOSPITAL_BASED_OUTPATIENT_CLINIC_OR_DEPARTMENT_OTHER): Payer: Medicare Other | Admitting: Internal Medicine

## 2023-01-27 ENCOUNTER — Other Ambulatory Visit: Payer: Medicare Other

## 2023-01-27 ENCOUNTER — Ambulatory Visit: Payer: Medicare Other | Admitting: Physician Assistant

## 2023-01-27 DIAGNOSIS — C3490 Malignant neoplasm of unspecified part of unspecified bronchus or lung: Secondary | ICD-10-CM | POA: Diagnosis not present

## 2023-01-27 NOTE — Progress Notes (Signed)
[facility] [phone]   [phone]  PROGRESS NOTE FOR TELEMEDICINE VISITS  Vincent Dills, Vincent Velez [address] [place] [address]  I connected withNAME@ on 01/27/23 at 10:15 AM EDT by telephone visit and verified that I am speaking with the correct person using two identifiers.   I discussed the limitations, risks, security and privacy concerns of performing an evaluation and management service by telemedicine and the availability of in-person appointments. I also discussed with the patient that there may be a patient responsible charge related to this service. The patient expressed understanding and agreed to proceed.  Other persons participating in the visit and their role in the encounter:  Daughter  Patient's location:  Home Provider's location: [facility]  DIAGNOSIS: Recurrent/metastatic non-small cell lung cancer, adenocarcinoma initially diagnosed as a stage Ib (T2 a, N0, M0) adenocarcinoma in September 2020 status post wedge resection of the left upper lobe nodule.  The patient has disease recurrence and September 2022 with metastatic mediastinal lymphadenopathy as well as left hilar and left pleural metastatic disease    PD-L1 expression was negative.   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  First dose April 30, 2021. Status post 2 cycles. Discontinued due to molecular studies positive for EGFR mutation   CURRENT THERAPY: Tarceva 150 mg p.o. daily started June 28, 2021.  Status post 2 month of treatment.  His dose was changed to Tarceva 100 mg p.o. daily on 09/04/2021  secondary to intolerance and frequent skin rash.  Status post 16 months of treatment.   INTERVAL HISTORY: Vincent Velez 82 y.o. male has a telephone virtual visit with me today for evaluation and discussion of his recent scan results.  The patient is feeling fine today with no concerning complaints except for mild fatigue and occasional dizzy spells.  He complains of intermittent left-sided chest pain but no shortness of breath, cough or hemoptysis.  He has no nausea, vomiting, abdominal pain but has few episodes of diarrhea every few days with no constipation, melena or hematochezia.  He has no recent weight loss or night sweats.  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  He was evaluated at the emergency department and currently monitored with no need for admission.  The patient had repeat CT scan of the chest, abdomen and pelvis performed recently and we are having the visit for evaluation and discussion of his imaging studies and treatment options.  MEDICAL HISTORY: Past Medical History:  Diagnosis Date   AAA (abdominal aortic aneurysm) (HCC)    Anxiety    Arthritis    Asthma    when younger   Atrial fibrillation (HCC)    CAD (coronary artery disease)    Cataract    removed both   COPD (chronic obstructive pulmonary disease) (HCC)    Diabetes mellitus    Type II   Dysrhythmia    afib   Esophageal reflux    Family history of malignant neoplasm of gastrointestinal tract    Heart murmur    Hiatal hernia    Hyperlipemia  Hypertension    Lung nodule    a. PET scan highly suspicious for malignancy. Bronch with biopsy to be done after TAVR   Malignant neoplasm of prostate [facility])    prostate    Peripheral vascular disease (HCC)    Pneumonia    Stricture and stenosis of esophagus     ALLERGIES:  is allergic to macrolides and ketolides.  MEDICATIONS:  Current Outpatient Medications  Medication Sig Dispense Refill   ACCU-CHEK  GUIDE test strip USE TO CHECK YOUR BLOOD SUGAR ONCE DAILY     acetaminophen (TYLENOL) 500 MG tablet Take 2 tablets (1,000 mg total) by mouth every 6 (six) hours as needed for mild pain or fever. 30 tablet 0   diltiazem (CARDIZEM SR) 60 MG 12 hr capsule Take 1 capsule (60 mg total) by mouth every 12 (twelve) hours. 60 capsule 7   dorzolamide-timolol (COSOPT) 22.3-6.8 MG/ML ophthalmic solution Place 1 drop into both eyes in the morning, at noon, and at bedtime.     erlotinib (TARCEVA) 100 MG tablet Take 1 tablet (100 mg total) by mouth daily. Take on an empty stomach 1 hour before meals or 2 hours after. 30 tablet 3   glimepiride (AMARYL) 2 MG tablet Take 2 mg by mouth daily with breakfast.     JARDIANCE 25 MG TABS tablet Take 25 mg by mouth daily.     metFORMIN (GLUCOPHAGE-XR) 500 MG 24 hr tablet Take 1,000 mg by mouth every evening.      methylPREDNISolone (MEDROL DOSEPAK) 4 MG TBPK tablet Use as instructed 21 tablet 0   metoprolol succinate (TOPROL-XL) 25 MG 24 hr tablet Take 50 mg by mouth daily.     mirtazapine (REMERON) 15 MG tablet Take 1 tablet (15 mg total) by mouth at bedtime. 30 tablet 2   Multiple Vitamins-Minerals (MULTIVITAMIN WITH MINERALS) tablet Take 1 tablet by mouth daily. (Patient not taking: Reported on 08/24/2022)     Multiple Vitamins-Minerals (ZINC PO) Take 1 tablet by mouth daily. (Patient not taking: Reported on 08/24/2022)     prochlorperazine (COMPAZINE) 10 MG tablet Take 1 tablet (10 mg total) by mouth every 6 (six) hours as needed for nausea or vomiting. 30 tablet 0   simvastatin (ZOCOR) 40 MG tablet Take 40 mg by mouth at bedtime.     XARELTO 20 MG TABS tablet TAKE 1 TABLET BY MOUTH  DAILY WITH SUPPER 90 tablet 1   No current facility-administered medications for this visit.    SURGICAL HISTORY:  Past Surgical History:  Procedure Laterality Date   ABDOMINAL AORTA STENT     CARDIAC CATHETERIZATION     CARDIOVERSION N/A 12/04/2015   Procedure: CARDIOVERSION;  Surgeon:  Thurmon Fair, Vincent Velez;  Location: [facility] ENDOSCOPY;  Service: Cardiovascular;  Laterality: N/A;   COLONOSCOPY     FINGER SURGERY Right    middle finger- amputation   INSERTION PROSTATE RADIATION SEED     MEDIASTINOSCOPY N/A 01/06/2019   Procedure: MEDIASTINOSCOPY WITH BIOPIES;  Surgeon: Alleen Borne, Vincent Velez;  Location: [facility] OR;  Service: Thoracic;  Laterality: N/A;   RIGHT/LEFT HEART CATH AND CORONARY ANGIOGRAPHY N/A 10/26/2018   Procedure: RIGHT/LEFT HEART CATH AND CORONARY ANGIOGRAPHY;  Surgeon: Kathleene Hazel, Vincent Velez;  Location: [facility] INVASIVE CV LAB;  Service: Cardiovascular;  Laterality: N/A;   TEE WITHOUT CARDIOVERSION N/A 12/06/2018   Procedure: TRANSESOPHAGEAL ECHOCARDIOGRAM (TEE);  Surgeon: Kathleene Hazel, Vincent Velez;  Location: [facility] INVASIVE CV LAB;  Service: Open Heart Surgery;  Laterality: N/A;   TRANSCATHETER AORTIC VALVE REPLACEMENT,  TRANSFEMORAL N/A 12/06/2018   Procedure: TRANSCATHETER AORTIC VALVE REPLACEMENT, TRANSFEMORAL;  Surgeon: Kathleene Hazel, Vincent Velez;  Location: [facility] INVASIVE CV LAB;  Service: Open Heart Surgery;  Laterality: N/A;   UPPER GASTROINTESTINAL ENDOSCOPY     VIDEO ASSISTED THORACOSCOPY (VATS)/THOROCOTOMY Left 02/02/2019   Procedure: VIDEO ASSISTED THORACOSCOPY (VATS)/THOROCOTOMY;  Surgeon: Alleen Borne, Vincent Velez;  Location: [facility] OR;  Service: Thoracic;  Laterality: Left;   VIDEO BRONCHOSCOPY N/A 01/06/2019   Procedure: VIDEO BRONCHOSCOPY;  Surgeon: Alleen Borne, Vincent Velez;  Location: [facility] OR;  Service: Thoracic;  Laterality: N/A;   VIDEO BRONCHOSCOPY WITH ENDOBRONCHIAL ULTRASOUND N/A 03/07/2021   Procedure: VIDEO BRONCHOSCOPY WITH ENDOBRONCHIAL ULTRASOUND;  Surgeon: Loreli Slot, Vincent Velez;  Location: [facility] OR;  Service: Thoracic;  Laterality: N/A;   WEDGE RESECTION Left 02/02/2019   Procedure: LUNG RESECTION;  Surgeon: Alleen Borne, Vincent Velez;  Location: [facility] OR;  Service: Thoracic;  Laterality: Left;    REVIEW OF SYSTEMS:  Constitutional: positive for fatigue Eyes: negative Ears, nose, mouth,  throat, and face: negative Respiratory: positive for pleurisy/chest pain Cardiovascular: negative Gastrointestinal: positive for diarrhea Genitourinary:negative Integument/breast: negative Hematologic/lymphatic: negative Musculoskeletal:negative Neurological: negative Behavioral/Psych: negative Endocrine: negative Allergic/Immunologic: negative    LABORATORY DATA: Lab Results  Component Value Date   WBC 7.4 01/25/2023   HGB 13.1 01/25/2023   HCT 40.3 01/25/2023   MCV 88.6 01/25/2023   PLT 99 (L) 01/25/2023      Chemistry      Component Value Date/Time   NA 135 01/25/2023 1642   NA 138 12/06/2019 1438   K 4.5 01/25/2023 1642   CL 100 01/25/2023 1642   CO2 21 (L) 01/25/2023 1642   BUN 29 (H) 01/25/2023 1642   BUN 14 12/06/2019 1438   CREATININE 0.91 01/25/2023 1642   CREATININE 0.83 01/13/2023 1433   CREATININE 0.81 11/29/2015 1335      Component Value Date/Time   CALCIUM 9.4 01/25/2023 1642   ALKPHOS 89 01/13/2023 1433   AST 24 01/13/2023 1433   ALT 15 01/13/2023 1433   BILITOT 0.8 01/13/2023 1433       RADIOGRAPHIC STUDIES: CT ANGIO HEAD NECK W WO CM  Result Date: 01/25/2023 CLINICAL DATA:  Stroke/TIA EXAM: CT ANGIOGRAPHY HEAD AND NECK WITH AND WITHOUT CONTRAST TECHNIQUE: Multidetector CT imaging of the head and neck was performed using the standard protocol during bolus administration of intravenous contrast. Multiplanar CT image reconstructions and MIPs were obtained to evaluate the vascular anatomy. Carotid stenosis measurements (when applicable) are obtained utilizing NASCET criteria, using the distal internal carotid diameter as the denominator. RADIATION DOSE REDUCTION: This exam was performed according to the departmental dose-optimization program which includes automated exposure control, adjustment of the mA and/or kV according to patient size and/or use of iterative reconstruction technique. CONTRAST:  75mL OMNIPAQUE IOHEXOL 350 MG/ML SOLN COMPARISON:   None Available. FINDINGS: CT HEAD FINDINGS Brain: There is no mass, hemorrhage or extra-axial collection. Generalized volume loss. Old left caudate head small vessel infarct. There is hypoattenuation of the periventricular white matter, most commonly indicating chronic ischemic microangiopathy. Mega cisterna magna. Skull: The visualized skull base, calvarium and extracranial soft tissues are normal. Sinuses/Orbits: No fluid levels or advanced mucosal thickening of the visualized paranasal sinuses. No mastoid or middle ear effusion. The orbits are normal. CTA NECK FINDINGS SKELETON: There is no bony spinal canal stenosis. No lytic or blastic lesion. OTHER NECK: Normal pharynx, larynx and major salivary glands. No cervical lymphadenopathy. Unremarkable thyroid gland. UPPER CHEST: Multifocal biapical opacity, likely pulmonary edema  AORTIC ARCH: There is calcific atherosclerosis of the aortic arch. Conventional 3 vessel aortic branching pattern. RIGHT CAROTID SYSTEM: No dissection, occlusion or aneurysm. There is mixed density atherosclerosis extending into the proximal ICA, resulting in less than 50% stenosis. LEFT CAROTID SYSTEM: No dissection, occlusion or aneurysm. There is calcific atherosclerosis extending into the proximal ICA, resulting in less than 50% stenosis. VERTEBRAL ARTERIES: Left dominant configuration.There is no dissection, occlusion or flow-limiting stenosis to the skull base (V1-V3 segments). CTA HEAD FINDINGS POSTERIOR CIRCULATION: --Vertebral arteries: Normal V4 segments. --Inferior cerebellar arteries: Normal. --Basilar artery: Normal. --Superior cerebellar arteries: Normal. --Posterior cerebral arteries (PCA): Normal. ANTERIOR CIRCULATION: --Intracranial internal carotid arteries: Atherosclerotic calcification of the internal carotid arteries at the skull base without hemodynamically significant stenosis. --Anterior cerebral arteries (ACA): Normal. Both A1 segments are present. Patent anterior  communicating artery (a-comm). --Middle cerebral arteries (MCA): Normal. VENOUS SINUSES: As permitted by contrast timing, patent. ANATOMIC VARIANTS: None Review of the MIP images confirms the above findings. IMPRESSION: 1. No emergent large vessel occlusion or hemodynamically significant stenosis of the head or neck. 2. Bilateral carotid bifurcation atherosclerosis without hemodynamically significant stenosis. 3. Multifocal biapical opacity, likely pulmonary edema. Aortic Atherosclerosis (ICD10-I70.0). Electronically Signed   By: Deatra Robinson M.D.   On: 01/25/2023 19:57   MR Brain W Wo Contrast  Result Date: 01/25/2023 CLINICAL DATA:  Metastatic disease evaluation Lung cancer stage IV. Worsening balance and dizziness. Assess for metastatic disease to the brain EXAM: MRI HEAD WITHOUT AND WITH CONTRAST TECHNIQUE: Multiplanar, multiecho pulse sequences of the brain and surrounding structures were obtained without and with intravenous contrast. 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 Focus of microhemorrhage in the left frontal lobe (series 10, image 37, new from prior exam. No hydrocephalus. No extra-axial fluid collection. No contrast-enhancing lesions visualized. Sequela of moderate chronic microvascular ischemic change. There are chronic infarcts in the bilateral corona radiata in the right parietal lobe. Mega cisterna magna. Vascular: Normal flow voids. Skull and upper cervical spine: Normal marrow signal. Mild moderate spinal canal narrowing at C3-C4 secondary to degenerative disc bulge. Sinuses/Orbits: No middle ear or mastoid effusion. Paranasal sinuses notable for mucosal thickening in the bilateral ethmoid sinuses. Bilateral lens replacement. Orbits are otherwise unremarkable. Other: None. IMPRESSION:  1. 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 2. No evidence of intracranial metastatic disease. These results will be called to the ordering clinician or representative by the Radiologist Assistant, and communication documented in the PACS or [employer]. Electronically Signed   By: Lorenza Cambridge M.D.   On: 01/25/2023 12:49   CT CHEST ABDOMEN PELVIS W CONTRAST  Result Date: 01/24/2023 CLINICAL DATA:  Metastatic lung cancer restaging, shortness of breath * Tracking Code: BO * EXAM: CT CHEST, ABDOMEN, AND PELVIS WITH CONTRAST TECHNIQUE: Multidetector CT imaging of the chest, abdomen and pelvis was performed following the standard protocol during bolus administration of intravenous contrast. RADIATION DOSE REDUCTION: This exam was performed according to the departmental dose-optimization program which includes automated exposure control, adjustment of the mA and/or kV according to patient size and/or use of iterative reconstruction technique. CONTRAST:  OMNIPAQUE IOHEXOL 300 MG/ML  SOLN COMPARISON:  10/23/2022 FINDINGS: CT CHEST FINDINGS Cardiovascular: Aortic atherosclerosis. Aortic stent endograft. Cardiomegaly. Three-vessel coronary artery calcifications. No pericardial effusion. Mediastinum/Nodes: Unchanged prominent mediastinal and hilar lymph nodes, largest  pretracheal node measuring 1.8 x 0.8 cm (series 2, image 22). Thyroid gland, trachea, and esophagus demonstrate no significant findings. Lungs/Pleura: Unchanged, moderate pulmonary fibrosis in a pattern with apical to basal gradient, featuring irregular peripheral interstitial opacity, septal thickening, traction bronchiectasis, and subpleural bronchiolectasis without clear evidence of honeycombing. Significant interval increase in treated pleural thickening and nodularity about the left hemithorax, index nodule about the peripheral left upper lobe measuring 1.1 x 0.7 cm, previously no greater than 0.7 cm (series 7, image 64). Additional index  nodule of the anterior left lower lobe measures 0.9 x 0.5 cm, previously no greater than 0.6 cm (series 7, image 104). No pleural effusion or pneumothorax. Musculoskeletal: No chest wall abnormality. No acute osseous findings. CT ABDOMEN PELVIS FINDINGS Hepatobiliary: No solid liver abnormality is seen. No gallstones, gallbladder wall thickening, or biliary dilatation. Pancreas: Unremarkable. No pancreatic ductal dilatation or surrounding inflammatory changes. Spleen: Normal in size without significant abnormality. Adrenals/Urinary Tract: Unchanged left adrenal nodule measuring 1.7 x 1.7 cm (series 2, image 55). Normal right adrenal. Kidneys are normal, without renal calculi, solid lesion, or hydronephrosis. Bladder is unremarkable. Stomach/Bowel: Stomach is within normal limits. Appendix appears normal. No evidence of bowel wall thickening, distention, or inflammatory changes. Vascular/Lymphatic: Aortic atherosclerosis. Unchanged aortobiiliac stent endograft repair, excluded aneurysm sac measuring 4.3 x 3.7 cm (series 2, image 80). No enlarged abdominal or pelvic lymph nodes. Reproductive: Prostate brachytherapy. Other: No abdominal wall hernia or abnormality. No ascites. Musculoskeletal: No acute osseous findings. IMPRESSION: 1. Significant interval increase in treated pleural thickening and nodularity about the left hemithorax, consistent with recurrent pleural metastatic disease. 2. Unchanged prominent mediastinal and hilar lymph nodes. 3. Unchanged, moderate underlying pulmonary fibrosis in a pattern with apical to basal gradient, featuring irregular peripheral interstitial opacity, septal thickening, traction bronchiectasis, and subpleural bronchiolectasis without clear evidence of honeycombing. 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 4. Unchanged left adrenal nodule, most likely a benign incidental adenoma. Continued attention on follow-up. 5. Unchanged aortobiiliac stent endograft  repair, excluded aneurysm sac measuring 4.3 x 3.7 cm. 6. Coronary artery disease. Status post aortic valve stent endograft repair. Aortic Atherosclerosis (ICD10-I70.0). Electronically Signed   By: Jearld Lesch M.D.   On: 01/24/2023 21:52    ASSESSMENT AND PLAN:  This is a very pleasant 82 years old white male recently diagnosed with recurrent/metastatic non-small cell lung cancer, adenocarcinoma that was initially diagnosed as stage Ib (T2 a, N0, M0) adenocarcinoma in September 2020 status post wedge resection of the left lower lobe lung nodule and he has recurrence in September 2022 presented with metastatic mediastinal lymphadenopathy in addition to left hilar and left pleural metastatic disease. The patient had molecular studies by foundation 1 but there was insufficient material for the molecular test.  His PD-L1 expression was negative.  I will ask the pathology department to send his original resected tumor in 2020 for molecular studies as it may have enough tissue for testing. The patient started systemic chemotherapy with carboplatin for AUC of 5, Alimta 500 Mg/M2 and Keytruda 200 Mg IV every 3 weeks on April 30, 2021.  Status post 2 cycles.  He had evidence for disease progression after the first 2 cycles of his treatment. He had repeat CT scan of the chest that showed progressive metastatic disease throughout the pleura of the left hemothorax with new right hilar lymphadenopathy and increasing prevascular lymphadenopathy.  His treatment with chemotherapy was discontinued. Molecular studies from his resected tumor in September 2020 showed positive EGFR mutation, exon  19. The patient started treatment with Tarceva 150 mg p.o. daily on June 28, 2021.  He was not a good candidate for treatment with Tagrisso because of prolonged QT interval and concern about arrhythmia with Tagrisso. He has been on treatment with Tarceva 100 mg p.o. daily status post 16 months. The patient has been tolerating  his treatment well with no concerning adverse effect except for the few episodes of diarrhea few times a week. He had repeat CT scan of the chest, abdomen and pelvis performed recently.  I personally and independently reviewed the scan images and discussed the result with the patient and his daughter. His scan showed increased thickening of the treated left sided hemithorax pleura with nodularity concerning for recurrent pleural metastasis but no other significant changes. I recommended for the patient to continue with current treatment with Tarceva at the same dose. I will arrange for him to have a PET scan for further evaluation of this dural thickening and to rule out disease recurrence or metastasis in that area. I will see him back for follow-up visit in 3 weeks for evaluation and discussion of his PET scan results and other treatment options if needed. The patient and his daughter were in agreement with the current plan. He was advised to call if he has any concerning symptoms in the interval. I discussed the assessment and treatment plan with the patient. The patient was provided an opportunity to ask questions and all were answered. The patient agreed with the plan and demonstrated an understanding of the instructions.   The patient was advised to call back or seek an in-person evaluation if the symptoms worsen or if the condition fails to improve as anticipated.  I provided 25 minutes of non face-to-face telephone visit time during this encounter, and > 50% was spent counseling as documented under my assessment & plan.  Vincent Matte, Vincent Velez 01/27/2023 9:30 AM  Disclaimer: This note was dictated with voice recognition software. Similar sounding words can inadvertently be transcribed and may not be corrected upon review.

## 2023-02-02 ENCOUNTER — Telehealth: Payer: Self-pay | Admitting: Internal Medicine

## 2023-02-02 NOTE — Telephone Encounter (Signed)
Called patient regarding upcoming September appointments, patient is notified.  

## 2023-02-05 ENCOUNTER — Other Ambulatory Visit: Payer: Self-pay | Admitting: Physician Assistant

## 2023-02-05 ENCOUNTER — Other Ambulatory Visit (HOSPITAL_COMMUNITY): Payer: Self-pay

## 2023-02-05 DIAGNOSIS — C3492 Malignant neoplasm of unspecified part of left bronchus or lung: Secondary | ICD-10-CM

## 2023-02-06 ENCOUNTER — Other Ambulatory Visit (HOSPITAL_COMMUNITY): Payer: Self-pay

## 2023-02-06 MED ORDER — ERLOTINIB HCL 100 MG PO TABS
100.0000 mg | ORAL_TABLET | Freq: Every day | ORAL | 3 refills | Status: DC
  Filled 2023-02-06 – 2023-02-08 (×2): qty 30, 30d supply, fill #0

## 2023-02-08 ENCOUNTER — Other Ambulatory Visit: Payer: Self-pay

## 2023-02-11 ENCOUNTER — Encounter (HOSPITAL_COMMUNITY): Payer: Medicare Other

## 2023-02-11 ENCOUNTER — Other Ambulatory Visit (HOSPITAL_COMMUNITY): Payer: Self-pay

## 2023-02-16 ENCOUNTER — Encounter: Payer: Self-pay | Admitting: Internal Medicine

## 2023-02-18 ENCOUNTER — Ambulatory Visit: Payer: Medicare Other | Admitting: Physician Assistant

## 2023-02-18 ENCOUNTER — Telehealth: Payer: Self-pay | Admitting: Medical Oncology

## 2023-02-18 ENCOUNTER — Encounter: Payer: Self-pay | Admitting: Physician Assistant

## 2023-02-18 NOTE — Telephone Encounter (Signed)
Dtr notifid of lab appt.

## 2023-02-18 NOTE — Progress Notes (Deleted)
[facility] OFFICE PROGRESS NOTE  [name], MD [address] [place] [address]  DIAGNOSIS: Recurrent/metastatic non-small cell lung cancer, adenocarcinoma initially diagnosed as a stage Ib (T2 a, N0, M0) adenocarcinoma in September 2020 status post wedge resection of the left upper lobe nodule.  The patient has disease recurrence and September 2022 with metastatic mediastinal lymphadenopathy as well as left hilar and left pleural metastatic disease    PD-L1 expression was negative.   Molecular Studies: Insufficient genetic material on guardant 360 and for foundation 1   Foundation one testing on original tumor from wedge resection: EGFR exon 19 deletion ([id number])    PRIOR THERAPY:  1) wedge resection of left upper lobe nodule in September 2020 2) Systemic chemotherapy with carboplatin for AUC of 5, Alimta 500 Mg/M2 and Keytruda 200 Mg IV every 3 weeks.  First dose April 30, 2021. Status post 2 cycles. Discontinued due to molecular studies positive for EGFR mutation  CURRENT THERAPY: Tarceva 150 mg p.o. daily started June 28, 2021.  Status post 2 month of treatment.  His dose was changed to Tarceva 100 mg p.o. daily on 09/04/2021 secondary to intolerance and frequent skin rash.  Status post ***  months of treatment   INTERVAL HISTORY: Vincent Velez 82 y.o. male returns to the clinic today for a follow-up visit accompanied by ***. The patient had a telephone visit with Dr. Arbutus Ped to review his restaging CT scan which showed increased thickening of the treated left sided hemithorax pleura with nodularity concerning for recurrent pleural metastasis but no other significant changes. Therefore, Dr. Arbutus Ped ordered a PET scan to further evaluate this. The scan was performed on 02/26/23.   Since last being seen, the patient is feeling ***.   His fatigue is ***. He had been struggling with some weight loss. He is on 3 supplemental protein drinks per  day. I had started him on Remeron when I saw him on 01/13/23. He is *** with this. His appetite is ***.   He denies any fevers, chills, or night sweats.   His breathing is ***. He has some dyspnea with activities which improves with rest. He has mild intermittent cough which produces clear phlegm. Denies hemoptysis. Denies recent chest pain. He has a history of enlarged heart on imaging studies and history of atrial fibrillation. I did encourage him to see his cardiologist for follow up at prior appointments, since he typically has bradycardia secondary to his coreg and metoprolol and see if he needs any dose adjustments as he also had been having dizzy spells. He had a brain MRI due to increased dizziness. This did not show any metastatic disease to the brain but a small punctate infarct. He was seen in the ER but no *** was recommended. He intermittently has nausea. He does not ***require his anti-emetic. He intermittently has diarrhea due to his tagrisso which resolves with imodium. He intermittently also has rashes from Tarceva for which he uses hydrocortisone cream. He was previously reporting epistaxis. He was referred to ENT. He has an appointment on ***.      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 Cardiomegaly on scans. Histoyr of valvular repalcement and atrial fibrillation. ENTreferral. Saline spray.   Dizzy spells. Left chest pain.     Left chest pain. Small acute infarc in pons. ER. No need for admission.   is scan showed increased thickening of the treated left sided hemithorax pleura with nodularity concerning for recurrent pleural metastasis  but no other significant changes. I recommended for the patient to continue with current treatment with Tarceva at the same dose. I will arrange for him to have a PET scan for further evaluation of this dural thickening and to rule out disease recurrence or metastasis in that area.  MEDICAL HISTORY: Past Medical History:  Diagnosis Date    AAA (abdominal aortic aneurysm) (HCC)    Anxiety    Arthritis    Asthma    when younger   Atrial fibrillation (HCC)    CAD (coronary artery disease)    Cataract    removed both   COPD (chronic obstructive pulmonary disease) (HCC)    Diabetes mellitus    Type II   Dysrhythmia    afib   Esophageal reflux    Family history of malignant neoplasm of gastrointestinal tract    Heart murmur    Hiatal hernia    Hyperlipemia    Hypertension    Lung nodule    a. PET scan highly suspicious for malignancy. Bronch with biopsy to be done after TAVR   Malignant neoplasm of prostate [facility])    prostate    Peripheral vascular disease (HCC)    Pneumonia    Stricture and stenosis of esophagus     ALLERGIES:  is allergic to macrolides and ketolides.  MEDICATIONS:  Current Outpatient Medications  Medication Sig Dispense Refill   ACCU-CHEK GUIDE test strip USE TO CHECK YOUR BLOOD SUGAR ONCE DAILY     acetaminophen (TYLENOL) 500 MG tablet Take 2 tablets (1,000 mg total) by mouth every 6 (six) hours as needed for mild pain or fever. 30 tablet 0   diltiazem (CARDIZEM SR) 60 MG 12 hr capsule Take 1 capsule (60 mg total) by mouth every 12 (twelve) hours. 60 capsule 7   dorzolamide-timolol (COSOPT) 22.3-6.8 MG/ML ophthalmic solution Place 1 drop into both eyes in the morning, at noon, and at bedtime.     erlotinib (TARCEVA) 100 MG tablet Take 1 tablet (100 mg total) by mouth daily. Take on an empty stomach 1 hour before meals or 2 hours after. 30 tablet 3   glimepiride (AMARYL) 2 MG tablet Take 2 mg by mouth daily with breakfast.     JARDIANCE 25 MG TABS tablet Take 25 mg by mouth daily.     metFORMIN (GLUCOPHAGE-XR) 500 MG 24 hr tablet Take 1,000 mg by mouth every evening.      methylPREDNISolone (MEDROL DOSEPAK) 4 MG TBPK tablet Use as instructed 21 tablet 0   metoprolol succinate (TOPROL-XL) 25 MG 24 hr tablet Take 50 mg by mouth daily.     mirtazapine (REMERON) 15 MG tablet Take 1 tablet (15 mg  total) by mouth at bedtime. 30 tablet 2   Multiple Vitamins-Minerals (MULTIVITAMIN WITH MINERALS) tablet Take 1 tablet by mouth daily. (Patient not taking: Reported on 08/24/2022)     Multiple Vitamins-Minerals (ZINC PO) Take 1 tablet by mouth daily. (Patient not taking: Reported on 08/24/2022)     prochlorperazine (COMPAZINE) 10 MG tablet Take 1 tablet (10 mg total) by mouth every 6 (six) hours as needed for nausea or vomiting. 30 tablet 0   simvastatin (ZOCOR) 40 MG tablet Take 40 mg by mouth at bedtime.     XARELTO 20 MG TABS tablet TAKE 1 TABLET BY MOUTH  DAILY WITH SUPPER 90 tablet 1   No current facility-administered medications for this visit.    SURGICAL HISTORY:  Past Surgical History:  Procedure Laterality Date   ABDOMINAL AORTA  STENT     CARDIAC CATHETERIZATION     CARDIOVERSION N/A 12/04/2015   Procedure: CARDIOVERSION;  Surgeon: Thurmon Fair, MD;  Location: [facility] ENDOSCOPY;  Service: Cardiovascular;  Laterality: N/A;   COLONOSCOPY     FINGER SURGERY Right    middle finger- amputation   INSERTION PROSTATE RADIATION SEED     MEDIASTINOSCOPY N/A 01/06/2019   Procedure: MEDIASTINOSCOPY WITH BIOPIES;  Surgeon: Alleen Borne, MD;  Location: [facility] OR;  Service: Thoracic;  Laterality: N/A;   RIGHT/LEFT HEART CATH AND CORONARY ANGIOGRAPHY N/A 10/26/2018   Procedure: RIGHT/LEFT HEART CATH AND CORONARY ANGIOGRAPHY;  Surgeon: Kathleene Hazel, MD;  Location: [facility] INVASIVE CV LAB;  Service: Cardiovascular;  Laterality: N/A;   TEE WITHOUT CARDIOVERSION N/A 12/06/2018   Procedure: TRANSESOPHAGEAL ECHOCARDIOGRAM (TEE);  Surgeon: Kathleene Hazel, MD;  Location[facility] INVASIVE CV LAB;  Service: Open Heart Surgery;  Laterality: N/A;   TRANSCATHETER AORTIC VALVE REPLACEMENT, TRANSFEMORAL N/A 12/06/2018   Procedure: TRANSCATHETER AORTIC VALVE REPLACEMENT, TRANSFEMORAL;  Surgeon: Kathleene Hazel, MD;  Location: [facility] INVASIVE CV LAB;  Service: Open Heart Surgery;  Laterality: N/A;   UPPER  GASTROINTESTINAL ENDOSCOPY     VIDEO ASSISTED THORACOSCOPY (VATS)/THOROCOTOMY Left 02/02/2019   Procedure: VIDEO ASSISTED THORACOSCOPY (VATS)/THOROCOTOMY;  Surgeon: Alleen Borne, MD;  Location: [facility] OR;  Service: Thoracic;  Laterality: Left;   VIDEO BRONCHOSCOPY N/A 01/06/2019   Procedure: VIDEO BRONCHOSCOPY;  Surgeon: Alleen Borne, MD;  Location: [facility] OR;  Service: Thoracic;  Laterality: N/A;   VIDEO BRONCHOSCOPY WITH ENDOBRONCHIAL ULTRASOUND N/A 03/07/2021   Procedure: VIDEO BRONCHOSCOPY WITH ENDOBRONCHIAL ULTRASOUND;  Surgeon: Loreli Slot, MD;  Location: [facility] OR;  Service: Thoracic;  Laterality: N/A;   WEDGE RESECTION Left 02/02/2019   Procedure: LUNG RESECTION;  Surgeon: Alleen Borne, MD;  Location: [facility] OR;  Service: Thoracic;  Laterality: Left;    REVIEW OF SYSTEMS:   Review of Systems  Constitutional: Negative for appetite change, chills, fatigue, fever and unexpected weight change.  HENT:   Negative for mouth sores, nosebleeds, sore throat and trouble swallowing.   Eyes: Negative for eye problems and icterus.  Respiratory: Negative for cough, hemoptysis, shortness of breath and wheezing.   Cardiovascular: Negative for chest pain and leg swelling.  Gastrointestinal: Negative for abdominal pain, constipation, diarrhea, nausea and vomiting.  Genitourinary: Negative for bladder incontinence, difficulty urinating, dysuria, frequency and hematuria.   Musculoskeletal: Negative for back pain, gait problem, neck pain and neck stiffness.  Skin: Negative for itching and rash.  Neurological: Negative for dizziness, extremity weakness, gait problem, headaches, light-headedness and seizures.  Hematological: Negative for adenopathy. Does not bruise/bleed easily.  Psychiatric/Behavioral: Negative for confusion, depression and sleep disturbance. The patient is not nervous/anxious.     PHYSICAL EXAMINATION:  There were no vitals taken for this visit.  ECOG PERFORMANCE STATUS: {CHL ONC ECOG  [id number]  Physical Exam  Constitutional: Oriented to person, place, and time and well-developed, well-nourished, and in no distress. No distress.  HENT:  Head: Normocephalic and atraumatic.  Mouth/Throat: Oropharynx is clear and moist. No oropharyngeal exudate.  Eyes: Conjunctivae are normal. Right eye exhibits no discharge. Left eye exhibits no discharge. No scleral icterus.  Neck: Normal range of motion. Neck supple.  Cardiovascular: Normal rate, regular rhythm, normal heart sounds and intact distal pulses.   Pulmonary/Chest: Effort normal and breath sounds normal. No respiratory distress. No wheezes. No rales.  Abdominal: Soft. Bowel sounds are normal. Exhibits no distension and no mass. There is no tenderness.  Musculoskeletal:  Normal range of motion. Exhibits no edema.  Lymphadenopathy:    No cervical adenopathy.  Neurological: Alert and oriented to person, place, and time. Exhibits normal muscle tone. Gait normal. Coordination normal.  Skin: Skin is warm and dry. No rash noted. Not diaphoretic. No erythema. No pallor.  Psychiatric: Mood, memory and judgment normal.  Vitals reviewed.  LABORATORY DATA: Lab Results  Component Value Date   WBC 7.4 01/25/2023   HGB 13.1 01/25/2023   HCT 40.3 01/25/2023   MCV 88.6 01/25/2023   PLT 99 (L) 01/25/2023      Chemistry      Component Value Date/Time   NA 135 01/25/2023 1642   NA 138 12/06/2019 1438   K 4.5 01/25/2023 1642   CL 100 01/25/2023 1642   CO2 21 (L) 01/25/2023 1642   BUN 29 (H) 01/25/2023 1642   BUN 14 12/06/2019 1438   CREATININE 0.91 01/25/2023 1642   CREATININE 0.83 01/13/2023 1433   CREATININE 0.81 11/29/2015 1335      Component Value Date/Time   CALCIUM 9.4 01/25/2023 1642   ALKPHOS 89 01/13/2023 1433   AST 24 01/13/2023 1433   ALT 15 01/13/2023 1433   BILITOT 0.8 01/13/2023 1433       RADIOGRAPHIC STUDIES:  CT ANGIO HEAD NECK W WO CM  Result Date: 01/25/2023 CLINICAL DATA:  Stroke/TIA EXAM: CT  ANGIOGRAPHY HEAD AND NECK WITH AND WITHOUT CONTRAST TECHNIQUE: Multidetector CT imaging of the head and neck was performed using the standard protocol during bolus administration of intravenous contrast. Multiplanar CT image reconstructions and MIPs were obtained to evaluate the vascular anatomy. Carotid stenosis measurements (when applicable) are obtained utilizing NASCET criteria, using the distal internal carotid diameter as the denominator. RADIATION DOSE REDUCTION: This exam was performed according to the departmental dose-optimization program which includes automated exposure control, adjustment of the mA and/or kV according to patient size and/or use of iterative reconstruction technique. CONTRAST:  75mL OMNIPAQUE IOHEXOL 350 MG/ML SOLN COMPARISON:  None Available. FINDINGS: CT HEAD FINDINGS Brain: There is no mass, hemorrhage or extra-axial collection. Generalized volume loss. Old left caudate head small vessel infarct. There is hypoattenuation of the periventricular white matter, most commonly indicating chronic ischemic microangiopathy. Mega cisterna magna. Skull: The visualized skull base, calvarium and extracranial soft tissues are normal. Sinuses/Orbits: No fluid levels or advanced mucosal thickening of the visualized paranasal sinuses. No mastoid or middle ear effusion. The orbits are normal. CTA NECK FINDINGS SKELETON: There is no bony spinal canal stenosis. No lytic or blastic lesion. OTHER NECK: Normal pharynx, larynx and major salivary glands. No cervical lymphadenopathy. Unremarkable thyroid gland. UPPER CHEST: Multifocal biapical opacity, likely pulmonary edema AORTIC ARCH: There is calcific atherosclerosis of the aortic arch. Conventional 3 vessel aortic branching pattern. RIGHT CAROTID SYSTEM: No dissection, occlusion or aneurysm. There is mixed density atherosclerosis extending into the proximal ICA, resulting in less than 50% stenosis. LEFT CAROTID SYSTEM: No dissection, occlusion or  aneurysm. There is calcific atherosclerosis extending into the proximal ICA, resulting in less than 50% stenosis. VERTEBRAL ARTERIES: Left dominant configuration.There is no dissection, occlusion or flow-limiting stenosis to the skull base (V1-V3 segments). CTA HEAD FINDINGS POSTERIOR CIRCULATION: --Vertebral arteries: Normal V4 segments. --Inferior cerebellar arteries: Normal. --Basilar artery: Normal. --Superior cerebellar arteries: Normal. --Posterior cerebral arteries (PCA): Normal. ANTERIOR CIRCULATION: --Intracranial internal carotid arteries: Atherosclerotic calcification of the internal carotid arteries at the skull base without hemodynamically significant stenosis. --Anterior cerebral arteries (ACA): Normal. Both A1 segments are present. Patent anterior communicating artery (a-comm). --  Middle cerebral arteries (MCA): Normal. VENOUS SINUSES: As permitted by contrast timing, patent. ANATOMIC VARIANTS: None Review of the MIP images confirms the above findings. IMPRESSION: 1. No emergent large vessel occlusion or hemodynamically significant stenosis of the head or neck. 2. Bilateral carotid bifurcation atherosclerosis without hemodynamically significant stenosis. 3. Multifocal biapical opacity, likely pulmonary edema. Aortic Atherosclerosis (ICD10-I70.0). Electronically Signed   By: Deatra Robinson M.D.   On: 01/25/2023 19:57   MR Brain W Wo Contrast  Result Date: 01/25/2023 CLINICAL DATA:  Metastatic disease evaluation Lung cancer stage IV. Worsening balance and dizziness. Assess for metastatic disease to the brain EXAM: MRI HEAD WITHOUT AND WITH CONTRAST TECHNIQUE: Multiplanar, multiecho pulse sequences of the brain and surrounding structures were obtained without and with intravenous contrast. 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 Focus of microhemorrhage in the left frontal lobe (series 10, image 37, new from prior exam. No hydrocephalus. No extra-axial fluid collection. No contrast-enhancing lesions visualized. Sequela of moderate chronic microvascular ischemic change. There are chronic infarcts in the bilateral corona radiata in the right parietal lobe. Mega cisterna magna. Vascular: Normal flow voids. Skull and upper cervical spine: Normal marrow signal. Mild moderate spinal canal narrowing at C3-C4 secondary to degenerative disc bulge. Sinuses/Orbits: No middle ear or mastoid effusion. Paranasal sinuses notable for mucosal thickening in the bilateral ethmoid sinuses. Bilateral lens replacement. Orbits are otherwise unremarkable. Other: None. IMPRESSION: 1. 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 2. No evidence of intracranial metastatic disease. These results will be called to the ordering clinician or representative by the Radiologist Assistant, and communication documented in the PACS or [employer]. Electronically Signed   By: Lorenza Cambridge M.D.   On: 01/25/2023 12:49   CT CHEST ABDOMEN PELVIS W CONTRAST  Result Date: 01/24/2023 CLINICAL DATA:  Metastatic lung cancer restaging, shortness of breath * Tracking Code: BO * EXAM: CT CHEST, ABDOMEN, AND PELVIS WITH CONTRAST TECHNIQUE: Multidetector CT imaging of the chest, abdomen and pelvis was performed following the standard protocol during bolus administration of intravenous contrast. RADIATION DOSE REDUCTION: This exam was performed according to the departmental dose-optimization program which includes automated exposure control, adjustment of the mA and/or kV according to patient size and/or use of iterative reconstruction technique. CONTRAST:  OMNIPAQUE IOHEXOL 300 MG/ML  SOLN COMPARISON:  10/23/2022 FINDINGS: CT CHEST FINDINGS Cardiovascular: Aortic atherosclerosis. Aortic stent endograft. Cardiomegaly. Three-vessel coronary artery  calcifications. No pericardial effusion. Mediastinum/Nodes: Unchanged prominent mediastinal and hilar lymph nodes, largest pretracheal node measuring 1.8 x 0.8 cm (series 2, image 22). Thyroid gland, trachea, and esophagus demonstrate no significant findings. Lungs/Pleura: Unchanged, moderate pulmonary fibrosis in a pattern with apical to basal gradient, featuring irregular peripheral interstitial opacity, septal thickening, traction bronchiectasis, and subpleural bronchiolectasis without clear evidence of honeycombing. Significant interval increase in treated pleural thickening and nodularity about the left hemithorax, index nodule about the peripheral left upper lobe measuring 1.1 x 0.7 cm, previously no greater than 0.7 cm (series 7, image 64). Additional index nodule of the anterior left lower lobe measures 0.9 x 0.5 cm, previously no greater than 0.6 cm (series 7, image 104). No pleural effusion or pneumothorax. Musculoskeletal: No chest wall abnormality. No acute osseous findings. CT ABDOMEN PELVIS FINDINGS Hepatobiliary: No solid liver abnormality is seen. No gallstones, gallbladder wall thickening, or biliary dilatation.  Pancreas: Unremarkable. No pancreatic ductal dilatation or surrounding inflammatory changes. Spleen: Normal in size without significant abnormality. Adrenals/Urinary Tract: Unchanged left adrenal nodule measuring 1.7 x 1.7 cm (series 2, image 55). Normal right adrenal. Kidneys are normal, without renal calculi, solid lesion, or hydronephrosis. Bladder is unremarkable. Stomach/Bowel: Stomach is within normal limits. Appendix appears normal. No evidence of bowel wall thickening, distention, or inflammatory changes. Vascular/Lymphatic: Aortic atherosclerosis. Unchanged aortobiiliac stent endograft repair, excluded aneurysm sac measuring 4.3 x 3.7 cm (series 2, image 80). No enlarged abdominal or pelvic lymph nodes. Reproductive: Prostate brachytherapy. Other: No abdominal wall hernia or  abnormality. No ascites. Musculoskeletal: No acute osseous findings. IMPRESSION: 1. Significant interval increase in treated pleural thickening and nodularity about the left hemithorax, consistent with recurrent pleural metastatic disease. 2. Unchanged prominent mediastinal and hilar lymph nodes. 3. Unchanged, moderate underlying pulmonary fibrosis in a pattern with apical to basal gradient, featuring irregular peripheral interstitial opacity, septal thickening, traction bronchiectasis, and subpleural bronchiolectasis without clear evidence of honeycombing. 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 4. Unchanged left adrenal nodule, most likely a benign incidental adenoma. Continued attention on follow-up. 5. Unchanged aortobiiliac stent endograft repair, excluded aneurysm sac measuring 4.3 x 3.7 cm. 6. Coronary artery disease. Status post aortic valve stent endograft repair. Aortic Atherosclerosis (ICD10-I70.0). Electronically Signed   By: Jearld Lesch M.D.   On: 01/24/2023 21:52     ASSESSMENT/PLAN:  No problem-specific Assessment & Plan notes found for this encounter.   No orders of the defined types were placed in this encounter.    I spent {CHL ONC TIME VISIT - [id number]} counseling the patient face to face. The total time spent in the appointment was {CHL ONC TIME VISIT - [id number]}.  Tyisha Cressy L Mollye Guinta, PA-C 02/18/23

## 2023-02-22 NOTE — Progress Notes (Unsigned)
[facility] OFFICE PROGRESS NOTE  [name], MD [address] [place] [address]  DIAGNOSIS: Recurrent/metastatic non-small cell lung cancer, adenocarcinoma initially diagnosed as a stage Ib (T2 a, N0, M0) adenocarcinoma in September 2020 status post wedge resection of the left upper lobe nodule.  The patient has disease recurrence and September 2022 with metastatic mediastinal lymphadenopathy as well as left hilar and left pleural metastatic disease    PD-L1 expression was negative.   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  First dose April 30, 2021. Status post 2 cycles. Discontinued due to molecular studies positive for EGFR mutation 3) Tarceva 150 mg p.o. daily started June 28, 2021.  Status post 2 month of treatment.  His dose was changed to Tarceva 100 mg p.o. daily on 09/04/2021 secondary to intolerance and frequent skin rash.  Status post  months of treatment    CURRENT THERAPY: Tarceva 150 mg p.o. daily started June 28, 2021.  Status post 2 month of treatment.  His dose was changed to Tarceva 100 mg p.o. daily on 09/04/2021 secondary to intolerance and frequent skin rash.  Status post 17.5  months of treatment   INTERVAL HISTORY: Vincent Velez 82 y.o. male returns to the clinic today for follow-up visit accompanied by his daughter.  The patient was last seen 1 month ago by Dr. Arbutus Ped on 01/27/2023. At that point in time, he had a restaging CT scan which showed increased thickening of the treated left sided hemithorax pleura with nodularity concerning for recurrent pleural metastasis but no other significant  changes. Therefore, Dr. Arbutus Ped recommended a PET to further evaluate this.   He had this performed on 02/26/23.   Overall, the patient is endorsing some decreased strength and stamina.  He notices that he tires quicker.  He had been losing weight but gained some weight back.  He has been drinking 3 protein supplemental drinks per day.  Denies any fever, chills, or night sweats.  He has some taste alterations without any evidence of thrush.  He has dyspnea on exertion at times.  He has an intermittent cough and chest congestion.  He denies any hemoptysis or chest pain.  Denies any vomiting but has diarrhea every other day about 5-6 times per day.  He takes Imodium as directed per package insert which does help somewhat but it takes a few doses for this to improve. Still has intermittent rashes with Tarceva for which he uses steroid cream.  At prior appointments he was endorsing epistaxis and he was referred to ENT.  ENT did call him to make an appointment but he had improvement in his nosebleeds and did not make the appointment.  However his nosebleeds did worsen somewhat recently.  He is here today for evaluation and to discuss his options.    MEDICAL HISTORY: Past Medical History:  Diagnosis Date   AAA (abdominal aortic aneurysm) (HCC)    Anxiety    Arthritis    Asthma    when younger   Atrial fibrillation (HCC)    CAD (coronary artery disease)    Cataract    removed both   COPD (chronic obstructive pulmonary  disease) (HCC)    Diabetes mellitus    Type II   Dysrhythmia    afib   Esophageal reflux    Family history of malignant neoplasm of gastrointestinal tract    Heart murmur    Hiatal hernia    Hyperlipemia    Hypertension    Lung nodule    a. PET scan highly suspicious for malignancy. Bronch with biopsy to be done after TAVR   Malignant neoplasm of prostate [facility])    prostate    Peripheral vascular disease (HCC)    Pneumonia    Stricture and stenosis of esophagus      ALLERGIES:  is allergic to macrolides and ketolides.  MEDICATIONS:  Current Outpatient Medications  Medication Sig Dispense Refill   ACCU-CHEK GUIDE test strip USE TO CHECK YOUR BLOOD SUGAR ONCE DAILY     acetaminophen (TYLENOL) 500 MG tablet Take 2 tablets (1,000 mg total) by mouth every 6 (six) hours as needed for mild pain or fever. 30 tablet 0   diltiazem (CARDIZEM SR) 60 MG 12 hr capsule Take 1 capsule (60 mg total) by mouth every 12 (twelve) hours. 60 capsule 7   dorzolamide-timolol (COSOPT) 22.3-6.8 MG/ML ophthalmic solution Place 1 drop into both eyes in the morning, at noon, and at bedtime.     erlotinib (TARCEVA) 100 MG tablet Take 1 tablet (100 mg total) by mouth daily. Take on an empty stomach 1 hour before meals or 2 hours after. 30 tablet 3   glimepiride (AMARYL) 2 MG tablet Take 2 mg by mouth daily with breakfast.     JARDIANCE 25 MG TABS tablet Take 25 mg by mouth daily.     metFORMIN (GLUCOPHAGE-XR) 500 MG 24 hr tablet Take 1,000 mg by mouth every evening.      methylPREDNISolone (MEDROL DOSEPAK) 4 MG TBPK tablet Use as instructed 21 tablet 0   metoprolol succinate (TOPROL-XL) 25 MG 24 hr tablet Take 50 mg by mouth daily.     mirtazapine (REMERON) 15 MG tablet Take 1 tablet (15 mg total) by mouth at bedtime. 30 tablet 2   Multiple Vitamins-Minerals (MULTIVITAMIN WITH MINERALS) tablet Take 1 tablet by mouth daily.     Multiple Vitamins-Minerals (ZINC PO) Take 1 tablet by mouth daily.     prochlorperazine (COMPAZINE) 10 MG tablet Take 1 tablet (10 mg total) by mouth every 6 (six) hours as needed for nausea or vomiting. 30 tablet 0   simvastatin (ZOCOR) 40 MG tablet Take 40 mg by mouth at bedtime.     XARELTO 20 MG TABS tablet TAKE 1 TABLET BY MOUTH  DAILY WITH SUPPER 90 tablet 1   No current facility-administered medications for this visit.    SURGICAL HISTORY:  Past Surgical History:  Procedure Laterality Date   ABDOMINAL AORTA STENT     CARDIAC CATHETERIZATION      CARDIOVERSION N/A 12/04/2015   Procedure: CARDIOVERSION;  Surgeon: Thurmon Fair, MD;  Location: [facility] ENDOSCOPY;  Service: Cardiovascular;  Laterality: N/A;   COLONOSCOPY     FINGER SURGERY Right    middle finger- amputation   INSERTION PROSTATE RADIATION SEED     MEDIASTINOSCOPY N/A 01/06/2019   Procedure: MEDIASTINOSCOPY WITH BIOPIES;  Surgeon: Alleen Borne, MD;  Location: [facility] OR;  Service: Thoracic;  Laterality: N/A;   RIGHT/LEFT HEART CATH AND CORONARY ANGIOGRAPHY N/A 10/26/2018   Procedure: RIGHT/LEFT HEART CATH AND CORONARY ANGIOGRAPHY;  Surgeon: Kathleene Hazel, MD;  Location: [facility] INVASIVE CV LAB;  Service: Cardiovascular;  Laterality: N/A;  TEE WITHOUT CARDIOVERSION N/A 12/06/2018   Procedure: TRANSESOPHAGEAL ECHOCARDIOGRAM (TEE);  Surgeon: Kathleene Hazel, MD;  Location: [facility] INVASIVE CV LAB;  Service: Open Heart Surgery;  Laterality: N/A;   TRANSCATHETER AORTIC VALVE REPLACEMENT, TRANSFEMORAL N/A 12/06/2018   Procedure: TRANSCATHETER AORTIC VALVE REPLACEMENT, TRANSFEMORAL;  Surgeon: Kathleene Hazel, MD;  Location: [facility] INVASIVE CV LAB;  Service: Open Heart Surgery;  Laterality: N/A;   UPPER GASTROINTESTINAL ENDOSCOPY     VIDEO ASSISTED THORACOSCOPY (VATS)/THOROCOTOMY Left 02/02/2019   Procedure: VIDEO ASSISTED THORACOSCOPY (VATS)/THOROCOTOMY;  Surgeon: Alleen Borne, MD;  Location: [facility] OR;  Service: Thoracic;  Laterality: Left;   VIDEO BRONCHOSCOPY N/A 01/06/2019   Procedure: VIDEO BRONCHOSCOPY;  Surgeon: Alleen Borne, MD;  Location: [facility] OR;  Service: Thoracic;  Laterality: N/A;   VIDEO BRONCHOSCOPY WITH ENDOBRONCHIAL ULTRASOUND N/A 03/07/2021   Procedure: VIDEO BRONCHOSCOPY WITH ENDOBRONCHIAL ULTRASOUND;  Surgeon: Loreli Slot, MD;  [facility] OR;  Service: Thoracic;  Laterality: N/A;   WEDGE RESECTION Left 02/02/2019   Procedure: LUNG RESECTION;  Surgeon: Alleen Borne, MD;  Location: [facility] OR;  Service: Thoracic;  Laterality: Left;    REVIEW OF SYSTEMS:    Constitutional: Positive for fatigue and diminished appetite.  Negative for  chills and fever.  HENT: Positive for epistaxis. Positive for taste alterations. Negative for mouth sores, sore throat and trouble swallowing.   Eyes: Negative for eye problems and icterus.  Respiratory: Positive for dyspnea on exertion and cough (progressively worsening). Negative for hemoptysis and wheezing.   Cardiovascular: Negative for chest pain (none at this time) and leg swelling.  Gastrointestinal: Positive for intermittent diarrhea.. Positive for mild intermittent nausea. Negative for vomiting.  Genitourinary: Negative for bladder incontinence, difficulty urinating, dysuria, frequency and hematuria.   Musculoskeletal: Negative for back pain, gait problem, neck pain and neck stiffness.  Skin: Positive for intermittent itching and rash.  Neurological: Negative for  extremity weakness, gait problem, headaches, and seizures.  Hematological: Negative for adenopathy. Does not bruise/bleed easily.  Psychiatric/Behavioral: Negative for confusion, depression and sleep disturbance. The patient is not nervous/anxious.       PHYSICAL EXAMINATION:  Blood pressure (!) 120/58, pulse 60, temperature 97.7 F (36.5 C), temperature source Oral, resp. rate 15, weight 148 lb 12.8 oz (67.5 kg), SpO2 92%.  ECOG PERFORMANCE STATUS: 1  Physical Exam  Constitutional: Oriented to person, place, and time and well-developed, well-nourished, and in no distress.  HENT:  Head: Normocephalic and atraumatic.  Mouth/Throat: Oropharynx is clear and moist. No oropharyngeal exudate.  Eyes: Conjunctivae are normal. Right eye exhibits no discharge. Left eye exhibits no discharge. No scleral icterus.  Neck: Normal range of motion. Neck supple.  Cardiovascular: Normal rate, regular rhythm, normal heart sounds and intact distal pulses.   Pulmonary/Chest: Effort normal and breath sounds normal. No respiratory distress. No wheezes. No rales.   Abdominal: Soft. Bowel sounds are normal. Exhibits no distension and no mass. There is no tenderness.  Musculoskeletal: Normal range of motion. Exhibits no edema.  Lymphadenopathy:    No cervical adenopathy.  Neurological: Alert and oriented to person, place, and time. Exhibits normal muscle tone. Gait normal. Coordination normal.  Skin: Skin is warm and dry. No rash noted. Not diaphoretic. No erythema. No pallor.  Psychiatric: Mood, memory and judgment normal.  Vitals reviewed.  LABORATORY DATA: Lab Results  Component Value Date   WBC 6.0 03/03/2023   HGB 13.3 03/03/2023   HCT 39.5 03/03/2023   MCV 91.6 03/03/2023   PLT 87 (L) 03/03/2023  Chemistry      Component Value Date/Time   NA 134 (L) 03/03/2023 1451   NA 138 12/06/2019 1438   K 4.2 03/03/2023 1451   CL 102 03/03/2023 1451   CO2 25 03/03/2023 1451   BUN 28 (H) 03/03/2023 1451   BUN 14 12/06/2019 1438   CREATININE 1.08 03/03/2023 1451   CREATININE 0.81 11/29/2015 1335      Component Value Date/Time   CALCIUM 9.8 03/03/2023 1451   ALKPHOS 97 03/03/2023 1451   AST 24 03/03/2023 1451   ALT 16 03/03/2023 1451   BILITOT 0.7 03/03/2023 1451       RADIOGRAPHIC STUDIES:  NM PET Image Restage (PS) Skull Base to Thigh (F-18 FDG)  Result Date: 03/03/2023 CLINICAL DATA:  Subsequent treatment strategy for non-small cell lung cancer. EXAM: NUCLEAR MEDICINE PET SKULL BASE TO THIGH TECHNIQUE: 7.74 mCi F-18 FDG was injected intravenously. Full-ring PET imaging was performed from the skull base to thigh after the radiotracer. CT data was obtained and used for attenuation correction and anatomic localization. Fasting blood glucose: 103 mg/dl COMPARISON:  PET-CT 16/03/9603. CT of the chest, abdomen and pelvis 01/20/2023. FINDINGS: Mediastinal blood pool activity: SUV max 1.6 NECK: No hypermetabolic cervical lymph nodes are identified.Fairly symmetric activity within the lymphoid tissue of Waldeyer's ring is within physiologic  limits. No suspicious activity identified within the pharyngeal mucosal space. Incidental CT findings: Bilateral carotid atherosclerosis. CHEST: There are small hypermetabolic left hilar and subcarinal lymph nodes. 1.3 cm subcarinal node on image 73/4 has an SUV max of 4.1. The individual hypermetabolic left hilar nodes are not well delineated on the CT images, although there is a node along the inferior aspect of the left hilum with an SUV max of 4.7. The increased asymmetric left-sided pleural thickening seen on recent CT is associated with mild hypermetabolic activity, greatest laterally demonstrating an SUV max of 4.1. Some of the hilar activity on the left may be due to adjacent pleural disease. No dominant lung mass or suspicious central hypermetabolic pulmonary activity. Incidental CT findings: Severe pulmonary fibrosis with probable superimposed dependent atelectasis in both lungs. Diffuse atherosclerosis of the aorta, great vessels and coronary arteries post TAVR procedure. The heart is mildly enlarged. ABDOMEN/PELVIS: There is no hypermetabolic activity within the liver, adrenal glands, spleen or pancreas. There is no hypermetabolic nodal activity in the abdomen or pelvis. Incidental CT findings: Unchanged 1.8 cm left adrenal nodule on image 107/4. 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 Diffuse aortic and branch vessel atherosclerosis post aorto bi-iliac endograft stenting. Prostate brachytherapy seeds noted. SKELETON: There is no hypermetabolic activity to suggest osseous metastatic disease. Incidental CT findings: Mild spondylosis. Unchanged coarse calcification in the subcutaneous fat of the inferior right buttocks, without hypermetabolic activity. IMPRESSION: 1. The asymmetric left-sided pleural thickening seen on recent CT is associated with mild  hypermetabolic activity, suspicious for pleural metastatic disease. 2. Mildly hypermetabolic subcarinal and left hilar lymph nodes are nonspecific and could be reactive or neoplastic. 3. No evidence of distant metastatic disease. 4. 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 5.  Aortic Atherosclerosis (ICD10-I70.0). Electronically Signed   By: Carey Bullocks M.D.   On: 03/03/2023 09:15     ASSESSMENT/PLAN:  This is a very pleasant 82 year old Caucasian male diagnosed with recurrent/metastatic non-small cell lung cancer, adenocarcinoma that was initially diagnosed as stage Ib (  T2 a, N0, M0) adenocarcinoma in September 2020 status post wedge resection of the left lower lobe lung nodule and he had recurrence in September 2022 presented with metastatic mediastinal lymphadenopathy in addition to left hilar and left pleural metastatic disease.  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    He underwent 2 cycles of systemic palliative chemotherapy with carboplatin AUC of 5 and Alimta 500 mg per metered squared and Keytruda 200 mg IV every 3 weeks.  He received 2 cycles.  This was discontinued due to receiving the results of his molecular studies which show EGFR mutation.   He was started on treatment with Tarceva 150 mg p.o. daily on 06/28/2021.  He was not a good candidate for Tagrisso because of his prolonged QT and concern about arrhythmia while on Tagrisso.   His dose was reduced to 100 mg p.o. daily of Tarceva. His dose was reduced in April 2023. He is tolerating this well except for manageable diarrhea.  At his last appointment, his CT showed concern for disease progression. Therefore, Dr. Arbutus Ped ordered a PET scan.   The patient was seen with Dr. Arbutus Ped today.  Dr. Arbutus Ped personally and independently reviewed the scan and discussed results with the patient today.  The scan showed mild hypermetabolic activity in the left-sided pleural  thickening suspicious for pleural metastatic disease and mildly [place] subcarinal and left hilar lymph nodes which is nonspecific but could be reactive or neoplastic.  Dr. Arbutus Ped recommends discontinuing his current treatment.  Dr. Arbutus Ped discussed the options of targeted treatment with Tagrisso versus chemotherapy plus amivantamab, versus palliative care and hospice.  The patient was unable to start Tagrisso when he was first diagnosed with recurrent disease due to prolonged Qtc.   Will recheck an EKG today.  His EKG showed QTc of 422 ms.  However his EKG showed first-degree block.  There is no literature to suggest that this is a contraindication to Tagrisso.  Therefore we will start him on Tagrisso and we will repeat an EKG in 2 weeks at his next appointment.  He met with the pharmacist today to go over education.  We will see him back for 2 weeks for evaluation and repeat blood work and to manage any adverse side effects of treatment.  Hopefully the side effects from Tarceva will improve since he is no longer going to be taking this medication.  If his nosebleeds have started back, I encouraged him to call ENT to make an appointment.   Dr. Arbutus Ped plans to scan the patient in approximately 2 months from now.   The patient was advised to call immediately if he has any concerning symptoms in the interval. The patient voices understanding of current disease status and treatment options and is in agreement with the current care plan. All questions were answered. The patient knows to call the clinic with any problems, questions or concerns. We can certainly see the patient much sooner if necessary     Orders Placed This Encounter  Procedures   EKG 12-Lead   EKG 12-Lead    Standing Status:   Future    Standing Expiration Date:   03/02/2024     Johnette Abraham Riordan Walle, PA-C 03/03/23  ADDENDUM: Hematology/Oncology Attending: I had a face-to-face encounter with the patient today.   I reviewed his record, lab, scan and recommended his care plan.  This is a very pleasant 82 years old white male with recurrent/metastatic non-small cell  lung cancer, adenocarcinoma that was initially diagnosed as stage Ib in September 2020 status post wedge resection of the left upper lobe then the patient had evidence for disease recurrence in September 2022 with mediastinal as well as left hilar lymphadenopathy and left pleural metastatic disease.  Molecular studies showed positive EGFR mutation with deletion in exon 19.  The patient was initially considered for treatment with Tagrisso but because of QT prolongation at that time he was not a good candidate for the treatment and he started treatment with Tagrisso initially at a dose of 150 mg p.o. daily for 2 months but his dose was reduced to Tagrisso 100 mg p.o. daily starting Abrol 6 of 2023 secondary to intolerance to the higher dose.  The patient has been tolerating this treatment well except for the frequent episodes of diarrhea and fatigue.  Has been taking Imodium with some improvement.  His most recent CT scan of the chest showed evidence for left pleural thickening suspicious for disease progression.  The patient had a PET scan performed recently and he is here for evaluation and discussion of his PET scan results.  He was accompanied by his daughter.  I personally and independently reviewed the PET scan images and discussed the results with the patient and his daughter. Unfortunately he has evidence for hypermetabolic left pleural-based nodularity suspicious for disease recurrence. I had a lengthy discussion with the patient today about his current treatment options.  I recommended for him to discontinue his treatment with Tarceva because of lack of efficacy and evidence of disease progression at this point. I discussed with the patient several options for management of his condition including palliative care and hospice referral which he declined.   I also discussed with him treatment with Tagrisso 80 mg p.o. daily if his EKG showed no concerning QT prolongation versus treatment with systemic chemotherapy with carboplatin, Alimta and Amivantamab which was recently approved for second line treatment options for patient who have failed frontline EGFR tyrosine kinase inhibitor. After the lengthy discussion the patient and his daughter prefer to consider treatment with Tagrisso but if he fails this treatment we will consider him for the other option with the combination of chemotherapy and Amivantamab. The patient will see the pharmacist for oral oncolytic today for education about Tagrisso and also to help the patient with the refill of his medication. He will come back for follow-up visit in 2-3 weeks for evaluation and repeat blood work. He was advised to call immediately if he has any other concerning symptoms in the interval. The total time spent in the appointment was 30 minutes. Disclaimer: This note was dictated with voice recognition software. Similar sounding words can inadvertently be transcribed and may be missed upon review. Lajuana Matte, MD

## 2023-02-24 ENCOUNTER — Inpatient Hospital Stay: Payer: Medicare Other | Admitting: Physician Assistant

## 2023-02-26 ENCOUNTER — Ambulatory Visit (HOSPITAL_COMMUNITY)
Admission: RE | Admit: 2023-02-26 | Discharge: 2023-02-26 | Disposition: A | Discharge status: Home / Self Care | Payer: Medicare Other | Source: Ambulatory Visit | Attending: Internal Medicine | Admitting: Internal Medicine

## 2023-02-26 DIAGNOSIS — C3490 Malignant neoplasm of unspecified part of unspecified bronchus or lung: Secondary | ICD-10-CM | POA: Diagnosis present

## 2023-02-26 LAB — GLUCOSE, CAPILLARY: Glucose-Capillary: 103 mg/dL — ABNORMAL HIGH (ref 70–99)

## 2023-02-26 MED ORDER — FLUDEOXYGLUCOSE F - 18 (FDG) INJECTION
7.6400 | Freq: Once | INTRAVENOUS | Status: AC
  Administered 2023-02-26: 7.64 via INTRAVENOUS

## 2023-03-03 ENCOUNTER — Telehealth: Payer: Self-pay | Admitting: Pharmacy Technician

## 2023-03-03 ENCOUNTER — Inpatient Hospital Stay: Payer: Medicare Other | Attending: Physician Assistant

## 2023-03-03 ENCOUNTER — Other Ambulatory Visit (HOSPITAL_COMMUNITY): Payer: Self-pay

## 2023-03-03 ENCOUNTER — Encounter: Payer: Self-pay | Admitting: Physician Assistant

## 2023-03-03 ENCOUNTER — Other Ambulatory Visit: Payer: Self-pay

## 2023-03-03 ENCOUNTER — Inpatient Hospital Stay: Payer: Medicare Other | Attending: Physician Assistant | Admitting: Physician Assistant

## 2023-03-03 ENCOUNTER — Telehealth: Payer: Self-pay | Admitting: Pharmacist

## 2023-03-03 VITALS — BP 120/58 | HR 60 | Temp 97.7°F | Resp 15 | Wt 148.8 lb

## 2023-03-03 DIAGNOSIS — Z923 Personal history of irradiation: Secondary | ICD-10-CM | POA: Insufficient documentation

## 2023-03-03 DIAGNOSIS — I700 Atherosclerosis of aorta: Secondary | ICD-10-CM | POA: Diagnosis not present

## 2023-03-03 DIAGNOSIS — R197 Diarrhea, unspecified: Secondary | ICD-10-CM | POA: Diagnosis not present

## 2023-03-03 DIAGNOSIS — J8410 Pulmonary fibrosis, unspecified: Secondary | ICD-10-CM | POA: Diagnosis not present

## 2023-03-03 DIAGNOSIS — Z9226 Personal history of immune checkpoint inhibitor therapy: Secondary | ICD-10-CM | POA: Insufficient documentation

## 2023-03-03 DIAGNOSIS — Z7189 Other specified counseling: Secondary | ICD-10-CM

## 2023-03-03 DIAGNOSIS — I6523 Occlusion and stenosis of bilateral carotid arteries: Secondary | ICD-10-CM | POA: Diagnosis not present

## 2023-03-03 DIAGNOSIS — R0602 Shortness of breath: Secondary | ICD-10-CM | POA: Diagnosis not present

## 2023-03-03 DIAGNOSIS — C3490 Malignant neoplasm of unspecified part of unspecified bronchus or lung: Secondary | ICD-10-CM | POA: Diagnosis not present

## 2023-03-03 DIAGNOSIS — Z800 Family history of malignant neoplasm of digestive organs: Secondary | ICD-10-CM | POA: Insufficient documentation

## 2023-03-03 DIAGNOSIS — C782 Secondary malignant neoplasm of pleura: Secondary | ICD-10-CM | POA: Diagnosis not present

## 2023-03-03 DIAGNOSIS — R21 Rash and other nonspecific skin eruption: Secondary | ICD-10-CM | POA: Diagnosis not present

## 2023-03-03 DIAGNOSIS — J4489 Other specified chronic obstructive pulmonary disease: Secondary | ICD-10-CM | POA: Insufficient documentation

## 2023-03-03 DIAGNOSIS — R0989 Other specified symptoms and signs involving the circulatory and respiratory systems: Secondary | ICD-10-CM | POA: Insufficient documentation

## 2023-03-03 DIAGNOSIS — I4821 Permanent atrial fibrillation: Secondary | ICD-10-CM | POA: Diagnosis not present

## 2023-03-03 DIAGNOSIS — Z7901 Long term (current) use of anticoagulants: Secondary | ICD-10-CM | POA: Insufficient documentation

## 2023-03-03 DIAGNOSIS — D696 Thrombocytopenia, unspecified: Secondary | ICD-10-CM | POA: Diagnosis not present

## 2023-03-03 DIAGNOSIS — C3492 Malignant neoplasm of unspecified part of left bronchus or lung: Secondary | ICD-10-CM | POA: Diagnosis not present

## 2023-03-03 DIAGNOSIS — Z79899 Other long term (current) drug therapy: Secondary | ICD-10-CM | POA: Diagnosis not present

## 2023-03-03 DIAGNOSIS — C3412 Malignant neoplasm of upper lobe, left bronchus or lung: Secondary | ICD-10-CM | POA: Diagnosis present

## 2023-03-03 LAB — CBC WITH DIFFERENTIAL (CANCER CENTER ONLY)
Abs Immature Granulocytes: 0.03 10*3/uL (ref 0.00–0.07)
Basophils Absolute: 0 10*3/uL (ref 0.0–0.1)
Basophils Relative: 1 %
Eosinophils Absolute: 0.3 10*3/uL (ref 0.0–0.5)
Eosinophils Relative: 4 %
HCT: 39.5 % (ref 39.0–52.0)
Hemoglobin: 13.3 g/dL (ref 13.0–17.0)
Immature Granulocytes: 1 %
Lymphocytes Relative: 20 %
Lymphs Abs: 1.2 10*3/uL (ref 0.7–4.0)
MCH: 30.9 pg (ref 26.0–34.0)
MCHC: 33.7 g/dL (ref 30.0–36.0)
MCV: 91.6 fL (ref 80.0–100.0)
Monocytes Absolute: 0.7 10*3/uL (ref 0.1–1.0)
Monocytes Relative: 12 %
Neutro Abs: 3.7 10*3/uL (ref 1.7–7.7)
Neutrophils Relative %: 62 %
Platelet Count: 87 10*3/uL — ABNORMAL LOW (ref 150–400)
RBC: 4.31 MIL/uL (ref 4.22–5.81)
RDW: 16.5 % — ABNORMAL HIGH (ref 11.5–15.5)
WBC Count: 6 10*3/uL (ref 4.0–10.5)
nRBC: 0 % (ref 0.0–0.2)

## 2023-03-03 LAB — CMP (CANCER CENTER ONLY)
ALT: 16 U/L (ref 0–44)
AST: 24 U/L (ref 15–41)
Albumin: 3.9 g/dL (ref 3.5–5.0)
Alkaline Phosphatase: 97 U/L (ref 38–126)
Anion gap: 7 (ref 5–15)
BUN: 28 mg/dL — ABNORMAL HIGH (ref 8–23)
CO2: 25 mmol/L (ref 22–32)
Calcium: 9.8 mg/dL (ref 8.9–10.3)
Chloride: 102 mmol/L (ref 98–111)
Creatinine: 1.08 mg/dL (ref 0.61–1.24)
GFR, Estimated: >60 mL/min (ref >60)
Glucose, Bld: 231 mg/dL — ABNORMAL HIGH (ref 70–99)
Potassium: 4.2 mmol/L (ref 3.5–5.1)
Sodium: 134 mmol/L — ABNORMAL LOW (ref 135–145)
Total Bilirubin: 0.7 mg/dL (ref 0.3–1.2)
Total Protein: 7.6 g/dL (ref 6.5–8.1)

## 2023-03-03 NOTE — Telephone Encounter (Signed)
Oral Oncology Patient Advocate Encounter  Prior Authorization for Edgar Frisk has been approved.    PA# [id number] Effective dates: 03/03/23 through 05/31/24  Patients co-pay is $258.61.    Jinger Neighbors, CPhT-Adv Oncology Pharmacy Patient Advocate [facility] Direct Number: [phone]  Fax: [phone]

## 2023-03-03 NOTE — Telephone Encounter (Signed)
Oral Chemotherapy Pharmacist Encounter  I met with patient in clinic for overview of: Tagrisso (osimertinib) for the treatment of metastatic, EGFR mutation-positive (exon 19 deletion) NSCLC, planned duration until disease progression or unacceptable toxicity.   Counseled patient on administration, dosing, side effects, monitoring, drug-food interactions, safe handling, storage, and disposal.  CBC w/ Diff and CMP from 03/03/23 assessed, noted patient with pltc of 87 K/uL - no baseline dose adjustments required. Baseline EKG obtained in clinic on 03/03/23, QTcB . Patient will also have f/u EKG performed at 2 week follow up appt. Prescription dose and frequency assessed for appropriateness.  Patient will take Tagrisso 80 tablets, 1 tablet by mouth once daily, without regard to food.  Tagrisso start date: anticipate start date of 03/05/23 - (patient will take last dose of Tarceva on 03/04/23).  Adverse effects include but are not limited to: diarrhea, mouth sores, fatigue, dry skin, rash, nail changes, altered cardiac conduction, and decreased blood counts or electrolytes.  Diarrhea: Patient will obtain Imodium (loperamide) to have on hand if they experience diarrhea. Patient knows to alert the office of 4 or more loose stools above baseline.  Reviewed with patient importance of keeping a medication schedule and plan for any missed doses. No barriers to medication adherence identified.  Medication reconciliation performed and medication/allergy list updated. Current medication list in Epic reviewed, no relevant/significant DDIs with Tagrisso identified.  All questions answered.  Patient agreement for treatment documented in MD note on 03/03/23.  Mr. Huynh voiced understanding and appreciation.   Medication education given to patient. Patient knows to call the office with questions or concerns. Oral Chemotherapy Clinic phone number provided to patient.   Lenord Carbo, PharmD, BCPS,  BCOP Hematology/Oncology Clinical Pharmacist [facility] Oral Chemotherapy Navigation Clinics [phone] 03/03/2023 4:25 PM

## 2023-03-03 NOTE — Telephone Encounter (Signed)
Oral Oncology Patient Advocate Encounter   Received notification that prior authorization for Tagrisso is required.   PA submitted on 03/03/23 Key [id number] Status is pending     Jinger Neighbors, CPhT-Adv Oncology Pharmacy Patient Advocate [facility] Direct Number: [phone]  Fax: [phone]

## 2023-03-04 ENCOUNTER — Other Ambulatory Visit: Payer: Self-pay | Admitting: Internal Medicine

## 2023-03-04 ENCOUNTER — Other Ambulatory Visit: Payer: Self-pay | Admitting: Pharmacy Technician

## 2023-03-04 ENCOUNTER — Telehealth: Payer: Self-pay | Admitting: Internal Medicine

## 2023-03-04 ENCOUNTER — Other Ambulatory Visit (HOSPITAL_COMMUNITY): Payer: Self-pay

## 2023-03-04 ENCOUNTER — Other Ambulatory Visit: Payer: Self-pay

## 2023-03-04 MED ORDER — OSIMERTINIB MESYLATE 80 MG PO TABS
80.0000 mg | ORAL_TABLET | Freq: Every day | ORAL | 3 refills | Status: DC
  Filled 2023-03-04: qty 30, 30d supply, fill #0
  Filled 2023-03-30: qty 30, 30d supply, fill #1

## 2023-03-04 NOTE — Progress Notes (Signed)
Specialty Pharmacy Initial Fill Coordination Note  Vincent Velez is a 82 y.o. male contacted today regarding refills of specialty medication(s) Osimertinib Mesylate .  Patient requested Delivery  on 03/05/23  to verified address [address]  [place] [address]   Medication will be filled on 03/04/23.   Patient is aware of $0 copayment. This copay is after applying grants currently on file.

## 2023-03-04 NOTE — Telephone Encounter (Signed)
Scheduled per 10/02 los, called and spoke with patient's daughter who coordinates his appointments. Patient will be notified.

## 2023-03-04 NOTE — Progress Notes (Signed)
Oral Chemotherapy Pharmacist Encounter  Patient was counseled under telephone encounter from 03/03/23.  Lenord Carbo, PharmD, BCPS, BCOP Hematology/Oncology Clinical Pharmacist [facility] Oral Chemotherapy Navigation Clinics [phone] 03/04/2023 9:02 AM

## 2023-03-12 NOTE — Progress Notes (Signed)
[facility] OFFICE PROGRESS NOTE  [name], MD [address] [place] [address]  DIAGNOSIS: Recurrent/metastatic non-small cell lung cancer, adenocarcinoma initially diagnosed as a stage Ib (T2 a, N0, M0) adenocarcinoma in September 2020 status post wedge resection of the left upper lobe nodule.  The patient has disease recurrence and September 2022 with metastatic mediastinal lymphadenopathy as well as left hilar and left pleural metastatic disease    PD-L1 expression was negative.   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  First dose April 30, 2021. Status post 2 cycles. Discontinued due to molecular studies positive for EGFR mutation 3) Tarceva 150 mg p.o. daily started June 28, 2021.  Status post 2 month of treatment.  His dose was changed to Tarceva 100 mg p.o. daily on 09/04/2021 secondary to intolerance and frequent skin rash.  Status post  months of treatment  4)  Tarceva 150 mg p.o. daily started June 28, 2021.  Status post 2 month of treatment.  His dose was changed to Tarceva 100 mg p.o. daily on 09/04/2021 secondary to intolerance and frequent skin rash.  Status post 20  months of treatment  Discontinued due to disease progression  CURRENT THERAPY: To treatment with Tagrisso 80 mg p.o. daily.  First dose on ~03/05/23  INTERVAL HISTORY: Vincent Velez 82 y.o. male returns to the clinic today for a follow-up visit accompanied by his daughter.  The patient was last seen in the clinic on 03/03/2023.  At that point in time, the patient had a PET scan which showed disease progression with hypermetabolic activity  in the left-sided pleural thickening suspicious for pleural metastatic disease and mildly hypermetabolic carinal and left hilar lymph nodes which could be reactive or neoplastic.  Therefore, Dr. Arbutus Ped recommended switching his treatment to Tagrisso as long as his QTc is within normal limits.  He had an EKG and his QTc was normal. He actually was no longer in atrial fibrillation as well which he previously was told he had permanent atrial fibrillation.   He started Tagrisso ~03/05/23 and thus far has been tolerating it well. He was struggling with rashes and diarrhea with Tarceva before. He actually ahs been having constipation recently. His last bowel movement was yesterday. He is taking a stool softener once daily. He denies rashes.   Regarding his energy, he reports he has some good days and bad days.  Sometimes he may feel good and overdo it and cause some fatigue the following days.  He denies any fever, chills, or night sweats.  His weight is about stable.  He is drinking on average 3 protein supplemental drinks per day.  Sometimes foods do not taste the way they are supposed to.  He typically notices this more with meats or potatoes.  He reports stable dyspnea on exertion.  He may feel short of breath with walking distances.  With his normal activities of daily living, he states his breathing is "fine".  He denies any significant cough.  He may occasionally cough up some phlegm.  Denies any chest pain.  The patient has been struggling with epistaxis for several months.  His platelet count is lower  today than what is typical for him but he denies any changes in the frequency or severity of epistaxis.  He is scheduled to see ENT later this week on 03/19/2023.  He is also on a blood thinner for his history of A-fib.  Besides the epistaxis, he denies any hematuria, melena, or hematochezia.  Denies any nausea, vomiting, or recent diarrhea.  Denies any headache or visual changes.  The patient has diabetes and  states that his fairly well-controlled.   He is here today for evaluation and repeat blood work and to manage any adverse side effects of Tagrisso.       MEDICAL HISTORY: Past Medical History:  Diagnosis Date   AAA (abdominal aortic aneurysm) (HCC)    Anxiety    Arthritis    Asthma    when younger   Atrial fibrillation (HCC)    CAD (coronary artery disease)    Cataract    removed both   COPD (chronic obstructive pulmonary disease) (HCC)    Diabetes mellitus    Type II   Dysrhythmia    afib   Esophageal reflux    Family history of malignant neoplasm of gastrointestinal tract    Heart murmur    Hiatal hernia    Hyperlipemia    Hypertension    Lung nodule    a. PET scan highly suspicious for malignancy. Bronch with biopsy to be done after TAVR   Malignant neoplasm of prostate [facility])    prostate    Peripheral vascular disease (HCC)    Pneumonia    Stricture and stenosis of esophagus     ALLERGIES:  is allergic to macrolides and ketolides.  MEDICATIONS:  Current Outpatient Medications  Medication Sig Dispense Refill   methylPREDNISolone (MEDROL DOSEPAK) 4 MG TBPK tablet Use as instructed 21 tablet 0   ACCU-CHEK GUIDE test strip USE TO CHECK YOUR BLOOD SUGAR ONCE DAILY     acetaminophen (TYLENOL) 500 MG tablet Take 2 tablets (1,000 mg total) by mouth every 6 (six) hours as needed for mild pain or fever. 30 tablet 0   diltiazem (CARDIZEM SR) 60 MG 12 hr capsule Take 1 capsule (60 mg total) by mouth every 12 (twelve) hours. 60 capsule 7   dorzolamide-timolol (COSOPT) 22.3-6.8 MG/ML ophthalmic solution Place 1 drop into both eyes in the morning, at noon, and at bedtime.     glimepiride (AMARYL) 2 MG tablet Take 2 mg by mouth daily with breakfast.     JARDIANCE 25 MG TABS tablet Take 25 mg by mouth daily.     metFORMIN (GLUCOPHAGE-XR) 500 MG 24 hr tablet Take 1,000 mg by mouth every evening.      metoprolol succinate (TOPROL-XL) 25 MG 24 hr tablet Take 50 mg by mouth daily.      mirtazapine (REMERON) 15 MG tablet Take 1 tablet (15 mg total) by mouth at bedtime. 30 tablet 2   Multiple Vitamins-Minerals (MULTIVITAMIN WITH MINERALS) tablet Take 1 tablet by mouth daily.     Multiple Vitamins-Minerals (ZINC PO) Take 1 tablet by mouth daily.     osimertinib mesylate (TAGRISSO) 80 MG tablet Take 1 tablet (80 mg total) by mouth daily. 30 tablet 3   prochlorperazine (COMPAZINE) 10 MG tablet Take 1 tablet (10 mg total) by mouth every 6 (six) hours as needed for nausea or vomiting. 30 tablet 0   simvastatin (ZOCOR) 40 MG tablet Take 40 mg by mouth at bedtime.     XARELTO 20 MG TABS tablet TAKE 1 TABLET BY  MOUTH  DAILY WITH SUPPER 90 tablet 1   No current facility-administered medications for this visit.    SURGICAL HISTORY:  Past Surgical History:  Procedure Laterality Date   ABDOMINAL AORTA STENT     CARDIAC CATHETERIZATION     CARDIOVERSION N/A 12/04/2015   Procedure: CARDIOVERSION;  Surgeon: Thurmon Fair, MD;  Location: [facility] ENDOSCOPY;  Service: Cardiovascular;  Laterality: N/A;   COLONOSCOPY     FINGER SURGERY Right    middle finger- amputation   INSERTION PROSTATE RADIATION SEED     MEDIASTINOSCOPY N/A 01/06/2019   Procedure: MEDIASTINOSCOPY WITH BIOPIES;  Surgeon: Alleen Borne, MD;  Location: [facility] OR;  Service: Thoracic;  Laterality: N/A;   RIGHT/LEFT HEART CATH AND CORONARY ANGIOGRAPHY N/A 10/26/2018   Procedure: RIGHT/LEFT HEART CATH AND CORONARY ANGIOGRAPHY;  Surgeon: Kathleene Hazel, MD;  Location: [facility] INVASIVE CV LAB;  Service: Cardiovascular;  Laterality: N/A;   TEE WITHOUT CARDIOVERSION N/A 12/06/2018   Procedure: TRANSESOPHAGEAL ECHOCARDIOGRAM (TEE);  Surgeon: Kathleene Hazel, MD;  Location: [facility] INVASIVE CV LAB;  Service: Open Heart Surgery;  Laterality: N/A;   TRANSCATHETER AORTIC VALVE REPLACEMENT, TRANSFEMORAL N/A 12/06/2018   Procedure: TRANSCATHETER AORTIC VALVE REPLACEMENT, TRANSFEMORAL;  Surgeon: Kathleene Hazel, MD;  Location: [facility]  INVASIVE CV LAB;  Service: Open Heart Surgery;  Laterality: N/A;   UPPER GASTROINTESTINAL ENDOSCOPY     VIDEO ASSISTED THORACOSCOPY (VATS)/THOROCOTOMY Left 02/02/2019   Procedure: VIDEO ASSISTED THORACOSCOPY (VATS)/THOROCOTOMY;  Surgeon: Alleen Borne, MD;  Location: [facility] OR;  Service: Thoracic;  Laterality: Left;   VIDEO BRONCHOSCOPY N/A 01/06/2019   Procedure: VIDEO BRONCHOSCOPY;  Surgeon: Alleen Borne, MD;  Location: [facility] OR;  Service: Thoracic;  Laterality: N/A;   VIDEO BRONCHOSCOPY WITH ENDOBRONCHIAL ULTRASOUND N/A 03/07/2021   Procedure: VIDEO BRONCHOSCOPY WITH ENDOBRONCHIAL ULTRASOUND;  Surgeon: Loreli Slot, MD;  Location: [facility] OR;  Service: Thoracic;  Laterality: N/A;   WEDGE RESECTION Left 02/02/2019   Procedure: LUNG RESECTION;  Surgeon: Alleen Borne, MD;  Location: [facility] OR;  Service: Thoracic;  Laterality: Left;    REVIEW OF SYSTEMS:   Review of Systems  Constitutional: Positive for intermittent fatigue. Negative for chills, fever and unexpected weight change.  HENT: Positive for epistaxis. Negative for mouth sores, nosebleeds, sore throat and trouble swallowing.   Eyes: Negative for eye problems and icterus.  Respiratory: Positive for shortness of breath with certain activities. Negative for cough, hemoptysis, and wheezing.   Cardiovascular: Negative for chest pain and leg swelling.  Gastrointestinal: Positive for constipation. Negative for abdominal pain, diarrhea, nausea and vomiting.  Genitourinary: Negative for bladder incontinence, difficulty urinating, dysuria, frequency and hematuria.   Musculoskeletal: Negative for back pain, gait problem, neck pain and neck stiffness.  Skin: Negative for itching and rash.  Neurological: Negative for dizziness, extremity weakness, gait problem, headaches, light-headedness and seizures.  Hematological: Negative for adenopathy. Does not bruise/bleed easily.  Psychiatric/Behavioral: Negative for confusion, depression and sleep  disturbance. The patient is not nervous/anxious.     PHYSICAL EXAMINATION:  Blood pressure 132/61, pulse (!) 56, temperature (!) 97.3 F (36.3 C), temperature source Temporal, resp. rate 16, weight 148 lb 11.2 oz (67.4 kg), SpO2 94%.  ECOG PERFORMANCE STATUS: 1  Physical Exam  Constitutional: Oriented to person, place, and time and well-developed, well-nourished, and in no distress.  HENT:  Head: Normocephalic and atraumatic.  Mouth/Throat: Oropharynx is clear and moist. No oropharyngeal exudate.  Eyes: Conjunctivae are normal. Right eye exhibits no discharge. Left eye exhibits no discharge. No scleral  icterus.  Neck: Normal range of motion. Neck supple.  Cardiovascular: bradycardia, regular rhythm, normal heart sounds and intact distal pulses.   Pulmonary/Chest: Effort normal and breath sounds normal. No respiratory distress. No wheezes. No rales.  Abdominal: Soft. Bowel sounds are normal. Exhibits no distension and no mass. There is no tenderness.  Musculoskeletal: Normal range of motion. Exhibits no edema.  Lymphadenopathy:    No cervical adenopathy.  Neurological: Alert and oriented to person, place, and time. Exhibits muscle wasting. Gait normal. Coordination normal.  Skin: Skin is warm and dry. No rash noted. Not diaphoretic. No erythema. No pallor.  Psychiatric: Mood, memory and judgment normal.  Vitals reviewed.  LABORATORY DATA: Lab Results  Component Value Date   WBC 6.9 03/17/2023   HGB 13.2 03/17/2023   HCT 39.5 03/17/2023   MCV 91.6 03/17/2023   PLT 42 (L) 03/17/2023      Chemistry      Component Value Date/Time   NA 135 03/17/2023 1445   NA 138 12/06/2019 1438   K 4.4 03/17/2023 1445   CL 102 03/17/2023 1445   CO2 26 03/17/2023 1445   BUN 32 (H) 03/17/2023 1445   BUN 14 12/06/2019 1438   CREATININE 1.03 03/17/2023 1445   CREATININE 0.81 11/29/2015 1335      Component Value Date/Time   CALCIUM 10.1 03/17/2023 1445   ALKPHOS 91 03/17/2023 1445   AST 24  03/17/2023 1445   ALT 15 03/17/2023 1445   BILITOT 0.8 03/17/2023 1445       RADIOGRAPHIC STUDIES:  NM PET Image Restage (PS) Skull Base to Thigh (F-18 FDG)  Result Date: 03/03/2023 CLINICAL DATA:  Subsequent treatment strategy for non-small cell lung cancer. EXAM: NUCLEAR MEDICINE PET SKULL BASE TO THIGH TECHNIQUE: 7.74 mCi F-18 FDG was injected intravenously. Full-ring PET imaging was performed from the skull base to thigh after the radiotracer. CT data was obtained and used for attenuation correction and anatomic localization. Fasting blood glucose: 103 mg/dl COMPARISON:  PET-CT 16/03/9603. CT of the chest, abdomen and pelvis 01/20/2023. FINDINGS: Mediastinal blood pool activity: SUV max 1.6 NECK: No hypermetabolic cervical lymph nodes are identified.Fairly symmetric activity within the lymphoid tissue of Waldeyer's ring is within physiologic limits. No suspicious activity identified within the pharyngeal mucosal space. Incidental CT findings: Bilateral carotid atherosclerosis. CHEST: There are small hypermetabolic left hilar and subcarinal lymph nodes. 1.3 cm subcarinal node on image 73/4 has an SUV max of 4.1. The individual hypermetabolic left hilar nodes are not well delineated on the CT images, although there is a node along the inferior aspect of the left hilum with an SUV max of 4.7. The increased asymmetric left-sided pleural thickening seen on recent CT is associated with mild hypermetabolic activity, greatest laterally demonstrating an SUV max of 4.1. Some of the hilar activity on the left may be due to adjacent pleural disease. No dominant lung mass or suspicious central hypermetabolic pulmonary activity. Incidental CT findings: Severe pulmonary fibrosis with probable superimposed dependent atelectasis in both lungs. Diffuse atherosclerosis of the aorta, great vessels and coronary arteries post TAVR procedure. The heart is mildly enlarged. ABDOMEN/PELVIS: There is no hypermetabolic activity  within the liver, adrenal glands, spleen or pancreas. There is no hypermetabolic nodal activity in the abdomen or pelvis. Incidental CT findings: Unchanged 1.8 cm left adrenal nodule on image 107/4. 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 Diffuse aortic and branch vessel atherosclerosis post aorto bi-iliac endograft stenting. Prostate brachytherapy seeds noted. SKELETON: There is no hypermetabolic activity to suggest osseous metastatic disease. Incidental CT findings: Mild spondylosis. Unchanged coarse calcification in the subcutaneous fat of the inferior right buttocks, without hypermetabolic activity. IMPRESSION: 1. The asymmetric left-sided pleural thickening seen on recent CT is associated with mild hypermetabolic activity, suspicious for pleural metastatic disease. 2. Mildly hypermetabolic subcarinal and left hilar lymph nodes are nonspecific and could be reactive or neoplastic. 3. No evidence of distant metastatic disease. 4. 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 5.  Aortic Atherosclerosis (ICD10-I70.0). Electronically Signed   By: Carey Bullocks M.D.   On: 03/03/2023 09:15     ASSESSMENT/PLAN:  This is a very pleasant 82 year old Caucasian male diagnosed with recurrent/metastatic non-small cell lung cancer, adenocarcinoma that was initially diagnosed as stage Ib (T2 a, N0, M0) adenocarcinoma in September 2020 status post wedge resection of the left lower lobe lung nodule and he had recurrence in September 2022 presented with metastatic mediastinal lymphadenopathy in addition to left hilar and left pleural metastatic disease.  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    He underwent 2 cycles of systemic palliative chemotherapy with carboplatin AUC of 5 and Alimta 500 mg per  metered squared and Keytruda 200 mg IV every 3 weeks.  He received 2 cycles.  This was discontinued due to receiving the results of his molecular studies which show EGFR mutation.   He was started on treatment with Tarceva 150 mg p.o. daily on 06/28/2021.  He was not a good candidate for Tagrisso because of his prolonged QT and concern about arrhythmia while on Tagrisso.   His dose was reduced to 100 mg p.o. daily of Tarceva. His dose was reduced in April 2023. He was on tarceva for about 20 months.   He had evidence of disease progression on PET scan in September 2024.   His treatment with Tarceva was discontinued and he was started on Tagrisso 80 mg p.o. daily on ~03/05/23. He thus far is tolerating this well.    Labs were reviewed. I also reviewed the labs with Dr. Arbutus Ped. His labs show worsening thrombocytopenia with a platelet count of 42k.   The patient has been struggling with nosebleeds, however, he denies any increase in severity and frequency of nosebleeds recently. He also denies any other abnormal bleeding. He is scheduled to see ENT later this week on 10/18. He is also on a blood thinner due to history of persistent atrial fibrillation, although, he has been in NSR for the last two appointments.  Dr. Arbutus Ped would recommend that he continue his blood thinner for now.  However we did review bleeding precautions.  Should he develop any bleeding concerns, he was advised to call us for further instruction.  We may recommend holding his blood thinner temporarily if he has bleeding.  Dr. Arbutus Ped would recommend that he continue on Tagrisso at the same dose with close monitoring.  Dr. Arbutus Ped recommends a Medrol Dosepak to help with his thrombocytopenia.  The patient does have diabetes although it is in fairly good control per patient report.  He was advised to monitor his blood sugar closely while taking steroids.  We also discussed possible side effects of steroid such as insomnia, flushing, and  increased appetite.  He has taken a Medrol Dosepak before.  We will recheck his CBC to assess  his platelet count next week.  We will see him back for labs and a follow-up in 2 weeks.  He had a repeat EKG today to monitor his QTc interval since he has a history of prolonged QT.  If QT interval remains within normal limits today at 438 and 420 ms today.  We will repeat an EKG in 2 weeks.  The patient was advised to call immediately if he has any concerning symptoms in the interval. The patient voices understanding of current disease status and treatment options and is in agreement with the current care plan. All questions were answered. The patient knows to call the clinic with any problems, questions or concerns. We can certainly see the patient much sooner if necessary    Orders Placed This Encounter  Procedures   CBC with Differential (Cancer Center Only)    Standing Status:   Standing    Number of Occurrences:   2    Standing Expiration Date:   03/16/2024   CMP (Cancer Center only)    Standing Status:   Future    Standing Expiration Date:   03/16/2024   EKG 12-Lead    Ordered by an unspecified provider    EKG 12-Lead    Ordered by an unspecified provider    EKG 12-Lead    Standing Status:   Future    Standing Expiration Date:   03/16/2024      The total time spent in the appointment was 30-39 minutes  Heliodoro Domagalski L Alexsandra Shontz, PA-C 03/17/23

## 2023-03-17 ENCOUNTER — Telehealth (INDEPENDENT_AMBULATORY_CARE_PROVIDER_SITE_OTHER): Payer: Self-pay | Admitting: Otolaryngology

## 2023-03-17 ENCOUNTER — Inpatient Hospital Stay: Payer: Medicare Other

## 2023-03-17 ENCOUNTER — Inpatient Hospital Stay: Payer: Medicare Other | Admitting: Physician Assistant

## 2023-03-17 VITALS — BP 132/61 | HR 56 | Temp 97.3°F | Resp 16 | Wt 148.7 lb

## 2023-03-17 DIAGNOSIS — C3490 Malignant neoplasm of unspecified part of unspecified bronchus or lung: Secondary | ICD-10-CM | POA: Diagnosis not present

## 2023-03-17 DIAGNOSIS — D696 Thrombocytopenia, unspecified: Secondary | ICD-10-CM

## 2023-03-17 DIAGNOSIS — C3492 Malignant neoplasm of unspecified part of left bronchus or lung: Secondary | ICD-10-CM | POA: Diagnosis not present

## 2023-03-17 DIAGNOSIS — C3412 Malignant neoplasm of upper lobe, left bronchus or lung: Secondary | ICD-10-CM | POA: Diagnosis not present

## 2023-03-17 LAB — CBC WITH DIFFERENTIAL (CANCER CENTER ONLY)
Abs Immature Granulocytes: 0.05 10*3/uL (ref 0.00–0.07)
Basophils Absolute: 0 10*3/uL (ref 0.0–0.1)
Basophils Relative: 0 %
Eosinophils Absolute: 0.1 10*3/uL (ref 0.0–0.5)
Eosinophils Relative: 2 %
HCT: 39.5 % (ref 39.0–52.0)
Hemoglobin: 13.2 g/dL (ref 13.0–17.0)
Immature Granulocytes: 1 %
Lymphocytes Relative: 14 %
Lymphs Abs: 1 10*3/uL (ref 0.7–4.0)
MCH: 30.6 pg (ref 26.0–34.0)
MCHC: 33.4 g/dL (ref 30.0–36.0)
MCV: 91.6 fL (ref 80.0–100.0)
Monocytes Absolute: 0.6 10*3/uL (ref 0.1–1.0)
Monocytes Relative: 8 %
Neutro Abs: 5.2 10*3/uL (ref 1.7–7.7)
Neutrophils Relative %: 75 %
Platelet Count: 42 10*3/uL — ABNORMAL LOW (ref 150–400)
RBC: 4.31 MIL/uL (ref 4.22–5.81)
RDW: 15.9 % — ABNORMAL HIGH (ref 11.5–15.5)
WBC Count: 6.9 10*3/uL (ref 4.0–10.5)
nRBC: 0 % (ref 0.0–0.2)

## 2023-03-17 LAB — CMP (CANCER CENTER ONLY)
ALT: 15 U/L (ref 0–44)
AST: 24 U/L (ref 15–41)
Albumin: 4.1 g/dL (ref 3.5–5.0)
Alkaline Phosphatase: 91 U/L (ref 38–126)
Anion gap: 7 (ref 5–15)
BUN: 32 mg/dL — ABNORMAL HIGH (ref 8–23)
CO2: 26 mmol/L (ref 22–32)
Calcium: 10.1 mg/dL (ref 8.9–10.3)
Chloride: 102 mmol/L (ref 98–111)
Creatinine: 1.03 mg/dL (ref 0.61–1.24)
GFR, Estimated: >60 mL/min (ref >60)
Glucose, Bld: 128 mg/dL — ABNORMAL HIGH (ref 70–99)
Potassium: 4.4 mmol/L (ref 3.5–5.1)
Sodium: 135 mmol/L (ref 135–145)
Total Bilirubin: 0.8 mg/dL (ref 0.3–1.2)
Total Protein: 8 g/dL (ref 6.5–8.1)

## 2023-03-17 MED ORDER — METHYLPREDNISOLONE 4 MG PO TBPK
ORAL_TABLET | ORAL | 0 refills | Status: DC
Start: 2023-03-17 — End: 2023-03-30

## 2023-03-17 NOTE — Telephone Encounter (Signed)
Called patient to provide [address] address. No answer

## 2023-03-19 ENCOUNTER — Ambulatory Visit (INDEPENDENT_AMBULATORY_CARE_PROVIDER_SITE_OTHER): Payer: Medicare Other | Admitting: Otolaryngology

## 2023-03-19 ENCOUNTER — Encounter (INDEPENDENT_AMBULATORY_CARE_PROVIDER_SITE_OTHER): Payer: Self-pay

## 2023-03-19 VITALS — Ht 69.0 in | Wt 148.0 lb

## 2023-03-19 DIAGNOSIS — R040 Epistaxis: Secondary | ICD-10-CM | POA: Diagnosis not present

## 2023-03-19 DIAGNOSIS — Z7901 Long term (current) use of anticoagulants: Secondary | ICD-10-CM | POA: Diagnosis not present

## 2023-03-19 MED ORDER — MUPIROCIN 2 % EX OINT: 1.0000 | TOPICAL_OINTMENT | Freq: Two times a day (BID) | CUTANEOUS | 0 refills | Status: AC

## 2023-03-19 MED ORDER — AYR SALINE NASAL NA GEL: 1.0000 | NASAL | 3 refills | Status: DC | PRN

## 2023-03-19 NOTE — Progress Notes (Unsigned)
Dear Dr. Theodis Blaze, Here is my assessment for our mutual patient, Vincent Velez. Thank you for allowing me the opportunity to care for your patient. Please do not hesitate to contact me should you have any other questions. Sincerely, Dr. Jovita Kussmaul  Otolaryngology Clinic Note Referring provider: Dr. Theodis Blaze HPI:  Vincent Velez is a 82 y.o. male kindly referred by Dr. Theodis Blaze for evaluation of epistaxis. Have not had a nose bleed in 3-4 days. Getting them every couple of weeks. Uses pressure to stop - always right side. Epistaxis history:  HTN: Yes Frequency: twice a month; last one 3-4 days Side: Right CKD/Liver dysfunction: No Anticoagulation/AP: Xarelto (5-10 years ago) Trauma: No History of Sinusitis: No Nasal obstruction: No Nasal procedures: No Current nasal medication use: No  Pressure normally stops the bleeds, sometimes lasts longer.   H&N Surgery: No Personal or FHx of bleeding dz or anesthesia difficulty: no  PMHx: CAD s/p bypass, DM, A-fib, GERD, AAA, Asthma, HLD, Prostate, Lung (now recurrent)  Independent Review of Additional Tests or Records:  PCP notes and CBC reviewed - Hgb 13, Plt 42 (03/2023) PET 9/27: no hypermetabolic lesions in OP or upper airway or nasal cavity; no significant sinonasal disease - well aerated CTA 12/2022: extensive pneumatization including around ant skull base but no sinonasal opacification or masses noted; sinuous septum with large left septal spur  PMH/Meds/All/SocHx/FamHx/ROS:   Past Medical History:  Diagnosis Date   AAA (abdominal aortic aneurysm) (HCC)    Anxiety    Arthritis    Asthma    when younger   Atrial fibrillation (HCC)    CAD (coronary artery disease)    Cataract    removed both   COPD (chronic obstructive pulmonary disease) (HCC)    Diabetes mellitus    Type II   Dysrhythmia    afib   Esophageal reflux    Family history of malignant neoplasm of gastrointestinal tract    Heart murmur     Hiatal hernia    Hyperlipemia    Hypertension    Lung nodule    a. PET scan highly suspicious for malignancy. Bronch with biopsy to be done after TAVR   Malignant neoplasm of prostate (HCC)    prostate    Peripheral vascular disease (HCC)    Pneumonia    Stricture and stenosis of esophagus      Past Surgical History:  Procedure Laterality Date   ABDOMINAL AORTA STENT     CARDIAC CATHETERIZATION     CARDIOVERSION N/A 12/04/2015   Procedure: CARDIOVERSION;  Surgeon: Thurmon Fair, MD;  Location: [facility];  Service: Cardiovascular;  Laterality: N/A;   COLONOSCOPY     FINGER SURGERY Right    middle finger- amputation   INSERTION PROSTATE RADIATION SEED     MEDIASTINOSCOPY N/A 01/06/2019   Procedure: MEDIASTINOSCOPY WITH BIOPIES;  Surgeon: Alleen Borne, MD;  Location: [facility] OR;  Service: Thoracic;  Laterality: N/A;   RIGHT/LEFT HEART CATH AND CORONARY ANGIOGRAPHY N/A 10/26/2018   Procedure: RIGHT/LEFT HEART CATH AND CORONARY ANGIOGRAPHY;  Surgeon: Kathleene Hazel, MD;  Location: [facility]AS[facility];  Service: Cardiovascular;  Laterality: N/A;   TEE WITHOUT CARDIOVERSION N/A 12/06/2018   Procedure: TRANSESOPHAGEAL ECHOCARDIOGRAM (TEE);  Surgeon: Kathleene Hazel, MD;  Location: [facility] INVASIVE CV LAB;  Service: Open Heart Surgery;  Laterality: N/A;   TRANSCATHETER AORTIC VALVE REPLACEMENT, TRANSFEMORAL N/A 12/06/2018   Procedure: TRANSCATHETER AORTIC VALVE REPLACEMENT, TRANSFEMORAL;  Surgeon: Kathleene Hazel, MD;  Location: [facility];  Service: Open Heart Surgery;  Laterality: N/A;   UPPER GASTROINTESTINAL ENDOSCOPY     VIDEO ASSISTED THORACOSCOPY (VATS)/THOROCOTOMY Left 02/02/2019   Procedure: VIDEO ASSISTED THORACOSCOPY (VATS)/THOROCOTOMY;  Surgeon: Alleen Borne, MD;  Location: [facility];  Service: Thoracic;  Laterality: Left;   VIDEO BRONCHOSCOPY N/A 01/06/2019   Procedure: VIDEO BRONCHOSCOPY;  Surgeon: Alleen Borne, MD;  Location: [facility];  Service: Thoracic;   Laterality: N/A;   VIDEO BRONCHOSCOPY WITH ENDOBRONCHIAL ULTRASOUND N/A 03/07/2021   Procedure: VIDEO BRONCHOSCOPY WITH ENDOBRONCHIAL ULTRASOUND;  Surgeon: Loreli Slot, MD;  Location: [facility];  Service: Thoracic;  Laterality: N/A;   WEDGE RESECTION Left 02/02/2019   Procedure: LUNG RESECTION;  Surgeon: Alleen Borne, MD;  Location: [facility];  Service: Thoracic;  Laterality: Left;    Family History  Problem Relation Age of Onset   Colon cancer Father        questionable   Diabetes Mother    Heart disease Mother        Heart Disease before age 42   Deep vein thrombosis Mother    Hyperlipidemia Mother    Hypertension Mother    Varicose Veins Mother    Hypertension Brother    Heart attack Brother    Prostate cancer Brother    Colon polyps Neg Hx    Esophageal cancer Neg Hx    Rectal cancer Neg Hx    Stomach cancer Neg Hx      Social Connections: Not on file     Current Outpatient Medications:    ACCU-CHEK GUIDE test strip, USE TO CHECK YOUR BLOOD SUGAR ONCE DAILY, Disp: , Rfl:    acetaminophen (TYLENOL) 500 MG tablet, Take 2 tablets (1,000 mg total) by mouth every 6 (six) hours as needed for mild pain or fever., Disp: 30 tablet, Rfl: 0   diltiazem (CARDIZEM SR) 60 MG 12 hr capsule, Take 1 capsule (60 mg total) by mouth every 12 (twelve) hours., Disp: 60 capsule, Rfl: 7   dorzolamide-timolol (COSOPT) 22.3-6.8 MG/ML ophthalmic solution, Place 1 drop into both eyes in the morning, at noon, and at bedtime., Disp: , Rfl:    glimepiride (AMARYL) 2 MG tablet, Take 2 mg by mouth daily with breakfast., Disp: , Rfl:    JARDIANCE 25 MG TABS tablet, Take 25 mg by mouth daily., Disp: , Rfl:    metFORMIN (GLUCOPHAGE-XR) 500 MG 24 hr tablet, Take 1,000 mg by mouth every evening. , Disp: , Rfl:    methylPREDNISolone (MEDROL DOSEPAK) 4 MG TBPK tablet, Use as instructed, Disp: 21 tablet, Rfl: 0   metoprolol succinate (TOPROL-XL) 25 MG 24 hr tablet, Take 50 mg by mouth daily., Disp: , Rfl:     mirtazapine (REMERON) 15 MG tablet, Take 1 tablet (15 mg total) by mouth at bedtime., Disp: 30 tablet, Rfl: 2   Multiple Vitamins-Minerals (MULTIVITAMIN WITH MINERALS) tablet, Take 1 tablet by mouth daily., Disp: , Rfl:    Multiple Vitamins-Minerals (ZINC PO), Take 1 tablet by mouth daily., Disp: , Rfl:    osimertinib mesylate (TAGRISSO) 80 MG tablet, Take 1 tablet (80 mg total) by mouth daily., Disp: 30 tablet, Rfl: 3   prochlorperazine (COMPAZINE) 10 MG tablet, Take 1 tablet (10 mg total) by mouth every 6 (six) hours as needed for nausea or vomiting., Disp: 30 tablet, Rfl: 0   simvastatin (ZOCOR) 40 MG tablet, Take 40 mg by mouth at bedtime., Disp: , Rfl:    XARELTO 20 MG TABS tablet, TAKE 1 TABLET BY MOUTH  DAILY WITH SUPPER, Disp: 90 tablet, Rfl: 1   Physical Exam:   Ht 5\' 9"  (1.753 m)   Wt 148 lb (67.1 kg)   BMI 21.86 kg/m    Salient findings:  CN II-XII intact *** Bilateral EAC clear and TM intact with well pneumatized middle ear spaces Weber 512: *** Rinne 512: AC > BC b/l *** Rine 1024: AC > BC b/l *** Anterior rhinoscopy: Septum ***; bilateral inferior turbinates with *** No lesions of oral cavity/oropharynx; dentition *** No obviously palpable neck masses/lymphadenopathy/thyromegaly No respiratory distress or stridor***  Procedures:  None***  Impression & Plans:  Tian Douds is a 82 y.o. male with ***   - f/u ***   Thank you for allowing me the opportunity to care for your patient. Please do not hesitate to contact me should you have any other questions.  Sincerely, Jovita Kussmaul, MD Otolarynoglogist (ENT), [facility] Phone: [phone] Fax: [phone]  03/19/2023, 2:52 PM

## 2023-03-20 ENCOUNTER — Emergency Department (HOSPITAL_BASED_OUTPATIENT_CLINIC_OR_DEPARTMENT_OTHER)
Admission: EM | Admit: 2023-03-20 | Discharge: 2023-03-20 | Disposition: A | Discharge status: Home / Self Care | Payer: Medicare Other | Attending: Emergency Medicine | Admitting: Emergency Medicine

## 2023-03-20 ENCOUNTER — Other Ambulatory Visit: Payer: Self-pay

## 2023-03-20 ENCOUNTER — Encounter (HOSPITAL_BASED_OUTPATIENT_CLINIC_OR_DEPARTMENT_OTHER): Payer: Self-pay | Admitting: Emergency Medicine

## 2023-03-20 DIAGNOSIS — R040 Epistaxis: Secondary | ICD-10-CM | POA: Insufficient documentation

## 2023-03-20 MED ORDER — OXYMETAZOLINE HCL 0.05 % NA SOLN
1.0000 | Freq: Once | NASAL | Status: AC
  Administered 2023-03-20: 1 via NASAL
  Filled 2023-03-20: qty 30

## 2023-03-20 NOTE — ED Provider Notes (Signed)
EMERGENCY DEPARTMENT AT [facility] Provider Note   CSN: [id number] Arrival date & time: 03/20/23  1820     History Chief Complaint  Patient presents with   Epistaxis    HPI Vincent Velez is a 82 y.o. male presenting for epistaxis.  Extensive medical history uses Xarelto.   Patient's recorded medical, surgical, social, medication list and allergies were reviewed in the Snapshot window as part of the initial history.   Review of Systems   Review of Systems  Constitutional:  Negative for chills and fever.  HENT:  Positive for nosebleeds. Negative for ear pain and sore throat.   Eyes:  Negative for pain and visual disturbance.  Respiratory:  Negative for cough and shortness of breath.   Cardiovascular:  Negative for chest pain and palpitations.  Gastrointestinal:  Negative for abdominal pain and vomiting.  Genitourinary:  Negative for dysuria and hematuria.  Musculoskeletal:  Negative for arthralgias and back pain.  Skin:  Negative for color change and rash.  Neurological:  Negative for seizures and syncope.  All other systems reviewed and are negative.   Physical Exam Updated Vital Signs BP (!) 141/98 (BP Location: Right Arm)   Pulse (!) 56   Temp 97.6 F (36.4 C) (Oral)   Resp 20   SpO2 97%  Physical Exam Vitals and nursing note reviewed.  Constitutional:      General: He is not in acute distress.    Appearance: He is well-developed.  HENT:     Head: Normocephalic and atraumatic.  Eyes:     Conjunctiva/sclera: Conjunctivae normal.  Cardiovascular:     Rate and Rhythm: Normal rate and regular rhythm.     Heart sounds: No murmur heard. Pulmonary:     Effort: Pulmonary effort is normal. No respiratory distress.     Breath sounds: Normal breath sounds.  Abdominal:     General: Abdomen is flat. There is no distension.     Palpations: Abdomen is soft.     Tenderness: There is no abdominal tenderness.  Musculoskeletal:        General:  No swelling or deformity.     Cervical back: Neck supple.  Skin:    General: Skin is warm and dry.     Capillary Refill: Capillary refill takes less than 2 seconds.  Neurological:     Mental Status: He is alert and oriented to person, place, and time. Mental status is at baseline.  Psychiatric:        Mood and Affect: Mood normal.      ED Course/ Medical Decision Making/ A&P    Procedures Procedures   Medications Ordered in ED Medications  oxymetazoline (AFRIN) 0.05 % nasal spray 1 spray (1 spray Each Nare Given 03/20/23 1843)    Medical Decision Making:   82 year old male with epistaxis.  Resolved with Afrin.  Observed for 30 minutes no further bleeding episodes.  Offered lab work for [employer] but patient states he feels fine and would rather be discharged.  Family at bedside is in agreement given well appearance patient discharged with no further acute events.  Disposition:  I have considered need for hospitalization, however, considering all of the above, I believe this patient is stable for discharge at this time.  Patient/family educated about specific return precautions for given chief complaint and symptoms.  Patient/family educated about follow-up with PCP.     Patient/family expressed understanding of return precautions and need for follow-up. Patient spoken to regarding all imaging  and laboratory results and appropriate follow up for these results. All education provided in verbal form with additional information in written form. Time was allowed for answering of patient questions. Patient discharged.    Emergency Department Medication Summary:   Medications  oxymetazoline (AFRIN) 0.05 % nasal spray 1 spray (1 spray Each Nare Given 03/20/23 1843)        Clinical Impression:  1. Epistaxis      Discharge   Final Clinical Impression(s) / ED Diagnoses Final diagnoses:  Epistaxis    Rx / DC Orders ED Discharge Orders     None          Glyn Ade, MD 03/20/23 4098

## 2023-03-20 NOTE — ED Triage Notes (Signed)
Pt with nosebleed since this morning; takes Xarelto

## 2023-03-20 NOTE — ED Notes (Signed)
Discharge paperwork reviewed entirely with patient, including follow up care. Pain was under control. No prescriptions were called in, but all questions were addressed.  Pt verbalized understanding as well as all parties involved. No questions or concerns voiced at the time of discharge. No acute distress noted.   Pt ambulated out to PVA without incident or assistance.  Pt advised they will seek followup care with a specialist and notify their PCP.

## 2023-03-23 ENCOUNTER — Other Ambulatory Visit: Payer: Self-pay

## 2023-03-23 ENCOUNTER — Telehealth: Payer: Self-pay | Admitting: Physician Assistant

## 2023-03-23 ENCOUNTER — Other Ambulatory Visit: Payer: Medicare Other

## 2023-03-24 ENCOUNTER — Other Ambulatory Visit: Payer: Self-pay

## 2023-03-25 ENCOUNTER — Encounter (INDEPENDENT_AMBULATORY_CARE_PROVIDER_SITE_OTHER): Payer: Self-pay

## 2023-03-25 ENCOUNTER — Ambulatory Visit (INDEPENDENT_AMBULATORY_CARE_PROVIDER_SITE_OTHER): Payer: Medicare Other

## 2023-03-25 VITALS — Ht 69.0 in | Wt 148.0 lb

## 2023-03-25 DIAGNOSIS — Z7901 Long term (current) use of anticoagulants: Secondary | ICD-10-CM | POA: Diagnosis not present

## 2023-03-25 DIAGNOSIS — R040 Epistaxis: Secondary | ICD-10-CM

## 2023-03-25 NOTE — Progress Notes (Signed)
Dear Dr. Nehemiah Settle, Here is my assessment for our mutual patient, Vincent Velez. Thank you for allowing me the opportunity to care for your patient. Please do not hesitate to contact me should you have any other questions. Sincerely, Dr. Jovita Kussmaul  Otolaryngology Clinic Note Referring provider: Dr. Nehemiah Settle HPI:  Vincent Velez is a 82 y.o. male kindly referred by Dr. Nehemiah Settle for evaluation of epistaxis.  Initial visit: Epistaxis history:  HTN: Yes Frequency: twice a month; last one 3-4 days ago Side: Right CKD/Liver dysfunction: No Anticoagulation/AP: Xarelto (started 5-10 years ago) Trauma: No History of Sinusitis: No Nasal obstruction: No Nasal procedures: No Current nasal medication use: No Uses pressure to stop - always right side, lasts about 10-15 mins Pressure normally stops the bleeds, sometimes lasts longer.   Today: Patient presents for follow up. He had another episode of epistaxis day after he saw me, requiring afrin. He has not had further episodes. He presents today for epistaxis control  H&N Surgery: No Personal or FHx of bleeding dz or anesthesia difficulty: no  PMHx: CAD s/p bypass, DM, A-fib, GERD, AAA, Asthma, HLD, Prostate, Lung (now recurrent)  Independent Review of Additional Tests or Records:  PCP notes and CBC reviewed - Hgb 13, Plt 42 (03/2023) PET 9/27: no hypermetabolic lesions in OP or upper airway or nasal cavity; no significant sinonasal disease - well aerated CTA 12/2022: extensive pneumatization including around ant skull base but no sinonasal opacification or masses noted; sinuous septum with large left septal spur  PMH/Meds/All/SocHx/FamHx/ROS:   Past Medical History:  Diagnosis Date   AAA (abdominal aortic aneurysm) (HCC)    Anxiety    Arthritis    Asthma    when younger   Atrial fibrillation (HCC)    CAD (coronary artery disease)    Cataract    removed both   COPD (chronic obstructive pulmonary disease) (HCC)    Diabetes mellitus     Type II   Dysrhythmia    afib   Esophageal reflux    Family history of malignant neoplasm of gastrointestinal tract    Heart murmur    Hiatal hernia    Hyperlipemia    Hypertension    Lung nodule    a. PET scan highly suspicious for malignancy. Bronch with biopsy to be done after TAVR   Malignant neoplasm of prostate (HCC)    prostate    Peripheral vascular disease (HCC)    Pneumonia    Stricture and stenosis of esophagus      Past Surgical History:  Procedure Laterality Date   ABDOMINAL AORTA STENT     CARDIAC CATHETERIZATION     CARDIOVERSION N/A 12/04/2015   Procedure: CARDIOVERSION;  Surgeon: Thurmon Fair, MD;  Location: [facility];  Service: Cardiovascular;  Laterality: N/A;   COLONOSCOPY     FINGER SURGERY Right    middle finger- amputation   INSERTION PROSTATE RADIATION SEED     MEDIASTINOSCOPY N/A 01/06/2019   Procedure: MEDIASTINOSCOPY WITH BIOPIES;  Surgeon: Alleen Borne, MD;  Location: [facility] OR;  Service: Thoracic;  Laterality: N/A;   RIGHT/LEFT HEART CATH AND CORONARY ANGIOGRAPHY N/A 10/26/2018   Procedure: RIGHT/LEFT HEART CATH AND CORONARY ANGIOGRAPHY;  Surgeon: Kathleene Hazel, MD;  Location: [facility]VAS[facility] CV LAB;  Service: Cardiovascular;  Laterality: N/A;   TEE WITHOUT CARDIOVERSION N/A 12/06/2018   Procedure: TRANSESOPHAGEAL ECHOCARDIOGRAM (TEE);  Surgeon: Kathleene Hazel, MD;  Location: [facility] INVASIVE CV LAB;  Service: Open Heart Surgery;  Laterality: N/A;   TRANSCATHETER AORTIC VALVE  REPLACEMENT, TRANSFEMORAL N/A 12/06/2018   Procedure: TRANSCATHETER AORTIC VALVE REPLACEMENT, TRANSFEMORAL;  Surgeon: Kathleene Hazel, MD;  Location: [facility]VAS[facility] CV LAB;  Service: Open Heart Surgery;  Laterality: N/A;   UPPER GASTROINTESTINAL ENDOSCOPY     VIDEO ASSISTED THORACOSCOPY (VATS)/THOROCOTOMY Left 02/02/2019   Procedure: VIDEO ASSISTED THORACOSCOPY (VATS)/THOROCOTOMY;  Surgeon: Alleen Borne, MD;  Location: [facility] OR;  Service: Thoracic;  Laterality: Left;    VIDEO BRONCHOSCOPY N/A 01/06/2019   Procedure: VIDEO BRONCHOSCOPY;  Surgeon: Alleen Borne, MD;  Location: [facility] OR;  Service: Thoracic;  Laterality: N/A;   VIDEO BRONCHOSCOPY WITH ENDOBRONCHIAL ULTRASOUND N/A 03/07/2021   Procedure: VIDEO BRONCHOSCOPY WITH ENDOBRONCHIAL ULTRASOUND;  Surgeon: Loreli Slot, MD;  Location: [facility] OR;  Service: Thoracic;  Laterality: N/A;   WEDGE RESECTION Left 02/02/2019   Procedure: LUNG RESECTION;  Surgeon: Alleen Borne, MD;  Location: [facility];  Service: Thoracic;  Laterality: Left;    Family History  Problem Relation Age of Onset   Colon cancer Father        questionable   Diabetes Mother    Heart disease Mother        Heart Disease before age 61   Deep vein thrombosis Mother    Hyperlipidemia Mother    Hypertension Mother    Varicose Veins Mother    Hypertension Brother    Heart attack Brother    Prostate cancer Brother    Colon polyps Neg Hx    Esophageal cancer Neg Hx    Rectal cancer Neg Hx    Stomach cancer Neg Hx      Social Connections: Not on file     Current Outpatient Medications:    ACCU-CHEK GUIDE test strip, USE TO CHECK YOUR BLOOD SUGAR ONCE DAILY, Disp: , Rfl:    acetaminophen (TYLENOL) 500 MG tablet, Take 2 tablets (1,000 mg total) by mouth every 6 (six) hours as needed for mild pain or fever., Disp: 30 tablet, Rfl: 0   diltiazem (CARDIZEM SR) 60 MG 12 hr capsule, Take 1 capsule (60 mg total) by mouth every 12 (twelve) hours., Disp: 60 capsule, Rfl: 7   dorzolamide-timolol (COSOPT) 22.3-6.8 MG/ML ophthalmic solution, Place 1 drop into both eyes in the morning, at noon, and at bedtime., Disp: , Rfl:    glimepiride (AMARYL) 2 MG tablet, Take 2 mg by mouth daily with breakfast., Disp: , Rfl:    JARDIANCE 25 MG TABS tablet, Take 25 mg by mouth daily., Disp: , Rfl:    metFORMIN (GLUCOPHAGE-XR) 500 MG 24 hr tablet, Take 1,000 mg by mouth every evening. , Disp: , Rfl:    methylPREDNISolone (MEDROL DOSEPAK) 4 MG TBPK tablet, Use as  instructed, Disp: 21 tablet, Rfl: 0   metoprolol succinate (TOPROL-XL) 25 MG 24 hr tablet, Take 50 mg by mouth daily., Disp: , Rfl:    mirtazapine (REMERON) 15 MG tablet, Take 1 tablet (15 mg total) by mouth at bedtime., Disp: 30 tablet, Rfl: 2   Multiple Vitamins-Minerals (MULTIVITAMIN WITH MINERALS) tablet, Take 1 tablet by mouth daily., Disp: , Rfl:    Multiple Vitamins-Minerals (ZINC PO), Take 1 tablet by mouth daily., Disp: , Rfl:    mupirocin ointment (BACTROBAN) 2 %, Apply 1 Application topically 2 (two) times daily for 10 days. Apply a pea sized amount to the nostril twice per day for 10 days., Disp: 22 g, Rfl: 0   osimertinib mesylate (TAGRISSO) 80 MG tablet, Take 1 tablet (80 mg total) by mouth daily., Disp: 30 tablet, Rfl:  3   prochlorperazine (COMPAZINE) 10 MG tablet, Take 1 tablet (10 mg total) by mouth every 6 (six) hours as needed for nausea or vomiting., Disp: 30 tablet, Rfl: 0   saline (AYR) GEL, Place 1 Application into both nostrils every 4 (four) hours as needed., Disp: 14 g, Rfl: 3   simvastatin (ZOCOR) 40 MG tablet, Take 40 mg by mouth at bedtime., Disp: , Rfl:    XARELTO 20 MG TABS tablet, TAKE 1 TABLET BY MOUTH  DAILY WITH SUPPER, Disp: 90 tablet, Rfl: 1   Physical Exam:   Ht 5\' 9"  (1.753 m)   Wt 148 lb (67.1 kg)   BMI 21.86 kg/m    Salient findings:  CN II-XII intact Bilateral EAC clear and TM intact with well pneumatized middle ear spaces Anterior rhinoscopy: Septum with massive caudal deviation left; bilateral inferior turbinates without significant hypertrophy; nasal endoscopy performed to better evaluate nasal cavity due to chief complaint of epistaxis No lesions of oral cavity/oropharynx; no telangiectasias or bleeding No obviously palpable neck masses/lymphadenopathy/thyromegaly No respiratory distress or stridor  Procedures:  PROCEDURE: Bilateral Rigid Nasal Endoscopy with control of epistaxis right (simple) - CPT 60454 Pre-procedure diagnosis:  Epistaxis Post-procedure diagnosis: same Indication: See pre-procedure diagnosis and physical exam above Complications: None apparent EBL: 0 mL Anesthesia: Lidocaine 4% and topical decongestant was topically sprayed in each nasal cavity  Description of Procedure:  Patient was identified. A rigid 0 degree endoscope was utilized to evaluate the sinonasal cavities, mucosa, sinus ostia and turbinates and septum.  Prominent septal vessels over Kisselbach plexus on right were cauterized using silver nitrate after local anesthesia as above administered and allowing it 10 minutes to work. No bleeding was noted. Mupirocin ointment applied.afterwards   Impression & Plans:  Tyr Pearce is a 82 y.o. male with h/o Lung cancer and cardiac comorbidities on xarelto with: Intermittent right sided epistaxis on anticoagulation - Endo w/ b/l prominent anterior septal vessels but bleeds happening on right - cauterized today  - humidifier; ayr gel QID PRN, especially at night - Mupirocin 2% ointment BID to nostril (technique shown) x10d - Epistaxis precautions discussed - Ok to continue anticoagulation from my standpoint - f/u 6 weeks; some bleeding to be expected (keep track regarding frequency)  I have personally spent 34 minutes involved in face-to-face and non-face-to-face activities for this patient on the day of the visit.  Professional time spent includes the following activities, in addition to those noted in the documentation: preparing to see the patient (review of outside documentation), performing a medically appropriate examination and/or evaluation, counseling and educating the patient/family/caregiver, ordering medications, performing procedures (cauterization) referring and communicating with other healthcare professionals, documenting clinical information in the electronic or other health record, independently interpreting results and communicating results with the  patient/family/caregiver   Thank you for allowing me the opportunity to care for your patient. Please do not hesitate to contact me should you have any other questions.  Sincerely, Jovita Kussmaul, MD Otolarynoglogist (ENT), [facility] Phone: [phone] Fax: [phone]  03/25/2023, 12:57 PM

## 2023-03-26 NOTE — Progress Notes (Unsigned)
[facility] OFFICE PROGRESS NOTE  Vincent Dills, MD [address] [place] [address]  DIAGNOSIS: Recurrent/metastatic non-small cell lung cancer, adenocarcinoma initially diagnosed as a stage Ib (T2 a, N0, M0) adenocarcinoma in September 2020 status post wedge resection of the left upper lobe nodule.  The patient has disease recurrence and September 2022 with metastatic mediastinal lymphadenopathy as well as left hilar and left pleural metastatic disease    PD-L1 expression was negative.   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  First dose April 30, 2021. Status post 2 cycles. Discontinued due to molecular studies positive for EGFR mutation 3) Tarceva 150 mg p.o. daily started June 28, 2021.  Status post 2 month of treatment.  His dose was changed to Tarceva 100 mg p.o. daily on 09/04/2021 secondary to intolerance and frequent skin rash.  Status post  months of treatment  4)  Tarceva 150 mg p.o. daily started June 28, 2021.  Status post 2 month of treatment.  His dose was changed to Tarceva 100 mg p.o. daily on 09/04/2021 secondary to intolerance and frequent skin rash.  Status post 20  months of treatment  Discontinued due to disease progression  CURRENT THERAPY:Target treatment with To treatment with Tagrisso 80 mg p.o. daily.  First dose on ~03/05/23   INTERVAL HISTORY: Vincent Velez 82 y.o. male returns to the clinic today for follow-up visit.  The patient was last seen 2 weeks ago by myself.  The patient is accompanied by his daughter today.  In summary the patient was found to have evidence of disease progression in  early October 2024.  Therefore treatment was changed to Tagrisso.  He started this around 10//24 and he is thus far been tolerating it well.  He was struggling with more rashes and diarrhea with his prior treatment with Tarceva.  Rashes and diarrhea at this time?  He also had been having some ongoing issues with epistaxis for several months.  He did establish care with ENT on 03/19/23.  He then presented to the ER on 03/20/2023 with epistaxis.  He was given Afrin.  He has a prominent anterior septal vessel but bleeding happens on the right.  This area was cauterized on 03/25/23.  He was given recommendations for humidifier at night and Bactroban 2% ointment twice daily for 10 days.  Regarding his energy, he reports he has some good days and bad days. Sometimes he may feel good and overdo it and cause some fatigue the following days. He denies any fever, chills, or night sweats. His weight is about stable. He is drinking on average 3 protein supplemental drinks per day. Sometimes foods do not taste the way they are supposed to, more commonly with meats/potatoes. He reports stable dyspnea on exertion.  He may feel short of breath with walking distances.  With his normal activities of daily living, he states his breathing is "fine".  He denies any significant cough.  He may occasionally cough up some phlegm.  Denies any chest pain. Besides the epistaxis, he denies any hematuria, melena, or hematochezia. Denies any nausea, vomiting, or recent diarrhea. Denies any headache or visual changes.  He is here today for evaluation and repeat blood work and to manage any adverse side effects of Tagrisso.       MEDICAL HISTORY: Past Medical History:  Diagnosis Date   AAA (abdominal aortic aneurysm) (HCC)    Anxiety    Arthritis    Asthma    when younger   Atrial fibrillation (HCC)    CAD (coronary artery disease)    Cataract    removed both   COPD (chronic obstructive pulmonary disease) (HCC)    Diabetes  mellitus    Type II   Dysrhythmia    afib   Esophageal reflux    Family history of malignant neoplasm of gastrointestinal tract    Heart murmur    Hiatal hernia    Hyperlipemia    Hypertension    Lung nodule    a. PET scan highly suspicious for malignancy. Bronch with biopsy to be done after TAVR   Malignant neoplasm of prostate [facility])    prostate    Peripheral vascular disease (HCC)    Pneumonia    Stricture and stenosis of esophagus     ALLERGIES:  is allergic to macrolides and ketolides.  MEDICATIONS:  Current Outpatient Medications  Medication Sig Dispense Refill   ACCU-CHEK GUIDE test strip USE TO CHECK YOUR BLOOD SUGAR ONCE DAILY     acetaminophen (TYLENOL) 500 MG tablet Take 2 tablets (1,000 mg total) by mouth every 6 (six) hours as needed for mild pain or fever. 30 tablet 0   diltiazem (CARDIZEM SR) 60 MG 12 hr capsule Take 1 capsule (60 mg total) by mouth every 12 (twelve) hours. 60 capsule 7   dorzolamide-timolol (COSOPT) 22.3-6.8 MG/ML ophthalmic solution Place 1 drop into both eyes in the morning, at noon, and at bedtime.     glimepiride (AMARYL) 2 MG tablet Take 2 mg by mouth daily with breakfast.     JARDIANCE 25 MG TABS tablet Take 25 mg by mouth daily.     metFORMIN (GLUCOPHAGE-XR) 500 MG 24 hr tablet Take 1,000 mg by mouth every evening.      methylPREDNISolone (MEDROL DOSEPAK) 4 MG TBPK tablet Use as instructed 21 tablet 0   metoprolol succinate (TOPROL-XL) 25 MG 24 hr tablet Take 50 mg by mouth daily.     mirtazapine (REMERON) 15 MG tablet Take 1 tablet (15 mg total) by mouth at bedtime. 30 tablet 2   Multiple Vitamins-Minerals (MULTIVITAMIN WITH MINERALS) tablet Take 1 tablet by mouth daily.     Multiple Vitamins-Minerals (ZINC PO) Take 1 tablet by mouth daily.     mupirocin ointment (BACTROBAN) 2 % Apply 1 Application topically 2 (two) times daily for 10 days. Apply a pea sized amount to the nostril twice per day for 10 days. 22 g 0   osimertinib mesylate  (TAGRISSO) 80 MG tablet Take 1 tablet (80 mg total) by mouth daily. 30 tablet 3   prochlorperazine (COMPAZINE) 10 MG tablet Take 1 tablet (10 mg total) by mouth every 6 (six) hours as needed for nausea or vomiting. 30 tablet 0   saline (AYR) GEL Place 1 Application into both nostrils every 4 (four) hours as needed. 14 g 3   simvastatin (ZOCOR) 40 MG tablet Take 40 mg by mouth at bedtime.     XARELTO 20 MG TABS tablet TAKE 1 TABLET BY MOUTH  DAILY WITH SUPPER 90 tablet 1   No current facility-administered medications for this visit.    SURGICAL HISTORY:  Past Surgical History:  Procedure  Laterality Date   ABDOMINAL AORTA STENT     CARDIAC CATHETERIZATION     CARDIOVERSION N/A 12/04/2015   Procedure: CARDIOVERSION;  Surgeon: Thurmon Fair, MD;  Location: [facility] ENDOSCOPY;  Service: Cardiovascular;  Laterality: N/A;   COLONOSCOPY     FINGER SURGERY Right    middle finger- amputation   INSERTION PROSTATE RADIATION SEED     MEDIASTINOSCOPY N/A 01/06/2019   Procedure: MEDIASTINOSCOPY WITH BIOPIES;  Surgeon: Alleen Borne, MD;  Location: [facility] OR;  Service: Thoracic;  Laterality: N/A;   RIGHT/LEFT HEART CATH AND CORONARY ANGIOGRAPHY N/A 10/26/2018   Procedure: RIGHT/LEFT HEART CATH AND CORONARY ANGIOGRAPHY;  Surgeon: Kathleene Hazel, MD;  Location: [facility] INVASIVE CV LAB;  Service: Cardiovascular;  Laterality: N/A;   TEE WITHOUT CARDIOVERSION N/A 12/06/2018   Procedure: TRANSESOPHAGEAL ECHOCARDIOGRAM (TEE);  Surgeon: Kathleene Hazel, MD;  Location: [facility] INVASIVE CV LAB;  Service: Open Heart Surgery;  Laterality: N/A;   TRANSCATHETER AORTIC VALVE REPLACEMENT, TRANSFEMORAL N/A 12/06/2018   Procedure: TRANSCATHETER AORTIC VALVE REPLACEMENT, TRANSFEMORAL;  Surgeon: Kathleene Hazel, MD;  Location: [facility] INVASIVE CV LAB;  Service: Open Heart Surgery;  Laterality: N/A;   UPPER GASTROINTESTINAL ENDOSCOPY     VIDEO ASSISTED THORACOSCOPY (VATS)/THOROCOTOMY Left 02/02/2019   Procedure: VIDEO ASSISTED  THORACOSCOPY (VATS)/THOROCOTOMY;  Surgeon: Alleen Borne, MD;  Location: [facility] OR;  Service: Thoracic;  Laterality: Left;   VIDEO BRONCHOSCOPY N/A 01/06/2019   Procedure: VIDEO BRONCHOSCOPY;  Surgeon: Alleen Borne, MD;  Location: [facility] OR;  Service: Thoracic;  Laterality: N/A;   VIDEO BRONCHOSCOPY WITH ENDOBRONCHIAL ULTRASOUND N/A 03/07/2021   Procedure: VIDEO BRONCHOSCOPY WITH ENDOBRONCHIAL ULTRASOUND;  Surgeon: Loreli Slot, MD;  Location: [facility] OR;  Service: Thoracic;  Laterality: N/A;   WEDGE RESECTION Left 02/02/2019   Procedure: LUNG RESECTION;  Surgeon: Alleen Borne, MD;  Location: [facility] OR;  Service: Thoracic;  Laterality: Left;    REVIEW OF SYSTEMS:   Review of Systems  Constitutional: Negative for appetite change, chills, fatigue, fever and unexpected weight change.  HENT:   Negative for mouth sores, nosebleeds, sore throat and trouble swallowing.   Eyes: Negative for eye problems and icterus.  Respiratory: Negative for cough, hemoptysis, shortness of breath and wheezing.   Cardiovascular: Negative for chest pain and leg swelling.  Gastrointestinal: Negative for abdominal pain, constipation, diarrhea, nausea and vomiting.  Genitourinary: Negative for bladder incontinence, difficulty urinating, dysuria, frequency and hematuria.   Musculoskeletal: Negative for back pain, gait problem, neck pain and neck stiffness.  Skin: Negative for itching and rash.  Neurological: Negative for dizziness, extremity weakness, gait problem, headaches, light-headedness and seizures.  Hematological: Negative for adenopathy. Does not bruise/bleed easily.  Psychiatric/Behavioral: Negative for confusion, depression and sleep disturbance. The patient is not nervous/anxious.     PHYSICAL EXAMINATION:  There were no vitals taken for this visit.  ECOG PERFORMANCE STATUS: {CHL ONC ECOG [id number]  Physical Exam  Constitutional: Oriented to person, place, and time and well-developed, well-nourished,  and in no distress. No distress.  HENT:  Head: Normocephalic and atraumatic.  Mouth/Throat: Oropharynx is clear and moist. No oropharyngeal exudate.  Eyes: Conjunctivae are normal. Right eye exhibits no discharge. Left eye exhibits no discharge. No scleral icterus.  Neck: Normal range of motion. Neck supple.  Cardiovascular: Normal rate, regular rhythm, normal heart sounds and intact distal pulses.   Pulmonary/Chest: Effort normal and breath sounds normal. No respiratory distress. No wheezes. No rales.  Abdominal: Soft. Bowel sounds are normal. Exhibits no distension and no mass.  There is no tenderness.  Musculoskeletal: Normal range of motion. Exhibits no edema.  Lymphadenopathy:    No cervical adenopathy.  Neurological: Alert and oriented to person, place, and time. Exhibits normal muscle tone. Gait normal. Coordination normal.  Skin: Skin is warm and dry. No rash noted. Not diaphoretic. No erythema. No pallor.  Psychiatric: Mood, memory and judgment normal.  Vitals reviewed.  LABORATORY DATA: Lab Results  Component Value Date   WBC 6.9 03/17/2023   HGB 13.2 03/17/2023   HCT 39.5 03/17/2023   MCV 91.6 03/17/2023   PLT 42 (L) 03/17/2023      Chemistry      Component Value Date/Time   NA 135 03/17/2023 1445   NA 138 12/06/2019 1438   K 4.4 03/17/2023 1445   CL 102 03/17/2023 1445   CO2 26 03/17/2023 1445   BUN 32 (H) 03/17/2023 1445   BUN 14 12/06/2019 1438   CREATININE 1.03 03/17/2023 1445   CREATININE 0.81 11/29/2015 1335      Component Value Date/Time   CALCIUM 10.1 03/17/2023 1445   ALKPHOS 91 03/17/2023 1445   AST 24 03/17/2023 1445   ALT 15 03/17/2023 1445   BILITOT 0.8 03/17/2023 1445       RADIOGRAPHIC STUDIES:  NM PET Image Restage (PS) Skull Base to Thigh (F-18 FDG)  Result Date: 03/03/2023 CLINICAL DATA:  Subsequent treatment strategy for non-small cell lung cancer. EXAM: NUCLEAR MEDICINE PET SKULL BASE TO THIGH TECHNIQUE: 7.74 mCi F-18 FDG was  injected intravenously. Full-ring PET imaging was performed from the skull base to thigh after the radiotracer. CT data was obtained and used for attenuation correction and anatomic localization. Fasting blood glucose: 103 mg/dl COMPARISON:  PET-CT 62/95/2841. CT of the chest, abdomen and pelvis 01/20/2023. FINDINGS: Mediastinal blood pool activity: SUV max 1.6 NECK: No hypermetabolic cervical lymph nodes are identified.Fairly symmetric activity within the lymphoid tissue of Waldeyer's ring is within physiologic limits. No suspicious activity identified within the pharyngeal mucosal space. Incidental CT findings: Bilateral carotid atherosclerosis. CHEST: There are small hypermetabolic left hilar and subcarinal lymph nodes. 1.3 cm subcarinal node on image 73/4 has an SUV max of 4.1. The individual hypermetabolic left hilar nodes are not well delineated on the CT images, although there is a node along the inferior aspect of the left hilum with an SUV max of 4.7. The increased asymmetric left-sided pleural thickening seen on recent CT is associated with mild hypermetabolic activity, greatest laterally demonstrating an SUV max of 4.1. Some of the hilar activity on the left may be due to adjacent pleural disease. No dominant lung mass or suspicious central hypermetabolic pulmonary activity. Incidental CT findings: Severe pulmonary fibrosis with probable superimposed dependent atelectasis in both lungs. Diffuse atherosclerosis of the aorta, great vessels and coronary arteries post TAVR procedure. The heart is mildly enlarged. ABDOMEN/PELVIS: There is no hypermetabolic activity within the liver, adrenal glands, spleen or pancreas. There is no hypermetabolic nodal activity in the abdomen or pelvis. Incidental CT findings: Unchanged 1.8 cm left adrenal nodule on image 107/4. 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 Diffuse aortic and branch vessel atherosclerosis post aorto bi-iliac endograft stenting. Prostate brachytherapy seeds noted. SKELETON: There is no hypermetabolic activity to suggest osseous metastatic disease. Incidental CT findings: Mild spondylosis. Unchanged coarse calcification in the subcutaneous fat of the inferior right buttocks, without hypermetabolic activity. IMPRESSION: 1.  The asymmetric left-sided pleural thickening seen on recent CT is associated with mild hypermetabolic activity, suspicious for pleural metastatic disease. 2. Mildly hypermetabolic subcarinal and left hilar lymph nodes are nonspecific and could be reactive or neoplastic. 3. No evidence of distant metastatic disease. 4. 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 5.  Aortic Atherosclerosis (ICD10-I70.0). Electronically Signed   By: Carey Bullocks M.D.   On: 03/03/2023 09:15     ASSESSMENT/PLAN:  This is a very pleasant 82 year old Caucasian male diagnosed with recurrent/metastatic non-small cell lung cancer, adenocarcinoma that was initially diagnosed as stage Ib (T2 a, N0, M0) adenocarcinoma in September 2020 status post wedge resection of the left lower lobe lung nodule and he had recurrence in September 2022 presented with metastatic mediastinal lymphadenopathy in addition to left hilar and left pleural metastatic disease.  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    He underwent 2 cycles of systemic palliative chemotherapy with carboplatin AUC of 5 and Alimta 500 mg per metered squared and Keytruda 200 mg IV every 3 weeks.  He received 2 cycles.  This was discontinued due to receiving the results of his molecular studies which show EGFR mutation.   He was started on treatment with Tarceva 150 mg p.o. daily on 06/28/2021.  He was not a good candidate for Tagrisso because of his prolonged QT and concern about  arrhythmia while on Tagrisso. ***   His dose was reduced to 100 mg p.o. daily of Tarceva. His dose was reduced in April 2023. He was on tarceva for about 20 months.    He had evidence of disease progression on PET scan in September 2024.    His treatment with Tarceva was discontinued and he was started on Tagrisso 80 mg p.o. daily on ~03/05/23. He thus far is tolerating this well.    Labs were reviewed. Recommend that he ***   We will see him back in 4 weeks with a restaging CT scan.   ENT and nose bleeds.   We will repeat EKG today to monitor his Qtc.       No orders of the defined types were placed in this encounter.    I spent {CHL ONC TIME VISIT - CBJSE:[id number]} counseling the patient face to face. The total time spent in the appointment was {CHL ONC TIME VISIT - VPXTG:[id number]}.  Jamee Pacholski L Stanley Helmuth, PA-C 03/26/23

## 2023-03-30 ENCOUNTER — Other Ambulatory Visit: Payer: Self-pay | Admitting: Physician Assistant

## 2023-03-30 ENCOUNTER — Inpatient Hospital Stay: Payer: Medicare Other

## 2023-03-30 ENCOUNTER — Other Ambulatory Visit (HOSPITAL_COMMUNITY): Payer: Self-pay

## 2023-03-30 ENCOUNTER — Inpatient Hospital Stay: Payer: Medicare Other | Admitting: Physician Assistant

## 2023-03-30 VITALS — BP 106/61 | HR 61 | Resp 13 | Wt 145.7 lb

## 2023-03-30 DIAGNOSIS — C3492 Malignant neoplasm of unspecified part of left bronchus or lung: Secondary | ICD-10-CM | POA: Diagnosis not present

## 2023-03-30 DIAGNOSIS — D696 Thrombocytopenia, unspecified: Secondary | ICD-10-CM

## 2023-03-30 DIAGNOSIS — C3412 Malignant neoplasm of upper lobe, left bronchus or lung: Secondary | ICD-10-CM | POA: Diagnosis not present

## 2023-03-30 DIAGNOSIS — C3490 Malignant neoplasm of unspecified part of unspecified bronchus or lung: Secondary | ICD-10-CM

## 2023-03-30 LAB — CBC WITH DIFFERENTIAL (CANCER CENTER ONLY)
Abs Immature Granulocytes: 0.11 10*3/uL — ABNORMAL HIGH (ref 0.00–0.07)
Basophils Absolute: 0 10*3/uL (ref 0.0–0.1)
Basophils Relative: 0 %
Eosinophils Absolute: 0.2 10*3/uL (ref 0.0–0.5)
Eosinophils Relative: 3 %
HCT: 41.2 % (ref 39.0–52.0)
Hemoglobin: 13.5 g/dL (ref 13.0–17.0)
Immature Granulocytes: 2 %
Lymphocytes Relative: 19 %
Lymphs Abs: 1.5 10*3/uL (ref 0.7–4.0)
MCH: 30.3 pg (ref 26.0–34.0)
MCHC: 32.8 g/dL (ref 30.0–36.0)
MCV: 92.6 fL (ref 80.0–100.0)
Monocytes Absolute: 0.8 10*3/uL (ref 0.1–1.0)
Monocytes Relative: 11 %
Neutro Abs: 4.9 10*3/uL (ref 1.7–7.7)
Neutrophils Relative %: 65 %
Platelet Count: 38 10*3/uL — ABNORMAL LOW (ref 150–400)
RBC: 4.45 MIL/uL (ref 4.22–5.81)
RDW: 15.9 % — ABNORMAL HIGH (ref 11.5–15.5)
WBC Count: 7.5 10*3/uL (ref 4.0–10.5)
nRBC: 0 % (ref 0.0–0.2)

## 2023-03-30 LAB — CMP (CANCER CENTER ONLY)
ALT: 32 U/L (ref 0–44)
AST: 25 U/L (ref 15–41)
Albumin: 3.9 g/dL (ref 3.5–5.0)
Alkaline Phosphatase: 81 U/L (ref 38–126)
Anion gap: 6 (ref 5–15)
BUN: 31 mg/dL — ABNORMAL HIGH (ref 8–23)
CO2: 28 mmol/L (ref 22–32)
Calcium: 9.7 mg/dL (ref 8.9–10.3)
Chloride: 102 mmol/L (ref 98–111)
Creatinine: 1.06 mg/dL (ref 0.61–1.24)
GFR, Estimated: >60 mL/min (ref >60)
Glucose, Bld: 148 mg/dL — ABNORMAL HIGH (ref 70–99)
Potassium: 4.2 mmol/L (ref 3.5–5.1)
Sodium: 136 mmol/L (ref 135–145)
Total Bilirubin: 0.6 mg/dL (ref 0.3–1.2)
Total Protein: 7.3 g/dL (ref 6.5–8.1)

## 2023-03-30 MED ORDER — PREDNISONE 10 MG PO TABS
10.0000 mg | ORAL_TABLET | Freq: Every day | ORAL | 0 refills | Status: DC
Start: 2023-03-30 — End: 2023-04-14

## 2023-03-30 NOTE — Progress Notes (Signed)
Specialty Pharmacy Refill Coordination Note  I spoke to [place], regarding her father, Vincent Velez, a 82 y.o. male contacted today regarding refills of specialty medication(s) Osimertinib Mesylate   Patient requested Delivery   Delivery date: 04/02/23   Verified address: Patient address [address]  [place] [address]   Medication will be filled on 04/01/23.

## 2023-03-30 NOTE — Progress Notes (Signed)
Specialty Pharmacy Ongoing Clinical Assessment Note  I spoke to [place], regarding her father, Vincent Velez a 82 y.o. male who is being followed by the specialty pharmacy service for [facility] Oncology   Patient's specialty medication(s) reviewed today: Osimertinib Mesylate   Missed doses in the last 4 weeks: 0   Patient/Caregiver did not have any additional questions or concerns.   Therapeutic benefit summary: Patient is achieving benefit   Adverse events/side effects summary: No adverse events/side effects   Patient's therapy is appropriate to: Continue    Goals Addressed             This Visit's Progress    Improve or maintain quality of life   No change    Patient is initiating therapy. Patient will maintain adherence        Follow up:  3 months  Servando Snare Specialty Pharmacist

## 2023-04-09 ENCOUNTER — Ambulatory Visit (INDEPENDENT_AMBULATORY_CARE_PROVIDER_SITE_OTHER): Payer: Medicare Other

## 2023-04-11 NOTE — Progress Notes (Unsigned)
[facility] OFFICE PROGRESS NOTE  [name], MD [address] [place] [address]  DIAGNOSIS: Recurrent/metastatic non-small cell lung cancer, adenocarcinoma initially diagnosed as a stage Ib (T2 a, N0, M0) adenocarcinoma in September 2020 status post wedge resection of the left upper lobe nodule.  The patient has disease recurrence and September 2022 with metastatic mediastinal lymphadenopathy as well as left hilar and left pleural metastatic disease    PD-L1 expression was negative.   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  First dose April 30, 2021. Status post 2 cycles. Discontinued due to molecular studies positive for EGFR mutation 3) Tarceva 150 mg p.o. daily started June 28, 2021.  Status post 2 month of treatment.  His dose was changed to Tarceva 100 mg p.o. daily on 09/04/2021 secondary to intolerance and frequent skin rash.  Status post  months of treatment  4)  Tarceva 150 mg p.o. daily started June 28, 2021.  Status post 2 month of treatment.  His dose was changed to Tarceva 100 mg p.o. daily on 09/04/2021 secondary to intolerance and frequent skin rash.  Status post 20  months of treatment  Discontinued due to disease progression    CURRENT THERAPY: Target treatment with to treatment with Tagrisso 80 mg p.o. daily.  First dose on ~03/05/23.   INTERVAL HISTORY: Vincent Velez 82 y.o. male returns to the clinic today for a follow-up visit accompanied by his daughter  The patient was last seen 2 weeks ago by myself.  In summary the patient was found to have evidence of disease progression in early October  2024.  Therefore, treatment was changed from Tarceva to Tagrisso.  He started this around 03/05/23 and he is thus far been tolerating it well except thrombocytopenia  He was struggling with more rashes and diarrhea with his prior treatment with Tarceva. He denies rashes or diarrhea at this time with Tagrisso. However, he is having worsening thrombocytopenia with the Tagrisso. He was given a medrol dose pack. Then at this last appointment on 03/30/23, he was given low dose daily steroids. He does have diabetes and has been monitoring his blood sugar closely, which has been fairly well controlled.   He previously, he had ongoing issues with epistaxis for which he sees ENT.  Of note, the patient had epistaxis even prior to the thrombocytopenia and progressive.  He has a prominent anterior septal vessel but bleeding happens on the right. This area was cauterized on 03/25/23.  He was given recommendations for humidifier at night and Bactroban 2% ointment twice daily for 10 days.  Since his last appointment, he did have 1 significant nosebleed first thing in the morning.  He denies any other abnormal bleeding or bruising besides the epistaxis, for example he denies any gingival bleeding, hemoptysis, hematemesis, melena, or hematochezia.  Denies any hematuria.  He denies any fever, chills, or night sweats. His weight is stable. He previously reported he is drinking on average 3 protein supplemental drinks per day.  He reports stable dyspnea on exertion. He does not cough a lot. Denies any chest pain.si Denies any nausea, vomiting, constipation, or recent notable diarrhea.  Did  have a mild left-sided headache.  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  He is not established with a neurologist.  He does continue to have intermittent dizziness.  He is not use any assistive devices but would be open to having a  walker at home if needed.  His daughter feels like he is terminally mildly weaker than prior and has some concerns about falling at home.  She is also needing another handicap placard form for the patient.   He is here today for evaluation and repeat blood work and to manage any adverse side effects of Tagrisso. His next restaging CT scan is expected later this month.      MEDICAL HISTORY: Past Medical History:  Diagnosis Date   AAA (abdominal aortic aneurysm) (HCC)    Anxiety    Arthritis    Asthma    when younger   Atrial fibrillation (HCC)    CAD (coronary artery disease)    Cataract    removed both   COPD (chronic obstructive pulmonary disease) (HCC)    Diabetes mellitus    Type II   Dysrhythmia    afib   Esophageal reflux    Family history of malignant neoplasm of gastrointestinal tract    Heart murmur    Hiatal hernia    Hyperlipemia    Hypertension    Lung nodule    a. PET scan highly suspicious for malignancy. Bronch with biopsy to be done after TAVR   Malignant neoplasm of prostate [facility])    prostate    Peripheral vascular disease (HCC)    Pneumonia    Stricture and stenosis of esophagus     ALLERGIES:  is allergic to macrolides and ketolides.  MEDICATIONS:  Current Outpatient Medications  Medication Sig Dispense Refill   ACCU-CHEK GUIDE test strip USE TO CHECK YOUR BLOOD SUGAR ONCE DAILY     acetaminophen (TYLENOL) 500 MG tablet Take 2 tablets (1,000 mg total) by mouth every 6 (six) hours as needed for mild pain or fever. 30 tablet 0   diltiazem (CARDIZEM SR) 60 MG 12 hr capsule Take 1 capsule (60 mg total) by mouth every 12 (twelve) hours. 60 capsule 7   dorzolamide-timolol (COSOPT) 22.3-6.8 MG/ML ophthalmic solution Place 1 drop into both eyes in the morning, at noon, and at bedtime.     glimepiride (AMARYL) 2 MG tablet Take 2 mg by mouth daily with breakfast.     JARDIANCE 25 MG TABS tablet Take 25 mg by mouth daily.     metFORMIN (GLUCOPHAGE-XR) 500 MG  24 hr tablet Take 1,000 mg by mouth every evening.      metoprolol succinate (TOPROL-XL) 25 MG 24 hr tablet Take 50 mg by mouth daily.     mirtazapine (REMERON) 15 MG tablet Take 1 tablet (15 mg total) by mouth at bedtime. 30 tablet 2   Multiple Vitamins-Minerals (MULTIVITAMIN WITH MINERALS) tablet Take 1 tablet by mouth daily.     Multiple Vitamins-Minerals (ZINC PO) Take 1 tablet by mouth daily.     osimertinib mesylate (TAGRISSO) 80 MG tablet Take 1 tablet (80 mg total) by mouth daily. 30 tablet 3   predniSONE (DELTASONE) 10 MG tablet Take 1 tablet (10 mg total) by mouth daily with breakfast. 16 tablet 0   prochlorperazine (COMPAZINE) 10 MG tablet Take 1 tablet (10 mg total) by mouth every 6 (six)  hours as needed for nausea or vomiting. 30 tablet 0   saline (AYR) GEL Place 1 Application into both nostrils every 4 (four) hours as needed. 14 g 3   simvastatin (ZOCOR) 40 MG tablet Take 40 mg by mouth at bedtime.     XARELTO 20 MG TABS tablet TAKE 1 TABLET BY MOUTH  DAILY WITH SUPPER 90 tablet 1   No current facility-administered medications for this visit.    SURGICAL HISTORY:  Past Surgical History:  Procedure Laterality Date   ABDOMINAL AORTA STENT     CARDIAC CATHETERIZATION     CARDIOVERSION N/A 12/04/2015   Procedure: CARDIOVERSION;  Surgeon: Thurmon Fair, MD;  Location: [facility];  Service: Cardiovascular;  Laterality: N/A;   COLONOSCOPY     FINGER SURGERY Right    middle finger- amputation   INSERTION PROSTATE RADIATION SEED     MEDIASTINOSCOPY N/A 01/06/2019   Procedure: MEDIASTINOSCOPY WITH BIOPIES;  Surgeon: Alleen Borne, MD;  Location: [facility] OR;  Service: Thoracic;  Laterality: N/A;   RIGHT/LEFT HEART CATH AND CORONARY ANGIOGRAPHY N/A 10/26/2018   Procedure: RIGHT/LEFT HEART CATH AND CORONARY ANGIOGRAPHY;  Surgeon: Kathleene Hazel, MD;  Location: [facility] INVASIVE CV LAB;  Service: Cardiovascular;  Laterality: N/A;   TEE WITHOUT CARDIOVERSION N/A 12/06/2018   Procedure:  TRANSESOPHAGEAL ECHOCARDIOGRAM (TEE);  Surgeon: Kathleene Hazel, MD;  Location: [facility]VASIVE CV LAB;  Service: Open Heart Surgery;  Laterality: N/A;   TRANSCATHETER AORTIC VALVE REPLACEMENT, TRANSFEMORAL N/A 12/06/2018   Procedure: TRANSCATHETER AORTIC VALVE REPLACEMENT, TRANSFEMORAL;  Surgeon: Kathleene Hazel, MD;  Location: [facility] INVASIVE CV LAB;  Service: Open Heart Surgery;  Laterality: N/A;   UPPER GASTROINTESTINAL ENDOSCOPY     VIDEO ASSISTED THORACOSCOPY (VATS)/THOROCOTOMY Left 02/02/2019   Procedure: VIDEO ASSISTED THORACOSCOPY (VATS)/THOROCOTOMY;  Surgeon: Alleen Borne, MD;  Location: [facility] OR;  Service: Thoracic;  Laterality: Left;   VIDEO BRONCHOSCOPY N/A 01/06/2019   Procedure: VIDEO BRONCHOSCOPY;  Surgeon: Alleen Borne, MD;  Location: [facility] OR;  Service: Thoracic;  Laterality: N/A;   VIDEO BRONCHOSCOPY WITH ENDOBRONCHIAL ULTRASOUND N/A 03/07/2021   Procedure: VIDEO BRONCHOSCOPY WITH ENDOBRONCHIAL ULTRASOUND;  Surgeon: Loreli Slot, MD;  Location[facility];  Service: Thoracic;  Laterality: N/A;   WEDGE RESECTION Left 02/02/2019   Procedure: LUNG RESECTION;  Surgeon: Alleen Borne, MD;  Location: [facility] OR;  Service: Thoracic;  Laterality: Left;    REVIEW OF SYSTEMS:   Review of Systems  Constitutional: Positive for fatigue. Negative for chills, fever and unexpected weight change.  HENT: Positive for episode of epistaxis. Negative for mouth sores, nosebleeds, sore throat and trouble swallowing.   Eyes: Negative for eye problems and icterus.  Respiratory: Positive for shortness of breath with certain activities. Negative for cough, hemoptysis, shortness of breath and wheezing.   Cardiovascular: Negative for chest pain and leg swelling.  Gastrointestinal: Negative for abdominal pain, constipation, diarrhea, nausea and vomiting.  Genitourinary: Negative for bladder incontinence, difficulty urinating, dysuria, frequency and hematuria.   Musculoskeletal: Negative for back pain,  gait problem, neck pain and neck stiffness.  Skin: Negative for itching and rash.  Neurological: Positive for dizziness. Negative for dizziness, extremity weakness, gait problem, headaches, light-headedness and seizures.  Hematological: Negative for adenopathy. Does not bruise/bleed easily.  Psychiatric/Behavioral: Negative for confusion, depression and sleep disturbance. The patient is not nervous/anxious.     PHYSICAL EXAMINATION:  There were no vitals taken for this visit.  ECOG PERFORMANCE STATUS: 1  Physical Exam  Constitutional: Oriented to person, place,  and time and elderly appearing male and in no distress.  HENT:  Head: Normocephalic and atraumatic.  Mouth/Throat: Oropharynx is clear and moist. No oropharyngeal exudate.  Eyes: Conjunctivae are normal. Right eye exhibits no discharge. Left eye exhibits no discharge. No scleral icterus.  Neck: Normal range of motion. Neck supple.  Cardiovascular: Normal rate, irregular rhythm, normal heart sounds and intact distal pulses.   Pulmonary/Chest: Effort normal and breath sounds normal. No respiratory distress. No wheezes. No rales.  Abdominal: Soft. Bowel sounds are normal. Exhibits no distension and no mass. There is no tenderness.  Musculoskeletal: Normal range of motion. Exhibits no edema.  Lymphadenopathy:    No cervical adenopathy.  Respiratory: Positive for shortness of breath with certain activities.  Skin: Skin is warm and dry. No rash noted. Not diaphoretic. No erythema. No pallor.  Psychiatric: Mood, memory and judgment normal.  Vitals reviewed.  LABORATORY DATA: Lab Results  Component Value Date   WBC 7.5 03/30/2023   HGB 13.5 03/30/2023   HCT 41.2 03/30/2023   MCV 92.6 03/30/2023   PLT 38 (L) 03/30/2023      Chemistry      Component Value Date/Time   NA 136 03/30/2023 1453   NA 138 12/06/2019 1438   K 4.2 03/30/2023 1453   CL 102 03/30/2023 1453   CO2 28 03/30/2023 1453   BUN 31 (H) 03/30/2023 1453   BUN  14 12/06/2019 1438   CREATININE 1.06 03/30/2023 1453   CREATININE 0.81 11/29/2015 1335      Component Value Date/Time   CALCIUM 9.7 03/30/2023 1453   ALKPHOS 81 03/30/2023 1453   AST 25 03/30/2023 1453   ALT 32 03/30/2023 1453   BILITOT 0.6 03/30/2023 1453       RADIOGRAPHIC STUDIES:  No results found.   ASSESSMENT/PLAN:  This is a very pleasant 82 year old Caucasian male diagnosed with recurrent/metastatic non-small cell lung cancer, adenocarcinoma that was initially diagnosed as stage Ib (T2 a, N0, M0) adenocarcinoma in September 2020 status post wedge resection of the left lower lobe lung nodule and he had recurrence in September 2022 presented with metastatic mediastinal lymphadenopathy in addition to left hilar and left pleural metastatic disease.  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    He underwent 2 cycles of systemic palliative chemotherapy with carboplatin AUC of 5 and Alimta 500 mg per metered squared and Keytruda 200 mg IV every 3 weeks.  He received 2 cycles.  This was discontinued due to receiving the results of his molecular studies which show EGFR mutation.   He was started on treatment with Tarceva 150 mg p.o. daily on 06/28/2021.  He was not a good candidate for Tagrisso because of his prolonged QT and concern about arrhythmia while on Tagrisso.     His dose was reduced to 100 mg p.o. daily of Tarceva. His dose was reduced in April 2023. He was on tarceva for about 20 months.    He had evidence of disease progression on PET scan in September 2024.    His treatment with Tarceva was discontinued and he was started on Tagrisso 80 mg p.o. daily on ~03/05/23. He thus far is tolerating this well except for thrombocytopenia. He was placed on steroids 10 mg p.o. daily.    Labs were reviewed. 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 He is on a  blood thinner but  he also has persistent atrial fibrillation. I reviewed his labs with Dr. Arbutus Ped. Dr. Arbutus Ped recommended he continue on Tagrisso at the same dose if there are concerns with falling and thrombocytopenia, the patient can consider holding tagrisso for 1 week and arranging for repeat labs next week to ensure improvement in his platelet count. I will also arrange for repeat stat brain MRI to ensure no metastatic disease to the brain given his recurrent dizziness. We will also refer to neurology for the dizziness and history of stroke, especially if negative foe metastatic disease to the brain. We will order him a walker at home if needed. I have extended his prednisone. He knows to monitor his diabetes closely with prednisone.   We anticipate a restaging CT scan in about 2 weeks or so. We will see him back after the scan to review the results.    If the patient develops any abnormal bleeding in the interval, recommend he call for lab recheck and follow up with ENT if he has recurrent nose bleeding. He will continue to use saline spray, humidifier, and use the ointment given by ENT.   We will repeat EKG today to monitor his Qtc. His Qtc is normal at 443 today. He has atrial flutter with a pulse rate in the low 100's (98-105).   The patient's daughter was given a handicap placard for the patient   The patient was advised to call immediately if she has any concerning symptoms in the interval. The patient voices understanding of current disease status and treatment options and is in agreement with the current care plan. All questions were answered. The patient knows to call the clinic with any problems, questions or concerns. We can certainly see the patient much sooner if necessary    No orders of the defined types were placed in this encounter.   The total time spent in the appointment was 30-39 minutes  Zuha Dejonge L Emerie Vanderkolk, PA-C 04/11/23

## 2023-04-14 ENCOUNTER — Inpatient Hospital Stay: Payer: Medicare Other | Attending: Physician Assistant

## 2023-04-14 ENCOUNTER — Inpatient Hospital Stay: Payer: Medicare Other | Admitting: Physician Assistant

## 2023-04-14 VITALS — BP 110/88 | Temp 97.2°F | Resp 18 | Ht 69.0 in | Wt 146.2 lb

## 2023-04-14 DIAGNOSIS — C3412 Malignant neoplasm of upper lobe, left bronchus or lung: Secondary | ICD-10-CM | POA: Diagnosis present

## 2023-04-14 DIAGNOSIS — Z8673 Personal history of transient ischemic attack (TIA), and cerebral infarction without residual deficits: Secondary | ICD-10-CM | POA: Insufficient documentation

## 2023-04-14 DIAGNOSIS — C3491 Malignant neoplasm of unspecified part of right bronchus or lung: Secondary | ICD-10-CM | POA: Diagnosis not present

## 2023-04-14 DIAGNOSIS — C3492 Malignant neoplasm of unspecified part of left bronchus or lung: Secondary | ICD-10-CM

## 2023-04-14 DIAGNOSIS — Z79899 Other long term (current) drug therapy: Secondary | ICD-10-CM | POA: Insufficient documentation

## 2023-04-14 DIAGNOSIS — D696 Thrombocytopenia, unspecified: Secondary | ICD-10-CM | POA: Diagnosis not present

## 2023-04-14 DIAGNOSIS — Z7901 Long term (current) use of anticoagulants: Secondary | ICD-10-CM | POA: Insufficient documentation

## 2023-04-14 DIAGNOSIS — C782 Secondary malignant neoplasm of pleura: Secondary | ICD-10-CM | POA: Insufficient documentation

## 2023-04-14 DIAGNOSIS — R42 Dizziness and giddiness: Secondary | ICD-10-CM

## 2023-04-14 DIAGNOSIS — Z923 Personal history of irradiation: Secondary | ICD-10-CM | POA: Diagnosis not present

## 2023-04-14 DIAGNOSIS — R197 Diarrhea, unspecified: Secondary | ICD-10-CM | POA: Diagnosis not present

## 2023-04-14 DIAGNOSIS — R519 Headache, unspecified: Secondary | ICD-10-CM | POA: Insufficient documentation

## 2023-04-14 DIAGNOSIS — Z9226 Personal history of immune checkpoint inhibitor therapy: Secondary | ICD-10-CM | POA: Diagnosis not present

## 2023-04-14 DIAGNOSIS — Z8546 Personal history of malignant neoplasm of prostate: Secondary | ICD-10-CM | POA: Diagnosis not present

## 2023-04-14 DIAGNOSIS — Z800 Family history of malignant neoplasm of digestive organs: Secondary | ICD-10-CM | POA: Insufficient documentation

## 2023-04-14 LAB — CMP (CANCER CENTER ONLY)
ALT: 20 U/L (ref 0–44)
AST: 23 U/L (ref 15–41)
Albumin: 3.7 g/dL (ref 3.5–5.0)
Alkaline Phosphatase: 91 U/L (ref 38–126)
Anion gap: 6 (ref 5–15)
BUN: 26 mg/dL — ABNORMAL HIGH (ref 8–23)
CO2: 26 mmol/L (ref 22–32)
Calcium: 9.7 mg/dL (ref 8.9–10.3)
Chloride: 100 mmol/L (ref 98–111)
Creatinine: 0.9 mg/dL (ref 0.61–1.24)
GFR, Estimated: >60 mL/min (ref >60)
Glucose, Bld: 207 mg/dL — ABNORMAL HIGH (ref 70–99)
Potassium: 4.7 mmol/L (ref 3.5–5.1)
Sodium: 132 mmol/L — ABNORMAL LOW (ref 135–145)
Total Bilirubin: 0.6 mg/dL (ref <1.2)
Total Protein: 7.5 g/dL (ref 6.5–8.1)

## 2023-04-14 LAB — CBC WITH DIFFERENTIAL (CANCER CENTER ONLY)
Abs Immature Granulocytes: 0.05 10*3/uL (ref 0.00–0.07)
Basophils Absolute: 0 10*3/uL (ref 0.0–0.1)
Basophils Relative: 0 %
Eosinophils Absolute: 0 10*3/uL (ref 0.0–0.5)
Eosinophils Relative: 1 %
HCT: 36.9 % — ABNORMAL LOW (ref 39.0–52.0)
Hemoglobin: 12.1 g/dL — ABNORMAL LOW (ref 13.0–17.0)
Immature Granulocytes: 1 %
Lymphocytes Relative: 10 %
Lymphs Abs: 0.6 10*3/uL — ABNORMAL LOW (ref 0.7–4.0)
MCH: 30.6 pg (ref 26.0–34.0)
MCHC: 32.8 g/dL (ref 30.0–36.0)
MCV: 93.2 fL (ref 80.0–100.0)
Monocytes Absolute: 0.3 10*3/uL (ref 0.1–1.0)
Monocytes Relative: 4 %
Neutro Abs: 5 10*3/uL (ref 1.7–7.7)
Neutrophils Relative %: 84 %
Platelet Count: 46 10*3/uL — ABNORMAL LOW (ref 150–400)
RBC: 3.96 MIL/uL — ABNORMAL LOW (ref 4.22–5.81)
RDW: 15.3 % (ref 11.5–15.5)
WBC Count: 6 10*3/uL (ref 4.0–10.5)
nRBC: 0 % (ref 0.0–0.2)

## 2023-04-14 MED ORDER — PREDNISONE 10 MG PO TABS: 10.0000 mg | ORAL_TABLET | Freq: Every day | ORAL | 0 refills | Status: DC

## 2023-04-20 ENCOUNTER — Other Ambulatory Visit: Payer: Self-pay

## 2023-04-21 ENCOUNTER — Encounter: Payer: Self-pay | Admitting: Internal Medicine

## 2023-04-21 ENCOUNTER — Telehealth: Payer: Self-pay | Admitting: Physician Assistant

## 2023-04-22 ENCOUNTER — Other Ambulatory Visit: Payer: Self-pay

## 2023-04-22 DIAGNOSIS — C3491 Malignant neoplasm of unspecified part of right bronchus or lung: Secondary | ICD-10-CM

## 2023-04-22 NOTE — Telephone Encounter (Signed)
Spoke with patients daughter about up coming appts. Per Cassie, PA's LOS, pt needs f/u lab this week.  Patients daughter stated that with the holidays coming up, they are short on time and can't make the f/u lab appt.  Scheduled patient 12/5 for labs and provider visit.. Patients daughter confirmed.   Per Cassie, PA's last note- Confirmed with patients daughter that pt needs to take Tagrisso 80 mg daily and steroid 10 mg daily. Patients daughter confirmed.

## 2023-04-26 ENCOUNTER — Telehealth: Payer: Self-pay | Admitting: Medical Oncology

## 2023-04-26 ENCOUNTER — Other Ambulatory Visit: Payer: Self-pay

## 2023-04-26 ENCOUNTER — Emergency Department (HOSPITAL_COMMUNITY): Payer: Medicare Other

## 2023-04-26 ENCOUNTER — Inpatient Hospital Stay (HOSPITAL_COMMUNITY)
Admission: EM | Admit: 2023-04-26 | Discharge: 2023-05-02 | DRG: 205 | Disposition: E | Payer: Medicare Other | Attending: Internal Medicine | Admitting: Internal Medicine

## 2023-04-26 ENCOUNTER — Encounter (HOSPITAL_COMMUNITY): Payer: Self-pay | Admitting: Internal Medicine

## 2023-04-26 DIAGNOSIS — Z9841 Cataract extraction status, right eye: Secondary | ICD-10-CM

## 2023-04-26 DIAGNOSIS — T451X5A Adverse effect of antineoplastic and immunosuppressive drugs, initial encounter: Secondary | ICD-10-CM | POA: Diagnosis present

## 2023-04-26 DIAGNOSIS — I4819 Other persistent atrial fibrillation: Secondary | ICD-10-CM | POA: Diagnosis present

## 2023-04-26 DIAGNOSIS — I352 Nonrheumatic aortic (valve) stenosis with insufficiency: Secondary | ICD-10-CM | POA: Diagnosis present

## 2023-04-26 DIAGNOSIS — Z881 Allergy status to other antibiotic agents status: Secondary | ICD-10-CM

## 2023-04-26 DIAGNOSIS — Z833 Family history of diabetes mellitus: Secondary | ICD-10-CM

## 2023-04-26 DIAGNOSIS — J9601 Acute respiratory failure with hypoxia: Secondary | ICD-10-CM | POA: Diagnosis present

## 2023-04-26 DIAGNOSIS — J440 Chronic obstructive pulmonary disease with acute lower respiratory infection: Secondary | ICD-10-CM | POA: Diagnosis present

## 2023-04-26 DIAGNOSIS — C782 Secondary malignant neoplasm of pleura: Secondary | ICD-10-CM | POA: Diagnosis present

## 2023-04-26 DIAGNOSIS — I2510 Atherosclerotic heart disease of native coronary artery without angina pectoris: Secondary | ICD-10-CM | POA: Diagnosis present

## 2023-04-26 DIAGNOSIS — Z902 Acquired absence of lung [part of]: Secondary | ICD-10-CM

## 2023-04-26 DIAGNOSIS — I420 Dilated cardiomyopathy: Secondary | ICD-10-CM | POA: Diagnosis present

## 2023-04-26 DIAGNOSIS — Z95828 Presence of other vascular implants and grafts: Secondary | ICD-10-CM

## 2023-04-26 DIAGNOSIS — I4892 Unspecified atrial flutter: Secondary | ICD-10-CM | POA: Diagnosis present

## 2023-04-26 DIAGNOSIS — I110 Hypertensive heart disease with heart failure: Secondary | ICD-10-CM | POA: Diagnosis present

## 2023-04-26 DIAGNOSIS — J984 Other disorders of lung: Secondary | ICD-10-CM | POA: Diagnosis present

## 2023-04-26 DIAGNOSIS — R042 Hemoptysis: Secondary | ICD-10-CM | POA: Diagnosis present

## 2023-04-26 DIAGNOSIS — Z85118 Personal history of other malignant neoplasm of bronchus and lung: Secondary | ICD-10-CM

## 2023-04-26 DIAGNOSIS — I739 Peripheral vascular disease, unspecified: Secondary | ICD-10-CM | POA: Diagnosis present

## 2023-04-26 DIAGNOSIS — C3492 Malignant neoplasm of unspecified part of left bronchus or lung: Secondary | ICD-10-CM | POA: Diagnosis not present

## 2023-04-26 DIAGNOSIS — J84112 Idiopathic pulmonary fibrosis: Secondary | ICD-10-CM | POA: Diagnosis present

## 2023-04-26 DIAGNOSIS — Z7969 Long term (current) use of other immunomodulators and immunosuppressants: Secondary | ICD-10-CM

## 2023-04-26 DIAGNOSIS — Z7984 Long term (current) use of oral hypoglycemic drugs: Secondary | ICD-10-CM

## 2023-04-26 DIAGNOSIS — I5022 Chronic systolic (congestive) heart failure: Secondary | ICD-10-CM | POA: Diagnosis present

## 2023-04-26 DIAGNOSIS — E119 Type 2 diabetes mellitus without complications: Secondary | ICD-10-CM

## 2023-04-26 DIAGNOSIS — Z9221 Personal history of antineoplastic chemotherapy: Secondary | ICD-10-CM

## 2023-04-26 DIAGNOSIS — I351 Nonrheumatic aortic (valve) insufficiency: Secondary | ICD-10-CM

## 2023-04-26 DIAGNOSIS — Z8546 Personal history of malignant neoplasm of prostate: Secondary | ICD-10-CM

## 2023-04-26 DIAGNOSIS — E43 Unspecified severe protein-calorie malnutrition: Secondary | ICD-10-CM | POA: Diagnosis present

## 2023-04-26 DIAGNOSIS — E785 Hyperlipidemia, unspecified: Secondary | ICD-10-CM | POA: Diagnosis present

## 2023-04-26 DIAGNOSIS — I714 Abdominal aortic aneurysm, without rupture, unspecified: Secondary | ICD-10-CM | POA: Diagnosis present

## 2023-04-26 DIAGNOSIS — J449 Chronic obstructive pulmonary disease, unspecified: Secondary | ICD-10-CM | POA: Diagnosis present

## 2023-04-26 DIAGNOSIS — R590 Localized enlarged lymph nodes: Secondary | ICD-10-CM | POA: Diagnosis present

## 2023-04-26 DIAGNOSIS — I10 Essential (primary) hypertension: Secondary | ICD-10-CM | POA: Diagnosis present

## 2023-04-26 DIAGNOSIS — J704 Drug-induced interstitial lung disorders, unspecified: Principal | ICD-10-CM | POA: Diagnosis present

## 2023-04-26 DIAGNOSIS — Z87891 Personal history of nicotine dependence: Secondary | ICD-10-CM

## 2023-04-26 DIAGNOSIS — C771 Secondary and unspecified malignant neoplasm of intrathoracic lymph nodes: Secondary | ICD-10-CM | POA: Diagnosis present

## 2023-04-26 DIAGNOSIS — I359 Nonrheumatic aortic valve disorder, unspecified: Secondary | ICD-10-CM | POA: Diagnosis not present

## 2023-04-26 DIAGNOSIS — K219 Gastro-esophageal reflux disease without esophagitis: Secondary | ICD-10-CM | POA: Diagnosis present

## 2023-04-26 DIAGNOSIS — Z83438 Family history of other disorder of lipoprotein metabolism and other lipidemia: Secondary | ICD-10-CM

## 2023-04-26 DIAGNOSIS — Z6821 Body mass index (BMI) 21.0-21.9, adult: Secondary | ICD-10-CM

## 2023-04-26 DIAGNOSIS — F419 Anxiety disorder, unspecified: Secondary | ICD-10-CM | POA: Diagnosis present

## 2023-04-26 DIAGNOSIS — Z8042 Family history of malignant neoplasm of prostate: Secondary | ICD-10-CM

## 2023-04-26 DIAGNOSIS — Z800 Family history of malignant neoplasm of digestive organs: Secondary | ICD-10-CM

## 2023-04-26 DIAGNOSIS — M549 Dorsalgia, unspecified: Secondary | ICD-10-CM | POA: Diagnosis present

## 2023-04-26 DIAGNOSIS — R002 Palpitations: Secondary | ICD-10-CM | POA: Diagnosis present

## 2023-04-26 DIAGNOSIS — R911 Solitary pulmonary nodule: Secondary | ICD-10-CM | POA: Diagnosis present

## 2023-04-26 DIAGNOSIS — Z7952 Long term (current) use of systemic steroids: Secondary | ICD-10-CM

## 2023-04-26 DIAGNOSIS — Z89021 Acquired absence of right finger(s): Secondary | ICD-10-CM

## 2023-04-26 DIAGNOSIS — R64 Cachexia: Secondary | ICD-10-CM | POA: Diagnosis present

## 2023-04-26 DIAGNOSIS — I5020 Unspecified systolic (congestive) heart failure: Secondary | ICD-10-CM

## 2023-04-26 DIAGNOSIS — Z8679 Personal history of other diseases of the circulatory system: Secondary | ICD-10-CM

## 2023-04-26 DIAGNOSIS — Z952 Presence of prosthetic heart valve: Secondary | ICD-10-CM

## 2023-04-26 DIAGNOSIS — Z7901 Long term (current) use of anticoagulants: Secondary | ICD-10-CM

## 2023-04-26 DIAGNOSIS — Z9889 Other specified postprocedural states: Secondary | ICD-10-CM

## 2023-04-26 DIAGNOSIS — Z8249 Family history of ischemic heart disease and other diseases of the circulatory system: Secondary | ICD-10-CM

## 2023-04-26 DIAGNOSIS — I493 Ventricular premature depolarization: Secondary | ICD-10-CM | POA: Diagnosis present

## 2023-04-26 DIAGNOSIS — Z1152 Encounter for screening for COVID-19: Secondary | ICD-10-CM

## 2023-04-26 DIAGNOSIS — C3490 Malignant neoplasm of unspecified part of unspecified bronchus or lung: Secondary | ICD-10-CM | POA: Diagnosis present

## 2023-04-26 DIAGNOSIS — Z79899 Other long term (current) drug therapy: Secondary | ICD-10-CM

## 2023-04-26 DIAGNOSIS — H409 Unspecified glaucoma: Secondary | ICD-10-CM | POA: Diagnosis present

## 2023-04-26 DIAGNOSIS — I471 Supraventricular tachycardia, unspecified: Secondary | ICD-10-CM | POA: Diagnosis present

## 2023-04-26 DIAGNOSIS — Z515 Encounter for palliative care: Secondary | ICD-10-CM

## 2023-04-26 DIAGNOSIS — J189 Pneumonia, unspecified organism: Secondary | ICD-10-CM | POA: Diagnosis not present

## 2023-04-26 DIAGNOSIS — E1151 Type 2 diabetes mellitus with diabetic peripheral angiopathy without gangrene: Secondary | ICD-10-CM | POA: Diagnosis present

## 2023-04-26 DIAGNOSIS — I444 Left anterior fascicular block: Secondary | ICD-10-CM | POA: Diagnosis present

## 2023-04-26 DIAGNOSIS — I4821 Permanent atrial fibrillation: Secondary | ICD-10-CM | POA: Diagnosis present

## 2023-04-26 DIAGNOSIS — J9621 Acute and chronic respiratory failure with hypoxia: Secondary | ICD-10-CM | POA: Diagnosis not present

## 2023-04-26 DIAGNOSIS — D696 Thrombocytopenia, unspecified: Secondary | ICD-10-CM | POA: Diagnosis present

## 2023-04-26 DIAGNOSIS — Z9842 Cataract extraction status, left eye: Secondary | ICD-10-CM

## 2023-04-26 DIAGNOSIS — R7989 Other specified abnormal findings of blood chemistry: Secondary | ICD-10-CM

## 2023-04-26 DIAGNOSIS — Z66 Do not resuscitate: Secondary | ICD-10-CM | POA: Diagnosis present

## 2023-04-26 LAB — CBC
HCT: 36.3 % — ABNORMAL LOW (ref 39.0–52.0)
Hemoglobin: 11.8 g/dL — ABNORMAL LOW (ref 13.0–17.0)
MCH: 30.3 pg (ref 26.0–34.0)
MCHC: 32.5 g/dL (ref 30.0–36.0)
MCV: 93.1 fL (ref 80.0–100.0)
Platelets: 42 10*3/uL — ABNORMAL LOW (ref 150–400)
RBC: 3.9 MIL/uL — ABNORMAL LOW (ref 4.22–5.81)
RDW: 15.8 % — ABNORMAL HIGH (ref 11.5–15.5)
WBC: 12.1 10*3/uL — ABNORMAL HIGH (ref 4.0–10.5)
nRBC: 0 % (ref 0.0–0.2)

## 2023-04-26 LAB — CBC WITH DIFFERENTIAL/PLATELET
Abs Immature Granulocytes: 0.06 10*3/uL (ref 0.00–0.07)
Basophils Absolute: 0 10*3/uL (ref 0.0–0.1)
Basophils Relative: 0 %
Eosinophils Absolute: 0 10*3/uL (ref 0.0–0.5)
Eosinophils Relative: 0 %
HCT: 36.9 % — ABNORMAL LOW (ref 39.0–52.0)
Hemoglobin: 12 g/dL — ABNORMAL LOW (ref 13.0–17.0)
Immature Granulocytes: 1 %
Lymphocytes Relative: 6 %
Lymphs Abs: 0.7 10*3/uL (ref 0.7–4.0)
MCH: 30.7 pg (ref 26.0–34.0)
MCHC: 32.5 g/dL (ref 30.0–36.0)
MCV: 94.4 fL (ref 80.0–100.0)
Monocytes Absolute: 0.9 10*3/uL (ref 0.1–1.0)
Monocytes Relative: 8 %
Neutro Abs: 10.1 10*3/uL — ABNORMAL HIGH (ref 1.7–7.7)
Neutrophils Relative %: 85 %
Platelets: 41 10*3/uL — ABNORMAL LOW (ref 150–400)
RBC: 3.91 MIL/uL — ABNORMAL LOW (ref 4.22–5.81)
RDW: 15.8 % — ABNORMAL HIGH (ref 11.5–15.5)
WBC: 11.8 10*3/uL — ABNORMAL HIGH (ref 4.0–10.5)
nRBC: 0 % (ref 0.0–0.2)

## 2023-04-26 LAB — COMPREHENSIVE METABOLIC PANEL
ALT: 17 U/L (ref 0–44)
AST: 37 U/L (ref 15–41)
Albumin: 3.1 g/dL — ABNORMAL LOW (ref 3.5–5.0)
Alkaline Phosphatase: 84 U/L (ref 38–126)
Anion gap: 10 (ref 5–15)
BUN: 24 mg/dL — ABNORMAL HIGH (ref 8–23)
CO2: 20 mmol/L — ABNORMAL LOW (ref 22–32)
Calcium: 9.2 mg/dL (ref 8.9–10.3)
Chloride: 102 mmol/L (ref 98–111)
Creatinine, Ser: 0.92 mg/dL (ref 0.61–1.24)
GFR, Estimated: >60 mL/min (ref >60)
Glucose, Bld: 130 mg/dL — ABNORMAL HIGH (ref 70–99)
Potassium: 3.8 mmol/L (ref 3.5–5.1)
Sodium: 132 mmol/L — ABNORMAL LOW (ref 135–145)
Total Bilirubin: 1.5 mg/dL — ABNORMAL HIGH (ref <1.2)
Total Protein: 7.4 g/dL (ref 6.5–8.1)

## 2023-04-26 LAB — BRAIN NATRIURETIC PEPTIDE: B Natriuretic Peptide: 158 pg/mL — ABNORMAL HIGH (ref 0.0–100.0)

## 2023-04-26 LAB — TYPE AND SCREEN
ABO/RH(D): O POS
Antibody Screen: NEGATIVE

## 2023-04-26 LAB — PROTIME-INR
INR: 2 — ABNORMAL HIGH (ref 0.8–1.2)
Prothrombin Time: 22.7 s — ABNORMAL HIGH (ref 11.4–15.2)

## 2023-04-26 LAB — I-STAT CG4 LACTIC ACID, ED
Lactic Acid, Venous: 1 mmol/L (ref 0.5–1.9)
Lactic Acid, Venous: 1 mmol/L (ref 0.5–1.9)

## 2023-04-26 LAB — RESP PANEL BY RT-PCR (RSV, FLU A&B, COVID)  RVPGX2
Influenza A by PCR: NEGATIVE
Influenza B by PCR: NEGATIVE
Resp Syncytial Virus by PCR: NEGATIVE
SARS Coronavirus 2 by RT PCR: NEGATIVE

## 2023-04-26 LAB — TROPONIN I (HIGH SENSITIVITY)
Troponin I (High Sensitivity): 36 ng/L — ABNORMAL HIGH (ref <18)
Troponin I (High Sensitivity): 37 ng/L — ABNORMAL HIGH (ref <18)

## 2023-04-26 LAB — MAGNESIUM: Magnesium: 2.1 mg/dL (ref 1.7–2.4)

## 2023-04-26 MED ORDER — DILTIAZEM HCL ER 60 MG PO CP12
60.0000 mg | ORAL_CAPSULE | Freq: Two times a day (BID) | ORAL | Status: DC
  Administered 2023-04-26: 60 mg via ORAL
  Filled 2023-04-26: qty 1

## 2023-04-26 MED ORDER — AMIODARONE IV BOLUS ONLY 150 MG/100ML
150.0000 mg | Freq: Once | INTRAVENOUS | Status: AC
  Administered 2023-04-26: 150 mg via INTRAVENOUS
  Filled 2023-04-26: qty 100

## 2023-04-26 MED ORDER — INSULIN ASPART 100 UNIT/ML IJ SOLN
0.0000 [IU] | Freq: Three times a day (TID) | INTRAMUSCULAR | Status: DC
  Administered 2023-04-27: 2 [IU] via SUBCUTANEOUS
  Administered 2023-04-27: 9 [IU] via SUBCUTANEOUS

## 2023-04-26 MED ORDER — VANCOMYCIN HCL 1250 MG/250ML IV SOLN
1250.0000 mg | Freq: Once | INTRAVENOUS | Status: AC
Start: 2023-04-26 — End: 2023-04-26
  Administered 2023-04-26: 1250 mg via INTRAVENOUS
  Filled 2023-04-26: qty 250

## 2023-04-26 MED ORDER — IPRATROPIUM BROMIDE HFA 17 MCG/ACT IN AERS
2.0000 | INHALATION_SPRAY | Freq: Once | RESPIRATORY_TRACT | Status: AC
  Administered 2023-04-26: 2 via RESPIRATORY_TRACT
  Filled 2023-04-26: qty 12.9

## 2023-04-26 MED ORDER — ALBUTEROL SULFATE (2.5 MG/3ML) 0.083% IN NEBU
5.0000 mg | INHALATION_SOLUTION | Freq: Once | RESPIRATORY_TRACT | Status: AC
Start: 2023-04-26 — End: 2023-04-26
  Administered 2023-04-26: 5 mg via RESPIRATORY_TRACT
  Filled 2023-04-26: qty 6

## 2023-04-26 MED ORDER — ACETAMINOPHEN 650 MG RE SUPP: 650.0000 mg | Freq: Four times a day (QID) | RECTAL | Status: DC | PRN

## 2023-04-26 MED ORDER — DOXYCYCLINE HYCLATE 100 MG PO TABS
100.0000 mg | ORAL_TABLET | Freq: Two times a day (BID) | ORAL | Status: DC
  Administered 2023-04-26 – 2023-04-27 (×4): 100 mg via ORAL
  Filled 2023-04-26 (×4): qty 1

## 2023-04-26 MED ORDER — SODIUM CHLORIDE 0.9 % IV SOLN
2.0000 g | INTRAVENOUS | Status: DC
  Administered 2023-04-26 – 2023-04-27 (×2): 2 g via INTRAVENOUS
  Filled 2023-04-26 (×4): qty 20

## 2023-04-26 MED ORDER — SODIUM CHLORIDE 0.9 % IV SOLN
2.0000 g | Freq: Once | INTRAVENOUS | Status: AC
  Administered 2023-04-26: 2 g via INTRAVENOUS
  Filled 2023-04-26: qty 12.5

## 2023-04-26 MED ORDER — FUROSEMIDE 10 MG/ML IJ SOLN
40.0000 mg | Freq: Once | INTRAMUSCULAR | Status: AC
  Administered 2023-04-26: 40 mg via INTRAVENOUS
  Filled 2023-04-26: qty 4

## 2023-04-26 MED ORDER — SIMVASTATIN 20 MG PO TABS
40.0000 mg | ORAL_TABLET | Freq: Every day | ORAL | Status: DC
  Administered 2023-04-26: 40 mg via ORAL
  Filled 2023-04-26: qty 2

## 2023-04-26 MED ORDER — AMIODARONE HCL IN DEXTROSE 360-4.14 MG/200ML-% IV SOLN
60.0000 mg/h | INTRAVENOUS | Status: AC
  Administered 2023-04-26 (×2): 60 mg/h via INTRAVENOUS
  Filled 2023-04-26: qty 200

## 2023-04-26 MED ORDER — IOHEXOL 350 MG/ML SOLN
75.0000 mL | Freq: Once | INTRAVENOUS | Status: AC | PRN
  Administered 2023-04-26: 75 mL via INTRAVENOUS

## 2023-04-26 MED ORDER — METOPROLOL SUCCINATE ER 25 MG PO TB24: 25.0000 mg | ORAL_TABLET | Freq: Every day | ORAL | Status: DC

## 2023-04-26 MED ORDER — AMIODARONE HCL IN DEXTROSE 360-4.14 MG/200ML-% IV SOLN
30.0000 mg/h | INTRAVENOUS | Status: DC
  Filled 2023-04-26: qty 200

## 2023-04-26 MED ORDER — METHYLPREDNISOLONE SODIUM SUCC 40 MG IJ SOLR
40.0000 mg | Freq: Two times a day (BID) | INTRAMUSCULAR | Status: DC
  Administered 2023-04-26 – 2023-04-29 (×6): 40 mg via INTRAVENOUS
  Filled 2023-04-26 (×6): qty 1

## 2023-04-26 MED ORDER — POLYETHYLENE GLYCOL 3350 17 G PO PACK: 17.0000 g | PACK | Freq: Every day | ORAL | Status: DC | PRN

## 2023-04-26 MED ORDER — SODIUM CHLORIDE 0.9% FLUSH
3.0000 mL | Freq: Two times a day (BID) | INTRAVENOUS | Status: DC
  Administered 2023-04-26 – 2023-04-28 (×5): 3 mL via INTRAVENOUS

## 2023-04-26 MED ORDER — TRANEXAMIC ACID FOR INHALATION
500.0000 mg | Freq: Three times a day (TID) | RESPIRATORY_TRACT | Status: AC
  Administered 2023-04-26 – 2023-04-28 (×5): 500 mg via RESPIRATORY_TRACT
  Filled 2023-04-26 (×6): qty 10

## 2023-04-26 MED ORDER — ACETAMINOPHEN 325 MG PO TABS: 650.0000 mg | ORAL_TABLET | Freq: Four times a day (QID) | ORAL | Status: DC | PRN

## 2023-04-26 MED ORDER — AMIODARONE LOAD VIA INFUSION
150.0000 mg | Freq: Once | INTRAVENOUS | Status: AC
  Administered 2023-04-26: 150 mg via INTRAVENOUS
  Filled 2023-04-26: qty 83.34

## 2023-04-26 NOTE — H&P (Signed)
History and Physical   Vincent Velez UJW:[id number] DOB: 1941-04-03 DOA: 04/26/2023  PCP: [name], MD   Patient coming from: Home  Chief Complaint: Shortness of breath, hemoptysis, palpitations  HPI: Vincent Velez is a 82 y.o. male with medical history significant of hypertension, hyperlipidemia, diabetes, GERD, PAD, COPD, atrial fibrillation, anxiety, glaucoma, AAA, aortic stenosis status post TAVR, lung cancer on immunotherapy presenting with shortness of breath, hemoptysis, palpitations.  Patient had onset of above symptoms yesterday.  He has had progressive shortness of breath and palpitations overnight into this morning.  Reports cough with blood-tinged mucus but no clots.  Does report some chest pain and back pain as well.  Has had around 40 pound weight loss in last 6 months.  Known history of recurrent lung cancer first diagnosed in 2020, underwent resection at that time.  Recurrence in 2022 and underwent palliative chemotherapy followed by Keytruda followed by Tarceva, then Tagrisso which is current therapy.  Denies fevers, chills, abdominal pain, constipation, diarrhea, nausea, vomiting.  ED Course: Vital signs in the ED notable for heart rate in the 120s to 130s, respiratory rate in the 20s, requiring 2 L to maintain saturations.  Lab workup included CMP with sodium 132, bicarb 20, BUN 24, glucose 130, albumin 3.1, T. bili 1.5.  CBC showed mild leukocytosis to 11.8 with hemoglobin stable at 12, platelets noted to be 42.  PT and INR mildly elevated at 22.7 and 2 respectively.  Troponin flat at 36 and then 37 on repeat.  BNP indeterminate at 158.  Lactic acid normal x 2.  Respiratory panel for flu COVID and RSV negative.  Urinalysis, type and screen, ESR, procalcitonin, strep, Legionella pending in ED.  Blood cultures pending.  Chest x-ray showed extensive progressive reticulation and interstitial prominence consistent with progressive ILD.  Cannot rule out atypical infection  or other.  CTA PE study was negative for PE but did show diffuse reticular airspace disease consistent with pneumonia versus ILD.  Also noted was increased lymphadenopathy.  Magnesium also normal in ED.  Pulmonology was consulted and are following with suspicion for pneumonia versus pneumonitis secondary to Tagrisso versus fluid component versus other.  Patient has received amiodarone infusion, Lasix, cefepime and vancomycin followed by ceftriaxone and doxycycline, Solu-Medrol, Atrovent, albuterol, tranexamic acid nebs.  Seen by pulmonology/critical care and upon discussion with them elected to be DNR/DNI, pulmonology continues to follow in consultation.  Review of Systems: As per HPI otherwise all other systems reviewed and are negative.  Past Medical History:  Diagnosis Date   AAA (abdominal aortic aneurysm) (HCC)    Anxiety    Arthritis    Asthma    when younger   Atrial fibrillation (HCC)    CAD (coronary artery disease)    Cataract    removed both   COPD (chronic obstructive pulmonary disease) (HCC)    Diabetes mellitus    Type II   Dysrhythmia    afib   Esophageal reflux    Family history of malignant neoplasm of gastrointestinal tract    Heart murmur    Hiatal hernia    Hyperlipemia    Hypertension    Lung nodule    a. PET scan highly suspicious for malignancy. Bronch with biopsy to be done after TAVR   Malignant neoplasm of prostate [facility])    prostate    Peripheral vascular disease (HCC)    Pneumonia    Stricture and stenosis of esophagus     Past Surgical History:  Procedure Laterality  Date   ABDOMINAL AORTA STENT     CARDIAC CATHETERIZATION     CARDIOVERSION N/A 12/04/2015   Procedure: CARDIOVERSION;  Surgeon: Thurmon Fair, MD;  Location: [facility] ENDOSCOPY;  Service: Cardiovascular;  Laterality: N/A;   COLONOSCOPY     FINGER SURGERY Right    middle finger- amputation   INSERTION PROSTATE RADIATION SEED     MEDIASTINOSCOPY N/A 01/06/2019   Procedure: MEDIASTINOSCOPY  WITH BIOPIES;  Surgeon: Alleen Borne, MD;  Location: [facility] OR;  Service: Thoracic;  Laterality: N/A;   RIGHT/LEFT HEART CATH AND CORONARY ANGIOGRAPHY N/A 10/26/2018   Procedure: RIGHT/LEFT HEART CATH AND CORONARY ANGIOGRAPHY;  Surgeon: Kathleene Hazel, MD;  Location: [facility] INVASIVE CV LAB;  Service: Cardiovascular;  Laterality: N/A;   TEE WITHOUT CARDIOVERSION N/A 12/06/2018   Procedure: TRANSESOPHAGEAL ECHOCARDIOGRAM (TEE);  Surgeon: Kathleene Hazel, MD;  Location: [facility] INVASIVE CV LAB;  Service: Open Heart Surgery;  Laterality: N/A;   TRANSCATHETER AORTIC VALVE REPLACEMENT, TRANSFEMORAL N/A 12/06/2018   Procedure: TRANSCATHETER AORTIC VALVE REPLACEMENT, TRANSFEMORAL;  Surgeon: Kathleene Hazel, MD;  Location: [facility] INVASIVE CV LAB;  Service: Open Heart Surgery;  Laterality: N/A;   UPPER GASTROINTESTINAL ENDOSCOPY     VIDEO ASSISTED THORACOSCOPY (VATS)/THOROCOTOMY Left 02/02/2019   Procedure: VIDEO ASSISTED THORACOSCOPY (VATS)/THOROCOTOMY;  Surgeon: Alleen Borne, MD;  Location: [facility] OR;  Service: Thoracic;  Laterality: Left;   VIDEO BRONCHOSCOPY N/A 01/06/2019   Procedure: VIDEO BRONCHOSCOPY;  Surgeon: Alleen Borne, MD;  Location: [facility] OR;  Service: Thoracic;  Laterality: N/A;   VIDEO BRONCHOSCOPY WITH ENDOBRONCHIAL ULTRASOUND N/A 03/07/2021   Procedure: VIDEO BRONCHOSCOPY WITH ENDOBRONCHIAL ULTRASOUND;  Surgeon: Loreli Slot, MD;  Location: [facility] OR;  Service: Thoracic;  Laterality: N/A;   WEDGE RESECTION Left 02/02/2019   Procedure: LUNG RESECTION;  Surgeon: Alleen Borne, MD;  Location: [facility] OR;  Service: Thoracic;  Laterality: Left;    Social History  reports that he quit smoking about 4 years ago. His smoking use included pipe and cigars. He has never used smokeless tobacco. He reports that he does not currently use alcohol. He reports that he does not use drugs.  Allergies  Allergen Reactions   Macrolides And Ketolides Other (See Comments)    Pt unsure of this     Family  History  Problem Relation Age of Onset   Colon cancer Father        questionable   Diabetes Mother    Heart disease Mother        Heart Disease before age 17   Deep vein thrombosis Mother    Hyperlipidemia Mother    Hypertension Mother    Varicose Veins Mother    Hypertension Brother    Heart attack Brother    Prostate cancer Brother    Colon polyps Neg Hx    Esophageal cancer Neg Hx    Rectal cancer Neg Hx    Stomach cancer Neg Hx   Reviewed on admission  Prior to Admission medications   Medication Sig Start Date End Date Taking? Authorizing Provider  diltiazem (CARDIZEM SR) 60 MG 12 hr capsule Take 1 capsule (60 mg total) by mouth every 12 (twelve) hours. 06/06/19  Yes Janetta Hora, PA-C  ferrous sulfate 325 (65 FE) MG tablet Take 325 mg by mouth daily with breakfast.   Yes [provider]  glimepiride (AMARYL) 2 MG tablet Take 2 mg by mouth daily with breakfast.   Yes [provider]  JARDIANCE 25 MG TABS tablet Take  25 mg by mouth daily. 03/01/21  Yes [provider]  metFORMIN (GLUCOPHAGE-XR) 500 MG 24 hr tablet Take 1,000 mg by mouth every evening.  07/01/18  Yes [provider]  metoprolol succinate (TOPROL-XL) 50 MG 24 hr tablet Take 25 mg by mouth daily. 03/10/23  Yes [provider]  osimertinib mesylate (TAGRISSO) 80 MG tablet Take 1 tablet (80 mg total) by mouth daily. 03/04/23  Yes Si Gaul, MD  predniSONE (DELTASONE) 10 MG tablet Take 1 tablet (10 mg total) by mouth daily with breakfast. 04/14/23  Yes Heilingoetter, Cassandra L, PA-C  simvastatin (ZOCOR) 40 MG tablet Take 40 mg by mouth at bedtime.   Yes [provider]  XARELTO 20 MG TABS tablet TAKE 1 TABLET BY MOUTH  DAILY WITH SUPPER 12/20/17  Yes Jake Bathe, MD  ACCU-CHEK GUIDE test strip USE TO CHECK YOUR BLOOD SUGAR ONCE DAILY 03/28/21   [provider]  dorzolamide-timolol (COSOPT) 22.3-6.8 MG/ML ophthalmic solution Place 1 drop into both  eyes in the morning, at noon, and at bedtime. Patient not taking: Reported on 04/26/2023 06/05/19   [provider]  mirtazapine (REMERON) 15 MG tablet Take 1 tablet (15 mg total) by mouth at bedtime. Patient not taking: Reported on 04/26/2023 01/13/23   Heilingoetter, Cassandra L, PA-C  Multiple Vitamins-Minerals (MULTIVITAMIN WITH MINERALS) tablet Take 1 tablet by mouth daily. Patient not taking: Reported on 04/26/2023    [provider]  Multiple Vitamins-Minerals (ZINC PO) Take 1 tablet by mouth daily. Patient not taking: Reported on 04/26/2023    [provider]  prochlorperazine (COMPAZINE) 10 MG tablet Take 1 tablet (10 mg total) by mouth every 6 (six) hours as needed for nausea or vomiting. Patient not taking: Reported on 04/26/2023 01/13/23   Heilingoetter, Cassandra L, PA-C  saline (AYR) GEL Place 1 Application into both nostrils every 4 (four) hours as needed. 03/19/23   Read Drivers, MD    Physical Exam: Vitals:   04/26/23 1704 04/26/23 1730 04/26/23 1745 04/26/23 1831  BP:  124/85 118/78   Pulse:  (!) 122 (!) 128   Resp:  (!) 26 (!) 25   Temp: 98.3 F (36.8 C)     TempSrc: Oral     SpO2:  93% 94% 93%  Weight:      Height:        Physical Exam Constitutional:      General: He is not in acute distress.    Appearance: Normal appearance.  HENT:     Head: Normocephalic and atraumatic.     Mouth/Throat:     Mouth: Mucous membranes are moist.     Pharynx: Oropharynx is clear.  Eyes:     Extraocular Movements: Extraocular movements intact.     Pupils: Pupils are equal, round, and reactive to light.  Cardiovascular:     Rate and Rhythm: Regular rhythm. Tachycardia present.     Pulses: Normal pulses.     Heart sounds: Normal heart sounds.  Pulmonary:     Effort: Pulmonary effort is normal. Tachypnea present. No respiratory distress.     Comments: Crackles Abdominal:     General: Bowel sounds are normal. There is no distension.      Palpations: Abdomen is soft.     Tenderness: There is no abdominal tenderness.  Musculoskeletal:        General: No swelling or deformity.  Skin:    General: Skin is warm and dry.  Neurological:     General: No focal  deficit present.     Mental Status: Mental status is at baseline.    Labs on Admission: I have personally reviewed following labs and imaging studies  CBC: Recent Labs  Lab 04/26/23 1255  WBC 11.8*  12.1*  NEUTROABS 10.1*  HGB 12.0*  11.8*  HCT 36.9*  36.3*  MCV 94.4  93.1  PLT 41*  42*    Basic Metabolic Panel: Recent Labs  Lab 04/26/23 1255 04/26/23 1402  NA 132*  --   K 3.8  --   CL 102  --   CO2 20*  --   GLUCOSE 130*  --   BUN 24*  --   CREATININE 0.92  --   CALCIUM 9.2  --   MG  --  2.1    GFR: Estimated Creatinine Clearance: 59.6 mL/min (by C-G formula based on SCr of 0.92 mg/dL).  Liver Function Tests: Recent Labs  Lab 04/26/23 1255  AST 37  ALT 17  ALKPHOS 84  BILITOT 1.5*  PROT 7.4  ALBUMIN 3.1*    Urine analysis:    Component Value Date/Time   COLORURINE YELLOW 01/30/2019 1557   APPEARANCEUR CLEAR 01/30/2019 1557   LABSPEC 1.016 01/30/2019 1557   PHURINE 5.0 01/30/2019 1557   GLUCOSEU NEGATIVE 01/30/2019 1557   HGBUR NEGATIVE 01/30/2019 1557   BILIRUBINUR NEGATIVE 01/30/2019 1557   KETONESUR NEGATIVE 01/30/2019 1557   PROTEINUR NEGATIVE 01/30/2019 1557   UROBILINOGEN 1.0 04/11/2009 0819   NITRITE NEGATIVE 01/30/2019 1557   LEUKOCYTESUR NEGATIVE 01/30/2019 1557    Radiological Exams on Admission: CT Angio Chest PE W and/or Wo Contrast  Result Date: 04/26/2023 CLINICAL DATA:  Hemoptysis.  History of lung cancer. EXAM: CT ANGIOGRAPHY CHEST WITH CONTRAST TECHNIQUE: Multidetector CT imaging of the chest was performed using the standard protocol during bolus administration of intravenous contrast. Multiplanar CT image reconstructions and MIPs were obtained to evaluate the vascular anatomy. RADIATION DOSE REDUCTION:  This exam was performed according to the departmental dose-optimization program which includes automated exposure control, adjustment of the mA and/or kV according to patient size and/or use of iterative reconstruction technique. CONTRAST:  75mL OMNIPAQUE IOHEXOL 350 MG/ML SOLN COMPARISON:  February 26, 2023.  January 20, 2023. FINDINGS: Cardiovascular: Satisfactory opacification of the pulmonary arteries to the segmental level. No evidence of pulmonary embolism. Mild cardiomegaly. Status post transcatheter aortic valve repair. Coronary artery calcifications are noted. No pericardial effusion. Mediastinum/Nodes: Esophagus is unremarkable. Thyroid gland is unremarkable. 17 mm right hilar lymph node is noted concerning for malignancy. 16 mm pretracheal lymph node is noted concerning for malignancy. 13 mm subcarinal lymph node is noted consistent with malignancy. Lungs/Pleura: No pneumothorax is noted. Interval development of patchy airspace opacities throughout both lungs most consistent with multifocal pneumonia. Minimal left pleural effusion is noted. Upper Abdomen: No acute abnormality. Musculoskeletal: No chest wall abnormality. No acute or significant osseous findings. Review of the MIP images confirms the above findings. IMPRESSION: No definite evidence of pulmonary embolus. Interval development of diffuse patchy airspace opacities throughout both lungs most consistent with multifocal pneumonia. Minimal left pleural effusion. Coronary artery calcifications are noted suggesting coronary artery disease. Mildly enlarged mediastinal lymph nodes are noted concerning for metastatic disease given the history of lung cancer. Aortic Atherosclerosis (ICD10-I70.0). Electronically Signed   By: Lupita Raider M.D.   On: 04/26/2023 16:44   DG Chest Portable 1 View  Result Date: 04/26/2023 CLINICAL DATA:  Hemoptysis. History of lung cancer and atrial fibrillation. EXAM: PORTABLE CHEST 1 VIEW COMPARISON:  CT 01/20/2023.  PET-CT 02/26/2023. Radiographs 03/30/2021 and 03/05/2021. FINDINGS: 1327 hours. Low lung volumes. The heart size and mediastinal contours are grossly stable status post TAVR procedure. There is extensive subpleural reticulation and interstitial prominence in both lungs which has significantly progressed from the 2022 radiographs. Pulmonary findings also appear progressive compared with the chest CT of 3 months ago but similar to the more recent PET-CT of 2 months ago. No confluent airspace disease, large pleural effusion or pneumothorax identified. The bones appear unchanged. Telemetry leads overlie the chest. IMPRESSION: Extensive subpleural reticulation and interstitial prominence in both lungs, progressive compared with the chest CT of 3 months ago. 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 Differential includes atypical infection and lymphangitic tumor based on prior cross-sectional imaging. Continued follow-up recommended. Electronically Signed   By: Carey Bullocks M.D.   On: 04/26/2023 14:19    EKG: Independently reviewed.  Sinus tachycardia at 136 bpm, QTc 503, repolarization abnormality.  Assessment/Plan Active Problems:   GERD   Persistent atrial fibrillation   Peripheral vascular disease (HCC)   Hypertension   Hyperlipemia   Diabetes mellitus (HCC)   COPD (chronic obstructive pulmonary disease) (HCC)   S/P TAVR (transcatheter aortic valve replacement)   S/P thoracotomy   Adenocarcinoma, lung (HCC)   Abdominal aortic aneurysm without rupture (HCC)   Anxiety disorder   Aortic valve disorder   Glaucoma   Presence of prosthetic heart valve   Thrombocytopenia (HCC)   Hemoptysis  Acute respiratory failure with hypoxia Pneumonia versus progressive ILD, rule out pulmonary edema component > Presenting with above progressive shortness of breath, hemoptysis, palpitations. > Imaging suspicious for  progressive ILD which could be pneumonitis secondary to being on Tarceva for lung cancer. > Has been seen by pulmonology in the ED who are following.  Patient is DNR/DNI. > Initially on 2 L to maintain saturations in and has progressed to 4-8L. Has received antibiotic therapy now transition to ceftriaxone/doxycycline, Solu-Medrol, Atrovent, albuterol, tranexamic acid, Lasix. - Monitor on progressive unit - Appreciate pulmonology recommendations and assistance - Continue supple oxygen, wean as tolerated, may progress to BiPAP as needed - Continue with doxycycline and ceftriaxone - Continue with Solu-Medrol - Continue as needed albuterol - Hold off on further IV Lasix for now - Continue tranexamic acid 4 hemoptysis - Hold Eliquis, no chemo prophylaxis for DVT (also thrombocytopenic) - Follow-up urinalysis, ESR, procalcitonin, strep and Legionella urinary antigen, blood cultures  SVT versus RVR Atrial fibrillation > History of atrial fibrillation.  Reporting palpitations and presenting tachycardic in the 120s to 130s.  EKGs appear to be SVT but patient has been started on amiodarone infusion in the ED. > Remains tachycardic, unclear if was in and out of atrial fibrillation that was not captured on EKG.  Either way, likely driven by respiratory failure above. > Will monitor response to amiodarone and continue to wean as tolerated. - Monitoring on progressive unit as above - Continue amiodarone, wean as tolerated - Holding Eliquis as above - Resume home diltiazem and metoprolol  Lung cancer > Known history of recurrent lung cancer.  First diagnosed in 2020 status post resection. > Recurrent in 2022 status post palliative chemotherapy and then placed on Keytruda.  Then Tarceva, Due to genetic profile was switched to Tagrisso. > Continues to follow with oncology. - Tagrisso being held due to concern for pneumonitis secondary to this as above - Supportive care  Diabetes - SSI  PAD -  Continue  home simvastatin - Holding of Eliquis  Hypertension - Continue home diltiazem and metoprolol  Hyperlipidemia - Continue rosuvastatin  Glaucoma - Continue eyedrops  Aortic stenosis status post TAVR - Noted  DVT prophylaxis: SCDs Code Status:   DNR/DNI Family Communication:  Updated at bedside  Disposition Plan:   Patient is from:  Home  Anticipated DC to:  Home  Anticipated DC date:  1 to 7 days  Anticipated DC barriers: None  Consults called:  Pulmonology Admission status:  Observation, progressive  Severity of Illness: The appropriate patient status for this patient is OBSERVATION. Observation status is judged to be reasonable and necessary in order to provide the required intensity of service to ensure the patient's safety. The patient's presenting symptoms, physical exam findings, and initial radiographic and laboratory data in the context of their medical condition is felt to place them at decreased risk for further clinical deterioration. Furthermore, it is anticipated that the patient will be medically stable for discharge from the hospital within 2 midnights of admission.    Synetta Fail MD [facility]  How to contact the [facility] Attending or Consulting provider 7A - 7P or covering provider during after hours 7P -7A, for this patient?   Check the care team in [facility] and look for a) attending/consulting [facility] provider listed and b) the [facility] team listed Log into [email or web address] and use [place]'s universal password to access. If you do not have the password, please contact the hospital operator. Locate the [facility] provider you are looking for under [facility] and page to a number that you can be directly reached. If you still have difficulty reaching the provider, please page the [facility] (Director on Call) for the Hospitalists listed on amion for assistance.  04/26/2023, 6:34 PM

## 2023-04-26 NOTE — Telephone Encounter (Signed)
Pt reported chest pain and coughing up blood. Stephanie instructed to call 911.

## 2023-04-26 NOTE — Progress Notes (Signed)
ED Pharmacy Antibiotic Sign Off An antibiotic consult was received from an ED provider for cefepime and vancomycin per pharmacy dosing for sepsis. A chart review was completed to assess appropriateness.  The following one time order(s) were placed per pharmacy consult:  cefepime 2000 mg x 1 dose vancomycin 1250 mg x 1 dose  Further antibiotic and/or antibiotic pharmacy consults should be ordered by the admitting provider if indicated.   Thank you for allowing pharmacy to be a part of this patient's care.   Delmar Landau, PharmD, BCPS 04/26/2023 1:35 PM ED Clinical Pharmacist -  [phone]

## 2023-04-26 NOTE — ED Triage Notes (Signed)
Patient arrived from home with increased hemoptysis and feeling like heart racing with any exertion and increased exertional sob. Patient reports that he has lung cancer and currently taking po chemo pills for same. Hx of atrial fib but normally rate controlled. No fever, no chills. Alert and oriented. Sats 84 and placed on oxygen at 3l. Denies pain. Complains of increased weakness in the mornings

## 2023-04-26 NOTE — Consult Note (Signed)
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 SP wedge resection LUL. With disease recurrence and September 2022 with metastatic mediastinal lymphadenopathy as well as left hilar and left pleural metastatic disease . He was maintained on Tarceva for 20 months  and was placed on Tagrisso 80 mg p.o. daily on ~03/05/23 He has had a weight loss of about 35 pounds . Additional health history includes COPD, Progressive ILD, and atrial fibrillation on Eliquis.  Pt. Presented to the ED 11/25 with hypoxic respiratory failure with hemoptysis. He states he has had shortness of breath for about 3 weeks , and then 11/24 he started coughing up thick blood tinges mucus. CXR was + for progressive ILD and inflammation , vs atypical infection vs volume overload. He was treated with oxygen, antibiotics in the ED. [facility] will be admitting. PCCM was consulted for a Pulmonary consult.   Pertinent  Medical History   Past Medical History:  Diagnosis Date   AAA (abdominal aortic aneurysm) (HCC)    Anxiety    Arthritis    Asthma    when younger   Atrial fibrillation (HCC)    CAD (coronary artery disease)    Cataract    removed both   COPD (chronic obstructive pulmonary disease) (HCC)    Diabetes mellitus    Type II   Dysrhythmia    afib   Esophageal reflux    Family history of malignant neoplasm of gastrointestinal tract    Heart murmur    Hiatal hernia    Hyperlipemia    Hypertension    Lung nodule    a. PET scan highly suspicious for malignancy. Bronch with biopsy to be done after TAVR   Malignant neoplasm of prostate [facility])    prostate    Peripheral vascular disease (HCC)    Pneumonia    Stricture and stenosis of  esophagus      Significant Hospital Events: Including procedures, antibiotic start and stop dates in addition to other pertinent events   04/26/2023 Admission to [facility] for acute respiratory Failure  Interim History / Subjective:  Currently on 2 L [place] with sats of 93%. No complaints  Objective   Blood pressure 99/71, pulse (!) 134, temperature 98.7 F (37.1 C), temperature source Oral, resp. rate (!) 29, height 5\' 9"  (1.753 m), weight 65.8 kg, SpO2 93%.       No intake or output data in the 24 hours ending 04/26/23 1448 Filed Weights   04/26/23 1246  Weight: 65.8 kg    Examination: General:  Awake and alert, supine on stretcher in ED, NAD HENT: NCAT, No LAD, MM Pink and dry Lungs:  Bilateral chest excursion , Coarse Rhonchi, diminished per bases Cardiovascular:  S1, S2, Irr rate and rhythm, ST per tele Abdomen:  Soft NT, ND, BS +, Body mass index is 21.41 kg/m.  Extremities:  No obvious deformities Neuro: Awake and alert and oriented x 3, Appropriate GU:  Not assessed  Resolved Hospital Problem list     Assessment & Plan:  Acute Respiratory Failure in the setting of NSC Lung Cancer, Progressive ILD, Infection vs Fluid fluid overload.  Plan Titrate oxygen for sats > 88% Monitor work of breathing  CXR and ABG prn CTA  Chest as ordered Start Doxycycline and Rocephin  Trend fever curve and WBC Lasix 40 mg IV now Solumedrol 40 mg BID Urine for Legionella  and strep UA Pro calcitonin now Swallow eval to Rule out aspiration  Check BNP Check Sed rate Trend and Maintain Mag > 2  Pt agreed to DNR status and would not want to be intubated if he declines to the point of needing intubation   Hemoptysis of 1 day duration in setting of NSC Lung Cancer / ? Atypical infection / Related to  current chemo Stable CBC Plan CTA to RO PE ( Low thresh hold as patient is on Eliquis and states he has been compliant) Stop Eliquis for now Trend CBC  Monitor for bleeding TXA Nebs Q 8   PCCM will continue to follow  Bevelyn Ngo, MSN, AGACNP-BC [place] [facility]/Critical [facility] Medicine See Amion for personal pager PCCM on call pager [phone]  04/26/2023 3:40 PM     Best Practice (right click and "Reselect all SmartList Selections" daily)   Per PrimaryTeam Daughter was updated by Dr. Katrinka Blazing at bedside 11/25 Labs   CBC: Recent Labs  Lab 04/26/23 1255  WBC 12.1*  HGB 11.8*  HCT 36.3*  MCV 93.1  PLT 42*    Basic Metabolic Panel: Recent Labs  Lab 04/26/23 1255  NA 132*  K 3.8  CL 102  CO2 20*  GLUCOSE 130*  BUN 24*  CREATININE 0.92  CALCIUM 9.2   GFR: Estimated Creatinine Clearance: 59.6 mL/min (by C-G formula based on SCr of 0.92 mg/dL). Recent Labs  Lab 04/26/23 1255 04/26/23 1310  WBC 12.1*  --   LATICACIDVEN  --  1.0    Liver Function Tests: Recent Labs  Lab 04/26/23 1255  AST 37  ALT 17  ALKPHOS 84  BILITOT 1.5*  PROT 7.4  ALBUMIN 3.1*   No results for input(s): "LIPASE", "AMYLASE" in the last 168 hours. No results for input(s): "AMMONIA" in the last 168 hours.  ABG    Component Value Date/Time   PHART 7.379 02/03/2019 0424   PCO2ART 39.4 02/03/2019 0424   PO2ART 92.0 02/03/2019 0424   HCO3 23.2 02/03/2019 0424   TCO2 24 02/03/2019 0424   ACIDBASEDEF 2.0 02/03/2019 0424   O2SAT 97.0 02/03/2019 0424     Coagulation Profile: Recent Labs  Lab 04/26/23 1255  INR 2.0*    Cardiac Enzymes: No results for input(s): "CKTOTAL", "CKMB", "CKMBINDEX", "TROPONINI" in the last 168 hours.  HbA1C: Hgb A1c MFr Bld  Date/Time Value Ref Range Status  01/25/2023 04:42 PM 6.6 (H) 4.8 - 5.6 % Final    Comment:    (NOTE) Pre diabetes:          5.7%-6.4%  Diabetes:              >6.4%  Glycemic control for   <7.0% adults with diabetes   01/30/2019 03:57 PM 7.1 (H) 4.8 - 5.6 % Final    Comment:    (NOTE) Pre diabetes:          5.7%-6.4% Diabetes:              >6.4% Glycemic control for   <7.0% adults with  diabetes     CBG: No results for input(s): "GLUCAP" in the last 168 hours.  Review of Systems:   Shortness of breath, hemoptysis, weakness   Past Medical History:  He,  has a past medical history of AAA (abdominal aortic aneurysm) (HCC), Anxiety, Arthritis, Asthma, Atrial fibrillation (  HCC), CAD (coronary artery disease), Cataract, COPD (chronic obstructive pulmonary disease) (HCC), Diabetes mellitus, Dysrhythmia, Esophageal reflux, Family history of malignant neoplasm of gastrointestinal tract, Heart murmur, Hiatal hernia, Hyperlipemia, Hypertension, Lung nodule, Malignant neoplasm of prostate (HCC), Peripheral vascular disease (HCC), Pneumonia, and Stricture and stenosis of esophagus.   Surgical History:   Past Surgical History:  Procedure Laterality Date   ABDOMINAL AORTA STENT     CARDIAC CATHETERIZATION     CARDIOVERSION N/A 12/04/2015   Procedure: CARDIOVERSION;  Surgeon: Thurmon Fair, MD;  Location: [facility] ENDOSCOPY;  Service: Cardiovascular;  Laterality: N/A;   COLONOSCOPY     FINGER SURGERY Right    middle finger- amputation   INSERTION PROSTATE RADIATION SEED     MEDIASTINOSCOPY N/A 01/06/2019   Procedure: MEDIASTINOSCOPY WITH BIOPIES;  Surgeon: Alleen Borne, MD;  Location: [facility] OR;  Service: Thoracic;  Laterality: N/A;   RIGHT/LEFT HEART CATH AND CORONARY ANGIOGRAPHY N/A 10/26/2018   Procedure: RIGHT/LEFT HEART CATH AND CORONARY ANGIOGRAPHY;  Surgeon: Kathleene Hazel, MD;  Location: [facility] INVASIVE CV LAB;  Service: Cardiovascular;  Laterality: N/A;   TEE WITHOUT CARDIOVERSION N/A 12/06/2018   Procedure: TRANSESOPHAGEAL ECHOCARDIOGRAM (TEE);  Surgeon: Kathleene Hazel, MD;  Location: [facility] INVASIVE CV LAB;  Service: Open Heart Surgery;  Laterality: N/A;   TRANSCATHETER AORTIC VALVE REPLACEMENT, TRANSFEMORAL N/A 12/06/2018   Procedure: TRANSCATHETER AORTIC VALVE REPLACEMENT, TRANSFEMORAL;  Surgeon: Kathleene Hazel, MD;  Location: [facility] INVASIVE CV LAB;  Service: Open  Heart Surgery;  Laterality: N/A;   UPPER GASTROINTESTINAL ENDOSCOPY     VIDEO ASSISTED THORACOSCOPY (VATS)/THOROCOTOMY Left 02/02/2019   Procedure: VIDEO ASSISTED THORACOSCOPY (VATS)/THOROCOTOMY;  Surgeon: Alleen Borne, MD;  Location: [facility] OR;  Service: Thoracic;  Laterality: Left;   VIDEO BRONCHOSCOPY N/A 01/06/2019   Procedure: VIDEO BRONCHOSCOPY;  Surgeon: Alleen Borne, MD;  Location: [facility] OR;  Service: Thoracic;  Laterality: N/A;   VIDEO BRONCHOSCOPY WITH ENDOBRONCHIAL ULTRASOUND N/A 03/07/2021   Procedure: VIDEO BRONCHOSCOPY WITH ENDOBRONCHIAL ULTRASOUND;  Surgeon: Loreli Slot, MD;  Location: [facility] OR;  Service: Thoracic;  Laterality: N/A;   WEDGE RESECTION Left 02/02/2019   Procedure: LUNG RESECTION;  Surgeon: Alleen Borne, MD;  Location: [facility] OR;  Service: Thoracic;  Laterality: Left;     Social History:   reports that he quit smoking about 4 years ago. His smoking use included pipe and cigars. He has never used smokeless tobacco. He reports that he does not currently use alcohol. He reports that he does not use drugs.   Family History:  His family history includes Colon cancer in his father; Deep vein thrombosis in his mother; Diabetes in his mother; Heart attack in his brother; Heart disease in his mother; Hyperlipidemia in his mother; Hypertension in his brother and mother; Prostate cancer in his brother; Varicose Veins in his mother. There is no history of Colon polyps, Esophageal cancer, Rectal cancer, or Stomach cancer.   Allergies Allergies  Allergen Reactions   Macrolides And Ketolides Other (See Comments)    Pt unsure of this      Home Medications  Prior to Admission medications   Medication Sig Start Date End Date Taking? Authorizing Provider  diltiazem (CARDIZEM SR) 60 MG 12 hr capsule Take 1 capsule (60 mg total) by mouth every 12 (twelve) hours. 06/06/19  Yes Janetta Hora, PA-C  ferrous sulfate 325 (65 FE) MG tablet Take 325 mg by mouth daily with breakfast.    Yes [provider]  glimepiride (AMARYL) 2 MG tablet  Take 2 mg by mouth daily with breakfast.   Yes [provider]  JARDIANCE 25 MG TABS tablet Take 25 mg by mouth daily. 03/01/21  Yes [provider]  metFORMIN (GLUCOPHAGE-XR) 500 MG 24 hr tablet Take 1,000 mg by mouth every evening.  07/01/18  Yes [provider]  metoprolol succinate (TOPROL-XL) 50 MG 24 hr tablet Take 25 mg by mouth daily. 03/10/23  Yes [provider]  osimertinib mesylate (TAGRISSO) 80 MG tablet Take 1 tablet (80 mg total) by mouth daily. 03/04/23  Yes Si Gaul, MD  predniSONE (DELTASONE) 10 MG tablet Take 1 tablet (10 mg total) by mouth daily with breakfast. 04/14/23  Yes Heilingoetter, Cassandra L, PA-C  simvastatin (ZOCOR) 40 MG tablet Take 40 mg by mouth at bedtime.   Yes [provider]  XARELTO 20 MG TABS tablet TAKE 1 TABLET BY MOUTH  DAILY WITH SUPPER 12/20/17  Yes Jake Bathe, MD  ACCU-CHEK GUIDE test strip USE TO CHECK YOUR BLOOD SUGAR ONCE DAILY 03/28/21   [provider]  dorzolamide-timolol (COSOPT) 22.3-6.8 MG/ML ophthalmic solution Place 1 drop into both eyes in the morning, at noon, and at bedtime. Patient not taking: Reported on 04/26/2023 06/05/19   [provider]  mirtazapine (REMERON) 15 MG tablet Take 1 tablet (15 mg total) by mouth at bedtime. Patient not taking: Reported on 04/26/2023 01/13/23   Heilingoetter, Cassandra L, PA-C  Multiple Vitamins-Minerals (MULTIVITAMIN WITH MINERALS) tablet Take 1 tablet by mouth daily. Patient not taking: Reported on 04/26/2023    [provider]  Multiple Vitamins-Minerals (ZINC PO) Take 1 tablet by mouth daily. Patient not taking: Reported on 04/26/2023    [provider]  prochlorperazine (COMPAZINE) 10 MG tablet Take 1 tablet (10 mg total) by mouth every 6 (six) hours as needed for nausea or vomiting. Patient not taking: Reported on 04/26/2023 01/13/23   Heilingoetter,  Cassandra L, PA-C  saline (AYR) GEL Place 1 Application into both nostrils every 4 (four) hours as needed. 03/19/23   Read Drivers, MD     Pulmonary APP Time 40 minutes    Bevelyn Ngo, MSN, AGACNP-BC [facility] Pulmonary/Critical Care Medicine See Amion for personal pager PCCM on call pager [phone]  04/26/2023 3:48 PM

## 2023-04-26 NOTE — ED Provider Notes (Signed)
Signed out to admit when CTA resulted, and that cardiology and pulmonary previously consulted. CT neg for PE, but progression of interstitial disease noted.   Medicine consulted for admission.    Cathren Laine, MD 04/26/23 1725

## 2023-04-26 NOTE — ED Notes (Signed)
Patient transported to CT 

## 2023-04-26 NOTE — ED Notes (Signed)
EDP at bedside  

## 2023-04-26 NOTE — ED Provider Notes (Signed)
Vincent Velez EMERGENCY DEPARTMENT AT [facility] Provider Note   CSN: [id number] Arrival date & time: 04/26/23  1239     History  No chief complaint on file.   Vincent Velez is a 82 y.o. male.  HPI     82 year old male with a history of recurrent metastatic non-small cell lung cancer, type 2 diabetes, hypertension, hyperlipidemia, COPD, peripheral vascular disease, atrial fibrillation on Eliquis who presents with concern for shortness of breath, fatigue, hemoptysis and heart racing.   Reports that yesterday began to feel this way and it progressively worsened throughout the night and this morning.  Describes shortness of breath.  He has had a cough that has worsened from his baseline, and has been coughing up mucus with blood.  He describes the mucus itself as tinged red, but denies any blood clots being coughed up.  Denies leg pain or swelling, fever.  He has both chest and back pain, describing the chest pain as a dull pain and pain in his back.  The pain is not pleuritic.  He has not had nausea, vomiting, abdominal pain.  He has not tried any breathing treatments at home.     Past Medical History:  Diagnosis Date   AAA (abdominal aortic aneurysm) (HCC)    Anxiety    Arthritis    Asthma    when younger   Atrial fibrillation (HCC)    CAD (coronary artery disease)    Cataract    removed both   COPD (chronic obstructive pulmonary disease) (HCC)    Diabetes mellitus    Type II   Dysrhythmia    afib   Esophageal reflux    Family history of malignant neoplasm of gastrointestinal tract    Heart murmur    Hiatal hernia    Hyperlipemia    Hypertension    Lung nodule    a. PET scan highly suspicious for malignancy. Bronch with biopsy to be done after TAVR   Malignant neoplasm of prostate [facility])    prostate    Peripheral vascular disease (HCC)    Pneumonia    Stricture and stenosis of esophagus      Home Medications Prior to Admission medications    Medication Sig Start Date End Date Taking? Authorizing Provider  diltiazem (CARDIZEM SR) 60 MG 12 hr capsule Take 1 capsule (60 mg total) by mouth every 12 (twelve) hours. 06/06/19  Yes Janetta Hora, PA-C  ferrous sulfate 325 (65 FE) MG tablet Take 325 mg by mouth daily with breakfast.   Yes [provider]  glimepiride (AMARYL) 2 MG tablet Take 2 mg by mouth daily with breakfast.   Yes [provider]  JARDIANCE 25 MG TABS tablet Take 25 mg by mouth daily. 03/01/21  Yes [provider]  metFORMIN (GLUCOPHAGE-XR) 500 MG 24 hr tablet Take 1,000 mg by mouth every evening.  07/01/18  Yes [provider]  metoprolol succinate (TOPROL-XL) 50 MG 24 hr tablet Take 25 mg by mouth daily. 03/10/23  Yes [provider]  osimertinib mesylate (TAGRISSO) 80 MG tablet Take 1 tablet (80 mg total) by mouth daily. 03/04/23  Yes Si Gaul, MD  predniSONE (DELTASONE) 10 MG tablet Take 1 tablet (10 mg total) by mouth daily with breakfast. 04/14/23  Yes Heilingoetter, Cassandra L, PA-C  simvastatin (ZOCOR) 40 MG tablet Take 40 mg by mouth at bedtime.   Yes [provider]  XARELTO 20 MG TABS tablet TAKE 1 TABLET BY MOUTH  DAILY WITH  SUPPER 12/20/17  Yes Jake Bathe, MD  ACCU-CHEK GUIDE test strip USE TO CHECK YOUR BLOOD SUGAR ONCE DAILY 03/28/21   [provider]  dorzolamide-timolol (COSOPT) 22.3-6.8 MG/ML ophthalmic solution Place 1 drop into both eyes in the morning, at noon, and at bedtime. Patient not taking: Reported on 04/26/2023 06/05/19   [provider]  mirtazapine (REMERON) 15 MG tablet Take 1 tablet (15 mg total) by mouth at bedtime. Patient not taking: Reported on 04/26/2023 01/13/23   Heilingoetter, Cassandra L, PA-C  Multiple Vitamins-Minerals (MULTIVITAMIN WITH MINERALS) tablet Take 1 tablet by mouth daily. Patient not taking: Reported on 04/26/2023    [provider]  Multiple Vitamins-Minerals (ZINC PO) Take 1  tablet by mouth daily. Patient not taking: Reported on 04/26/2023    [provider]  prochlorperazine (COMPAZINE) 10 MG tablet Take 1 tablet (10 mg total) by mouth every 6 (six) hours as needed for nausea or vomiting. Patient not taking: Reported on 04/26/2023 01/13/23   Heilingoetter, Cassandra L, PA-C  saline (AYR) GEL Place 1 Application into both nostrils every 4 (four) hours as needed. 03/19/23   Read Drivers, MD      Allergies    Macrolides and ketolides    Review of Systems   Review of Systems  Physical Exam Updated Vital Signs BP 103/67   Pulse (!) 121   Temp 98.3 F (36.8 C) (Oral)   Resp 20   Ht 5\' 9"  (1.753 m)   Wt 65.8 kg   SpO2 100%   BMI 21.41 kg/m  Physical Exam Vitals and nursing note reviewed.  Constitutional:      General: He is not in acute distress.    Appearance: He is well-developed. He is ill-appearing. He is not diaphoretic.  HENT:     Head: Normocephalic and atraumatic.  Eyes:     Conjunctiva/sclera: Conjunctivae normal.  Cardiovascular:     Rate and Rhythm: Regular rhythm. Tachycardia present.     Pulses: Normal pulses.     Heart sounds: Normal heart sounds. No murmur heard.    No friction rub. No gallop.  Pulmonary:     Effort: Pulmonary effort is normal. No respiratory distress.     Breath sounds: Rales present. No wheezing.  Abdominal:     General: There is no distension.     Palpations: Abdomen is soft.     Tenderness: There is no abdominal tenderness. There is no guarding.  Musculoskeletal:     Cervical back: Normal range of motion.  Skin:    General: Skin is warm and dry.  Neurological:     Mental Status: He is alert and oriented to person, place, and time.     ED Results / Procedures / Treatments   Labs (all labs ordered are listed, but only abnormal results are displayed) Labs Reviewed  CBC - Abnormal; Notable for the following components:      Result Value   WBC 12.1 (*)    RBC 3.90 (*)    Hemoglobin 11.8  (*)    HCT 36.3 (*)    RDW 15.8 (*)    Platelets 42 (*)    All other components within normal limits  PROTIME-INR - Abnormal; Notable for the following components:   Prothrombin Time 22.7 (*)    INR 2.0 (*)    All other components within normal limits  COMPREHENSIVE METABOLIC PANEL - Abnormal; Notable for the following components:   Sodium 132 (*)    CO2 20 (*)  Glucose, Bld 130 (*)    BUN 24 (*)    Albumin 3.1 (*)    Total Bilirubin 1.5 (*)    All other components within normal limits  BRAIN NATRIURETIC PEPTIDE - Abnormal; Notable for the following components:   B Natriuretic Peptide 158.0 (*)    All other components within normal limits  CBC WITH DIFFERENTIAL/PLATELET - Abnormal; Notable for the following components:   WBC 11.8 (*)    RBC 3.91 (*)    Hemoglobin 12.0 (*)    HCT 36.9 (*)    RDW 15.8 (*)    Platelets 41 (*)    Neutro Abs 10.1 (*)    All other components within normal limits  TROPONIN I (HIGH SENSITIVITY) - Abnormal; Notable for the following components:   Troponin I (High Sensitivity) 36 (*)    All other components within normal limits  TROPONIN I (HIGH SENSITIVITY) - Abnormal; Notable for the following components:   Troponin I (High Sensitivity) 37 (*)    All other components within normal limits  RESP PANEL BY RT-PCR (RSV, FLU A&B, COVID)  RVPGX2  CULTURE, BLOOD (ROUTINE X 2)  CULTURE, BLOOD (ROUTINE X 2)  MAGNESIUM  URINALYSIS, W/ REFLEX TO CULTURE (INFECTION SUSPECTED)  SEDIMENTATION RATE  BRAIN NATRIURETIC PEPTIDE  PROCALCITONIN  URINALYSIS, ROUTINE W REFLEX MICROSCOPIC  STREP PNEUMONIAE URINARY ANTIGEN  LEGIONELLA PNEUMOPHILA SEROGP 1 UR AG  COMPREHENSIVE METABOLIC PANEL  CBC  I-STAT CG4 LACTIC ACID, ED  I-STAT CG4 LACTIC ACID, ED  TYPE AND SCREEN    EKG None  Radiology CT Angio Chest PE W and/or Wo Contrast  Result Date: 04/26/2023 CLINICAL DATA:  Hemoptysis.  History of lung cancer. EXAM: CT ANGIOGRAPHY CHEST WITH CONTRAST  TECHNIQUE: Multidetector CT imaging of the chest was performed using the standard protocol during bolus administration of intravenous contrast. Multiplanar CT image reconstructions and MIPs were obtained to evaluate the vascular anatomy. RADIATION DOSE REDUCTION: This exam was performed according to the departmental dose-optimization program which includes automated exposure control, adjustment of the mA and/or kV according to patient size and/or use of iterative reconstruction technique. CONTRAST:  75mL OMNIPAQUE IOHEXOL 350 MG/ML SOLN COMPARISON:  February 26, 2023.  January 20, 2023. FINDINGS: Cardiovascular: Satisfactory opacification of the pulmonary arteries to the segmental level. No evidence of pulmonary embolism. Mild cardiomegaly. Status post transcatheter aortic valve repair. Coronary artery calcifications are noted. No pericardial effusion. Mediastinum/Nodes: Esophagus is unremarkable. Thyroid gland is unremarkable. 17 mm right hilar lymph node is noted concerning for malignancy. 16 mm pretracheal lymph node is noted concerning for malignancy. 13 mm subcarinal lymph node is noted consistent with malignancy. Lungs/Pleura: No pneumothorax is noted. Interval development of patchy airspace opacities throughout both lungs most consistent with multifocal pneumonia. Minimal left pleural effusion is noted. Upper Abdomen: No acute abnormality. Musculoskeletal: No chest wall abnormality. No acute or significant osseous findings. Review of the MIP images confirms the above findings. IMPRESSION: No definite evidence of pulmonary embolus. Interval development of diffuse patchy airspace opacities throughout both lungs most consistent with multifocal pneumonia. Minimal left pleural effusion. Coronary artery calcifications are noted suggesting coronary artery disease. Mildly enlarged mediastinal lymph nodes are noted concerning for metastatic disease given the history of lung cancer. Aortic Atherosclerosis  (ICD10-I70.0). Electronically Signed   By: Lupita Raider M.D.   On: 04/26/2023 16:44   DG Chest Portable 1 View  Result Date: 04/26/2023 CLINICAL DATA:  Hemoptysis. History of lung cancer and atrial fibrillation. EXAM: PORTABLE CHEST 1 VIEW COMPARISON:  CT 01/20/2023. PET-CT 02/26/2023. Radiographs 03/30/2021 and 03/05/2021. FINDINGS: 1327 hours. Low lung volumes. The heart size and mediastinal contours are grossly stable status post TAVR procedure. There is extensive subpleural reticulation and interstitial prominence in both lungs which has significantly progressed from the 2022 radiographs. Pulmonary findings also appear progressive compared with the chest CT of 3 months ago but similar to the more recent PET-CT of 2 months ago. No confluent airspace disease, large pleural effusion or pneumothorax identified. The bones appear unchanged. Telemetry leads overlie the chest. IMPRESSION: Extensive subpleural reticulation and interstitial prominence in both lungs, progressive compared with the chest CT of 3 months ago. 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 Differential includes atypical infection and lymphangitic tumor based on prior cross-sectional imaging. Continued follow-up recommended. Electronically Signed   By: Carey Bullocks M.D.   On: 04/26/2023 14:19    Procedures .Critical Care  Performed by: Alvira Monday, MD Authorized by: Alvira Monday, MD   Critical care provider statement:    Critical care time (minutes):  30   Critical care was time spent personally by me on the following activities:  Development of treatment plan with patient or surrogate, discussions with consultants, evaluation of patient's response to treatment, examination of patient, ordering and review of laboratory studies, ordering and review of radiographic studies, ordering and performing treatments and interventions, pulse  oximetry, re-evaluation of patient's condition and review of old charts     Medications Ordered in ED Medications  amiodarone (NEXTERONE) 1.8 mg/mL load via infusion 150 mg (150 mg Intravenous Bolus from Bag 04/26/23 1543)    Followed by  amiodarone (NEXTERONE PREMIX) 360-4.14 MG/200ML-% (1.8 mg/mL) IV infusion (60 mg/hr Intravenous New Bag/Given 04/26/23 1926)    Followed by  amiodarone (NEXTERONE PREMIX) 360-4.14 MG/200ML-% (1.8 mg/mL) IV infusion (has no administration in time range)  methylPREDNISolone sodium succinate (SOLU-MEDROL) 40 mg/mL injection 40 mg (40 mg Intravenous Given 04/26/23 1712)  tranexamic acid (CYKLOKAPRON) 1000 MG/10ML nebulizer solution 500 mg (500 mg Nebulization Given 04/26/23 2126)  cefTRIAXone (ROCEPHIN) 2 g in sodium chloride 0.9 % 100 mL IVPB (0 g Intravenous Stopped 04/26/23 1829)  doxycycline (VIBRA-TABS) tablet 100 mg (100 mg Oral Given 04/26/23 1712)  diltiazem (CARDIZEM SR) 12 hr capsule 60 mg (has no administration in time range)  metoprolol succinate (TOPROL-XL) 24 hr tablet 25 mg (has no administration in time range)  simvastatin (ZOCOR) tablet 40 mg (has no administration in time range)  sodium chloride flush (NS) 0.9 % injection 3 mL (has no administration in time range)  acetaminophen (TYLENOL) tablet 650 mg (has no administration in time range)    Or  acetaminophen (TYLENOL) suppository 650 mg (has no administration in time range)  polyethylene glycol (MIRALAX / GLYCOLAX) packet 17 g (has no administration in time range)  insulin aspart (novoLOG) injection 0-9 Units (has no administration in time range)  ceFEPIme (MAXIPIME) 2 g in sodium chloride 0.9 % 100 mL IVPB (0 g Intravenous Stopped 04/26/23 1457)  vancomycin (VANCOREADY) IVPB 1250 mg/250 mL (0 mg Intravenous Stopped 04/26/23 1712)  amiodarone (NEXTERONE) 1.5 mg/mL IV bolus only 150 mg (0 mg Intravenous Stopped 04/26/23 1421)  furosemide (LASIX) injection 40 mg (40 mg Intravenous Given  04/26/23 1715)  iohexol (OMNIPAQUE) 350 MG/ML injection 75 mL (75 mLs Intravenous Contrast Given 04/26/23 1607)  albuterol (PROVENTIL) (2.5 MG/3ML) 0.083% nebulizer solution 5 mg (5 mg Nebulization Given 04/26/23 1821)  ipratropium (ATROVENT HFA) inhaler 2 puff (2 puffs  Inhalation Given 04/26/23 1830)    ED Course/ Medical Decision Making/ A&P                                     82 year old male with a history of recurrent metastatic non-small cell lung cancer, type 2 diabetes, hypertension, hyperlipidemia, COPD, peripheral vascular disease, atrial fibrillation on Eliquis who presents with concern for shortness of breath, fatigue, hemoptysis and heart racing.  Differential diagnosis for dyspnea includes ACS, PE, COPD exacerbation, CHF exacerbation, anemia, pneumonia, viral etiology such as COVID 19 infection, metabolic abnormality, dyspnea from arrhythmia leading to CHF, dyspnea from other source of bleeding, side effect/interstitial lung disease/pneumonitis related to his Tagrisso therapy.  Arrives to the ED tachycardia with hypoxia to 84%, started on 2L [place].   Chest x-ray was done and evaluated by me and showed bilateral opacities and per radiology read may be infection or progressive interstitial lung disease secondary to cancer treatment   EKG was evaluated by me which showed sinus tachycardia versus atrial flutter, on further review HR is stable 130s and believe more consistent with atrial flutter.  Given concern for possible underlying heart failure and BP could not tolerate rate control gve dose of amiodarone. Discussed with Dr. Servando [facility] who agrees that ECG consistent with flutter and agrees with amiodarone therapy.  Given chest XR, concern for possible pneumonia, cancer hx, covered for possible HCAP with vancomycin/cefepime.   Labs completed and evaluated/interpreted by me show BNP mildly elevated however more elevated than previous from 72 to 158, COVID /flu/RSV negative,  troponin mildly elevated and stable not consistent with ACS. Mg normal. Hgb slightly decreased from prior, leukocytosis present, thrombocytopenia is stable, no clinically significant electrolyte abnormalities.   DDx for acute respiratory failure iwht hypoxia still contains pneumonia, CHF in setting of atrial flutter with RVR, interstitial lung disease related to cancer medications, PE.  CT PE study ordered and pending.    Spoke to Dr. Servando [facility] Cardiology regarding atrial flutter. Spoke with Kandice Robinsons NP with pulmonology regarding his hemoptysis on eliquis.  Signed out with imaging pending.          Final Clinical Impression(s) / ED Diagnoses Final diagnoses:  Acute and chronic respiratory failure with hypoxia (HCC)  Acute pneumonia  Malignant neoplasm of lung, unspecified laterality, unspecified part of lung (HCC)  Atrial flutter, unspecified type (HCC)  Hemoptysis    Rx / DC Orders ED Discharge Orders     None         Alvira Monday, MD 04/26/23 2149

## 2023-04-27 ENCOUNTER — Observation Stay (HOSPITAL_BASED_OUTPATIENT_CLINIC_OR_DEPARTMENT_OTHER): Payer: Medicare Other

## 2023-04-27 ENCOUNTER — Inpatient Hospital Stay: Payer: Medicare Other | Admitting: Licensed Clinical Social Worker

## 2023-04-27 ENCOUNTER — Encounter (HOSPITAL_COMMUNITY): Payer: Self-pay | Admitting: Internal Medicine

## 2023-04-27 DIAGNOSIS — I4821 Permanent atrial fibrillation: Secondary | ICD-10-CM | POA: Diagnosis present

## 2023-04-27 DIAGNOSIS — I4892 Unspecified atrial flutter: Secondary | ICD-10-CM | POA: Diagnosis present

## 2023-04-27 DIAGNOSIS — R042 Hemoptysis: Secondary | ICD-10-CM | POA: Diagnosis present

## 2023-04-27 DIAGNOSIS — E785 Hyperlipidemia, unspecified: Secondary | ICD-10-CM | POA: Diagnosis present

## 2023-04-27 DIAGNOSIS — I352 Nonrheumatic aortic (valve) stenosis with insufficiency: Secondary | ICD-10-CM | POA: Diagnosis present

## 2023-04-27 DIAGNOSIS — J80 Acute respiratory distress syndrome: Secondary | ICD-10-CM | POA: Diagnosis not present

## 2023-04-27 DIAGNOSIS — Z66 Do not resuscitate: Secondary | ICD-10-CM | POA: Diagnosis present

## 2023-04-27 DIAGNOSIS — I351 Nonrheumatic aortic (valve) insufficiency: Secondary | ICD-10-CM

## 2023-04-27 DIAGNOSIS — C771 Secondary and unspecified malignant neoplasm of intrathoracic lymph nodes: Secondary | ICD-10-CM | POA: Diagnosis present

## 2023-04-27 DIAGNOSIS — D696 Thrombocytopenia, unspecified: Secondary | ICD-10-CM | POA: Diagnosis present

## 2023-04-27 DIAGNOSIS — I471 Supraventricular tachycardia, unspecified: Secondary | ICD-10-CM | POA: Diagnosis present

## 2023-04-27 DIAGNOSIS — R0602 Shortness of breath: Secondary | ICD-10-CM

## 2023-04-27 DIAGNOSIS — E1151 Type 2 diabetes mellitus with diabetic peripheral angiopathy without gangrene: Secondary | ICD-10-CM | POA: Diagnosis present

## 2023-04-27 DIAGNOSIS — Z515 Encounter for palliative care: Secondary | ICD-10-CM | POA: Diagnosis not present

## 2023-04-27 DIAGNOSIS — E43 Unspecified severe protein-calorie malnutrition: Secondary | ICD-10-CM | POA: Diagnosis present

## 2023-04-27 DIAGNOSIS — J9621 Acute and chronic respiratory failure with hypoxia: Secondary | ICD-10-CM | POA: Diagnosis present

## 2023-04-27 DIAGNOSIS — C3490 Malignant neoplasm of unspecified part of unspecified bronchus or lung: Secondary | ICD-10-CM | POA: Diagnosis present

## 2023-04-27 DIAGNOSIS — Z95828 Presence of other vascular implants and grafts: Secondary | ICD-10-CM | POA: Diagnosis not present

## 2023-04-27 DIAGNOSIS — I5020 Unspecified systolic (congestive) heart failure: Secondary | ICD-10-CM | POA: Diagnosis not present

## 2023-04-27 DIAGNOSIS — J704 Drug-induced interstitial lung disorders, unspecified: Secondary | ICD-10-CM | POA: Diagnosis present

## 2023-04-27 DIAGNOSIS — J84112 Idiopathic pulmonary fibrosis: Secondary | ICD-10-CM | POA: Diagnosis present

## 2023-04-27 DIAGNOSIS — I483 Typical atrial flutter: Secondary | ICD-10-CM | POA: Diagnosis not present

## 2023-04-27 DIAGNOSIS — C782 Secondary malignant neoplasm of pleura: Secondary | ICD-10-CM | POA: Diagnosis present

## 2023-04-27 DIAGNOSIS — R64 Cachexia: Secondary | ICD-10-CM | POA: Diagnosis present

## 2023-04-27 DIAGNOSIS — J9601 Acute respiratory failure with hypoxia: Secondary | ICD-10-CM | POA: Diagnosis present

## 2023-04-27 DIAGNOSIS — J449 Chronic obstructive pulmonary disease, unspecified: Secondary | ICD-10-CM

## 2023-04-27 DIAGNOSIS — Z7189 Other specified counseling: Secondary | ICD-10-CM | POA: Diagnosis not present

## 2023-04-27 DIAGNOSIS — J440 Chronic obstructive pulmonary disease with acute lower respiratory infection: Secondary | ICD-10-CM | POA: Diagnosis present

## 2023-04-27 DIAGNOSIS — I5022 Chronic systolic (congestive) heart failure: Secondary | ICD-10-CM | POA: Diagnosis present

## 2023-04-27 DIAGNOSIS — J189 Pneumonia, unspecified organism: Secondary | ICD-10-CM | POA: Diagnosis present

## 2023-04-27 DIAGNOSIS — Z1152 Encounter for screening for COVID-19: Secondary | ICD-10-CM | POA: Diagnosis not present

## 2023-04-27 DIAGNOSIS — I110 Hypertensive heart disease with heart failure: Secondary | ICD-10-CM | POA: Diagnosis present

## 2023-04-27 DIAGNOSIS — C3491 Malignant neoplasm of unspecified part of right bronchus or lung: Secondary | ICD-10-CM

## 2023-04-27 DIAGNOSIS — I420 Dilated cardiomyopathy: Secondary | ICD-10-CM | POA: Diagnosis present

## 2023-04-27 DIAGNOSIS — Z952 Presence of prosthetic heart valve: Secondary | ICD-10-CM | POA: Diagnosis not present

## 2023-04-27 LAB — ECHOCARDIOGRAM COMPLETE
AR max vel: 1.24 cm2
AV Area VTI: 1.33 cm2
AV Area mean vel: 1.2 cm2
AV Mean grad: 19.4 mm[Hg]
AV Peak grad: 32.9 mm[Hg]
Ao pk vel: 2.87 m/s
Area-P 1/2: 6.67 cm2
Calc EF: 32.7 %
Height: 69 in
P 1/2 time: 170 ms
S' Lateral: 4.6 cm
Single Plane A2C EF: 32 %
Single Plane A4C EF: 30.9 %
Weight: 2320 [oz_av]

## 2023-04-27 LAB — CBC
HCT: 35.5 % — ABNORMAL LOW (ref 39.0–52.0)
Hemoglobin: 11.4 g/dL — ABNORMAL LOW (ref 13.0–17.0)
MCH: 29.9 pg (ref 26.0–34.0)
MCHC: 32.1 g/dL (ref 30.0–36.0)
MCV: 93.2 fL (ref 80.0–100.0)
Platelets: 43 10*3/uL — ABNORMAL LOW (ref 150–400)
RBC: 3.81 MIL/uL — ABNORMAL LOW (ref 4.22–5.81)
RDW: 15.8 % — ABNORMAL HIGH (ref 11.5–15.5)
WBC: 8.7 10*3/uL (ref 4.0–10.5)
nRBC: 0 % (ref 0.0–0.2)

## 2023-04-27 LAB — URINALYSIS, ROUTINE W REFLEX MICROSCOPIC
Bacteria, UA: NONE SEEN
Bilirubin Urine: NEGATIVE
Glucose, UA: >=500 mg/dL — AB
Hgb urine dipstick: NEGATIVE
Ketones, ur: NEGATIVE mg/dL
Leukocytes,Ua: NEGATIVE
Nitrite: NEGATIVE
Protein, ur: NEGATIVE mg/dL
Specific Gravity, Urine: 1.029 (ref 1.005–1.030)
pH: 5 (ref 5.0–8.0)

## 2023-04-27 LAB — STREP PNEUMONIAE URINARY ANTIGEN: Strep Pneumo Urinary Antigen: NEGATIVE

## 2023-04-27 LAB — COMPREHENSIVE METABOLIC PANEL
ALT: 17 U/L (ref 0–44)
AST: 38 U/L (ref 15–41)
Albumin: 2.8 g/dL — ABNORMAL LOW (ref 3.5–5.0)
Alkaline Phosphatase: 84 U/L (ref 38–126)
Anion gap: 11 (ref 5–15)
BUN: 27 mg/dL — ABNORMAL HIGH (ref 8–23)
CO2: 19 mmol/L — ABNORMAL LOW (ref 22–32)
Calcium: 9.2 mg/dL (ref 8.9–10.3)
Chloride: 105 mmol/L (ref 98–111)
Creatinine, Ser: 0.96 mg/dL (ref 0.61–1.24)
GFR, Estimated: >60 mL/min (ref >60)
Glucose, Bld: 205 mg/dL — ABNORMAL HIGH (ref 70–99)
Potassium: 3.9 mmol/L (ref 3.5–5.1)
Sodium: 135 mmol/L (ref 135–145)
Total Bilirubin: 0.9 mg/dL (ref <1.2)
Total Protein: 7.1 g/dL (ref 6.5–8.1)

## 2023-04-27 LAB — CBG MONITORING, ED
Glucose-Capillary: 230 mg/dL — ABNORMAL HIGH (ref 70–99)
Glucose-Capillary: 173 mg/dL — ABNORMAL HIGH (ref 70–99)

## 2023-04-27 LAB — SEDIMENTATION RATE: Sed Rate: 63 mm/h — ABNORMAL HIGH (ref 0–16)

## 2023-04-27 LAB — PROCALCITONIN: Procalcitonin: 0.4 ng/mL

## 2023-04-27 LAB — GLUCOSE, CAPILLARY
Glucose-Capillary: 356 mg/dL — ABNORMAL HIGH (ref 70–99)
Glucose-Capillary: 244 mg/dL — ABNORMAL HIGH (ref 70–99)
Glucose-Capillary: 276 mg/dL — ABNORMAL HIGH (ref 70–99)

## 2023-04-27 LAB — BRAIN NATRIURETIC PEPTIDE: B Natriuretic Peptide: 138.8 pg/mL — ABNORMAL HIGH (ref 0.0–100.0)

## 2023-04-27 MED ORDER — INSULIN ASPART 100 UNIT/ML IJ SOLN
0.0000 [IU] | Freq: Three times a day (TID) | INTRAMUSCULAR | Status: DC
Start: 2023-04-27 — End: 2023-04-29
  Administered 2023-04-27: 5 [IU] via SUBCUTANEOUS
  Administered 2023-04-28 (×2): 8 [IU] via SUBCUTANEOUS
  Administered 2023-04-28 – 2023-04-29 (×2): 15 [IU] via SUBCUTANEOUS

## 2023-04-27 MED ORDER — ENSURE ENLIVE PO LIQD
237.0000 mL | Freq: Two times a day (BID) | ORAL | Status: DC
  Administered 2023-04-27 – 2023-04-28 (×2): 237 mL via ORAL

## 2023-04-27 MED ORDER — ACETAMINOPHEN 325 MG PO TABS: 650.0000 mg | ORAL_TABLET | Freq: Four times a day (QID) | ORAL | Status: DC | PRN

## 2023-04-27 MED ORDER — POTASSIUM CHLORIDE CRYS ER 20 MEQ PO TBCR
40.0000 meq | EXTENDED_RELEASE_TABLET | Freq: Once | ORAL | Status: AC
  Administered 2023-04-27: 40 meq via ORAL
  Filled 2023-04-27: qty 2

## 2023-04-27 MED ORDER — FUROSEMIDE 10 MG/ML IJ SOLN
40.0000 mg | Freq: Two times a day (BID) | INTRAMUSCULAR | Status: DC
  Administered 2023-04-27 – 2023-04-28 (×3): 40 mg via INTRAVENOUS
  Filled 2023-04-27 (×3): qty 4

## 2023-04-27 MED ORDER — INSULIN GLARGINE-YFGN 100 UNIT/ML ~~LOC~~ SOLN
14.0000 [IU] | Freq: Every day | SUBCUTANEOUS | Status: DC
  Administered 2023-04-27 – 2023-04-28 (×2): 14 [IU] via SUBCUTANEOUS
  Filled 2023-04-27 (×3): qty 0.14

## 2023-04-27 MED ORDER — METOPROLOL TARTRATE 25 MG PO TABS
25.0000 mg | ORAL_TABLET | Freq: Four times a day (QID) | ORAL | Status: DC
  Administered 2023-04-27 – 2023-04-28 (×6): 25 mg via ORAL
  Filled 2023-04-27 (×7): qty 1

## 2023-04-27 MED ORDER — INSULIN ASPART 100 UNIT/ML IJ SOLN
0.0000 [IU] | Freq: Every day | INTRAMUSCULAR | Status: DC
Start: 2023-04-27 — End: 2023-04-29
  Administered 2023-04-27 – 2023-04-28 (×2): 3 [IU] via SUBCUTANEOUS

## 2023-04-27 MED ORDER — PERFLUTREN LIPID MICROSPHERE
1.0000 mL | INTRAVENOUS | Status: DC | PRN
Start: 2023-04-27 — End: 2023-04-27
  Administered 2023-04-27: 3 mL via INTRAVENOUS

## 2023-04-27 MED ORDER — ALUM & MAG HYDROXIDE-SIMETH 200-200-20 MG/5ML PO SUSP
30.0000 mL | Freq: Once | ORAL | Status: AC
  Administered 2023-04-27: 30 mL via ORAL
  Filled 2023-04-27: qty 30

## 2023-04-27 NOTE — Progress Notes (Signed)
Notified McClung MD of patient's status and condition. Will be putting patient on BIPAP ro assist with his respiratory failure.  Will continue to monitor patient

## 2023-04-27 NOTE — Progress Notes (Signed)
[facility] CSW Progress Note  Clinical Child psychotherapist  received a referral to contact pt's daughter regarding additional support in the home.  Pt is currently admitted w/ disposition undetermined at this time.  CSW discussed possible supportive services upon discharge and suggested pt speak w/ Karen Kays from [facility] to assist w/ long term planning.  Pt's daughter verbalized agreement w/ speaking to [place].  CSW contacted Whitney and provided her w/ daughter's contact information.  Whitney to reach out to pt's daughter today.  CSW to remain available as appropriate throughout duration of treatment.        Rachel Moulds, LCSW Clinical Social Worker [facility]

## 2023-04-27 NOTE — Progress Notes (Addendum)
Vincent Velez  QJJ:[id number] DOB: 1940/07/05 DOA: 04/26/2023 PCP: [name], MD    Brief Narrative:  82 year old with a history of HTN, HLD, DM2, GERD, PAD, COPD, PAF, glaucoma, AAA, AOS status post TAVR, and lung cancer currently on immunotherapy who presented to the ER 11/25 with a 3-week history of steadily worsening shortness of breath with the acute onset of palpitations and blood-tinged sputum 3 days prior.  He described his hemoptysis as blood-tinged mucus but no clots.  He is currently on Tagrisso for his recurrent lung cancer.  In the ER he was found to have a heart rate of 120-130.  Respiratory panel was negative for COVID and RSV.  CXR noted extensive progressive reticulation and interstitial prominence consistent with progressive ILD.  CTa chest was negative for PE but showed reticular airspace disease consistent with pneumonia versus ILD with increasing lymphadenopathy.  Goals of Care:   Code Status: Limited: Do not attempt resuscitation (DNR) -DNR-LIMITED -Do Not Intubate/DNI    DVT prophylaxis: SCDs Start: 04/26/23 1833  Interim Hx: Heart rate is now well-controlled.  Patient is afebrile.  He continues to require 7 L high flow nasal cannula oxygen support to keep saturations at 93%.  He is resting in bed at time of visit and tells me he is feeling better.  He is not yet back to his baseline though.  He denies chest pain nausea vomiting or abdominal pain.  Assessment & Plan:  Acute hypoxic respiratory failure -pneumonia versus progressive ILD versus drug-induced pneumonitis Evaluated by Pulmonary -patient confirms DNR/DNI status -required 2 L nasal cannula oxygen support initially but during course of admission requirement increased rapidly up to 8 L -continue empiric antibiotic therapy as well as steroids - diurese as able not for clear volume overload but in hopes it will improve his pulm fxn somewhat - TTE pending   Chronic atrial fibrillation with acute RVR Amiodarone  infusion initiated in the ER then later stopped by cardiology out of concern since the patient would not be able to continue anticoagulation -metoprolol tartrate being favored for rate control  Recurrent lung cancer Initially diagnosed 2020 status post resection -recurrence 2022 status post palliative chemotherapy followed by Keytruda then Tarceva and currently on Tagrisso -Tagrisso on hold due to concern for possible pneumonitis  Thrombocytopenia Appears to be a chronic issue with platelet count 38-46 over the last month -felt to be related to his Tagrisso and was being monitored by his oncologist  DM2 CBG reasonably controlled - monitor w/ use of steroid as further titration of insulin likely to be required   PAD Continue simvastatin -holding Eliquis due to low-grade hemoptysis and thrombocytopenia  HTN Blood pressures currently controlled  HLD Continue usual home medical therapy  AoS status post TAVR No evidence of valve dysfunction at present  AAA  Family Communication: Spoke with family ember at bedside Disposition: Hopeful for discharge home once oxygen saturations stabilized/improved   Objective: Blood pressure 106/74, pulse 95, temperature 97.6 F (36.4 C), temperature source Oral, resp. rate (!) 24, height 5\' 9"  (1.753 m), weight 65.8 kg, SpO2 93%.  Intake/Output Summary (Last 24 hours) at 04/27/2023 0740 Last data filed at 04/27/2023 0134 Gross per 24 hour  Intake 547.12 ml  Output --  Net 547.12 ml   Filed Weights   04/26/23 1246  Weight: 65.8 kg    Examination: General: In mild distress but tolerating it well and appears comfortable Lungs: Faint diffuse crackles with no wheezing Cardiovascular: Regular rate and rhythm without murmur  gallop or rub normal S1 and S2 Abdomen: Nontender, nondistended, soft, bowel sounds positive, no rebound, no ascites, no appreciable mass Extremities: Trace edema bilateral lower extremities  CBC: Recent Labs  Lab  04/26/23 1255 04/27/23 0247  WBC 11.8*  12.1* 8.7  NEUTROABS 10.1*  --   HGB 12.0*  11.8* 11.4*  HCT 36.9*  36.3* 35.5*  MCV 94.4  93.1 93.2  PLT 41*  42* 43*   Basic Metabolic Panel: Recent Labs  Lab 04/26/23 1255 04/26/23 1402 04/27/23 0247  NA 132*  --  135  K 3.8  --  3.9  CL 102  --  105  CO2 20*  --  19*  GLUCOSE 130*  --  205*  BUN 24*  --  27*  CREATININE 0.92  --  0.96  CALCIUM 9.2  --  9.2  MG  --  2.1  --    GFR: Estimated Creatinine Clearance: 57.1 mL/min (by C-G formula based on SCr of 0.96 mg/dL).   Scheduled Meds:  doxycycline  100 mg Oral Q12H   insulin aspart  0-9 Units Subcutaneous TID WC   methylPREDNISolone (SOLU-MEDROL) injection  40 mg Intravenous Q12H   metoprolol tartrate  25 mg Oral Q6H   simvastatin  40 mg Oral QHS   sodium chloride flush  3 mL Intravenous Q12H   tranexamic acid  500 mg Nebulization Q8H   Continuous Infusions:  cefTRIAXone (ROCEPHIN)  IV Stopped (04/26/23 1829)     LOS: 0 days   Lonia Blood, MD [facility] Office  [phone] Pager - Text Page per Loretha Stapler  If 7PM-7AM, please contact night-coverage per Amion 04/27/2023, 7:40 AM

## 2023-04-27 NOTE — Progress Notes (Addendum)
Patient refused his BIPAP and nowis 15L

## 2023-04-27 NOTE — Consult Note (Signed)
Palliative Care Consult Note                                  Date: 04/27/2023   Patient Name: Vincent Velez  DOB: 08/01/1940  MRN: [id number]  Age / Sex: 82 y.o., male  PCP: [name], MD Referring Physician: Lonia Blood, MD  Reason for Consultation: Establishing goals of care  HPI/Patient Profile: 82 y.o. male  with past medical history of lung cancer (s/p wedge resection and chemotherapy,  currently on immunotherapy), aortic stenosis s/p TAVR, paroxysmal atrial fibrillation, COPD, DM 2, GERD, PAD, HTN, and HLD.  He presented to the ED on 04/26/2023 with 3-week history of worsening shortness of breath as well as acute onset of palpitations and blood-tinged sputum.  CTA chest was negative for PE but showed reticular airspace disease consistent with pneumonia versus ILD with increasing lymphadenopathy. 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  Palliative Medicine has been consulted for goals of care and medical decision making.  Past Medical History:  Diagnosis Date   AAA (abdominal aortic aneurysm) (HCC)    Anxiety    Arthritis    Asthma    when younger   Atrial fibrillation (HCC)    CAD (coronary artery disease)    Cataract    removed both   COPD (chronic obstructive pulmonary disease) (HCC)    Diabetes mellitus    Type II   Dysrhythmia    afib   Esophageal reflux    Family history of malignant neoplasm of gastrointestinal tract    Heart murmur    Hiatal hernia    Hyperlipemia    Hypertension    Lung nodule    a. PET scan highly suspicious for malignancy. Bronch with biopsy to be done after TAVR   Malignant neoplasm of prostate [facility])    prostate    Peripheral vascular disease (HCC)    Pneumonia    Stricture and stenosis of esophagus     Subjective:   I have reviewed medical records including EPIC notes,  labs and imaging, received report from the team, and assessed the patient at bedside.   I met with *** to discuss diagnosis, prognosis, GOC, EOL wishes, disposition, and options.  I introduced Palliative Medicine as specialized medical care for people living with serious illness. It focuses on providing relief from the symptoms and stress of a serious illness.   We discussed patient's current illness and what it means in the larger context of his/her ongoing co-morbidities. Current clinical status was reviewed. Natural disease trajectory of *** was discussed.  Created space and opportunity for patient and family to explore thoughts and feelings regarding current medical situation.  Values and goals of care important to patient and family were attempted to be elicited.  A discussion was had today regarding advanced directives. Concepts specific to code status, artifical feeding and hydration, continued IV antibiotics and rehospitalization was had.  The MOST form was introduced and discussed.  Questions and concerns addressed. Patient/family encouraged to call with questions or concerns.     Life Review: ***  Functional Status: ***  Patient/Family Understanding of Illness: ***  Patient Values: ***  Goals: ***  Additional Discussion: ***  Review of Systems  Objective:   Primary Diagnoses: Present on Admission:  Hypertension  Hyperlipemia  GERD  Anxiety disorder  Glaucoma  Abdominal aortic aneurysm  without rupture (HCC)  Aortic valve disorder  Adenocarcinoma, lung (HCC)  Thrombocytopenia (HCC)  COPD (chronic obstructive pulmonary disease) (HCC)  Peripheral vascular disease (HCC)  Persistent atrial fibrillation  Acute respiratory failure with hypoxia (HCC)  Acute hypoxic respiratory failure (HCC)   Physical Exam  Vital Signs:  BP 107/79 (BP Location: Right Arm)   Pulse 99   Temp 97.6 F (36.4 C) (Axillary)   Resp (!) 26   Ht 5\' 9"  (1.753 m)   Wt 65.8 kg    SpO2 93%   BMI 21.41 kg/m   Palliative Assessment/Data: ***     Assessment & Plan:   SUMMARY OF RECOMMENDATIONS   ***  Primary Decision Maker: {Primary Decision MVHQI:69629}  Code Status/Advance Care Planning: {Palliative Code status:23503}  Symptom Management:  ***  Prognosis:  {Palliative Care Prognosis:23504}  Discharge Planning:  {Palliative dispostion:23505}   Discussed with: ***    Thank you for allowing [place] to participate in the care of DERBY WITTMAN   Time Total: ***  Greater than 50%  of this time was spent counseling and coordinating care related to the above assessment and plan.  Signed by: Sherlean Foot, NP Palliative Medicine Team  Team Phone # [phone]  For individual providers, please see [facility]ION

## 2023-04-27 NOTE — ED Notes (Signed)
Pt tachycardic, multiple runs of PVCs, admitting service paged awaiting response

## 2023-04-27 NOTE — Plan of Care (Signed)
Patient has been assessed and MD informed of patient's condition. The patient has refused to wear his BIPAP, place on 15L of oxygen. Family is at the bedside. Pallative consult pending. Patient states he doesn't want to wear the BIPAP it feels too much like a ventilator. Will continue to monitor patient.

## 2023-04-27 NOTE — ED Notes (Signed)
ED TO INPATIENT HANDOFF REPORT  ED Nurse Name and Phone #: Andrei Mccook [phone]  S Name/Age/Gender Vincent Velez 82 y.o. male Room/Bed: 038C/038C  Code Status   Code Status: Limited: Do not attempt resuscitation (DNR) -DNR-LIMITED -Do Not Intubate/DNI   Home/SNF/Other Home Patient oriented to: self, place, time, and situation Is this baseline? Yes   Triage Complete: Triage complete  Chief Complaint Acute respiratory failure with hypoxia (HCC) [J96.01]  Triage Note Patient arrived from home with increased hemoptysis and feeling like heart racing with any exertion and increased exertional sob. Patient reports that he has lung cancer and currently taking po chemo pills for same. Hx of atrial fib but normally rate controlled. No fever, no chills. Alert and oriented. Sats 84 and placed on oxygen at 3l. Denies pain. Complains of increased weakness in the mornings   Allergies Allergies  Allergen Reactions   Macrolides And Ketolides Other (See Comments)    Pt unsure of this     Level of Care/Admitting Diagnosis ED Disposition     ED Disposition  Admit   Condition  --   Comment  Hospital Area: [facility] [[id number]]  Level of Care: Progressive [102]  Admit to Progressive based on following criteria: CARDIOVASCULAR & THORACIC of moderate stability with acute coronary syndrome symptoms/low risk myocardial infarction/hypertensive urgency/arrhythmias/heart failure potentially compromising stability and stable post cardiovascular intervention patients.  Admit to Progressive based on following criteria: RESPIRATORY PROBLEMS hypoxemic/hypercapnic respiratory failure that is responsive to NIPPV (BiPAP) or High Flow Nasal Cannula (6-80 lpm). Frequent assessment/intervention, no > Q2 hrs < Q4 hrs, to maintain oxygenation and pulmonary hygiene.  May place patient in observation at [facility] or [facility] if equivalent level of care is available:: No  Covid Evaluation: Asymptomatic  - no recent exposure (last 10 days) testing not required  Diagnosis: Acute respiratory failure with hypoxia [facility]) [[id number]]  Admitting Physician: Synetta Fail [[id number]]  Attending Physician: Synetta Fail [phone]          B Medical/Surgery History Past Medical History:  Diagnosis Date   AAA (abdominal aortic aneurysm) (HCC)    Anxiety    Arthritis    Asthma    when younger   Atrial fibrillation (HCC)    CAD (coronary artery disease)    Cataract    removed both   COPD (chronic obstructive pulmonary disease) (HCC)    Diabetes mellitus    Type II   Dysrhythmia    afib   Esophageal reflux    Family history of malignant neoplasm of gastrointestinal tract    Heart murmur    Hiatal hernia    Hyperlipemia    Hypertension    Lung nodule    a. PET scan highly suspicious for malignancy. Bronch with biopsy to be done after TAVR   Malignant neoplasm of prostate (HCC)    prostate    Peripheral vascular disease (HCC)    Pneumonia    Stricture and stenosis of esophagus    Past Surgical History:  Procedure Laterality Date   ABDOMINAL AORTA STENT     CARDIAC CATHETERIZATION     CARDIOVERSION N/A 12/04/2015   Procedure: CARDIOVERSION;  Surgeon: Thurmon Fair, MD;  Location: [facility] ENDOSCOPY;  Service: Cardiovascular;  Laterality: N/A;   COLONOSCOPY     FINGER SURGERY Right    middle finger- amputation   INSERTION PROSTATE RADIATION SEED     MEDIASTINOSCOPY N/A 01/06/2019   Procedure: MEDIASTINOSCOPY WITH BIOPIES;  Surgeon: Alleen Borne, MD;  Location: [facility] OR;  Service: Thoracic;  Laterality: N/A;   RIGHT/LEFT HEART CATH AND CORONARY ANGIOGRAPHY N/A 10/26/2018   Procedure: RIGHT/LEFT HEART CATH AND CORONARY ANGIOGRAPHY;  Surgeon: Kathleene Hazel, MD;  Location: [facility] INVASIVE CV LAB;  Service: Cardiovascular;  Laterality: N/A;   TEE WITHOUT CARDIOVERSION N/A 12/06/2018   Procedure: TRANSESOPHAGEAL ECHOCARDIOGRAM (TEE);  Surgeon: Kathleene Hazel, MD;  Location:  [facility] INVASIVE CV LAB;  Service: Open Heart Surgery;  Laterality: N/A;   TRANSCATHETER AORTIC VALVE REPLACEMENT, TRANSFEMORAL N/A 12/06/2018   Procedure: TRANSCATHETER AORTIC VALVE REPLACEMENT, TRANSFEMORAL;  Surgeon: Kathleene Hazel, MD;  Location: [facility] INVASIVE CV LAB;  Service: Open Heart Surgery;  Laterality: N/A;   UPPER GASTROINTESTINAL ENDOSCOPY     VIDEO ASSISTED THORACOSCOPY (VATS)/THOROCOTOMY Left 02/02/2019   Procedure: VIDEO ASSISTED THORACOSCOPY (VATS)/THOROCOTOMY;  Surgeon: Alleen Borne, MD;  Location: [facility] OR;  Service: Thoracic;  Laterality: Left;   VIDEO BRONCHOSCOPY N/A 01/06/2019   Procedure: VIDEO BRONCHOSCOPY;  Surgeon: Alleen Borne, MD;  Location: [facility] OR;  Service: Thoracic;  Laterality: N/A;   VIDEO BRONCHOSCOPY WITH ENDOBRONCHIAL ULTRASOUND N/A 03/07/2021   Procedure: VIDEO BRONCHOSCOPY WITH ENDOBRONCHIAL ULTRASOUND;  Surgeon: Loreli Slot, MD;  Location: [facility] OR;  Service: Thoracic;  Laterality: N/A;   WEDGE RESECTION Left 02/02/2019   Procedure: LUNG RESECTION;  Surgeon: Alleen Borne, MD;  Location: [facility] OR;  Service: Thoracic;  Laterality: Left;     A IV Location/Drains/Wounds Patient Lines/Drains/Airways Status     Active Line/Drains/Airways     Name Placement date Placement time Site Days   Peripheral IV 04/26/23 20 G Left Antecubital 04/26/23  1254  Antecubital  1   Peripheral IV 04/26/23 20 G Anterior;Distal;Right;Upper Arm 04/26/23  1312  Arm  1   External Urinary Catheter 04/27/23  0419  --  less than 1            Intake/Output Last 24 hours  Intake/Output Summary (Last 24 hours) at 04/27/2023 0852 Last data filed at 04/27/2023 0134 Gross per 24 hour  Intake 547.12 ml  Output --  Net 547.12 ml    Labs/Imaging Results for orders placed or performed during the hospital encounter of 04/26/23 (from the past 48 hour(s))  CBC     Status: Abnormal   Collection Time: 04/26/23 12:55 PM  Result Value Ref Range   WBC 12.1 (H) 4.0 - 10.5 K/uL    RBC 3.90 (L) 4.22 - 5.81 MIL/uL   Hemoglobin 11.8 (L) 13.0 - 17.0 g/dL   HCT 11.9 (L) 14.7 - 82.9 %   MCV 93.1 80.0 - 100.0 fL   MCH 30.3 26.0 - 34.0 pg   MCHC 32.5 30.0 - 36.0 g/dL   RDW 56.2 (H) 13.0 - 86.5 %   Platelets 42 (L) 150 - 400 K/uL    Comment: Immature Platelet Fraction may be clinically indicated, consider ordering this additional test [id number] CONSISTENT WITH PREVIOUS RESULT REPEATED TO VERIFY    nRBC 0.0 0.0 - 0.2 %    Comment: Performed at [facility] Lab, [address], [place], [place] [address]  Troponin I (High Sensitivity)     Status: Abnormal   Collection Time: 04/26/23 12:55 PM  Result Value Ref Range   Troponin I (High Sensitivity) 36 (H) <18 ng/L    Comment: (NOTE) Elevated high sensitivity troponin I (hsTnI) values and significant  changes across serial measurements may suggest ACS but many other  chronic and acute conditions are known to elevate hsTnI results.  Refer to the "Links" section for chest pain algorithms and additional  guidance. Performed at [facility] Lab, [address], [place], [place] [address]   Protime-INR (order if Patient is taking Coumadin / Warfarin)     Status: Abnormal   Collection Time: 04/26/23 12:55 PM  Result Value Ref Range   Prothrombin Time 22.7 (H) 11.4 - 15.2 seconds   INR 2.0 (H) 0.8 - 1.2    Comment: (NOTE) INR goal varies based on device and disease states. Performed at [facility] Lab, [address] [place], [place] [address]   Comprehensive metabolic panel     Status: Abnormal   Collection Time: 04/26/23 12:55 PM  Result Value Ref Range   Sodium 132 (L) 135 - 145 mmol/L   Potassium 3.8 3.5 - 5.1 mmol/L   Chloride 102 98 - 111 mmol/L   CO2 20 (L) 22 - 32 mmol/L   Glucose, Bld 130 (H) 70 - 99 mg/dL    Comment: Glucose reference range applies only to samples taken after fasting for at least 8 hours.   BUN 24 (H) 8 - 23 mg/dL   Creatinine, Ser 4.78 0.61 - 1.24 mg/dL   Calcium 9.2 8.9 - 29.5  mg/dL   Total Protein 7.4 6.5 - 8.1 g/dL   Albumin 3.1 (L) 3.5 - 5.0 g/dL   AST 37 15 - 41 U/L   ALT 17 0 - 44 U/L   Alkaline Phosphatase 84 38 - 126 U/L   Total Bilirubin 1.5 (H) <1.2 mg/dL   GFR, Estimated >62 >13 mL/min    Comment: (NOTE) Calculated using the CKD-EPI Creatinine Equation (2021)    Anion gap 10 5 - 15    Comment: Performed at [facility] Lab, [address], [place], [place] [address]  CBC with Differential/Platelet     Status: Abnormal   Collection Time: 04/26/23 12:55 PM  Result Value Ref Range   WBC 11.8 (H) 4.0 - 10.5 K/uL   RBC 3.91 (L) 4.22 - 5.81 MIL/uL   Hemoglobin 12.0 (L) 13.0 - 17.0 g/dL   HCT 84.6 (L) 96.2 - 95.2 %   MCV 94.4 80.0 - 100.0 fL   MCH 30.7 26.0 - 34.0 pg   MCHC 32.5 30.0 - 36.0 g/dL   RDW 84.1 (H) 32.4 - 40.1 %   Platelets 41 (L) 150 - 400 K/uL    Comment: Immature Platelet Fraction may be clinically indicated, consider ordering this additional test [id number] REPEATED TO VERIFY PLATELET COUNT CONFIRMED BY SMEAR    nRBC 0.0 0.0 - 0.2 %   Neutrophils Relative % 85 %   Neutro Abs 10.1 (H) 1.7 - 7.7 K/uL   Lymphocytes Relative 6 %   Lymphs Abs 0.7 0.7 - 4.0 K/uL   Monocytes Relative 8 %   Monocytes Absolute 0.9 0.1 - 1.0 K/uL   Eosinophils Relative 0 %   Eosinophils Absolute 0.0 0.0 - 0.5 K/uL   Basophils Relative 0 %   Basophils Absolute 0.0 0.0 - 0.1 K/uL   Immature Granulocytes 1 %   Abs Immature Granulocytes 0.06 0.00 - 0.07 K/uL    Comment: Performed at [facility] Lab, [address] [place], [place] [address]  I-Stat Lactic Acid, ED     Status: None   Collection Time: 04/26/23  1:10 PM  Result Value Ref Range   Lactic Acid, Venous 1.0 0.5 - 1.9 mmol/L  Type and screen [facility]     Status:  None   Collection Time: 04/26/23  1:31 PM  Result Value Ref Range   ABO/RH(D) O POS    Antibody Screen NEG    Sample Expiration      04/05/2023,2359 Performed at [facility], [address],  [place], [place] [address]   Brain natriuretic peptide     Status: Abnormal   Collection Time: 04/26/23  2:02 PM  Result Value Ref Range   B Natriuretic Peptide 158.0 (H) 0.0 - 100.0 pg/mL    Comment: Performed at [facility] Lab, [address], [place], [place] [address]  Resp panel by RT-PCR (RSV, Flu A&B, Covid) Anterior Nasal Swab     Status: None   Collection Time: 04/26/23  2:02 PM   Specimen: Anterior Nasal Swab  Result Value Ref Range   SARS Coronavirus 2 by RT PCR NEGATIVE NEGATIVE   Influenza A by PCR NEGATIVE NEGATIVE   Influenza B by PCR NEGATIVE NEGATIVE    Comment: (NOTE) The Xpert Xpress SARS-CoV-2/FLU/RSV plus assay is intended as an aid in the diagnosis of influenza from Nasopharyngeal swab specimens and should not be used as a sole basis for treatment. Nasal washings and aspirates are unacceptable for Xpert Xpress SARS-CoV-2/FLU/RSV testing.  Fact Sheet for Patients: BloggerCourse.com  Fact Sheet for Healthcare Providers: SeriousBroker.it  This test is not yet approved or cleared by the [place] FDA and has been authorized for detection and/or diagnosis of SARS-CoV-2 by FDA under an Emergency Use Authorization (EUA). This EUA will remain in effect (meaning this test can be used) for the duration of the COVID-19 declaration under Section 564(b)(1) of the Act, 21 U.S.C. section 360bbb-3(b)(1), unless the authorization is terminated or revoked.     Resp Syncytial Virus by PCR NEGATIVE NEGATIVE    Comment: (NOTE) Fact Sheet for Patients: BloggerCourse.com  Fact Sheet for Healthcare Providers: SeriousBroker.it  This test is not yet approved or cleared by the [place] FDA and has been authorized for detection and/or diagnosis of SARS-CoV-2 by FDA under an Emergency Use Authorization (EUA). This EUA will remain in effect (meaning this test can be used)  for the duration of the COVID-19 declaration under Section 564(b)(1) of the Act, 21 U.S.C. section 360bbb-3(b)(1), unless the authorization is terminated or revoked.  Performed at [facility] Lab, [address], [place], [place] [address]   Troponin I (High Sensitivity)     Status: Abnormal   Collection Time: 04/26/23  2:02 PM  Result Value Ref Range   Troponin I (High Sensitivity) 37 (H) <18 ng/L    Comment: (NOTE) Elevated high sensitivity troponin I (hsTnI) values and significant  changes across serial measurements may suggest ACS but many other  chronic and acute conditions are known to elevate hsTnI results.  Refer to the "Links" section for chest pain algorithms and additional  guidance. Performed at [facility] Lab, [address] [place], [place] [address]   Magnesium     Status: None   Collection Time: 04/26/23  2:02 PM  Result Value Ref Range   Magnesium 2.1 1.7 - 2.4 mg/dL    Comment: Performed at [facility], [address], [place], [place] [address]  I-Stat Lactic Acid, ED     Status: None   Collection Time: 04/26/23  3:09 PM  Result Value Ref Range   Lactic Acid, Venous 1.0 0.5 - 1.9 mmol/L  CBG monitoring, ED     Status: Abnormal   Collection Time: 04/27/23  2:37  AM  Result Value Ref Range   Glucose-Capillary 230 (H) 70 - 99 mg/dL    Comment: Glucose reference range applies only to samples taken after fasting for at least 8 hours.  Sedimentation rate     Status: Abnormal   Collection Time: 04/27/23  2:47 AM  Result Value Ref Range   Sed Rate 63 (H) 0 - 16 mm/hr    Comment: Performed at [facility] Lab, [address], [place], [place] [address]  Brain natriuretic peptide     Status: Abnormal   Collection Time: 04/27/23  2:47 AM  Result Value Ref Range   B Natriuretic Peptide 138.8 (H) 0.0 - 100.0 pg/mL    Comment: Performed at [facility] Lab, [address], [place], [place] [address]  Procalcitonin     Status: None   Collection  Time: 04/27/23  2:47 AM  Result Value Ref Range   Procalcitonin 0.40 ng/mL    Comment:        Interpretation: PCT (Procalcitonin) <= 0.5 ng/mL: Systemic infection (sepsis) is not likely. Local bacterial infection is possible. (NOTE)       Sepsis PCT Algorithm           Lower Respiratory Tract                                      Infection PCT Algorithm    ----------------------------     ----------------------------         PCT < 0.25 ng/mL                PCT < 0.10 ng/mL          Strongly encourage             Strongly discourage   discontinuation of antibiotics    initiation of antibiotics    ----------------------------     -----------------------------       PCT 0.25 - 0.50 ng/mL            PCT 0.10 - 0.25 ng/mL               OR       >80% decrease in PCT            Discourage initiation of                                            antibiotics      Encourage discontinuation           of antibiotics    ----------------------------     -----------------------------         PCT >= 0.50 ng/mL              PCT 0.26 - 0.50 ng/mL               AND        <80% decrease in PCT             Encourage initiation of                                             antibiotics       Encourage continuation  of antibiotics    ----------------------------     -----------------------------        PCT >= 0.50 ng/mL                  PCT > 0.50 ng/mL               AND         increase in PCT                  Strongly encourage                                      initiation of antibiotics    Strongly encourage escalation           of antibiotics                                     -----------------------------                                           PCT <= 0.25 ng/mL                                                 OR                                        > 80% decrease in PCT                                      Discontinue / Do not initiate                                              antibiotics  Performed at [facility] Lab, [address] [place], [place] [address]   Comprehensive metabolic panel     Status: Abnormal   Collection Time: 04/27/23  2:47 AM  Result Value Ref Range   Sodium 135 135 - 145 mmol/L   Potassium 3.9 3.5 - 5.1 mmol/L   Chloride 105 98 - 111 mmol/L   CO2 19 (L) 22 - 32 mmol/L   Glucose, Bld 205 (H) 70 - 99 mg/dL    Comment: Glucose reference range applies only to samples taken after fasting for at least 8 hours.   BUN 27 (H) 8 - 23 mg/dL   Creatinine, Ser 1.91 0.61 - 1.24 mg/dL   Calcium 9.2 8.9 - 47.8 mg/dL   Total Protein 7.1 6.5 - 8.1 g/dL   Albumin 2.8 (L) 3.5 - 5.0 g/dL   AST 38 15 - 41 U/L   ALT 17 0 - 44 U/L   Alkaline Phosphatase 84 38 - 126 U/L   Total Bilirubin 0.9 <1.2 mg/dL   GFR, Estimated >29 >56 mL/min    Comment: (NOTE) Calculated using  the CKD-EPI Creatinine Equation (2021)    Anion gap 11 5 - 15    Comment: Performed at [facility] Lab, [address] [place], [place] [address]  CBC     Status: Abnormal   Collection Time: 04/27/23  2:47 AM  Result Value Ref Range   WBC 8.7 4.0 - 10.5 K/uL   RBC 3.81 (L) 4.22 - 5.81 MIL/uL   Hemoglobin 11.4 (L) 13.0 - 17.0 g/dL   HCT 29.5 (L) 62.1 - 30.8 %   MCV 93.2 80.0 - 100.0 fL   MCH 29.9 26.0 - 34.0 pg   MCHC 32.1 30.0 - 36.0 g/dL   RDW 65.7 (H) 84.6 - 96.2 %   Platelets 43 (L) 150 - 400 K/uL    Comment: Immature Platelet Fraction may be clinically indicated, consider ordering this additional test [id number] REPEATED TO VERIFY    nRBC 0.0 0.0 - 0.2 %    Comment: Performed at [facility] Lab, [address], [place], [place] [address]  Urinalysis, Routine w reflex microscopic -Urine, Clean Catch     Status: Abnormal   Collection Time: 04/27/23  3:52 AM  Result Value Ref Range   Color, Urine YELLOW YELLOW   APPearance CLEAR CLEAR   Specific Gravity, Urine 1.029 1.005 - 1.030   pH 5.0 5.0 - 8.0   Glucose, UA >=500 (A) NEGATIVE mg/dL   Hgb urine dipstick  NEGATIVE NEGATIVE   Bilirubin Urine NEGATIVE NEGATIVE   Ketones, ur NEGATIVE NEGATIVE mg/dL   Protein, ur NEGATIVE NEGATIVE mg/dL   Nitrite NEGATIVE NEGATIVE   Leukocytes,Ua NEGATIVE NEGATIVE   RBC / HPF 0-5 0 - 5 RBC/hpf   WBC, UA 0-5 0 - 5 WBC/hpf   Bacteria, UA NONE SEEN NONE SEEN   Squamous Epithelial / HPF 0-5 0 - 5 /HPF   Mucus PRESENT     Comment: Performed at [facility] Lab, [address], [place], [place] [address]  Strep pneumoniae urinary antigen     Status: None   Collection Time: 04/27/23  3:52 AM  Result Value Ref Range   Strep Pneumo Urinary Antigen NEGATIVE NEGATIVE    Comment:        Infection due to S. pneumoniae cannot be absolutely ruled out since the antigen present may be below the detection limit of the test. Performed at [facility] Lab, [address] [place], [place] [address]   CBG monitoring, ED     Status: Abnormal   Collection Time: 04/27/23  6:50 AM  Result Value Ref Range   Glucose-Capillary 173 (H) 70 - 99 mg/dL    Comment: Glucose reference range applies only to samples taken after fasting for at least 8 hours.   CT Angio Chest PE W and/or Wo Contrast  Result Date: 04/26/2023 CLINICAL DATA:  Hemoptysis.  History of lung cancer. EXAM: CT ANGIOGRAPHY CHEST WITH CONTRAST TECHNIQUE: Multidetector CT imaging of the chest was performed using the standard protocol during bolus administration of intravenous contrast. Multiplanar CT image reconstructions and MIPs were obtained to evaluate the vascular anatomy. RADIATION DOSE REDUCTION: This exam was performed according to the departmental dose-optimization program which includes automated exposure control, adjustment of the mA and/or kV according to patient size and/or use of iterative reconstruction technique. CONTRAST:  75mL OMNIPAQUE IOHEXOL 350 MG/ML SOLN COMPARISON:  February 26, 2023.  January 20, 2023. FINDINGS: Cardiovascular: Satisfactory opacification of the pulmonary arteries to the  segmental level. No evidence of pulmonary embolism. Mild cardiomegaly. Status  post transcatheter aortic valve repair. Coronary artery calcifications are noted. No pericardial effusion. Mediastinum/Nodes: Esophagus is unremarkable. Thyroid gland is unremarkable. 17 mm right hilar lymph node is noted concerning for malignancy. 16 mm pretracheal lymph node is noted concerning for malignancy. 13 mm subcarinal lymph node is noted consistent with malignancy. Lungs/Pleura: No pneumothorax is noted. Interval development of patchy airspace opacities throughout both lungs most consistent with multifocal pneumonia. Minimal left pleural effusion is noted. Upper Abdomen: No acute abnormality. Musculoskeletal: No chest wall abnormality. No acute or significant osseous findings. Review of the MIP images confirms the above findings. IMPRESSION: No definite evidence of pulmonary embolus. Interval development of diffuse patchy airspace opacities throughout both lungs most consistent with multifocal pneumonia. Minimal left pleural effusion. Coronary artery calcifications are noted suggesting coronary artery disease. Mildly enlarged mediastinal lymph nodes are noted concerning for metastatic disease given the history of lung cancer. Aortic Atherosclerosis (ICD10-I70.0). Electronically Signed   By: Lupita Raider M.D.   On: 04/26/2023 16:44   DG Chest Portable 1 View  Result Date: 04/26/2023 CLINICAL DATA:  Hemoptysis. History of lung cancer and atrial fibrillation. EXAM: PORTABLE CHEST 1 VIEW COMPARISON:  CT 01/20/2023. PET-CT 02/26/2023. Radiographs 03/30/2021 and 03/05/2021. FINDINGS: 1327 hours. Low lung volumes. The heart size and mediastinal contours are grossly stable status post TAVR procedure. There is extensive subpleural reticulation and interstitial prominence in both lungs which has significantly progressed from the 2022 radiographs. Pulmonary findings also appear progressive compared with the chest CT of 3 months ago  but similar to the more recent PET-CT of 2 months ago. No confluent airspace disease, large pleural effusion or pneumothorax identified. The bones appear unchanged. Telemetry leads overlie the chest. IMPRESSION: Extensive subpleural reticulation and interstitial prominence in both lungs, progressive compared with the chest CT of 3 months ago. 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 Differential includes atypical infection and lymphangitic tumor based on prior cross-sectional imaging. Continued follow-up recommended. Electronically Signed   By: Carey Bullocks M.D.   On: 04/26/2023 14:19    Pending Labs Unresulted Labs (From admission, onward)     Start     Ordered   04/28/23 0500  Comprehensive metabolic panel  Tomorrow morning,   R        04/27/23 0808   04/28/23 0500  CBC  Tomorrow morning,   R        04/27/23 0808   04/28/23 0500  Magnesium  Tomorrow morning,   R        04/27/23 0808   04/26/23 1516  Legionella Pneumophila Serogp 1 Ur Ag  Once,   URGENT        04/26/23 1521   04/26/23 1331  Blood culture (routine x 2)  BLOOD CULTURE X 2,   R (with STAT occurrences)      04/26/23 1332            Vitals/Pain Today's Vitals   04/27/23 0400 04/27/23 0415 04/27/23 0652 04/27/23 0722  BP: (!) 118/92   106/74  Pulse: (!) 113 (!) 117  95  Resp: (!) 28 (!) 25  (!) 24  Temp:    97.6 F (36.4 C)  TempSrc:    Oral  SpO2: 96% 94% 94% 93%  Weight:      Height:      PainSc:        Isolation Precautions No active isolations  Medications Medications  amiodarone (NEXTERONE) 1.8 mg/mL load via  infusion 150 mg (150 mg Intravenous Bolus from Bag 04/26/23 1543)    Followed by  amiodarone (NEXTERONE PREMIX) 360-4.14 MG/200ML-% (1.8 mg/mL) IV infusion (0 mg/hr Intravenous Stopped 04/26/23 2240)  methylPREDNISolone sodium succinate (SOLU-MEDROL) 40 mg/mL injection 40 mg (40 mg Intravenous Given 04/27/23  0350)  tranexamic acid (CYKLOKAPRON) 1000 MG/10ML nebulizer solution 500 mg (500 mg Nebulization Given 04/27/23 0652)  cefTRIAXone (ROCEPHIN) 2 g in sodium chloride 0.9 % 100 mL IVPB (0 g Intravenous Stopped 04/26/23 1829)  doxycycline (VIBRA-TABS) tablet 100 mg (100 mg Oral Given 04/26/23 2237)  sodium chloride flush (NS) 0.9 % injection 3 mL (3 mLs Intravenous Given 04/26/23 2242)  polyethylene glycol (MIRALAX / GLYCOLAX) packet 17 g (has no administration in time range)  insulin aspart (novoLOG) injection 0-9 Units (2 Units Subcutaneous Given 04/27/23 0852)  metoprolol tartrate (LOPRESSOR) tablet 25 mg (25 mg Oral Given 04/27/23 0830)  acetaminophen (TYLENOL) tablet 650 mg (has no administration in time range)  furosemide (LASIX) injection 40 mg (40 mg Intravenous Given 04/27/23 0851)  ceFEPIme (MAXIPIME) 2 g in sodium chloride 0.9 % 100 mL IVPB (0 g Intravenous Stopped 04/26/23 1457)  vancomycin (VANCOREADY) IVPB 1250 mg/250 mL (0 mg Intravenous Stopped 04/26/23 1712)  amiodarone (NEXTERONE) 1.5 mg/mL IV bolus only 150 mg (0 mg Intravenous Stopped 04/26/23 1421)  furosemide (LASIX) injection 40 mg (40 mg Intravenous Given 04/26/23 1715)  iohexol (OMNIPAQUE) 350 MG/ML injection 75 mL (75 mLs Intravenous Contrast Given 04/26/23 1607)  albuterol (PROVENTIL) (2.5 MG/3ML) 0.083% nebulizer solution 5 mg (5 mg Nebulization Given 04/26/23 1821)  ipratropium (ATROVENT HFA) inhaler 2 puff (2 puffs Inhalation Given 04/26/23 1830)  potassium chloride SA (KLOR-CON M) CR tablet 40 mEq (40 mEq Oral Given 04/27/23 0140)    Mobility non-ambulatory     Focused Assessments Pulmonary Assessment Handoff:  Lung sounds: Bilateral Breath Sounds: Diminished O2 Device: Nasal Cannula O2 Flow Rate (L/min): 7 L/min    R Recommendations: See Admitting Provider Note  Report given to:   Additional Notes:

## 2023-04-27 NOTE — Progress Notes (Addendum)
Progress Note  Patient Name: Vincent Velez Date of Encounter: 04/27/2023  [facility] Cardiologist: Donato Schultz, MD   Patient Profile     Subjective   Admitted with progressive shortness of breath and palpitations overnight into this morning as well as cough with blood-tinged mucus and some sharp chest and back pain.  He has had a 40 pound weight loss in the past 6 months.  He has permanent atrial fibrillation was found of heart rates in the 120s to 130s on admission.  Normally elevated troponin at 36 and 37 and minimally elevated BNP at 158.  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  No evidence of PE on chest CT. felt possibly to have acute pneumonitis from [place] versus fluid component versus other.  Not on IV Amio infusion, IV Lasix and IV antibiotics on admission.  Noted to be thrombocytopenic and Eliquis is on hold.  Also has known recurrent lung cancer status post palliative chemotherapy.  He is DNR.   Currently in A-fib with RVR to 120 bpm.  Complains of occasional sharp chest pain and still having shortness of breath and fatigue.  IV amiodarone has subsequently been discontinued for fear of potential cardioversion and anticoagulation currently on hold due to hemoptysis.  Inpatient Medications    Scheduled Meds:  doxycycline  100 mg Oral Q12H   feeding supplement  237 mL Oral BID BM   furosemide  40 mg Intravenous Q12H   insulin aspart  0-9 Units Subcutaneous TID WC   methylPREDNISolone (SOLU-MEDROL) injection  40 mg Intravenous Q12H   metoprolol tartrate  25 mg Oral Q6H   sodium chloride flush  3 mL Intravenous Q12H   tranexamic acid  500 mg Nebulization Q8H   Continuous Infusions:  cefTRIAXone (ROCEPHIN)  IV Stopped (04/26/23 1829)   PRN Meds: acetaminophen, polyethylene glycol   Vital Signs    Vitals:   04/27/23 0945 04/27/23 1017 04/27/23 1100 04/27/23 1313  BP:  122/68  107/79  Pulse: (!) 104 (!) 104  92 99  Resp: (!) 28 (!) 26 (!) 21 (!) 26  Temp:  97.7 F (36.5 C)  97.6 F (36.4 C)  TempSrc:  Oral  Axillary  SpO2: 94% 92%  93%  Weight:      Height:        Intake/Output Summary (Last 24 hours) at 04/27/2023 1320 Last data filed at 04/27/2023 1226 Gross per 24 hour  Intake 787.12 ml  Output 1000 ml  Net -212.88 ml      04/26/2023   12:46 PM 04/14/2023    3:18 PM 03/30/2023    3:14 PM  Last 3 Weights  Weight (lbs) 145 lb 146 lb 3.2 oz 145 lb 11.2 oz  Weight (kg) 65.772 kg 66.316 kg 66.089 kg      Telemetry    Atrial fibrillation with RVR- Personally Reviewed  ECG    Atrial flutter with RVR 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  Respiratory: Clear to auscultation bilaterally. GI: Soft, nontender, non-distended  MS: No edema; No deformity. Neuro:  Nonfocal  Psych: Normal affect   Labs    High Sensitivity Troponin:   Recent Labs  Lab 04/26/23 1255 04/26/23 1402  TROPONINIHS 36* 37*      Chemistry Recent Labs  Lab 04/26/23 1255 04/27/23 0247  NA 132* 135  K 3.8 3.9  CL 102 105  CO2 20* 19*  GLUCOSE 130* 205*  BUN 24* 27*  CREATININE 0.92 0.96  CALCIUM 9.2 9.2  PROT 7.4 7.1  ALBUMIN 3.1* 2.8*  AST 37 38  ALT 17 17  ALKPHOS 84 84  BILITOT 1.5* 0.9  GFRNONAA >60 >60  ANIONGAP 10 11     Hematology Recent Labs  Lab 04/26/23 1255 04/27/23 0247  WBC 11.8*  12.1* 8.7  RBC 3.91*  3.90* 3.81*  HGB 12.0*  11.8* 11.4*  HCT 36.9*  36.3* 35.5*  MCV 94.4  93.1 93.2  MCH 30.7  30.3 29.9  MCHC 32.5  32.5 32.1  RDW 15.8*  15.8* 15.8*  PLT 41*  42* 43*    BNP Recent Labs  Lab 04/26/23 1402 04/27/23 0247  BNP 158.0* 138.8*     DDimer No results for input(s): "DDIMER" in the last 168 hours.   Radiology    CT Angio Chest PE W and/or Wo Contrast  Result Date: 04/26/2023 CLINICAL DATA:   Hemoptysis.  History of lung cancer. EXAM: CT ANGIOGRAPHY CHEST WITH CONTRAST TECHNIQUE: Multidetector CT imaging of the chest was performed using the standard protocol during bolus administration of intravenous contrast. Multiplanar CT image reconstructions and MIPs were obtained to evaluate the vascular anatomy. RADIATION DOSE REDUCTION: This exam was performed according to the departmental dose-optimization program which includes automated exposure control, adjustment of the mA and/or kV according to patient size and/or use of iterative reconstruction technique. CONTRAST:  75mL OMNIPAQUE IOHEXOL 350 MG/ML SOLN COMPARISON:  February 26, 2023.  January 20, 2023. FINDINGS: Cardiovascular: Satisfactory opacification of the pulmonary arteries to the segmental level. No evidence of pulmonary embolism. Mild cardiomegaly. Status post transcatheter aortic valve repair. Coronary artery calcifications are noted. No pericardial effusion. Mediastinum/Nodes: Esophagus is unremarkable. Thyroid gland is unremarkable. 17 mm right hilar lymph node is noted concerning for malignancy. 16 mm pretracheal lymph node is noted concerning for malignancy. 13 mm subcarinal lymph node is noted consistent with malignancy. Lungs/Pleura: No pneumothorax is noted. Interval development of patchy airspace opacities throughout both lungs most consistent with multifocal pneumonia. Minimal left pleural effusion is noted. Upper Abdomen: No acute abnormality. Musculoskeletal: No chest wall abnormality. No acute or significant osseous findings. Review of the MIP images confirms the above findings. IMPRESSION: No definite evidence of pulmonary embolus. Interval development of diffuse patchy airspace opacities throughout both lungs most consistent with multifocal pneumonia. Minimal left pleural effusion. Coronary artery calcifications are noted suggesting coronary artery disease. Mildly enlarged mediastinal lymph nodes are noted concerning for metastatic  disease given the history of lung cancer. Aortic Atherosclerosis (ICD10-I70.0). Electronically Signed   By: Lupita Raider M.D.   On: 04/26/2023 16:44   DG Chest Portable 1 View  Result Date: 04/26/2023 CLINICAL DATA:  Hemoptysis. History of lung cancer and atrial fibrillation. EXAM: PORTABLE CHEST 1 VIEW COMPARISON:  CT 01/20/2023. PET-CT 02/26/2023. Radiographs 03/30/2021 and 03/05/2021. FINDINGS: 1327 hours. Low lung volumes. The heart size and mediastinal contours are grossly stable status post TAVR procedure. There is extensive subpleural reticulation and interstitial prominence in both lungs which has significantly progressed from the 2022 radiographs. Pulmonary findings also appear progressive compared with the chest CT of 3 months ago but similar to the more recent PET-CT of 2 months ago. No confluent airspace disease, large pleural effusion or pneumothorax identified. The bones appear unchanged. Telemetry leads overlie the chest. IMPRESSION: Extensive subpleural reticulation and interstitial prominence in both lungs, progressive compared with the  chest CT of 3 months ago. 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 Differential includes atypical infection and lymphangitic tumor based on prior cross-sectional imaging. Continued follow-up recommended. Electronically Signed   By: Carey Bullocks M.D.   On: 04/26/2023 14:19    Patient Profile     82 y.o. male with a hx of hypertension, hyperlipidemia, diabetes, GERD, PAD, COPD, atrial fibrillation, anxiety, glaucoma, AAA, aortic stenosis status post TAVR, lung cancer on immunotherapy  who is being seen 04/27/2023 for the evaluation of AF RVR at the request of John Giovanni MD.   Assessment & Plan    Atrial fibrillation/flutter with RVR -Patient has a history of permanent atrial fibrillation on chronic DOAC with Xarelto 20 mg daily, Cardizem SR 60 mg twice  daily and Toprol XL 25 mg daily at home. -Presented with complaints of fatigue, increasing shortness of breath and pleuritic chest pain as well as 35 pound weight loss and hemoptysis with thrombocytopenia.  Has known lung cancer status post prior resection but now metastatic on chemo admitted with acute hypoxic respiratory failure -Heart rate fast initially likely related to hypoxemia and underlying progressive lung disease with chest CT showing progression of ILD and possible CA -Started on IV Amio but then this was stopped. -heart Rate currently in the low 100s and SBP 107 -Cardizem on hold for now as patient was thought to possibly be in acute heart failure although BNP minimally elevated at 138 -Continue Lopressor 25 mg every 6 hours (was on Toprol XL 25 mg daily at home) -Heart rate should improve with treatment of hypoxemia -Given significant interstitial lung disease and inability to be anticoagulated would not start on IV Amio  -For now we will continue current medical therapy and continue to watch on telemetry.  Heart rate should improve with improvement of his respiratory failure  Aortic stenosis -Status post TAVR -echo 2022 with moderate perivalvular AR  Hypoxemic respiratory failure -Multifactorial from progression of metastatic adeno CA the lung and progressive ILD/COPD. -Do not think he is markedly volume overloaded on exam -He has no lower extremity edema, chest x-ray showed findings consistent with progressive interstitial lung disease and possibly related to his lung CA with atypical infection and lymphangitic tumor but no edema -Chest CT due to respiratory failure and hemoptysis showed no evidence of acute PE but did show patchy airspace opacities throughout both lungs consistent with multifocal pneumonia and enlarged mediastinal lymph nodes concerning for metastatic lung CA -Per TRH/pulmonary -Currently is on IV Lasix but again I do not think that he is volume overloaded based  on exam, chest a x-ray and CT findings   I have personally spent 40 minutes involved in face-to-face and non-face-to-face activities for this patient on the day of the visit. Professional time spent includes the following activities: Preparing to see the patient (review of tests), Obtaining and/or reviewing separately obtained history (admission/discharge record), Performing a medically appropriate examination and/or evaluation , Ordering medications/tests/procedures, referring and communicating with other health care professionals, Documenting clinical information in the EMR, Independently interpreting results (not separately reported), Communicating results to the patient/family/caregiver, Counseling and educating the patient/family/caregiver and Care coordination (not separately reported).     For questions or updates, please contact [facility] Please consult [email or web address] for contact info under        Signed, Armanda Magic, MD  04/27/2023, 1:20 PM

## 2023-04-27 NOTE — Progress Notes (Signed)
  Echocardiogram 2D Echocardiogram has been performed.  Vincent Velez 04/27/2023, 1:49 PM

## 2023-04-27 NOTE — Consult Note (Addendum)
Cardiology Consultation   Patient ID: Vincent Velez MRN: [id number]; DOB: [birth year]  Admit date: 04/26/2023 Date of Consult: 04/27/2023  PCP:  [name], MD   [facility] Providers Cardiologist:  Donato Schultz, MD        Patient Profile:   Vincent Velez is a 82 y.o. male with a hx of hypertension, hyperlipidemia, diabetes, GERD, PAD, COPD, atrial fibrillation, anxiety, glaucoma, AAA, aortic stenosis status post TAVR, lung cancer on immunotherapy  who is being seen 04/27/2023 for the evaluation of AF RVR at the request of John Giovanni MD.  History of Present Illness:   Mr. Ebsen started having progressive shortness of breath and palpitations yesterday and continued today. He was also having some hemoptysis. He notably has a hx of lung cancer that was initially diagnosed and resected in 2020, and then had a recurrence in 2022 followed by palliative chemo. He is currently on Tagrisso.  He was found to be in AF RVR in the ED. He is compliant with AC at home (xarelto 20md daily). He was originally started on IV Amiodarone, but given his hemoptysis, his AC was going to be held.  He currently has some mild chest discomfort and shortness of breath and palpitations, but denies lightheadedness, dizziness.  Past Medical History:  Diagnosis Date   AAA (abdominal aortic aneurysm) (HCC)    Anxiety    Arthritis    Asthma    when younger   Atrial fibrillation (HCC)    CAD (coronary artery disease)    Cataract    removed both   COPD (chronic obstructive pulmonary disease) (HCC)    Diabetes mellitus    Type II   Dysrhythmia    afib   Esophageal reflux    Family history of malignant neoplasm of gastrointestinal tract    Heart murmur    Hiatal hernia    Hyperlipemia    Hypertension    Lung nodule    a. PET scan highly suspicious for malignancy. Bronch with biopsy to be done after TAVR   Malignant neoplasm of prostate (HCC)    prostate    Peripheral  vascular disease (HCC)    Pneumonia    Stricture and stenosis of esophagus     Past Surgical History:  Procedure Laterality Date   ABDOMINAL AORTA STENT     CARDIAC CATHETERIZATION     CARDIOVERSION N/A 12/04/2015   Procedure: CARDIOVERSION;  Surgeon: Thurmon Fair, MD;  Location: [facility] ENDOSCOPY;  Service: Cardiovascular;  Laterality: N/A;   COLONOSCOPY     FINGER SURGERY Right    middle finger- amputation   INSERTION PROSTATE RADIATION SEED     MEDIASTINOSCOPY N/A 01/06/2019   Procedure: MEDIASTINOSCOPY WITH BIOPIES;  Surgeon: Alleen Borne, MD;  Location: [facility] OR;  Service: Thoracic;  Laterality: N/A;   RIGHT/LEFT HEART CATH AND CORONARY ANGIOGRAPHY N/A 10/26/2018   Procedure: RIGHT/LEFT HEART CATH AND CORONARY ANGIOGRAPHY;  Surgeon: Kathleene Hazel, MD;  Location: [facility] INVASIVE CV LAB;  Service: Cardiovascular;  Laterality: N/A;   TEE WITHOUT CARDIOVERSION N/A 12/06/2018   Procedure: TRANSESOPHAGEAL ECHOCARDIOGRAM (TEE);  Surgeon: Kathleene Hazel, MD;  Location: [facility] INVASIVE CV LAB;  Service: Open Heart Surgery;  Laterality: N/A;   TRANSCATHETER AORTIC VALVE REPLACEMENT, TRANSFEMORAL N/A 12/06/2018   Procedure: TRANSCATHETER AORTIC VALVE REPLACEMENT, TRANSFEMORAL;  Surgeon: Kathleene Hazel, MD;  Location: [facility] INVASIVE CV LAB;  Service: Open Heart Surgery;  Laterality: N/A;   UPPER GASTROINTESTINAL ENDOSCOPY     VIDEO ASSISTED THORACOSCOPY (  VATS)/THOROCOTOMY Left 02/02/2019   Procedure: VIDEO ASSISTED THORACOSCOPY (VATS)/THOROCOTOMY;  Surgeon: Alleen Borne, MD;  Location: [facility] OR;  Service: Thoracic;  Laterality: Left;   VIDEO BRONCHOSCOPY N/A 01/06/2019   Procedure: VIDEO BRONCHOSCOPY;  Surgeon: Alleen Borne, MD;  Location: [facility] OR;  Service: Thoracic;  Laterality: N/A;   VIDEO BRONCHOSCOPY WITH ENDOBRONCHIAL ULTRASOUND N/A 03/07/2021   Procedure: VIDEO BRONCHOSCOPY WITH ENDOBRONCHIAL ULTRASOUND;  Surgeon: Loreli Slot, MD;  Location: [facility] OR;  Service: Thoracic;   Laterality: N/A;   WEDGE RESECTION Left 02/02/2019   Procedure: LUNG RESECTION;  Surgeon: Alleen Borne, MD;  Location: [facility] OR;  Service: Thoracic;  Laterality: Left;     Home Medications:  Prior to Admission medications   Medication Sig Start Date End Date Taking? Authorizing Provider  diltiazem (CARDIZEM SR) 60 MG 12 hr capsule Take 1 capsule (60 mg total) by mouth every 12 (twelve) hours. 06/06/19  Yes Janetta Hora, PA-C  ferrous sulfate 325 (65 FE) MG tablet Take 325 mg by mouth daily with breakfast.   Yes [provider]  glimepiride (AMARYL) 2 MG tablet Take 2 mg by mouth daily with breakfast.   Yes [provider]  JARDIANCE 25 MG TABS tablet Take 25 mg by mouth daily. 03/01/21  Yes [provider]  metFORMIN (GLUCOPHAGE-XR) 500 MG 24 hr tablet Take 1,000 mg by mouth every evening.  07/01/18  Yes [provider]  metoprolol succinate (TOPROL-XL) 50 MG 24 hr tablet Take 25 mg by mouth daily. 03/10/23  Yes [provider]  osimertinib mesylate (TAGRISSO) 80 MG tablet Take 1 tablet (80 mg total) by mouth daily. 03/04/23  Yes Si Gaul, MD  predniSONE (DELTASONE) 10 MG tablet Take 1 tablet (10 mg total) by mouth daily with breakfast. 04/14/23  Yes Heilingoetter, Cassandra L, PA-C  simvastatin (ZOCOR) 40 MG tablet Take 40 mg by mouth at bedtime.   Yes [provider]  XARELTO 20 MG TABS tablet TAKE 1 TABLET BY MOUTH  DAILY WITH SUPPER 12/20/17  Yes Jake Bathe, MD  ACCU-CHEK GUIDE test strip USE TO CHECK YOUR BLOOD SUGAR ONCE DAILY 03/28/21   [provider]  dorzolamide-timolol (COSOPT) 22.3-6.8 MG/ML ophthalmic solution Place 1 drop into both eyes in the morning, at noon, and at bedtime. Patient not taking: Reported on 04/26/2023 06/05/19   [provider]  mirtazapine (REMERON) 15 MG tablet Take 1 tablet (15 mg total) by mouth at bedtime. Patient not taking: Reported on 04/26/2023 01/13/23   Heilingoetter,  Cassandra L, PA-C  Multiple Vitamins-Minerals (MULTIVITAMIN WITH MINERALS) tablet Take 1 tablet by mouth daily. Patient not taking: Reported on 04/26/2023    [provider]  Multiple Vitamins-Minerals (ZINC PO) Take 1 tablet by mouth daily. Patient not taking: Reported on 04/26/2023    [provider]  prochlorperazine (COMPAZINE) 10 MG tablet Take 1 tablet (10 mg total) by mouth every 6 (six) hours as needed for nausea or vomiting. Patient not taking: Reported on 04/26/2023 01/13/23   Heilingoetter, Cassandra L, PA-C  saline (AYR) GEL Place 1 Application into both nostrils every 4 (four) hours as needed. 03/19/23   Read Drivers, MD    Inpatient Medications: Scheduled Meds:  doxycycline  100 mg Oral Q12H   insulin aspart  0-9 Units Subcutaneous TID WC   methylPREDNISolone (SOLU-MEDROL) injection  40 mg Intravenous Q12H   metoprolol tartrate  25 mg Oral Q6H   simvastatin  40 mg Oral QHS   sodium chloride flush  3 mL Intravenous Q12H   tranexamic acid  500 mg Nebulization Q8H   Continuous Infusions:  cefTRIAXone (ROCEPHIN)  IV Stopped (04/26/23 1829)   PRN Meds: acetaminophen **OR** acetaminophen, polyethylene glycol  Allergies:    Allergies  Allergen Reactions   Macrolides And Ketolides Other (See Comments)    Pt unsure of this     Social History:   Social History   Socioeconomic History   Marital status: Widowed    Spouse name: Not on file   Number of children: 2   Years of education: Not on file   Highest education level: Not on file  Occupational History   Occupation: engineer-retired    Employer: [employer]  Tobacco Use   Smoking status: Former    Types: Pipe, Cigars    Quit date: 12/2018    Years since quitting: 4.3   Smokeless tobacco: Never  Vaping Use   Vaping status: Never Used  Substance and Sexual Activity   Alcohol use: Not Currently    Alcohol/week: 0.0 standard drinks of alcohol    Comment: occ   Drug use: No   Sexual  activity: Not Currently  Other Topics Concern   Not on file  Social History Narrative   Not on file   Social Determinants of Health   Financial Resource Strain: Not on file  Food Insecurity: No Food Insecurity (04/26/2023)   Hunger Vital Sign    Worried About Running Out of Food in the Last Year: Never true    Ran Out of Food in the Last Year: Never true  Transportation Needs: No Transportation Needs (04/26/2023)   PRAPARE - Administrator, Civil Service (Medical): No    Lack of Transportation (Non-Medical): No  Physical Activity: Not on file  Stress: Not on file  Social Connections: Not on file  Intimate Partner Violence: Not At Risk (04/26/2023)   Humiliation, Afraid, Rape, and Kick questionnaire    Fear of Current or Ex-Partner: No    Emotionally Abused: No    Physically Abused: No    Sexually Abused: No    Family History:   Family History  Problem Relation Age of Onset   Colon cancer Father        questionable   Diabetes Mother    Heart disease Mother        Heart Disease before age 60   Deep vein thrombosis Mother    Hyperlipidemia Mother    Hypertension Mother    Varicose Veins Mother    Hypertension Brother    Heart attack Brother    Prostate cancer Brother    Colon polyps Neg Hx    Esophageal cancer Neg Hx    Rectal cancer Neg Hx    Stomach cancer Neg Hx      ROS:  Please see the history of present illness.  All other ROS reviewed and negative.     Physical Exam/Data:   Vitals:   04/27/23 0300 04/27/23 0330 04/27/23 0400 04/27/23 0415  BP: 104/75 106/76 (!) 118/92   Pulse: 61 88 (!) 113 (!) 117  Resp: (!) 25 19 (!) 28 (!) 25  Temp:  97.7 F (36.5 C)    TempSrc:  Oral    SpO2: 93% 93% 96% 94%  Weight:      Height:        Intake/Output Summary (Last 24 hours) at 04/27/2023 0537 Last data filed at 04/27/2023 0134 Gross per 24 hour  Intake 547.12 ml  Output --  Net 547.12 ml      04/26/2023   12:46 PM 04/14/2023    3:18 PM  03/30/2023    3:14 PM  Last 3 Weights  Weight (lbs) 145 lb 146 lb 3.2 oz 145 lb 11.2 oz  Weight (kg) 65.772 kg 66.316 kg 66.089 kg     Body mass index is 21.41 kg/m.  General:  Well nourished, well developed, in no acute distress HEENT: normal Neck: no JVD Vascular: No carotid bruits; Distal pulses 2+ bilaterally Cardiac:  normal S1, S2; irregularly irregular; no murmur  Lungs:  clear to auscultation bilaterally, no wheezing, rhonchi or rales  Abd: soft, nontender, no hepatomegaly  Ext: no edema Musculoskeletal:  No deformities, BUE and BLE strength normal and equal Skin: warm and dry  Neuro:  CNs 2-12 intact, no focal abnormalities noted Psych:  Normal affect   EKG:  The EKG was personally reviewed and demonstrates:  AF RVR Telemetry:  Telemetry was personally reviewed and demonstrates:  AF RVR  Relevant CV Studies:  1. Left ventricular ejection fraction, by estimation, is 50 to 55%. The  left ventricle has low normal function. The left ventricle has no regional  wall motion abnormalities. There is mild concentric left ventricular  hypertrophy. Left ventricular  diastolic function could not be evaluated.   2. Right ventricular systolic function is normal. The right ventricular  size is normal. There is normal pulmonary artery systolic pressure. The  estimated right ventricular systolic pressure is 24.0 mmHg.   3. Left atrial size was severely dilated.   4. Right atrial size was mild to moderately dilated.   5. The mitral valve is normal in structure. Trivial mitral valve  regurgitation. No evidence of mitral stenosis.   6. The aortic valve has been repaired/replaced. Aortic valve  regurgitation is mild to moderate. There is a 26 mm Sapien prosthetic  (TAVR) valve present in the aortic position. Procedure Date: 12/06/2018.  Aortic valve area, by VTI measures 1.26 cm. Aortic   valve mean gradient measures 9.6 mmHg.   7. Aortic dilatation noted. There is mild dilatation of the  ascending  aorta, measuring 40 mm.   8. The inferior vena cava is normal in size with <50% respiratory  variability, suggesting right atrial pressure of 8 mmHg.   Laboratory Data:  High Sensitivity Troponin:   Recent Labs  Lab 04/26/23 1255 04/26/23 1402  TROPONINIHS 36* 37*     Chemistry Recent Labs  Lab 04/26/23 1255 04/26/23 1402 04/27/23 0247  NA 132*  --  135  K 3.8  --  3.9  CL 102  --  105  CO2 20*  --  19*  GLUCOSE 130*  --  205*  BUN 24*  --  27*  CREATININE 0.92  --  0.96  CALCIUM 9.2  --  9.2  MG  --  2.1  --   GFRNONAA >60  --  >60  ANIONGAP 10  --  11    Recent Labs  Lab 04/26/23 1255 04/27/23 0247  PROT 7.4 7.1  ALBUMIN 3.1* 2.8*  AST 37 38  ALT 17 17  ALKPHOS 84 84  BILITOT 1.5* 0.9   Lipids No results for input(s): "CHOL", "TRIG", "HDL", "LABVLDL", "LDLCALC", "CHOLHDL" in the last 168 hours.  Hematology Recent Labs  Lab 04/26/23 1255 04/27/23 0247  WBC 11.8*  12.1* 8.7  RBC 3.91*  3.90* 3.81*  HGB 12.0*  11.8* 11.4*  HCT 36.9*  36.3* 35.5*  MCV 94.4  93.1 93.2  MCH 30.7  30.3 29.9  MCHC 32.5  32.5 32.1  RDW 15.8*  15.8* 15.8*  PLT 41*  42* 43*   Thyroid No results for input(s): "TSH", "FREET4" in the last 168 hours.  BNP Recent Labs  Lab 04/26/23 1402 04/27/23 0247  BNP 158.0* 138.8*    DDimer No results for input(s): "DDIMER" in the last 168 hours.   Radiology/Studies:  CT Angio Chest PE W and/or Wo Contrast  Result Date: 04/26/2023 CLINICAL DATA:  Hemoptysis.  History of lung cancer. EXAM: CT ANGIOGRAPHY CHEST WITH CONTRAST TECHNIQUE: Multidetector CT imaging of the chest was performed using the standard protocol during bolus administration of intravenous contrast. Multiplanar CT image reconstructions and MIPs were obtained to evaluate the vascular anatomy. RADIATION DOSE REDUCTION: This exam was performed according to the departmental dose-optimization program which includes automated exposure control, adjustment of  the mA and/or kV according to patient size and/or use of iterative reconstruction technique. CONTRAST:  75mL OMNIPAQUE IOHEXOL 350 MG/ML SOLN COMPARISON:  February 26, 2023.  January 20, 2023. FINDINGS: Cardiovascular: Satisfactory opacification of the pulmonary arteries to the segmental level. No evidence of pulmonary embolism. Mild cardiomegaly. Status post transcatheter aortic valve repair. Coronary artery calcifications are noted. No pericardial effusion. Mediastinum/Nodes: Esophagus is unremarkable. Thyroid gland is unremarkable. 17 mm right hilar lymph node is noted concerning for malignancy. 16 mm pretracheal lymph node is noted concerning for malignancy. 13 mm subcarinal lymph node is noted consistent with malignancy. Lungs/Pleura: No pneumothorax is noted. Interval development of patchy airspace opacities throughout both lungs most consistent with multifocal pneumonia. Minimal left pleural effusion is noted. Upper Abdomen: No acute abnormality. Musculoskeletal: No chest wall abnormality. No acute or significant osseous findings. Review of the MIP images confirms the above findings. IMPRESSION: No definite evidence of pulmonary embolus. Interval development of diffuse patchy airspace opacities throughout both lungs most consistent with multifocal pneumonia. Minimal left pleural effusion. Coronary artery calcifications are noted suggesting coronary artery disease. Mildly enlarged mediastinal lymph nodes are noted concerning for metastatic disease given the history of lung cancer. Aortic Atherosclerosis (ICD10-I70.0). Electronically Signed   By: Lupita Raider M.D.   On: 04/26/2023 16:44   DG Chest Portable 1 View  Result Date: 04/26/2023 CLINICAL DATA:  Hemoptysis. History of lung cancer and atrial fibrillation. EXAM: PORTABLE CHEST 1 VIEW COMPARISON:  CT 01/20/2023. PET-CT 02/26/2023. Radiographs 03/30/2021 and 03/05/2021. FINDINGS: 1327 hours. Low lung volumes. The heart size and mediastinal contours  are grossly stable status post TAVR procedure. There is extensive subpleural reticulation and interstitial prominence in both lungs which has significantly progressed from the 2022 radiographs. Pulmonary findings also appear progressive compared with the chest CT of 3 months ago but similar to the more recent PET-CT of 2 months ago. No confluent airspace disease, large pleural effusion or pneumothorax identified. The bones appear unchanged. Telemetry leads overlie the chest. IMPRESSION: Extensive subpleural reticulation and interstitial prominence in both lungs, progressive compared with the chest CT of 3 months ago. 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 Differential includes atypical infection and lymphangitic tumor based on prior cross-sectional imaging. Continued follow-up recommended. Electronically Signed   By: Carey Bullocks M.D.   On: 04/26/2023 14:19     Assessment and Plan:   AF RVR: AC stopped due to hemoptysis. Was initially on Amio per primary team. - Stop amiodarone as he will not be continued on [facility] - Avoid diltiazem due to volume overload / relative  hypotension - Rate control with metoprolol tartrate 25mg  q6h - PRN IV metop for RVR sustained >140bpm - consider trial of heparin gtt for [facility]  AS s/p TAVR: previously noted to have moderate PVL AAA: s/p EVAR 2010 HTN HLD  Risk Assessment/Risk Scores:          CHA2DS2-VASc Score =     This indicates a  % annual risk of stroke. The patient's score is based upon:           For questions or updates, please contact [facility] Please consult [email or web address] for contact info under    Signed, Maurice Desanctis, MD  04/27/2023 5:37 AM

## 2023-04-27 NOTE — Progress Notes (Signed)
Paged Chelsea RT about patient condition he is currently on 12L and only at 83%. Will move patient to 15L. Awaiting RT

## 2023-04-27 NOTE — Progress Notes (Signed)
Notified McClung MD of patient's RED mews. States he wil review patient chart and see if any adjustments need to be made at the moment. Cardiology is following the patient

## 2023-04-27 NOTE — Congregational Nurse Program (Cosign Needed Addendum)
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 SP wedge resection LUL. With disease recurrence and September 2022 with metastatic mediastinal lymphadenopathy as well as left hilar and left pleural metastatic disease . He was maintained on Tarceva for 20 months  and was placed on Tagrisso 80 mg p.o. daily on ~03/05/23 He has had a weight loss of about 35 pounds . Additional health history includes COPD, Progressive ILD, and atrial fibrillation on Eliquis.  Pt. Presented to the ED 11/25 with hypoxic respiratory failure with hemoptysis. He states he has had shortness of breath for about 3 weeks , and then 11/24 he started coughing up thick blood tinges mucus. CXR was + for progressive ILD and inflammation , vs atypical infection vs volume overload. He was treated with oxygen, antibiotics in the ED. [facility] will be admitting. PCCM was consulted for a Pulmonary consult.   Pertinent  Medical History   Past Medical History:  Diagnosis Date   AAA (abdominal aortic aneurysm) (HCC)    Anxiety    Arthritis    Asthma    when younger   Atrial fibrillation (HCC)    CAD (coronary artery disease)     Cataract    removed both   COPD (chronic obstructive pulmonary disease) (HCC)    Diabetes mellitus    Type II   Dysrhythmia    afib   Esophageal reflux    Family history of malignant neoplasm of gastrointestinal tract    Heart murmur    Hiatal hernia    Hyperlipemia    Hypertension    Lung nodule    a. PET scan highly suspicious for malignancy. Bronch with biopsy to be done after TAVR   Malignant neoplasm of prostate [facility])    prostate    Peripheral vascular disease (HCC)    Pneumonia    Stricture and stenosis of esophagus      Significant Hospital Events: Including procedures, antibiotic start and stop dates in addition to other pertinent events   04/26/2023 Admission to [facility] for acute respiratory Failure  Interim History / Subjective:  No acute distress Objective   Blood pressure 106/74, pulse 95, temperature 97.6 F (36.4 C), temperature source Oral, resp. rate (!) 24, height 5\' 9"  (1.753 m), weight 65.8 kg, SpO2 93%.    FiO2 (%):  [8 %] 8 %   Intake/Output Summary (Last 24 hours) at 04/27/2023 0916 Last data filed at 04/27/2023 0134 Gross per 24 hour  Intake 547.12 ml  Output --  Net 547.12 ml   Filed Weights   04/26/23 1246  Weight: 65.8 kg    Examination: Awake and alert no acute distress no further complaints of hemoptysis JVD  or lymphadenopathy is appreciated Decreased breath sounds throughout Heart sounds are irregular Abdomen soft nontender Lower extremities with 1+ edema  Resolved Hospital Problem list     Assessment & Plan:  Acute Respiratory Failure in the setting of NSC Lung Cancer, Progressive ILD, Infection vs Fluid fluid overload.  Plan O2 as needed to keep sats greater than 88% CT of the chest was negative Diuresed Steroids given Cardiology is following for his atrial fibrillation with rapid ventricular response Empirical antimicrobial therapy Procalcitonin was 0.40 White count is 8.7 Swallow evaluation has not been completed Pain  DNR status    Hemoptysis of 1 day duration in setting of NSC Lung Cancer / ? Atypical infection / Related to  current chemo Stable CBC Plan CT of the chest 04/26/2023 was negative for pulmonary embolism Monitor for any further hemoptysis Monitor CBC TXA Nebs Q 8   Pulmonary critical care available as needed        Best Practice (right click and "Reselect all SmartList Selections" daily)   Per PrimaryTeam Daughter was updated by Dr. Katrinka Blazing at bedside 11/25 Labs   CBC: Recent Labs  Lab 04/26/23 1255 04/27/23 0247  WBC 11.8*  12.1* 8.7  NEUTROABS 10.1*  --   HGB 12.0*  11.8* 11.4*  HCT 36.9*  36.3* 35.5*  MCV 94.4  93.1 93.2  PLT 41*  42* 43*    Basic Metabolic Panel: Recent Labs  Lab 04/26/23 1255 04/26/23 1402 04/27/23 0247  NA 132*  --  135  K 3.8  --  3.9  CL 102  --  105  CO2 20*  --  19*  GLUCOSE 130*  --  205*  BUN 24*  --  27*  CREATININE 0.92  --  0.96  CALCIUM 9.2  --  9.2  MG  --  2.1  --    GFR: Estimated Creatinine Clearance: 57.1 mL/min (by C-G formula based on SCr of 0.96 mg/dL). Recent Labs  Lab 04/26/23 1255 04/26/23 1310 04/26/23 1509 04/27/23 0247  PROCALCITON  --   --   --  0.40  WBC 11.8*  12.1*  --   --  8.7  LATICACIDVEN  --  1.0 1.0  --     Liver Function Tests: Recent Labs  Lab 04/26/23 1255 04/27/23 0247  AST 37 38  ALT 17 17  ALKPHOS 84 84  BILITOT 1.5* 0.9  PROT 7.4 7.1  ALBUMIN 3.1* 2.8*   No results for input(s): "LIPASE", "AMYLASE" in the last 168 hours. No results for input(s): "AMMONIA" in the last 168 hours.  ABG    Component Value Date/Time   PHART 7.379 02/03/2019 0424   PCO2ART 39.4 02/03/2019 0424   PO2ART 92.0 02/03/2019 0424   HCO3 23.2 02/03/2019 0424   TCO2 24 02/03/2019 0424   ACIDBASEDEF 2.0 02/03/2019 0424   O2SAT 97.0 02/03/2019 0424     Coagulation Profile: Recent Labs  Lab 04/26/23 1255  INR 2.0*    Cardiac Enzymes: No results for input(s): "CKTOTAL", "CKMB",  "CKMBINDEX", "TROPONINI" in the last 168 hours.  HbA1C: Hgb A1c MFr Bld  Date/Time Value Ref Range Status  01/25/2023 04:42 PM 6.6 (H) 4.8 - 5.6 % Final    Comment:    (NOTE) Pre diabetes:          5.7%-6.4%  Diabetes:              >6.4%  Glycemic control for   <7.0% adults with diabetes   01/30/2019 03:57 PM 7.1 (H)  4.8 - 5.6 % Final    Comment:    (NOTE) Pre diabetes:          5.7%-6.4% Diabetes:              >6.4% Glycemic control for   <7.0% adults with diabetes     CBG: Recent Labs  Lab 04/27/23 0237 04/27/23 0650  GLUCAP 230* 173*   Steve Viviann Broyles ACNP Acute Care Nurse Practitioner [facility] Pulmonary/Critical Care Please consult Amion 04/27/2023, 9:16 AM

## 2023-04-27 NOTE — Progress Notes (Signed)
RT called about patient desating on 12L salter. Patient currently on NRB and fluctuating between 87 and 88%. Spoke with patient concerning BiPAP but patient seems unsure about whether or not he would want to try. RN administered lasix. RT will be available until further decisions are made.

## 2023-04-27 NOTE — Inpatient Diabetes Management (Signed)
Inpatient Diabetes Program Recommendations  AACE/ADA: New Consensus Statement on Inpatient Glycemic Control (2015)  Target Ranges:  Prepandial:   less than 140 mg/dL      Peak postprandial:   less than 180 mg/dL (1-2 hours)      Critically ill patients:  140 - 180 mg/dL   Lab Results  Component Value Date   GLUCAP 356 (H) 04/27/2023   HGBA1C 6.6 (H) 01/25/2023    Review of Glycemic Control  Latest Reference Range & Units 04/27/23 06:50 04/27/23 11:54  Glucose-Capillary 70 - 99 mg/dL 027 (H) 253 (H)  (H): Data is abnormally high  Diabetes history: DM2 Outpatient Diabetes medications: Amaryl 2 mg every day, Metformin 1000 mg every day, Jardiance 25 mg QD Current orders for Inpatient glycemic control: Novolog 0-9 units TID and Solumedrol 40 mg Q12H  Inpatient Diabetes Program Recommendations:    Please consider:  Novolog 3 units TID with meals if he consumes at least 50%.  Will continue to follow while inpatient.  Thank you, Dulce Sellar, MSN, CDCES Diabetes Coordinator Inpatient Diabetes Program [phone] (team pager from 8a-5p)

## 2023-04-27 NOTE — Progress Notes (Signed)
Notified by RN that patient remains in A-fib, tachycardic to 120-130s with frequent PVCs on telemetry.  Most recent blood pressure 109/82.  Patient is on scheduled metoprolol succinate 25 mg daily and diltiazem 60 mg every 12 hours.  He also received amiodarone bolus x 2 on admission and was placed on amiodarone drip.    Magnesium 2.1.  Potassium 3.8, will give Potassium supplement to keep K >4.  Discussed with cardiology (Dr. Laurelyn Sickle), recommending stopping amiodarone as patient's anticoagulation has been held due to hemoptysis.  Also recommending stopping diltiazem and metoprolol succinate.  Instead recommending switching to metoprolol tartrate 25 mg every 6 hours.  Cardiology will see the patient, appreciate recommendations.

## 2023-04-27 NOTE — Evaluation (Signed)
Clinical/Bedside Swallow Evaluation Patient Details  Name: Vincent Velez MRN: [id number] Date of Birth: 06-Aug-1940  Today's Date: 04/27/2023 Time: SLP Start Time (ACUTE ONLY): 7829 SLP Stop Time (ACUTE ONLY): 0834 SLP Time Calculation (min) (ACUTE ONLY): 10 min  Past Medical History:  Past Medical History:  Diagnosis Date   AAA (abdominal aortic aneurysm) (HCC)    Anxiety    Arthritis    Asthma    when younger   Atrial fibrillation (HCC)    CAD (coronary artery disease)    Cataract    removed both   COPD (chronic obstructive pulmonary disease) (HCC)    Diabetes mellitus    Type II   Dysrhythmia    afib   Esophageal reflux    Family history of malignant neoplasm of gastrointestinal tract    Heart murmur    Hiatal hernia    Hyperlipemia    Hypertension    Lung nodule    a. PET scan highly suspicious for malignancy. Bronch with biopsy to be done after TAVR   Malignant neoplasm of prostate (HCC)    prostate    Peripheral vascular disease (HCC)    Pneumonia    Stricture and stenosis of esophagus    Past Surgical History:  Past Surgical History:  Procedure Laterality Date   ABDOMINAL AORTA STENT     CARDIAC CATHETERIZATION     CARDIOVERSION N/A 12/04/2015   Procedure: CARDIOVERSION;  Surgeon: Thurmon Fair, MD;  Location: [facility];  Service: Cardiovascular;  Laterality: N/A;   COLONOSCOPY     FINGER SURGERY Right    middle finger- amputation   INSERTION PROSTATE RADIATION SEED     MEDIASTINOSCOPY N/A 01/06/2019   Procedure: MEDIASTINOSCOPY WITH BIOPIES;  Surgeon: Alleen Borne, MD;  Location: [facility] OR;  Service: Thoracic;  Laterality: N/A;   RIGHT/LEFT HEART CATH AND CORONARY ANGIOGRAPHY N/A 10/26/2018   Procedure: RIGHT/LEFT HEART CATH AND CORONARY ANGIOGRAPHY;  Surgeon: Kathleene Hazel, MD;  Location: [facility] INVASIVE CV LAB;  Service: Cardiovascular;  Laterality: N/A;   TEE WITHOUT CARDIOVERSION N/A 12/06/2018   Procedure: TRANSESOPHAGEAL ECHOCARDIOGRAM (TEE);   Surgeon: Kathleene Hazel, MD;  Location: [facility] INVASIVE CV LAB;  Service: Open Heart Surgery;  Laterality: N/A;   TRANSCATHETER AORTIC VALVE REPLACEMENT, TRANSFEMORAL N/A 12/06/2018   Procedure: TRANSCATHETER AORTIC VALVE REPLACEMENT, TRANSFEMORAL;  Surgeon: Kathleene Hazel, MD;  Location: [facility] INVASIVE CV LAB;  Service: Open Heart Surgery;  Laterality: N/A;   UPPER GASTROINTESTINAL ENDOSCOPY     VIDEO ASSISTED THORACOSCOPY (VATS)/THOROCOTOMY Left 02/02/2019   Procedure: VIDEO ASSISTED THORACOSCOPY (VATS)/THOROCOTOMY;  Surgeon: Alleen Borne, MD;  Location: [facility] OR;  Service: Thoracic;  Laterality: Left;   VIDEO BRONCHOSCOPY N/A 01/06/2019   Procedure: VIDEO BRONCHOSCOPY;  Surgeon: Alleen Borne, MD;  Location: [facility] OR;  Service: Thoracic;  Laterality: N/A;   VIDEO BRONCHOSCOPY WITH ENDOBRONCHIAL ULTRASOUND N/A 03/07/2021   Procedure: VIDEO BRONCHOSCOPY WITH ENDOBRONCHIAL ULTRASOUND;  Surgeon: Loreli Slot, MD;  Location: [facility] OR;  Service: Thoracic;  Laterality: N/A;   WEDGE RESECTION Left 02/02/2019   Procedure: LUNG RESECTION;  Surgeon: Alleen Borne, MD;  Location: [facility] OR;  Service: Thoracic;  Laterality: Left;   HPI:  Vincent Velez is a 82 y.o. male who presented with shortness of breath, hemoptysis, palpitations.  Chest CT 11/25: "Interval development of diffuse patchy airspace opacities throughout both lungs most consistent with multifocal pneumonia. Minimal left  pleural effusion."  Pt with medical history significant of hypertension, hyperlipidemia, diabetes, GERD, PAD, COPD,  atrial fibrillation, anxiety, glaucoma, AAA, aortic stenosis status post TAVR, lung cancer on immunotherapy.    Assessment / Plan / Recommendation  Clinical Impression  Pt presents with functional swallowing as assessed clinically.  Pt reports no difficulty with PO intake.  He had recently finished AM meal tray.  Pt exhibited infrequent (x1 with thin, x1 with solid), delayed cough which he denied was  related to PO intake.  Pt took pill with thin liquid wash without any difficulty. Tachycardia (high 120s) and mild tachypnea (22-26 with increase to 33 following coughing episode) noted, c/w pt's presenting symptoms.  Recommended taking rest breaks with meals as needed when feeling short of breath.  Pt reports he is typically weakest in the morning.  suspect findings of pna on chest imaging are attributable to sources other than aspiration.  Pt has no further ST needs.  SLP will sign off.    Recommend continuing regular texture diet with thin liquid.   SLP Visit Diagnosis: Dysphagia, unspecified (R13.10)    Aspiration Risk  Mild aspiration risk    Diet Recommendation Regular;Thin liquid    Liquid Administration via: Cup;Straw Medication Administration: Whole meds with liquid Supervision: Patient able to self feed Compensations: Slow rate;Small sips/bites Postural Changes: Seated upright at 90 degrees    Other  Recommendations Oral Care Recommendations: Oral care BID    Recommendations for follow up therapy are one component of a multi-disciplinary discharge planning process, led by the attending physician.  Recommendations may be updated based on patient status, additional functional criteria and insurance authorization.  Follow up Recommendations No SLP follow up      Assistance Recommended at Discharge    Functional Status Assessment Patient has not had a recent decline in their functional status  Frequency and Duration  (N/A)          Prognosis Prognosis for improved oropharyngeal function:  (N/A)      Swallow Study   General Date of Onset: 04/26/23 HPI: Vincent Velez is a 82 y.o. male who presented with shortness of breath, hemoptysis, palpitations.  Chest CT 11/25: "Interval development of diffuse patchy airspace opacities throughout both lungs most consistent with multifocal pneumonia. Minimal left  pleural effusion."  Pt with medical history significant of  hypertension, hyperlipidemia, diabetes, GERD, PAD, COPD, atrial fibrillation, anxiety, glaucoma, AAA, aortic stenosis status post TAVR, lung cancer on immunotherapy. Type of Study: Bedside Swallow Evaluation Previous Swallow Assessment: None Diet Prior to this Study: Regular;Thin liquids (Level 0) Temperature Spikes Noted: No Respiratory Status: Nasal cannula History of Recent Intubation: No Behavior/Cognition: Alert;Cooperative;Pleasant mood Oral Cavity Assessment: Within Functional Limits Oral Care Completed by SLP: No Oral Cavity - Dentition: Dentures, top;Edentulous Vision: Functional for self-feeding Self-Feeding Abilities: Able to feed self Patient Positioning: Upright in bed Baseline Vocal Quality: Hoarse Volitional Cough:  (Fair) Volitional Swallow: Able to elicit    Oral/Motor/Sensory Function Overall Oral Motor/Sensory Function: Within functional limits Facial ROM: Within Functional Limits Facial Symmetry: Within Functional Limits Lingual ROM: Within Functional Limits Lingual Symmetry: Within Functional Limits Lingual Strength: Within Functional Limits Velum: Within Functional Limits Mandible: Within Functional Limits   Ice Chips Ice chips: Not tested   Thin Liquid Thin Liquid: Impaired Pharyngeal  Phase Impairments: Cough - Delayed    Nectar Thick Nectar Thick Liquid: Not tested   Honey Thick Honey Thick Liquid: Not tested   Puree Puree: Within functional limits   Solid     Solid: Impaired Pharyngeal Phase Impairments: Cough - Delayed  Kerrie Pleasure, MA, CCC-SLP Acute Rehabilitation Services Office: [phone] 04/27/2023,8:52 AM

## 2023-04-28 ENCOUNTER — Other Ambulatory Visit: Payer: Self-pay

## 2023-04-28 DIAGNOSIS — J189 Pneumonia, unspecified organism: Secondary | ICD-10-CM | POA: Diagnosis not present

## 2023-04-28 DIAGNOSIS — I420 Dilated cardiomyopathy: Secondary | ICD-10-CM

## 2023-04-28 DIAGNOSIS — Z515 Encounter for palliative care: Secondary | ICD-10-CM | POA: Diagnosis not present

## 2023-04-28 DIAGNOSIS — R7989 Other specified abnormal findings of blood chemistry: Secondary | ICD-10-CM

## 2023-04-28 DIAGNOSIS — I4892 Unspecified atrial flutter: Secondary | ICD-10-CM | POA: Diagnosis not present

## 2023-04-28 DIAGNOSIS — I5020 Unspecified systolic (congestive) heart failure: Secondary | ICD-10-CM | POA: Diagnosis not present

## 2023-04-28 DIAGNOSIS — J80 Acute respiratory distress syndrome: Secondary | ICD-10-CM

## 2023-04-28 DIAGNOSIS — C3490 Malignant neoplasm of unspecified part of unspecified bronchus or lung: Secondary | ICD-10-CM | POA: Diagnosis not present

## 2023-04-28 DIAGNOSIS — J9601 Acute respiratory failure with hypoxia: Secondary | ICD-10-CM | POA: Diagnosis not present

## 2023-04-28 DIAGNOSIS — J9621 Acute and chronic respiratory failure with hypoxia: Secondary | ICD-10-CM | POA: Diagnosis not present

## 2023-04-28 LAB — COMPREHENSIVE METABOLIC PANEL
ALT: 20 U/L (ref 0–44)
AST: 44 U/L — ABNORMAL HIGH (ref 15–41)
Albumin: 3 g/dL — ABNORMAL LOW (ref 3.5–5.0)
Alkaline Phosphatase: 118 U/L (ref 38–126)
Anion gap: 13 (ref 5–15)
BUN: 44 mg/dL — ABNORMAL HIGH (ref 8–23)
CO2: 21 mmol/L — ABNORMAL LOW (ref 22–32)
Calcium: 9.7 mg/dL (ref 8.9–10.3)
Chloride: 102 mmol/L (ref 98–111)
Creatinine, Ser: 1.23 mg/dL (ref 0.61–1.24)
GFR, Estimated: 59 mL/min — ABNORMAL LOW (ref >60)
Glucose, Bld: 308 mg/dL — ABNORMAL HIGH (ref 70–99)
Potassium: 4 mmol/L (ref 3.5–5.1)
Sodium: 136 mmol/L (ref 135–145)
Total Bilirubin: 0.9 mg/dL (ref <1.2)
Total Protein: 7.5 g/dL (ref 6.5–8.1)

## 2023-04-28 LAB — CBC
HCT: 36.3 % — ABNORMAL LOW (ref 39.0–52.0)
Hemoglobin: 11.8 g/dL — ABNORMAL LOW (ref 13.0–17.0)
MCH: 29.7 pg (ref 26.0–34.0)
MCHC: 32.5 g/dL (ref 30.0–36.0)
MCV: 91.4 fL (ref 80.0–100.0)
Platelets: 36 10*3/uL — ABNORMAL LOW (ref 150–400)
RBC: 3.97 MIL/uL — ABNORMAL LOW (ref 4.22–5.81)
RDW: 15.6 % — ABNORMAL HIGH (ref 11.5–15.5)
WBC: 17.8 10*3/uL — ABNORMAL HIGH (ref 4.0–10.5)
nRBC: 0 % (ref 0.0–0.2)

## 2023-04-28 LAB — RESPIRATORY PANEL BY PCR

## 2023-04-28 LAB — GLUCOSE, CAPILLARY
Glucose-Capillary: 293 mg/dL — ABNORMAL HIGH (ref 70–99)
Glucose-Capillary: 259 mg/dL — ABNORMAL HIGH (ref 70–99)
Glucose-Capillary: 360 mg/dL — ABNORMAL HIGH (ref 70–99)
Glucose-Capillary: 285 mg/dL — ABNORMAL HIGH (ref 70–99)

## 2023-04-28 LAB — MAGNESIUM: Magnesium: 2.4 mg/dL (ref 1.7–2.4)

## 2023-04-28 MED ORDER — DOXYCYCLINE HYCLATE 100 MG PO TABS
100.0000 mg | ORAL_TABLET | Freq: Two times a day (BID) | ORAL | Status: DC
  Administered 2023-04-28 (×2): 100 mg via ORAL
  Filled 2023-04-28 (×2): qty 1

## 2023-04-28 MED ORDER — DIGOXIN 0.25 MG/ML IJ SOLN
0.2500 mg | Freq: Four times a day (QID) | INTRAMUSCULAR | Status: DC
  Administered 2023-04-28: 0.25 mg via INTRAVENOUS
  Filled 2023-04-28 (×2): qty 1

## 2023-04-28 MED ORDER — MORPHINE SULFATE (PF) 2 MG/ML IV SOLN: 1.0000 mg | INTRAVENOUS | Status: DC | PRN

## 2023-04-28 MED ORDER — AMIODARONE HCL IN DEXTROSE 360-4.14 MG/200ML-% IV SOLN
60.0000 mg/h | INTRAVENOUS | Status: AC
  Administered 2023-04-28 (×2): 60 mg/h via INTRAVENOUS
  Filled 2023-04-28 (×2): qty 200

## 2023-04-28 MED ORDER — METOPROLOL TARTRATE 50 MG PO TABS
50.0000 mg | ORAL_TABLET | Freq: Two times a day (BID) | ORAL | Status: DC
  Administered 2023-04-28: 50 mg via ORAL
  Filled 2023-04-28: qty 1

## 2023-04-28 MED ORDER — DIGOXIN 125 MCG PO TABS: 0.1250 mg | ORAL_TABLET | Freq: Every day | ORAL | Status: DC

## 2023-04-28 MED ORDER — AMIODARONE HCL IN DEXTROSE 360-4.14 MG/200ML-% IV SOLN
30.0000 mg/h | INTRAVENOUS | Status: DC
Start: 2023-04-28 — End: 2023-04-29
  Administered 2023-04-28 – 2023-04-29 (×2): 30 mg/h via INTRAVENOUS
  Filled 2023-04-28: qty 200

## 2023-04-28 MED ORDER — ADULT MULTIVITAMIN W/MINERALS CH: 1.0000 | ORAL_TABLET | Freq: Every day | ORAL | Status: DC

## 2023-04-28 MED ORDER — VANCOMYCIN HCL IN DEXTROSE 1-5 GM/200ML-% IV SOLN
1000.0000 mg | INTRAVENOUS | Status: DC
  Administered 2023-04-28: 1000 mg via INTRAVENOUS
  Filled 2023-04-28 (×2): qty 200

## 2023-04-28 MED ORDER — PIPERACILLIN-TAZOBACTAM 3.375 G IVPB
3.3750 g | Freq: Three times a day (TID) | INTRAVENOUS | Status: DC
  Administered 2023-04-28 – 2023-04-29 (×3): 3.375 g via INTRAVENOUS
  Filled 2023-04-28 (×4): qty 50

## 2023-04-28 MED ORDER — ENSURE ENLIVE PO LIQD
237.0000 mL | Freq: Three times a day (TID) | ORAL | Status: DC
  Administered 2023-04-28: 237 mL via ORAL

## 2023-04-28 MED ORDER — AMIODARONE LOAD VIA INFUSION
150.0000 mg | Freq: Once | INTRAVENOUS | Status: AC
  Administered 2023-04-28: 150 mg via INTRAVENOUS
  Filled 2023-04-28: qty 83.34

## 2023-04-28 MED ORDER — MORPHINE SULFATE (PF) 2 MG/ML IV SOLN
1.0000 mg | INTRAVENOUS | Status: DC | PRN
  Administered 2023-04-29: 1 mg via INTRAVENOUS
  Filled 2023-04-28: qty 1

## 2023-04-28 MED ORDER — METOPROLOL TARTRATE 50 MG PO TABS: 50.0000 mg | ORAL_TABLET | Freq: Two times a day (BID) | ORAL | Status: DC

## 2023-04-28 NOTE — Progress Notes (Signed)
Heart Failure Navigator Progress Note  Assessed for Heart & Vascular TOC clinic readiness.  Patient does not meet criteria due to per MD note with Metastatic Lung cancer.   Navigator will sign off at this time.   Rhae Hammock, BSN, Scientist, clinical (histocompatibility and immunogenetics) Only

## 2023-04-28 NOTE — Care Management Important Message (Signed)
Important Message  Patient Details  Name: Vincent Velez MRN: [id number] Date of Birth: 04-21-1941   Important Message Given:  Yes - Medicare IM     Sherilyn Banker 04/28/2023, 2:06 PM

## 2023-04-28 NOTE — Progress Notes (Signed)
   04/28/23 0310  Assess: MEWS Score  Temp 97.6 F (36.4 C)  BP (!) 114/94  MAP (mmHg) 102  Pulse Rate (!) 129  ECG Heart Rate (!) 130  Resp (!) 27  SpO2 93 %  O2 Device HFNC  O2 Flow Rate (L/min) 15 L/min  Assess: MEWS Score  MEWS Temp 0  MEWS Systolic 0  MEWS Pulse 3  MEWS RR 2  MEWS LOC 0  MEWS Score 5  MEWS Score Color Red  Assess: if the MEWS score is Yellow or Red  Were vital signs accurate and taken at a resting state? Yes  Does the patient meet 2 or more of the SIRS criteria? Yes  Does the patient have a confirmed or suspected source of infection? No  MEWS guidelines implemented  No, previously red, continue vital signs every 4 hours  Notify: Charge Nurse/RN  Name of Charge Nurse/RN Notified Therapist, nutritional  Provider Notification  Provider Name/Title Derrell Lolling MD  Date Provider Notified 04/28/23  Time Provider Notified [phone]  Method of Notification Page  Notification Reason Other (Comment) (HR has been sustaining 130s)  Provider response No new orders;Other (Comment) (continue to monitor)  Date of Provider Response 04/28/23  Time of Provider Response 0320  Assess: SIRS CRITERIA  SIRS Temperature  0  SIRS Pulse 1  SIRS Respirations  1  SIRS WBC 0  SIRS Score Sum  2

## 2023-04-28 NOTE — Hospital Course (Signed)
82 year old with a history of HTN, HLD, DM2, GERD, PAD, COPD, PAF, glaucoma, AAA, AOS status post TAVR, and lung cancer currently on immunotherapy who presented to the ER 11/25 with a 3-week history of steadily worsening shortness of breath with the acute onset of palpitations and blood-tinged sputum 3 days prior.  He described his hemoptysis as blood-tinged mucus but no clots.  He is currently on Tagrisso for his recurrent lung cancer.  In the ER he was found to have a heart rate of 120-130.  Respiratory panel was negative for COVID and RSV.  CXR noted extensive progressive reticulation and interstitial prominence consistent with progressive ILD.  CTa chest was negative for PE but showed reticular airspace disease consistent with pneumonia versus ILD with increasing lymphadenopathy.

## 2023-04-28 NOTE — Progress Notes (Signed)
Pharmacy Antibiotic Note  Vincent Velez is a 82 y.o. male admitted on 04/26/2023 with pneumonia.  Pharmacy has been consulted for vancomycin and Zosyn dosing. Cr ok.  Plan: Vancomycin 1000mg  IV q24h - est 515 Zosyn 3.375g IV EI q8h Follow Cr, LOT, Cx Vancomycin levels as needed   Height: 5\' 9"  (175.3 cm) Weight: 61.2 kg (134 lb 14.7 oz) IBW/kg (Calculated) : 70.7  Temp (24hrs), Avg:97.6 F (36.4 C), Min:97.6 F (36.4 C), Max:97.7 F (36.5 C)  Recent Labs  Lab 04/26/23 1255 04/26/23 1310 04/26/23 1509 04/27/23 0247 04/28/23 0344  WBC 11.8*  12.1*  --   --  8.7 17.8*  CREATININE 0.92  --   --  0.96 1.23  LATICACIDVEN  --  1.0 1.0  --   --     Estimated Creatinine Clearance: 41.5 mL/min (by C-G formula based on SCr of 1.23 mg/dL).    Allergies  Allergen Reactions   Macrolides And Ketolides Other (See Comments)    Pt unsure of this     Antimicrobials this admission: Cefepime 11/25 x1 Ceftriaxone 11/25 >> 11/26 Doxycycline 11/25 >> Vancomycin 11/25 x1, 11/27 >> Zosyn 11/27 >>   Microbiology results: 11/25 BCx: NGTD  Thank you for allowing pharmacy to be a part of this patient's care.   Fredonia Highland, PharmD, BCPS, [facility] Clinical Pharmacist [phone] Please check AMION for all [facility] Pharmacy numbers 04/28/2023

## 2023-04-28 NOTE — Progress Notes (Signed)
Palliative Medicine Progress Note   Patient Name: Vincent Velez       Date: 04/28/2023 DOB: 1940/12/11  Age: 82 y.o. MRN#: [id number] Attending Physician: Jerald Kief, MD Primary Care Physician: [name], MD Admit Date: 04/26/2023    HPI/Patient Profile: 82 y.o. male  with past medical history of lung cancer (s/p wedge resection and chemotherapy,  currently on immunotherapy), aortic stenosis s/p TAVR, paroxysmal atrial fibrillation, COPD, DM 2, GERD, PAD, HTN, and HLD.  He presented to the ED on 04/26/2023 with 3-week history of worsening shortness of breath as well as acute onset of palpitations and blood-tinged sputum.  CTA chest was negative for PE but showed reticular airspace disease consistent with pneumonia versus ILD with increasing lymphadenopathy. 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   Palliative Medicine has been consulted for goals of care and medical decision making.   Subjective: Chart reviewed. Escalating oxygen requirements to HHFNC. Sustaining AF/RVR in the 150's and now requiring amiodarone infusion.   Bedside visit. Patient with mildly labored breathing and accessory muscle use. Reports feeling some improvement in dyspnea with HHFNC.  I had a very open and honest discussion with patient and daughter about his current medical situation and very guarded prognosis. Discussed that his condition has so far not improved with treatment (antibiotics, steroids, diuresis).  Discussed there is unfortunately not much else to offer.   Patient and daughter both indicate they have considered the possibility he may not survive this hospitalization.  Introduced the concept of  comfort care, which means de-escalating medical support with the focus on comfort and dignity while allowing for a natural death. They plan to discuss further as a family. Emotional support provided.    Objective:  Physical Exam Vitals reviewed.  Constitutional:      Appearance: He is ill-appearing.     Comments: Mild acute distress  Cardiovascular:     Rate and Rhythm: Tachycardia present.  Pulmonary:     Effort: Tachypnea and accessory muscle usage present.  Neurological:     Mental Status: He is alert and oriented to person, place, and time.            O2 Device: O2 Device: Heated High Flow Nasal Cannula O2 Flow Rate: O2 Flow Rate (L/min): (  S) 55 L/min (Had to increase due to WOB)   Palliative Medicine Assessment & Plan   Assessment: Principal Problem:   Acute respiratory failure with hypoxia (HCC) Active Problems:   GERD   Persistent atrial fibrillation   Peripheral vascular disease (HCC)   Hypertension   Hyperlipemia   Diabetes mellitus (HCC)   COPD (chronic obstructive pulmonary disease) (HCC)   History of transcatheter aortic valve replacement (TAVR)   S/P thoracotomy   Adenocarcinoma, lung (HCC)   Abdominal aortic aneurysm without rupture (HCC)   Anxiety disorder   Aortic valve disorder   Glaucoma   Presence of prosthetic heart valve   Thrombocytopenia (HCC)   Hemoptysis   Atrial flutter with rapid ventricular response (HCC)   Nonrheumatic aortic valve insufficiency   Acute hypoxic respiratory failure (HCC)   HFrEF (heart failure with reduced ejection fraction) (HCC)   DCM (dilated cardiomyopathy) (HCC)   Elevated troponin    Recommendations/Plan: Continue current supportive interventions Morphine 1 mg IV every 4 hours as needed for dyspnea Would have a low threshold to transition to comfort care if patient's condition further deteriorates PMT will continue to follow  Code Status: DNR/DNI   Prognosis:  Very guarded  Discharge Planning: To Be  Determined  Care plan was discussed with Dr. Rhona Leavens and Dr. Katrinka Blazing  Thank you for allowing the Palliative Medicine Team to assist in the care of this patient.   Time: 65 minutes  Detailed review of medical records (labs, imaging, vital signs), medically appropriate exam, discussed with treatment team, counseling and education to patient, family, & staff, documenting clinical information, medication management, coordination of care.    Merry Proud, NP   Please contact Palliative Medicine Team phone at [phone] for questions and concerns.  For individual providers, please see AMION.

## 2023-04-28 NOTE — Progress Notes (Signed)
Patient placed on heated high flow at 45L/69%.  Tolerating well at this time.  Will continue to monitor.

## 2023-04-28 NOTE — Progress Notes (Signed)
   Patient Name: Vincent Velez Date of Encounter: 04/28/2023 [facility] Cardiologist: Donato Schultz, MD   Interval Summary  .    Patient reports marginally worsened breathing this morning but in no acute distress on HFNC. Does not have a clear sense of rapid HR. No hemoptysis reported today.   Vital Signs .    Vitals:   04/28/23 0310 04/28/23 0533 04/28/23 0738 04/28/23 0852  BP: (!) 114/94  (!) 120/100   Pulse: (!) 129 (!) 132 (!) 130   Resp: (!) 27 (!) 21 (!) 22   Temp: 97.6 F (36.4 C)  97.7 F (36.5 C)   TempSrc: Axillary  Oral   SpO2: 93% 93% 94%   Weight:    61.2 kg  Height:        Intake/Output Summary (Last 24 hours) at 04/28/2023 0945 Last data filed at 04/28/2023 0753 Gross per 24 hour  Intake 1390 ml  Output 3400 ml  Net -2010 ml      04/28/2023    8:52 AM 04/26/2023   12:46 PM 04/14/2023    3:18 PM  Last 3 Weights  Weight (lbs) 134 lb 14.7 oz 145 lb 146 lb 3.2 oz  Weight (kg) 61.2 kg 65.772 kg 66.316 kg      Telemetry/ECG    Atrial flutter with persistent tachycardia in the 130s - Personally Reviewed  Physical Exam .   GEN: No acute distress.   Neck: No JVD Cardiac: rapid rate, no murmurs, rubs, or gallops.  Respiratory: bibasilar fine crackles consistent with ILD. No rales.  GI: Soft, nontender, non-distended  MS: No edema  Assessment & Plan .     Atrial fibrillation/flutter with RVR  Patient presented with symptoms of fatigue, dyspnea, pleuritic chest pain with hemoptysis and 35 pounds weight loss. Longstanding hx permanent afib on chronic Xarelto 20mg  with Cardizem SR 60mg  BID and metoprolol succinate 25mg . Initially on IV amiodarone which was subsequently discontinued.   Telemetry now shows atrial flutter with ventricular rates sustaining in the 130s. Cardizem not an option with TTE showing LVEF 35-40%.  Transition Lopressor to 50mg  BID. Given limited options for rate control, will add Digoxin 0.25mg  IV q6hr x2 doses today then  0.125mg  PO daily starting tomorrow. Amio is a last resort option with lung disease.  Continue to hold anticoagulation, defer to pulm given hemoptysis.   Recommend involving palliative care team to establish goals of care.   S/P TAVR  Mean and peak gradients both increased from 2022 study. In 2022, mean gradient 9.50mmHg, peak gradient 18.32mHg. Now mean gradient 19.22mmHg with peak 32.75mmHg. DI 0.33->0.25. Will discuss changes with Dr. Mayford Knife.   Hypoxemic respiratory failure  HFrEF  EF down to 35-40% on TTE. However, on exam, patient without obvious volume overload. Minimally elevated BNP.  Suspect that resp failure is more a result of metastatic adenocarcinoma and progressive ILD/COPD rather than CHF. EF reduction likely tachy-mediated.   BUN and creatinine have risen today and patient euvolemic on exam, will stop lasix.  Given significant co-morbidities, need for rate control with limited options and lower BP, would not aggressively initiate further HF GDMT at this time.  Defer management of abx/steroids to pulm/primary team.   For questions or updates, please contact [facility] Please consult [email or web address] for contact info under        Signed, Perlie Gold, PA-C

## 2023-04-28 NOTE — Progress Notes (Signed)
Progress Note   Patient: Vincent Velez WUJ:[id number] DOB: March 09, 1941 DOA: 04/26/2023     1 DOS: the patient was seen and examined on 04/28/2023   Brief hospital course: 82 year old with a history of HTN, HLD, DM2, GERD, PAD, COPD, PAF, glaucoma, AAA, AOS status post TAVR, and lung cancer currently on immunotherapy who presented to the ER 11/25 with a 3-week history of steadily worsening shortness of breath with the acute onset of palpitations and blood-tinged sputum 3 days prior.  He described his hemoptysis as blood-tinged mucus but no clots.  He is currently on Tagrisso for his recurrent lung cancer.  In the ER he was found to have a heart rate of 120-130.  Respiratory panel was negative for COVID and RSV.  CXR noted extensive progressive reticulation and interstitial prominence consistent with progressive ILD.  CTa chest was negative for PE but showed reticular airspace disease consistent with pneumonia versus ILD with increasing lymphadenopathy.   Assessment and Plan: Acute hypoxic respiratory failure -pneumonia versus progressive ILD versus drug-induced pneumonitis Evaluated by Pulmonary -patient confirms DNR/DNI status -required 2 L nasal cannula oxygen support initially but during course of admission requirement increased rapidly up to 8 L  -continue empiric antibiotic therapy as well as steroids, abx broadened today -hold further lasix given rising Cr -Pulmonary is following.  -Palliative Care following   Chronic atrial fibrillation with acute RVR -had been on dixoxin  -this afternoon, continued with RVR. Discussed with Cardiology who has recommended transitioning to amiodarone gtt   Recurrent lung cancer Initially diagnosed 2020 status post resection -recurrence 2022 status post palliative chemotherapy followed by Keytruda then Tarceva and currently on Tagrisso -Tagrisso on hold due to concern for possible pneumonitis -continues on increased O2 requirements    Thrombocytopenia Appears to be a chronic issue with platelet count 38-46 over the last month -felt to be related to his Tagrisso and was being monitored by his oncologist   DM2 CBG reasonably controlled  -monitor w/ use of steroid as further titration of insulin likely to be required    PAD Continue simvastatin  -continuing to hold Eliquis due to low-grade hemoptysis and thrombocytopenia   HTN Blood pressures currently controlled   HLD Continue usual home medical therapy   AoS status post TAVR No evidence of valve dysfunction at present   AAA      Subjective: Continues with shortness of breath this AM  Physical Exam: Vitals:   04/28/23 1143 04/28/23 1145 04/28/23 1500 04/28/23 1617  BP:  101/81 (!) 124/97 (!) 122/95  Pulse: (!) 134 (!) 137 (!) 151 (!) 129  Resp: (!) 26 (!) 28 (!) 28 (!) 24  Temp:  97.8 F (36.6 C)  97.8 F (36.6 C)  TempSrc:  Oral  Oral  SpO2: (!) 88% (!) 89% 95% 96%  Weight:      Height:       General exam: Awake, laying in bed, in nad Respiratory system: Normal respiratory effort, no wheezing Cardiovascular system: tachycardic, s1, s2 Gastrointestinal system: Soft, nondistended, positive BS Central nervous system: CN2-12 grossly intact, strength intact Extremities: Perfused, no clubbing Skin: Normal skin turgor, no notable skin lesions seen Psychiatry: Mood normal // no visual hallucinations   Data Reviewed:  Labs reviewed: Na 136, K 4.0, Cr 1.23, WBC 17.8, Hgb 11.8, Plts 36  Family Communication: Pt in room, family at bedside  Disposition: Status is: Inpatient Remains inpatient appropriate because: Severity of illness  Planned Discharge Destination:  Unclear at this time  Author: Rickey Barbara, MD 04/28/2023 5:09 PM  For on call review [email or web address].

## 2023-04-28 NOTE — Congregational Nurse Program (Addendum)
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 SP wedge resection LUL. With disease recurrence and September 2022 with metastatic mediastinal lymphadenopathy as well as left hilar and left pleural metastatic disease . He was maintained on Tarceva for 20 months  and was placed on Tagrisso 80 mg p.o. daily on ~03/05/23 He has had a weight loss of about 35 pounds . Additional health history includes COPD, Progressive ILD, and atrial fibrillation on Eliquis.  Pt. Presented to the ED 11/25 with hypoxic respiratory failure with hemoptysis. He states he has had shortness of breath for about 3 weeks , and then 11/24 he started coughing up thick blood tinges mucus. CXR was + for progressive ILD and inflammation , vs atypical infection vs volume overload. He was treated with oxygen, antibiotics in the ED. [facility] will be admitting. PCCM was consulted for a Pulmonary consult.   Pertinent  Medical History   Past Medical History:  Diagnosis Date   AAA (abdominal aortic aneurysm) (HCC)    Anxiety    Arthritis    Asthma    when younger   Atrial fibrillation (HCC)    CAD (coronary artery disease)    Cataract    removed both   COPD (chronic obstructive pulmonary disease) (HCC)    Diabetes mellitus    Type II   Dysrhythmia    afib   Esophageal reflux    Family history of malignant neoplasm of gastrointestinal tract    Heart murmur    Hiatal hernia    Hyperlipemia    Hypertension    Lung nodule    a. PET scan highly suspicious for malignancy. Bronch with biopsy to be done after TAVR   Malignant neoplasm of prostate [facility])    prostate    Peripheral vascular disease (HCC)    Pneumonia    Stricture and stenosis of  esophagus      Significant Hospital Events: Including procedures, antibiotic start and stop dates in addition to other pertinent events   04/26/2023 Admission to [facility] for acute respiratory Failure  Interim History / Subjective:  Afib/RVR Not much progress on respiratory status, remains tenuous.  Objective   Blood pressure (!) 120/100, pulse (!) 130, temperature 97.7 F (36.5 C), temperature source Oral, resp. rate (!) 22, height 5\' 9"  (1.753 m), weight 61.2 kg, SpO2 94%.    Vent Mode: BIPAP;PCV FiO2 (%):  [100 %] 100 % Set Rate:  [8 bmp] 8 bmp PEEP:  [6 cmH20] 6 cmH20   Intake/Output Summary (Last 24 hours) at 04/28/2023 0947 Last data filed at 04/28/2023 0753 Gross per 24 hour  Intake 1390 ml  Output 3400 ml  Net -2010 ml   Filed Weights   04/26/23 1246 04/28/23 0852  Weight: 65.8 kg 61.2 kg    Examination: Chronically ill appearing No edema Moves to command Weak Breath sounds minimal crackles bases, occasional accessory muscle use Sats low 90s 15L salter Heart sounds irregular tachy  Resolved Hospital Problem list     Assessment & Plan:  ARDS- see discussion 11/25.  He is getting antibiotics, steroids, and diuresis without improvement so far.  Going to broaden to vanc/zosyn/doxycycline as nothing really else to offer.  Discussed with RT and we will try  HHFNC.  RN to grab RVP as it was not collected yesterday.  He may be nearing EOL.  PMT to see, appreciate help.    Myrla Halsted MD PCCM

## 2023-04-28 NOTE — Progress Notes (Signed)
Initial Nutrition Assessment  DOCUMENTATION CODES:   Underweight, Severe malnutrition in context of chronic illness  INTERVENTION:   -Recommend dysphagia III diet with extra gravies for ease of chewing. Would keep diet liberalized, continue medically management of BG.  -Increase Ensure Enlive po to  TID, each supplement provides 350 kcal and 20 grams of protein.  -MVI/Minerals-1 Tab daily for micronutrient support due to prolonged suboptimal intakes.  -Provide adequate oral care.  -Consider appetite stimulant.    NUTRITION DIAGNOSIS:   Severe Malnutrition related to chronic illness, cancer and cancer related treatments, poor appetite as evidenced by severe muscle depletion, moderate muscle depletion, moderate fat depletion, mild fat depletion, per patient/family report, percent weight loss, energy intake < or equal to 75% for > or equal to 1 month.   GOAL:   Patient will meet greater than or equal to 90% of their needs   MONITOR:   PO intake, Supplement acceptance, Skin, Labs, Weight trends  REASON FOR ASSESSMENT:   Malnutrition Screening Tool -reported unintentional weight loss and decreased dietary intakes.    ASSESSMENT: 82 y/o male presented with shortness of breath, hemoptysis, palpitations. PMH: HTN, HLD, DM2, GERD, PAD, COPD, A-fib, anxiety, glaucoma, AAA, aortic stenosis S/P TAVR, lung CA on immunotherapy, CAD, esophageal reflux, hiatal hernia, malignant neoplasm of prostate, PVD, pneumonia, stricture and stenosis of esophagus, abdominal aorta stent, lung resection-wedge.  Intakes recorded 90-100% x 2 meals. Patient has been taking the Ensure, prefers chocolate. Nutrition provided by daughter at bedside: Patient has been consuming 75% of usual intakes the past month and about 50% of usual the past two weeks due to lack of appetite, taste alterations, dry mouth. Poor dentition noted, agrees to mechanical soft for ease of chewing. Usual weight 242# which he weight  about two months ago. Per EMR, weight loss of 9% within 1.25 months using weight from 03/17/23 of 67.4 kg.   Medications reviewed and include novolog SS 3x daily with meals and at bedtime, insulin glargine-yfgn 14 units at bedtime, solumedrol, vancomycin, zosyn.  Labs: CBG 173-356 past 24 hrs, hgb A1C 6.6% (01/25/23)     NUTRITION - FOCUSED PHYSICAL EXAM:  Flowsheet Row Most Recent Value  Orbital Region Moderate depletion  Upper Arm Region Mild depletion  Thoracic and Lumbar Region Mild depletion  Buccal Region Mild depletion  Temple Region Moderate depletion  Clavicle Bone Region Severe depletion  Clavicle and Acromion Bone Region Severe depletion  Scapular Bone Region Moderate depletion  Dorsal Hand Mild depletion  Patellar Region Mild depletion  Anterior Thigh Region Moderate depletion  Posterior Calf Region Moderate depletion  Edema (RD Assessment) None  Hair Reviewed  Eyes Reviewed  Mouth Reviewed  Skin Reviewed  Nails Reviewed       Diet Order:   Diet Order             DIET DYS 3 Room service appropriate? Yes; Fluid consistency: Thin  Diet effective now                   EDUCATION NEEDS:   Not appropriate for education at this time  Skin:  Skin Assessment: Skin Integrity Issues: Skin Integrity Issues:: Other (Comment) Other: erythema bilateral buttock  Last BM:  04/27/23, type 2-large  Height:   Ht Readings from Last 1 Encounters:  04/26/23 5\' 9"  (1.753 m)    Weight:   Wt Readings from Last 1 Encounters:  04/28/23 61.2 kg    Ideal Body Weight:  72.7 kg  BMI:  Body mass  index is 19.92 kg/m.  Estimated Nutritional Needs:   Kcal:  2000-2300 kcal/day  Protein:  95-109 gm/day  Fluid:  2000-2300 mL/day    Alvino Chapel, RDLD Clinical Dietitian See AMION for contact information

## 2023-04-28 NOTE — Progress Notes (Signed)
Patient sustaining AF/RVR rate in 150s approximately 1.5 hours after receiving digoxin 0.25mg  IV per orders. BP 124/97, SPO2 90% on 15L O2 via high-flow heated nasal canula.  Patient uncomfortable appearing and is reporting mild distress currently; apparently experienced some chest pain "a while ago" but did not notify RN.  No active chest pain at this time.  Notified Perlie Gold, PA-C rounding for cardiology and Dr. Rhona Leavens by secure message.

## 2023-04-29 ENCOUNTER — Inpatient Hospital Stay (HOSPITAL_COMMUNITY): Payer: Medicare Other

## 2023-04-29 DIAGNOSIS — I483 Typical atrial flutter: Secondary | ICD-10-CM | POA: Diagnosis not present

## 2023-04-29 DIAGNOSIS — J9621 Acute and chronic respiratory failure with hypoxia: Secondary | ICD-10-CM | POA: Diagnosis not present

## 2023-04-29 DIAGNOSIS — J189 Pneumonia, unspecified organism: Secondary | ICD-10-CM | POA: Diagnosis not present

## 2023-04-29 DIAGNOSIS — J9601 Acute respiratory failure with hypoxia: Secondary | ICD-10-CM | POA: Diagnosis not present

## 2023-04-29 DIAGNOSIS — E43 Unspecified severe protein-calorie malnutrition: Secondary | ICD-10-CM | POA: Insufficient documentation

## 2023-04-29 DIAGNOSIS — I4892 Unspecified atrial flutter: Secondary | ICD-10-CM | POA: Diagnosis not present

## 2023-04-29 LAB — COMPREHENSIVE METABOLIC PANEL
ALT: 26 U/L (ref 0–44)
AST: 61 U/L — ABNORMAL HIGH (ref 15–41)
Albumin: 2.8 g/dL — ABNORMAL LOW (ref 3.5–5.0)
Alkaline Phosphatase: 167 U/L — ABNORMAL HIGH (ref 38–126)
Anion gap: 11 (ref 5–15)
BUN: 52 mg/dL — ABNORMAL HIGH (ref 8–23)
CO2: 23 mmol/L (ref 22–32)
Calcium: 9.3 mg/dL (ref 8.9–10.3)
Chloride: 99 mmol/L (ref 98–111)
Creatinine, Ser: 1 mg/dL (ref 0.61–1.24)
GFR, Estimated: >60 mL/min (ref >60)
Glucose, Bld: 235 mg/dL — ABNORMAL HIGH (ref 70–99)
Potassium: 3.8 mmol/L (ref 3.5–5.1)
Sodium: 133 mmol/L — ABNORMAL LOW (ref 135–145)
Total Bilirubin: 3 mg/dL — ABNORMAL HIGH (ref <1.2)
Total Protein: 7.5 g/dL (ref 6.5–8.1)

## 2023-04-29 LAB — CBC
HCT: 35.8 % — ABNORMAL LOW (ref 39.0–52.0)
Hemoglobin: 12.1 g/dL — ABNORMAL LOW (ref 13.0–17.0)
MCH: 30.6 pg (ref 26.0–34.0)
MCHC: 33.8 g/dL (ref 30.0–36.0)
MCV: 90.4 fL (ref 80.0–100.0)
Platelets: 14 10*3/uL — CL (ref 150–400)
RBC: 3.96 MIL/uL — ABNORMAL LOW (ref 4.22–5.81)
RDW: 15.9 % — ABNORMAL HIGH (ref 11.5–15.5)
WBC: 16.4 10*3/uL — ABNORMAL HIGH (ref 4.0–10.5)
nRBC: 0.1 % (ref 0.0–0.2)

## 2023-04-29 LAB — GLUCOSE, CAPILLARY
Glucose-Capillary: 434 mg/dL — ABNORMAL HIGH (ref 70–99)
Glucose-Capillary: 367 mg/dL — ABNORMAL HIGH (ref 70–99)

## 2023-04-29 LAB — LEGIONELLA PNEUMOPHILA SEROGP 1 UR AG: L. pneumophila Serogp 1 Ur Ag: NEGATIVE

## 2023-04-29 MED ORDER — LORAZEPAM 2 MG/ML IJ SOLN
0.5000 mg | Freq: Once | INTRAMUSCULAR | Status: AC
  Administered 2023-04-29: 0.5 mg via INTRAVENOUS
  Filled 2023-04-29: qty 1

## 2023-04-29 MED ORDER — MORPHINE SULFATE (PF) 2 MG/ML IV SOLN: 2.0000 mg | INTRAVENOUS | Status: DC | PRN

## 2023-04-29 MED ORDER — LORAZEPAM 2 MG/ML PO CONC: 1.0000 mg | ORAL | Status: DC | PRN

## 2023-04-29 MED ORDER — MORPHINE 100MG IN NS 100ML (1MG/ML) PREMIX INFUSION
1.0000 mg/h | INTRAVENOUS | Status: DC
  Administered 2023-04-29: 2 mg/h via INTRAVENOUS
  Filled 2023-04-29: qty 100

## 2023-04-29 MED ORDER — ALBUTEROL SULFATE (2.5 MG/3ML) 0.083% IN NEBU: 2.5000 mg | INHALATION_SOLUTION | RESPIRATORY_TRACT | Status: DC | PRN

## 2023-04-29 MED ORDER — LORAZEPAM 1 MG PO TABS: 1.0000 mg | ORAL_TABLET | ORAL | Status: DC | PRN

## 2023-04-29 MED ORDER — MORPHINE BOLUS VIA INFUSION: 1.0000 mg | INTRAVENOUS | Status: DC | PRN

## 2023-04-29 MED ORDER — MORPHINE SULFATE (PF) 2 MG/ML IV SOLN
2.0000 mg | Freq: Once | INTRAVENOUS | Status: AC
  Administered 2023-04-29: 2 mg via INTRAVENOUS
  Filled 2023-04-29: qty 1

## 2023-04-29 MED ORDER — LORAZEPAM 2 MG/ML IJ SOLN
1.0000 mg | INTRAMUSCULAR | Status: DC | PRN
  Administered 2023-04-29: 1 mg via INTRAVENOUS
  Filled 2023-04-29: qty 1

## 2023-04-30 ENCOUNTER — Ambulatory Visit (HOSPITAL_COMMUNITY): Payer: Medicare Other

## 2023-04-30 ENCOUNTER — Ambulatory Visit (HOSPITAL_BASED_OUTPATIENT_CLINIC_OR_DEPARTMENT_OTHER): Payer: Medicare Other

## 2023-04-30 NOTE — Discharge Summary (Signed)
Death Summary  Vincent Velez:[id number] DOB: [birth year] DOA: 05-21-2023  PCP: [name], MD  Admit date: 05-21-2023 Date of Death: 25-May-2023 Time of Death: 06/03/11 Notification: [name], MD notified of death of May 25, 2023   History of present illness:  82 year old with a history of HTN, HLD, DM2, GERD, PAD, COPD, PAF, glaucoma, AAA, AOS status post TAVR, and lung cancer currently on immunotherapy who presented to the ER 05/21/2023 with a 3-week history of steadily worsening shortness of breath with the acute onset of palpitations and blood-tinged sputum 3 days prior. He described his hemoptysis as blood-tinged mucus but no clots. Hewas on Tagrisso for his recurrent lung cancer. In the ER he was found to have a heart rate of 120-130. Respiratory panel was negative for COVID and RSV. CXR noted extensive progressive reticulation and interstitial prominence consistent with progressive ILD. CTa chest was negative for PE but showed reticular airspace disease consistent with pneumonia versus ILD with increasing lymphadenopathy.   Final Diagnoses:  Acute hypoxic respiratory failure -pneumonia versus progressive ILD versus drug-induced pneumonitis Pt had been seen by Pulmonary -initially required 2 L nasal cannula oxygen, however during hospital course, O2 requirements increased rapidly up to 8 L  -despite empiric antibiotic therapy and steroids, patient's O2 requirements and respiratory status continued to worsen and ultimately required bipap -Family at bedside affirmed pt's wishes for transition to comfort care status. Prognosis was later determined to be terminal -Morphine gtt was started -on 05-25-2023 at 1912, patient was pronounced   Chronic atrial fibrillation with acute RVR -had been on dixoxin  -Pt later required amiodarone gtt per Cardiology recs -Later transitioned to comfort care   Recurrent lung cancer Initially diagnosed Jun 03, 2019 status post resection -recurrence Jun 02, 2021 status  post palliative chemotherapy followed by Keytruda then Tarceva and currently on Tagrisso -Tagrisso on hold due to concern for possible pneumonitis   Thrombocytopenia Appears to be a chronic issue with platelet count 38-46 over the last month -felt to be related to his Tagrisso and was being monitored by his oncologist -Later transitioned to comfort measures   DM2 -Later transitioned to comfort measures only   PAD Was continued on simvastatin  -later transitioned to comfort measures   HTN   HLD   AoS status post TAVR   AAA   DNR, End of Life Care     Significant Diagnostic Studies: DG Chest Port 1 View  Result Date: 04/18/2023 CLINICAL DATA:  82 year old male with history of hypoxia.  ARDS. EXAM: PORTABLE CHEST 1 VIEW COMPARISON:  Chest x-ray 05/21/23. FINDINGS: Markedly worsened aeration in the lungs bilaterally, suggesting progressive multilobar bilateral pneumonia. Lung volumes remain low with severe chronic elevation of the right hemidiaphragm. No definite pneumothorax. Pulmonary vasculature is obscured, as are cardiomediastinal structures. Status post TAVR. IMPRESSION: 1. Worsening severe multilobar bilateral pneumonia. Electronically Signed   By: Trudie Reed M.D.   On: 04/21/2023 09:25   ECHOCARDIOGRAM COMPLETE  Result Date: 04/27/2023    ECHOCARDIOGRAM REPORT   Patient Name:   Vincent Velez Date of Exam: 04/27/2023 Medical Rec #:  [id number]        Height:       69.0 in Accession #:    [id number]       Weight:       145.0 lb Date of Birth:  1940/08/28        BSA:          1.802 m Patient Age:    53 years  BP:           106/74 mmHg Patient Gender: M                HR:           110 bpm. Exam Location:  Inpatient Procedure: 2D Echo, Cardiac Doppler, Color Doppler and Strain Analysis Indications:    Congestive Heart Failure  History:        Patient has prior history of Echocardiogram examinations, most                 recent 07/17/2020. Arrythmias:Atrial  Fibrillation; Risk                 Factors:Hypertension, Dyslipidemia, Diabetes and Former Smoker.                 Aortic Valve: 26 mm Sapien prosthetic, stented (TAVR) valve is                 present in the aortic position.  Sonographer:    Karma Ganja Referring Phys: [id number] Cecille Po MELVIN  Sonographer Comments: Technically difficult study due to poor echo windows. Image acquisition challenging due to respiratory motion. IMPRESSIONS  1. Left ventricular ejection fraction, by estimation, is 35 to 40%. The left ventricle has moderately decreased function. The left ventricle demonstrates global hypokinesis. There is mild concentric left ventricular hypertrophy. Left ventricular diastolic parameters are indeterminate.  2. Right ventricular systolic function is normal. The right ventricular size is mildly enlarged. There is normal pulmonary artery systolic pressure. The estimated right ventricular systolic pressure is 34.4 mmHg.  3. Left atrial size was severely dilated.  4. Right atrial size was mildly dilated.  5. The mitral valve is normal in structure. Mild mitral valve regurgitation. No evidence of mitral stenosis.  6. Bioprosthetic aortic valve s/p TAVR. 26 mm Edwards Sapien THV. There was mild-moderate peri-valvular leakage. Mean gradient across valve elevated at 19 mmHg with dimensionless index 0.25.  7. The inferior vena cava is normal in size with greater than 50% respiratory variability, suggesting right atrial pressure of 3 mmHg.  8. The patient was in atrial fibrillation with RVR. FINDINGS  Left Ventricle: Left ventricular ejection fraction, by estimation, is 35 to 40%. The left ventricle has moderately decreased function. The left ventricle demonstrates global hypokinesis. Definity contrast agent was given IV to delineate the left ventricular endocardial borders. The left ventricular internal cavity size was normal in size. There is mild concentric left ventricular hypertrophy. Left ventricular  diastolic parameters are indeterminate. Right Ventricle: The right ventricular size is mildly enlarged. No increase in right ventricular wall thickness. Right ventricular systolic function is normal. There is normal pulmonary artery systolic pressure. The tricuspid regurgitant velocity is 2.80  m/s, and with an assumed right atrial pressure of 3 mmHg, the estimated right ventricular systolic pressure is 34.4 mmHg. Left Atrium: Left atrial size was severely dilated. Right Atrium: Right atrial size was mildly dilated. Pericardium: There is no evidence of pericardial effusion. Mitral Valve: The mitral valve is normal in structure. Mild mitral annular calcification. Mild mitral valve regurgitation. No evidence of mitral valve stenosis. Tricuspid Valve: The tricuspid valve is normal in structure. Tricuspid valve regurgitation is trivial. Aortic Valve: Bioprosthetic aortic valve s/p TAVR. 26 mm Edwards Sapien THV. There was mild-moderate peri-valvular leakage. Mean gradient across valve elevated at 19 mmHg with dimensionless index 0.25. The aortic valve has been repaired/replaced. Aortic valve regurgitation is mild to moderate. Aortic regurgitation PHT measures 170 msec. Aortic  valve mean gradient measures 19.4 mmHg. Aortic valve peak gradient measures 32.9 mmHg. Aortic valve area, by VTI measures 1.33 cm. There is a 26 mm Sapien prosthetic, stented (TAVR) valve present in the aortic position. Pulmonic Valve: The pulmonic valve was normal in structure. Pulmonic valve regurgitation is not visualized. Aorta: The aortic root is normal in size and structure. Venous: The inferior vena cava is normal in size with greater than 50% respiratory variability, suggesting right atrial pressure of 3 mmHg. IAS/Shunts: No atrial level shunt detected by color flow Doppler.  LEFT VENTRICLE PLAX 2D LVIDd:         5.40 cm      Diastology LVIDs:         4.60 cm      LV e' medial:    9.28 cm/s LV PW:         1.10 cm      LV E/e' medial:  9.4  LV IVS:        1.10 cm      LV e' lateral:   8.07 cm/s LVOT diam:     2.60 cm      LV E/e' lateral: 10.8 LV SV:         60 LV SV Index:   33 LVOT Area:     5.31 cm  LV Volumes (MOD) LV vol d, MOD A2C: 127.0 ml LV vol d, MOD A4C: 112.0 ml LV vol s, MOD A2C: 86.3 ml LV vol s, MOD A4C: 77.4 ml LV SV MOD A2C:     40.7 ml LV SV MOD A4C:     112.0 ml LV SV MOD BP:      40.3 ml RIGHT VENTRICLE             IVC RV Basal diam:  4.60 cm     IVC diam: 1.60 cm RV S prime:     20.40 cm/s TAPSE (M-mode): 2.4 cm LEFT ATRIUM              Index        RIGHT ATRIUM           Index LA diam:        4.20 cm  2.33 cm/m   RA Area:     14.70 cm LA Vol (A2C):   143.0 ml 79.35 ml/m  RA Volume:   32.00 ml  17.76 ml/m LA Vol (A4C):   66.5 ml  36.90 ml/m LA Biplane Vol: 99.1 ml  54.99 ml/m  AORTIC VALVE AV Area (Vmax):    1.24 cm AV Area (Vmean):   1.20 cm AV Area (VTI):     1.33 cm AV Vmax:           286.60 cm/s AV Vmean:          205.200 cm/s AV VTI:            0.450 m AV Peak Grad:      32.9 mmHg AV Mean Grad:      19.4 mmHg LVOT Vmax:         67.20 cm/s LVOT Vmean:        46.433 cm/s LVOT VTI:          0.113 m LVOT/AV VTI ratio: 0.25 AI PHT:            170 msec  AORTA Ao Root diam: 3.00 cm Ao Asc diam:  2.80 cm MITRAL VALVE  TRICUSPID VALVE MV Area (PHT): 6.67 cm    TR Peak grad:   31.4 mmHg MV Decel Time: 114 msec    TR Vmax:        280.00 cm/s MV E velocity: 86.86 cm/s                            SHUNTS                            Systemic VTI:  0.11 m                            Systemic Diam: 2.60 cm Dalton McleanMD Electronically signed by Wilfred Lacy Signature Date/Time: 04/27/2023/1:54:13 PM    Final    CT Angio Chest PE W and/or Wo Contrast  Result Date: 04/26/2023 CLINICAL DATA:  Hemoptysis.  History of lung cancer. EXAM: CT ANGIOGRAPHY CHEST WITH CONTRAST TECHNIQUE: Multidetector CT imaging of the chest was performed using the standard protocol during bolus administration of intravenous contrast.  Multiplanar CT image reconstructions and MIPs were obtained to evaluate the vascular anatomy. RADIATION DOSE REDUCTION: This exam was performed according to the departmental dose-optimization program which includes automated exposure control, adjustment of the mA and/or kV according to patient size and/or use of iterative reconstruction technique. CONTRAST:  75mL OMNIPAQUE IOHEXOL 350 MG/ML SOLN COMPARISON:  February 26, 2023.  January 20, 2023. FINDINGS: Cardiovascular: Satisfactory opacification of the pulmonary arteries to the segmental level. No evidence of pulmonary embolism. Mild cardiomegaly. Status post transcatheter aortic valve repair. Coronary artery calcifications are noted. No pericardial effusion. Mediastinum/Nodes: Esophagus is unremarkable. Thyroid gland is unremarkable. 17 mm right hilar lymph node is noted concerning for malignancy. 16 mm pretracheal lymph node is noted concerning for malignancy. 13 mm subcarinal lymph node is noted consistent with malignancy. Lungs/Pleura: No pneumothorax is noted. Interval development of patchy airspace opacities throughout both lungs most consistent with multifocal pneumonia. Minimal left pleural effusion is noted. Upper Abdomen: No acute abnormality. Musculoskeletal: No chest wall abnormality. No acute or significant osseous findings. Review of the MIP images confirms the above findings. IMPRESSION: No definite evidence of pulmonary embolus. Interval development of diffuse patchy airspace opacities throughout both lungs most consistent with multifocal pneumonia. Minimal left pleural effusion. Coronary artery calcifications are noted suggesting coronary artery disease. Mildly enlarged mediastinal lymph nodes are noted concerning for metastatic disease given the history of lung cancer. Aortic Atherosclerosis (ICD10-I70.0). Electronically Signed   By: Lupita Raider M.D.   On: 04/26/2023 16:44   DG Chest Portable 1 View  Result Date: 04/26/2023 CLINICAL  DATA:  Hemoptysis. History of lung cancer and atrial fibrillation. EXAM: PORTABLE CHEST 1 VIEW COMPARISON:  CT 01/20/2023. PET-CT 02/26/2023. Radiographs 03/30/2021 and 03/05/2021. FINDINGS: 1327 hours. Low lung volumes. The heart size and mediastinal contours are grossly stable status post TAVR procedure. There is extensive subpleural reticulation and interstitial prominence in both lungs which has significantly progressed from the 2022 radiographs. Pulmonary findings also appear progressive compared with the chest CT of 3 months ago but similar to the more recent PET-CT of 2 months ago. No confluent airspace disease, large pleural effusion or pneumothorax identified. The bones appear unchanged. Telemetry leads overlie the chest. IMPRESSION: Extensive subpleural reticulation and interstitial prominence in both lungs, progressive compared with the chest CT of 3 months ago. 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 Differential includes atypical infection and lymphangitic tumor based on prior cross-sectional imaging. Continued follow-up recommended. Electronically Signed   By: Carey Bullocks M.D.   On: 04/26/2023 14:19    Microbiology: Recent Results (from the past 240 hour(s))  Blood culture (routine x 2)     Status: None (Preliminary result)   Collection Time: 04/26/23  1:49 PM   Specimen: BLOOD LEFT ARM  Result Value Ref Range Status   Specimen Description BLOOD LEFT ARM  Final   Special Requests   Final    BOTTLES DRAWN AEROBIC AND ANAEROBIC Blood Culture adequate volume   Culture   Final    NO GROWTH 4 DAYS Performed at [facility], [address], [place], [place] [address]    Report Status PENDING  Incomplete  Blood culture (routine x 2)     Status: None (Preliminary result)   Collection Time: 04/26/23  2:02 PM   Specimen: BLOOD  Result Value Ref Range Status   Specimen Description BLOOD RIGHT  ANTECUBITAL  Final   Special Requests   Final    BOTTLES DRAWN AEROBIC AND ANAEROBIC Blood Culture adequate volume   Culture   Final    NO GROWTH 4 DAYS Performed at [facility], [address] [place], [place] [address]    Report Status PENDING  Incomplete  Resp panel by RT-PCR (RSV, Flu A&B, Covid) Anterior Nasal Swab     Status: None   Collection Time: 04/26/23  2:02 PM   Specimen: Anterior Nasal Swab  Result Value Ref Range Status   SARS Coronavirus 2 by RT PCR NEGATIVE NEGATIVE Final   Influenza A by PCR NEGATIVE NEGATIVE Final   Influenza B by PCR NEGATIVE NEGATIVE Final    Comment: (NOTE) The Xpert Xpress SARS-CoV-2/FLU/RSV plus assay is intended as an aid in the diagnosis of influenza from Nasopharyngeal swab specimens and should not be used as a sole basis for treatment. Nasal washings and aspirates are unacceptable for Xpert Xpress SARS-CoV-2/FLU/RSV testing.  Fact Sheet for Patients: BloggerCourse.com  Fact Sheet for Healthcare Providers: SeriousBroker.it  This test is not yet approved or cleared by the [place] FDA and has been authorized for detection and/or diagnosis of SARS-CoV-2 by FDA under an Emergency Use Authorization (EUA). This EUA will remain in effect (meaning this test can be used) for the duration of the COVID-19 declaration under Section 564(b)(1) of the Act, 21 U.S.C. section 360bbb-3(b)(1), unless the authorization is terminated or revoked.     Resp Syncytial Virus by PCR NEGATIVE NEGATIVE Final    Comment: (NOTE) Fact Sheet for Patients: BloggerCourse.com  Fact Sheet for Healthcare Providers: SeriousBroker.it  This test is not yet approved or cleared by the [place] FDA and has been authorized for detection and/or diagnosis of SARS-CoV-2 by FDA under an Emergency Use Authorization (EUA). This EUA will remain in effect  (meaning this test can be used) for the duration of the COVID-19 declaration under Section 564(b)(1) of the Act, 21 U.S.C. section 360bbb-3(b)(1), unless the authorization is terminated or revoked.  Performed at [facility], [address] [place], [place] [address]   Respiratory (~20 pathogens) panel by PCR     Status: None   Collection Time: 04/28/23 11:32 AM   Specimen: Nasopharyngeal Swab; Respiratory  Result Value Ref Range Status   Adenovirus NOT DETECTED NOT DETECTED Final   Coronavirus 229E NOT DETECTED NOT DETECTED Final  Comment: (NOTE) The Coronavirus on the Respiratory Panel, DOES NOT test for the novel  Coronavirus (2019 nCoV)    Coronavirus HKU1 NOT DETECTED NOT DETECTED Final   Coronavirus NL63 NOT DETECTED NOT DETECTED Final   Coronavirus OC43 NOT DETECTED NOT DETECTED Final   Metapneumovirus NOT DETECTED NOT DETECTED Final   Rhinovirus / Enterovirus NOT DETECTED NOT DETECTED Final   Influenza A NOT DETECTED NOT DETECTED Final   Influenza B NOT DETECTED NOT DETECTED Final   Parainfluenza Virus 1 NOT DETECTED NOT DETECTED Final   Parainfluenza Virus 2 NOT DETECTED NOT DETECTED Final   Parainfluenza Virus 3 NOT DETECTED NOT DETECTED Final   Parainfluenza Virus 4 NOT DETECTED NOT DETECTED Final   Respiratory Syncytial Virus NOT DETECTED NOT DETECTED Final   Bordetella pertussis NOT DETECTED NOT DETECTED Final   Bordetella Parapertussis NOT DETECTED NOT DETECTED Final   Chlamydophila pneumoniae NOT DETECTED NOT DETECTED Final   Mycoplasma pneumoniae NOT DETECTED NOT DETECTED Final    Comment: Performed at [facility], [address], [place], [place] [address]     Labs: Basic Metabolic Panel: Recent Labs  Lab 04/26/23 1255 04/26/23 1402 04/27/23 0247 04/28/23 0344 04/07/2023 0334  NA 132*  --  135 136 133*  K 3.8  --  3.9 4.0 3.8  CL 102  --  105 102 99  CO2 20*  --  19* 21* 23  GLUCOSE 130*  --  205* 308* 235*  BUN 24*  --  27* 44* 52*   CREATININE 0.92  --  0.96 1.23 1.00  CALCIUM 9.2  --  9.2 9.7 9.3  MG  --  2.1  --  2.4  --    Liver Function Tests: Recent Labs  Lab 04/26/23 1255 04/27/23 0247 04/28/23 0344 05/01/2023 0334  AST 37 38 44* 61*  ALT 17 17 20 26   ALKPHOS 84 84 118 167*  BILITOT 1.5* 0.9 0.9 3.0*  PROT 7.4 7.1 7.5 7.5  ALBUMIN 3.1* 2.8* 3.0* 2.8*   No results for input(s): "LIPASE", "AMYLASE" in the last 168 hours. No results for input(s): "AMMONIA" in the last 168 hours. CBC: Recent Labs  Lab 04/26/23 1255 04/27/23 0247 04/28/23 0344 04/21/2023 0334  WBC 11.8*  12.1* 8.7 17.8* 16.4*  NEUTROABS 10.1*  --   --   --   HGB 12.0*  11.8* 11.4* 11.8* 12.1*  HCT 36.9*  36.3* 35.5* 36.3* 35.8*  MCV 94.4  93.1 93.2 91.4 90.4  PLT 41*  42* 43* 36* 14*   Cardiac Enzymes: No results for input(s): "CKTOTAL", "CKMB", "CKMBINDEX", "TROPONINI" in the last 168 hours. D-Dimer No results for input(s): "DDIMER" in the last 72 hours. BNP: Invalid input(s): "POCBNP" CBG: Recent Labs  Lab 04/28/23 1143 04/28/23 1615 04/28/23 2059 04/10/2023 0634 04/04/2023 0751  GLUCAP 259* 360* 285* 434* 367*   Anemia work up No results for input(s): "VITAMINB12", "FOLATE", "FERRITIN", "TIBC", "IRON", "RETICCTPCT" in the last 72 hours. Urinalysis    Component Value Date/Time   COLORURINE YELLOW 04/27/2023 0352   APPEARANCEUR CLEAR 04/27/2023 0352   LABSPEC 1.029 04/27/2023 0352   PHURINE 5.0 04/27/2023 0352   GLUCOSEU >=500 (A) 04/27/2023 0352   HGBUR NEGATIVE 04/27/2023 0352   BILIRUBINUR NEGATIVE 04/27/2023 0352   KETONESUR NEGATIVE 04/27/2023 0352   PROTEINUR NEGATIVE 04/27/2023 0352   UROBILINOGEN 1.0 04/11/2009 0819   NITRITE NEGATIVE 04/27/2023 0352   LEUKOCYTESUR NEGATIVE 04/27/2023 0352   Sepsis Labs Recent Labs  Lab 04/26/23 1255 04/27/23 0247  04/28/23 0344 05/01/2023 0334  WBC 11.8*  12.1* 8.7 17.8* 16.4*      SIGNED:  Rickey Barbara, MD  [facility] 04/23/2023, 4:46 PM  If  7PM-7AM, please contact night-coverage [email or web address] Password [email or web address]

## 2023-05-01 LAB — CULTURE, BLOOD (ROUTINE X 2)
Culture: NO GROWTH
Culture: NO GROWTH
Special Requests: ADEQUATE
Special Requests: ADEQUATE

## 2023-05-02 NOTE — Progress Notes (Addendum)
  Progress Note   Patient: Vincent Velez EAV:[id number] DOB: Feb 09, 1941 DOA: 04/26/2023     2 DOS: the patient was seen and examined on 04/18/2023   Brief hospital course: 82 year old with a history of HTN, HLD, DM2, GERD, PAD, COPD, PAF, glaucoma, AAA, AOS status post TAVR, and lung cancer currently on immunotherapy who presented to the ER 11/25 with a 3-week history of steadily worsening shortness of breath with the acute onset of palpitations and blood-tinged sputum 3 days prior.  He described his hemoptysis as blood-tinged mucus but no clots.  He is currently on Tagrisso for his recurrent lung cancer.  In the ER he was found to have a heart rate of 120-130.  Respiratory panel was negative for COVID and RSV.  CXR noted extensive progressive reticulation and interstitial prominence consistent with progressive ILD.  CTa chest was negative for PE but showed reticular airspace disease consistent with pneumonia versus ILD with increasing lymphadenopathy.   Assessment and Plan: Acute hypoxic respiratory failure -pneumonia versus progressive ILD versus drug-induced pneumonitis Pt had been seen by Pulmonary -initially required 2 L nasal cannula oxygen, however during hospital course, O2 requirements increased rapidly up to 8 L  -despite empiric antibiotic therapy and steroids, patient's O2 requirements and respiratory status continued to worsen and ultimately required bipap -Family at bedside affirms pt's wishes for transition to comfort care status. Prognosis is now terminal -Morphine gtt was started  Chronic atrial fibrillation with acute RVR -had been on dixoxin  -Pt later required amiodarone gtt per Cardiology recs -Later transitioned to comfort care   Recurrent lung cancer Initially diagnosed 2020 status post resection -recurrence 2022 status post palliative chemotherapy followed by Keytruda then Tarceva and currently on Tagrisso -Tagrisso on hold due to concern for possible pneumonitis    Thrombocytopenia Appears to be a chronic issue with platelet count 38-46 over the last month -felt to be related to his Tagrisso and was being monitored by his oncologist -Later transitioned to comfort measures   DM2 -Later transitioned to comfort measures only   PAD Was continued on simvastatin  -later transitioned to comfort measures   HTN  HLD   AoS status post TAVR   AAA  DNR, End of Life Care      Subjective: Unable to assess this AM given mentation. Pt currently with bipap  Physical Exam: Vitals:   04/04/2023 0546 04/03/2023 0632 04/11/2023 0649 04/10/2023 0930  BP: (!) 136/97 (!) 126/99 (!) 121/99 123/81  Pulse: (!) 132 (!) 131 67   Resp: (!) 34 (!) 31 (!) 22   Temp:  (!) 97.4 F (36.3 C)    TempSrc:  Axillary    SpO2: (!) 86% (!) 81% 94%   Weight:      Height:       General exam: Laying in bed with bipap, head normocephalic  Respiratory system: bipap in place, increased resp effort Cardiovascular system: tachycardic, s1-s2 Gastrointestinal system: Nondistended Central nervous system: No seizures, no tremors Extremities: No cyanosis, no joint deformities Skin: No rashes, no pallor Psychiatry: unable to assess given mentation  Data Reviewed:  Labs reviewed: Na 133, K 3.8, Cr 1.00, WBC 16.4, Hgb 12.1, Plts 14k  Family Communication: Pt in room, family at bedside  Disposition: Status is: Inpatient Remains inpatient appropriate because: Severity of illness  Planned Discharge Destination:  Anticipate hospital death     Author: Rickey Barbara, MD 04/02/2023 5:28 PM  For on call review [email or web address].

## 2023-05-02 NOTE — Progress Notes (Addendum)
[facility] night cross cover note:   I was notified by RN regarding request for something for anxiety. I subsequently placed order for ativan 0.5 mg iv x 1 dose.   Subsequently, I also ordered an additional dose of morphine 2 mg iv x 1 for air hunger.     Update at 0645 AM: Very SOB, anxious. Initially unable to tolerate bipap, breathing over the bipap, pulling off the mask prior to the above 2 mg of iv morphine. Subsequently, work of breathing has improved some with the 2 mg of iv morphine (noting that prior dose of 1 mg of iv morphine was ineffective to this end). Now tolerating bipap mask.   RN has updated patient's daughter, who is now en route to the hospital. She did not feel comfortable transitioning the patient to comfort care measures over the phone, but will further discuss the above updates, goals of care, and potential transition to comfort care upon her arrival at [facility].   Until then, in order to assist with air hunger and to help patient tolerate these symptoms as well as the current bipap mask, I have updated his existing morphine order to morphine 2 mg IV every 30 minutes as needed IV and conveyed this to patient's RN.   CBG this AM also noted to be 434. He is receiving 15 units of Novolog per sliding scale coverage at this time.     Newton Pigg, DO Hospitalist

## 2023-05-02 NOTE — Progress Notes (Signed)
Noted events overnight, patient transitioned to comfort care. PCCM will sign off. Thank you for the opportunity to participate in this patient's care.   Lindsee Labarre D. Harris, NP-C [facility] Personal contact information can be found on Amion  If no contact or response made please call [phone] 04/13/2023, 10:13 AM

## 2023-05-02 NOTE — Progress Notes (Signed)
Note from Dr. Deno Etienne reviewed.  Patient has had a progressive decline and is now terminal.  Family has moved to comfort care measures.  We will sign off.

## 2023-05-02 NOTE — Progress Notes (Signed)
Pt was seen, full note will follow. Overnight events noted. Patient with progressive decline, necessitating bipap support and increased anxiolytics and opiate for air hunger. Initially unable to tolerate bipap, however, pt is now able to tolerate with morphine boluses.   When seen this AM, pt was lethargic with bipap in place. Pt continues to have increased respiratory effort. Daughter and granddaughter are at bedside and are aware that the prognosis is now terminal. Family states that pt's wishes at this time are to focus on comfort only. Will initiate morphine gtt and discontinue non-comfort related interventions at this time. Prognosis is likely hours.

## 2023-05-02 NOTE — Significant Event (Addendum)
Rapid Response Event Note   Reason for Call :  Respiratory distress, SpO2-mid 70s on 55L 100% HHFNC. Pt refusing bipap per staff.  RT at bedside adding  NRB to [facility].  Pt is DNR/DNI.  Initial Focused Assessment:  Pt lying in bed with eyes closed. He is in respiratroy distress, `+WOB, +accessory muscle use. Lungs with rhonchi/rales t/o. Pt will open eyes when asked but closes them quickly.  Skin hot to touch.   HR-132, BP-126/99, RR-35, SpO2-85% on 55L 100% HHFNC and NRB.   Pt given ativan and morphine PTA RRT.   Pt daughter notified PTA RRT and is on her way to the hospital.  Pt agreeable to wear bipap until dtr arrives.   Pt placed on .80 bipap with SpO2 increasing to 95% and WOB slightly better.   Interventions:  Bipap(already ordered) Morphine frequency increased.  Plan of Care:  Pt on max oxygen therapy possible with DNR/DNI status. He is still in distress despite bipap, morphine, ativan.  Await dtr arrival. TRH day shift MD to come see pt on first rounds. Please call RRT if further assistance needed.   Event Summary:   MD Notified: Dr. Arlean Hopping notified and came to bedside Call Time:0629 Arrival [id number] End [id number]  Terrilyn Saver, RN

## 2023-05-02 NOTE — Progress Notes (Signed)
Patient has passed away at 1912pm. Two verification RN [facility] and myself. Wasted 69mg  of morphine. Family at bedside. Giving time for the family to come say good-bye. Waiting for funeral home name and will send patient down to the morgue

## 2023-05-02 NOTE — Plan of Care (Signed)
Will continue to monitor patient.

## 2023-05-02 NOTE — Progress Notes (Signed)
TRH night cross cover note:   I was notified by patient's RN, that this patient, who was on comfort care measures only, has passed away and was pronounced by two RN's, with time of death noted to be 1912.    Newton Pigg, DO Hospitalist

## 2023-05-02 NOTE — Progress Notes (Signed)
CRITICAL VALUE STICKER  CRITICAL VALUE: Platelet count=14  RECEIVER (on-site recipient of call): Clydene Pugh, RN  DATE & TIME NOTIFIED: 04/20/2023 at 0444  MESSENGER (representative from lab): Valentina Shaggy  MD NOTIFIED: Newton Pigg, MD  TIME OF NOTIFICATION: [phone]  RESPONSE:  No new orders

## 2023-05-02 NOTE — Plan of Care (Signed)
  Problem: Metabolic: Goal: Ability to maintain appropriate glucose levels will improve 04/20/2023 0357 by Precious Bard, RN Outcome: Progressing 04/18/2023 0356 by Precious Bard, RN Outcome: Progressing   Problem: Nutritional: Goal: Maintenance of adequate nutrition will improve 04/11/2023 0357 by Precious Bard, RN Outcome: Progressing 04/18/2023 0356 by Precious Bard, RN Outcome: Progressing   Problem: Skin Integrity: Goal: Risk for impaired skin integrity will decrease Outcome: Progressing   Problem: Clinical Measurements: Goal: Respiratory complications will improve 04/10/2023 0357 by Precious Bard, RN Outcome: Progressing 04/10/2023 0356 by Precious Bard, RN Outcome: Progressing Goal: Cardiovascular complication will be avoided 04/11/2023 0357 by Precious Bard, RN Outcome: Progressing 04/20/2023 0356 by Precious Bard, RN Outcome: Progressing   Problem: Activity: Goal: Risk for activity intolerance will decrease Outcome: Progressing   Problem: Coping: Goal: Level of anxiety will decrease Outcome: Progressing   Problem: Education: Goal: Knowledge of disease or condition will improve 04/08/2023 0357 by Precious Bard, RN Outcome: Progressing 04/04/2023 0356 by Precious Bard, RN Outcome: Progressing Goal: Understanding of medication regimen will improve 04/24/2023 0357 by Precious Bard, RN Outcome: Progressing 04/07/2023 0356 by Precious Bard, RN Outcome: Progressing   Problem: Activity: Goal: Ability to tolerate increased activity will improve 04/30/2023 0357 by Precious Bard, RN Outcome: Progressing 04/23/2023 0356 by Precious Bard, RN Outcome: Progressing   Problem: Cardiac: Goal: Ability to achieve and maintain adequate cardiopulmonary perfusion will improve 04/28/2023 0357 by Precious Bard, RN Outcome: Progressing 04/18/2023 0356 by Precious Bard, RN Outcome: Progressing   Problem: Education: Goal: Knowledge of disease or condition will improve Outcome: Progressing Goal: Knowledge of the prescribed therapeutic regimen will improve Outcome: Progressing   Problem: Activity: Goal: Ability to tolerate increased activity will improve Outcome: Progressing   Problem: Respiratory: Goal: Ability to maintain a clear airway will improve Outcome: Progressing Goal: Levels of oxygenation will improve Outcome: Progressing Goal: Ability to maintain adequate ventilation will improve Outcome: Progressing

## 2023-05-02 NOTE — Progress Notes (Signed)
Rounding Note    Patient Name: Vincent Velez Date of Encounter: 04/08/2023  [facility] Cardiologist: Donato Schultz, MD   Subjective   Events noted. Pt with increased work of breathing overnight. Now on bipap.   Inpatient Medications    Scheduled Meds:  doxycycline  100 mg Oral Q12H   feeding supplement  237 mL Oral TID BM   insulin aspart  0-15 Units Subcutaneous TID WC   insulin aspart  0-5 Units Subcutaneous QHS   insulin glargine-yfgn  14 Units Subcutaneous QHS   methylPREDNISolone (SOLU-MEDROL) injection  40 mg Intravenous Q12H   metoprolol tartrate  50 mg Oral BID   multivitamin with minerals  1 tablet Oral Daily   sodium chloride flush  3 mL Intravenous Q12H   Continuous Infusions:  amiodarone 30 mg/hr (04/13/2023 0407)   piperacillin-tazobactam (ZOSYN)  IV 3.375 g (04/22/2023 0306)   vancomycin 1,000 mg (04/28/23 1155)   PRN Meds: acetaminophen, morphine injection, polyethylene glycol   Vital Signs    Vitals:   04/06/2023 0413 04/22/2023 0546 04/07/2023 0632 04/16/2023 0649  BP: (!) 116/90 (!) 136/97 (!) 126/99 (!) 121/99  Pulse: (!) 133 (!) 132 (!) 131 67  Resp: (!) 26 (!) 34 (!) 31 (!) 22  Temp: 97.9 F (36.6 C)  (!) 97.4 F (36.3 C)   TempSrc: Oral  Axillary   SpO2: 94% (!) 86% (!) 81% 94%  Weight:      Height:        Intake/Output Summary (Last 24 hours) at 04/25/2023 0731 Last data filed at 04/11/2023 0411 Gross per 24 hour  Intake 1353.56 ml  Output 2850 ml  Net -1496.44 ml      04/28/2023    8:52 AM 04/26/2023   12:46 PM 04/14/2023    3:18 PM  Last 3 Weights  Weight (lbs) 134 lb 14.7 oz 145 lb 146 lb 3.2 oz  Weight (kg) 61.2 kg 65.772 kg 66.316 kg      Telemetry    Atrial flutter, rates 130s - Personally Reviewed  ECG    No am tracing   Physical Exam   GEN: cachectic male in acute distress, bipap mask in place. Pt is not talking this am.  Cardiac: Irregular, tachy.   Psych: Pt not speaking this am.  Ext: No  edema  Labs    High Sensitivity Troponin:   Recent Labs  Lab 04/26/23 1255 04/26/23 1402  TROPONINIHS 36* 37*     Chemistry Recent Labs  Lab 04/26/23 1402 04/27/23 0247 04/28/23 0344 04/11/2023 0334  NA  --  135 136 133*  K  --  3.9 4.0 3.8  CL  --  105 102 99  CO2  --  19* 21* 23  GLUCOSE  --  205* 308* 235*  BUN  --  27* 44* 52*  CREATININE  --  0.96 1.23 1.00  CALCIUM  --  9.2 9.7 9.3  MG 2.1  --  2.4  --   PROT  --  7.1 7.5 7.5  ALBUMIN  --  2.8* 3.0* 2.8*  AST  --  38 44* 61*  ALT  --  17 20 26   ALKPHOS  --  84 118 167*  BILITOT  --  0.9 0.9 3.0*  GFRNONAA  --  >60 59* >60  ANIONGAP  --  11 13 11     Lipids No results for input(s): "CHOL", "TRIG", "HDL", "LABVLDL", "LDLCALC", "CHOLHDL" in the last 168 hours.  Hematology Recent Labs  Lab 04/27/23 0247 04/28/23 0344 04/30/2023 0334  WBC 8.7 17.8* 16.4*  RBC 3.81* 3.97* 3.96*  HGB 11.4* 11.8* 12.1*  HCT 35.5* 36.3* 35.8*  MCV 93.2 91.4 90.4  MCH 29.9 29.7 30.6  MCHC 32.1 32.5 33.8  RDW 15.8* 15.6* 15.9*  PLT 43* 36* 14*   Thyroid No results for input(s): "TSH", "FREET4" in the last 168 hours.  BNP Recent Labs  Lab 04/26/23 1402 04/27/23 0247  BNP 158.0* 138.8*    DDimer No results for input(s): "DDIMER" in the last 168 hours.   Radiology    ECHOCARDIOGRAM COMPLETE  Result Date: 04/27/2023    ECHOCARDIOGRAM REPORT   Patient Name:   Vincent Velez Date of Exam: 04/27/2023 Medical Rec #:  [id number]        Height:       69.0 in Accession #:    [id number]       Weight:       145.0 lb Date of Birth:  [birth year]        BSA:          1.802 m Patient Age:    82 years         BP:           106/74 mmHg Patient Gender: M                HR:           110 bpm. Exam Location:  Inpatient Procedure: 2D Echo, Cardiac Doppler, Color Doppler and Strain Analysis Indications:    Congestive Heart Failure  History:        Patient has prior history of Echocardiogram examinations, most                 recent 07/17/2020.  Arrythmias:Atrial Fibrillation; Risk                 Factors:Hypertension, Dyslipidemia, Diabetes and Former Smoker.                 Aortic Valve: 26 mm Sapien prosthetic, stented (TAVR) valve is                 present in the aortic position.  Sonographer:    Karma Ganja Referring Phys: [id number] Cecille Po MELVIN  Sonographer Comments: Technically difficult study due to poor echo windows. Image acquisition challenging due to respiratory motion. IMPRESSIONS  1. Left ventricular ejection fraction, by estimation, is 35 to 40%. The left ventricle has moderately decreased function. The left ventricle demonstrates global hypokinesis. There is mild concentric left ventricular hypertrophy. Left ventricular diastolic parameters are indeterminate.  2. Right ventricular systolic function is normal. The right ventricular size is mildly enlarged. There is normal pulmonary artery systolic pressure. The estimated right ventricular systolic pressure is 34.4 mmHg.  3. Left atrial size was severely dilated.  4. Right atrial size was mildly dilated.  5. The mitral valve is normal in structure. Mild mitral valve regurgitation. No evidence of mitral stenosis.  6. Bioprosthetic aortic valve s/p TAVR. 26 mm Edwards Sapien THV. There was mild-moderate peri-valvular leakage. Mean gradient across valve elevated at 19 mmHg with dimensionless index 0.25.  7. The inferior vena cava is normal in size with greater than 50% respiratory variability, suggesting right atrial pressure of 3 mmHg.  8. The patient was in atrial fibrillation with RVR. FINDINGS  Left Ventricle: Left ventricular ejection fraction, by estimation, is 35 to 40%. The left ventricle has moderately decreased function. The left ventricle demonstrates  global hypokinesis. Definity contrast agent was given IV to delineate the left ventricular endocardial borders. The left ventricular internal cavity size was normal in size. There is mild concentric left ventricular hypertrophy. Left  ventricular diastolic parameters are indeterminate. Right Ventricle: The right ventricular size is mildly enlarged. No increase in right ventricular wall thickness. Right ventricular systolic function is normal. There is normal pulmonary artery systolic pressure. The tricuspid regurgitant velocity is 2.80  m/s, and with an assumed right atrial pressure of 3 mmHg, the estimated right ventricular systolic pressure is 34.4 mmHg. Left Atrium: Left atrial size was severely dilated. Right Atrium: Right atrial size was mildly dilated. Pericardium: There is no evidence of pericardial effusion. Mitral Valve: The mitral valve is normal in structure. Mild mitral annular calcification. Mild mitral valve regurgitation. No evidence of mitral valve stenosis. Tricuspid Valve: The tricuspid valve is normal in structure. Tricuspid valve regurgitation is trivial. Aortic Valve: Bioprosthetic aortic valve s/p TAVR. 26 mm Edwards Sapien THV. There was mild-moderate peri-valvular leakage. Mean gradient across valve elevated at 19 mmHg with dimensionless index 0.25. The aortic valve has been repaired/replaced. Aortic valve regurgitation is mild to moderate. Aortic regurgitation PHT measures 170 msec. Aortic valve mean gradient measures 19.4 mmHg. Aortic valve peak gradient measures 32.9 mmHg. Aortic valve area, by VTI measures 1.33 cm. There is a 26 mm Sapien prosthetic, stented (TAVR) valve present in the aortic position. Pulmonic Valve: The pulmonic valve was normal in structure. Pulmonic valve regurgitation is not visualized. Aorta: The aortic root is normal in size and structure. Venous: The inferior vena cava is normal in size with greater than 50% respiratory variability, suggesting right atrial pressure of 3 mmHg. IAS/Shunts: No atrial level shunt detected by color flow Doppler.  LEFT VENTRICLE PLAX 2D LVIDd:         5.40 cm      Diastology LVIDs:         4.60 cm      LV e' medial:    9.28 cm/s LV PW:         1.10 cm      LV E/e'  medial:  9.4 LV IVS:        1.10 cm      LV e' lateral:   8.07 cm/s LVOT diam:     2.60 cm      LV E/e' lateral: 10.8 LV SV:         60 LV SV Index:   33 LVOT Area:     5.31 cm  LV Volumes (MOD) LV vol d, MOD A2C: 127.0 ml LV vol d, MOD A4C: 112.0 ml LV vol s, MOD A2C: 86.3 ml LV vol s, MOD A4C: 77.4 ml LV SV MOD A2C:     40.7 ml LV SV MOD A4C:     112.0 ml LV SV MOD BP:      40.3 ml RIGHT VENTRICLE             IVC RV Basal diam:  4.60 cm     IVC diam: 1.60 cm RV S prime:     20.40 cm/s TAPSE (M-mode): 2.4 cm LEFT ATRIUM              Index        RIGHT ATRIUM           Index LA diam:        4.20 cm  2.33 cm/m   RA Area:     14.70 cm LA Vol (A2C):  143.0 ml 79.35 ml/m  RA Volume:   32.00 ml  17.76 ml/m LA Vol (A4C):   66.5 ml  36.90 ml/m LA Biplane Vol: 99.1 ml  54.99 ml/m  AORTIC VALVE AV Area (Vmax):    1.24 cm AV Area (Vmean):   1.20 cm AV Area (VTI):     1.33 cm AV Vmax:           286.60 cm/s AV Vmean:          205.200 cm/s AV VTI:            0.450 m AV Peak Grad:      32.9 mmHg AV Mean Grad:      19.4 mmHg LVOT Vmax:         67.20 cm/s LVOT Vmean:        46.433 cm/s LVOT VTI:          0.113 m LVOT/AV VTI ratio: 0.25 AI PHT:            170 msec  AORTA Ao Root diam: 3.00 cm Ao Asc diam:  2.80 cm MITRAL VALVE               TRICUSPID VALVE MV Area (PHT): 6.67 cm    TR Peak grad:   31.4 mmHg MV Decel Time: 114 msec    TR Vmax:        280.00 cm/s MV E velocity: 86.86 cm/s                            SHUNTS                            Systemic VTI:  0.11 m                            Systemic Diam: 2.60 cm Dalton McleanMD Electronically signed by Wilfred Lacy Signature Date/Time: 04/27/2023/1:54:13 PM    Final     Cardiac Studies     Patient Profile     82 y.o. male with history of HTN, HLD, DM, GERD, PAD, COPD, atrial fib/flutter, aortic stenosis s/p TAVR, lung cancer admitted with acute respiratory failure, hemoptysis. CTA chest with with pneumonia vs ILD with increasing lymphadenopathy.  Cardiology consulted for management of atrial fibrillation with RVR.   Assessment & Plan    Atrial flutter with RVR: Rates difficult to control and being driven by his hypoxic respiratory failure. He appears to be approaching end of life given his worsened respiratory status over the past 24 hours. He is now on IV amiodarone with rates in the 130s. I will change his metoprolol to IV dosing today. Continue IV amiodarone.     Aortic stenosis s/p TAVR: Mild to moderate PVL with mean gradient 19 mmHg. Not contributing to his current respiratory failure.   Hypoxemic respiratory failure: Felt to be combination of progression of lung cancer, COPD, pneumonia and pneumonitis. He has been diuresed with no improvement in his respiratory status. He is being followed by the pulmonary team and is a DNR/DNI. It appears that with his worsened respiratory status today, the team may discuss moving to comfort care.    For questions or updates, please contact [facility] Please consult [email or web address] for contact info under        Signed, Verne Carrow, MD  04/17/2023, 7:31 AM

## 2023-05-02 DEATH — deceased

## 2023-05-06 ENCOUNTER — Other Ambulatory Visit (HOSPITAL_COMMUNITY): Payer: Self-pay | Admitting: Pharmacy Technician

## 2023-05-06 ENCOUNTER — Other Ambulatory Visit: Payer: Medicare Other

## 2023-05-06 ENCOUNTER — Other Ambulatory Visit (HOSPITAL_COMMUNITY): Payer: Self-pay

## 2023-05-06 ENCOUNTER — Ambulatory Visit (INDEPENDENT_AMBULATORY_CARE_PROVIDER_SITE_OTHER): Payer: Medicare Other

## 2023-05-06 ENCOUNTER — Ambulatory Visit: Payer: Medicare Other | Admitting: Physician Assistant

## 2023-05-06 NOTE — Progress Notes (Signed)
Disenrolled patient deceased
